# Patient Record
Sex: Male | Born: 1955 | Race: White | Hispanic: No | Marital: Married | Smoking: Never smoker
Health system: Southern US, Community
[De-identification: ages and names within clinical notes are randomized; demographics above are authoritative.]

## PROBLEM LIST (undated history)

## (undated) DIAGNOSIS — N281 Cyst of kidney, acquired: Secondary | ICD-10-CM

## (undated) DIAGNOSIS — E119 Type 2 diabetes mellitus without complications: Secondary | ICD-10-CM

## (undated) DIAGNOSIS — K5730 Diverticulosis of large intestine without perforation or abscess without bleeding: Secondary | ICD-10-CM

## (undated) DIAGNOSIS — J189 Pneumonia, unspecified organism: Secondary | ICD-10-CM

## (undated) DIAGNOSIS — Z992 Dependence on renal dialysis: Secondary | ICD-10-CM

## (undated) DIAGNOSIS — Z87442 Personal history of urinary calculi: Secondary | ICD-10-CM

## (undated) DIAGNOSIS — Z8619 Personal history of other infectious and parasitic diseases: Secondary | ICD-10-CM

## (undated) DIAGNOSIS — K219 Gastro-esophageal reflux disease without esophagitis: Secondary | ICD-10-CM

## (undated) DIAGNOSIS — R351 Nocturia: Secondary | ICD-10-CM

## (undated) DIAGNOSIS — Z960 Presence of urogenital implants: Secondary | ICD-10-CM

## (undated) DIAGNOSIS — I4891 Unspecified atrial fibrillation: Secondary | ICD-10-CM

## (undated) DIAGNOSIS — D649 Anemia, unspecified: Secondary | ICD-10-CM

## (undated) DIAGNOSIS — K760 Fatty (change of) liver, not elsewhere classified: Secondary | ICD-10-CM

## (undated) DIAGNOSIS — G4733 Obstructive sleep apnea (adult) (pediatric): Secondary | ICD-10-CM

## (undated) DIAGNOSIS — C801 Malignant (primary) neoplasm, unspecified: Secondary | ICD-10-CM

## (undated) DIAGNOSIS — N2889 Other specified disorders of kidney and ureter: Secondary | ICD-10-CM

## (undated) DIAGNOSIS — I10 Essential (primary) hypertension: Secondary | ICD-10-CM

## (undated) DIAGNOSIS — R911 Solitary pulmonary nodule: Secondary | ICD-10-CM

## (undated) DIAGNOSIS — I739 Peripheral vascular disease, unspecified: Secondary | ICD-10-CM

## (undated) DIAGNOSIS — K869 Disease of pancreas, unspecified: Secondary | ICD-10-CM

## (undated) DIAGNOSIS — K8020 Calculus of gallbladder without cholecystitis without obstruction: Secondary | ICD-10-CM

## (undated) DIAGNOSIS — I499 Cardiac arrhythmia, unspecified: Secondary | ICD-10-CM

## (undated) DIAGNOSIS — K589 Irritable bowel syndrome without diarrhea: Secondary | ICD-10-CM

## (undated) DIAGNOSIS — M1990 Unspecified osteoarthritis, unspecified site: Secondary | ICD-10-CM

## (undated) DIAGNOSIS — Z86711 Personal history of pulmonary embolism: Secondary | ICD-10-CM

## (undated) DIAGNOSIS — N184 Chronic kidney disease, stage 4 (severe): Secondary | ICD-10-CM

## (undated) DIAGNOSIS — Z9989 Dependence on other enabling machines and devices: Secondary | ICD-10-CM

## (undated) DIAGNOSIS — N186 End stage renal disease: Secondary | ICD-10-CM

## (undated) HISTORY — PX: COLONOSCOPY: SHX174

## (undated) HISTORY — PX: TOE SURGERY: SHX1073

## (undated) HISTORY — PX: CYSTOSCOPY/RETROGRADE/URETEROSCOPY/STONE EXTRACTION WITH BASKET: SHX5317

## (undated) HISTORY — PX: EXTRACORPOREAL SHOCK WAVE LITHOTRIPSY: SHX1557

## (undated) HISTORY — PX: UPPER GI ENDOSCOPY: SHX6162

## (undated) HISTORY — PX: OTHER SURGICAL HISTORY: SHX169

---

## 2008-03-29 DIAGNOSIS — Z86711 Personal history of pulmonary embolism: Secondary | ICD-10-CM

## 2008-03-29 HISTORY — DX: Personal history of pulmonary embolism: Z86.711

## 2017-10-16 ENCOUNTER — Other Ambulatory Visit: Payer: Self-pay | Admitting: Nephrology

## 2017-10-16 DIAGNOSIS — R399 Unspecified symptoms and signs involving the genitourinary system: Secondary | ICD-10-CM

## 2017-10-16 DIAGNOSIS — N183 Chronic kidney disease, stage 3 unspecified: Secondary | ICD-10-CM

## 2017-10-16 DIAGNOSIS — N200 Calculus of kidney: Secondary | ICD-10-CM

## 2017-11-13 ENCOUNTER — Ambulatory Visit
Admission: RE | Admit: 2017-11-13 | Discharge: 2017-11-13 | Disposition: A | Discharge status: Home / Self Care | Payer: PRIVATE HEALTH INSURANCE | Source: Ambulatory Visit | Attending: Nephrology | Admitting: Nephrology

## 2017-11-13 DIAGNOSIS — N183 Chronic kidney disease, stage 3 unspecified: Secondary | ICD-10-CM

## 2017-11-13 DIAGNOSIS — R399 Unspecified symptoms and signs involving the genitourinary system: Secondary | ICD-10-CM

## 2017-11-13 DIAGNOSIS — N200 Calculus of kidney: Secondary | ICD-10-CM

## 2018-03-15 ENCOUNTER — Other Ambulatory Visit: Payer: Self-pay | Admitting: Nephrology

## 2018-03-15 DIAGNOSIS — N183 Chronic kidney disease, stage 3 unspecified: Secondary | ICD-10-CM

## 2018-03-15 DIAGNOSIS — N200 Calculus of kidney: Secondary | ICD-10-CM

## 2018-03-15 DIAGNOSIS — R399 Unspecified symptoms and signs involving the genitourinary system: Secondary | ICD-10-CM

## 2018-03-19 ENCOUNTER — Ambulatory Visit
Admission: RE | Admit: 2018-03-19 | Discharge: 2018-03-19 | Disposition: A | Discharge status: Home / Self Care | Payer: PRIVATE HEALTH INSURANCE | Source: Ambulatory Visit | Attending: Nephrology | Admitting: Nephrology

## 2018-03-19 DIAGNOSIS — N183 Chronic kidney disease, stage 3 unspecified: Secondary | ICD-10-CM

## 2018-03-19 DIAGNOSIS — K8020 Calculus of gallbladder without cholecystitis without obstruction: Secondary | ICD-10-CM

## 2018-03-19 DIAGNOSIS — N200 Calculus of kidney: Secondary | ICD-10-CM

## 2018-03-19 DIAGNOSIS — R399 Unspecified symptoms and signs involving the genitourinary system: Secondary | ICD-10-CM

## 2018-03-19 DIAGNOSIS — K760 Fatty (change of) liver, not elsewhere classified: Secondary | ICD-10-CM

## 2018-03-19 HISTORY — DX: Calculus of gallbladder without cholecystitis without obstruction: K80.20

## 2018-03-19 HISTORY — DX: Fatty (change of) liver, not elsewhere classified: K76.0

## 2018-03-20 ENCOUNTER — Other Ambulatory Visit: Payer: Self-pay | Admitting: Nephrology

## 2018-03-20 DIAGNOSIS — N2889 Other specified disorders of kidney and ureter: Secondary | ICD-10-CM

## 2018-03-23 ENCOUNTER — Ambulatory Visit
Admission: RE | Admit: 2018-03-23 | Discharge: 2018-03-23 | Disposition: A | Discharge status: Home / Self Care | Payer: PRIVATE HEALTH INSURANCE | Source: Ambulatory Visit | Attending: Nephrology | Admitting: Nephrology

## 2018-03-23 DIAGNOSIS — N2889 Other specified disorders of kidney and ureter: Secondary | ICD-10-CM

## 2018-03-23 DIAGNOSIS — N281 Cyst of kidney, acquired: Secondary | ICD-10-CM

## 2018-03-23 HISTORY — DX: Cyst of kidney, acquired: N28.1

## 2018-03-28 ENCOUNTER — Other Ambulatory Visit: Payer: Self-pay | Admitting: Urology

## 2018-03-28 ENCOUNTER — Ambulatory Visit (HOSPITAL_COMMUNITY)
Admission: RE | Admit: 2018-03-28 | Discharge: 2018-03-28 | Disposition: A | Discharge status: Home / Self Care | Payer: PRIVATE HEALTH INSURANCE | Source: Ambulatory Visit | Attending: Urology | Admitting: Urology

## 2018-03-28 DIAGNOSIS — D49511 Neoplasm of unspecified behavior of right kidney: Secondary | ICD-10-CM | POA: Diagnosis not present

## 2018-03-28 DIAGNOSIS — N2889 Other specified disorders of kidney and ureter: Secondary | ICD-10-CM

## 2018-04-02 ENCOUNTER — Other Ambulatory Visit: Payer: Self-pay | Admitting: Urology

## 2018-04-02 DIAGNOSIS — N200 Calculus of kidney: Secondary | ICD-10-CM

## 2018-04-02 DIAGNOSIS — D3001 Benign neoplasm of right kidney: Secondary | ICD-10-CM

## 2018-04-06 ENCOUNTER — Other Ambulatory Visit: Payer: Self-pay | Admitting: Radiology

## 2018-04-09 ENCOUNTER — Ambulatory Visit (HOSPITAL_COMMUNITY)
Admission: RE | Admit: 2018-04-09 | Discharge: 2018-04-09 | Disposition: A | Discharge status: Home / Self Care | Payer: PRIVATE HEALTH INSURANCE | Source: Ambulatory Visit | Attending: Urology | Admitting: Urology

## 2018-04-09 ENCOUNTER — Encounter (HOSPITAL_COMMUNITY): Payer: Self-pay

## 2018-04-09 DIAGNOSIS — Z86711 Personal history of pulmonary embolism: Secondary | ICD-10-CM | POA: Diagnosis not present

## 2018-04-09 DIAGNOSIS — N183 Chronic kidney disease, stage 3 (moderate): Secondary | ICD-10-CM | POA: Insufficient documentation

## 2018-04-09 DIAGNOSIS — N2889 Other specified disorders of kidney and ureter: Secondary | ICD-10-CM | POA: Diagnosis present

## 2018-04-09 DIAGNOSIS — Z79899 Other long term (current) drug therapy: Secondary | ICD-10-CM | POA: Diagnosis not present

## 2018-04-09 DIAGNOSIS — N281 Cyst of kidney, acquired: Secondary | ICD-10-CM | POA: Insufficient documentation

## 2018-04-09 DIAGNOSIS — R911 Solitary pulmonary nodule: Secondary | ICD-10-CM | POA: Diagnosis not present

## 2018-04-09 DIAGNOSIS — K760 Fatty (change of) liver, not elsewhere classified: Secondary | ICD-10-CM | POA: Insufficient documentation

## 2018-04-09 DIAGNOSIS — C641 Malignant neoplasm of right kidney, except renal pelvis: Secondary | ICD-10-CM | POA: Diagnosis not present

## 2018-04-09 DIAGNOSIS — Z880 Allergy status to penicillin: Secondary | ICD-10-CM | POA: Diagnosis not present

## 2018-04-09 DIAGNOSIS — D49511 Neoplasm of unspecified behavior of right kidney: Secondary | ICD-10-CM

## 2018-04-09 DIAGNOSIS — R310 Gross hematuria: Secondary | ICD-10-CM | POA: Insufficient documentation

## 2018-04-09 DIAGNOSIS — K8020 Calculus of gallbladder without cholecystitis without obstruction: Secondary | ICD-10-CM | POA: Insufficient documentation

## 2018-04-09 DIAGNOSIS — K862 Cyst of pancreas: Secondary | ICD-10-CM | POA: Insufficient documentation

## 2018-04-09 DIAGNOSIS — G4733 Obstructive sleep apnea (adult) (pediatric): Secondary | ICD-10-CM | POA: Insufficient documentation

## 2018-04-09 DIAGNOSIS — Z87442 Personal history of urinary calculi: Secondary | ICD-10-CM | POA: Diagnosis not present

## 2018-04-09 DIAGNOSIS — K219 Gastro-esophageal reflux disease without esophagitis: Secondary | ICD-10-CM | POA: Diagnosis not present

## 2018-04-09 DIAGNOSIS — I129 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 kidney disease, or unspecified chronic kidney disease: Secondary | ICD-10-CM | POA: Diagnosis not present

## 2018-04-09 HISTORY — DX: Dependence on other enabling machines and devices: Z99.89

## 2018-04-09 HISTORY — DX: Gastro-esophageal reflux disease without esophagitis: K21.9

## 2018-04-09 HISTORY — DX: Personal history of urinary calculi: Z87.442

## 2018-04-09 HISTORY — DX: Essential (primary) hypertension: I10

## 2018-04-09 HISTORY — DX: Obstructive sleep apnea (adult) (pediatric): G47.33

## 2018-04-09 LAB — CBC
HCT: 39.1 % (ref 39.0–52.0)
Hemoglobin: 12.5 g/dL — ABNORMAL LOW (ref 13.0–17.0)
MCH: 27.7 pg (ref 26.0–34.0)
MCHC: 32 g/dL (ref 30.0–36.0)
MCV: 86.7 fL (ref 78.0–100.0)
PLATELETS: 254 10*3/uL (ref 150–400)
RBC: 4.51 MIL/uL (ref 4.22–5.81)
RDW: 17.2 % — AB (ref 11.5–15.5)
WBC: 12 10*3/uL — ABNORMAL HIGH (ref 4.0–10.5)

## 2018-04-09 LAB — PROTIME-INR
INR: 1.08
PROTHROMBIN TIME: 13.9 s (ref 11.4–15.2)

## 2018-04-09 MED ORDER — MIDAZOLAM HCL 2 MG/2ML IJ SOLN
INTRAMUSCULAR | Status: AC | PRN
  Administered 2018-04-09 (×2): 0.5 mg via INTRAVENOUS
  Administered 2018-04-09: 1 mg via INTRAVENOUS

## 2018-04-09 MED ORDER — FENTANYL CITRATE (PF) 100 MCG/2ML IJ SOLN
INTRAMUSCULAR | Status: AC | PRN
  Administered 2018-04-09: 50 ug via INTRAVENOUS
  Administered 2018-04-09 (×2): 25 ug via INTRAVENOUS

## 2018-04-09 MED ORDER — FENTANYL CITRATE (PF) 100 MCG/2ML IJ SOLN
INTRAMUSCULAR | Status: AC
  Filled 2018-04-09: qty 4

## 2018-04-09 MED ORDER — GELATIN ABSORBABLE 12-7 MM EX MISC
CUTANEOUS | Status: AC
  Filled 2018-04-09: qty 1

## 2018-04-09 MED ORDER — ACETAMINOPHEN 500 MG PO TABS
1000.0000 mg | ORAL_TABLET | Freq: Once | ORAL | Status: AC
  Administered 2018-04-09: 1000 mg via ORAL
  Filled 2018-04-09: qty 2

## 2018-04-09 MED ORDER — LIDOCAINE HCL (PF) 1 % IJ SOLN
INTRAMUSCULAR | Status: AC
  Filled 2018-04-09: qty 30

## 2018-04-09 MED ORDER — SODIUM CHLORIDE 0.9 % IV SOLN: INTRAVENOUS | Status: DC

## 2018-04-09 MED ORDER — MIDAZOLAM HCL 2 MG/2ML IJ SOLN
INTRAMUSCULAR | Status: AC
  Filled 2018-04-09: qty 4

## 2018-04-09 NOTE — Discharge Instructions (Signed)
Percutaneous Kidney Biopsy A kidney biopsy is a procedure to remove small pieces of tissue from a kidney. In a percutaneous biopsy, the tissue is removed using a needle that is inserted through the skin. This procedure is done so that the tissue can be examined under a microscope and checked for disease or infection. Tell a health care provider about:  Any allergies you have.  All medicines you are taking, including vitamins, herbs, eye drops, creams, and over-the-counter medicines.  Any problems you or family members have had with anesthetic medicines.  Any blood disorders you have.  Any surgeries you have had.  Any medical conditions you have.  Whether you are pregnant or may be pregnant. What are the risks? Generally, this is a safe procedure. However, problems may occur, including:  Infection.  Bleeding.  Allergic reactions to medicines.  Damage to other structures or organs.  Swelling from a collection of clotted blood outside a blood vessel (hematoma).  Blood in the urine (hematuria).  What happens before the procedure?  Follow instructions from your health care provider about eating or drinking restrictions.  Ask your health care provider about: ? Changing or stopping your regular medicines. This is especially important if you are taking diabetes medicines or blood thinners. ? Taking medicines such as aspirin and ibuprofen. These medicines can thin your blood. Do not take these medicines before your procedure if your health care provider instructs you not to.  You may be given antibiotic medicine to help prevent infection.  You will have blood and urine samples taken. This is to make sure that you do not have a condition where you should not have a biopsy.  Plan to have someone take you home from the hospital or clinic.  Ask your health care provider how your biopsy site will be marked or identified. What happens during the procedure?  To lower your risk of  infection: ? Your health care team will wash or sanitize their hands. ? Your skin will be washed with soap.  An IV tube will be inserted into one of your veins.  You will be given one or more of the following: ? A medicine to help you relax (sedative). ? A medicine to numb the area (local anesthetic).  You will lie on your abdomen. A firm pillow will be placed under your body to help push the kidneys closer to the surface of the skin. If you have a transplanted kidney, you will lie on your back.  The health care provider will mark the area where the needle will enter your skin.  An imaging test--such as an ultrasound, X-ray, CT scan, or MRI--will be used to locate the kidney. These images will also help the health care provider to guide the biopsy needle into the kidney.  You will be asked to hold your breath and stay still while the health care provider inserts the needle and removes the kidney tissue. ? You will need to hold your breath and stay still for 30-45 seconds. ? During the biopsy, you may hear a popping sound from the needle. ? You may also feel some pressure from the area where the needle is being inserted.  The needle may be inserted and removed 3 or 4 times to make sure that enough tissue is taken for testing.  A bandage (dressing) may be placed over the spot where the needle entered your skin (biopsy site). The procedure may vary among health care providers and hospitals. What happens after the procedure?  Your blood pressure, heart rate, breathing rate, and blood oxygen level will be monitored until the medicines you were given have worn off.  You will need to lie on your back for 6-8 hours.  You may have some pain or soreness near the biopsy site.  You may have pink or cloudy urine from small amounts of blood. This is normal.  You may have grogginess or fatigue if you were given a sedative.  Do not drive for 24 hours if you were given a sedative.  It is up to  you to get the results of your procedure. Ask your health care provider, or the department performing the procedure, when your results will be ready. This information is not intended to replace advice given to you by your health care provider. Make sure you discuss any questions you have with your health care provider. Document Released: 06/25/2004 Document Revised: 05/27/2016 Document Reviewed: 05/27/2016 Elsevier Interactive Patient Education  2018 [place]. Moderate Conscious Sedation, Adult, Care After These instructions provide you with information about caring for yourself after your procedure. Your health care provider may also give you more specific instructions. Your treatment has been planned according to current medical practices, but problems sometimes occur. Call your health care provider if you have any problems or questions after your procedure. What can I expect after the procedure? After your procedure, it is common:  To feel sleepy for several hours.  To feel clumsy and have poor balance for several hours.  To have poor judgment for several hours.  To vomit if you eat too soon.  Follow these instructions at home: For at least 24 hours after the procedure:   Do not: ? Participate in activities where you could fall or become injured. ? Drive. ? Use heavy machinery. ? Drink alcohol. ? Take sleeping pills or medicines that cause drowsiness. ? Make important decisions or sign legal documents. ? Take care of children on your own.  Rest. Eating and drinking  Follow the diet recommended by your health care provider.  If you vomit: ? Drink water, juice, or soup when you can drink without vomiting. ? Make sure you have little or no nausea before eating solid foods. General instructions  Have a responsible adult stay with you until you are awake and alert.  Take over-the-counter and prescription medicines only as told by your health care provider.  If you  smoke, do not smoke without supervision.  Keep all follow-up visits as told by your health care provider. This is important. Contact a health care provider if:  You keep feeling nauseous or you keep vomiting.  You feel light-headed.  You develop a rash.  You have a fever. Get help right away if:  You have trouble breathing. This information is not intended to replace advice given to you by your health care provider. Make sure you discuss any questions you have with your health care provider. Document Released: 06/05/2013 Document Revised: 01/18/2016 Document Reviewed: 12/05/2015 Elsevier Interactive Patient Education  [employer].

## 2018-04-09 NOTE — Procedures (Signed)
Interventional Radiology Procedure Note  Procedure: US guided right kidney mass biopsy.  .  Complications: None Recommendations:  - Ok to shower tomorrow - Do not submerge for 7 days - Routine care   Signed,  Dulcy Fanny. Earleen Newport, DO

## 2018-04-09 NOTE — H&P (Signed)
Chief Complaint: Patient was seen in consultation today for right nal mass biopsy at the request of Bell,Eugene D III  Referring Physician(s): Bell,Eugene D III  Supervising Physician: Sandi Mariscal  Patient Status: [facility] - Out-pt  History of Present Illness: George Lewis is a 63 y.o. male   Known CKD per chart; renal stones multiple times Follows with Dr Leisa Lenz hematuria x months  Referred to Dr Phillis Knack CT 7/22: IMPRESSION: 1. Indeterminate hypodense 9.1 cm renal cortical mass in the posterior upper right kidney. Renal cell carcinoma not excluded. Recommend further evaluation with MRI (preferred) or CT abdomen without and with IV contrast. 2. Distal left ureteral 3 mm stone (located approximately 1.5 cm above the left UVJ). Normal caliber left ureter. No hydronephrosis. 3. Punctate nonobstructing stones in both kidneys. 4. Moderate left colonic diverticulosis. 5. Mild diffuse hepatic steatosis. 6. Solid 2 mm right lower lobe pulmonary nodule. If the right renal mass proves to represent a malignancy, follow-up chest CT would be advised in 3 months. Otherwise, follow-up chest CT could be obtained in 12 months if the patient has risk factors for lung malignancy. 7. Cholelithiasis.  MR 7/26:  IMPRESSION: 1. Large 10.9 x 6.7 x 8.8 cm renal cortical mass in the posterior upper right kidney with irregular outer contour and markedly heterogeneous T2 signal intensity, incompletely characterized on this noncontrast MRI study. This mass is highly suspicious for renal cell carcinoma, which should be considered the diagnosis of exclusion. Percutaneous image guided biopsy may be necessary to establish the diagnosis if the patient's poor renal function (GFR < 30) continues to preclude a contrast-enhanced MRI or CT abdomen without and with IV contrast. 2. No evidence of metastatic disease in the abdomen. 3. Indeterminate small round 1.1 cm structure in the medial pancreatic  neck, incompletely characterized on this noncontrast MRI study. Differential includes a peripancreatic lymph node or solid pancreatic neoplasm. Suggest follow-up noncontrast MRI abdomen in 3-6 months to evaluate stability of this lesion assuming MRI abdomen without and with IV contrast is precluded by poor renal function. 4. Tiny 0.4 cm cystic pancreatic tail lesion without aggressive features. MRI abdomen follow-up suggested in 1 year for this lesion.  Request for right renal mass biopsy   Past Medical History:  Diagnosis Date  . GERD (gastroesophageal reflux disease)   . History of kidney stones   . Hypertension   . Kidney stone   . OSA on CPAP   . PE (pulmonary thromboembolism) ([place])     Past Surgical History:  Procedure Laterality Date  . TOE SURGERY      Allergies: Penicillins  Medications: Prior to Admission medications   Medication Sig Start Date End Date Taking? Authorizing Provider  acetaminophen (TYLENOL) 500 MG tablet Take 1,000 mg by mouth every 8 (eight) hours as needed.   Yes [provider]  allopurinol (ZYLOPRIM) 300 MG tablet Take 300 mg by mouth daily. 03/08/18  Yes [provider]  diphenhydramine-acetaminophen (TYLENOL PM) 25-500 MG TABS tablet Take 3 tablets by mouth at bedtime as needed (pain).   Yes [provider]  omeprazole (PRILOSEC OTC) 20 MG tablet Take 20 mg by mouth daily.   Yes [provider]  Potassium Citrate 15 MEQ (1620 MG) TBCR Take 1 tablet by mouth daily. 03/22/18  Yes [provider]  tamsulosin (FLOMAX) 0.4 MG CAPS capsule Take 0.4 mg by mouth daily. 03/08/18  Yes [provider]     Family History  Problem Relation Age of Onset  .  Alzheimer's disease Mother   . Alzheimer's disease Father   . Heart disease Father     Social History   Socioeconomic History  . Marital status: Married    Spouse name: Not on file  . Number of children: Not on file  . Years of education: Not on  file  . Highest education level: Not on file  Occupational History  . Not on file  Social Needs  . Financial resource strain: Not on file  . Food insecurity:    Worry: Not on file    Inability: Not on file  . Transportation needs:    Medical: Not on file    Non-medical: Not on file  Tobacco Use  . Smoking status: Never Smoker  . Smokeless tobacco: Never Used  Substance and Sexual Activity  . Alcohol use: Never    Frequency: Never  . Drug use: Never  . Sexual activity: Not on file  Lifestyle  . Physical activity:    Days per week: Not on file    Minutes per session: Not on file  . Stress: Not on file  Relationships  . Social connections:    Talks on phone: Not on file    Gets together: Not on file    Attends religious service: Not on file    Active member of club or organization: Not on file    Attends meetings of clubs or organizations: Not on file    Relationship status: Not on file  Other Topics Concern  . Not on file  Social History Narrative  . Not on file     Review of Systems: A 12 point ROS discussed and pertinent positives are indicated in the HPI above.  All other systems are negative.  Review of Systems  Constitutional: Negative for activity change, fatigue and unexpected weight change.  Respiratory: Negative for cough and shortness of breath.   Cardiovascular: Negative for chest pain.  Gastrointestinal: Negative for abdominal pain.  Genitourinary: Positive for hematuria.  Musculoskeletal: Negative for back pain.  Psychiatric/Behavioral: Negative for confusion and decreased concentration.    Vital Signs: BP 138/83   Pulse (!) 106   Temp 98.4 F (36.9 C) (Oral)   Resp 20   Ht 6\' 1"  (1.854 m)   Wt (!) 315 lb (142.9 kg)   SpO2 100%   BMI 41.56 kg/m   Physical Exam  Constitutional: He is oriented to person, place, and time.  Cardiovascular: Normal rate, regular rhythm and normal heart sounds.  Pulmonary/Chest: Effort normal and breath sounds  normal.  Abdominal: Soft. Bowel sounds are normal. There is no tenderness.  Musculoskeletal: Normal range of motion.  Neurological: He is alert and oriented to person, place, and time.  Skin: Skin is warm and dry.  Psychiatric: He has a normal mood and affect. His behavior is normal. Judgment and thought content normal.  Nursing note and vitals reviewed.   Imaging: Ct Abdomen Pelvis Wo Contrast  Result Date: 03/19/2018 CLINICAL DATA:  Chronic kidney disease, stage III. Urinary tract infection symptoms. Gross hematuria. History nephrolithiasis. EXAM: CT ABDOMEN AND PELVIS WITHOUT CONTRAST TECHNIQUE: Multidetector CT imaging of the abdomen and pelvis was performed following the standard protocol without IV contrast. COMPARISON:  11/13/2017 renal sonogram. FINDINGS: Lower chest: Solid 2 mm right lower lobe pulmonary nodule (series 4/image 6). Hepatobiliary: Mild diffuse hepatic steatosis. No definite liver surface irregularity. No liver mass. Cholelithiasis. No gallbladder wall thickening or pericholecystic fluid. No biliary ductal dilatation. Pancreas: Normal, with no mass or duct  dilation. Spleen: Normal size. No mass. Adrenals/Urinary Tract: Normal adrenals. There is a 9.1 x 6.6 cm renal cortical mass with indeterminate density 28 HU (series 2/image 40) in the posterior upper right kidney. Simple 2.1 cm renal cyst in the posterior lower left kidney. No hydronephrosis. Tiny nonobstructing stones in the upper right kidney and scattered throughout left kidney, largest 3 mm in the upper left kidney. Normal caliber ureters. Distal left ureter 3 mm stone located approximately 1.5 cm above the left ureterovesical junction. Normal bladder with no bladder stones. Stomach/Bowel: Normal non-distended stomach. Normal caliber small bowel with no small bowel wall thickening. Normal appendix. Moderate left colonic diverticulosis, most prominent in the sigmoid colon, with no large bowel wall thickening or pericolonic  fat stranding. Vascular/Lymphatic: Normal caliber abdominal aorta. No pathologically enlarged lymph nodes in the abdomen or pelvis. Reproductive: Normal size prostate. Other: No pneumoperitoneum, ascites or focal fluid collection. Musculoskeletal: No aggressive appearing focal osseous lesions. Moderate thoracolumbar spondylosis. IMPRESSION: 1. Indeterminate hypodense 9.1 cm renal cortical mass in the posterior upper right kidney. Renal cell carcinoma not excluded. Recommend further evaluation with MRI (preferred) or CT abdomen without and with IV contrast. 2. Distal left ureteral 3 mm stone (located approximately 1.5 cm above the left UVJ). Normal caliber left ureter. No hydronephrosis. 3. Punctate nonobstructing stones in both kidneys. 4. Moderate left colonic diverticulosis. 5. Mild diffuse hepatic steatosis. 6. Solid 2 mm right lower lobe pulmonary nodule. If the right renal mass proves to represent a malignancy, follow-up chest CT would be advised in 3 months. Otherwise, follow-up chest CT could be obtained in 12 months if the patient has risk factors for lung malignancy. 7. Cholelithiasis. These results will be called to the ordering clinician or representative by the Radiologist Assistant, and communication documented in the PACS or zVision Dashboard. Electronically Signed   By: Ilona Sorrel M.D.   On: 03/19/2018 09:33   Dg Chest 2 View  Result Date: 03/28/2018 CLINICAL DATA:  Right-sided renal cell carcinoma. EXAM: CHEST - 2 VIEW COMPARISON:  None. FINDINGS: The heart size and mediastinal contours are within normal limits. Both lungs are clear. The visualized skeletal structures are unremarkable. IMPRESSION: No active cardiopulmonary disease. Electronically Signed   By: Earle Gell M.D.   On: 03/28/2018 10:30   Mr Abdomen Wo Contrast  Result Date: 03/24/2018 CLINICAL DATA:  Indeterminate upper right renal mass on recent noncontrast CT study. EXAM: MRI ABDOMEN WITHOUT CONTRAST TECHNIQUE: Multiplanar  multisequence MR imaging was performed without the administration of intravenous contrast. COMPARISON:  03/19/2018 unenhanced CT abdomen/pelvis. FINDINGS: Lower chest: No acute abnormality at the lung bases. Hepatobiliary: Normal liver size and configuration. Diffuse hepatic steatosis on chemical shift imaging. There is a tiny 0.3 cm T2 hyperintense lesion in the posterior right liver lobe (series 10/image 29), probably a simple cyst. No additional liver lesions. There is a layering 3 mm gallstone in the nondistended gallbladder, with no gallbladder wall thickening or pericholecystic fluid. No biliary ductal dilatation. Common bile duct diameter 3 mm. No choledocholithiasis. No biliary strictures or masses. Pancreas: Diffuse heterogeneous fatty infiltration of the pancreas. There is a round 1.1 cm structure in the medial pancreatic neck (series 6/image 30), which demonstrates intermediate T1 and T2 signal intensity, incompletely characterized on this noncontrast scan. There is a unilocular 0.4 cm tiny cystic pancreatic tail lesion without appreciable wall thickening or internal septations (series 8/image 27). No pancreatic duct dilation. No pancreas divisum. Spleen: Normal size. No mass. Adrenals/Urinary Tract: Normal adrenals. No hydronephrosis. There  is a large 10.9 x 6.7 x 8.8 cm renal cortical mass in the posterior upper right kidney (series 10/image 42), which demonstrates an irregular outer contour and markedly heterogeneous T2 signal intensity. This mass appears to invade the upper right renal collecting system (series 6/images 44 and 45). Simple appearing 2.1 cm interpolar left renal cyst. Stomach/Bowel: Normal non-distended stomach. Visualized small and large bowel is normal caliber, with no bowel wall thickening. Scattered colonic diverticulosis. Vascular/Lymphatic: Normal caliber abdominal aorta. No pathologically enlarged lymph nodes in the abdomen. Other: No abdominal ascites or focal fluid collection.  Musculoskeletal: No aggressive appearing focal osseous lesions. IMPRESSION: 1. Large 10.9 x 6.7 x 8.8 cm renal cortical mass in the posterior upper right kidney with irregular outer contour and markedly heterogeneous T2 signal intensity, incompletely characterized on this noncontrast MRI study. This mass is highly suspicious for renal cell carcinoma, which should be considered the diagnosis of exclusion. Percutaneous image guided biopsy may be necessary to establish the diagnosis if the patient's poor renal function (GFR < 30) continues to preclude a contrast-enhanced MRI or CT abdomen without and with IV contrast. 2. No evidence of metastatic disease in the abdomen. 3. Indeterminate small round 1.1 cm structure in the medial pancreatic neck, incompletely characterized on this noncontrast MRI study. Differential includes a peripancreatic lymph node or solid pancreatic neoplasm. Suggest follow-up noncontrast MRI abdomen in 3-6 months to evaluate stability of this lesion assuming MRI abdomen without and with IV contrast is precluded by poor renal function. 4. Tiny 0.4 cm cystic pancreatic tail lesion without aggressive features. MRI abdomen follow-up suggested in 1 year for this lesion. This recommendation follows ACR consensus guidelines: Management of Incidental Pancreatic Cysts: A White Paper of the ACR Incidental Findings Committee. J Am Coll Radiol 9326;71:245-809. 5. Diffuse hepatic steatosis. 6. Cholelithiasis. No evidence of acute cholecystitis. No biliary ductal dilatation. No choledocholithiasis. 7. Colonic diverticulosis. These results will be called to the ordering clinician or representative by the Radiologist Assistant, and communication documented in the PACS or zVision Dashboard. Electronically Signed   By: Ilona Sorrel M.D.   On: 03/24/2018 10:18    Labs:  CBC: Recent Labs    04/09/18 0605  WBC 12.0*  HGB 12.5*  HCT 39.1  PLT 254    COAGS: Recent Labs    04/09/18 0605  INR 1.08     BMP: No results for input(s): NA, K, CL, CO2, GLUCOSE, BUN, CALCIUM, CREATININE, GFRNONAA, GFRAA in the last 8760 hours.  Invalid input(s): CMP  LIVER FUNCTION TESTS: No results for input(s): BILITOT, AST, ALT, ALKPHOS, PROT, ALBUMIN in the last 8760 hours.  TUMOR MARKERS: No results for input(s): AFPTM, CEA, CA199, CHROMGRNA in the last 8760 hours.  Assessment and Plan:  CKD III Known renal stones multiple times Persistent gross hematuria Imaging revealing Rt renal mass Now scheduled for renal mass biopsy Risks and benefits discussed with the patient including, but not limited to bleeding, infection, damage to adjacent structures or low yield requiring additional tests.  All of the patient's questions were answered, patient is agreeable to proceed. Consent signed and in chart.    Thank you for this interesting consult.  I greatly enjoyed meeting George Lewis and look forward to participating in their care.  A copy of this report was sent to the requesting provider on this date.  Electronically Signed: Lavonia Drafts, PA-C 04/09/2018, 7:00 AM   I spent a total of  30 Minutes   in face to face in clinical consultation, greater  than 50% of which was counseling/coordinating care for right renal mass bx

## 2018-04-10 DIAGNOSIS — N2889 Other specified disorders of kidney and ureter: Secondary | ICD-10-CM

## 2018-04-10 HISTORY — DX: Other specified disorders of kidney and ureter: N28.89

## 2018-04-13 ENCOUNTER — Other Ambulatory Visit: Payer: Self-pay | Admitting: Urology

## 2018-04-13 ENCOUNTER — Other Ambulatory Visit: Payer: Self-pay

## 2018-04-13 ENCOUNTER — Encounter (HOSPITAL_COMMUNITY): Payer: Self-pay | Admitting: *Deleted

## 2018-04-15 NOTE — H&P (Signed)
CC: I have a renal mass.  HPI: George Lewis is a 62 year-old male patient who is here for a renal mass.  The problem is on the right side. His problem was diagnosed 03/19/2018. His symptoms include flank pain, back pain, and blood in urine. Patient denies having groin pain, nausea, vomiting, fever, and chills. He had the following x-rays done: CT Scan and MRI Scan. He has had kidney surgery.   He has seen blood in his urine. He does have a good appetite. BOWEL HABITS: his bowels are moving normally. He is having pain in new locations. He has not recently had unwanted weight loss.   Patient underwent a CT scan of the abdomen and pelvis without contrast on 03/19/2018. This revealed a complex right renal mass. He subsequently underwent an MRI without contrast due to his renal function. This revealed a 10.9 x 6.7 x 8.8 cm right renal mass highly suspicious for renal cell carcinoma however it was incompletely characterized due to the lack of IV contrast. The patient has been having right-sided flank pain and gross hematuria. He however also has a 3 mm left distal ureteral calculus. He has not seen a stone passed. However, he has been experiencing gross hematuria and not straining his urine. He has no pain on the left side. GFR on 02/07/2018 was 29 and creatinine of 2.37.     ALLERGIES: Penicillin - Hives    MEDICATIONS: Cipro 500 mg tablet  Tamsulosin Hcl 0.4 mg capsule  Allergy Relief  Potassium Citrate  Tylenol     GU PSH: None      PSH Notes: LITHO    NON-GU PSH: Foot surgery (unspecified), Left Hand/finger Surgery, Left, index           GU PMH: Renal calculus      NON-GU PMH: GERD Gout Hypercholesterolemia Hypertension Pulmonary Embolism, History Sleep Apnea      FAMILY HISTORY: 1 Daughter - Other Alzheimer's Disease - Father, Mother Kidney Stones - Sister Thyroid Cancer - Mother     SOCIAL HISTORY: Marital Status: Married Preferred Language: English; Race: White Current  Smoking Status: Patient has never smoked.  <DIV'  Tobacco Use Assessment Completed:  Used Tobacco in last 30 days?   Drinks 4+ caffeinated drinks per day.      REVIEW OF SYSTEMS:       GU Review Male:  Patient reports frequent urination, burning/ pain with urination, get up at night to urinate, and have to strain to urinate . Patient denies hard to postpone urination, leakage of urine, stream starts and stops, trouble starting your stream, erection problems, and penile pain.      Gastrointestinal (Upper):  Patient reports nausea and indigestion/ heartburn. Patient denies vomiting.      Gastrointestinal (Lower):  Patient denies diarrhea and constipation.      Constitutional:  Patient reports fever and fatigue. Patient denies night sweats and weight loss.      Skin:  Patient reports itching. Patient denies skin rash/ lesion.      Eyes:  Patient denies blurred vision and double vision.      Ears/ Nose/ Throat:  Patient denies sore throat and sinus problems.      Hematologic/Lymphatic:  Patient denies swollen glands and easy bruising.      Cardiovascular:  Patient denies leg swelling and chest pains.      Respiratory:  Patient reports shortness of breath. Patient denies cough.      Endocrine:  Patient denies excessive thirst.  Musculoskeletal:  Patient reports back pain and joint pain.       Neurological:  Patient reports dizziness. Patient denies headaches.      Psychologic:  Patient denies depression and anxiety.      Notes: blood in urine UTI weak stream       VITAL SIGNS:         03/28/2018 08:10 AM       Weight 315 lb / 142.88 kg       Height 73 in / 185.42 cm       BP 122/82 mmHg       Heart Rate 118 /min       Temperature 98.0 F / 36.6 C       BMI 41.6 kg/m       MULTI-SYSTEM PHYSICAL EXAMINATION:        Constitutional: Well-nourished. No physical deformities. Normally developed. Good grooming.       Respiratory: No labored breathing, no use of accessory muscles.         Cardiovascular: Normal temperature, adequate perfusion of extremities       Skin: No paleness, no jaundice       Neurologic / Psychiatric: Oriented to time, oriented to place, oriented to person. No depression, no anxiety, no agitation.       Gastrointestinal: No mass, no tenderness, no rigidity, morbidly obese abdomen. No abdominal scars. Small umbilical hernia        Eyes: Normal conjunctivae. Normal eyelids.       Musculoskeletal: Normal gait and station of head and neck.                PAST DATA REVIEWED:   Source Of History:  Patient  Lab Test Review:  BMP  Records Review:  Previous Patient Records  X-Ray Review: C.T. Abdomen/Pelvis: Reviewed Films. Reviewed Report. Discussed With Patient.  MRI Abdomen: Reviewed Films. Reviewed Report. Discussed With Patient.     PROCEDURES:    Urinalysis w/Scope  Dipstick Dipstick Cont'd Micro  Color: Yellow Bilirubin: Neg mg/dL WBC/hpf: >60/hpf  Appearance: Cloudy Ketones: Neg mg/dL RBC/hpf: 10 - 20/hpf  Specific Gravity: 1.025 Blood: 3+ ery/uL Bacteria: Few (10-25/hpf)  pH: <=5.0 Protein: 3+ mg/dL Cystals: NS (Not Seen)  Glucose: Neg mg/dL Urobilinogen: 0.2 mg/dL Casts: NS (Not Seen)   Nitrites: Neg Trichomonas: Not Present   Leukocyte Esterase: 2+ leu/uL Mucous: Not Present    Epithelial Cells: NS (Not Seen)    Yeast: NS (Not Seen)    Sperm: Not Present    ASSESSMENT:     ICD-10 Details  1 GU:  Right renal neoplasm - D49.511   2  Ureteral calculus - N20.1    PLAN:   Orders    Schedule  X-Rays: 1 Week - Interventional Radiology Without Contrast - Please perform CT guided renal mass biopsy. I discussed and reviewed images with Dr. Ronny Bacon. Please refer to him to perform  also please perform a CT of pelvis to ensure stone passage at time of biopsy. Thank you  Return Visit/Planned Activity: ASAP - Consult Refer to physician - Stark Klein, MD  Return Visit/Planned Activity: ASAP - Interventional Radiology, Outside Radiology   Document  Letter(s):  Created for Patient: Clinical Summary   Notes:  I discussed the differential diagnoses with the patient including malignancy which is highly concerning as well as the possibility of a benign lesion. His GFR is 30 and his left kidney is atrophic on CT scan. I discussed with him that nephrectomy has  a good chance of placing him on dialysis. I discussed the implications of this. Also discussed that without contrast, this mass is incompletely characterized. I discussed the option of renal mass biopsy as well as proceeding with laparoscopic nephrectomy. To ensure diagnosis of renal cell carcinoma prior to nephrectomy which would have a good chance of putting him on dialysis, he will proceed with interventional radiology guided percutaneous biopsy of the mass. He also has a distal left ureteral calculus. This will need to be resolved prior to nephrectomy. This can potentially be scanned to ensure passage during his biopsy. I discussed biopsy of the mass with Ronny Bacon and we will plan to proceed with this.   cc: Ronny Bacon, MD   E & M CODE: I spent at least 60 minutes face to face with the patient, more than 50% of that time was spent on counseling and/or coordinating care.   Signed by Link Snuffer, III, M.D. on 04/12/18 at 7:48 PM (EDT)     APPENDED NOTES:  patient also with incompletely characterized pancreatic mass. Will refer ASAP to Dr Barry Dienes    Signed by Link Snuffer, III, M.D. on 04/12/18 at 7:48 PM (EDT

## 2018-04-16 ENCOUNTER — Other Ambulatory Visit: Payer: Self-pay

## 2018-04-16 ENCOUNTER — Telehealth (HOSPITAL_COMMUNITY): Payer: Self-pay | Admitting: *Deleted

## 2018-04-16 ENCOUNTER — Encounter (HOSPITAL_COMMUNITY): Payer: Self-pay | Admitting: Anesthesiology

## 2018-04-16 ENCOUNTER — Ambulatory Visit (HOSPITAL_COMMUNITY): Payer: PRIVATE HEALTH INSURANCE | Admitting: Certified Registered Nurse Anesthetist

## 2018-04-16 ENCOUNTER — Other Ambulatory Visit: Payer: Self-pay | Admitting: Urology

## 2018-04-16 ENCOUNTER — Telehealth: Payer: Self-pay | Admitting: Gastroenterology

## 2018-04-16 ENCOUNTER — Encounter (HOSPITAL_COMMUNITY)
Admission: RE | Disposition: A | Discharge status: Home / Self Care | Payer: Self-pay | Source: Ambulatory Visit | Attending: Urology

## 2018-04-16 ENCOUNTER — Ambulatory Visit (HOSPITAL_COMMUNITY): Payer: PRIVATE HEALTH INSURANCE

## 2018-04-16 ENCOUNTER — Ambulatory Visit (HOSPITAL_COMMUNITY)
Admission: RE | Admit: 2018-04-16 | Discharge: 2018-04-16 | Disposition: A | Discharge status: Home / Self Care | Payer: PRIVATE HEALTH INSURANCE | Source: Ambulatory Visit | Attending: Urology | Admitting: Urology

## 2018-04-16 DIAGNOSIS — G4730 Sleep apnea, unspecified: Secondary | ICD-10-CM | POA: Insufficient documentation

## 2018-04-16 DIAGNOSIS — I10 Essential (primary) hypertension: Secondary | ICD-10-CM

## 2018-04-16 DIAGNOSIS — Z86711 Personal history of pulmonary embolism: Secondary | ICD-10-CM | POA: Insufficient documentation

## 2018-04-16 DIAGNOSIS — N201 Calculus of ureter: Secondary | ICD-10-CM

## 2018-04-16 DIAGNOSIS — Z9989 Dependence on other enabling machines and devices: Secondary | ICD-10-CM | POA: Insufficient documentation

## 2018-04-16 DIAGNOSIS — R109 Unspecified abdominal pain: Secondary | ICD-10-CM

## 2018-04-16 DIAGNOSIS — N3000 Acute cystitis without hematuria: Secondary | ICD-10-CM | POA: Diagnosis not present

## 2018-04-16 DIAGNOSIS — K8689 Other specified diseases of pancreas: Secondary | ICD-10-CM

## 2018-04-16 DIAGNOSIS — N2889 Other specified disorders of kidney and ureter: Secondary | ICD-10-CM

## 2018-04-16 DIAGNOSIS — K219 Gastro-esophageal reflux disease without esophagitis: Secondary | ICD-10-CM

## 2018-04-16 DIAGNOSIS — Z79899 Other long term (current) drug therapy: Secondary | ICD-10-CM

## 2018-04-16 DIAGNOSIS — R112 Nausea with vomiting, unspecified: Secondary | ICD-10-CM

## 2018-04-16 DIAGNOSIS — T83592A Infection and inflammatory reaction due to indwelling ureteral stent, initial encounter: Secondary | ICD-10-CM | POA: Diagnosis not present

## 2018-04-16 HISTORY — DX: Unspecified osteoarthritis, unspecified site: M19.90

## 2018-04-16 HISTORY — PX: CYSTOSCOPY/URETEROSCOPY/HOLMIUM LASER/STENT PLACEMENT: SHX6546

## 2018-04-16 HISTORY — DX: Malignant (primary) neoplasm, unspecified: C80.1

## 2018-04-16 LAB — BASIC METABOLIC PANEL
Anion gap: 13 (ref 5–15)
BUN: 22 mg/dL — AB (ref 6–20)
CALCIUM: 8.9 mg/dL (ref 8.9–10.3)
CO2: 20 mmol/L — ABNORMAL LOW (ref 22–32)
CREATININE: 2.34 mg/dL — AB (ref 0.61–1.24)
Chloride: 108 mmol/L (ref 98–111)
GFR calc Af Amer: 33 mL/min — ABNORMAL LOW (ref >60)
GFR calc non Af Amer: 29 mL/min — ABNORMAL LOW (ref >60)
Glucose, Bld: 113 mg/dL — ABNORMAL HIGH (ref 70–99)
Potassium: 4.6 mmol/L (ref 3.5–5.1)
SODIUM: 141 mmol/L (ref 135–145)

## 2018-04-16 SURGERY — CYSTOSCOPY/URETEROSCOPY/HOLMIUM LASER/STENT PLACEMENT
Anesthesia: General | Laterality: Left

## 2018-04-16 MED ORDER — PROPOFOL 10 MG/ML IV BOLUS
INTRAVENOUS | Status: AC
  Filled 2018-04-16: qty 20

## 2018-04-16 MED ORDER — HYDROMORPHONE HCL 1 MG/ML IJ SOLN: 0.2500 mg | INTRAMUSCULAR | Status: DC | PRN

## 2018-04-16 MED ORDER — LIDOCAINE 2% (20 MG/ML) 5 ML SYRINGE
INTRAMUSCULAR | Status: AC
  Filled 2018-04-16: qty 5

## 2018-04-16 MED ORDER — CIPROFLOXACIN IN D5W 400 MG/200ML IV SOLN
400.0000 mg | Freq: Two times a day (BID) | INTRAVENOUS | Status: DC
  Administered 2018-04-16: 400 mg via INTRAVENOUS
  Filled 2018-04-16: qty 200

## 2018-04-16 MED ORDER — DEXAMETHASONE SODIUM PHOSPHATE 10 MG/ML IJ SOLN
INTRAMUSCULAR | Status: DC | PRN
  Administered 2018-04-16: 10 mg via INTRAVENOUS

## 2018-04-16 MED ORDER — ONDANSETRON HCL 4 MG/2ML IJ SOLN
INTRAMUSCULAR | Status: AC
  Filled 2018-04-16: qty 2

## 2018-04-16 MED ORDER — LIDOCAINE 2% (20 MG/ML) 5 ML SYRINGE
INTRAMUSCULAR | Status: DC | PRN
  Administered 2018-04-16: 100 mg via INTRAVENOUS

## 2018-04-16 MED ORDER — PHENYLEPHRINE 40 MCG/ML (10ML) SYRINGE FOR IV PUSH (FOR BLOOD PRESSURE SUPPORT)
PREFILLED_SYRINGE | INTRAVENOUS | Status: AC
  Filled 2018-04-16: qty 10

## 2018-04-16 MED ORDER — DEXAMETHASONE SODIUM PHOSPHATE 10 MG/ML IJ SOLN
INTRAMUSCULAR | Status: AC
  Filled 2018-04-16: qty 1

## 2018-04-16 MED ORDER — LACTATED RINGERS IV SOLN
INTRAVENOUS | Status: DC
  Administered 2018-04-16: 12:00:00 via INTRAVENOUS

## 2018-04-16 MED ORDER — HYDROCODONE-ACETAMINOPHEN 7.5-325 MG PO TABS
1.0000 | ORAL_TABLET | Freq: Once | ORAL | Status: AC | PRN
  Administered 2018-04-16: 1 via ORAL

## 2018-04-16 MED ORDER — FENTANYL CITRATE (PF) 100 MCG/2ML IJ SOLN
INTRAMUSCULAR | Status: AC
  Filled 2018-04-16: qty 2

## 2018-04-16 MED ORDER — FENTANYL CITRATE (PF) 100 MCG/2ML IJ SOLN
INTRAMUSCULAR | Status: DC | PRN
  Administered 2018-04-16 (×2): 25 ug via INTRAVENOUS
  Administered 2018-04-16 (×2): 50 ug via INTRAVENOUS
  Administered 2018-04-16 (×2): 25 ug via INTRAVENOUS

## 2018-04-16 MED ORDER — PHENYLEPHRINE 40 MCG/ML (10ML) SYRINGE FOR IV PUSH (FOR BLOOD PRESSURE SUPPORT)
PREFILLED_SYRINGE | INTRAVENOUS | Status: DC | PRN
  Administered 2018-04-16: 120 ug via INTRAVENOUS
  Administered 2018-04-16 (×2): 80 ug via INTRAVENOUS
  Administered 2018-04-16: 120 ug via INTRAVENOUS

## 2018-04-16 MED ORDER — IOHEXOL 300 MG/ML  SOLN
INTRAMUSCULAR | Status: DC | PRN
  Administered 2018-04-16: 8 mL via URETHRAL

## 2018-04-16 MED ORDER — MIDAZOLAM HCL 5 MG/5ML IJ SOLN
INTRAMUSCULAR | Status: DC | PRN
  Administered 2018-04-16 (×2): 1 mg via INTRAVENOUS

## 2018-04-16 MED ORDER — MIDAZOLAM HCL 2 MG/2ML IJ SOLN
INTRAMUSCULAR | Status: AC
  Filled 2018-04-16: qty 2

## 2018-04-16 MED ORDER — ONDANSETRON HCL 4 MG/2ML IJ SOLN
INTRAMUSCULAR | Status: DC | PRN
  Administered 2018-04-16: 4 mg via INTRAVENOUS

## 2018-04-16 MED ORDER — PROPOFOL 10 MG/ML IV BOLUS
INTRAVENOUS | Status: DC | PRN
  Administered 2018-04-16: 200 mg via INTRAVENOUS

## 2018-04-16 MED ORDER — MEPERIDINE HCL 50 MG/ML IJ SOLN: 6.2500 mg | INTRAMUSCULAR | Status: DC | PRN

## 2018-04-16 MED ORDER — SODIUM CHLORIDE 0.9 % IR SOLN
Status: DC | PRN
  Administered 2018-04-16: 3000 mL via INTRAVESICAL

## 2018-04-16 MED ORDER — HYDROCODONE-ACETAMINOPHEN 7.5-325 MG PO TABS
ORAL_TABLET | ORAL | Status: AC
  Filled 2018-04-16: qty 1

## 2018-04-16 MED ORDER — ONDANSETRON HCL 4 MG/2ML IJ SOLN: 4.0000 mg | Freq: Once | INTRAMUSCULAR | Status: DC | PRN

## 2018-04-16 MED ORDER — HYDROCODONE-ACETAMINOPHEN 5-325 MG PO TABS: 1.0000 | ORAL_TABLET | ORAL | 0 refills | Status: DC | PRN

## 2018-04-16 SURGICAL SUPPLY — 21 items
BAG URO CATCHER STRL LF (MISCELLANEOUS) ×3 IMPLANT
BASKET LASER NITINOL 1.9FR (BASKET) IMPLANT
BASKET ZERO TIP NITINOL 2.4FR (BASKET) IMPLANT
CATH INTERMIT  6FR 70CM (CATHETERS) ×3 IMPLANT
CATH URET 5FR 28IN CONE TIP (BALLOONS)
CATH URET 5FR 70CM CONE TIP (BALLOONS) IMPLANT
CLOTH BEACON ORANGE TIMEOUT ST (SAFETY) ×3 IMPLANT
EXTRACTOR STONE 1.7FRX115CM (UROLOGICAL SUPPLIES) IMPLANT
FIBER LASER FLEXIVA 365 (UROLOGICAL SUPPLIES) IMPLANT
FIBER LASER TRAC TIP (UROLOGICAL SUPPLIES) IMPLANT
GLOVE BIO SURGEON STRL SZ7.5 (GLOVE) ×3 IMPLANT
GOWN STRL REUS W/TWL XL LVL3 (GOWN DISPOSABLE) ×3 IMPLANT
GUIDEWIRE ANG ZIPWIRE 038X150 (WIRE) IMPLANT
GUIDEWIRE STR DUAL SENSOR (WIRE) ×6 IMPLANT
INFUSOR MANOMETER BAG 3000ML (MISCELLANEOUS) IMPLANT
MANIFOLD NEPTUNE II (INSTRUMENTS) ×3 IMPLANT
PACK CYSTO (CUSTOM PROCEDURE TRAY) ×3 IMPLANT
SHEATH URETERAL 12FRX28CM (UROLOGICAL SUPPLIES) IMPLANT
SHEATH URETERAL 12FRX35CM (MISCELLANEOUS) ×3 IMPLANT
STENT CONTOUR 6FRX26X.038 (STENTS) ×3 IMPLANT
TUBING UROLOGY SET (TUBING) ×3 IMPLANT

## 2018-04-16 NOTE — Telephone Encounter (Signed)
George Lewis, Dr. Gloriann Loan:   Although it's not very common renal cell can certainly spread to pancreas.  I think EUS with FNA is a very good next step.  If you can let him know we'll be getting in touch I'm sure we can get him this week or next.  Thanks   Patty, He needs upper EUS, radial +/- Linear, first available EUS MAC time with myself or Dr. Rush Landmark for abnormal pancreas in setting of renal tumor.  Thanks  [place], [place]

## 2018-04-16 NOTE — Anesthesia Postprocedure Evaluation (Signed)
Anesthesia Post Note  Patient: George Lewis  Procedure(s) Performed: CYSTOSCOPY/LEFT URETEROSCOPY/LEFT RETROGRADE/STENT PLACEMENT (Left )     Patient location during evaluation: PACU Anesthesia Type: General Level of consciousness: awake and alert and oriented Pain management: pain level controlled Vital Signs Assessment: post-procedure vital signs reviewed and stable Respiratory status: spontaneous breathing, nonlabored ventilation and respiratory function stable Cardiovascular status: blood pressure returned to baseline and stable Postop Assessment: no apparent nausea or vomiting Anesthetic complications: no    Last Vitals:  Vitals:   04/16/18 1330 04/16/18 1345  BP: 130/76 131/80  Pulse: (!) 115 (!) 111  Resp: (!) 21 (!) 21  Temp:    SpO2: 100% 96%    Last Pain:  Vitals:   04/16/18 1345  TempSrc:   PainSc: 0-No pain                 Emonte Dieujuste A.

## 2018-04-16 NOTE — Discharge Instructions (Signed)
General Anesthesia, Adult, Care After °These instructions provide you with information about caring for yourself after your procedure. Your health care provider may also give you more specific instructions. Your treatment has been planned according to current medical practices, but problems sometimes occur. Call your health care provider if you have any problems or questions after your procedure. °What can I expect after the procedure? °After the procedure, it is common to have: °· Vomiting. °· A sore throat. °· Mental slowness. ° °It is common to feel: °· Nauseous. °· Cold or shivery. °· Sleepy. °· Tired. °· Sore or achy, even in parts of your body where you did not have surgery. ° °Follow these instructions at home: °For at least 24 hours after the procedure: °· Do not: °? Participate in activities where you could fall or become injured. °? Drive. °? Use heavy machinery. °? Drink alcohol. °? Take sleeping pills or medicines that cause drowsiness. °? Make important decisions or sign legal documents. °? Take care of children on your own. °· Rest. °Eating and drinking °· If you vomit, drink water, juice, or soup when you can drink without vomiting. °· Drink enough fluid to keep your urine clear or pale yellow. °· Make sure you have little or no nausea before eating solid foods. °· Follow the diet recommended by your health care provider. °General instructions °· Have a responsible adult stay with you until you are awake and alert. °· Return to your normal activities as told by your health care provider. Ask your health care provider what activities are safe for you. °· Take over-the-counter and prescription medicines only as told by your health care provider. °· If you smoke, do not smoke without supervision. °· Keep all follow-up visits as told by your health care provider. This is important. °Contact a health care provider if: °· You continue to have nausea or vomiting at home, and medicines are not helpful. °· You  cannot drink fluids or start eating again. °· You cannot urinate after 8-12 hours. °· You develop a skin rash. °· You have fever. °· You have increasing redness at the site of your procedure. °Get help right away if: °· You have difficulty breathing. °· You have chest pain. °· You have unexpected bleeding. °· You feel that you are having a life-threatening or urgent problem. °This information is not intended to replace advice given to you by your health care provider. Make sure you discuss any questions you have with your health care provider. °Document Released: 11/21/2000 Document Revised: 01/18/2016 Document Reviewed: 07/30/2015 °Elsevier Interactive Patient Education © 2018 [employer] ° °

## 2018-04-16 NOTE — Op Note (Signed)
Operative Note  Preoperative diagnosis:  1.  Left ureteral calculus  Postoperative diagnosis: 1.  Possible left ureteral calculus that has passed  Procedure(s): 1.  Cystoscopy with left retrograde pyelogram, left diagnostic ureteroscopy, ureteral stent placement  Surgeon: Link Snuffer, MD  Assistants: None  Anesthesia: General  Complications: None immediate  EBL: Minimal  Specimens: 1.  None  Drains/Catheters: 1.  6x26 double-J ureteral stent  Intraoperative findings: Left retrograde pyelogram was normal.  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  Bladder and urethra were normal.  Indication: 62 year old male recently found to have a large right renal mass as well as a small nonobstructing left ureteral calculus that was confirmed on subsequent CT scan several weeks later presents for the previously mentioned operation prior to proceeding with nephrectomy.  Of note, he has an incidentally found pancreatic mass as well for which he will undergo further evaluation.  Description of procedure:  The patient was identified and consent was obtained.  The patient was taken to the operating room and placed in the supine position.  The patient was placed under general anesthesia.  Perioperative antibiotics were administered.  The patient was placed in dorsal lithotomy.  Patient was prepped and draped in a standard sterile fashion and a timeout was performed.  A 21 French rigid cystoscope was advanced into the urethra and into the bladder.  Complete cystoscopy was performed with no abnormal findings.  The left ureter was cannulated with a sensor wire which was advanced up to the kidney under fluoroscopic guidance.  A semirigid ureteroscope was advanced alongside the wire up to the renal pelvis and no ureteral calculi were seen.  A second wire was advanced through the scope.  The semirigid ureteroscope was removed.  A 12 x 14 access sheath  was passed over 1 of the wires under continuous fluoroscopic guidance without any resistance.  The inner sheath along with the wire were withdrawn.  Flexible ureteroscopy was performed and the entire kidney was inspected.  There were no stones seen.  A retrograde pyelogram was performed with no abnormal findings.  The scope and the access sheath were slowly withdrawn and no ureteral calculus or significant ureteral trauma was seen.  The wire was backloaded onto a cystoscope which was advanced into the bladder.  A 6 x 26 double-J ureteral stent was placed in a standard fashion followed by removal of the wire.  Fluoroscopy confirmed proximal placement and direct visualization confirmed a good coil within the bladder.  The bladder was drained and the scope withdrawn.  This concluded the operation.  Patient tolerated procedure well was stable postoperatively.  Plan: Remove stent in 1 week.  Plan for further evaluation of the pancreatic mass with biopsy prior to proceeding with right nephrectomy.

## 2018-04-16 NOTE — Transfer of Care (Signed)
Immediate Anesthesia Transfer of Care Note  Patient: George Lewis  Procedure(s) Performed: CYSTOSCOPY/LEFT URETEROSCOPY/LEFT RETROGRADE/STENT PLACEMENT (Left )  Patient Location: PACU  Anesthesia Type:General  Level of Consciousness: awake, alert  and oriented  Airway & Oxygen Therapy: Patient Spontanous Breathing and Patient connected to face mask oxygen  Post-op Assessment: Report given to RN and Post -op Vital signs reviewed and stable  Post vital signs: Reviewed and stable  Last Vitals:  Vitals Value Taken Time  BP 150/103 04/16/2018  1:15 PM  Temp    Pulse 120 04/16/2018  1:16 PM  Resp 27 04/16/2018  1:16 PM  SpO2 98 % 04/16/2018  1:16 PM  Vitals shown include unvalidated device data.  Last Pain:  Vitals:   04/16/18 1152  TempSrc:   PainSc: 4       Patients Stated Pain Goal: 4 (41/96/22 [id number])  Complications: No apparent anesthesia complications

## 2018-04-16 NOTE — Interval H&P Note (Signed)
History and Physical Interval Note:  04/16/2018 11:55 AM  George Lewis  has presented today for surgery, with the diagnosis of LEFT URETERAL STONE  The various methods of treatment have been discussed with the patient and family. After consideration of risks, benefits and other options for treatment, the patient has consented to  Procedure(s): LEFT URETEROSCOPY/HOLMIUM LASER/STENT PLACEMENT (Left) as a surgical intervention .  The patient's history has been reviewed, patient examined, no change in status, stable for surgery.  I have reviewed the patient's chart and labs.  Questions were answered to the patient's satisfaction.     Marton Redwood, III

## 2018-04-16 NOTE — Anesthesia Procedure Notes (Signed)
Procedure Name: LMA Insertion Date/Time: 04/16/2018 12:32 PM Performed by: Genelle Bal, CRNA Pre-anesthesia Checklist: Patient identified, Emergency Drugs available, Suction available and Patient being monitored Patient Re-evaluated:Patient Re-evaluated prior to induction Oxygen Delivery Method: Circle system utilized Preoxygenation: Pre-oxygenation with 100% oxygen Induction Type: IV induction Ventilation: Mask ventilation without difficulty LMA: LMA inserted LMA Size: 5.0 Number of attempts: 1 Placement Confirmation: positive ETCO2 Tube secured with: Tape Dental Injury: Teeth and Oropharynx as per pre-operative assessment

## 2018-04-16 NOTE — Telephone Encounter (Signed)
-----   Message from Stark Klein, MD sent at 04/14/2018 10:51 AM EDT ----- Cornelia Copa,  I would say EUS and possible bx if if is solid.    Linna Hoff, what do you think?  If it is a cancer would just do combined surgery and take it out at the same time.    tx FB  ----- Message ----- From: Lucas Mallow, MD Sent: 04/11/2018   5:42 PM EDT To: Stark Klein, MD  Dorris Fetch,  I am going to refer you this man with an MRI that shows a 1.1 cm mass, neoplasm vs lymph node in the pancreas. He has a 10cm right renal cell carcinoma recently biopsy proven and I want to make sure there is nothing on your end that would need to be done at the time. I plan to do a right hand assisted lap nephrectomy.He has poor renal function so cant have contrasted MRI. Theres a good chance a nephrectomy will put him on dialysis. I have not scheduled it yet but please let me know if I need to hold off on scheduling him if something should also be done about the mass vs just observation.  Thanks, Altamese Dilling

## 2018-04-16 NOTE — Anesthesia Preprocedure Evaluation (Addendum)
Anesthesia Evaluation  Patient identified by MRN, date of birth, ID band Patient awake    Reviewed: Allergy & Precautions, NPO status , Patient's Chart, lab work & pertinent test results  Airway Mallampati: III  TM Distance: >3 FB Neck ROM: Full    Dental  (+) Teeth Intact   Pulmonary sleep apnea and Continuous Positive Airway Pressure Ventilation ,    Pulmonary exam normal breath sounds clear to auscultation       Cardiovascular hypertension, Pt. 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   Neuro/Psych negative neurological ROS  negative psych ROS   GI/Hepatic Neg liver ROS, GERD  Medicated and Controlled,  Endo/Other  Morbid obesityGout   Renal/GU Right renal Ca Left ureteral calculus  negative genitourinary   Musculoskeletal  (+) Arthritis , Osteoarthritis,    Abdominal (+) + obese,   Peds  Hematology   Anesthesia Other Findings   Reproductive/Obstetrics                            Anesthesia Physical Anesthesia Plan  ASA: III  Anesthesia Plan: General   Post-op Pain Management:    Induction: Intravenous  PONV Risk Score and Plan: 4 or greater and Ondansetron, Midazolam, Dexamethasone and Treatment may vary due to age or medical condition  Airway Management Planned: LMA  Additional Equipment:   Intra-op Plan:   Post-operative Plan: Extubation in OR  Informed Consent: I have reviewed the patients History and Physical, chart, labs and discussed the procedure including the risks, benefits and alternatives for the proposed anesthesia with the patient or authorized representative who has indicated his/her understanding and acceptance.     Plan Discussed with: CRNA and Surgeon  Anesthesia Plan Comments:         Anesthesia Quick Evaluation

## 2018-04-17 ENCOUNTER — Other Ambulatory Visit: Payer: Self-pay

## 2018-04-17 ENCOUNTER — Encounter (HOSPITAL_COMMUNITY): Payer: Self-pay | Admitting: Urology

## 2018-04-17 DIAGNOSIS — R109 Unspecified abdominal pain: Secondary | ICD-10-CM

## 2018-04-17 DIAGNOSIS — R935 Abnormal findings on diagnostic imaging of other abdominal regions, including retroperitoneum: Secondary | ICD-10-CM

## 2018-04-17 NOTE — Telephone Encounter (Signed)
EUS scheduled for 04/25/18 at 1030 am [facility]  Left message on machine to call back

## 2018-04-18 NOTE — Telephone Encounter (Signed)
EUS scheduled, pt instructed and medications reviewed.  Patient instructions mailed to home.  Patient to call with any questions or concerns.  

## 2018-04-18 NOTE — Telephone Encounter (Signed)
Left message on machine to call back  

## 2018-04-19 ENCOUNTER — Emergency Department (HOSPITAL_COMMUNITY): Payer: PRIVATE HEALTH INSURANCE

## 2018-04-19 ENCOUNTER — Encounter (HOSPITAL_COMMUNITY): Payer: Self-pay

## 2018-04-19 ENCOUNTER — Inpatient Hospital Stay (HOSPITAL_COMMUNITY)
Admission: EM | Admit: 2018-04-19 | Discharge: 2018-04-23 | DRG: 659 | Disposition: A | Discharge status: Home / Self Care | Payer: PRIVATE HEALTH INSURANCE | Attending: Internal Medicine | Admitting: Internal Medicine

## 2018-04-19 ENCOUNTER — Other Ambulatory Visit: Payer: Self-pay

## 2018-04-19 DIAGNOSIS — D649 Anemia, unspecified: Secondary | ICD-10-CM | POA: Diagnosis present

## 2018-04-19 DIAGNOSIS — Z9989 Dependence on other enabling machines and devices: Secondary | ICD-10-CM | POA: Diagnosis not present

## 2018-04-19 DIAGNOSIS — N189 Chronic kidney disease, unspecified: Secondary | ICD-10-CM | POA: Diagnosis present

## 2018-04-19 DIAGNOSIS — R7881 Bacteremia: Secondary | ICD-10-CM

## 2018-04-19 DIAGNOSIS — D72829 Elevated white blood cell count, unspecified: Secondary | ICD-10-CM | POA: Diagnosis not present

## 2018-04-19 DIAGNOSIS — C641 Malignant neoplasm of right kidney, except renal pelvis: Secondary | ICD-10-CM | POA: Diagnosis present

## 2018-04-19 DIAGNOSIS — K869 Disease of pancreas, unspecified: Secondary | ICD-10-CM | POA: Diagnosis present

## 2018-04-19 DIAGNOSIS — N3091 Cystitis, unspecified with hematuria: Secondary | ICD-10-CM | POA: Diagnosis present

## 2018-04-19 DIAGNOSIS — Z960 Presence of urogenital implants: Secondary | ICD-10-CM

## 2018-04-19 DIAGNOSIS — Z8249 Family history of ischemic heart disease and other diseases of the circulatory system: Secondary | ICD-10-CM | POA: Diagnosis not present

## 2018-04-19 DIAGNOSIS — I10 Essential (primary) hypertension: Secondary | ICD-10-CM | POA: Diagnosis not present

## 2018-04-19 DIAGNOSIS — A4181 Sepsis due to Enterococcus: Secondary | ICD-10-CM | POA: Diagnosis present

## 2018-04-19 DIAGNOSIS — G4733 Obstructive sleep apnea (adult) (pediatric): Secondary | ICD-10-CM | POA: Diagnosis not present

## 2018-04-19 DIAGNOSIS — B952 Enterococcus as the cause of diseases classified elsewhere: Secondary | ICD-10-CM | POA: Diagnosis present

## 2018-04-19 DIAGNOSIS — Y838 Other surgical procedures as the cause of abnormal reaction of the patient, or of later complication, without mention of misadventure at the time of the procedure: Secondary | ICD-10-CM | POA: Diagnosis present

## 2018-04-19 DIAGNOSIS — Z86711 Personal history of pulmonary embolism: Secondary | ICD-10-CM

## 2018-04-19 DIAGNOSIS — Z79899 Other long term (current) drug therapy: Secondary | ICD-10-CM

## 2018-04-19 DIAGNOSIS — A419 Sepsis, unspecified organism: Secondary | ICD-10-CM | POA: Diagnosis present

## 2018-04-19 DIAGNOSIS — T83592A Infection and inflammatory reaction due to indwelling ureteral stent, initial encounter: Principal | ICD-10-CM | POA: Diagnosis present

## 2018-04-19 DIAGNOSIS — Z87442 Personal history of urinary calculi: Secondary | ICD-10-CM | POA: Diagnosis not present

## 2018-04-19 DIAGNOSIS — N184 Chronic kidney disease, stage 4 (severe): Secondary | ICD-10-CM | POA: Diagnosis not present

## 2018-04-19 DIAGNOSIS — K8689 Other specified diseases of pancreas: Secondary | ICD-10-CM | POA: Diagnosis not present

## 2018-04-19 DIAGNOSIS — Z880 Allergy status to penicillin: Secondary | ICD-10-CM

## 2018-04-19 DIAGNOSIS — E860 Dehydration: Secondary | ICD-10-CM | POA: Diagnosis present

## 2018-04-19 DIAGNOSIS — I129 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 kidney disease, or unspecified chronic kidney disease: Secondary | ICD-10-CM | POA: Diagnosis present

## 2018-04-19 DIAGNOSIS — K219 Gastro-esophageal reflux disease without esophagitis: Secondary | ICD-10-CM | POA: Diagnosis present

## 2018-04-19 DIAGNOSIS — C649 Malignant neoplasm of unspecified kidney, except renal pelvis: Secondary | ICD-10-CM | POA: Diagnosis not present

## 2018-04-19 DIAGNOSIS — K5730 Diverticulosis of large intestine without perforation or abscess without bleeding: Secondary | ICD-10-CM

## 2018-04-19 DIAGNOSIS — N179 Acute kidney failure, unspecified: Secondary | ICD-10-CM | POA: Diagnosis present

## 2018-04-19 DIAGNOSIS — R509 Fever, unspecified: Secondary | ICD-10-CM | POA: Diagnosis not present

## 2018-04-19 DIAGNOSIS — Z936 Other artificial openings of urinary tract status: Secondary | ICD-10-CM | POA: Diagnosis not present

## 2018-04-19 DIAGNOSIS — N186 End stage renal disease: Secondary | ICD-10-CM | POA: Diagnosis present

## 2018-04-19 DIAGNOSIS — N3000 Acute cystitis without hematuria: Secondary | ICD-10-CM

## 2018-04-19 DIAGNOSIS — N201 Calculus of ureter: Secondary | ICD-10-CM | POA: Diagnosis present

## 2018-04-19 DIAGNOSIS — N390 Urinary tract infection, site not specified: Secondary | ICD-10-CM | POA: Diagnosis present

## 2018-04-19 HISTORY — DX: Presence of urogenital implants: Z96.0

## 2018-04-19 HISTORY — DX: Diverticulosis of large intestine without perforation or abscess without bleeding: K57.30

## 2018-04-19 LAB — COMPREHENSIVE METABOLIC PANEL
ALK PHOS: 76 U/L (ref 38–126)
ALT: 13 U/L (ref 0–44)
ANION GAP: 12 (ref 5–15)
AST: 17 U/L (ref 15–41)
Albumin: 3.5 g/dL (ref 3.5–5.0)
BILIRUBIN TOTAL: 0.6 mg/dL (ref 0.3–1.2)
BUN: 33 mg/dL — ABNORMAL HIGH (ref 6–20)
CALCIUM: 8.9 mg/dL (ref 8.9–10.3)
CO2: 21 mmol/L — AB (ref 22–32)
CREATININE: 2.52 mg/dL — AB (ref 0.61–1.24)
Chloride: 103 mmol/L (ref 98–111)
GFR, EST AFRICAN AMERICAN: 30 mL/min — AB (ref >60)
GFR, EST NON AFRICAN AMERICAN: 26 mL/min — AB (ref >60)
Glucose, Bld: 157 mg/dL — ABNORMAL HIGH (ref 70–99)
Potassium: 4.6 mmol/L (ref 3.5–5.1)
Sodium: 136 mmol/L (ref 135–145)
TOTAL PROTEIN: 7.5 g/dL (ref 6.5–8.1)

## 2018-04-19 LAB — URINALYSIS, ROUTINE W REFLEX MICROSCOPIC
Bilirubin Urine: NEGATIVE
GLUCOSE, UA: 50 mg/dL — AB
Ketones, ur: NEGATIVE mg/dL
NITRITE: NEGATIVE
PH: 6 (ref 5.0–8.0)
Protein, ur: 100 mg/dL — AB
RBC / HPF: >50 RBC/hpf — ABNORMAL HIGH (ref 0–5)
Specific Gravity, Urine: 1.011 (ref 1.005–1.030)

## 2018-04-19 LAB — CBC WITH DIFFERENTIAL/PLATELET
BASOS ABS: 0 10*3/uL (ref 0.0–0.1)
BASOS PCT: 0 %
EOS ABS: 0.3 10*3/uL (ref 0.0–0.7)
Eosinophils Relative: 2 %
HCT: 35.7 % — ABNORMAL LOW (ref 39.0–52.0)
HEMOGLOBIN: 11.6 g/dL — AB (ref 13.0–17.0)
Lymphocytes Relative: 9 %
Lymphs Abs: 1.5 10*3/uL (ref 0.7–4.0)
MCH: 28 pg (ref 26.0–34.0)
MCHC: 32.5 g/dL (ref 30.0–36.0)
MCV: 86.2 fL (ref 78.0–100.0)
MONOS PCT: 12 %
Monocytes Absolute: 2 10*3/uL — ABNORMAL HIGH (ref 0.1–1.0)
NEUTROS PCT: 77 %
Neutro Abs: 12.6 10*3/uL — ABNORMAL HIGH (ref 1.7–7.7)
Platelets: 260 10*3/uL (ref 150–400)
RBC: 4.14 MIL/uL — ABNORMAL LOW (ref 4.22–5.81)
RDW: 16.5 % — ABNORMAL HIGH (ref 11.5–15.5)
WBC: 16.4 10*3/uL — AB (ref 4.0–10.5)

## 2018-04-19 LAB — I-STAT CG4 LACTIC ACID, ED: Lactic Acid, Venous: 1.62 mmol/L (ref 0.5–1.9)

## 2018-04-19 LAB — PROCALCITONIN: Procalcitonin: 1.17 ng/mL

## 2018-04-19 LAB — MRSA PCR SCREENING: MRSA by PCR: NEGATIVE

## 2018-04-19 MED ORDER — ALLOPURINOL 300 MG PO TABS
300.0000 mg | ORAL_TABLET | Freq: Every evening | ORAL | Status: DC
  Administered 2018-04-20 – 2018-04-22 (×3): 300 mg via ORAL
  Filled 2018-04-19 (×2): qty 1
  Filled 2018-04-19: qty 3

## 2018-04-19 MED ORDER — POLYETHYLENE GLYCOL 3350 17 G PO PACK: 17.0000 g | PACK | Freq: Every day | ORAL | Status: DC | PRN

## 2018-04-19 MED ORDER — SORBITOL 70 % SOLN
30.0000 mL | Freq: Every day | Status: DC | PRN
  Filled 2018-04-19: qty 30

## 2018-04-19 MED ORDER — SODIUM CHLORIDE 0.9 % IV SOLN
2.0000 g | Freq: Two times a day (BID) | INTRAVENOUS | Status: DC
  Administered 2018-04-20 – 2018-04-21 (×3): 2 g via INTRAVENOUS
  Filled 2018-04-19 (×3): qty 2

## 2018-04-19 MED ORDER — ONDANSETRON HCL 4 MG/2ML IJ SOLN: 4.0000 mg | Freq: Four times a day (QID) | INTRAMUSCULAR | Status: DC | PRN

## 2018-04-19 MED ORDER — FLUTICASONE PROPIONATE 50 MCG/ACT NA SUSP
2.0000 | Freq: Every day | NASAL | Status: DC | PRN
  Filled 2018-04-19: qty 16

## 2018-04-19 MED ORDER — LORATADINE 10 MG PO TABS
10.0000 mg | ORAL_TABLET | Freq: Every day | ORAL | Status: DC
  Administered 2018-04-20 – 2018-04-23 (×4): 10 mg via ORAL
  Filled 2018-04-19 (×4): qty 1

## 2018-04-19 MED ORDER — ACETAMINOPHEN 500 MG PO TABS
1000.0000 mg | ORAL_TABLET | Freq: Once | ORAL | Status: AC
  Administered 2018-04-19: 1000 mg via ORAL
  Filled 2018-04-19: qty 2

## 2018-04-19 MED ORDER — SODIUM CHLORIDE 0.9% FLUSH
3.0000 mL | Freq: Two times a day (BID) | INTRAVENOUS | Status: DC
  Administered 2018-04-19 – 2018-04-23 (×5): 3 mL via INTRAVENOUS

## 2018-04-19 MED ORDER — SENNA 8.6 MG PO TABS
1.0000 | ORAL_TABLET | Freq: Two times a day (BID) | ORAL | Status: DC
  Administered 2018-04-19 – 2018-04-21 (×3): 8.6 mg via ORAL
  Filled 2018-04-19 (×6): qty 1

## 2018-04-19 MED ORDER — PANTOPRAZOLE SODIUM 40 MG PO TBEC
40.0000 mg | DELAYED_RELEASE_TABLET | Freq: Every day | ORAL | Status: DC
  Administered 2018-04-19 – 2018-04-23 (×5): 40 mg via ORAL
  Filled 2018-04-19 (×5): qty 1

## 2018-04-19 MED ORDER — VANCOMYCIN HCL 10 G IV SOLR
1500.0000 mg | INTRAVENOUS | Status: DC
  Administered 2018-04-20 – 2018-04-22 (×2): 1500 mg via INTRAVENOUS
  Filled 2018-04-19 (×3): qty 1500

## 2018-04-19 MED ORDER — FLEET ENEMA 7-19 GM/118ML RE ENEM: 1.0000 | ENEMA | Freq: Once | RECTAL | Status: DC | PRN

## 2018-04-19 MED ORDER — HYDRALAZINE HCL 20 MG/ML IJ SOLN: 10.0000 mg | Freq: Four times a day (QID) | INTRAMUSCULAR | Status: DC | PRN

## 2018-04-19 MED ORDER — TAMSULOSIN HCL 0.4 MG PO CAPS
0.4000 mg | ORAL_CAPSULE | Freq: Every evening | ORAL | Status: DC
  Administered 2018-04-19 – 2018-04-22 (×4): 0.4 mg via ORAL
  Filled 2018-04-19 (×4): qty 1

## 2018-04-19 MED ORDER — OMEPRAZOLE MAGNESIUM 20 MG PO TBEC: 40.0000 mg | DELAYED_RELEASE_TABLET | Freq: Every day | ORAL | Status: DC

## 2018-04-19 MED ORDER — ACETAMINOPHEN 325 MG PO TABS
650.0000 mg | ORAL_TABLET | Freq: Four times a day (QID) | ORAL | Status: DC | PRN
  Administered 2018-04-19 – 2018-04-23 (×4): 650 mg via ORAL
  Filled 2018-04-19 (×4): qty 2

## 2018-04-19 MED ORDER — ONDANSETRON HCL 4 MG PO TABS: 4.0000 mg | ORAL_TABLET | Freq: Four times a day (QID) | ORAL | Status: DC | PRN

## 2018-04-19 MED ORDER — SODIUM CHLORIDE 0.9 % IV SOLN
INTRAVENOUS | Status: DC
  Administered 2018-04-19 – 2018-04-20 (×3): via INTRAVENOUS

## 2018-04-19 MED ORDER — HYDROCODONE-ACETAMINOPHEN 5-325 MG PO TABS
1.0000 | ORAL_TABLET | ORAL | Status: DC | PRN
  Administered 2018-04-23: 1 via ORAL
  Filled 2018-04-19: qty 1

## 2018-04-19 MED ORDER — SODIUM CHLORIDE 0.9 % IV SOLN
2.0000 g | Freq: Once | INTRAVENOUS | Status: AC
  Administered 2018-04-19: 2 g via INTRAVENOUS
  Filled 2018-04-19: qty 2

## 2018-04-19 MED ORDER — ACETAMINOPHEN 650 MG RE SUPP: 650.0000 mg | Freq: Four times a day (QID) | RECTAL | Status: DC | PRN

## 2018-04-19 MED ORDER — SODIUM CHLORIDE 0.9 % IV SOLN: 2.0000 g | Freq: Once | INTRAVENOUS | Status: DC

## 2018-04-19 MED ORDER — VANCOMYCIN HCL 10 G IV SOLR
2000.0000 mg | Freq: Once | INTRAVENOUS | Status: AC
  Administered 2018-04-19: 2000 mg via INTRAVENOUS
  Filled 2018-04-19: qty 2000

## 2018-04-19 MED ORDER — LACTATED RINGERS IV BOLUS
1000.0000 mL | Freq: Once | INTRAVENOUS | Status: AC
  Administered 2018-04-19: 1000 mL via INTRAVENOUS

## 2018-04-19 NOTE — ED Notes (Signed)
ED TO INPATIENT HANDOFF REPORT  Name/Age/Gender George Lewis 62 y.o. male  Code Status   Home/SNF/Other Home  Chief Complaint fever  Level of Care/Admitting Diagnosis ED Disposition    ED Disposition Condition [facility]: [place] [[id number]]  Level of Care: Stepdown [14]  Admit to SDU based on following criteria: Severe physiological/psychological symptoms:  Any diagnosis requiring assessment & intervention at least every 4 hours on an ongoing basis to obtain desired patient outcomes including stability and rehabilitation  Admit to SDU based on following criteria: Hemodynamic compromise or significant risk of instability:  Patient requiring short term acute titration and management of vasoactive drips, and invasive monitoring (i.e., CVP and Arterial line).  Diagnosis: Sepsis [facility]) [[id number]]  Admitting Physician: Eugenie Filler [place]  Attending Physician: Eugenie Filler [[id number]]  Estimated length of stay: past midnight tomorrow  Certification:: I certify this patient will need inpatient services for at least 2 midnights  PT Class (Do Not Modify): Inpatient [101]  PT Acc Code (Do Not Modify): Private [1]       Medical History Past Medical History:  Diagnosis Date  . Arthritis    knees  . Cancer [facility])    right kidney new dx tuesday 04-10-18  . GERD (gastroesophageal reflux disease)    hx of   . History of kidney stones   . Hypertension    off bp med lisinopril since june or july  . OSA on CPAP   . PE (pulmonary thromboembolism) ([place]) 03/2008    Allergies Allergies  Allergen Reactions  . Penicillins Rash and Other (See Comments)    Has patient had a PCN reaction causing immediate rash, facial/tongue/throat swelling, SOB or lightheadedness with hypotension: yes Has patient had a PCN reaction causing severe rash involving mucus membranes or skin necrosis: no Has patient had a PCN reaction that required hospitalization:  no Has patient had a PCN reaction occurring within the last 10 years: no If all of the above answers are "NO", then may proceed with Cephalosporin use.     IV Location/Drains/Wounds Patient Lines/Drains/Airways Status   Active Line/Drains/Airways    Name:   Placement date:   Placement time:   Site:   Days:   Peripheral IV 04/19/18 Left Hand   04/19/18    1533    Hand   less than 1   Ureteral Drain/Stent Left ureter 6 Fr.   04/16/18    1258    Left ureter   3   Incision (Closed) 04/09/18 Back Right   04/09/18    0919     10          Labs/Imaging Results for orders placed or performed during the hospital encounter of 04/19/18 (from the past 48 hour(s))  I-Stat CG4 Lactic Acid, ED  (not at  [facility])     Status: None   Collection Time: 04/19/18  3:35 PM  Result Value Ref Range   Lactic Acid, Venous 1.62 0.5 - 1.9 mmol/L  Comprehensive metabolic panel     Status: Abnormal   Collection Time: 04/19/18  3:44 PM  Result Value Ref Range   Sodium 136 135 - 145 mmol/L   Potassium 4.6 3.5 - 5.1 mmol/L   Chloride 103 98 - 111 mmol/L   CO2 21 (L) 22 - 32 mmol/L   Glucose, Bld 157 (H) 70 - 99 mg/dL   BUN 33 (H) 6 - 20 mg/dL   Creatinine, Ser 2.52 (H) 0.61 -  1.24 mg/dL   Calcium 8.9 8.9 - 10.3 mg/dL   Total Protein 7.5 6.5 - 8.1 g/dL   Albumin 3.5 3.5 - 5.0 g/dL   AST 17 15 - 41 U/L   ALT 13 0 - 44 U/L   Alkaline Phosphatase 76 38 - 126 U/L   Total Bilirubin 0.6 0.3 - 1.2 mg/dL   GFR calc non Af Amer 26 (L) >60 mL/min   GFR calc Af Amer 30 (L) >60 mL/min    Comment: (NOTE) The eGFR has been calculated using the CKD EPI equation. This calculation has not been validated in all clinical situations. eGFR's persistently <60 mL/min signify possible Chronic Kidney Disease.    Anion gap 12 5 - 15    Comment: Performed at [facility], [place] [address] [place], [place] [address]  CBC WITH DIFFERENTIAL     Status: Abnormal   Collection Time: 04/19/18  3:44 PM  Result Value  Ref Range   WBC 16.4 (H) 4.0 - 10.5 K/uL   RBC 4.14 (L) 4.22 - 5.81 MIL/uL   Hemoglobin 11.6 (L) 13.0 - 17.0 g/dL   HCT 35.7 (L) 39.0 - 52.0 %   MCV 86.2 78.0 - 100.0 fL   MCH 28.0 26.0 - 34.0 pg   MCHC 32.5 30.0 - 36.0 g/dL   RDW 16.5 (H) 11.5 - 15.5 %   Platelets 260 150 - 400 K/uL   Neutrophils Relative % 77 %   Neutro Abs 12.6 (H) 1.7 - 7.7 K/uL   Lymphocytes Relative 9 %   Lymphs Abs 1.5 0.7 - 4.0 K/uL   Monocytes Relative 12 %   Monocytes Absolute 2.0 (H) 0.1 - 1.0 K/uL   Eosinophils Relative 2 %   Eosinophils Absolute 0.3 0.0 - 0.7 K/uL   Basophils Relative 0 %   Basophils Absolute 0.0 0.0 - 0.1 K/uL    Comment: Performed at [facility], [place] [address] [place], [place] [address]  Urinalysis, Routine w reflex microscopic     Status: Abnormal   Collection Time: 04/19/18  6:01 PM  Result Value Ref Range   Color, Urine YELLOW YELLOW   APPearance CLEAR CLEAR   Specific Gravity, Urine 1.011 1.005 - 1.030   pH 6.0 5.0 - 8.0   Glucose, UA 50 (A) NEGATIVE mg/dL   Hgb urine dipstick MODERATE (A) NEGATIVE   Bilirubin Urine NEGATIVE NEGATIVE   Ketones, ur NEGATIVE NEGATIVE mg/dL   Protein, ur 100 (A) NEGATIVE mg/dL   Nitrite NEGATIVE NEGATIVE   Leukocytes, UA LARGE (A) NEGATIVE   RBC / HPF >50 (H) 0 - 5 RBC/hpf   WBC, UA >50 (H) 0 - 5 WBC/hpf   Bacteria, UA RARE (A) NONE SEEN   WBC Clumps PRESENT    Mucus PRESENT    Budding Yeast PRESENT     Comment: Performed at [facility], [place] [address], [place], [place] [address]   Ct Abdomen Pelvis Wo Contrast  Result Date: 04/19/2018 CLINICAL DATA:  Fever. LEFT-sided pain. LEFT-sided stent placed on Monday. Recent diagnosis of RIGHT renal cancer and possible metastasis to pancreas. Pyelonephritis, complicated. EXAM: CT ABDOMEN AND PELVIS WITHOUT CONTRAST TECHNIQUE: Multidetector CT imaging of the abdomen and pelvis was performed following the standard protocol without IV contrast. COMPARISON:  CT  abdomen dated 04/12/2018. FINDINGS: Lower chest: No acute abnormality. Hepatobiliary: No focal liver abnormality is seen. No gallstones, gallbladder wall thickening, or biliary dilatation. Pancreas: Infiltrated with fat but otherwise unremarkable. Spleen: Normal in  size without focal abnormality. Adrenals/Urinary Tract: LEFT-sided nephroureteral stent appears appropriately positioned with proximal tip at the level of the LEFT renal pelvis and distal tip within the bladder. LEFT kidney otherwise unremarkable. The large RIGHT renal mass is unchanged in the short-term interval, measured on today's exam it 10 x 7 cm. Again noted is adjacent retroperitoneal hemorrhage, not significantly changed compared to the previous study. The associated soft tissue gas within the collection is compatible with the given history of recent percutaneous needle biopsy. No new perinephric fluid. No RIGHT-sided hydronephrosis or hydroureter. Bladder is unremarkable. Stomach/Bowel: Bowel is normal in caliber. No bowel wall thickening or evidence of bowel wall inflammation seen. Diverticulosis again noted within the upper sigmoid colon and descending colon but no focal inflammatory change to suggest acute diverticulitis. Stomach is unremarkable. Vascular/Lymphatic: No abdominal aortic aneurysm. No pathologically enlarged lymph nodes identified. Reproductive: Prostate is unremarkable. Other: No new intra-abdominal or intrapelvic fluid collection. No evidence of intraperitoneal abscess. Musculoskeletal: No acute or suspicious osseous finding. IMPRESSION: 1. No significant change compared to the CT abdomen and pelvis of 04/12/2018. Again noted is RIGHT-sided retroperitoneal hemorrhage adjacent to the large RIGHT renal mass, with some associated soft tissue gas, all of which is again compatible with sequela of recent percutaneous needle biopsy. The retroperitoneal hemorrhage is not significantly changed in size or extent. No new perinephric fluid  or inflammation to suggest concomitant pyelonephritis. 2. LEFT kidney is unremarkable, also without CT evidence of pyelonephritis. The LEFT-sided nephroureteral stent appears well positioned. No evidence of procedural complicating feature. 3. Colonic diverticulosis without evidence of acute diverticulitis. Electronically Signed   By: Franki Cabot M.D.   On: 04/19/2018 16:48   Dg Chest Port 1 View  Result Date: 04/19/2018 CLINICAL DATA:  Fever today, recent LEFT ureteral stent placement EXAM: PORTABLE CHEST 1 VIEW COMPARISON:  Portable exam 1524 hours compared to 03/28/2018 FINDINGS: Enlargement of cardiac silhouette. Mediastinal contours and pulmonary vascularity normal. Low lung volumes. No gross infiltrate, pleural effusion or pneumothorax. Osseous structures unremarkable. IMPRESSION: Enlargement of cardiac silhouette. No acute abnormalities. Electronically Signed   By: Lavonia Dana M.D.   On: 04/19/2018 15:49    Pending Labs Unresulted Labs (From admission, onward)    Start     Ordered   04/20/18 0500  Procalcitonin  Daily,   R     04/19/18 1748   04/19/18 1749  Procalcitonin - Baseline  STAT,   STAT     04/19/18 1748   04/19/18 1455  Urine culture  STAT,   STAT     04/19/18 1456   04/19/18 1454  Blood Culture (routine x 2)  BLOOD CULTURE X 2,   STAT     04/19/18 1456          Vitals/Pain Today's Vitals   04/19/18 1758 04/19/18 1800 04/19/18 1802 04/19/18 1802  BP:  140/72    Pulse:  (!) 113    Resp:      Temp: (!) 100.7 F (38.2 C)     TempSrc: Oral     SpO2:  98%    Weight:      Height:      PainSc:   6  6     Isolation Precautions No active isolations  Medications Medications  lactated ringers bolus 1,000 mL (has no administration in time range)  acetaminophen (TYLENOL) tablet 1,000 mg (1,000 mg Oral Given 04/19/18 1534)  ceFEPIme (MAXIPIME) 2 g in sodium chloride 0.9 % 100 mL IVPB (0 g Intravenous Stopped  04/19/18 1618)  vancomycin (VANCOCIN) 2,000 mg in sodium  chloride 0.9 % 500 mL IVPB (2,000 mg Intravenous New Bag/Given 04/19/18 1618)    Mobility walks

## 2018-04-19 NOTE — ED Provider Notes (Addendum)
[facility] DEPT Provider Note   CSN: [id number] Arrival date & time: 04/19/18  1426     History   Chief Complaint Chief Complaint  Patient presents with  . Fever    HPI George Lewis is a 62 y.o. male.  Patient with recent left-sided ureteral stent placed for possible kidney stone.  Not on antibiotics.  Worsening left-sided flank pain and pain with urination.  No cough, no sputum production.  Fever started today.  Has taken Tylenol.  The history is provided by the patient.  Fever   This is a new problem. The current episode started 3 to 5 hours ago. The problem occurs constantly. The problem has not changed since onset.The maximum temperature noted was 103 to 104 F. Pertinent negatives include no chest pain, no fussiness, no sleepiness, no diarrhea, no vomiting, no congestion, no headaches, no sore throat, no tugging at ear, no muscle aches and no cough. He has tried acetaminophen for the symptoms. The treatment provided mild relief.    Past Medical History:  Diagnosis Date  . Arthritis    knees  . Cancer [facility])    right kidney new dx tuesday 04-10-18  . GERD (gastroesophageal reflux disease)    hx of   . History of kidney stones   . Hypertension    off bp med lisinopril since june or july  . OSA on CPAP   . PE (pulmonary thromboembolism) ([name]) 03/2008    Patient Active Problem List   Diagnosis Date Noted  . Sepsis ([place]) 04/19/2018    Past Surgical History:  Procedure Laterality Date  . CYSTOSCOPY/RETROGRADE/URETEROSCOPY/STONE EXTRACTION WITH BASKET     x2  . CYSTOSCOPY/URETEROSCOPY/HOLMIUM LASER/STENT PLACEMENT Left 04/16/2018   Procedure: CYSTOSCOPY/LEFT URETEROSCOPY/LEFT RETROGRADE/STENT PLACEMENT;  Surgeon: Lucas Mallow, MD;  Location: [facility];  Service: Urology;  Laterality: Left;  . EXTRACORPOREAL SHOCK WAVE LITHOTRIPSY     x 3  . left index finger attachment  18-19 yrs ago   3-4 surgeries  . TOE SURGERY  12-14 yrs ago     in beween 2nd and 3rd toes cyst removed        Home Medications    Prior to Admission medications   Medication Sig Start Date End Date Taking? Authorizing Provider  acetaminophen (TYLENOL) 500 MG tablet Take 500-1,000 mg by mouth every 8 (eight) hours as needed for mild pain.    Yes [provider]  allopurinol (ZYLOPRIM) 300 MG tablet Take 300 mg by mouth every evening.  03/08/18  Yes [provider]  diphenhydramine-acetaminophen (TYLENOL PM) 25-500 MG TABS tablet Take 3 tablets by mouth at bedtime as needed (pain).   Yes [provider]  diphenhydramine-acetaminophen (TYLENOL PM) 25-500 MG TABS tablet Take 1 tablet by mouth at bedtime as needed (sleep).   Yes [provider]  fluticasone (FLONASE) 50 MCG/ACT nasal spray Place 2 sprays into both nostrils daily as needed for allergies or rhinitis.   Yes [provider]  HYDROcodone-acetaminophen (NORCO/VICODIN) 5-325 MG tablet Take 1 tablet by mouth every 4 (four) hours as needed for moderate pain. 04/16/18 04/16/19 Yes Marton Redwood III, MD  loratadine (CLARITIN) 10 MG tablet Take 10 mg by mouth daily.   Yes [provider]  omeprazole (PRILOSEC OTC) 20 MG tablet Take 40 mg by mouth daily.    Yes [provider]  Potassium Citrate 15 MEQ (1620 MG) TBCR Take 1 tablet by mouth every evening.  03/22/18  Yes [provider]  tamsulosin (FLOMAX) 0.4 MG CAPS capsule Take 0.4 mg by mouth every evening.  03/08/18  Yes [provider]    Family History Family History  Problem Relation Age of Onset  . Alzheimer's disease Mother   . Alzheimer's disease Father   . Heart disease Father     Social History Social History   Tobacco Use  . Smoking status: Never Smoker  . Smokeless tobacco: Never Used  Substance Use Topics  . Alcohol use: Never    Frequency: Never  . Drug use: Never     Allergies   Penicillins   Review of Systems Review of Systems   Constitutional: Positive for fever. Negative for chills.  HENT: Negative for congestion, ear pain and sore throat.   Eyes: Negative for pain and visual disturbance.  Respiratory: Negative for cough and shortness of breath.   Cardiovascular: Negative for chest pain and palpitations.  Gastrointestinal: Negative for abdominal pain, diarrhea and vomiting.  Genitourinary: Positive for dysuria, hematuria and urgency.  Musculoskeletal: Negative for arthralgias and back pain.  Skin: Negative for color change and rash.  Neurological: Negative for seizures, syncope and headaches.  All other systems reviewed and are negative.    Physical Exam Updated Vital Signs  ED Triage Vitals  Enc Vitals Group     BP 04/19/18 1433 (!) 149/89     Pulse Rate 04/19/18 1433 (!) 135     Resp 04/19/18 1433 (!) 22     Temp 04/19/18 1433 (!) 102.8 F (39.3 C)     Temp Source 04/19/18 1433 Oral     SpO2 04/19/18 1433 99 %     Weight 04/19/18 1434 (!) 320 lb (145.2 kg)     Height 04/19/18 1434 6' 1.5" (1.867 m)     Head Circumference --      Peak Flow --      Pain Score 04/19/18 1443 7     Pain Loc --      Pain Edu? --      Excl. in [place]? --     Physical Exam  Constitutional: He is oriented to person, place, and time. He appears well-developed and well-nourished.  HENT:  Head: Normocephalic and atraumatic.  Mouth/Throat: No oropharyngeal exudate.  Eyes: Pupils are equal, round, and reactive to light. Conjunctivae and EOM are normal.  Neck: Normal range of motion. Neck supple.  Cardiovascular: Regular rhythm, normal heart sounds and intact distal pulses. Tachycardia present.  No murmur heard. Pulmonary/Chest: Effort normal and breath sounds normal. No stridor. No respiratory distress.  Abdominal: Soft. He exhibits no distension. There is no tenderness.  Musculoskeletal: Normal range of motion. He exhibits tenderness (TTP in left flank). He exhibits no edema.  Neurological: He is alert and oriented to  person, place, and time.  Skin: Skin is warm and dry. Capillary refill takes less than 2 seconds.  Psychiatric: He has a normal mood and affect.  Nursing note and vitals reviewed.    ED Treatments / Results  Labs (all labs ordered are listed, but only abnormal results are displayed) Labs Reviewed  COMPREHENSIVE METABOLIC PANEL - Abnormal; Notable for the following components:      Result Value   CO2 21 (*)    Glucose, Bld 157 (*)    BUN 33 (*)    Creatinine, Ser 2.52 (*)    GFR calc non Af Amer 26 (*)    GFR calc Af Amer 30 (*)    All other components within normal limits  CBC  WITH DIFFERENTIAL/PLATELET - Abnormal; Notable for the following components:   WBC 16.4 (*)    RBC 4.14 (*)    Hemoglobin 11.6 (*)    HCT 35.7 (*)    RDW 16.5 (*)    Neutro Abs 12.6 (*)    Monocytes Absolute 2.0 (*)    All other components within normal limits  URINALYSIS, ROUTINE W REFLEX MICROSCOPIC - Abnormal; Notable for the following components:   Glucose, UA 50 (*)    Hgb urine dipstick MODERATE (*)    Protein, ur 100 (*)    Leukocytes, UA LARGE (*)    RBC / HPF >50 (*)    WBC, UA >50 (*)    Bacteria, UA RARE (*)    All other components within normal limits  CULTURE, BLOOD (ROUTINE X 2)  CULTURE, BLOOD (ROUTINE X 2)  URINE CULTURE  PROCALCITONIN  PROCALCITONIN  I-STAT CG4 LACTIC ACID, ED  I-STAT CG4 LACTIC ACID, ED    EKG EKG: Sinus tachycardia with no signs of ischemic changes.  Normal intervals.   Radiology Ct Abdomen Pelvis Wo Contrast  Result Date: 04/19/2018 CLINICAL DATA:  Fever. LEFT-sided pain. LEFT-sided stent placed on Monday. Recent diagnosis of RIGHT renal cancer and possible metastasis to pancreas. Pyelonephritis, complicated. EXAM: CT ABDOMEN AND PELVIS WITHOUT CONTRAST TECHNIQUE: Multidetector CT imaging of the abdomen and pelvis was performed following the standard protocol without IV contrast. COMPARISON:  CT abdomen dated 04/12/2018. FINDINGS: Lower chest: No acute  abnormality. Hepatobiliary: No focal liver abnormality is seen. No gallstones, gallbladder wall thickening, or biliary dilatation. Pancreas: Infiltrated with fat but otherwise unremarkable. Spleen: Normal in size without focal abnormality. Adrenals/Urinary Tract: LEFT-sided nephroureteral stent appears appropriately positioned with proximal tip at the level of the LEFT renal pelvis and distal tip within the bladder. LEFT kidney otherwise unremarkable. The large RIGHT renal mass is unchanged in the short-term interval, measured on today's exam it 10 x 7 cm. Again noted is adjacent retroperitoneal hemorrhage, not significantly changed compared to the previous study. The associated soft tissue gas within the collection is compatible with the given history of recent percutaneous needle biopsy. No new perinephric fluid. No RIGHT-sided hydronephrosis or hydroureter. Bladder is unremarkable. Stomach/Bowel: Bowel is normal in caliber. No bowel wall thickening or evidence of bowel wall inflammation seen. Diverticulosis again noted within the upper sigmoid colon and descending colon but no focal inflammatory change to suggest acute diverticulitis. Stomach is unremarkable. Vascular/Lymphatic: No abdominal aortic aneurysm. No pathologically enlarged lymph nodes identified. Reproductive: Prostate is unremarkable. Other: No new intra-abdominal or intrapelvic fluid collection. No evidence of intraperitoneal abscess. Musculoskeletal: No acute or suspicious osseous finding. IMPRESSION: 1. No significant change compared to the CT abdomen and pelvis of 04/12/2018. Again noted is RIGHT-sided retroperitoneal hemorrhage adjacent to the large RIGHT renal mass, with some associated soft tissue gas, all of which is again compatible with sequela of recent percutaneous needle biopsy. The retroperitoneal hemorrhage is not significantly changed in size or extent. No new perinephric fluid or inflammation to suggest concomitant pyelonephritis. 2.  LEFT kidney is unremarkable, also without CT evidence of pyelonephritis. The LEFT-sided nephroureteral stent appears well positioned. No evidence of procedural complicating feature. 3. Colonic diverticulosis without evidence of acute diverticulitis. Electronically Signed   By: Franki Cabot M.D.   On: 04/19/2018 16:48   Dg Chest Port 1 View  Result Date: 04/19/2018 CLINICAL DATA:  Fever today, recent LEFT ureteral stent placement EXAM: PORTABLE CHEST 1 VIEW COMPARISON:  Portable exam 1524 hours compared to  03/28/2018 FINDINGS: Enlargement of cardiac silhouette. Mediastinal contours and pulmonary vascularity normal. Low lung volumes. No gross infiltrate, pleural effusion or pneumothorax. Osseous structures unremarkable. IMPRESSION: Enlargement of cardiac silhouette. No acute abnormalities. Electronically Signed   By: Lavonia Dana M.D.   On: 04/19/2018 15:49    Procedures .Critical Care Performed by: Lennice Sites, DO Authorized by: Lennice Sites, DO   Critical care provider statement:    Critical care time (minutes):  45   Critical care time was exclusive of:  Separately billable procedures and treating other patients and teaching time   Critical care was necessary to treat or prevent imminent or life-threatening deterioration of the following conditions:  Sepsis   Critical care was time spent personally by me on the following activities:  Development of treatment plan with patient or surrogate, discussions with consultants, discussions with primary provider, evaluation of patient's response to treatment, examination of patient, ordering and performing treatments and interventions, ordering and review of laboratory studies, ordering and review of radiographic studies, pulse oximetry, re-evaluation of patient's condition and review of old charts   I assumed direction of critical care for this patient from another provider in my specialty: no     (including critical care time)  Medications Ordered  in ED Medications  lactated ringers bolus 1,000 mL (has no administration in time range)  acetaminophen (TYLENOL) tablet 1,000 mg (1,000 mg Oral Given 04/19/18 1534)  ceFEPIme (MAXIPIME) 2 g in sodium chloride 0.9 % 100 mL IVPB (0 g Intravenous Stopped 04/19/18 1618)  vancomycin (VANCOCIN) 2,000 mg in sodium chloride 0.9 % 500 mL IVPB (2,000 mg Intravenous New Bag/Given 04/19/18 1618)     Initial Impression / Assessment and Plan / ED Course  I have reviewed the triage vital signs and the nursing notes.  Pertinent labs & imaging results that were available during my care of the patient were reviewed by me and considered in my medical decision making (see chart for details).     Mirza Fessel is a 62 year old male with history of kidney stones, renal cancer who presents to the ED with fever, flank pain.  Patient with fever and tachycardia upon arrival.  Code sepsis initiated.  Patient with recent left ureteral stent placed by urology for kidney stone.  Patient has worsening dysuria.  No cough, no sputum production.  Patient is tender in the left flank area on exam.  Overall patient is well-appearing but in some discomfort.  Blood cultures, urine culture, chest x-ray obtained.  Patient empirically started on IV cefepime and IV vancomycin.  Patient given IV lactated Ringer's and Tylenol.  Patient with lab work suggestive of sepsis from urinary tract infection.  Patient with elevated white count.  Otherwise creatinine at baseline.  No significant anemia, electrolyte abnormality.  No signs of pneumonia on chest x-ray.  Dr. Karsten Ro was consulted with urology and state no surgical process at this time. Will leave a plan of care.  Patient to be admitted to medicine service for further IV antibiotics.  Patient remained hemodynamically stable throughout my care.  No signs of shock.  Final Clinical Impressions(s) / ED Diagnoses   Final diagnoses:  Sepsis, due to unspecified organism [facility])  Acute cystitis  without hematuria    ED Discharge Orders    None       Lennice Sites, DO 04/19/18 [place], [place], DO 05/01/18 1126

## 2018-04-19 NOTE — ED Notes (Signed)
While attempting to start IV, not enough blood collected for blood culture samples. IV antibiotics initiated after first blood culture collection.

## 2018-04-19 NOTE — ED Triage Notes (Signed)
He states that today he doesn't feel well and has a fever. He also tells [place] that he has recently had a left ureteral stent placed and that he is not currently on antibiotic therapy. He states the stent was placed d/t kidney stones. He also tells me that he has right renal cancer and that his physician plans to perform right nephrectomy. He also states he has a "spot" on his pancrease and is to undergo ERCP next Wed. He is ambulatory and in no distress.

## 2018-04-19 NOTE — Progress Notes (Signed)
A consult was received from an ED physician for cefepime per pharmacy dosing.  The patient's profile has been reviewed for ht/wt/allergies/indication/available labs.    Patient has a documented PCN allergy - reports to RN that he developed rash on his back. Discussed with MD. Asked RN to monitor patient for any signs of an allergic reaction.  A one time order has been placed for cefepime 2 g IV once.  Further antibiotics/pharmacy consults should be ordered by admitting physician if indicated.                       Thank you, Lenis Noon, PharmD 04/19/2018  3:16 PM

## 2018-04-19 NOTE — Progress Notes (Signed)
Pharmacy Antibiotic Note  George Lewis is a 62 y.o. male admitted on 04/19/2018 with sepsis.  Pharmacy has been consulted for vancomycin + cefepime dosing.  PMH recently diagnosed right renal cell carcinoma, hx pancreatic lesion awaiting EUS (planned for 8/28), hematuria s/p cystoscopy and left retrograde pyelogram with left ureteral stent placed on 04/16/18, hx CKD. Pt presenting with complaints of lower abdominal pain and dysuria and fevers. Broad spectrum antibiotics started on admission for sepsis likely 2/2 UTI, concern for bacteremia s/p recent stent placement per MD notes.  Patient has PCN allergy listed (reported a reaction of developing a rash on his back). Received a dose of cefepime in the ED this afternoon - patient tolerated dose. Spoke with RN - no signs of an allergic reaction.   Today, 04/19/18  WBC 16.4  SCr 2.5 - patient has CKD, unsure what is his baseline SCr  Tmax 102.8  Plan:  Vancomycin 2000 mg IV dose given in ED. Will order vancomycin 1500 mg IV q36h  Goal AUC 400-500  Cefepime 2 g IV q12h  Monitor clinical course and culture data  Closely monitor renal function  Check vancomycin levels once at steady state  Height: 6\' 1"  (185.4 cm) Weight: (!) 323 lb 13.7 oz (146.9 kg) IBW/kg (Calculated) : 79.9  Temp (24hrs), Avg:101.9 F (38.8 C), Min:100.7 F (38.2 C), Max:102.8 F (39.3 C)  Recent Labs  Lab 04/16/18 1200 04/19/18 1535 04/19/18 1544  WBC  --   --  16.4*  CREATININE 2.34*  --  2.52*  LATICACIDVEN  --  1.62  --     Estimated Creatinine Clearance: 47.6 mL/min (A) (by C-G formula based on SCr of 2.52 mg/dL (H)).    Allergies  Allergen Reactions  . Penicillins Rash and Other (See Comments)    Has patient had a PCN reaction causing immediate rash, facial/tongue/throat swelling, SOB or lightheadedness with hypotension: yes Has patient had a PCN reaction causing severe rash involving mucus membranes or skin necrosis: no Has patient had a  PCN reaction that required hospitalization: no Has patient had a PCN reaction occurring within the last 10 years: no If all of the above answers are "NO", then may proceed with Cephalosporin use.     Antimicrobials this admission: cefepime 8/22 >>  vancomycin 8/22 >>   Dose adjustments this admission:  Microbiology results: 8/22 BCx: Sent 8/22 UCx: Sent  8/22 MRSA PCR: Negative  Thank you for allowing pharmacy to be a part of this patient's care.  Lenis Noon, PharmD, BCPS Clinical Pharmacist 04/19/2018 8:45 PM

## 2018-04-19 NOTE — H&P (Signed)
History and Physical    George Lewis GMW:[id number] DOB: [birth year] DOA: 04/19/2018  PCP: Haydee Monica, [facility]  Patient coming from: Home  I have personally briefly reviewed patient's old medical records in [place]  Chief Complaint: Fever/lower abdominal pain  HPI: George Lewis is a 62 y.o. male with medical history significant of recently diagnosed right renal cell carcinoma, history of pancreatic lesion awaiting EUS scheduled for 04/25/2018, history of gross hematuria status post cystoscopy and left retrograde pyelogram left ureteroscopy with left ureteral stent placed 04/16/2018 per urology, chronic kidney disease per patient, hypertension, gastroesophageal reflux disease, obstructive sleep apnea on CPAP machine presenting to the ED with a 2-day history of lower abdominal pain, dysuria.  Patient stated post urethral stent placement 1 day prior to admission he went to work however due to lower abdominal pain and some bilateral flank pain was only able to work half a day and subsequently went home.  On the morning of admission patient was only able to go to work from 8 AM to 10 PM and came back home secondary to worsening lower abdominal pain and dysuria.  Patient noted at home to have a fever of 103, chills, weakness, lightheadedness, constipation and subsequently presented to the ED.  Patient denies any chest pain, no nausea, no shortness of breath, no diarrhea, no melena, no hematemesis, no hematochezia, no diplopia, no dysarthria, no asymmetric weakness, no further hematuria, no other focal neurological deficits.  ED Course: Patient seen in the ED noted to have a temperature as high as 102.8, tachycardic with a heart rate of 135, blood pressure of 157/68.  Comprehensive metabolic profile obtained with a glucose of 157, BUN of 33, creatinine of 2.52 otherwise within normal limits.  CBC with differential had a white count of 16.4, hemoglobin of 11.6 otherwise was within normal  limits.  Urinalysis was pending.  Blood cultures are drawn and are pending.  Chest x-ray with enlargement of cardiac silhouette with no acute abnormalities.  CT abdomen and pelvis obtained with no significant change from 04/12/2018.  Right-sided retroperitoneal hemorrhage adjacent to large right renal mass noted with some associated soft tissue gas all compatible with sequelae of recent percutaneous needle biopsy.  Retroperitoneal hemorrhage with no significant change in size, no new peri-nephric fluid or inflammation to suggest pyelonephritis.  Left kidney unremarkable without CT evidence of pyelonephritis.  Left-sided nephroureteral stent well positioned with no evidence of procedural complication.  Colonic diverticulosis without diverticulitis.  Patient given a dose of IV cefepime.  [facility] were called to admit the patient for further evaluation and management.  Review of Systems: As per HPI otherwise 10 point review of systems negative.   Past Medical History:  Diagnosis Date  . Arthritis    knees  . Cancer [facility])    right kidney new dx tuesday 04-10-18  . GERD (gastroesophageal reflux disease)    hx of   . History of kidney stones   . Hypertension    off bp med lisinopril since june or july  . OSA on CPAP   . PE (pulmonary thromboembolism) ([place]) 03/2008  . S/P ureteral stent placement: 04/16/2018 04/19/2018    Past Surgical History:  Procedure Laterality Date  . CYSTOSCOPY/RETROGRADE/URETEROSCOPY/STONE EXTRACTION WITH BASKET     x2  . CYSTOSCOPY/URETEROSCOPY/HOLMIUM LASER/STENT PLACEMENT Left 04/16/2018   Procedure: CYSTOSCOPY/LEFT URETEROSCOPY/LEFT RETROGRADE/STENT PLACEMENT;  Surgeon: Lucas Mallow, MD;  Location: [facility] ORS;  Service: Urology;  Laterality: Left;  . EXTRACORPOREAL SHOCK WAVE LITHOTRIPSY  x 3  . left index finger attachment  18-19 yrs ago   3-4 surgeries  . TOE SURGERY  12-14 yrs ago   in beween 2nd and 3rd toes cyst removed     reports that he has  never smoked. He has never used smokeless tobacco. He reports that he does not drink alcohol or use drugs.  Allergies  Allergen Reactions  . Penicillins Rash and Other (See Comments)    Has patient had a PCN reaction causing immediate rash, facial/tongue/throat swelling, SOB or lightheadedness with hypotension: yes Has patient had a PCN reaction causing severe rash involving mucus membranes or skin necrosis: no Has patient had a PCN reaction that required hospitalization: no Has patient had a PCN reaction occurring within the last 10 years: no If all of the above answers are "NO", then may proceed with Cephalosporin use.     Family History  Problem Relation Age of Onset  . Alzheimer's disease Mother   . Alzheimer's disease Father   . Heart disease Father    Mother deceased age 87 from Alzheimer's disease.  Father deceased age 17 from Alzheimer's disease.  Prior to Admission medications   Medication Sig Start Date End Date Taking? Authorizing Provider  acetaminophen (TYLENOL) 500 MG tablet Take 500-1,000 mg by mouth every 8 (eight) hours as needed for mild pain.    Yes [provider]  allopurinol (ZYLOPRIM) 300 MG tablet Take 300 mg by mouth every evening.  03/08/18  Yes [provider]  diphenhydramine-acetaminophen (TYLENOL PM) 25-500 MG TABS tablet Take 3 tablets by mouth at bedtime as needed (pain).   Yes [provider]  diphenhydramine-acetaminophen (TYLENOL PM) 25-500 MG TABS tablet Take 1 tablet by mouth at bedtime as needed (sleep).   Yes [provider]  fluticasone (FLONASE) 50 MCG/ACT nasal spray Place 2 sprays into both nostrils daily as needed for allergies or rhinitis.   Yes [provider]  HYDROcodone-acetaminophen (NORCO/VICODIN) 5-325 MG tablet Take 1 tablet by mouth every 4 (four) hours as needed for moderate pain. 04/16/18 04/16/19 Yes Marton Redwood III, MD  loratadine (CLARITIN) 10 MG tablet Take 10 mg by mouth daily.   Yes  [provider]  omeprazole (PRILOSEC OTC) 20 MG tablet Take 40 mg by mouth daily.    Yes [provider]  Potassium Citrate 15 MEQ (1620 MG) TBCR Take 1 tablet by mouth every evening.  03/22/18  Yes [provider]  tamsulosin (FLOMAX) 0.4 MG CAPS capsule Take 0.4 mg by mouth every evening.  03/08/18  Yes [provider]    Physical Exam: Vitals:   04/19/18 1758 04/19/18 1800 04/19/18 1852 04/19/18 1905  BP:  140/72  (!) 157/68  Pulse:  (!) 113    Resp:      Temp: (!) 100.7 F (38.2 C)   (!) 101.2 F (38.4 C)  TempSrc: Oral   Oral  SpO2:  98%    Weight:   (!) 146.9 kg   Height:   6\' 1"  (1.854 m)     Constitutional: NAD, calm, comfortable Vitals:   04/19/18 1758 04/19/18 1800 04/19/18 1852 04/19/18 1905  BP:  140/72  (!) 157/68  Pulse:  (!) 113    Resp:      Temp: (!) 100.7 F (38.2 C)   (!) 101.2 F (38.4 C)  TempSrc: Oral   Oral  SpO2:  98%    Weight:   (!) 146.9 kg   Height:   6\' 1"  (1.854  m)    Eyes: PERRLA, lids and conjunctivae normal ENMT: Mucous membranes are dry. Posterior pharynx clear of any exudate or lesions.Normal dentition.  Neck: normal, supple, no masses, no thyromegaly Respiratory: clear to auscultation bilaterally, no wheezing, no crackles. Normal respiratory effort. No accessory muscle use.  Cardiovascular: Tachycardic, no murmurs / rubs / gallops. No extremity edema. 2+ pedal pulses. No carotid bruits.  Abdomen: Soft, nondistended, lower abdominal tenderness to palpation.  Bilateral CVA tenderness right greater than left. No masses palpated. No hepatosplenomegaly. Bowel sounds positive.  Musculoskeletal: no clubbing / cyanosis. No joint deformity upper and lower extremities. Good ROM, no contractures. Normal muscle tone.  Skin: no rashes, lesions, ulcers. No induration Neurologic: CN 2-12 grossly intact. Sensation intact, DTR normal. Strength 5/5 in all 4.  Psychiatric: Normal judgment and insight. Alert and oriented x  3. Normal mood.    Labs on Admission: I have personally reviewed following labs and imaging studies  CBC: Recent Labs  Lab 04/19/18 1544  WBC 16.4*  NEUTROABS 12.6*  HGB 11.6*  HCT 35.7*  MCV 86.2  PLT 413   Basic Metabolic Panel: Recent Labs  Lab 04/16/18 1200 04/19/18 1544  NA 141 136  K 4.6 4.6  CL 108 103  CO2 20* 21*  GLUCOSE 113* 157*  BUN 22* 33*  CREATININE 2.34* 2.52*  CALCIUM 8.9 8.9   GFR: Estimated Creatinine Clearance: 47.6 mL/min (A) (by C-G formula based on SCr of 2.52 mg/dL (H)). Liver Function Tests: Recent Labs  Lab 04/19/18 1544  AST 17  ALT 13  ALKPHOS 76  BILITOT 0.6  PROT 7.5  ALBUMIN 3.5   No results for input(s): LIPASE, AMYLASE in the last 168 hours. No results for input(s): AMMONIA in the last 168 hours. Coagulation Profile: No results for input(s): INR, PROTIME in the last 168 hours. Cardiac Enzymes: No results for input(s): CKTOTAL, CKMB, CKMBINDEX, TROPONINI in the last 168 hours. BNP (last 3 results) No results for input(s): PROBNP in the last 8760 hours. HbA1C: No results for input(s): HGBA1C in the last 72 hours. CBG: No results for input(s): GLUCAP in the last 168 hours. Lipid Profile: No results for input(s): CHOL, HDL, LDLCALC, TRIG, CHOLHDL, LDLDIRECT in the last 72 hours. Thyroid Function Tests: No results for input(s): TSH, T4TOTAL, FREET4, T3FREE, THYROIDAB in the last 72 hours. Anemia Panel: No results for input(s): VITAMINB12, FOLATE, FERRITIN, TIBC, IRON, RETICCTPCT in the last 72 hours. Urine analysis:    Component Value Date/Time   COLORURINE YELLOW 04/19/2018 1801   APPEARANCEUR CLEAR 04/19/2018 1801   LABSPEC 1.011 04/19/2018 1801   PHURINE 6.0 04/19/2018 1801   GLUCOSEU 50 (A) 04/19/2018 1801   HGBUR MODERATE (A) 04/19/2018 1801   BILIRUBINUR NEGATIVE 04/19/2018 1801   KETONESUR NEGATIVE 04/19/2018 1801   PROTEINUR 100 (A) 04/19/2018 1801   NITRITE NEGATIVE 04/19/2018 1801   LEUKOCYTESUR LARGE (A)  04/19/2018 1801    Radiological Exams on Admission: Ct Abdomen Pelvis Wo Contrast  Result Date: 04/19/2018 CLINICAL DATA:  Fever. LEFT-sided pain. LEFT-sided stent placed on Monday. Recent diagnosis of RIGHT renal cancer and possible metastasis to pancreas. Pyelonephritis, complicated. EXAM: CT ABDOMEN AND PELVIS WITHOUT CONTRAST TECHNIQUE: Multidetector CT imaging of the abdomen and pelvis was performed following the standard protocol without IV contrast. COMPARISON:  CT abdomen dated 04/12/2018. FINDINGS: Lower chest: No acute abnormality. Hepatobiliary: No focal liver abnormality is seen. No gallstones, gallbladder wall thickening, or biliary dilatation. Pancreas: Infiltrated with fat but otherwise unremarkable. Spleen: Normal in size without  focal abnormality. Adrenals/Urinary Tract: LEFT-sided nephroureteral stent appears appropriately positioned with proximal tip at the level of the LEFT renal pelvis and distal tip within the bladder. LEFT kidney otherwise unremarkable. The large RIGHT renal mass is unchanged in the short-term interval, measured on today's exam it 10 x 7 cm. Again noted is adjacent retroperitoneal hemorrhage, not significantly changed compared to the previous study. The associated soft tissue gas within the collection is compatible with the given history of recent percutaneous needle biopsy. No new perinephric fluid. No RIGHT-sided hydronephrosis or hydroureter. Bladder is unremarkable. Stomach/Bowel: Bowel is normal in caliber. No bowel wall thickening or evidence of bowel wall inflammation seen. Diverticulosis again noted within the upper sigmoid colon and descending colon but no focal inflammatory change to suggest acute diverticulitis. Stomach is unremarkable. Vascular/Lymphatic: No abdominal aortic aneurysm. No pathologically enlarged lymph nodes identified. Reproductive: Prostate is unremarkable. Other: No new intra-abdominal or intrapelvic fluid collection. No evidence of  intraperitoneal abscess. Musculoskeletal: No acute or suspicious osseous finding. IMPRESSION: 1. No significant change compared to the CT abdomen and pelvis of 04/12/2018. Again noted is RIGHT-sided retroperitoneal hemorrhage adjacent to the large RIGHT renal mass, with some associated soft tissue gas, all of which is again compatible with sequela of recent percutaneous needle biopsy. The retroperitoneal hemorrhage is not significantly changed in size or extent. No new perinephric fluid or inflammation to suggest concomitant pyelonephritis. 2. LEFT kidney is unremarkable, also without CT evidence of pyelonephritis. The LEFT-sided nephroureteral stent appears well positioned. No evidence of procedural complicating feature. 3. Colonic diverticulosis without evidence of acute diverticulitis. Electronically Signed   By: Franki Cabot M.D.   On: 04/19/2018 16:48   Dg Chest Port 1 View  Result Date: 04/19/2018 CLINICAL DATA:  Fever today, recent LEFT ureteral stent placement EXAM: PORTABLE CHEST 1 VIEW COMPARISON:  Portable exam 1524 hours compared to 03/28/2018 FINDINGS: Enlargement of cardiac silhouette. Mediastinal contours and pulmonary vascularity normal. Low lung volumes. No gross infiltrate, pleural effusion or pneumothorax. Osseous structures unremarkable. IMPRESSION: Enlargement of cardiac silhouette. No acute abnormalities. Electronically Signed   By: Lavonia Dana M.D.   On: 04/19/2018 15:49    EKG: Independently reviewed.  Sinus tachycardia heart rate 120 with poor R wave progression.  Assessment/Plan Principal Problem:   Sepsis ([place]) Active Problems:   CKD (chronic kidney disease)   Dehydration   UTI (urinary tract infection): Probable   Leukocytosis   Anemia   Renal cell carcinoma, right (HCC)   S/P ureteral stent placement: 04/16/2018   GERD (gastroesophageal reflux disease)   OSA on CPAP   HTN (hypertension)   #1 sepsis likely secondary to UTI, concern for bacteremia secondary to  recent urethral instrumentation. Patient presented with lower abdominal pain, dysuria, with a temperature as high as 102.8.  Patient noted to be tachycardic with heart rate as high as 135.  CBC done with a white count of 16.4 with a left shift.  Creatinine at 2.52.  Urinalysis done with large leukocytes, negative nitrite, greater than 50 RBCs, greater than 50 WBCs, budding yeast present.  Blood cultures have been drawn and are pending.  Place patient on IV fluids with normal saline at 100 cc/h due to history of probable chronic kidney disease.  Check a procalcitonin level.  Placed empirically on IV cefepime and IV vancomycin and monitor.  If continued fevers despite broad-spectrum antibiotics may need to place on a antifungal agent.  Urology has been consulted.  2.  History of kidney stones status post recent urethral  stent placement 04/16/2018 Patient now presenting with sepsis.  Concern for UTI as urinalysis consistent with a UTI.  Patient with recent instrumentation concern for possible bacteremia.  Patient has been pancultured results are pending.  Place empirically on IV cefepime and IV vancomycin.  Urology consulted.  3.  Gastroesophageal reflux disease PPI.  4.  Dehydration IV fluids.  Monitor for volume overload with history of chronic kidney disease.  5.  Chronic kidney disease Stable.  Continue Flomax.  Monitor with hydration.  6.  Obstructive sleep apnea CPAP nightly.  7.  Hypertension Stable.  Monitor for now.  Place on hydralazine as needed.  8.  Pancreatic lesion Patient is to have an EUS for further evaluation per gastroenterology on 04/25/2018.  Will inform gastroenterology of patient's admission.  9.  Right renal cell carcinoma Recently diagnosed.  Per urology.  Urology consulted.   DVT prophylaxis: SCDs Code Status: Full Family Communication: Updated patient and wife at bedside. Disposition Plan: Likely home once clinically improved, resolution of sepsis, improving  clinically. Consults called: Urology Admission status: Admit to inpatient stepdown unit.   Irine Seal MD [facility] Pager [phone]  If 7PM-7AM, please contact night-coverage [email or web address] Password [facility]  04/19/2018, 7:31 PM

## 2018-04-19 NOTE — Consult Note (Signed)
From Dr. Purvis Sheffield 03/28/18 office note:  Patient underwent a CT scan of the abdomen and pelvis without contrast on 03/19/2018. This revealed a complex right renal mass. He subsequently underwent an MRI without contrast due to his renal function. This revealed a 10.9 x 6.7 x 8.8 cm right renal mass highly suspicious for renal cell carcinoma however it was incompletely characterized due to the lack of IV contrast. The patient has been having right-sided flank pain and gross hematuria. He however also has a 3 mm left distal ureteral calculus. He has not seen a stone passed. However, he has been experiencing gross hematuria and not straining his urine. He has no pain on the left side. GFR on 02/07/2018 was 29 and creatinine of 2.37.  Plan: I discussed the differential diagnoses with the patient including malignancy which is highly concerning as well as the possibility of a benign lesion. His GFR is 30 and his left kidney is atrophic on CT scan. I discussed with him that nephrectomy has a good chance of placing him on dialysis. I discussed the implications of this. Also discussed that without contrast, this mass is incompletely characterized. I discussed the option of renal mass biopsy as well as proceeding with laparoscopic nephrectomy. To ensure diagnosis of renal cell carcinoma prior to nephrectomy which would have a good chance of putting him on dialysis, he will proceed with interventional radiology guided percutaneous biopsy of the mass. He also has a distal left ureteral calculus. This will need to be resolved prior to nephrectomy. This can potentially be scanned to ensure passage during his biopsy. I discussed biopsy of the mass with Ronny Bacon and we will plan to proceed with this.   04/16/18: Patient underwent cystoscopy with left retrograde pyelogram and left ureteroscopy with no stone noted within the ureter.  A a left ureteral stent was left in place.  Plan: Remove stent in 1 week.  Plan for further  evaluation of the pancreatic mass with biopsy prior to proceeding with right nephrectomy.  04/19/18: He is admitted now with possible sepsis.  While it could be from a urinary source it doesn't appear that way with urine that is nitrite negative and only rare bacteria.  Red and white cells are present as to be expected after ureteroscopy with stent in place.  There is no obstruction on the left side as there is a stent.  There is no obstruction on the right.  It does not appear to be any need for urologic intervention anyway at this time.

## 2018-04-20 ENCOUNTER — Other Ambulatory Visit: Payer: Self-pay

## 2018-04-20 DIAGNOSIS — Z8619 Personal history of other infectious and parasitic diseases: Secondary | ICD-10-CM

## 2018-04-20 DIAGNOSIS — N189 Chronic kidney disease, unspecified: Secondary | ICD-10-CM

## 2018-04-20 HISTORY — DX: Personal history of other infectious and parasitic diseases: Z86.19

## 2018-04-20 LAB — CBC WITH DIFFERENTIAL/PLATELET
Basophils Absolute: 0 10*3/uL (ref 0.0–0.1)
Basophils Relative: 0 %
EOS PCT: 2 %
Eosinophils Absolute: 0.4 10*3/uL (ref 0.0–0.7)
HEMATOCRIT: 32.6 % — AB (ref 39.0–52.0)
HEMOGLOBIN: 10.4 g/dL — AB (ref 13.0–17.0)
LYMPHS ABS: 1.3 10*3/uL (ref 0.7–4.0)
LYMPHS PCT: 7 %
MCH: 27.8 pg (ref 26.0–34.0)
MCHC: 31.9 g/dL (ref 30.0–36.0)
MCV: 87.2 fL (ref 78.0–100.0)
Monocytes Absolute: 2.4 10*3/uL — ABNORMAL HIGH (ref 0.1–1.0)
Monocytes Relative: 13 %
NEUTROS ABS: 13.6 10*3/uL — AB (ref 1.7–7.7)
NEUTROS PCT: 78 %
Platelets: 264 10*3/uL (ref 150–400)
RBC: 3.74 MIL/uL — AB (ref 4.22–5.81)
RDW: 17 % — ABNORMAL HIGH (ref 11.5–15.5)
WBC: 17.7 10*3/uL — ABNORMAL HIGH (ref 4.0–10.5)

## 2018-04-20 LAB — COMPREHENSIVE METABOLIC PANEL
ALT: 11 U/L (ref 0–44)
ANION GAP: 7 (ref 5–15)
AST: 13 U/L — AB (ref 15–41)
Albumin: 3 g/dL — ABNORMAL LOW (ref 3.5–5.0)
Alkaline Phosphatase: 64 U/L (ref 38–126)
BUN: 32 mg/dL — AB (ref 6–20)
CHLORIDE: 105 mmol/L (ref 98–111)
CO2: 24 mmol/L (ref 22–32)
Calcium: 8.5 mg/dL — ABNORMAL LOW (ref 8.9–10.3)
Creatinine, Ser: 2.53 mg/dL — ABNORMAL HIGH (ref 0.61–1.24)
GFR calc Af Amer: 30 mL/min — ABNORMAL LOW (ref >60)
GFR calc non Af Amer: 26 mL/min — ABNORMAL LOW (ref >60)
Glucose, Bld: 147 mg/dL — ABNORMAL HIGH (ref 70–99)
POTASSIUM: 4.9 mmol/L (ref 3.5–5.1)
Sodium: 136 mmol/L (ref 135–145)
Total Bilirubin: 0.8 mg/dL (ref 0.3–1.2)
Total Protein: 6.7 g/dL (ref 6.5–8.1)

## 2018-04-20 LAB — GLUCOSE, CAPILLARY
GLUCOSE-CAPILLARY: 130 mg/dL — AB (ref 70–99)
Glucose-Capillary: 142 mg/dL — ABNORMAL HIGH (ref 70–99)
Glucose-Capillary: 167 mg/dL — ABNORMAL HIGH (ref 70–99)

## 2018-04-20 LAB — IRON AND TIBC
Iron: 19 ug/dL — ABNORMAL LOW (ref 45–182)
Saturation Ratios: 7 % — ABNORMAL LOW (ref 17.9–39.5)
TIBC: 260 ug/dL (ref 250–450)
UIBC: 241 ug/dL

## 2018-04-20 LAB — FOLATE: Folate: 7.5 ng/mL (ref >5.9)

## 2018-04-20 LAB — PROCALCITONIN: PROCALCITONIN: 1.58 ng/mL

## 2018-04-20 LAB — FERRITIN: FERRITIN: 274 ng/mL (ref 24–336)

## 2018-04-20 LAB — VITAMIN B12: Vitamin B-12: 215 pg/mL (ref 180–914)

## 2018-04-20 LAB — HIV ANTIBODY (ROUTINE TESTING W REFLEX): HIV SCREEN 4TH GENERATION: NONREACTIVE

## 2018-04-20 MED ORDER — BOOST / RESOURCE BREEZE PO LIQD CUSTOM
1.0000 | ORAL | Status: DC
  Administered 2018-04-20: 1 via ORAL

## 2018-04-20 MED ORDER — SODIUM CHLORIDE 0.9 % IV SOLN
INTRAVENOUS | Status: DC | PRN
  Administered 2018-04-20 – 2018-04-22 (×3): 250 mL via INTRAVENOUS

## 2018-04-20 MED ORDER — PRO-STAT SUGAR FREE PO LIQD
30.0000 mL | Freq: Two times a day (BID) | ORAL | Status: DC
  Administered 2018-04-20 – 2018-04-21 (×3): 30 mL via ORAL
  Filled 2018-04-20 (×6): qty 30

## 2018-04-20 NOTE — Evaluation (Signed)
Occupational Therapy Evaluation and Discharge Patient Details Name: George Lewis MRN: [id number] DOB: August 25, 1956 Today's Date: 04/20/2018    History of Present Illness 62 y.o. male with medical history significant of recently diagnosed right renal cell carcinoma, history of pancreatic lesion awaiting EUS scheduled for 04/25/2018, history of gross hematuria status post cystoscopy and left retrograde pyelogram left ureteroscopy with left ureteral stent placed 04/16/2018 per urology, chronic kidney disease per patient, hypertension, gastroesophageal reflux disease, obstructive sleep apnea on CPAP machine presenting to the ED with a 2-day history of lower abdominal pain, dysuria   Clinical Impression   This 62 yo male admitted with above presents to acute OT at a Mod I/independent level with wife to A prn. No further OT needs, we will sign off.    Follow Up Recommendations  No OT follow up;Supervision - Intermittent    Equipment Recommendations  None recommended by OT       Precautions / Restrictions Precautions Precaution Comments: HR already high (110's at start) up to 132 at highest with ambulation Restrictions Weight Bearing Restrictions: No      Mobility Bed Mobility               General bed mobility comments: pt up in recliner upon arrival  Transfers Overall transfer level: Independent Equipment used: None                  Balance Overall balance assessment: No apparent balance deficits (not formally assessed)                                         ADL either performed or assessed with clinical judgement   ADL Overall ADL's : Modified independent                                       General ADL Comments: Increased time, wife can A prn. He did struggle with getting 2nd leg into underwear--wife did A (feel pt could do this with increased time). Pt able to get up and down from recliner without issues so toilet should not be  an issue and with his mobility being at an independent level do not foresee any issues with tub transfers as well.      Vision Patient Visual Report: No change from baseline              Pertinent Vitals/Pain Pain Assessment: 0-10 Pain Score: 1  Pain Location: right lower abdominal quadrant Pain Descriptors / Indicators: Sore Pain Intervention(s): Limited activity within patient's tolerance;Monitored during session     Hand Dominance Right   Extremity/Trunk Assessment Upper Extremity Assessment Upper Extremity Assessment: Overall WFL for tasks assessed           Communication Communication Communication: No difficulties   Cognition Arousal/Alertness: Awake/alert Behavior During Therapy: WFL for tasks assessed/performed Overall Cognitive Status: Within Functional Limits for tasks assessed                                                Home Living Family/patient expects to be discharged to:: Private residence Living Arrangements: Spouse/significant other Available Help at Discharge: Family Type of Home: House Home Access: Stairs to  enter Entrance Stairs-Number of Steps: 3 Entrance Stairs-Rails: Right Home Layout: One level     Bathroom Shower/Tub: Teacher, early years/pre: Standard     Home Equipment: None          Prior Functioning/Environment Level of Independence: Independent        Comments: 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 Problem List: Pain         OT Goals(Current goals can be found in the care plan section) Acute Rehab OT Goals Patient Stated Goal: home soon and would really like to go back to work  OT Frequency:             Co-evaluation PT/OT/SLP Co-Evaluation/Treatment: Yes Reason for Co-Treatment: For patient/therapist safety   OT goals addressed during session: ADL's and self-care;Strengthening/ROM      AM-PAC PT "6 Clicks" Daily Activity     Outcome Measure Help from  another person eating meals?: None Help from another person taking care of personal grooming?: None Help from another person toileting, which includes using toliet, bedpan, or urinal?: None Help from another person bathing (including washing, rinsing, drying)?: None Help from another person to put on and taking off regular upper body clothing?: None Help from another person to put on and taking off regular lower body clothing?: A Little 6 Click Score: 23   End of Session Equipment Utilized During Treatment: Gait belt  Activity Tolerance: Patient tolerated treatment well Patient left: in chair;with call bell/phone within reach;with family/visitor present  OT Visit Diagnosis: Pain Pain - Right/Left: Right Pain - part of body: (lower abdominal quadrant)                Time: 7703-4035 OT Time Calculation (min): 14 min Charges:  OT General Charges $OT Visit: 1 Visit OT Evaluation $OT Eval Moderate Complexity: [address]  [place], [place] [phone] 04/20/2018

## 2018-04-20 NOTE — Progress Notes (Signed)
Initial Nutrition Assessment  DOCUMENTATION CODES:   Morbid obesity  INTERVENTION:  - Will order Boost Breeze po TID, each supplement provides 250 kcal and 9 grams of protein - Will order 30 mL Prostat BID, each supplement provides 100 kcal and 15 grams of protein. - Will talk with MD about liberalizing diet from Renal to Low Potassium. - Handouts outlining potassium content of foods provided.  - Continue to encourage PO intakes.    NUTRITION DIAGNOSIS:   Increased nutrient needs related to catabolic illness, cancer and cancer related treatments as evidenced by estimated needs.  GOAL:   Patient will meet greater than or equal to 90% of their needs  MONITOR:   PO intake, Supplement acceptance, Weight trends, Labs, I & O's  REASON FOR ASSESSMENT:   Malnutrition Screening Tool  ASSESSMENT:   62 y.o. male with medical history significant of recently diagnosed right renal cell carcinoma, pancreatic lesion awaiting EUS scheduled for 04/25/2018, gross hematuria s/p cystoscopy and L retrograde pyelogram L ureteroscopy with L ureteral stent placed 04/16/18, CKD (per patient), HTN, GERD, OSA on CPAP. He presented to the ED with a 2-day history of lower abdominal pain, dysuria.  Patient stated post urethral stent placement 1 day prior to admission he went to work however due to lower abdominal pain and some bilateral flank pain was only able to work half a day and subsequently went home.  On the morning of admission patient was only able to go to work for 2 hours secondary to worsening lower abdominal pain and dysuria.  Patient noted at home to have a fever of 103, chills, weakness, lightheadedness, constipation and subsequently presented to the ED.  No intakes documented since admission. Patient states he had a few bites each of scrambled egg, grits, toast, and peaches for breakfast. He reports slight abdominal pain/pressure this AM and denies this being worsened by intakes. He denies nausea  now or PTA. He states that PTA he did have issues with hyperkalemia and that outpatient MD encouraged him to avoid potatoes, tomatoes, bananas, pickles. Patient states muscular pain associated with hyperkalemia. He states that when he is not feeling well and when abdominal pain is as severe as it was PTA it is difficult for him to eat, has lack of desire to eat. Patient states that appetite is returning now that abdominal pain is better controlled.   Patient feels that weight may have changed PTA but is unsure how much he may have lost. Limited weight hx in the chart. Per review, patient currently weighs 329 lb and weighed 314 lb on 04/09/18; anticipate weight change given current fluid restriction. He weighed 311 lb on 11/29/06 at [facility].    Medications reviewed; 1 tablet Senokot BID.  Labs reviewed; BUN: 32 mg/dL, creatinine: 2.53 mg/dL, Ca: 8.5 mg/dL, GFR: 26 mL/min. IVF: NS @ 100 mL/hr.      NUTRITION - FOCUSED PHYSICAL EXAM:  Completed; no muscle and no fat wasting.   Diet Order:   Diet Order            Diet renal with fluid restriction Fluid restriction: 1500 mL Fluid; Room service appropriate? Yes; Fluid consistency: Thin  Diet effective now              EDUCATION NEEDS:   Education needs have been addressed  Skin:  Skin Assessment: Reviewed RN Assessment  Last BM:  8/22  Height:   Ht Readings from Last 1 Encounters:  04/19/18 6\' 1"  (1.854 m)    Weight:  Wt Readings from Last 1 Encounters:  04/20/18 (!) 149.3 kg    Ideal Body Weight:  83.64 kg  BMI:  Body mass index is 43.43 kg/m.  Estimated Nutritional Needs:   Kcal:  1683-7290 (16-17 kcal/kg)  Protein:  125-135 grams  Fluid:  >/= 2.2 L/day     Jarome Matin, MS, RD, LDN, [facility] Inpatient Clinical Dietitian Pager # [phone] After hours/weekend pager # [phone]

## 2018-04-20 NOTE — Care Management Note (Signed)
Case Management Note  Patient Details  Name: George Lewis MRN: [id number] Date of Birth: 25-Sep-1955  Subjective/Objective:                  Principal Problem:   Sepsis ([place]) Wbc-17.7./bun-32, creat.-2.53 Action/Plan: Iv ns at 100cc/hrs, iv maxipime 2gm q 12 hours,iv vancomycin/ Following for progression and cm needs.  Expected Discharge Date:  (unknown)               Expected Discharge Plan:     In-House Referral:     Discharge planning Services     Post Acute Care Choice:    Choice offered to:     DME Arranged:    DME Agency:     HH Arranged:    HH Agency:     Status of Service:     If discussed at [employer], dates discussed:    Additional Comments:  Leeroy Cha, RN 04/20/2018, 9:17 AM

## 2018-04-20 NOTE — Consult Note (Signed)
Urology Consult  CC: Referring physician: Irine Seal, MD Reason for referral: Lower abdominal pain and fever  Impression/Assessment: Lower abdominal pain and fever: He had an elevated white count of 17.7 on admission but a normal procalcitonin.  His urinalysis had greater than 50 white cells and greater than 50 red cells but did not appear infected with only rare bacteria.  He may have a UTI although it did not appear that way from his urinalysis.  He may have had mild pyelonephritis as well since he has a stent in place that could allow infected urine access to the kidney.  He did not have significant CVAT on examination of his left flank.  He still has a stent in which certainly could cause some suprapubic/lower abdominal pain to some degree.  The plan is to remove his stent next week.  He is currently on antibiotics.  Right renal mass:  This appears to be a renal cell carcinoma by imaging criteria.  A right nephrectomy is being considered however evaluation of a lesion in his pancreas needs to be undertaken to rule out metastatic disease first.   Plan:  1.  Broad-spectrum antibiotics.   2.  Await blood and urine culture results. 3.  Maintain left ureteral stent for now. 4.  Tentatively plan to proceed with endoscopic biopsy of his pancreatic lesion next week.   History of Present Illness: George Lewis is a 62 year old male patient of Dr. Gloriann Loan who is seen in hospital consultation today for further evaluation of lower abdominal pain and fever.  He underwent ureteroscopy and stent placement on 04/16/2018 and was doing well until yesterday when he said he developed fever to 103.  He reports he was not experiencing any flank pain and ended up going to work yesterday but developed lower abdominal pain and dysuria associated with a feeling of inability to urinate.  He reports that he has not been experiencing any significant flank pain.  He said he does have some slight dull aching on the  left-hand side after having his stent placed. He has a right renal mass as well and is a mass in the pancreas.  He scheduled for endoscopic evaluation of his pancreatic lesion by GI next week.  The results of this biopsy will determine further management of his right renal mass.  Past Medical History:  Diagnosis Date  . Arthritis    knees  . Cancer [facility])    right kidney new dx tuesday 04-10-18  . GERD (gastroesophageal reflux disease)    hx of   . History of kidney stones   . Hypertension    off bp med lisinopril since june or july  . OSA on CPAP   . PE (pulmonary thromboembolism) ([place]) 03/2008  . S/P ureteral stent placement: 04/16/2018 04/19/2018   Past Surgical History:  Procedure Laterality Date  . CYSTOSCOPY/RETROGRADE/URETEROSCOPY/STONE EXTRACTION WITH BASKET     x2  . CYSTOSCOPY/URETEROSCOPY/HOLMIUM LASER/STENT PLACEMENT Left 04/16/2018   Procedure: CYSTOSCOPY/LEFT URETEROSCOPY/LEFT RETROGRADE/STENT PLACEMENT;  Surgeon: Lucas Mallow, MD;  Location: [facility] ORS;  Service: Urology;  Laterality: Left;  . EXTRACORPOREAL SHOCK WAVE LITHOTRIPSY     x 3  . left index finger attachment  18-19 yrs ago   3-4 surgeries  . TOE SURGERY  12-14 yrs ago   in beween 2nd and 3rd toes cyst removed    Medications:  Prior to Admission:  Medications Prior to Admission  Medication Sig Dispense Refill Last Dose  . acetaminophen (TYLENOL) 500 MG  tablet Take 500-1,000 mg by mouth every 8 (eight) hours as needed for mild pain.    04/19/2018 at Unknown time  . allopurinol (ZYLOPRIM) 300 MG tablet Take 300 mg by mouth every evening.   3 04/18/2018 at Unknown time  . diphenhydramine-acetaminophen (TYLENOL PM) 25-500 MG TABS tablet Take 3 tablets by mouth at bedtime as needed (pain).   Past Week at Unknown time  . diphenhydramine-acetaminophen (TYLENOL PM) 25-500 MG TABS tablet Take 1 tablet by mouth at bedtime as needed (sleep).   Past Week at Unknown time  . fluticasone (FLONASE) 50 MCG/ACT nasal spray  Place 2 sprays into both nostrils daily as needed for allergies or rhinitis.   Past Week at Unknown time  . HYDROcodone-acetaminophen (NORCO/VICODIN) 5-325 MG tablet Take 1 tablet by mouth every 4 (four) hours as needed for moderate pain. 10 tablet 0 04/19/2018 at Unknown time  . loratadine (CLARITIN) 10 MG tablet Take 10 mg by mouth daily.   04/18/2018 at Unknown time  . omeprazole (PRILOSEC OTC) 20 MG tablet Take 40 mg by mouth daily.    04/18/2018 at Unknown time  . Potassium Citrate 15 MEQ (1620 MG) TBCR Take 1 tablet by mouth every evening.   3 04/18/2018 at Unknown time  . tamsulosin (FLOMAX) 0.4 MG CAPS capsule Take 0.4 mg by mouth every evening.   3 04/18/2018 at Unknown time    Allergies:  Allergies  Allergen Reactions  . Penicillins Rash and Other (See Comments)    Has patient had a PCN reaction causing immediate rash, facial/tongue/throat swelling, SOB or lightheadedness with hypotension: yes Has patient had a PCN reaction causing severe rash involving mucus membranes or skin necrosis: no Has patient had a PCN reaction that required hospitalization: no Has patient had a PCN reaction occurring within the last 10 years: no If all of the above answers are "NO", then may proceed with Cephalosporin use.     Family History  Problem Relation Age of Onset  . Alzheimer's disease Mother   . Alzheimer's disease Father   . Heart disease Father     Social History:  reports that he has never smoked. He has never used smokeless tobacco. He reports that he does not drink alcohol or use drugs.  Review of Systems (10 point): Pertinent items are noted in HPI. A comprehensive review of systems was negative except as noted above.  Physical Exam:  Vital signs in last 24 hours: Temp:  [99.9 F (37.7 C)-102.8 F (39.3 C)] 100.7 F (38.2 C) (08/23 0800) Pulse Rate:  [102-135] 132 (08/23 1029) Resp:  [16-31] 23 (08/23 0800) BP: (134-157)/(68-89) 147/70 (08/23 0547) SpO2:  [96 %-99 %] 96 %  (08/23 0800) Weight:  [145.2 kg-149.3 kg] 149.3 kg (08/23 0500) General appearance: alert and appears stated age Head: Normocephalic, without obvious abnormality, atraumatic Eyes: conjunctivae/corneas clear. EOM's intact.  Oropharynx: moist mucous membranes Neck: supple, symmetrical, trachea midline Resp: normal respiratory effort Cardio: regular rate and rhythm Back: symmetric, no curvature. ROM normal.  He had very minimal CVAT to percussion and actually had more on the right than the left. GI: soft, non-tender; bowel sounds normal; no masses,  no organomegaly Male genitalia: penis: normal male phallus with no lesions or discharge.Testes: bilaterally descended with no masses or tenderness. no hernias Extremities: extremities normal, atraumatic, no cyanosis or edema Skin: Skin color normal. No visible rashes or lesions Neurologic: Grossly normal  Laboratory Data:  Recent Labs    04/19/18 1544 04/20/18 0339  WBC 16.4* 17.7*  HGB 11.6* 10.4*  HCT 35.7* 32.6*   BMET Recent Labs    04/19/18 1544 04/20/18 0339  NA 136 136  K 4.6 4.9  CL 103 105  CO2 21* 24  GLUCOSE 157* 147*  BUN 33* 32*  CREATININE 2.52* 2.53*  CALCIUM 8.9 8.5*   No results for input(s): LABPT, INR in the last 72 hours. No results for input(s): LABURIN in the last 72 hours. Results for orders placed or performed during the hospital encounter of 04/19/18  Blood Culture (routine x 2)     Status: None (Preliminary result)   Collection Time: 04/19/18  3:25 PM  Result Value Ref Range Status   Specimen Description BLOOD RIGHT HAND  Final   Special Requests   Final    BOTTLES DRAWN AEROBIC AND ANAEROBIC Blood Culture adequate volume Performed at [facility] [address], [place], [place] [address]    Culture PENDING  Incomplete   Report Status PENDING  Incomplete  MRSA PCR Screening     Status: None   Collection Time: 04/19/18  7:06 PM  Result Value Ref Range Status   MRSA by PCR NEGATIVE  NEGATIVE Final    Comment:        The GeneXpert MRSA Assay (FDA approved for NASAL specimens only), is one component of a comprehensive MRSA colonization surveillance program. It is not intended to diagnose MRSA infection nor to guide or monitor treatment for MRSA infections. Performed at [facility], [place] [address], [place], [place] [address]    Creatinine: Recent Labs    04/16/18 1200 04/19/18 1544 04/20/18 0339  CREATININE 2.34* 2.52* 2.53*    Imaging: Ct Abdomen Pelvis Wo Contrast  Result Date: 04/19/2018 CLINICAL DATA:  Fever. LEFT-sided pain. LEFT-sided stent placed on Monday. Recent diagnosis of RIGHT renal cancer and possible metastasis to pancreas. Pyelonephritis, complicated. EXAM: CT ABDOMEN AND PELVIS WITHOUT CONTRAST TECHNIQUE: Multidetector CT imaging of the abdomen and pelvis was performed following the standard protocol without IV contrast. COMPARISON:  CT abdomen dated 04/12/2018. FINDINGS: Lower chest: No acute abnormality. Hepatobiliary: No focal liver abnormality is seen. No gallstones, gallbladder wall thickening, or biliary dilatation. Pancreas: Infiltrated with fat but otherwise unremarkable. Spleen: Normal in size without focal abnormality. Adrenals/Urinary Tract: LEFT-sided nephroureteral stent appears appropriately positioned with proximal tip at the level of the LEFT renal pelvis and distal tip within the bladder. LEFT kidney otherwise unremarkable. The large RIGHT renal mass is unchanged in the short-term interval, measured on today's exam it 10 x 7 cm. Again noted is adjacent retroperitoneal hemorrhage, not significantly changed compared to the previous study. The associated soft tissue gas within the collection is compatible with the given history of recent percutaneous needle biopsy. No new perinephric fluid. No RIGHT-sided hydronephrosis or hydroureter. Bladder is unremarkable. Stomach/Bowel: Bowel is normal in caliber. No bowel wall  thickening or evidence of bowel wall inflammation seen. Diverticulosis again noted within the upper sigmoid colon and descending colon but no focal inflammatory change to suggest acute diverticulitis. Stomach is unremarkable. Vascular/Lymphatic: No abdominal aortic aneurysm. No pathologically enlarged lymph nodes identified. Reproductive: Prostate is unremarkable. Other: No new intra-abdominal or intrapelvic fluid collection. No evidence of intraperitoneal abscess. Musculoskeletal: No acute or suspicious osseous finding. IMPRESSION: 1. No significant change compared to the CT abdomen and pelvis of 04/12/2018. Again noted is RIGHT-sided retroperitoneal hemorrhage adjacent to the large RIGHT renal mass, with some associated soft tissue gas, all of which is again compatible with sequela of recent percutaneous  needle biopsy. The retroperitoneal hemorrhage is not significantly changed in size or extent. No new perinephric fluid or inflammation to suggest concomitant pyelonephritis. 2. LEFT kidney is unremarkable, also without CT evidence of pyelonephritis. The LEFT-sided nephroureteral stent appears well positioned. No evidence of procedural complicating feature. 3. Colonic diverticulosis without evidence of acute diverticulitis. Electronically Signed   By: Franki Cabot M.D.   On: 04/19/2018 16:48   Dg Chest Port 1 View  Result Date: 04/19/2018 CLINICAL DATA:  Fever today, recent LEFT ureteral stent placement EXAM: PORTABLE CHEST 1 VIEW COMPARISON:  Portable exam 1524 hours compared to 03/28/2018 FINDINGS: Enlargement of cardiac silhouette. Mediastinal contours and pulmonary vascularity normal. Low lung volumes. No gross infiltrate, pleural effusion or pneumothorax. Osseous structures unremarkable. IMPRESSION: Enlargement of cardiac silhouette. No acute abnormalities. Electronically Signed   By: Lavonia Dana M.D.   On: 04/19/2018 15:49       Nicholus Chandran C 04/20/2018, 10:36 AM

## 2018-04-20 NOTE — Evaluation (Signed)
Physical Therapy Evaluation Patient Details Name: George Lewis MRN: [id number] DOB: February 11, 1956 Today's Date: 04/20/2018   History of Present Illness  62 y.o. male with medical history significant of recently diagnosed right renal cell carcinoma, history of pancreatic lesion awaiting EUS scheduled for 04/25/2018, history of gross hematuria status post cystoscopy and left retrograde pyelogram left ureteroscopy with left ureteral stent placed 04/16/2018 per urology, chronic kidney disease per patient, hypertension, gastroesophageal reflux disease, obstructive sleep apnea on CPAP machine presenting to the ED with a 2-day history of lower abdominal pain, dysuria  Clinical Impression  Pt ambulated 400' without an assistive device, no loss of balance, HR 132 max with walking, 112 after 3 minutes seated rest. No further PT indicated as pt is independent with mobility. Encouraged pt to ambulate in halls TID to minimize deconditioning while in hospital. PT signing off.     Follow Up Recommendations No PT follow up    Equipment Recommendations  None recommended by PT    Recommendations for Other Services       Precautions / Restrictions Precautions Precautions: Other (comment) Precaution Comments: HR already high (110's at start) up to 132 at highest with ambulation Restrictions Weight Bearing Restrictions: No      Mobility  Bed Mobility               General bed mobility comments: pt up in recliner upon arrival  Transfers Overall transfer level: Independent Equipment used: None                Ambulation/Gait Ambulation/Gait assistance: Independent Gait Distance (Feet): 400 Feet Assistive device: None Gait Pattern/deviations: WFL(Within Functional Limits)     General Gait Details: HR 122 at start of PT session (pt in recliner), 132 max with ambulation, no dyspnea nor loss of balance, pt reports feeling "weak" in general  Stairs            Wheelchair Mobility     Modified Rankin (Stroke Patients Only)       Balance Overall balance assessment: No apparent balance deficits (not formally assessed)                                           Pertinent Vitals/Pain Pain Assessment: 0-10 Pain Score: 1  Pain Location: right lower abdominal quadrant Pain Descriptors / Indicators: Sore Pain Intervention(s): Limited activity within patient's tolerance;Monitored during session    Home Living Family/patient expects to be discharged to:: Private residence Living Arrangements: Spouse/significant other Available Help at Discharge: Family Type of Home: House Home Access: Stairs to enter Entrance Stairs-Rails: Right Entrance Stairs-Number of Steps: 3 Home Layout: One level Home Equipment: None      Prior Function Level of Independence: Independent         Comments: 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 [employer]   Dominant Hand: Right    Extremity/Trunk Assessment   Upper Extremity Assessment Upper Extremity Assessment: Overall WFL for tasks assessed    Lower Extremity Assessment Lower Extremity Assessment: Overall WFL for tasks assessed    Cervical / Trunk Assessment Cervical / Trunk Assessment: Normal  Communication   Communication: No difficulties  Cognition Arousal/Alertness: Awake/alert Behavior During Therapy: WFL for tasks assessed/performed Overall Cognitive Status: Within Functional Limits for tasks assessed  General Comments      Exercises     Assessment/Plan    PT Assessment Patent does not need any further PT services  PT Problem List         PT Treatment Interventions      PT Goals (Current goals can be found in the Care Plan section)  Acute Rehab PT Goals Patient Stated Goal: home soon and would really like to go back to work Education administrator at [employer]) PT Goal Formulation: All assessment and education  complete, DC therapy    Frequency     Barriers to discharge        Co-evaluation   Reason for Co-Treatment: For patient/therapist safety   OT goals addressed during session: ADL's and self-care;Strengthening/ROM       AM-PAC PT "6 Clicks" Daily Activity  Outcome Measure Difficulty turning over in bed (including adjusting bedclothes, sheets and blankets)?: None Difficulty moving from lying on back to sitting on the side of the bed? : None Difficulty sitting down on and standing up from a chair with arms (e.g., wheelchair, bedside commode, etc,.)?: None Help needed moving to and from a bed to chair (including a wheelchair)?: None Help needed walking in hospital room?: None Help needed climbing 3-5 steps with a railing? : None 6 Click Score: 24    End of Session Equipment Utilized During Treatment: Gait belt Activity Tolerance: Patient tolerated treatment well Patient left: in chair;with call bell/phone within reach;with family/visitor present Nurse Communication: Mobility status      Time: 6837-2902 PT Time Calculation (min) (ACUTE ONLY): 16 min   Charges:   PT Evaluation $PT Eval Low Complexity: 1 Low            Philomena Doheny 04/20/2018, 10:33 AM [phone]

## 2018-04-20 NOTE — Progress Notes (Signed)
PROGRESS NOTE    Nain Rudd  PXT:[id number] DOB: 02-28-1956 DOA: 04/19/2018 PCP: Haydee Monica, [facility]    Brief Narrative:  Patient is a 62 year old gentleman history of recently diagnosed renal cell carcinoma, history of pancreatic lesion awaiting EUS scheduled for 04/25/2018, history of gross hematuria status post cystoscopy and left retrograde pyelogram left ureteroscopy with left ureteral stent placed 04/16/2018 per urology presented to the ED with a 1 to 2-day history of bilateral flank pain, lower abdominal pain, dysuria, fevers as high as 103, chills, generalized weakness and lightheadedness.  Patient admitted for sepsis work-up and pancultured.  Patient placed empirically on IV vancomycin and IV cefepime.  Urology consulted.   Assessment & Plan:   Principal Problem:   Sepsis ([place]) Active Problems:   CKD (chronic kidney disease)   Dehydration   UTI (urinary tract infection): Probable   Leukocytosis   Anemia   Renal cell carcinoma, right (HCC)   S/P ureteral stent placement: 04/16/2018   GERD (gastroesophageal reflux disease)   OSA on CPAP   HTN (hypertension)  1 sepsis likely secondary to UTI, concern for bacteremia secondary to recent urethral instrumentation. Patient presented with lower abdominal pain, dysuria, with a temperature as high as 102.8.  Patient noted to be tachycardic with heart rate as high as 135.  CBC done with a white count of 16.4 with a left shift.  Creatinine stable at 2.53. Urinalysis done with large leukocytes, negative nitrite, greater than 50 RBCs, greater than 50 WBCs, budding yeast present.  Blood cultures have been drawn and are pending.    Continue hydration with IV fluids and decrease to 75 cc/h due to history of chronic kidney disease.  Procalcitonin level was decreased.  Patient still with fevers.  Continue empiric IV cefepime and IV vancomycin.  If continued fevers despite broad-spectrum antibiotics will need to consult with ID for  further evaluation and management and may need to consider starting patient on antifungal agent.  Urology consulted and are following.    2.  History of kidney stones status post recent urethral stent placement 04/16/2018 Patient presenting with sepsis.  Concern for UTI as urinalysis consistent with a UTI.  Patient with recent instrumentation concerning for possible bacteremia.  Patient has been pancultured results are pending.  Continue empiric IV cefepime and IV vancomycin.  Urology consulted and are following.  3.  Gastroesophageal reflux disease Continue PPI.  4.  Dehydration Improving with hydration.  Decrease IV fluids to 75 cc/h.  Follow.  Monitor closely for volume overload.    5.  Chronic kidney disease Stable.  Patient maintained on home regimen of Flomax.  Monitor closely with gentle hydration.  6.  Obstructive sleep apnea CPAP nightly.  7.  Hypertension Stable.  Hydralazine as needed.   8.  Pancreatic lesion Patient is to have an EUS for further evaluation per gastroenterology on 04/25/2018.  Gastroenterology informed of patient's admission.  Outpatient follow-up.   9.  Right renal cell carcinoma Recently diagnosed.  Per urology.  Urology consulted and are following.    DVT prophylaxis: SCDs Code Status: Full Family Communication: Updated patient and wife at bedside. Disposition Plan: Remain in stepdown unit.   Consultants:   Urology: Dr. Karsten Ro  Procedures:   CT abdomen and pelvis 04/19/2018  Chest x-ray 04/19/2018    Antimicrobials:   IV vancomycin 04/19/2018  IV cefepime 04/19/2018   Subjective: Patient laying in bed.  Patient states he is feeling better than on admission.  States improvement with lower abdominal  pain.  No further chills.  T-max of 101.4 at 3:23 AM.  Objective: Vitals:   04/20/18 0547 04/20/18 0800 04/20/18 1029 04/20/18 1212  BP: (!) 147/70     Pulse: (!) 109 (!) 103 (!) 132   Resp: (!) 29 (!) 23    Temp:  (!) 100.7 F  (38.2 C)  (!) 100.7 F (38.2 C)  TempSrc:  Oral  Tympanic  SpO2: 97% 96%    Weight:      Height:        Intake/Output Summary (Last 24 hours) at 04/20/2018 1611 Last data filed at 04/20/2018 1500 Gross per 24 hour  Intake 2041.51 ml  Output 3100 ml  Net -1058.49 ml   Filed Weights   04/19/18 1434 04/19/18 1852 04/20/18 0500  Weight: (!) 145.2 kg (!) 146.9 kg (!) 149.3 kg    Examination:  General exam: Appears calm and comfortable  Respiratory system: Clear to auscultation. Respiratory effort normal. Cardiovascular system: S1 & S2 heard, RRR. No JVD, murmurs, rubs, gallops or clicks. No pedal edema. Gastrointestinal system: Abdomen is obese, soft, decreased tenderness to palpation in the lower abdomen, positive bowel sounds, no rebound, no guarding.   Central nervous system: Alert and oriented. No focal neurological deficits. Extremities: Symmetric 5 x 5 power. Skin: No rashes, lesions or ulcers Psychiatry: Judgement and insight appear normal. Mood & affect appropriate.     Data Reviewed: I have personally reviewed following labs and imaging studies  CBC: Recent Labs  Lab 04/19/18 1544 04/20/18 0339  WBC 16.4* 17.7*  NEUTROABS 12.6* 13.6*  HGB 11.6* 10.4*  HCT 35.7* 32.6*  MCV 86.2 87.2  PLT 260 903   Basic Metabolic Panel: Recent Labs  Lab 04/16/18 1200 04/19/18 1544 04/20/18 0339  NA 141 136 136  K 4.6 4.6 4.9  CL 108 103 105  CO2 20* 21* 24  GLUCOSE 113* 157* 147*  BUN 22* 33* 32*  CREATININE 2.34* 2.52* 2.53*  CALCIUM 8.9 8.9 8.5*   GFR: Estimated Creatinine Clearance: 47.9 mL/min (A) (by C-G formula based on SCr of 2.53 mg/dL (H)). Liver Function Tests: Recent Labs  Lab 04/19/18 1544 04/20/18 0339  AST 17 13*  ALT 13 11  ALKPHOS 76 64  BILITOT 0.6 0.8  PROT 7.5 6.7  ALBUMIN 3.5 3.0*   No results for input(s): LIPASE, AMYLASE in the last 168 hours. No results for input(s): AMMONIA in the last 168 hours. Coagulation Profile: No results  for input(s): INR, PROTIME in the last 168 hours. Cardiac Enzymes: No results for input(s): CKTOTAL, CKMB, CKMBINDEX, TROPONINI in the last 168 hours. BNP (last 3 results) No results for input(s): PROBNP in the last 8760 hours. HbA1C: No results for input(s): HGBA1C in the last 72 hours. CBG: Recent Labs  Lab 04/20/18 0807 04/20/18 1234  GLUCAP 142* 167*   Lipid Profile: No results for input(s): CHOL, HDL, LDLCALC, TRIG, CHOLHDL, LDLDIRECT in the last 72 hours. Thyroid Function Tests: No results for input(s): TSH, T4TOTAL, FREET4, T3FREE, THYROIDAB in the last 72 hours. Anemia Panel: Recent Labs    04/20/18 0339  VITAMINB12 215  FOLATE 7.5  FERRITIN 274  TIBC 260  IRON 19*   Sepsis Labs: Recent Labs  Lab 04/19/18 1535 04/19/18 2009 04/20/18 0339  PROCALCITON  --  1.17 1.58  LATICACIDVEN 1.62  --   --     Recent Results (from the past 240 hour(s))  Blood Culture (routine x 2)     Status: None (Preliminary result)  Collection Time: 04/19/18  3:25 PM  Result Value Ref Range Status   Specimen Description BLOOD RIGHT HAND  Final   Special Requests   Final    BOTTLES DRAWN AEROBIC AND ANAEROBIC Blood Culture adequate volume   Culture   Final    NO GROWTH < 12 HOURS Performed at [facility], [address], [place], [place] [address]    Report Status PENDING  Incomplete  MRSA PCR Screening     Status: None   Collection Time: 04/19/18  7:06 PM  Result Value Ref Range Status   MRSA by PCR NEGATIVE NEGATIVE Final    Comment:        The GeneXpert MRSA Assay (FDA approved for NASAL specimens only), is one component of a comprehensive MRSA colonization surveillance program. It is not intended to diagnose MRSA infection nor to guide or monitor treatment for MRSA infections. Performed at [facility], [place] [address], [place],  [address]          Radiology Studies: Ct Abdomen Pelvis Wo Contrast  Result Date:  04/19/2018 CLINICAL DATA:  Fever. LEFT-sided pain. LEFT-sided stent placed on Monday. Recent diagnosis of RIGHT renal cancer and possible metastasis to pancreas. Pyelonephritis, complicated. EXAM: CT ABDOMEN AND PELVIS WITHOUT CONTRAST TECHNIQUE: Multidetector CT imaging of the abdomen and pelvis was performed following the standard protocol without IV contrast. COMPARISON:  CT abdomen dated 04/12/2018. FINDINGS: Lower chest: No acute abnormality. Hepatobiliary: No focal liver abnormality is seen. No gallstones, gallbladder wall thickening, or biliary dilatation. Pancreas: Infiltrated with fat but otherwise unremarkable. Spleen: Normal in size without focal abnormality. Adrenals/Urinary Tract: LEFT-sided nephroureteral stent appears appropriately positioned with proximal tip at the level of the LEFT renal pelvis and distal tip within the bladder. LEFT kidney otherwise unremarkable. The large RIGHT renal mass is unchanged in the short-term interval, measured on today's exam it 10 x 7 cm. Again noted is adjacent retroperitoneal hemorrhage, not significantly changed compared to the previous study. The associated soft tissue gas within the collection is compatible with the given history of recent percutaneous needle biopsy. No new perinephric fluid. No RIGHT-sided hydronephrosis or hydroureter. Bladder is unremarkable. Stomach/Bowel: Bowel is normal in caliber. No bowel wall thickening or evidence of bowel wall inflammation seen. Diverticulosis again noted within the upper sigmoid colon and descending colon but no focal inflammatory change to suggest acute diverticulitis. Stomach is unremarkable. Vascular/Lymphatic: No abdominal aortic aneurysm. No pathologically enlarged lymph nodes identified. Reproductive: Prostate is unremarkable. Other: No new intra-abdominal or intrapelvic fluid collection. No evidence of intraperitoneal abscess. Musculoskeletal: No acute or suspicious osseous finding. IMPRESSION: 1. No  significant change compared to the CT abdomen and pelvis of 04/12/2018. Again noted is RIGHT-sided retroperitoneal hemorrhage adjacent to the large RIGHT renal mass, with some associated soft tissue gas, all of which is again compatible with sequela of recent percutaneous needle biopsy. The retroperitoneal hemorrhage is not significantly changed in size or extent. No new perinephric fluid or inflammation to suggest concomitant pyelonephritis. 2. LEFT kidney is unremarkable, also without CT evidence of pyelonephritis. The LEFT-sided nephroureteral stent appears well positioned. No evidence of procedural complicating feature. 3. Colonic diverticulosis without evidence of acute diverticulitis. Electronically Signed   By: Franki Cabot M.D.   On: 04/19/2018 16:48   Dg Chest Port 1 View  Result Date: 04/19/2018 CLINICAL DATA:  Fever today, recent LEFT ureteral stent placement EXAM: PORTABLE CHEST 1 VIEW COMPARISON:  Portable exam 1524 hours compared to 03/28/2018 FINDINGS: Enlargement of  cardiac silhouette. Mediastinal contours and pulmonary vascularity normal. Low lung volumes. No gross infiltrate, pleural effusion or pneumothorax. Osseous structures unremarkable. IMPRESSION: Enlargement of cardiac silhouette. No acute abnormalities. Electronically Signed   By: Lavonia Dana M.D.   On: 04/19/2018 15:49        Scheduled Meds: . allopurinol  300 mg Oral QPM  . feeding supplement  1 Container Oral Q24H  . feeding supplement (PRO-STAT SUGAR FREE 64)  30 mL Oral BID  . loratadine  10 mg Oral Daily  . pantoprazole  40 mg Oral Daily  . senna  1 tablet Oral BID  . sodium chloride flush  3 mL Intravenous Q12H  . tamsulosin  0.4 mg Oral QPM   Continuous Infusions: . sodium chloride 100 mL/hr at 04/20/18 0845  . ceFEPime (MAXIPIME) IV Stopped (04/20/18 0601)  . vancomycin       LOS: 1 day    Time spent: 40 minutes    Irine Seal, MD [facility] Pager [phone]  If 7PM-7AM, please  contact night-coverage [email or web address] Password [email or web address] 04/20/2018, 4:11 PM

## 2018-04-21 DIAGNOSIS — Z936 Other artificial openings of urinary tract status: Secondary | ICD-10-CM

## 2018-04-21 DIAGNOSIS — R509 Fever, unspecified: Secondary | ICD-10-CM

## 2018-04-21 DIAGNOSIS — Z880 Allergy status to penicillin: Secondary | ICD-10-CM

## 2018-04-21 DIAGNOSIS — K8689 Other specified diseases of pancreas: Secondary | ICD-10-CM

## 2018-04-21 DIAGNOSIS — C649 Malignant neoplasm of unspecified kidney, except renal pelvis: Secondary | ICD-10-CM

## 2018-04-21 LAB — CBC WITH DIFFERENTIAL/PLATELET
BASOS ABS: 0 10*3/uL (ref 0.0–0.1)
BASOS PCT: 0 %
EOS ABS: 0.4 10*3/uL (ref 0.0–0.7)
EOS PCT: 3 %
HCT: 31.1 % — ABNORMAL LOW (ref 39.0–52.0)
Hemoglobin: 9.8 g/dL — ABNORMAL LOW (ref 13.0–17.0)
Lymphocytes Relative: 9 %
Lymphs Abs: 1.3 10*3/uL (ref 0.7–4.0)
MCH: 27.5 pg (ref 26.0–34.0)
MCHC: 31.5 g/dL (ref 30.0–36.0)
MCV: 87.4 fL (ref 78.0–100.0)
Monocytes Absolute: 2.2 10*3/uL — ABNORMAL HIGH (ref 0.1–1.0)
Monocytes Relative: 15 %
Neutro Abs: 10.7 10*3/uL — ABNORMAL HIGH (ref 1.7–7.7)
Neutrophils Relative %: 73 %
PLATELETS: 263 10*3/uL (ref 150–400)
RBC: 3.56 MIL/uL — AB (ref 4.22–5.81)
RDW: 16.9 % — ABNORMAL HIGH (ref 11.5–15.5)
WBC: 14.6 10*3/uL — AB (ref 4.0–10.5)

## 2018-04-21 LAB — BASIC METABOLIC PANEL
ANION GAP: 7 (ref 5–15)
BUN: 41 mg/dL — ABNORMAL HIGH (ref 6–20)
CALCIUM: 8.3 mg/dL — AB (ref 8.9–10.3)
CO2: 22 mmol/L (ref 22–32)
Chloride: 110 mmol/L (ref 98–111)
Creatinine, Ser: 2.66 mg/dL — ABNORMAL HIGH (ref 0.61–1.24)
GFR, EST AFRICAN AMERICAN: 29 mL/min — AB (ref >60)
GFR, EST NON AFRICAN AMERICAN: 25 mL/min — AB (ref >60)
Glucose, Bld: 144 mg/dL — ABNORMAL HIGH (ref 70–99)
POTASSIUM: 4.5 mmol/L (ref 3.5–5.1)
SODIUM: 139 mmol/L (ref 135–145)

## 2018-04-21 LAB — GLUCOSE, CAPILLARY
GLUCOSE-CAPILLARY: 118 mg/dL — AB (ref 70–99)
GLUCOSE-CAPILLARY: 122 mg/dL — AB (ref 70–99)
Glucose-Capillary: 146 mg/dL — ABNORMAL HIGH (ref 70–99)

## 2018-04-21 LAB — URINE CULTURE

## 2018-04-21 LAB — PROCALCITONIN: PROCALCITONIN: 0.97 ng/mL

## 2018-04-21 MED ORDER — AMLODIPINE BESYLATE 5 MG PO TABS
5.0000 mg | ORAL_TABLET | Freq: Every day | ORAL | Status: DC
  Administered 2018-04-21 – 2018-04-23 (×3): 5 mg via ORAL
  Filled 2018-04-21 (×3): qty 1

## 2018-04-21 MED ORDER — SODIUM CHLORIDE 0.9 % IV SOLN
2.0000 g | INTRAVENOUS | Status: DC
  Administered 2018-04-22: 2 g via INTRAVENOUS
  Filled 2018-04-21: qty 2

## 2018-04-21 MED ORDER — AMLODIPINE BESYLATE 5 MG PO TABS: 5.0000 mg | ORAL_TABLET | Freq: Every day | ORAL | Status: DC

## 2018-04-21 NOTE — Consult Note (Signed)
[place] for Infectious Disease       Reason for Consult: fever    Referring Physician: Dr. Grandville Silos  Principal Problem:   Sepsis [facility]) Active Problems:   CKD (chronic kidney disease)   Dehydration   UTI (urinary tract infection): Probable   Leukocytosis   Anemia   Renal cell carcinoma, right (HCC)   S/P ureteral stent placement: 04/16/2018   GERD (gastroesophageal reflux disease)   OSA on CPAP   HTN (hypertension)   . allopurinol  300 mg Oral QPM  . amLODipine  5 mg Oral Daily  . feeding supplement  1 Container Oral Q24H  . feeding supplement (PRO-STAT SUGAR FREE 64)  30 mL Oral BID  . loratadine  10 mg Oral Daily  . pantoprazole  40 mg Oral Daily  . senna  1 tablet Oral BID  . sodium chloride flush  3 mL Intravenous Q12H  . tamsulosin  0.4 mg Oral QPM    Recommendations: Continue vancomycin and cefepime Monitor cultures  Remove stent when able  Assessment: He came in with fever, feeling poorly, chills following placement of left ureteral stent.  Unfortunately cultures have remained negative but he is improving on empiric vancomycin and cefepime with improving fever curve, decreasing WBC.  He should have stent removed as soon as possible as I do believe this is the cause of the fever.    Antibiotics: Vancomycin and cefepime day 3  HPI: George Lewis is a 62 y.o. male with recent diagnsosis of renal cell carcinoma and pancreatic lesion with scheduled EUS underwent recent cystoscopy and left retrograde pyelogram and left ureteral stent placed done on 04/16/18.  He came in on 8/22 with fever up to 102.8, significant flank pain and a WBC of 17.7.  He was placed on empiric vancomycin and cefepime and admitted.  Today, he feels much improved.  Unfortunately, the urine culture reports multiple species.  UA with WBCs and RBCs and negative nitrites.  Now eating and advancing diet this am.     Review of Systems:  Constitutional: positive for fatigue or negative for  chills Gastrointestinal: negative for diarrhea Musculoskeletal: negative for myalgias and arthralgias All other systems reviewed and are negative    Past Medical History:  Diagnosis Date  . Arthritis    knees  . [facility])    right kidney new dx tuesday 04-10-18  . GERD (gastroesophageal reflux disease)    hx of   . History of kidney stones   . Hypertension    off bp med lisinopril since june or july  . OSA on CPAP   . PE (pulmonary thromboembolism) ([place]) 03/2008  . S/P ureteral stent placement: 04/16/2018 04/19/2018    Social History   Tobacco Use  . Smoking status: Never Smoker  . Smokeless tobacco: Never Used  Substance Use Topics  . Alcohol use: Never    Frequency: Never  . Drug use: Never    Family History  Problem Relation Age of Onset  . Alzheimer's disease Mother   . Alzheimer's disease Father   . Heart disease Father     Allergies  Allergen Reactions  . Penicillins Rash and Other (See Comments)    Has patient had a PCN reaction causing immediate rash, facial/tongue/throat swelling, SOB or lightheadedness with hypotension: yes Has patient had a PCN reaction causing severe rash involving mucus membranes or skin necrosis: no Has patient had a PCN reaction that required hospitalization: no Has patient had a PCN reaction occurring within  the last 10 years: no If all of the above answers are "NO", then may proceed with Cephalosporin use.     Physical Exam: Constitutional: in no apparent distress and alert  Vitals:   04/21/18 0708 04/21/18 0800  BP: (!) 159/81 (!) 167/78  Pulse: 88 89  Resp: (!) 21 15  Temp:  97.9 F (36.6 C)  SpO2: 99% 98%   EYES: anicteric ENMT: no thrush Cardiovascular: Cor RRR Respiratory: CTA B; normal respiratory effort GI: Bowel sounds are normal, liver is not enlarged, spleen is not enlarged Musculoskeletal: no pedal edema noted Skin: negatives: no rash Hematologic: no cervical lad  Lab Results  Component Value Date    WBC 14.6 (H) 04/21/2018   HGB 9.8 (L) 04/21/2018   HCT 31.1 (L) 04/21/2018   MCV 87.4 04/21/2018   PLT 263 04/21/2018    Lab Results  Component Value Date   CREATININE 2.66 (H) 04/21/2018   BUN 41 (H) 04/21/2018   NA 139 04/21/2018   K 4.5 04/21/2018   CL 110 04/21/2018   CO2 22 04/21/2018    Lab Results  Component Value Date   ALT 11 04/20/2018   AST 13 (L) 04/20/2018   ALKPHOS 64 04/20/2018     Microbiology: Recent Results (from the past 240 hour(s))  Blood Culture (routine x 2)     Status: None (Preliminary result)   Collection Time: 04/19/18  3:25 PM  Result Value Ref Range Status   Specimen Description BLOOD RIGHT HAND  Final   Special Requests   Final    BOTTLES DRAWN AEROBIC AND ANAEROBIC Blood Culture adequate volume   Culture   Final    NO GROWTH 2 DAYS Performed at [facility], [address], [place], [place] [address]    Report Status PENDING  Incomplete  Urine culture     Status: Abnormal   Collection Time: 04/19/18  6:01 PM  Result Value Ref Range Status   Specimen Description   Final    URINE, CLEAN CATCH Performed at [facility], [place] [address], [place], [place] [address]    Special Requests   Final    NONE Performed at [facility], [place] [address], [place], [place] [address]    Culture MULTIPLE SPECIES PRESENT, SUGGEST RECOLLECTION (A)  Final   Report Status 04/21/2018 FINAL  Final  MRSA PCR Screening     Status: None   Collection Time: 04/19/18  7:06 PM  Result Value Ref Range Status   MRSA by PCR NEGATIVE NEGATIVE Final    Comment:        The GeneXpert MRSA Assay (FDA approved for NASAL specimens only), is one component of a comprehensive MRSA colonization surveillance program. It is not intended to diagnose MRSA infection nor to guide or monitor treatment for MRSA infections. Performed at [facility], [place] [address], [place], [place] [address]   Blood Culture (routine  x 2)     Status: None (Preliminary result)   Collection Time: 04/19/18  8:09 PM  Result Value Ref Range Status   Specimen Description   Final    BLOOD RIGHT ANTECUBITAL Performed at [place] [address] [place], [place] [address]    Special Requests   Final    BOTTLES DRAWN AEROBIC AND ANAEROBIC Blood Culture adequate volume Performed at [facility] [address], [place],  [address]    Culture   Final    NO GROWTH  1 DAY Performed at [facility], [place] [address] [place], [place] [address]    Report Status PENDING  Incomplete    Thayer Headings, [name] for Infectious Disease [place] [email or web address] pager  [phone] cell 04/21/2018, 11:57 AM

## 2018-04-21 NOTE — Progress Notes (Signed)
PROGRESS NOTE    George Lewis  GUY:[id number] DOB: Aug 13, 1956 DOA: 04/19/2018 PCP: Haydee Monica, [facility]    Brief Narrative:  Patient is a 62 year old gentleman history of recently diagnosed renal cell carcinoma, history of pancreatic lesion awaiting EUS scheduled for 04/25/2018, history of gross hematuria status post cystoscopy and left retrograde pyelogram left ureteroscopy with left ureteral stent placed 04/16/2018 per urology presented to the ED with a 1 to 2-day history of bilateral flank pain, lower abdominal pain, dysuria, fevers as high as 103, chills, generalized weakness and lightheadedness.  Patient admitted for sepsis work-up and pancultured.  Patient placed empirically on IV vancomycin and IV cefepime.  Urology consulted.   Assessment & Plan:   Principal Problem:   Sepsis ([place]) Active Problems:   CKD (chronic kidney disease)   Dehydration   UTI (urinary tract infection): Probable   Leukocytosis   Anemia   Renal cell carcinoma, right (HCC)   S/P ureteral stent placement: 04/16/2018   GERD (gastroesophageal reflux disease)   OSA on CPAP   HTN (hypertension)  1 sepsis likely secondary to UTI, concern for bacteremia secondary to recent urethral instrumentation. Patient presented with lower abdominal pain, dysuria, with a temperature as high as 102.8.  Patient noted to be tachycardic with heart rate as high as 135 on admission.  CBC done with a white count of 16.4 with a left shift.  Creatinine stable at 2.66. Urinalysis done with large leukocytes, negative nitrite, greater than 50 RBCs, greater than 50 WBCs, budding yeast present.  Blood cultures have been drawn and are pending.    Urine cultures with multiple species.  Patient still with fevers with a temperature of 101.4 last night.  Patient improving somewhat clinically.  Saline lock IV fluids.  Procalcitonin level at 0.97 this morning.  May need stent to be removed in light of septic picture however patient per  urology for stent removal next week.  Patient still with fevers.  Continue empiric IV cefepime and IV vancomycin.  Due to continued fevers despite broad-spectrum antibiotics, will need with ID for further evaluation and management.  ??  Addition of antifungal agent however will defer to ID.  Urology following.    2.  History of kidney stones status post recent urethral stent placement 04/16/2018 Patient presented with sepsis with recent instrumentation concerning for possible bacteremia.. Concern for UTI as urinalysis consistent with a UTI. Patient has been pancultured.  Urine cultures with multiple species however patient with recent instrumentation and as such we will continue empiric IV antibiotics.  Blood cultures pending. Continue empiric IV cefepime and IV vancomycin.  Urology consulted and are following.  3.  Gastroesophageal reflux disease Continue PPI.  4.  Dehydration Improving with hydration.  Saline lock IV fluids.   5.  Chronic kidney disease Stable.  Unknown baseline.  Patient maintained on home regimen of Flomax.  Saline lock IV fluids.  Monitor renal function while on IV vancomycin.  Follow.  6.  Obstructive sleep apnea CPAP nightly.  7.  Hypertension Blood pressure seems to be elevating.  Saline lock IV fluids.  Start Norvasc 5 mg daily.  Hydralazine as needed.   8.  Pancreatic lesion Patient is to have an EUS for further evaluation per gastroenterology on 04/25/2018.  Gastroenterology informed of patient's admission.  Outpatient follow-up.   9.  Right renal cell carcinoma Recently diagnosed.  Per urology.  Urology consulted and are following.    DVT prophylaxis: SCDs Code Status: Full Family Communication: Updated patient.  No family at bedside.   Disposition Plan: Transfer to telemetry.     Consultants:   Urology: Dr. Karsten Ro  Procedures:   CT abdomen and pelvis 04/19/2018  Chest x-ray 04/19/2018    Antimicrobials:   IV vancomycin  04/19/2018  IV cefepime 04/19/2018   Subjective: Patient laying in bed.  Patient noted to have a fever of 101.4 last night.  Patient asking for fluid restriction to be removed.  Patient states he has been peeing a lot.  Denies any chest pain.  No shortness of breath.  No chills.  States he is feeling better than on admission.  States dysuria has improved.   Objective: Vitals:   04/21/18 0400 04/21/18 0500 04/21/18 0708 04/21/18 0800  BP:   (!) 159/81 (!) 167/78  Pulse: 84  88 89  Resp: (!) 27  (!) 21 15  Temp:    97.9 F (36.6 C)  TempSrc:    Oral  SpO2: 99%  99% 98%  Weight:  (!) 143.4 kg    Height:        Intake/Output Summary (Last 24 hours) at 04/21/2018 0931 Last data filed at 04/21/2018 6767 Gross per 24 hour  Intake 3756.86 ml  Output 4275 ml  Net -518.14 ml   Filed Weights   04/19/18 1852 04/20/18 0500 04/21/18 0500  Weight: (!) 146.9 kg (!) 149.3 kg (!) 143.4 kg    Examination:  General exam: NAD Respiratory system: CTAB.  No wheezes, no crackles, no rhonchi.  Normal respiratory effort.   Cardiovascular system: Regular rate rhythm no murmurs rubs or gallops.  No JVD.  No lower extremity edema. Gastrointestinal system: Abdomen is obese, soft, positive bowel sounds, nontender to palpation.  No rebound.  No guarding.  Central nervous system: Alert and oriented. No focal neurological deficits. Extremities: Symmetric 5 x 5 power. Skin: No rashes, lesions or ulcers Psychiatry: Judgement and insight appear normal. Mood & affect appropriate.     Data Reviewed: I have personally reviewed following labs and imaging studies  CBC: Recent Labs  Lab 04/19/18 1544 04/20/18 0339 04/21/18 0337  WBC 16.4* 17.7* 14.6*  NEUTROABS 12.6* 13.6* 10.7*  HGB 11.6* 10.4* 9.8*  HCT 35.7* 32.6* 31.1*  MCV 86.2 87.2 87.4  PLT 260 264 209   Basic Metabolic Panel: Recent Labs  Lab 04/16/18 1200 04/19/18 1544 04/20/18 0339 04/21/18 0337  NA 141 136 136 139  K 4.6 4.6 4.9 4.5   CL 108 103 105 110  CO2 20* 21* 24 22  GLUCOSE 113* 157* 147* 144*  BUN 22* 33* 32* 41*  CREATININE 2.34* 2.52* 2.53* 2.66*  CALCIUM 8.9 8.9 8.5* 8.3*   GFR: Estimated Creatinine Clearance: 44.5 mL/min (A) (by C-G formula based on SCr of 2.66 mg/dL (H)). Liver Function Tests: Recent Labs  Lab 04/19/18 1544 04/20/18 0339  AST 17 13*  ALT 13 11  ALKPHOS 76 64  BILITOT 0.6 0.8  PROT 7.5 6.7  ALBUMIN 3.5 3.0*   No results for input(s): LIPASE, AMYLASE in the last 168 hours. No results for input(s): AMMONIA in the last 168 hours. Coagulation Profile: No results for input(s): INR, PROTIME in the last 168 hours. Cardiac Enzymes: No results for input(s): CKTOTAL, CKMB, CKMBINDEX, TROPONINI in the last 168 hours. BNP (last 3 results) No results for input(s): PROBNP in the last 8760 hours. HbA1C: No results for input(s): HGBA1C in the last 72 hours. CBG: Recent Labs  Lab 04/20/18 0807 04/20/18 1234 04/20/18 2114 04/21/18 0803  GLUCAP 142*  167* 130* 118*   Lipid Profile: No results for input(s): CHOL, HDL, LDLCALC, TRIG, CHOLHDL, LDLDIRECT in the last 72 hours. Thyroid Function Tests: No results for input(s): TSH, T4TOTAL, FREET4, T3FREE, THYROIDAB in the last 72 hours. Anemia Panel: Recent Labs    04/20/18 0339  VITAMINB12 215  FOLATE 7.5  FERRITIN 274  TIBC 260  IRON 19*   Sepsis Labs: Recent Labs  Lab 04/19/18 1535 04/19/18 2009 04/20/18 0339 04/21/18 0337  PROCALCITON  --  1.17 1.58 0.97  LATICACIDVEN 1.62  --   --   --     Recent Results (from the past 240 hour(s))  Blood Culture (routine x 2)     Status: None (Preliminary result)   Collection Time: 04/19/18  3:25 PM  Result Value Ref Range Status   Specimen Description BLOOD RIGHT HAND  Final   Special Requests   Final    BOTTLES DRAWN AEROBIC AND ANAEROBIC Blood Culture adequate volume   Culture   Final    NO GROWTH < 12 HOURS Performed at [facility], [place] [address], [place], [place]  [address]    Report Status PENDING  Incomplete  Urine culture     Status: Abnormal   Collection Time: 04/19/18  6:01 PM  Result Value Ref Range Status   Specimen Description   Final    URINE, CLEAN CATCH Performed at [facility], [place] [address] [place], [place] [address]    Special Requests   Final    NONE Performed at [facility], [place] [address], [place], [place] [address]    Culture MULTIPLE SPECIES PRESENT, SUGGEST RECOLLECTION (A)  Final   Report Status 04/21/2018 FINAL  Final  MRSA PCR Screening     Status: None   Collection Time: 04/19/18  7:06 PM  Result Value Ref Range Status   MRSA by PCR NEGATIVE NEGATIVE Final    Comment:        The GeneXpert MRSA Assay (FDA approved for NASAL specimens only), is one component of a comprehensive MRSA colonization surveillance program. It is not intended to diagnose MRSA infection nor to guide or monitor treatment for MRSA infections. Performed at [facility], [place] [address], [place], [place] [address]          Radiology Studies: Ct Abdomen Pelvis Wo Contrast  Result Date: 04/19/2018 CLINICAL DATA:  Fever. LEFT-sided pain. LEFT-sided stent placed on Monday. Recent diagnosis of RIGHT renal cancer and possible metastasis to pancreas. Pyelonephritis, complicated. EXAM: CT ABDOMEN AND PELVIS WITHOUT CONTRAST TECHNIQUE: Multidetector CT imaging of the abdomen and pelvis was performed following the standard protocol without IV contrast. COMPARISON:  CT abdomen dated 04/12/2018. FINDINGS: Lower chest: No acute abnormality. Hepatobiliary: No focal liver abnormality is seen. No gallstones, gallbladder wall thickening, or biliary dilatation. Pancreas: Infiltrated with fat but otherwise unremarkable. Spleen: Normal in size without focal abnormality. Adrenals/Urinary Tract: LEFT-sided nephroureteral stent appears appropriately positioned with proximal tip at the level of the LEFT renal  pelvis and distal tip within the bladder. LEFT kidney otherwise unremarkable. The large RIGHT renal mass is unchanged in the short-term interval, measured on today's exam it 10 x 7 cm. Again noted is adjacent retroperitoneal hemorrhage, not significantly changed compared to the previous study. The associated soft tissue gas within the collection is compatible with the given history of recent percutaneous needle biopsy. No new perinephric fluid. No RIGHT-sided hydronephrosis or hydroureter. Bladder is unremarkable. Stomach/Bowel: Bowel is normal in caliber. No  bowel wall thickening or evidence of bowel wall inflammation seen. Diverticulosis again noted within the upper sigmoid colon and descending colon but no focal inflammatory change to suggest acute diverticulitis. Stomach is unremarkable. Vascular/Lymphatic: No abdominal aortic aneurysm. No pathologically enlarged lymph nodes identified. Reproductive: Prostate is unremarkable. Other: No new intra-abdominal or intrapelvic fluid collection. No evidence of intraperitoneal abscess. Musculoskeletal: No acute or suspicious osseous finding. IMPRESSION: 1. No significant change compared to the CT abdomen and pelvis of 04/12/2018. Again noted is RIGHT-sided retroperitoneal hemorrhage adjacent to the large RIGHT renal mass, with some associated soft tissue gas, all of which is again compatible with sequela of recent percutaneous needle biopsy. The retroperitoneal hemorrhage is not significantly changed in size or extent. No new perinephric fluid or inflammation to suggest concomitant pyelonephritis. 2. LEFT kidney is unremarkable, also without CT evidence of pyelonephritis. The LEFT-sided nephroureteral stent appears well positioned. No evidence of procedural complicating feature. 3. Colonic diverticulosis without evidence of acute diverticulitis. Electronically Signed   By: Franki Cabot M.D.   On: 04/19/2018 16:48   Dg Chest Port 1 View  Result Date:  04/19/2018 CLINICAL DATA:  Fever today, recent LEFT ureteral stent placement EXAM: PORTABLE CHEST 1 VIEW COMPARISON:  Portable exam 1524 hours compared to 03/28/2018 FINDINGS: Enlargement of cardiac silhouette. Mediastinal contours and pulmonary vascularity normal. Low lung volumes. No gross infiltrate, pleural effusion or pneumothorax. Osseous structures unremarkable. IMPRESSION: Enlargement of cardiac silhouette. No acute abnormalities. Electronically Signed   By: Lavonia Dana M.D.   On: 04/19/2018 15:49        Scheduled Meds: . allopurinol  300 mg Oral QPM  . feeding supplement  1 Container Oral Q24H  . feeding supplement (PRO-STAT SUGAR FREE 64)  30 mL Oral BID  . loratadine  10 mg Oral Daily  . pantoprazole  40 mg Oral Daily  . senna  1 tablet Oral BID  . sodium chloride flush  3 mL Intravenous Q12H  . tamsulosin  0.4 mg Oral QPM   Continuous Infusions: . sodium chloride 250 mL (04/21/18 0655)  . [START ON 04/22/2018] ceFEPime (MAXIPIME) IV    . vancomycin Stopped (04/20/18 2219)     LOS: 2 days    Time spent: 40 minutes    Irine Seal, MD [facility] Pager [phone]  If 7PM-7AM, please contact night-coverage [email or web address] Password [facility] 04/21/2018, 9:31 AM

## 2018-04-22 DIAGNOSIS — R7881 Bacteremia: Secondary | ICD-10-CM

## 2018-04-22 DIAGNOSIS — B952 Enterococcus as the cause of diseases classified elsewhere: Secondary | ICD-10-CM

## 2018-04-22 LAB — BLOOD CULTURE ID PANEL (REFLEXED)
Acinetobacter baumannii: NOT DETECTED
CANDIDA TROPICALIS: NOT DETECTED
Candida albicans: NOT DETECTED
Candida glabrata: NOT DETECTED
Candida krusei: NOT DETECTED
Candida parapsilosis: NOT DETECTED
ENTEROCOCCUS SPECIES: DETECTED — AB
Enterobacter cloacae complex: NOT DETECTED
Enterobacteriaceae species: NOT DETECTED
Escherichia coli: NOT DETECTED
Haemophilus influenzae: NOT DETECTED
KLEBSIELLA PNEUMONIAE: NOT DETECTED
Klebsiella oxytoca: NOT DETECTED
LISTERIA MONOCYTOGENES: NOT DETECTED
NEISSERIA MENINGITIDIS: NOT DETECTED
PROTEUS SPECIES: NOT DETECTED
Pseudomonas aeruginosa: NOT DETECTED
SERRATIA MARCESCENS: NOT DETECTED
STAPHYLOCOCCUS SPECIES: NOT DETECTED
STREPTOCOCCUS AGALACTIAE: NOT DETECTED
STREPTOCOCCUS SPECIES: NOT DETECTED
Staphylococcus aureus (BCID): NOT DETECTED
Streptococcus pneumoniae: NOT DETECTED
Streptococcus pyogenes: NOT DETECTED
VANCOMYCIN RESISTANCE: NOT DETECTED

## 2018-04-22 LAB — CBC WITH DIFFERENTIAL/PLATELET
BASOS PCT: 0 %
Basophils Absolute: 0 10*3/uL (ref 0.0–0.1)
EOS ABS: 0.5 10*3/uL (ref 0.0–0.7)
Eosinophils Relative: 4 %
HCT: 32.4 % — ABNORMAL LOW (ref 39.0–52.0)
HEMOGLOBIN: 10.4 g/dL — AB (ref 13.0–17.0)
LYMPHS ABS: 1.7 10*3/uL (ref 0.7–4.0)
Lymphocytes Relative: 13 %
MCH: 27.8 pg (ref 26.0–34.0)
MCHC: 32.1 g/dL (ref 30.0–36.0)
MCV: 86.6 fL (ref 78.0–100.0)
Monocytes Absolute: 1.4 10*3/uL — ABNORMAL HIGH (ref 0.1–1.0)
Monocytes Relative: 11 %
NEUTROS PCT: 72 %
Neutro Abs: 9.7 10*3/uL — ABNORMAL HIGH (ref 1.7–7.7)
Platelets: 292 10*3/uL (ref 150–400)
RBC: 3.74 MIL/uL — AB (ref 4.22–5.81)
RDW: 16.8 % — ABNORMAL HIGH (ref 11.5–15.5)
WBC: 13.4 10*3/uL — AB (ref 4.0–10.5)

## 2018-04-22 LAB — BASIC METABOLIC PANEL
Anion gap: 10 (ref 5–15)
BUN: 45 mg/dL — ABNORMAL HIGH (ref 6–20)
CALCIUM: 8.9 mg/dL (ref 8.9–10.3)
CHLORIDE: 111 mmol/L (ref 98–111)
CO2: 20 mmol/L — ABNORMAL LOW (ref 22–32)
CREATININE: 2.57 mg/dL — AB (ref 0.61–1.24)
GFR calc non Af Amer: 26 mL/min — ABNORMAL LOW (ref >60)
GFR, EST AFRICAN AMERICAN: 30 mL/min — AB (ref >60)
Glucose, Bld: 117 mg/dL — ABNORMAL HIGH (ref 70–99)
Potassium: 4.2 mmol/L (ref 3.5–5.1)
Sodium: 141 mmol/L (ref 135–145)

## 2018-04-22 NOTE — Progress Notes (Signed)
[facility] for Infectious Disease   Reason for visit: Follow up on fever  Interval History: continues to remain afebrile, blood culture now with 1/2 with Enterococcus.  WBC 13.  Hopeful to go home soon.  Tolerating po intake.     Physical Exam: Constitutional:  Vitals:   04/22/18 0235 04/22/18 0510  BP: 132/74 127/77  Pulse: 92 88  Resp: 18 18  Temp: 98.7 F (37.1 C) 98.4 F (36.9 C)  SpO2: 97% 97%   patient appears in NAD, up in chair HENT: no thrush Respiratory: Normal respiratory effort; CTA B Cardiovascular: RRR GI: soft, nt, nd  Review of Systems: Constitutional: negative for fevers, chills and anorexia Gastrointestinal: negative for nausea and diarrhea Integument/breast: negative for rash  Lab Results  Component Value Date   WBC 13.4 (H) 04/22/2018   HGB 10.4 (L) 04/22/2018   HCT 32.4 (L) 04/22/2018   MCV 86.6 04/22/2018   PLT 292 04/22/2018    Lab Results  Component Value Date   CREATININE 2.57 (H) 04/22/2018   BUN 45 (H) 04/22/2018   NA 141 04/22/2018   K 4.2 04/22/2018   CL 111 04/22/2018   CO2 20 (L) 04/22/2018    Lab Results  Component Value Date   ALT 11 04/20/2018   AST 13 (L) 04/20/2018   ALKPHOS 64 04/20/2018     Microbiology: Recent Results (from the past 240 hour(s))  Blood Culture (routine x 2)     Status: None (Preliminary result)   Collection Time: 04/19/18  3:25 PM  Result Value Ref Range Status   Specimen Description BLOOD RIGHT HAND  Final   Special Requests   Final    BOTTLES DRAWN AEROBIC AND ANAEROBIC Blood Culture adequate volume   Culture  Setup Time   Final    GRAM POSITIVE COCCI AEROBIC BOTTLE ONLY CRITICAL RESULT CALLED TO, READ BACK BY AND VERIFIED WITH: B.GREEN,PHARMD AT 0510 ON 04/22/18 BY [email or web address]    Culture   Final    TOO YOUNG TO READ Performed at [facility], [address] [place], [place] [address]    Report Status PENDING  Incomplete  Blood Culture ID Panel (Reflexed)     Status: Abnormal     Collection Time: 04/19/18  3:25 PM  Result Value Ref Range Status   Enterococcus species DETECTED (A) NOT DETECTED Final    Comment: CRITICAL RESULT CALLED TO, READ BACK BY AND VERIFIED WITH: B.GREEN,PHARMD AT 0510 ON 04/22/18 BY [email or web address]    Vancomycin resistance NOT DETECTED NOT DETECTED Final   Listeria monocytogenes NOT DETECTED NOT DETECTED Final   Staphylococcus species NOT DETECTED NOT DETECTED Final   Staphylococcus aureus NOT DETECTED NOT DETECTED Final   Streptococcus species NOT DETECTED NOT DETECTED Final   Streptococcus agalactiae NOT DETECTED NOT DETECTED Final   Streptococcus pneumoniae NOT DETECTED NOT DETECTED Final   Streptococcus pyogenes NOT DETECTED NOT DETECTED Final   Acinetobacter baumannii NOT DETECTED NOT DETECTED Final   Enterobacteriaceae species NOT DETECTED NOT DETECTED Final   Enterobacter cloacae complex NOT DETECTED NOT DETECTED Final   Escherichia coli NOT DETECTED NOT DETECTED Final   Klebsiella oxytoca NOT DETECTED NOT DETECTED Final   Klebsiella pneumoniae NOT DETECTED NOT DETECTED Final   Proteus species NOT DETECTED NOT DETECTED Final   Serratia marcescens NOT DETECTED NOT DETECTED Final   Haemophilus influenzae NOT DETECTED NOT DETECTED Final   Neisseria meningitidis NOT DETECTED NOT DETECTED Final   Pseudomonas aeruginosa NOT DETECTED NOT DETECTED Final  Candida albicans NOT DETECTED NOT DETECTED Final   Candida glabrata NOT DETECTED NOT DETECTED Final   Candida krusei NOT DETECTED NOT DETECTED Final   Candida parapsilosis NOT DETECTED NOT DETECTED Final   Candida tropicalis NOT DETECTED NOT DETECTED Final    Comment: Performed at [facility], [place] [address] [place], [place] [address]  Urine culture     Status: Abnormal   Collection Time: 04/19/18  6:01 PM  Result Value Ref Range Status   Specimen Description   Final    URINE, CLEAN CATCH Performed at [facility], [place] [address], [place], [place] [address]     Special Requests   Final    NONE Performed at [facility], [place] [address] [place], [place] [address]    Culture MULTIPLE SPECIES PRESENT, SUGGEST RECOLLECTION (A)  Final   Report Status 04/21/2018 FINAL  Final  MRSA PCR Screening     Status: None   Collection Time: 04/19/18  7:06 PM  Result Value Ref Range Status   MRSA by PCR NEGATIVE NEGATIVE Final    Comment:        The GeneXpert MRSA Assay (FDA approved for NASAL specimens only), is one component of a comprehensive MRSA colonization surveillance program. It is not intended to diagnose MRSA infection nor to guide or monitor treatment for MRSA infections. Performed at [facility], [place] [address] [place], [place] [address]   Blood Culture (routine x 2)     Status: None (Preliminary result)   Collection Time: 04/19/18  8:09 PM  Result Value Ref Range Status   Specimen Description   Final    BLOOD RIGHT ANTECUBITAL Performed at [place] [address] [place], [place] [address]    Special Requests   Final    BOTTLES DRAWN AEROBIC AND ANAEROBIC Blood Culture adequate volume Performed at [place] [address], [place], [place] [address]    Culture   Final    NO GROWTH 1 DAY Performed at [facility], [place] [address] [place], [place] [address]    Report Status PENDING  Incomplete    Impression/Plan:  1. Fever - doing well and resolved now.    2.  Bacteremia - s/p stent placement and with Enterococcus in blood culture c/w infection.  Has improved on empiric therapy including vancomycin.  No sensitivities yet but allergic to penicillin.  Can continue with vancomycin and can discharge on linezolid 600 mg twice a day until stents are removed + 1-2 days (likely Wednesday).   Will need to assure coverage of linezolid prior to discharge.   OK from ID standpoint to discharge without waiting for sensitivities.  I would not expect  linezolid resistance.    3.  Stent placement - plan for removal this week.  Spoke with Dr. Junious Silk.   I will sign off, thanks for consultation.

## 2018-04-22 NOTE — Progress Notes (Signed)
Brief GI Progress Note  I was informed by the hospitalist service about this patient having been admitted to the hospital with septic physiology post ureteral stent placement. 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 He is doing better today per his report and looks like no fevers for 24H. I think as long as there is not persistent concern for septic physiology and hemodynamically he is stable and not having fevers for at least 48 hours, it would not be unreasonable to consider performing his EUS.  However, if he is not doing well, then I would rather not cloud the picture with Anesthesia having to be administered and the small but possible risks of Pancreatitis if a biopsy are performed if he is not ready. I will discuss with my partner who does EUS, should we need to reschedule his EUS into the first week of September about availability. Patient and wife were appreciative of our conversation and desire to do what is most safe for him overall. The risks of EUS including bleeding, infection, aspiration pneumonia and intestinal perforation were discussed as was the possibility it may not give a definitive diagnosis.  If a biopsy of the pancreas is done as part of the EUS, there is an additional risk of pancreatitis at the rate of about 1%.  It was explained that procedure related pancreatitis is typically mild, although can be severe and even life threatening, which is why we do not perform random pancreatic biopsies and only biopsy a lesion we feel is concerning enough to warrant the risk. The risks and benefits of endoscopic evaluation were discussed with the patient; these include but are not limited to the risk of perforation, infection, bleeding, missed lesions, lack of diagnosis, severe illness requiring hospitalization, as well as anesthesia and  sedation related illnesses.  The patient is agreeable to proceed when we believe he is stable. He will not be formally followed by the GI service, but please reach out to them and/or reach out to me and we can see what our plan is on Tuesday in the afternoon to plan for the next day or not. Thank you for letting me know about him being in the hospital and for the great care he is receiving.  Justice Britain, MD [facility]oscopy Office # [phone]

## 2018-04-22 NOTE — Progress Notes (Signed)
Subjective: Patient reports he is feeling better.  He was having some dysuria but that has cleared.  He has no significant flank pain.  His blood cultures are positive for enterococcus.  Objective: Vital signs in last 24 hours: Temp:  [98.4 F (36.9 C)-99 F (37.2 C)] 98.5 F (36.9 C) (08/25 1241) Pulse Rate:  [88-95] 95 (08/25 1241) Resp:  [18-21] 21 (08/25 1241) BP: (127-136)/(74-79) 131/79 (08/25 1241) SpO2:  [97 %-98 %] 98 % (08/25 1241)  Intake/Output from previous day: 08/24 0701 - 08/25 0700 In: 720 [P.O.:720] Out: 4900 [Urine:4900] Intake/Output this shift: Total I/O In: 440 [P.O.:440] Out: 500 [Urine:500]  Physical Exam:  No acute distress, sitting in chair watching TV Abdomen soft and nontender Extremity-no calf pain or swelling  Lab Results: Recent Labs    04/20/18 0339 04/21/18 0337 04/22/18 0506  HGB 10.4* 9.8* 10.4*  HCT 32.6* 31.1* 32.4*   BMET Recent Labs    04/21/18 0337 04/22/18 0506  NA 139 141  K 4.5 4.2  CL 110 111  CO2 22 20*  GLUCOSE 144* 117*  BUN 41* 45*  CREATININE 2.66* 2.57*  CALCIUM 8.3* 8.9   No results for input(s): LABPT, INR in the last 72 hours. No results for input(s): LABURIN in the last 72 hours. Results for orders placed or performed during the hospital encounter of 04/19/18  Blood Culture (routine x 2)     Status: None (Preliminary result)   Collection Time: 04/19/18  3:25 PM  Result Value Ref Range Status   Specimen Description BLOOD RIGHT HAND  Final   Special Requests   Final    BOTTLES DRAWN AEROBIC AND ANAEROBIC Blood Culture adequate volume   Culture  Setup Time   Final    GRAM POSITIVE COCCI AEROBIC BOTTLE ONLY CRITICAL RESULT CALLED TO, READ BACK BY AND VERIFIED WITH: B.GREEN,PHARMD AT 0510 ON 04/22/18 BY G.MCADOO    Culture   Final    TOO YOUNG TO READ Performed at [facility], [place] [address], [place],  [address]    Report Status PENDING  Incomplete  Blood Culture ID Panel (Reflexed)      Status: Abnormal   Collection Time: 04/19/18  3:25 PM  Result Value Ref Range Status   Enterococcus species DETECTED (A) NOT DETECTED Final    Comment: CRITICAL RESULT CALLED TO, READ BACK BY AND VERIFIED WITH: B.GREEN,PHARMD AT 0510 ON 04/22/18 BY G.MCADOO    Vancomycin resistance NOT DETECTED NOT DETECTED Final   Listeria monocytogenes NOT DETECTED NOT DETECTED Final   Staphylococcus species NOT DETECTED NOT DETECTED Final   Staphylococcus aureus NOT DETECTED NOT DETECTED Final   Streptococcus species NOT DETECTED NOT DETECTED Final   Streptococcus agalactiae NOT DETECTED NOT DETECTED Final   Streptococcus pneumoniae NOT DETECTED NOT DETECTED Final   Streptococcus pyogenes NOT DETECTED NOT DETECTED Final   Acinetobacter baumannii NOT DETECTED NOT DETECTED Final   Enterobacteriaceae species NOT DETECTED NOT DETECTED Final   Enterobacter cloacae complex NOT DETECTED NOT DETECTED Final   Escherichia coli NOT DETECTED NOT DETECTED Final   Klebsiella oxytoca NOT DETECTED NOT DETECTED Final   Klebsiella pneumoniae NOT DETECTED NOT DETECTED Final   Proteus species NOT DETECTED NOT DETECTED Final   Serratia marcescens NOT DETECTED NOT DETECTED Final   Haemophilus influenzae NOT DETECTED NOT DETECTED Final   Neisseria meningitidis NOT DETECTED NOT DETECTED Final   Pseudomonas aeruginosa NOT DETECTED NOT DETECTED Final   Candida albicans NOT DETECTED NOT DETECTED Final  Candida glabrata NOT DETECTED NOT DETECTED Final   Candida krusei NOT DETECTED NOT DETECTED Final   Candida parapsilosis NOT DETECTED NOT DETECTED Final   Candida tropicalis NOT DETECTED NOT DETECTED Final    Comment: Performed at [facility], [place] [address], [place], [place] [address]  Urine culture     Status: Abnormal   Collection Time: 04/19/18  6:01 PM  Result Value Ref Range Status   Specimen Description   Final    URINE, CLEAN CATCH Performed at [facility], [place] [address],  [place], [place] [address]    Special Requests   Final    NONE Performed at [facility], [place] [address], [place], [place] [address]    Culture MULTIPLE SPECIES PRESENT, SUGGEST RECOLLECTION (A)  Final   Report Status 04/21/2018 FINAL  Final  MRSA PCR Screening     Status: None   Collection Time: 04/19/18  7:06 PM  Result Value Ref Range Status   MRSA by PCR NEGATIVE NEGATIVE Final    Comment:        The GeneXpert MRSA Assay (FDA approved for NASAL specimens only), is one component of a comprehensive MRSA colonization surveillance program. It is not intended to diagnose MRSA infection nor to guide or monitor treatment for MRSA infections. Performed at [facility], [place] [address] [place], [place] [address]   Blood Culture (routine x 2)     Status: None (Preliminary result)   Collection Time: 04/19/18  8:09 PM  Result Value Ref Range Status   Specimen Description   Final    BLOOD RIGHT ANTECUBITAL Performed at [place] [address], [place], [place] [address]    Special Requests   Final    BOTTLES DRAWN AEROBIC AND ANAEROBIC Blood Culture adequate volume Performed at [place] [address], [place], [place] [address]    Culture   Final    NO GROWTH 1 DAY Performed at [facility], [place] [address], [place], [place] [address]    Report Status PENDING  Incomplete    Studies/Results: No results found.  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 Gloriann Loan  Left ureteral stent-he will follow-up with Dr. Gloriann Loan for ureteral stent removal and discussed would be a good idea to remove the stent while he is on antibiotics. Discussed with family.   Again, no change in urologic plan.  I will notify Dr. Gloriann Loan of the patient's admission.   LOS: 3 days   Festus Aloe 04/22/2018, 1:48 PM

## 2018-04-22 NOTE — Progress Notes (Signed)
PHARMACY - PHYSICIAN COMMUNICATION CRITICAL VALUE ALERT - BLOOD CULTURE IDENTIFICATION (BCID)  George Lewis is an 62 y.o. male who presented to [facility] on 04/19/2018 with a chief complaint of fever, chills.   Assessment:  Ureteral stent? (include suspected source if known)  1 of 4 bottle + enter0coccus species Name of physician (or Provider) Contacted:  Jeannette Corpus, NP  Current antibiotics: Cefepime and Vancomycin  Changes to prescribed antibiotics recommended:  Patient is on recommended antibiotics - No changes needed  Results for orders placed or performed during the hospital encounter of 04/19/18  Blood Culture ID Panel (Reflexed) (Collected: 04/19/2018  3:25 PM)  Result Value Ref Range   Enterococcus species DETECTED (A) NOT DETECTED   Vancomycin resistance NOT DETECTED NOT DETECTED   Listeria monocytogenes NOT DETECTED NOT DETECTED   Staphylococcus species NOT DETECTED NOT DETECTED   Staphylococcus aureus NOT DETECTED NOT DETECTED   Streptococcus species NOT DETECTED NOT DETECTED   Streptococcus agalactiae NOT DETECTED NOT DETECTED   Streptococcus pneumoniae NOT DETECTED NOT DETECTED   Streptococcus pyogenes NOT DETECTED NOT DETECTED   Acinetobacter baumannii NOT DETECTED NOT DETECTED   Enterobacteriaceae species NOT DETECTED NOT DETECTED   Enterobacter cloacae complex NOT DETECTED NOT DETECTED   Escherichia coli NOT DETECTED NOT DETECTED   Klebsiella oxytoca NOT DETECTED NOT DETECTED   Klebsiella pneumoniae NOT DETECTED NOT DETECTED   Proteus species NOT DETECTED NOT DETECTED   Serratia marcescens NOT DETECTED NOT DETECTED   Haemophilus influenzae NOT DETECTED NOT DETECTED   Neisseria meningitidis NOT DETECTED NOT DETECTED   Pseudomonas aeruginosa NOT DETECTED NOT DETECTED   Candida albicans NOT DETECTED NOT DETECTED   Candida glabrata NOT DETECTED NOT DETECTED   Candida krusei NOT DETECTED NOT DETECTED   Candida parapsilosis NOT DETECTED NOT DETECTED   Candida  tropicalis NOT DETECTED NOT DETECTED    Dorrene German 04/22/2018  5:20 AM

## 2018-04-22 NOTE — Progress Notes (Signed)
Pt states that he will self administer CPAP when ready for bed.  RT to monitor and assess as needed.  

## 2018-04-22 NOTE — Progress Notes (Signed)
PROGRESS NOTE    George Lewis  EPP:[id number] DOB: 1955/10/24 DOA: 04/19/2018 PCP: Haydee Monica, [facility]    Brief Narrative:  Patient is a 62 year old gentleman history of recently diagnosed renal cell carcinoma, history of pancreatic lesion awaiting EUS scheduled for 04/25/2018, history of gross hematuria status post cystoscopy and left retrograde pyelogram left ureteroscopy with left ureteral stent placed 04/16/2018 per urology presented to the ED with a 1 to 2-day history of bilateral flank pain, lower abdominal pain, dysuria, fevers as high as 103, chills, generalized weakness and lightheadedness.  Patient admitted for sepsis work-up and pancultured.  Patient placed empirically on IV vancomycin and IV cefepime.  Urology consulted.   Assessment & Plan:   Principal Problem:   Sepsis ([place]) Active Problems:   CKD (chronic kidney disease)   Dehydration   UTI (urinary tract infection): Probable   Leukocytosis   Anemia   Renal cell carcinoma, right (HCC)   S/P ureteral stent placement: 04/16/2018   GERD (gastroesophageal reflux disease)   OSA on CPAP   HTN (hypertension)  1 sepsis likely secondary to UTI, concern for bacteremia secondary to recent urethral instrumentation. Patient presented with lower abdominal pain, dysuria, with a temperature as high as 102.8.  Patient noted to be tachycardic with heart rate as high as 135 on admission.  CBC done with a white count of 16.4 with a left shift.  Creatinine stable at 2.66. Urinalysis done with large leukocytes, negative nitrite, greater than 50 RBCs, greater than 50 WBCs, budding yeast present.  Blood cultures have been drawn and 1/2 positive for enterococcus species.  Urine cultures with multiple species.  Patient with fever curve trending down.  Patient improving clinically.  Procalcitonin level at 0.97.  May need stent to be removed in light of septic picture however patient per urology for stent removal next week.  Continue  empiric IV cefepime and IV vancomycin.  Due to continued fevers despite broad-spectrum antibiotics, ID was consulted and are following. Urology following.    2.  History of kidney stones status post recent urethral stent placement 04/16/2018 Patient presented with sepsis with recent instrumentation concerning for possible bacteremia.. Concern for UTI as urinalysis consistent with a UTI. Patient has been pancultured.  Urine cultures with multiple species however patient with recent instrumentation and as such we will continue empiric IV antibiotics.  1/2 Blood cultures with enterococcus species.  Sensitivities pending. Continue empiric IV cefepime and IV vancomycin.  Urology consulted and are following.  ID consulted and are following.  3.  Gastroesophageal reflux disease Continue PPI.  4.  Dehydration Improved with hydration.  IV fluids have been saline locked.  5.  Probable Chronic kidney disease IV Stable.  Unknown baseline.  Patient maintained on home regimen of Flomax.  Renal function seemed to have stabilized.  IV fluids have been saline locked. Monitor renal function while on IV vancomycin.  Follow.  6.  Obstructive sleep apnea CPAP nightly.  7.  Hypertension Blood pressure improved on Norvasc 5 mg daily.  Hydralazine as needed.    8.  Pancreatic lesion Patient is to have an EUS for further evaluation per gastroenterology on 04/25/2018.  Gastroenterology informed of patient's admission.  Outpatient follow-up.   9.  Right renal cell carcinoma Recently diagnosed.    Follow-up with urology in the outpatient setting.      DVT prophylaxis: SCDs Code Status: Full Family Communication: Updated patient.  No family at bedside.   Disposition Plan: Home when clinically stable, clinical improvement and  on oral antibiotics.     Consultants:   Urology: Dr. Karsten Ro  ID: Dr.Comer  Procedures:   CT abdomen and pelvis 04/19/2018  Chest x-ray 04/19/2018    Antimicrobials:    IV vancomycin 04/19/2018  IV cefepime 04/19/2018   Subjective: Patient sitting up in chair.  Feeling better.  Denies any chest pain or shortness of breath.  Good urine output.  Fever curve trending down.  Laying in bed.  Patient noted to have a fever of 101.4 last night.    Objective: Vitals:   04/21/18 2107 04/21/18 2109 04/22/18 0235 04/22/18 0510  BP:  136/78 132/74 127/77  Pulse:  93 92 88  Resp: 18 18 18 18   Temp:  99 F (37.2 C) 98.7 F (37.1 C) 98.4 F (36.9 C)  TempSrc:  Oral  Oral  SpO2:  98% 97% 97%  Weight:      Height:        Intake/Output Summary (Last 24 hours) at 04/22/2018 1037 Last data filed at 04/22/2018 7124 Gross per 24 hour  Intake 480 ml  Output 4300 ml  Net -3820 ml   Filed Weights   04/19/18 1852 04/20/18 0500 04/21/18 0500  Weight: (!) 146.9 kg (!) 149.3 kg (!) 143.4 kg    Examination:  General exam: NAD Respiratory system: Lungs clear to auscultation bilaterally.  No wheezes, no crackles, no rhonchi. Normal respiratory effort.   Cardiovascular system: RRR no murmurs rubs or gallops.  No JVD.  No lower extremity edema.   Gastrointestinal system: Abdomen is soft, obese, positive bowel sounds, nontender to palpation, no rebound, no guarding.   Central nervous system: Alert and oriented. No focal neurological deficits. Extremities: Symmetric 5 x 5 power. Skin: No rashes, lesions or ulcers Psychiatry: Judgement and insight appear normal. Mood & affect appropriate.     Data Reviewed: I have personally reviewed following labs and imaging studies  CBC: Recent Labs  Lab 04/19/18 1544 04/20/18 0339 04/21/18 0337 04/22/18 0506  WBC 16.4* 17.7* 14.6* 13.4*  NEUTROABS 12.6* 13.6* 10.7* 9.7*  HGB 11.6* 10.4* 9.8* 10.4*  HCT 35.7* 32.6* 31.1* 32.4*  MCV 86.2 87.2 87.4 86.6  PLT 260 264 263 580   Basic Metabolic Panel: Recent Labs  Lab 04/16/18 1200 04/19/18 1544 04/20/18 0339 04/21/18 0337 04/22/18 0506  NA 141 136 136 139 141  K  4.6 4.6 4.9 4.5 4.2  CL 108 103 105 110 111  CO2 20* 21* 24 22 20*  GLUCOSE 113* 157* 147* 144* 117*  BUN 22* 33* 32* 41* 45*  CREATININE 2.34* 2.52* 2.53* 2.66* 2.57*  CALCIUM 8.9 8.9 8.5* 8.3* 8.9   GFR: Estimated Creatinine Clearance: 46.1 mL/min (A) (by C-G formula based on SCr of 2.57 mg/dL (H)). Liver Function Tests: Recent Labs  Lab 04/19/18 1544 04/20/18 0339  AST 17 13*  ALT 13 11  ALKPHOS 76 64  BILITOT 0.6 0.8  PROT 7.5 6.7  ALBUMIN 3.5 3.0*   No results for input(s): LIPASE, AMYLASE in the last 168 hours. No results for input(s): AMMONIA in the last 168 hours. Coagulation Profile: No results for input(s): INR, PROTIME in the last 168 hours. Cardiac Enzymes: No results for input(s): CKTOTAL, CKMB, CKMBINDEX, TROPONINI in the last 168 hours. BNP (last 3 results) No results for input(s): PROBNP in the last 8760 hours. HbA1C: No results for input(s): HGBA1C in the last 72 hours. CBG: Recent Labs  Lab 04/20/18 1234 04/20/18 2114 04/21/18 0803 04/21/18 1147 04/21/18 1724  GLUCAP 167* 130*  118* 122* 146*   Lipid Profile: No results for input(s): CHOL, HDL, LDLCALC, TRIG, CHOLHDL, LDLDIRECT in the last 72 hours. Thyroid Function Tests: No results for input(s): TSH, T4TOTAL, FREET4, T3FREE, THYROIDAB in the last 72 hours. Anemia Panel: Recent Labs    04/20/18 0339  VITAMINB12 215  FOLATE 7.5  FERRITIN 274  TIBC 260  IRON 19*   Sepsis Labs: Recent Labs  Lab 04/19/18 1535 04/19/18 2009 04/20/18 0339 04/21/18 0337  PROCALCITON  --  1.17 1.58 0.97  LATICACIDVEN 1.62  --   --   --     Recent Results (from the past 240 hour(s))  Blood Culture (routine x 2)     Status: None (Preliminary result)   Collection Time: 04/19/18  3:25 PM  Result Value Ref Range Status   Specimen Description BLOOD RIGHT HAND  Final   Special Requests   Final    BOTTLES DRAWN AEROBIC AND ANAEROBIC Blood Culture adequate volume   Culture  Setup Time   Final    GRAM  POSITIVE COCCI AEROBIC BOTTLE ONLY CRITICAL RESULT CALLED TO, READ BACK BY AND VERIFIED WITH: B.GREEN,PHARMD AT 0510 ON 04/22/18 BY G.MCADOO    Culture   Final    TOO YOUNG TO READ Performed at [facility], [place] [address], [place], [place] [address]    Report Status PENDING  Incomplete  Blood Culture ID Panel (Reflexed)     Status: Abnormal   Collection Time: 04/19/18  3:25 PM  Result Value Ref Range Status   Enterococcus species DETECTED (A) NOT DETECTED Final    Comment: CRITICAL RESULT CALLED TO, READ BACK BY AND VERIFIED WITH: B.GREEN,PHARMD AT 0510 ON 04/22/18 BY G.MCADOO    Vancomycin resistance NOT DETECTED NOT DETECTED Final   Listeria monocytogenes NOT DETECTED NOT DETECTED Final   Staphylococcus species NOT DETECTED NOT DETECTED Final   Staphylococcus aureus NOT DETECTED NOT DETECTED Final   Streptococcus species NOT DETECTED NOT DETECTED Final   Streptococcus agalactiae NOT DETECTED NOT DETECTED Final   Streptococcus pneumoniae NOT DETECTED NOT DETECTED Final   Streptococcus pyogenes NOT DETECTED NOT DETECTED Final   Acinetobacter baumannii NOT DETECTED NOT DETECTED Final   Enterobacteriaceae species NOT DETECTED NOT DETECTED Final   Enterobacter cloacae complex NOT DETECTED NOT DETECTED Final   Escherichia coli NOT DETECTED NOT DETECTED Final   Klebsiella oxytoca NOT DETECTED NOT DETECTED Final   Klebsiella pneumoniae NOT DETECTED NOT DETECTED Final   Proteus species NOT DETECTED NOT DETECTED Final   Serratia marcescens NOT DETECTED NOT DETECTED Final   Haemophilus influenzae NOT DETECTED NOT DETECTED Final   Neisseria meningitidis NOT DETECTED NOT DETECTED Final   Pseudomonas aeruginosa NOT DETECTED NOT DETECTED Final   Candida albicans NOT DETECTED NOT DETECTED Final   Candida glabrata NOT DETECTED NOT DETECTED Final   Candida krusei NOT DETECTED NOT DETECTED Final   Candida parapsilosis NOT DETECTED NOT DETECTED Final   Candida tropicalis NOT DETECTED NOT  DETECTED Final    Comment: Performed at [facility], [place]. [address] [place], [place] [address]  Urine culture     Status: Abnormal   Collection Time: 04/19/18  6:01 PM  Result Value Ref Range Status   Specimen Description   Final    URINE, CLEAN CATCH Performed at [facility] [place] [facility], [place] [address] [place], [place] [address]    Special Requests   Final    NONE Performed at [facility], [place] [address] [place],  [place] [address]    Culture MULTIPLE SPECIES PRESENT, SUGGEST RECOLLECTION (A)  Final   Report Status 04/21/2018 FINAL  Final  MRSA PCR Screening     Status: None   Collection Time: 04/19/18  7:06 PM  Result Value Ref Range Status   MRSA by PCR NEGATIVE NEGATIVE Final    Comment:        The GeneXpert MRSA Assay (FDA approved for NASAL specimens only), is one component of a comprehensive MRSA colonization surveillance program. It is not intended to diagnose MRSA infection nor to guide or monitor treatment for MRSA infections. Performed at [facility], [place] [address] [place], [place] [address]   Blood Culture (routine x 2)     Status: None (Preliminary result)   Collection Time: 04/19/18  8:09 PM  Result Value Ref Range Status   Specimen Description   Final    BLOOD RIGHT ANTECUBITAL Performed at [place] [address] [place], [place] [address]    Special Requests   Final    BOTTLES DRAWN AEROBIC AND ANAEROBIC Blood Culture adequate volume Performed at [place] [address], [place], [place] [address]    Culture   Final    NO GROWTH 1 DAY Performed at [facility], [place] [address] [place], [place] [address]    Report Status PENDING  Incomplete         Radiology Studies: No results found.      Scheduled Meds: . allopurinol  300 mg Oral QPM  . amLODipine  5 mg Oral Daily  . feeding supplement  1 Container Oral Q24H  . feeding  supplement (PRO-STAT SUGAR FREE 64)  30 mL Oral BID  . loratadine  10 mg Oral Daily  . pantoprazole  40 mg Oral Daily  . senna  1 tablet Oral BID  . sodium chloride flush  3 mL Intravenous Q12H  . tamsulosin  0.4 mg Oral QPM   Continuous Infusions: . sodium chloride 250 mL (04/22/18 0625)  . ceFEPime (MAXIPIME) IV 2 g (04/22/18 0628)  . vancomycin 1,500 mg (04/22/18 0847)     LOS: 3 days    Time spent: 40 minutes    Irine Seal, MD [facility] Pager [phone]  If 7PM-7AM, please contact night-coverage [email or web address] Password [email or web address] 04/22/2018, 10:37 AM

## 2018-04-22 NOTE — Progress Notes (Signed)
Pharmacy Antibiotic Note  George Lewis is a 62 y.o. male admitted on 04/19/2018 with sepsis.  Pharmacy has been consulted for vancomycin + cefepime dosing.  PMH recently diagnosed right renal cell carcinoma, hx pancreatic lesion awaiting EUS (planned for 8/28), hematuria s/p cystoscopy and left retrograde pyelogram with left ureteral stent placed on 04/16/18, hx CKD. Pt presenting with complaints of lower abdominal pain and dysuria and fevers. Broad spectrum antibiotics started on admission for sepsis likely 2/2 UTI, concern for bacteremia s/p recent stent placement per MD notes.  Patient has PCN allergy listed (reported a reaction of developing a rash on his back). Received a dose of cefepime in the ED this afternoon - patient tolerated dose. Spoke with RN - no signs of an allergic reaction.   Today, 04/22/18  WBC 13.4  SCr 2.57 - patient has CKD  afebrile  Plan:  Continue vancomycin 1500mg  IV q36 (AUC 437.2, Scr 2.57)  Goal AUC 400-500  Continue Cefepime 2 g IV q24h  Monitor renal function, clinical course and culture data  F/u ID recs  Check vancomycin levels once at steady state  Height: 6\' 1"  (185.4 cm) Weight: (!) 316 lb 2.2 oz (143.4 kg) IBW/kg (Calculated) : 79.9  Temp (24hrs), Avg:98.7 F (37.1 C), Min:98.4 F (36.9 C), Max:99 F (37.2 C)  Recent Labs  Lab 04/16/18 1200 04/19/18 1535 04/19/18 1544 04/20/18 0339 04/21/18 0337 04/22/18 0506  WBC  --   --  16.4* 17.7* 14.6* 13.4*  CREATININE 2.34*  --  2.52* 2.53* 2.66* 2.57*  LATICACIDVEN  --  1.62  --   --   --   --     Estimated Creatinine Clearance: 46.1 mL/min (A) (by C-G formula based on SCr of 2.57 mg/dL (H)).    Allergies  Allergen Reactions  . Penicillins Rash and Other (See Comments)    Has patient had a PCN reaction causing immediate rash, facial/tongue/throat swelling, SOB or lightheadedness with hypotension: yes Has patient had a PCN reaction causing severe rash involving mucus membranes or  skin necrosis: no Has patient had a PCN reaction that required hospitalization: no Has patient had a PCN reaction occurring within the last 10 years: no If all of the above answers are "NO", then may proceed with Cephalosporin use.     Antimicrobials this admission: cefepime 8/22 >>  vancomycin 8/22 >>   Dose adjustments this admission:  Microbiology results: 8/22 BCx: GPC 8/22 UCx: multiple species present 8/22 MRSA PCR: Negative  Thank you for allowing pharmacy to be a part of this patient's care.  Randal Goens RPh 04/22/2018, 8:27 AM Pager [phone]

## 2018-04-22 NOTE — H&P (View-Only) (Signed)
Subjective: Patient reports he is feeling better.  He was having some dysuria but that has cleared.  He has no significant flank pain.  His blood cultures are positive for enterococcus.  Objective: Vital signs in last 24 hours: Temp:  [98.4 F (36.9 C)-99 F (37.2 C)] 98.5 F (36.9 C) (08/25 1241) Pulse Rate:  [88-95] 95 (08/25 1241) Resp:  [18-21] 21 (08/25 1241) BP: (127-136)/(74-79) 131/79 (08/25 1241) SpO2:  [97 %-98 %] 98 % (08/25 1241)  Intake/Output from previous day: 08/24 0701 - 08/25 0700 In: 720 [P.O.:720] Out: 4900 [Urine:4900] Intake/Output this shift: Total I/O In: 440 [P.O.:440] Out: 500 [Urine:500]  Physical Exam:  No acute distress, sitting in chair watching TV Abdomen soft and nontender Extremity-no calf pain or swelling  Lab Results: Recent Labs    04/20/18 0339 04/21/18 0337 04/22/18 0506  HGB 10.4* 9.8* 10.4*  HCT 32.6* 31.1* 32.4*   BMET Recent Labs    04/21/18 0337 04/22/18 0506  NA 139 141  K 4.5 4.2  CL 110 111  CO2 22 20*  GLUCOSE 144* 117*  BUN 41* 45*  CREATININE 2.66* 2.57*  CALCIUM 8.3* 8.9   No results for input(s): LABPT, INR in the last 72 hours. No results for input(s): LABURIN in the last 72 hours. Results for orders placed or performed during the hospital encounter of 04/19/18  Blood Culture (routine x 2)     Status: None (Preliminary result)   Collection Time: 04/19/18  3:25 PM  Result Value Ref Range Status   Specimen Description BLOOD RIGHT HAND  Final   Special Requests   Final    BOTTLES DRAWN AEROBIC AND ANAEROBIC Blood Culture adequate volume   Culture  Setup Time   Final    GRAM POSITIVE COCCI AEROBIC BOTTLE ONLY CRITICAL RESULT CALLED TO, READ BACK BY AND VERIFIED WITH: B.GREEN,PHARMD AT 0510 ON 04/22/18 BY G.[email or web address]    Culture   Final    TOO YOUNG TO READ Performed at [facility], [place] [address], [place], [place] [address]    Report Status PENDING  Incomplete  Blood Culture ID Panel (Reflexed)      Status: Abnormal   Collection Time: 04/19/18  3:25 PM  Result Value Ref Range Status   Enterococcus species DETECTED (A) NOT DETECTED Final    Comment: CRITICAL RESULT CALLED TO, READ BACK BY AND VERIFIED WITH: B.GREEN,PHARMD AT 0510 ON 04/22/18 BY G.[email or web address]    Vancomycin resistance NOT DETECTED NOT DETECTED Final   Listeria monocytogenes NOT DETECTED NOT DETECTED Final   Staphylococcus species NOT DETECTED NOT DETECTED Final   Staphylococcus aureus NOT DETECTED NOT DETECTED Final   Streptococcus species NOT DETECTED NOT DETECTED Final   Streptococcus agalactiae NOT DETECTED NOT DETECTED Final   Streptococcus pneumoniae NOT DETECTED NOT DETECTED Final   Streptococcus pyogenes NOT DETECTED NOT DETECTED Final   Acinetobacter baumannii NOT DETECTED NOT DETECTED Final   Enterobacteriaceae species NOT DETECTED NOT DETECTED Final   Enterobacter cloacae complex NOT DETECTED NOT DETECTED Final   Escherichia coli NOT DETECTED NOT DETECTED Final   Klebsiella oxytoca NOT DETECTED NOT DETECTED Final   Klebsiella pneumoniae NOT DETECTED NOT DETECTED Final   Proteus species NOT DETECTED NOT DETECTED Final   Serratia marcescens NOT DETECTED NOT DETECTED Final   Haemophilus influenzae NOT DETECTED NOT DETECTED Final   Neisseria meningitidis NOT DETECTED NOT DETECTED Final   Pseudomonas aeruginosa NOT DETECTED NOT DETECTED Final   Candida albicans NOT DETECTED NOT DETECTED Final  Candida glabrata NOT DETECTED NOT DETECTED Final   Candida krusei NOT DETECTED NOT DETECTED Final   Candida parapsilosis NOT DETECTED NOT DETECTED Final   Candida tropicalis NOT DETECTED NOT DETECTED Final    Comment: Performed at [facility], [place] [address], [place], [place] [address]  Urine culture     Status: Abnormal   Collection Time: 04/19/18  6:01 PM  Result Value Ref Range Status   Specimen Description   Final    URINE, CLEAN CATCH Performed at [facility], [place] [address],  [place], [place] [address]    Special Requests   Final    NONE Performed at [facility], [place] [address], [place], [place] [address]    Culture MULTIPLE SPECIES PRESENT, SUGGEST RECOLLECTION (A)  Final   Report Status 04/21/2018 FINAL  Final  MRSA PCR Screening     Status: None   Collection Time: 04/19/18  7:06 PM  Result Value Ref Range Status   MRSA by PCR NEGATIVE NEGATIVE Final    Comment:        The GeneXpert MRSA Assay (FDA approved for NASAL specimens only), is one component of a comprehensive MRSA colonization surveillance program. It is not intended to diagnose MRSA infection nor to guide or monitor treatment for MRSA infections. Performed at [facility], [place] [address] [place], [place] [address]   Blood Culture (routine x 2)     Status: None (Preliminary result)   Collection Time: 04/19/18  8:09 PM  Result Value Ref Range Status   Specimen Description   Final    BLOOD RIGHT ANTECUBITAL Performed at [place] [address] [place], [place] [address]    Special Requests   Final    BOTTLES DRAWN AEROBIC AND ANAEROBIC Blood Culture adequate volume Performed at [place] [address], [place], [place] [address]    Culture   Final    NO GROWTH 1 DAY Performed at [facility], [place] [address], [place], [place] [address]    Report Status PENDING  Incomplete    Studies/Results: No results found.  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 Gloriann Loan  Left ureteral stent-he will follow-up with Dr. Gloriann Loan for ureteral stent removal and discussed would be a good idea to remove the stent while he is on antibiotics. Discussed with family.   Again, no change in urologic plan.  I will notify Dr. Gloriann Loan of the patient's admission.   LOS: 3 days   Festus Aloe 04/22/2018, 1:48 PM

## 2018-04-23 ENCOUNTER — Encounter (HOSPITAL_COMMUNITY): Payer: Self-pay

## 2018-04-23 ENCOUNTER — Telehealth: Payer: Self-pay

## 2018-04-23 DIAGNOSIS — R7881 Bacteremia: Secondary | ICD-10-CM | POA: Diagnosis present

## 2018-04-23 DIAGNOSIS — D72829 Elevated white blood cell count, unspecified: Secondary | ICD-10-CM

## 2018-04-23 DIAGNOSIS — B952 Enterococcus as the cause of diseases classified elsewhere: Secondary | ICD-10-CM | POA: Diagnosis present

## 2018-04-23 DIAGNOSIS — N184 Chronic kidney disease, stage 4 (severe): Secondary | ICD-10-CM

## 2018-04-23 LAB — CBC
HCT: 33.2 % — ABNORMAL LOW (ref 39.0–52.0)
Hemoglobin: 10.6 g/dL — ABNORMAL LOW (ref 13.0–17.0)
MCH: 27.9 pg (ref 26.0–34.0)
MCHC: 31.9 g/dL (ref 30.0–36.0)
MCV: 87.4 fL (ref 78.0–100.0)
PLATELETS: 275 10*3/uL (ref 150–400)
RBC: 3.8 MIL/uL — AB (ref 4.22–5.81)
RDW: 16.6 % — ABNORMAL HIGH (ref 11.5–15.5)
WBC: 11.9 10*3/uL — AB (ref 4.0–10.5)

## 2018-04-23 LAB — BASIC METABOLIC PANEL
Anion gap: 9 (ref 5–15)
BUN: 41 mg/dL — AB (ref 6–20)
CO2: 21 mmol/L — ABNORMAL LOW (ref 22–32)
Calcium: 8.5 mg/dL — ABNORMAL LOW (ref 8.9–10.3)
Chloride: 110 mmol/L (ref 98–111)
Creatinine, Ser: 2.36 mg/dL — ABNORMAL HIGH (ref 0.61–1.24)
GFR calc Af Amer: 33 mL/min — ABNORMAL LOW (ref >60)
GFR, EST NON AFRICAN AMERICAN: 28 mL/min — AB (ref >60)
Glucose, Bld: 128 mg/dL — ABNORMAL HIGH (ref 70–99)
Potassium: 4.1 mmol/L (ref 3.5–5.1)
SODIUM: 140 mmol/L (ref 135–145)

## 2018-04-23 MED ORDER — LINEZOLID 600 MG PO TABS: 600.0000 mg | ORAL_TABLET | Freq: Two times a day (BID) | ORAL | 0 refills | Status: AC

## 2018-04-23 MED ORDER — METOPROLOL TARTRATE 25 MG PO TABS
25.0000 mg | ORAL_TABLET | Freq: Two times a day (BID) | ORAL | Status: DC
  Administered 2018-04-23: 25 mg via ORAL
  Filled 2018-04-23: qty 1

## 2018-04-23 MED ORDER — LINEZOLID 600 MG PO TABS
600.0000 mg | ORAL_TABLET | Freq: Two times a day (BID) | ORAL | Status: DC
  Administered 2018-04-23: 600 mg via ORAL
  Filled 2018-04-23: qty 1

## 2018-04-23 MED ORDER — METOPROLOL TARTRATE 25 MG PO TABS: 25.0000 mg | ORAL_TABLET | Freq: Two times a day (BID) | ORAL | 0 refills | Status: DC

## 2018-04-23 NOTE — Discharge Summary (Signed)
Physician Discharge Summary  George Lewis KWI:[id number] DOB: 01-Apr-1956 DOA: 04/19/2018  PCP: Pc, [facility] date: 04/19/2018 Discharge date: 04/23/2018  Time spent: 65 minutes  Recommendations for Outpatient Follow-up:  1. Follow-up with Dr. Gloriann Loan this week for follow-up on renal cell carcinoma and recent urethral stent placement in the setting of sepsis/bacteremia. 2. Follow-up with Pc, [facility] in 2 weeks.  On follow-up patient will need a basic metabolic profile to follow-up on electrolytes and renal function.   Discharge Diagnoses:  Principal Problem:   Sepsis ([place]) Active Problems:   Bacteremia due to Enterococcus   CKD (chronic kidney disease)   Dehydration   UTI (urinary tract infection): Probable   Leukocytosis   Anemia   Renal cell carcinoma, right (HCC)   S/P ureteral stent placement: 04/16/2018   GERD (gastroesophageal reflux disease)   OSA on CPAP   HTN (hypertension)   Discharge Condition: Stable and improved.  Diet recommendation: heart healthy  Filed Weights   04/20/18 0500 04/21/18 0500 04/23/18 0553  Weight: (!) 149.3 kg (!) 143.4 kg (!) 141.9 kg    History of present illness:  George Lewis is a 62 y.o. male with medical history significant of recently diagnosed right renal cell carcinoma, history of pancreatic lesion awaiting EUS scheduled for 04/25/2018, history of gross hematuria status post cystoscopy and left retrograde pyelogram left ureteroscopy with left ureteral stent placed 04/16/2018 per urology, chronic kidney disease per patient, hypertension, gastroesophageal reflux disease, obstructive sleep apnea on CPAP machine presenting to the ED with a 2-day history of lower abdominal pain, dysuria.  Patient stated post urethral stent placement 1 day prior to admission he went to work however due to lower abdominal pain and some bilateral flank pain was only able to work half a day and subsequently went home.  On  the morning of admission patient was only able to go to work from 8 AM to 10 PM and came back home secondary to worsening lower abdominal pain and dysuria.  Patient noted at home to have a fever of 103, chills, weakness, lightheadedness, constipation and subsequently presented to the ED.  Patient denied any chest pain, no nausea, no shortness of breath, no diarrhea, no melena, no hematemesis, no hematochezia, no diplopia, no dysarthria, no asymmetric weakness, no further hematuria, no other focal neurological deficits.  ED Course: Patient seen in the ED noted to have a temperature as high as 102.8, tachycardic with a heart rate of 135, blood pressure of 157/68.  Comprehensive metabolic profile obtained with a glucose of 157, BUN of 33, creatinine of 2.52 otherwise within normal limits.  CBC with differential had a white count of 16.4, hemoglobin of 11.6 otherwise was within normal limits.  Urinalysis was pending.  Blood cultures are drawn and are pending.  Chest x-ray with enlargement of cardiac silhouette with no acute abnormalities.  CT abdomen and pelvis obtained with no significant change from 04/12/2018.  Right-sided retroperitoneal hemorrhage adjacent to large right renal mass noted with some associated soft tissue gas all compatible with sequelae of recent percutaneous needle biopsy.  Retroperitoneal hemorrhage with no significant change in size, no new peri-nephric fluid or inflammation to suggest pyelonephritis.  Left kidney unremarkable without CT evidence of pyelonephritis.  Left-sided nephroureteral stent well positioned with no evidence of procedural complication.  Colonic diverticulosis without diverticulitis.  Patient given a dose of IV cefepime.  [facility] hospital[facility] were called to admit the patient for further evaluation and management.   Hospital  Course:  1 sepsis likely secondary to UTI, concern for bacteremia secondary to recent urethral instrumentation. Patient presented with lower  abdominal pain, dysuria, with a temperature as high as 102.8. Patient noted to be tachycardic with heart rate as high as 135 on admission. CBC done with a white count of 16.4 with a left shift. Creatinine stable at 2.66. Urinalysis done with large leukocytes, negative nitrite, greater than 50 RBCs, greater than 50 WBCs, budding yeast present. Blood cultures have been drawn and 1/2 positive for enterococcus species.  Urine cultures with multiple species.  Patient with fever curve trended down.  Patient improving clinically.  Procalcitonin level at 0.97.  May need stent to be removed in light of septic picture however patient per urology for stent removal next week.    Patient placed empirically on IV cefepime and IV vancomycin on admission, IV fluids and supportive care.  Patient improved clinically.  ID was consulted and recommended discharge on 2 weeks of Zyvox for 1 to 2 days post removal of stent.  Patient defervesced and improved clinically will be discharged home on oral antibiotics for 2 weeks.  Outpatient follow-up with PCP and urology.    2. History of kidney stones status post recent urethral stentplacement 04/16/2018 Patient presented with sepsis with recent instrumentation concerning for possible bacteremia..Concern for UTI as urinalysis consistent with a UTI. Patient pancultured.  Urine cultures with multiple species however patient with recent instrumentation and as such patient maintained on empiric IV antibiotics. 1/2 Blood cultures with enterococcus species.  Sensitivities pending.  Patient was maintained on IV cefepime and IV vancomycin.  Urology was consulted and ID consulted and we are following.  ID recommended discharge on outpatient Zyvox for 2 weeks and post stent removal.  Outpatient follow-up.   3. Gastroesophageal reflux disease Patient maintained on a PPI.  4. Dehydration Patient noted to be dehydrated on admission.  Patient was hydrated with IV fluids and was euvolemic  by day of discharge.    5. Probable Chronic kidney disease IV Stable.  Unknown baseline.  Patient maintained on home regimen of Flomax.  Renal function stabilized.  IV fluids subsequently saline locked.  Kidney function remained stable throughout the hospitalization.  Outpatient follow-up.    6. Obstructive sleep apnea Patient maintained on CPAP nightly.  7. Hypertension Patient initially started on Norvasc 5 mg daily for better blood pressure control and subsequently changed to metoprolol twice daily.  Blood pressure improved on metoprolol.  Patient was also maintained on hydralazine as needed.  Outpatient follow-up with PCP.    8. Pancreatic lesion Patient is to have an EUS for further evaluation per gastroenterology on 04/25/2018.  Gastroenterology informed of patient's admission.  Outpatient follow-up once patient has been treated..  9. Right renal cell carcinoma Recently diagnosed. Follow-up with urology in the outpatient setting.      Procedures:  CT abdomen and pelvis 04/19/2018  Chest x-ray 04/19/2018    Consultations:  Urology: Dr. Karsten Ro  ID: Dr.Comer  Discharge Exam: Vitals:   04/23/18 0553 04/23/18 1355  BP: 123/72 134/75  Pulse: 84 (!) 101  Resp: 18 18  Temp: 98.5 F (36.9 C) 98.9 F (37.2 C)  SpO2: 98% 98%    General: NAD Cardiovascular: RRR Respiratory: CTAB  Discharge Instructions   Discharge Instructions    Diet - low sodium heart healthy   Complete by:  As directed    Increase activity slowly   Complete by:  As directed      Allergies as of  04/23/2018      Reactions   Penicillins Rash, Other (See Comments)   Has patient had a PCN reaction causing immediate rash, facial/tongue/throat swelling, SOB or lightheadedness with hypotension: yes Has patient had a PCN reaction causing severe rash involving mucus membranes or skin necrosis: no Has patient had a PCN reaction that required hospitalization: no Has patient had a PCN  reaction occurring within the last 10 years: no If all of the above answers are "NO", then may proceed with Cephalosporin use.      Medication List    TAKE these medications   acetaminophen 500 MG tablet Commonly known as:  TYLENOL Take 500-1,000 mg by mouth every 8 (eight) hours as needed for mild pain.   allopurinol 300 MG tablet Commonly known as:  ZYLOPRIM Take 300 mg by mouth every evening.   diphenhydramine-acetaminophen 25-500 MG Tabs tablet Commonly known as:  TYLENOL PM Take 3 tablets by mouth at bedtime as needed (pain).   diphenhydramine-acetaminophen 25-500 MG Tabs tablet Commonly known as:  TYLENOL PM Take 1 tablet by mouth at bedtime as needed (sleep).   fluticasone 50 MCG/ACT nasal spray Commonly known as:  FLONASE Place 2 sprays into both nostrils daily as needed for allergies or rhinitis.   HYDROcodone-acetaminophen 5-325 MG tablet Commonly known as:  NORCO/VICODIN Take 1 tablet by mouth every 4 (four) hours as needed for moderate pain.   linezolid 600 MG tablet Commonly known as:  ZYVOX Take 1 tablet (600 mg total) by mouth every 12 (twelve) hours for 14 days.   loratadine 10 MG tablet Commonly known as:  CLARITIN Take 10 mg by mouth daily.   metoprolol tartrate 25 MG tablet Commonly known as:  LOPRESSOR Take 1 tablet (25 mg total) by mouth 2 (two) times daily.   omeprazole 20 MG tablet Commonly known as:  PRILOSEC OTC Take 40 mg by mouth daily.   Potassium Citrate 15 MEQ (1620 MG) Tbcr Take 1 tablet by mouth every evening.   tamsulosin 0.4 MG Caps capsule Commonly known as:  FLOMAX Take 0.4 mg by mouth every evening.      Allergies  Allergen Reactions  . Penicillins Rash and Other (See Comments)    Has patient had a PCN reaction causing immediate rash, facial/tongue/throat swelling, SOB or lightheadedness with hypotension: yes Has patient had a PCN reaction causing severe rash involving mucus membranes or skin necrosis: no Has patient  had a PCN reaction that required hospitalization: no Has patient had a PCN reaction occurring within the last 10 years: no If all of the above answers are "NO", then may proceed with Cephalosporin use.    Follow-up Information    Lucas Mallow, MD. Schedule an appointment as soon as possible for a visit in 3 day(s).   Specialty:  Urology Why:  f/u this week Contact information: [place] [address] [phone]        Pc, [facility]. Schedule an appointment as soon as possible for a visit in 2 week(s).   Contact information: [place] [address] [phone]            The results of significant diagnostics from this hospitalization (including imaging, microbiology, ancillary and laboratory) are listed below for reference.    Significant Diagnostic Studies: Ct Abdomen Pelvis Wo Contrast  Result Date: 04/19/2018 CLINICAL DATA:  Fever. LEFT-sided pain. LEFT-sided stent placed on Monday. Recent diagnosis of RIGHT renal cancer and possible metastasis to pancreas. Pyelonephritis,  complicated. EXAM: CT ABDOMEN AND PELVIS WITHOUT CONTRAST TECHNIQUE: Multidetector CT imaging of the abdomen and pelvis was performed following the standard protocol without IV contrast. COMPARISON:  CT abdomen dated 04/12/2018. FINDINGS: Lower chest: No acute abnormality. Hepatobiliary: No focal liver abnormality is seen. No gallstones, gallbladder wall thickening, or biliary dilatation. Pancreas: Infiltrated with fat but otherwise unremarkable. Spleen: Normal in size without focal abnormality. Adrenals/Urinary Tract: LEFT-sided nephroureteral stent appears appropriately positioned with proximal tip at the level of the LEFT renal pelvis and distal tip within the bladder. LEFT kidney otherwise unremarkable. The large RIGHT renal mass is unchanged in the short-term interval, measured on today's exam it 10 x 7 cm. Again noted is adjacent retroperitoneal  hemorrhage, not significantly changed compared to the previous study. The associated soft tissue gas within the collection is compatible with the given history of recent percutaneous needle biopsy. No new perinephric fluid. No RIGHT-sided hydronephrosis or hydroureter. Bladder is unremarkable. Stomach/Bowel: Bowel is normal in caliber. No bowel wall thickening or evidence of bowel wall inflammation seen. Diverticulosis again noted within the upper sigmoid colon and descending colon but no focal inflammatory change to suggest acute diverticulitis. Stomach is unremarkable. Vascular/Lymphatic: No abdominal aortic aneurysm. No pathologically enlarged lymph nodes identified. Reproductive: Prostate is unremarkable. Other: No new intra-abdominal or intrapelvic fluid collection. No evidence of intraperitoneal abscess. Musculoskeletal: No acute or suspicious osseous finding. IMPRESSION: 1. No significant change compared to the CT abdomen and pelvis of 04/12/2018. Again noted is RIGHT-sided retroperitoneal hemorrhage adjacent to the large RIGHT renal mass, with some associated soft tissue gas, all of which is again compatible with sequela of recent percutaneous needle biopsy. The retroperitoneal hemorrhage is not significantly changed in size or extent. No new perinephric fluid or inflammation to suggest concomitant pyelonephritis. 2. LEFT kidney is unremarkable, also without CT evidence of pyelonephritis. The LEFT-sided nephroureteral stent appears well positioned. No evidence of procedural complicating feature. 3. Colonic diverticulosis without evidence of acute diverticulitis. Electronically Signed   By: Franki Cabot M.D.   On: 04/19/2018 16:48   Dg Chest 2 View  Result Date: 03/28/2018 CLINICAL DATA:  Right-sided renal cell carcinoma. EXAM: CHEST - 2 VIEW COMPARISON:  None. FINDINGS: The heart size and mediastinal contours are within normal limits. Both lungs are clear. The visualized skeletal structures are  unremarkable. IMPRESSION: No active cardiopulmonary disease. Electronically Signed   By: Earle Gell M.D.   On: 03/28/2018 10:30   Dg Chest Port 1 View  Result Date: 04/19/2018 CLINICAL DATA:  Fever today, recent LEFT ureteral stent placement EXAM: PORTABLE CHEST 1 VIEW COMPARISON:  Portable exam 1524 hours compared to 03/28/2018 FINDINGS: Enlargement of cardiac silhouette. Mediastinal contours and pulmonary vascularity normal. Low lung volumes. No gross infiltrate, pleural effusion or pneumothorax. Osseous structures unremarkable. IMPRESSION: Enlargement of cardiac silhouette. No acute abnormalities. Electronically Signed   By: Lavonia Dana M.D.   On: 04/19/2018 15:49   Dg C-arm 1-60 Min-no Report  Result Date: 04/16/2018 Fluoroscopy was utilized by the requesting physician.  No radiographic interpretation.   US Biopsy (kidney)  Result Date: 04/09/2018 INDICATION: 62 year old male with a history of right renal mass EXAM: IMAGE GUIDED BIOPSY RIGHT renal mass MEDICATIONS: None. ANESTHESIA/SEDATION: Moderate (conscious) sedation was employed during this procedure. A total of Versed 2.0 mg and Fentanyl 100 mcg was administered intravenously. Moderate Sedation Time: 22 minutes. The patient's level of consciousness and vital signs were monitored continuously by radiology nursing throughout the procedure under my direct supervision. FLUOROSCOPY TIME:  None COMPLICATIONS:  None PROCEDURE: Informed written consent was obtained from the patient after a thorough discussion of the procedural risks, benefits and alternatives. All questions were addressed. Maximal Sterile Barrier Technique was utilized including caps, mask, sterile gowns, sterile gloves, sterile drape, hand hygiene and skin antiseptic. A timeout was performed prior to the initiation of the procedure. Patient was positioned prone position on the gantry table. Images were stored sent to PACs. Once the patient is prepped and draped in the usual sterile  fashion, the skin and subcutaneous tissues overlying the right kidney were generously infiltrated 1% lidocaine for local anesthesia. Using ultrasound guidance, a 15 gauge guide needle was advanced into the lower pole lesion of the right kidney. Once we confirmed location of the needle tip, 3 separate 18 gauge core biopsy were achieved. Two Gel-Foam pledgets were infused with a small amount of saline, then a Gel-Foam slurry was infused. The needle was removed. Final images were stored. The patient tolerated the procedure well and remained hemodynamically stable throughout. No complications were encountered and no significant blood loss encountered. IMPRESSION: Status post ultrasound-guided right renal mass biopsy. Tissue specimen sent to pathology for complete histopathologic analysis. Signed, Dulcy Fanny. Dellia Nims, RPVI [facility] Radiology Electronically Signed   By: Corrie Mckusick D.O.   On: 04/09/2018 11:21    Microbiology: Recent Results (from the past 240 hour(s))  Blood Culture (routine x 2)     Status: Abnormal (Preliminary result)   Collection Time: 04/19/18  3:25 PM  Result Value Ref Range Status   Specimen Description BLOOD RIGHT HAND  Final   Special Requests   Final    BOTTLES DRAWN AEROBIC AND ANAEROBIC Blood Culture adequate volume   Culture  Setup Time   Final    GRAM POSITIVE COCCI AEROBIC BOTTLE ONLY CRITICAL RESULT CALLED TO, READ BACK BY AND VERIFIED WITH: B.GREEN,PHARMD AT 0510 ON 04/22/18 BY G.MCADOO Performed at [facility], [place] [address], [place], [place] [address]    Culture ENTEROCOCCUS FAECALIS (A)  Final   Report Status PENDING  Incomplete  Blood Culture ID Panel (Reflexed)     Status: Abnormal   Collection Time: 04/19/18  3:25 PM  Result Value Ref Range Status   Enterococcus species DETECTED (A) NOT DETECTED Final    Comment: CRITICAL RESULT CALLED TO, READ BACK BY AND VERIFIED WITH: B.GREEN,PHARMD AT 0510 ON  04/22/18 BY G.MCADOO    Vancomycin resistance NOT DETECTED NOT DETECTED Final   Listeria monocytogenes NOT DETECTED NOT DETECTED Final   Staphylococcus species NOT DETECTED NOT DETECTED Final   Staphylococcus aureus NOT DETECTED NOT DETECTED Final   Streptococcus species NOT DETECTED NOT DETECTED Final   Streptococcus agalactiae NOT DETECTED NOT DETECTED Final   Streptococcus pneumoniae NOT DETECTED NOT DETECTED Final   Streptococcus pyogenes NOT DETECTED NOT DETECTED Final   Acinetobacter baumannii NOT DETECTED NOT DETECTED Final   Enterobacteriaceae species NOT DETECTED NOT DETECTED Final   Enterobacter cloacae complex NOT DETECTED NOT DETECTED Final   Escherichia coli NOT DETECTED NOT DETECTED Final   Klebsiella oxytoca NOT DETECTED NOT DETECTED Final   Klebsiella pneumoniae NOT DETECTED NOT DETECTED Final   Proteus species NOT DETECTED NOT DETECTED Final   Serratia marcescens NOT DETECTED NOT DETECTED Final   Haemophilus influenzae NOT DETECTED NOT DETECTED Final   Neisseria meningitidis NOT DETECTED NOT DETECTED Final   Pseudomonas aeruginosa NOT DETECTED NOT DETECTED Final   Candida albicans NOT DETECTED NOT DETECTED Final   Candida glabrata  NOT DETECTED NOT DETECTED Final   Candida krusei NOT DETECTED NOT DETECTED Final   Candida parapsilosis NOT DETECTED NOT DETECTED Final   Candida tropicalis NOT DETECTED NOT DETECTED Final    Comment: Performed at [facility], [place] [address], [place], [place] [address]  Urine culture     Status: Abnormal   Collection Time: 04/19/18  6:01 PM  Result Value Ref Range Status   Specimen Description   Final    URINE, CLEAN CATCH Performed at [facility], [place] [address], [place], [place] [address]    Special Requests   Final    NONE Performed at [facility], [place] [address] [place], [place] [address]    Culture MULTIPLE SPECIES PRESENT, SUGGEST RECOLLECTION (A)  Final   Report Status 04/21/2018  FINAL  Final  MRSA PCR Screening     Status: None   Collection Time: 04/19/18  7:06 PM  Result Value Ref Range Status   MRSA by PCR NEGATIVE NEGATIVE Final    Comment:        The GeneXpert MRSA Assay (FDA approved for NASAL specimens only), is one component of a comprehensive MRSA colonization surveillance program. It is not intended to diagnose MRSA infection nor to guide or monitor treatment for MRSA infections. Performed at [facility], [place] [address], [place], [place] [address]   Blood Culture (routine x 2)     Status: None (Preliminary result)   Collection Time: 04/19/18  8:09 PM  Result Value Ref Range Status   Specimen Description   Final    BLOOD RIGHT ANTECUBITAL Performed at [place] [address], [place], [place] [address]    Special Requests   Final    BOTTLES DRAWN AEROBIC AND ANAEROBIC Blood Culture adequate volume Performed at [place] [address] [place], [place] [address]    Culture   Final    NO GROWTH 3 DAYS Performed at [facility], [place] [address] [place], [place] [address]    Report Status PENDING  Incomplete  Culture, blood (routine x 2)     Status: None (Preliminary result)   Collection Time: 04/22/18  1:05 PM  Result Value Ref Range Status   Specimen Description BLOOD RIGHT HAND  Final   Special Requests   Final    BOTTLES DRAWN AEROBIC AND ANAEROBIC Blood Culture adequate volume   Culture   Final    NO GROWTH < 24 HOURS Performed at [facility], [place] [address], [place], [place] [address]    Report Status PENDING  Incomplete  Culture, blood (routine x 2)     Status: None (Preliminary result)   Collection Time: 04/22/18  1:07 PM  Result Value Ref Range Status   Specimen Description BLOOD RIGHT ARM  Final   Special Requests   Final    BOTTLES DRAWN AEROBIC ONLY Blood Culture adequate volume   Culture   Final    NO GROWTH < 24 HOURS Performed at [facility], [place] [address], [place], [place] [address]    Report Status PENDING  Incomplete     Labs: Basic Metabolic Panel: Recent Labs  Lab 04/19/18 1544 04/20/18 0339 04/21/18 0337 04/22/18 0506 04/23/18 0507  NA 136 136 139 141 140  K 4.6 4.9 4.5 4.2 4.1  CL 103 105 110 111 110  CO2 21* 24 22 20* 21*  GLUCOSE 157* 147* 144* 117* 128*  BUN 33* 32* 41* 45* 41*  CREATININE 2.52* 2.53* 2.66* 2.57* 2.36*  CALCIUM 8.9 8.5* 8.3* 8.9 8.5*   Liver Function Tests: Recent Labs  Lab 04/19/18 1544 04/20/18 0339  AST 17 13*  ALT 13 11  ALKPHOS 76 64  BILITOT 0.6 0.8  PROT 7.5 6.7  ALBUMIN 3.5 3.0*   No results for input(s): LIPASE, AMYLASE in the last 168 hours. No results for input(s): AMMONIA in the last 168 hours. CBC: Recent Labs  Lab 04/19/18 1544 04/20/18 0339 04/21/18 0337 04/22/18 0506 04/23/18 0507  WBC 16.4* 17.7* 14.6* 13.4* 11.9*  NEUTROABS 12.6* 13.6* 10.7* 9.7*  --   HGB 11.6* 10.4* 9.8* 10.4* 10.6*  HCT 35.7* 32.6* 31.1* 32.4* 33.2*  MCV 86.2 87.2 87.4 86.6 87.4  PLT 260 264 263 292 275   Cardiac Enzymes: No results for input(s): CKTOTAL, CKMB, CKMBINDEX, TROPONINI in the last 168 hours. BNP: BNP (last 3 results) No results for input(s): BNP in the last 8760 hours.  ProBNP (last 3 results) No results for input(s): PROBNP in the last 8760 hours.  CBG: Recent Labs  Lab 04/20/18 1234 04/20/18 2114 04/21/18 0803 04/21/18 1147 04/21/18 1724  GLUCAP 167* 130* 118* 122* 146*       Signed:  Irine Seal MD.  [facility] 04/23/2018, 3:34 PM

## 2018-04-23 NOTE — Care Management Note (Signed)
Case Management Note  Patient Details  Name: George Lewis MRN: [id number] Date of Birth: 1956-01-29  Subjective/Objective:  Patient benefit checked for Linezolid-co pay $15-MD notified.                  Action/Plan:   Expected Discharge Date:  (unknown)               Expected Discharge Plan:  Home/Self Care  In-House Referral:     Discharge planning Services  CM Consult  Post Acute Care Choice:    Choice offered to:     DME Arranged:    DME Agency:     HH Arranged:    HH Agency:     Status of Service:  In process, will continue to follow  If discussed at Long Length of Stay Meetings, dates discussed:    Additional Comments:  Dessa Phi, RN 04/23/2018, 1:06 PM

## 2018-04-23 NOTE — Progress Notes (Signed)
CM referral for benefit check for Linezolid 600mg  po bid-await insurance benefit check.

## 2018-04-23 NOTE — Telephone Encounter (Signed)
-----   Message from Milus Banister, MD sent at 04/23/2018  7:35 AM EDT ----- Regarding: RE: Question for you I agree that postponing his EUS until his infectious issues are settled is in his best interest.  I would prefer at least 3-4 days of no fevers AFTER he's completed antibiotics which I expect will probably put him off till next week at least.    I can shuffle some pts around and make room for him on Thursday Sept 5th.  Manali Mcelmurry, Can you offer 1 of my Thursday Sept 5th MAC cases a sooner appt to make room for Mr. George Lewis EUS radial +/- linear for pancreatic mass.  Please offer that cancelled Sept 5th patient a procedure THIS Thursday after my currently scheduled ERCP.  Can you also plan to contact [name] on Monday the 2nd and ask how he is doing, when he stopped abx, any fevers recently.   Thank you all  dj  ----- Message ----- From: Irving Copas., MD Sent: 04/22/2018  12:03 AM EDT To: Milus Banister, MD Subject: Question for you                               Linna Hoff, I hope you are well. This patient was admitted post ureteral stent with persistent fevers. He has this small pancreatic lesion in the NOP that needs EUS evaluation before he gets next steps in Chemo vs Surgery for his likely RCC. He is still spiking to 101.5. His EUS is scheduled for Wednesday. I didn't want to rock the boat but think maybe we should consider rescheduling his EUS for at least a week to ensure things settle down. Thoughts? Did you have availablility if we were to reschedule his EUS, in the next 1-week because I'm not sure I am at [facility]. If you think it is OK, I'll proceed, but I usually don't like patients to have recent infection when chance of FNB of a pancreatic lesion. Appreciate your thoughts and potential availability.  Chester Holstein

## 2018-04-23 NOTE — Telephone Encounter (Signed)
I have placed a call and left messages for  2 pt's on for 9/5 to get them moved so this pt can be added to 9/5.

## 2018-04-23 NOTE — Care Management Note (Signed)
Case Management Note  Patient Details  Name: George Lewis MRN: [id number] Date of Birth: [birth year]  Subjective/Objective:Admitted w/Sepsis. From home. Has pcp(makes own appt),has pharmacy.PT-no f/u.                    Action/Plan:d/c home.   Expected Discharge Date:  (unknown)               Expected Discharge Plan:  Home/Self Care  In-House Referral:     Discharge planning Services  CM Consult  Post Acute Care Choice:    Choice offered to:     DME Arranged:    DME Agency:     HH Arranged:    HH Agency:     Status of Service:  In process, will continue to follow  If discussed at Long Length of Stay Meetings, dates discussed:    Additional Comments:  Dessa Phi, RN 04/23/2018, 11:12 AM

## 2018-04-24 LAB — CULTURE, BLOOD (ROUTINE X 2): SPECIAL REQUESTS: ADEQUATE

## 2018-04-24 NOTE — Telephone Encounter (Signed)
The pt appt has been changed to 05/03/18 at 830 am Dr Ardis Hughs [facility] endo.  The pt was advised and re instructed.  He will call with any questions or concerns

## 2018-04-25 LAB — CULTURE, BLOOD (ROUTINE X 2)
Culture: NO GROWTH
Special Requests: ADEQUATE

## 2018-04-26 ENCOUNTER — Other Ambulatory Visit: Payer: Self-pay | Admitting: Urology

## 2018-04-27 ENCOUNTER — Other Ambulatory Visit: Payer: Self-pay | Admitting: Urology

## 2018-04-27 ENCOUNTER — Encounter (HOSPITAL_COMMUNITY): Payer: Self-pay

## 2018-04-27 LAB — CULTURE, BLOOD (ROUTINE X 2)
Culture: NO GROWTH
Culture: NO GROWTH
SPECIAL REQUESTS: ADEQUATE
SPECIAL REQUESTS: ADEQUATE

## 2018-04-27 NOTE — Pre-Procedure Instructions (Signed)
The following are in epic: EKG 04/20/2018

## 2018-04-27 NOTE — Patient Instructions (Addendum)
Your procedure is scheduled on: Sept. 18, 2019   Surgery Time:  8:30AM-11:30AM   Report to [facility]  Entrance    Report to admitting at 6:30 AM   Call this number if you have problems the morning of surgery [phone]   Bring CPAP mask and tubing day of surgery   Do not eat food or drink liquids :After Midnight.   Do NOT smoke 24 hours prior to day of surgery and after Midnight day of surgery   Take these medicines the morning of surgery with A SIP OF WATER:  Metoprolol, Flonase if needed                               You may not have any metal on your body including jewelry, and body piercings             Do not wear lotions, powders, perfumes/cologne, or deodorant                          Men may shave face and neck.   Do not bring valuables to the hospital. [place].   Contacts, dentures or bridgework may not be worn into surgery.   Leave suitcase in the car. After surgery it may be brought to your room.   Special Instructions: N/A              Please read over the following fact sheets you were given:  [facility] - Preparing for Surgery Before surgery, you can play an important role.  Because skin is not sterile, your skin needs to be as free of germs as possible.  You can reduce the number of germs on your skin by washing with CHG (chlorahexidine gluconate) soap before surgery.  CHG is an antiseptic cleaner which kills germs and bonds with the skin to continue killing germs even after washing. Please DO NOT use if you have an allergy to CHG or antibacterial soaps.  If your skin becomes reddened/irritated stop using the CHG and inform your nurse when you arrive at Short Stay. Do not shave (including legs and underarms) for at least 48 hours prior to the first CHG shower.  You may shave your face/neck.  Please follow these instructions carefully:  1.  Shower with CHG Soap the night before surgery  and the  morning of surgery.  2.  If you choose to wash your hair, wash your hair first as usual with your normal  shampoo.  3.  After you shampoo, rinse your hair and body thoroughly to remove the shampoo.                             4.  Use CHG as you would any other liquid soap.  You can apply chg directly to the skin and wash.                    Gently with a scrungie or clean washcloth.  5.  Apply the CHG Soap to your body ONLY FROM THE NECK DOWN.   Do not use on face/ open  Wound or open sores. Avoid contact with eyes, ears mouth and genitals (private parts).                       Wash face,  Genitals (private parts) with your normal soap.             6.  Wash thoroughly, paying special attention to the area where your surgery  will be performed.  7.  Thoroughly rinse your body with warm water from the neck down.  8.  DO NOT shower/wash with your normal soap after using and rinsing off the CHG Soap.             9.  Pat yourself dry with a clean towel.            10.  Wear clean pajamas.            11.  Place clean sheets on your bed the night of your first shower and do not  sleep with pets. Day of Surgery : Do not apply any lotions/deodorants the morning of surgery.  Please wear clean clothes to the hospital/surgery center.  FAILURE TO FOLLOW THESE INSTRUCTIONS MAY RESULT IN THE CANCELLATION OF YOUR SURGERY  PATIENT SIGNATURE_________________________________  NURSE SIGNATURE__________________________________  ________________________________________________________________________  WHAT IS A BLOOD TRANSFUSION? Blood Transfusion Information  A transfusion is the replacement of blood or some of its parts. Blood is made up of multiple cells which provide different functions.  Red blood cells carry oxygen and are used for blood loss replacement.  White blood cells fight against infection.  Platelets control bleeding.  Plasma helps clot blood.  Other  blood products are available for specialized needs, such as hemophilia or other clotting disorders. BEFORE THE TRANSFUSION  Who gives blood for transfusions?   Healthy volunteers who are fully evaluated to make sure their blood is safe. This is blood bank blood. Transfusion therapy is the safest it has ever been in the practice of medicine. Before blood is taken from a donor, a complete history is taken to make sure that person has no history of diseases nor engages in risky social behavior (examples are intravenous drug use or sexual activity with multiple partners). The donor's travel history is screened to minimize risk of transmitting infections, such as malaria. The donated blood is tested for signs of infectious diseases, such as HIV and hepatitis. The blood is then tested to be sure it is compatible with you in order to minimize the chance of a transfusion reaction. If you or a relative donates blood, this is often done in anticipation of surgery and is not appropriate for emergency situations. It takes many days to process the donated blood. RISKS AND COMPLICATIONS Although transfusion therapy is very safe and saves many lives, the main dangers of transfusion include:   Getting an infectious disease.  Developing a transfusion reaction. This is an allergic reaction to something in the blood you were given. Every precaution is taken to prevent this. The decision to have a blood transfusion has been considered carefully by your caregiver before blood is given. Blood is not given unless the benefits outweigh the risks. AFTER THE TRANSFUSION  Right after receiving a blood transfusion, you will usually feel much better and more energetic. This is especially true if your red blood cells have gotten low (anemic). The transfusion raises the level of the red blood cells which carry oxygen, and this usually causes an energy increase.  The nurse administering the  transfusion will monitor you carefully  for complications. HOME CARE INSTRUCTIONS  No special instructions are needed after a transfusion. You may find your energy is better. Speak with your caregiver about any limitations on activity for underlying diseases you may have. SEEK MEDICAL CARE IF:   Your condition is not improving after your transfusion.  You develop redness or irritation at the intravenous (IV) site. SEEK IMMEDIATE MEDICAL CARE IF:  Any of the following symptoms occur over the next 12 hours:  Shaking chills.  You have a temperature by mouth above 102 F (38.9 C), not controlled by medicine.  Chest, back, or muscle pain.  People around you feel you are not acting correctly or are confused.  Shortness of breath or difficulty breathing.  Dizziness and fainting.  You get a rash or develop hives.  You have a decrease in urine output.  Your urine turns a dark color or changes to pink, red, or brown. Any of the following symptoms occur over the next 10 days:  You have a temperature by mouth above 102 F (38.9 C), not controlled by medicine.  Shortness of breath.  Weakness after normal activity.  The white part of the eye turns yellow (jaundice).  You have a decrease in the amount of urine or are urinating less often.  Your urine turns a dark color or changes to pink, red, or brown. Document Released: 08/12/2000 Document Revised: 11/07/2011 Document Reviewed: 03/31/2008 ExitCare Patient Information 2014 [place]            __________________________________________ WHAT IS A BLOOD TRANSFUSION? Blood Transfusion Information  A transfusion is the replacement of blood or some of its parts. Blood is made up of multiple cells which provide different functions.  Red blood cells carry oxygen and are used for blood loss replacement.  White blood cells fight against infection.  Platelets control bleeding.  Plasma helps clot blood.  Other blood products are available for specialized needs, such as  hemophilia or other clotting disorders. BEFORE THE TRANSFUSION  Who gives blood for transfusions?   Healthy volunteers who are fully evaluated to make sure their blood is safe. This is blood bank blood. Transfusion therapy is the safest it has ever been in the practice of medicine. Before blood is taken from a donor, a complete history is taken to make sure that person has no history of diseases nor engages in risky social behavior (examples are intravenous drug use or sexual activity with multiple partners). The donor's travel history is screened to minimize risk of transmitting infections, such as malaria. The donated blood is tested for signs of infectious diseases, such as HIV and hepatitis. The blood is then tested to be sure it is compatible with you in order to minimize the chance of a transfusion reaction. If you or a relative donates blood, this is often done in anticipation of surgery and is not appropriate for emergency situations. It takes many days to process the donated blood. RISKS AND COMPLICATIONS Although transfusion therapy is very safe and saves many lives, the main dangers of transfusion include:   Getting an infectious disease.  Developing a transfusion reaction. This is an allergic reaction to something in the blood you were given. Every precaution is taken to prevent this. The decision to have a blood transfusion has been considered carefully by your caregiver before blood is given. Blood is not given unless the benefits outweigh the risks. AFTER THE TRANSFUSION  Right after receiving a blood transfusion, you will usually feel  much better and more energetic. This is especially true if your red blood cells have gotten low (anemic). The transfusion raises the level of the red blood cells which carry oxygen, and this usually causes an energy increase.  The nurse administering the transfusion will monitor you carefully for complications. HOME CARE INSTRUCTIONS  No special  instructions are needed after a transfusion. You may find your energy is better. Speak with your caregiver about any limitations on activity for underlying diseases you may have. SEEK MEDICAL CARE IF:   Your condition is not improving after your transfusion.  You develop redness or irritation at the intravenous (IV) site. SEEK IMMEDIATE MEDICAL CARE IF:  Any of the following symptoms occur over the next 12 hours:  Shaking chills.  You have a temperature by mouth above 102 F (38.9 C), not controlled by medicine.  Chest, back, or muscle pain.  People around you feel you are not acting correctly or are confused.  Shortness of breath or difficulty breathing.  Dizziness and fainting.  You get a rash or develop hives.  You have a decrease in urine output.  Your urine turns a dark color or changes to pink, red, or brown. Any of the following symptoms occur over the next 10 days:  You have a temperature by mouth above 102 F (38.9 C), not controlled by medicine.  Shortness of breath.  Weakness after normal activity.  The white part of the eye turns yellow (jaundice).  You have a decrease in the amount of urine or are urinating less often.  Your urine turns a dark color or changes to pink, red, or brown. Document Released: 08/12/2000 Document Revised: 11/07/2011 Document Reviewed: 03/31/2008 [facility] Patient Information 2014 [place], [place].  _______________________________________________________________________

## 2018-05-01 ENCOUNTER — Ambulatory Visit (INDEPENDENT_AMBULATORY_CARE_PROVIDER_SITE_OTHER): Payer: PRIVATE HEALTH INSURANCE | Admitting: Vascular Surgery

## 2018-05-01 ENCOUNTER — Encounter (HOSPITAL_COMMUNITY): Payer: Self-pay

## 2018-05-01 ENCOUNTER — Encounter (HOSPITAL_COMMUNITY)
Admission: RE | Admit: 2018-05-01 | Discharge: 2018-05-01 | Disposition: A | Discharge status: Home / Self Care | Payer: PRIVATE HEALTH INSURANCE | Source: Ambulatory Visit | Attending: Urology | Admitting: Urology

## 2018-05-01 ENCOUNTER — Other Ambulatory Visit: Payer: Self-pay

## 2018-05-01 ENCOUNTER — Ambulatory Visit (INDEPENDENT_AMBULATORY_CARE_PROVIDER_SITE_OTHER)
Admission: RE | Admit: 2018-05-01 | Discharge: 2018-05-01 | Disposition: A | Discharge status: Home / Self Care | Payer: PRIVATE HEALTH INSURANCE | Source: Ambulatory Visit | Attending: Vascular Surgery | Admitting: Vascular Surgery

## 2018-05-01 ENCOUNTER — Encounter: Payer: Self-pay | Admitting: Vascular Surgery

## 2018-05-01 ENCOUNTER — Encounter: Payer: Self-pay | Admitting: *Deleted

## 2018-05-01 ENCOUNTER — Ambulatory Visit (HOSPITAL_COMMUNITY)
Admission: RE | Admit: 2018-05-01 | Discharge: 2018-05-01 | Disposition: A | Discharge status: Home / Self Care | Payer: PRIVATE HEALTH INSURANCE | Source: Ambulatory Visit | Attending: Vascular Surgery | Admitting: Vascular Surgery

## 2018-05-01 ENCOUNTER — Other Ambulatory Visit: Payer: Self-pay | Admitting: *Deleted

## 2018-05-01 VITALS — BP 100/65 | HR 76 | Temp 97.4°F | Resp 18 | Ht 73.0 in | Wt 310.0 lb

## 2018-05-01 DIAGNOSIS — Z01818 Encounter for other preprocedural examination: Secondary | ICD-10-CM | POA: Diagnosis not present

## 2018-05-01 DIAGNOSIS — N184 Chronic kidney disease, stage 4 (severe): Secondary | ICD-10-CM

## 2018-05-01 DIAGNOSIS — N189 Chronic kidney disease, unspecified: Secondary | ICD-10-CM | POA: Insufficient documentation

## 2018-05-01 DIAGNOSIS — I129 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 kidney disease, or unspecified chronic kidney disease: Secondary | ICD-10-CM | POA: Insufficient documentation

## 2018-05-01 DIAGNOSIS — C649 Malignant neoplasm of unspecified kidney, except renal pelvis: Secondary | ICD-10-CM | POA: Insufficient documentation

## 2018-05-01 DIAGNOSIS — I868 Varicose veins of other specified sites: Secondary | ICD-10-CM | POA: Insufficient documentation

## 2018-05-01 HISTORY — DX: Solitary pulmonary nodule: R91.1

## 2018-05-01 HISTORY — DX: Personal history of pulmonary embolism: Z86.711

## 2018-05-01 HISTORY — DX: Personal history of other infectious and parasitic diseases: Z86.19

## 2018-05-01 HISTORY — DX: Fatty (change of) liver, not elsewhere classified: K76.0

## 2018-05-01 HISTORY — DX: Anemia, unspecified: D64.9

## 2018-05-01 HISTORY — DX: Diverticulosis of large intestine without perforation or abscess without bleeding: K57.30

## 2018-05-01 HISTORY — DX: Disease of pancreas, unspecified: K86.9

## 2018-05-01 HISTORY — DX: Nocturia: R35.1

## 2018-05-01 HISTORY — DX: Other specified disorders of kidney and ureter: N28.89

## 2018-05-01 HISTORY — DX: Cyst of kidney, acquired: N28.1

## 2018-05-01 HISTORY — DX: Chronic kidney disease, stage 4 (severe): N18.4

## 2018-05-01 HISTORY — DX: Calculus of gallbladder without cholecystitis without obstruction: K80.20

## 2018-05-01 LAB — BASIC METABOLIC PANEL
Anion gap: 10 (ref 5–15)
BUN: 35 mg/dL — AB (ref 6–20)
CALCIUM: 9.3 mg/dL (ref 8.9–10.3)
CO2: 23 mmol/L (ref 22–32)
CREATININE: 2.45 mg/dL — AB (ref 0.61–1.24)
Chloride: 107 mmol/L (ref 98–111)
GFR calc Af Amer: 32 mL/min — ABNORMAL LOW (ref >60)
GFR, EST NON AFRICAN AMERICAN: 27 mL/min — AB (ref >60)
Glucose, Bld: 119 mg/dL — ABNORMAL HIGH (ref 70–99)
Potassium: 5.8 mmol/L — ABNORMAL HIGH (ref 3.5–5.1)
SODIUM: 140 mmol/L (ref 135–145)

## 2018-05-01 LAB — PROTIME-INR
INR: 1.1
PROTHROMBIN TIME: 14.2 s (ref 11.4–15.2)

## 2018-05-01 LAB — CBC
HCT: 39.4 % (ref 39.0–52.0)
Hemoglobin: 12.6 g/dL — ABNORMAL LOW (ref 13.0–17.0)
MCH: 27.6 pg (ref 26.0–34.0)
MCHC: 32 g/dL (ref 30.0–36.0)
MCV: 86.4 fL (ref 78.0–100.0)
PLATELETS: 409 10*3/uL — AB (ref 150–400)
RBC: 4.56 MIL/uL (ref 4.22–5.81)
RDW: 16.2 % — AB (ref 11.5–15.5)
WBC: 14.8 10*3/uL — AB (ref 4.0–10.5)

## 2018-05-01 NOTE — Progress Notes (Signed)
CBC and BMP results dated 05-01-2018 to dr bell in epic.  EKG dated 04-20-2018 in epic.

## 2018-05-01 NOTE — Progress Notes (Signed)
Patient name: George Lewis. MRN: [id number] DOB: [birth year] Sex: male  REASON FOR CONSULT: Dialysis access creation  HPI: George Lewis. is a 62 y.o. male with RCC scheduled for right nephrectomy on 05/16/2018, pancreatic lesion currently undergoing work-up, HTN, OSA, and stage IV chronic kidney disease that presents for permanent hemodialysis access creation.  Patient was recently hospitalized with bacteremia in the ICU (suspected UTI in setting of recent ureteral stent) and per the hospitalist notes he was noted to have CKD stage IV.  Moreover given the planned nephrectomy later this month there is concern that he may have worsening baseline renal function and he presents for dialysis creation.  He denies any previous hemodialysis access.  He has never had a hemodialysis catheter before.  He is not currently on dialysis.  Patient states he is right-handed.  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  Denies being on blood thinners.  Past Medical History:  Diagnosis Date  . Anemia   . Arthritis    knees  . Bilateral renal cysts 03/23/2018   Noted MRI ABD  . Cancer [facility])    right kidney new dx tuesday 04-10-18  . Cholelithiasis 03/19/2018   noted on CT AB/Pelvis  . CKD (chronic kidney disease)   . Dehydration   . Diverticulosis of colon 04/19/2018   Noted on CT abd/pelvis  . GERD (gastroesophageal reflux disease)    hx of   . Hepatic steatosis 03/19/2018   Mild diffuse, noted on CT AB/Pelvis  . History of gross hematuria   . History of kidney stones   . Hypertension    off bp med lisinopril since june or july  . Left ureteral stone 03/19/2018   distal 73mm  . OSA on CPAP   . Pancreatic lesion    0.4cm cystic  . PE (pulmonary thromboembolism) ([place]) 03/2008  . Pulmonary nodule    Solid 65mm Right Lower Lobe  . 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 S/P ureteral stent  placement: 04/16/2018 04/19/2018  . Sepsis ([place]) 03/2018  . UTI (urinary tract infection)     Past Surgical History:  Procedure Laterality Date  . CYSTOSCOPY/RETROGRADE/URETEROSCOPY/STONE EXTRACTION WITH BASKET     x2  . CYSTOSCOPY/URETEROSCOPY/HOLMIUM LASER/STENT PLACEMENT Left 04/16/2018   Procedure: CYSTOSCOPY/LEFT URETEROSCOPY/LEFT RETROGRADE/STENT PLACEMENT;  Surgeon: Lucas Mallow, MD;  Location: [facility] ORS;  Service: Urology;  Laterality: Left;  . EXTRACORPOREAL SHOCK WAVE LITHOTRIPSY     x 3  . left index finger attachment  18-19 yrs ago   3-4 surgeries  . TOE SURGERY  12-14 yrs ago   in beween 2nd and 3rd toes cyst removed    Family History  Problem Relation Age of Onset  . Alzheimer's disease Mother   . Alzheimer's disease Father   . Heart disease Father     SOCIAL HISTORY: Social History   Socioeconomic History  . Marital status: Married    Spouse name: Not on file  . Number of children: Not on file  . Years of education: Not on file  . Highest education level: Not on file  Occupational History  . Not on file  Social Needs  . Financial resource strain: Patient refused  . Food insecurity:    Worry: Never true    Inability: Never true  . Transportation needs:    Medical: No    Non-medical: No  Tobacco Use  . Smoking status: Never Smoker  . Smokeless tobacco: Never Used  Substance and Sexual Activity  . Alcohol use: Never    Frequency: Never  . Drug use: Never  . Sexual activity: Not on file  Lifestyle  . Physical activity:    Days per week: 0 days    Minutes per session: 0 min  . Stress: Not on file  Relationships  . Social connections:    Talks on phone: More than three times a week    Gets together: Never    Attends religious service: More than 4 times per year    Active member of club or organization: No    Attends meetings of clubs or organizations: Never    Relationship status: Married  . Intimate partner violence:    Fear of current or  ex partner: Not on file    Emotionally abused: Not on file    Physically abused: Not on file    Forced sexual activity: Not on file  Other Topics Concern  . Not on file  Social History Narrative  . Not on file    Allergies  Allergen Reactions  . Penicillins Rash and Other (See Comments)    Has patient had a PCN reaction causing immediate rash, facial/tongue/throat swelling, SOB or lightheadedness with hypotension: yes Has patient had a PCN reaction causing severe rash involving mucus membranes or skin necrosis: no Has patient had a PCN reaction that required hospitalization: no Has patient had a PCN reaction occurring within the last 10 years: no If all of the above answers are "NO", then may proceed with Cephalosporin use.     Current Outpatient Medications  Medication Sig Dispense Refill  . acetaminophen (TYLENOL) 500 MG tablet Take 500-1,000 mg by mouth every 8 (eight) hours as needed for mild pain.     Marland Kitchen allopurinol (ZYLOPRIM) 300 MG tablet Take 300 mg by mouth every evening.   3  . diphenhydramine-acetaminophen (TYLENOL PM) 25-500 MG TABS tablet Take 3 tablets by mouth at bedtime as needed (pain).    Marland Kitchen diphenhydramine-acetaminophen (TYLENOL PM) 25-500 MG TABS tablet Take 1 tablet by mouth at bedtime as needed (sleep).    . fluticasone (FLONASE) 50 MCG/ACT nasal spray Place 2 sprays into both nostrils daily as needed for allergies or rhinitis.    Marland Kitchen linezolid (ZYVOX) 600 MG tablet Take 1 tablet (600 mg total) by mouth every 12 (twelve) hours for 14 days. 28 tablet 0  . loratadine (CLARITIN) 10 MG tablet Take 10 mg by mouth daily.    . metoprolol tartrate (LOPRESSOR) 25 MG tablet Take 1 tablet (25 mg total) by mouth 2 (two) times daily. 60 tablet 0  . omeprazole (PRILOSEC OTC) 20 MG tablet Take 40 mg by mouth daily.     . Potassium Citrate 15 MEQ (1620 MG) TBCR Take 1 tablet by mouth every evening.   3  . tamsulosin (FLOMAX) 0.4 MG CAPS capsule Take 0.4 mg by mouth every evening.    3  . HYDROcodone-acetaminophen (NORCO/VICODIN) 5-325 MG tablet Take 1 tablet by mouth every 4 (four) hours as needed for moderate pain. (Patient not taking: Reported on 05/01/2018) 10 tablet 0   No current facility-administered medications for this visit.     REVIEW OF SYSTEMS:  [X]  denotes positive finding, [ ]  denotes negative finding Cardiac  Comments:  Chest pain or chest pressure:    Shortness of breath upon exertion: x   Short of breath when lying flat:  Irregular heart rhythm:        Vascular    Pain in calf, thigh, or hip brought on by ambulation:    Pain in feet at night that wakes you up from your sleep:     Blood clot in your veins:    Leg swelling:         Pulmonary    Oxygen at home:    Productive cough:     Wheezing:         Neurologic    Sudden weakness in arms or legs:     Sudden numbness in arms or legs:     Sudden onset of difficulty speaking or slurred speech:    Temporary loss of vision in one eye:     Problems with dizziness:         Gastrointestinal    Blood in stool:     Vomited blood:         Genitourinary    Burning when urinating:     Blood in urine:        Psychiatric    Major depression:         Hematologic    Bleeding problems:    Problems with blood clotting too easily:        Skin    Rashes or ulcers:        Constitutional    Fatigue: x     PHYSICAL EXAM: Vitals:   05/01/18 0843  BP: 100/65  Pulse: 76  Resp: 18  Temp: (!) 97.4 F (36.3 C)  TempSrc: Oral  SpO2: 97%  Weight: (!) 140.6 kg  Height: 6\' 1"  (1.854 m)    GENERAL: The patient is a well-nourished male, in no acute distress. The vital signs are documented above. CARDIAC: There is a regular rate and rhythm.  VASCULAR:  2+ palpable radial pulse bilateral upper extremities 2+ brachial pulse palpable bilateral upper extremities No tissue loss either upper extremity Hands are both warm and motor and sensory intact. PULMONARY: There is good air exchange  bilaterally without wheezing or rales. ABDOMEN: Soft and non-tender with normal pitched bowel sounds.  MUSCULOSKELETAL: There are no major deformities or cyanosis. NEUROLOGIC: No focal weakness or paresthesias are detected. SKIN: There are no ulcers or rashes noted. PSYCHIATRIC: The patient has a normal affect.  DATA:   I have independently reviewed his upper extremity vein mapping and duplex study.  He has nice triphasic waveforms of the bilateral upper extremities.  His left cephalic vein appears adequate at the distal forearm at 3.0 mm and then empties into a nice basilic vein in the left upper arm that is over 4.0 mm.  Assessment/Plan:  62 year old male with multiple medical comorbidities including stage IV chronic kidney disease and planned right nephrectomy later this month for RCC.  We will plan for a left arm fistula since he is right-hand dominant.  His left cephalic vein at the wrist appears adequate and that empties into a nice basilic vein in the upper arm.  Discussed with patient plan for a radiocephalic fistula in the left wrist.  We discussed risks and benefits including bleeding, infection, steal, failure to mature.  Discussed that if he needs a catheter after his nephrectomy this could be placed in the hospital since he is not currently on hemodialysis and surgery scheduled for later in September.   Marty Heck, MD [facility] Office: [phone] Pager: [place]

## 2018-05-03 ENCOUNTER — Encounter (HOSPITAL_COMMUNITY)
Admission: RE | Disposition: A | Discharge status: Home / Self Care | Payer: Self-pay | Source: Ambulatory Visit | Attending: Gastroenterology

## 2018-05-03 ENCOUNTER — Other Ambulatory Visit: Payer: Self-pay

## 2018-05-03 ENCOUNTER — Ambulatory Visit (HOSPITAL_COMMUNITY)
Admission: RE | Admit: 2018-05-03 | Discharge: 2018-05-03 | Disposition: A | Discharge status: Home / Self Care | Payer: PRIVATE HEALTH INSURANCE | Source: Ambulatory Visit | Attending: Gastroenterology | Admitting: Gastroenterology

## 2018-05-03 ENCOUNTER — Ambulatory Visit (HOSPITAL_COMMUNITY): Payer: PRIVATE HEALTH INSURANCE | Admitting: Anesthesiology

## 2018-05-03 ENCOUNTER — Encounter (HOSPITAL_COMMUNITY): Payer: Self-pay | Admitting: *Deleted

## 2018-05-03 DIAGNOSIS — D360 Benign neoplasm of lymph nodes: Secondary | ICD-10-CM

## 2018-05-03 DIAGNOSIS — C641 Malignant neoplasm of right kidney, except renal pelvis: Secondary | ICD-10-CM | POA: Insufficient documentation

## 2018-05-03 DIAGNOSIS — I10 Essential (primary) hypertension: Secondary | ICD-10-CM | POA: Insufficient documentation

## 2018-05-03 DIAGNOSIS — K8689 Other specified diseases of pancreas: Secondary | ICD-10-CM | POA: Diagnosis not present

## 2018-05-03 DIAGNOSIS — K219 Gastro-esophageal reflux disease without esophagitis: Secondary | ICD-10-CM | POA: Insufficient documentation

## 2018-05-03 DIAGNOSIS — D649 Anemia, unspecified: Secondary | ICD-10-CM | POA: Insufficient documentation

## 2018-05-03 DIAGNOSIS — R935 Abnormal findings on diagnostic imaging of other abdominal regions, including retroperitoneum: Secondary | ICD-10-CM

## 2018-05-03 DIAGNOSIS — R109 Unspecified abdominal pain: Secondary | ICD-10-CM

## 2018-05-03 DIAGNOSIS — Z79899 Other long term (current) drug therapy: Secondary | ICD-10-CM | POA: Insufficient documentation

## 2018-05-03 HISTORY — PX: FINE NEEDLE ASPIRATION: SHX5430

## 2018-05-03 HISTORY — PX: EUS: SHX5427

## 2018-05-03 SURGERY — UPPER ENDOSCOPIC ULTRASOUND (EUS) RADIAL
Anesthesia: Monitor Anesthesia Care

## 2018-05-03 MED ORDER — PROPOFOL 10 MG/ML IV BOLUS
INTRAVENOUS | Status: AC
  Filled 2018-05-03: qty 20

## 2018-05-03 MED ORDER — PROPOFOL 500 MG/50ML IV EMUL
INTRAVENOUS | Status: DC | PRN
  Administered 2018-05-03: 125 ug/kg/min via INTRAVENOUS

## 2018-05-03 MED ORDER — LIDOCAINE 2% (20 MG/ML) 5 ML SYRINGE
INTRAMUSCULAR | Status: DC | PRN
  Administered 2018-05-03: 100 mg via INTRAVENOUS

## 2018-05-03 MED ORDER — PROPOFOL 10 MG/ML IV BOLUS
INTRAVENOUS | Status: DC | PRN
  Administered 2018-05-03 (×2): 20 mg via INTRAVENOUS
  Administered 2018-05-03: 10 mg via INTRAVENOUS

## 2018-05-03 MED ORDER — SODIUM CHLORIDE 0.9 % IV SOLN
INTRAVENOUS | Status: DC
  Administered 2018-05-03 (×2): via INTRAVENOUS

## 2018-05-03 MED ORDER — PROPOFOL 10 MG/ML IV BOLUS
INTRAVENOUS | Status: AC
  Filled 2018-05-03: qty 40

## 2018-05-03 NOTE — Anesthesia Postprocedure Evaluation (Signed)
Anesthesia Post Note  Patient: George Lewis.  Procedure(s) Performed: UPPER ENDOSCOPIC ULTRASOUND (EUS) RADIAL (N/A ) FINE NEEDLE ASPIRATION (FNA) LINEAR (N/A ) UPPER ENDOSCOPIC ULTRASOUND (EUS) LINEAR (N/A )     Patient location during evaluation: PACU Anesthesia Type: MAC Level of consciousness: awake and alert Pain management: pain level controlled Vital Signs Assessment: post-procedure vital signs reviewed and stable Respiratory status: spontaneous breathing Cardiovascular status: stable Anesthetic complications: no    Last Vitals:  Vitals:   05/03/18 1000 05/03/18 1015  BP: 125/68 118/61  Pulse: 82   Resp: 14 17  Temp:    SpO2: 100% 100%    Last Pain:  Vitals:   05/03/18 1015  TempSrc:   PainSc: 0-No pain                 Nolon Nations

## 2018-05-03 NOTE — Interval H&P Note (Signed)
History and Physical Interval Note:  05/03/2018 7:22 AM  George Lewis.  has presented today for surgery, with the diagnosis of abnormal pancreas, history of renal tumor  The various methods of treatment have been discussed with the patient and family. After consideration of risks, benefits and other options for treatment, the patient has consented to  Procedure(s): UPPER ENDOSCOPIC ULTRASOUND (EUS) RADIAL (N/A) as a surgical intervention .  The patient's history has been reviewed, patient examined, no change in status, stable for surgery.  I have reviewed the patient's chart and labs.  Questions were answered to the patient's satisfaction.     Milus Banister

## 2018-05-03 NOTE — Transfer of Care (Signed)
Immediate Anesthesia Transfer of Care Note  Patient: George Lewis.  Procedure(s) Performed: UPPER ENDOSCOPIC ULTRASOUND (EUS) RADIAL (N/A ) FINE NEEDLE ASPIRATION (FNA) LINEAR (N/A ) UPPER ENDOSCOPIC ULTRASOUND (EUS) LINEAR (N/A )  Patient Location: PACU  Anesthesia Type:MAC  Level of Consciousness: awake, alert  and oriented  Airway & Oxygen Therapy: Patient Spontanous Breathing and Patient connected to nasal cannula oxygen  Post-op Assessment: Report given to RN and Post -op Vital signs reviewed and stable  Post vital signs: Reviewed and stable  Last Vitals:  Vitals Value Taken Time  BP    Temp    Pulse 96 05/03/2018  9:48 AM  Resp 27 05/03/2018  9:48 AM  SpO2 98 % 05/03/2018  9:48 AM  Vitals shown include unvalidated device data.  Last Pain:  Vitals:   05/03/18 0725  TempSrc: Oral  PainSc: 0-No pain         Complications: No apparent anesthesia complications

## 2018-05-03 NOTE — Op Note (Addendum)
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 Providers:                Milus Banister, MD, Carmie End, RN,                            William Dalton, Technician Referring MD:             Link Snuffer, MD ([facility]), Stark Klein,                            MD ([facility]) Medicines:                Monitored Anesthesia Care Complications:            No immediate complications. Estimated blood loss:                            None. Estimated Blood Loss:     Estimated blood loss: none. Procedure:                Pre-Anesthesia Assessment:                           - Prior to the procedure, a History and Physical                            was performed, and patient medications and                            allergies were reviewed. The patient's tolerance of                            previous anesthesia was also reviewed. The risks                            and benefits of the procedure and the sedation                            options and risks were discussed with the patient.                            All questions were answered, and informed consent  was obtained. Prior Anticoagulants: The patient has                            taken no previous anticoagulant or antiplatelet                            agents. ASA Grade Assessment: II - A patient with                            mild systemic disease. After reviewing the risks                            and benefits, the patient was deemed in                            satisfactory condition  to undergo the procedure.                           After obtaining informed consent, the endoscope was                            passed under direct vision. Throughout the                            procedure, the patient's blood pressure, pulse, and                            oxygen saturations were monitored continuously. The                            [id number] ([id number]) Olympus Radial EUS was                            introduced through the mouth, and advanced to the                            second part of duodenum. The upper EUS was                            accomplished without difficulty. The patient                            tolerated the procedure well. Scope In: Scope Out: Findings:      ENDOSONOGRAPHIC FINDING: :      1. An irregular, hypoechoic soft tissue mass was found in or directly       abutting the medial neck of the pancreas, 2-3cm deep to the duodenal       wall. The lesion measures 67mm across and lies adjacent to the SMV. The       endosonographic borders were poorly-defined. Fine needle aspiration for       cytology was performed. Color Doppler imaging was utilized prior to       needle puncture to confirm a lack of significant vascular structures       within the needle path.  Two passes were made with the 25 gauge needle       using a transduodenal approach. A cytotechnologist was present to       evaluate the adequacy of the specimen. Final cytology results are       pending.      2. 70mm simple appearing cyst in the tail of pancreas without any       concerning morphologic features.      3. Single 12mm reactive appearing periportal lymphnode (not sampled).      4. Gallbladder was normal.      6. CBD was normal, non-dilated. Impression:               - 37mm irregular soft tissue lesion involving the                            medial neck of the pancreas, adjacent to the SMV                            and 2-3cm deep to the medial duodenum wall. The                             lesion was sampled with EUS FNA, preliminary                            cytology review showed neoplastic cells. Await                            final testing.                           - 10mm cyst in the tail of pancreas, no concerning                            morophologic features.                           - Reactive, benign appearing 1.4cm periportal lymph                            node. Moderate Sedation:      N/A- Per Anesthesia Care Recommendation:           - Discharge patient to home (ambulatory).                           - Await final cytology testing. Procedure Code(s):        --- Professional ---                           [phone], Esophagogastroduodenoscopy, flexible,                            transoral; with transendoscopic ultrasound-guided                            intramural or transmural fine needle  aspiration/biopsy(s), (includes endoscopic                            ultrasound examination limited to the esophagus,                            stomach or duodenum, and adjacent structures) Diagnosis Code(s):        --- Professional ---                           K86.89, Other specified diseases of pancreas                           R93.3, Abnormal findings on diagnostic imaging of                            other parts of digestive tract CPT copyright 2018 American [employer] Association. All rights reserved. The codes documented in this report are preliminary and upon coder review may  be revised to meet current compliance requirements. Milus Banister, MD 05/03/2018 9:45:08 AM This report has been signed electronically. Number of Addenda: 0

## 2018-05-03 NOTE — Anesthesia Preprocedure Evaluation (Signed)
Anesthesia Evaluation  Patient identified by MRN, date of birth, ID band Patient awake    Reviewed: Allergy & Precautions, NPO status , Patient's Chart, lab work & pertinent test results  Airway Mallampati: III  TM Distance: >3 FB Neck ROM: Full    Dental  (+) Teeth Intact   Pulmonary sleep apnea and Continuous Positive Airway Pressure Ventilation ,    Pulmonary exam normal breath sounds clear to auscultation       Cardiovascular hypertension, Pt. 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   Neuro/Psych negative neurological ROS  negative psych ROS   GI/Hepatic Neg liver ROS, GERD  Medicated and Controlled,  Endo/Other  Morbid obesityGout   Renal/GU Renal diseaseRight renal Ca Left ureteral calculus  negative genitourinary   Musculoskeletal  (+) Arthritis , Osteoarthritis,    Abdominal (+) + obese,   Peds  Hematology  (+) anemia ,   Anesthesia Other Findings   Reproductive/Obstetrics                             Anesthesia Physical  Anesthesia Plan  ASA: III  Anesthesia Plan: MAC   Post-op Pain Management:    Induction: Intravenous  PONV Risk Score and Plan: 1 and Ondansetron, Midazolam, Dexamethasone and Treatment may vary due to age or medical condition  Airway Management Planned: Simple Face Mask  Additional Equipment:   Intra-op Plan:   Post-operative Plan:   Informed Consent: I have reviewed the patients History and Physical, chart, labs and discussed the procedure including the risks, benefits and alternatives for the proposed anesthesia with the patient or authorized representative who has indicated his/her understanding and acceptance.   Dental advisory given  Plan Discussed with: CRNA  Anesthesia Plan Comments:         Anesthesia Quick Evaluation

## 2018-05-03 NOTE — Discharge Instructions (Signed)
YOU HAD AN ENDOSCOPIC PROCEDURE TODAY: Refer to the procedure report and other information in the discharge instructions given to you for any specific questions about what was found during the examination. If this information does not answer your questions, please call [place] office at [phone] to clarify.  ° °YOU SHOULD EXPECT: Some feelings of bloating in the abdomen. Passage of more gas than usual. Walking can help get rid of the air that was put into your GI tract during the procedure and reduce the bloating. If you had a lower endoscopy (such as a colonoscopy or flexible sigmoidoscopy) you may notice spotting of blood in your stool or on the toilet paper. Some abdominal soreness may be present for a day or two, also. ° °DIET: Your first meal following the procedure should be a light meal and then it is ok to progress to your normal diet. A half-sandwich or bowl of soup is an example of a good first meal. Heavy or fried foods are harder to digest and may make you feel nauseous or bloated. Drink plenty of fluids but you should avoid alcoholic beverages for 24 hours. If you had a esophageal dilation, please see attached instructions for diet.   ° °ACTIVITY: Your care partner should take you home directly after the procedure. You should plan to take it easy, moving slowly for the rest of the day. You can resume normal activity the day after the procedure however YOU SHOULD NOT DRIVE, use power tools, machinery or perform tasks that involve climbing or major physical exertion for 24 hours (because of the sedation medicines used during the test).  ° °SYMPTOMS TO REPORT IMMEDIATELY: °A gastroenterologist can be reached at any hour. Please call [phone]  for any of the following symptoms:  °Following lower endoscopy (colonoscopy, flexible sigmoidoscopy) °Excessive amounts of blood in the stool  °Significant tenderness, worsening of abdominal pains  °Swelling of the abdomen that is new, acute  °Fever of 100° or  higher  °Following upper endoscopy (EGD, EUS, ERCP, esophageal dilation) °Vomiting of blood or coffee ground material  °New, significant abdominal pain  °New, significant chest pain or pain under the shoulder blades  °Painful or persistently difficult swallowing  °New shortness of breath  °Black, tarry-looking or red, bloody stools ° °FOLLOW UP:  °If any biopsies were taken you will be contacted by phone or by letter within the next 1-3 weeks. Call [phone]  if you have not heard about the biopsies in 3 weeks.  °Please also call with any specific questions about appointments or follow up tests. ° °

## 2018-05-04 ENCOUNTER — Encounter (HOSPITAL_COMMUNITY): Payer: Self-pay | Admitting: *Deleted

## 2018-05-04 ENCOUNTER — Telehealth: Payer: Self-pay | Admitting: *Deleted

## 2018-05-04 ENCOUNTER — Other Ambulatory Visit: Payer: Self-pay

## 2018-05-04 MED ORDER — VANCOMYCIN HCL 10 G IV SOLR
1500.0000 mg | INTRAVENOUS | Status: AC
  Administered 2018-05-07: 1500 mg via INTRAVENOUS
  Filled 2018-05-04: qty 1500

## 2018-05-04 NOTE — Telephone Encounter (Signed)
Call and spoke with wife LINDA to be at [place] department at 5:30 am on 05/07/18 for surgery.

## 2018-05-04 NOTE — Progress Notes (Signed)
Pt denies SOB, chest pain, and being under the care of a cardiologist. Pt stated  that a stress test was performed " about 20 years ago." Pt denies having an echo and cardiac cath. Pt made aware to stop taking vitamins, fish oil and herbal medications. Do not take any NSAIDs ie: Ibuprofen, Advil, Naproxen (Aleve), Motrin, BC and Goody Powder. Pt verbalized understanding of all pre-op instructions.

## 2018-05-07 ENCOUNTER — Other Ambulatory Visit: Payer: Self-pay

## 2018-05-07 ENCOUNTER — Ambulatory Visit (HOSPITAL_COMMUNITY): Payer: PRIVATE HEALTH INSURANCE | Admitting: Physician Assistant

## 2018-05-07 ENCOUNTER — Encounter (HOSPITAL_COMMUNITY): Payer: Self-pay | Admitting: *Deleted

## 2018-05-07 ENCOUNTER — Ambulatory Visit (HOSPITAL_COMMUNITY): Payer: PRIVATE HEALTH INSURANCE | Admitting: Anesthesiology

## 2018-05-07 ENCOUNTER — Encounter (HOSPITAL_COMMUNITY)
Admission: RE | Disposition: A | Discharge status: Home / Self Care | Payer: Self-pay | Source: Ambulatory Visit | Attending: Vascular Surgery

## 2018-05-07 ENCOUNTER — Telehealth: Payer: Self-pay | Admitting: Vascular Surgery

## 2018-05-07 ENCOUNTER — Ambulatory Visit (HOSPITAL_COMMUNITY)
Admission: RE | Admit: 2018-05-07 | Discharge: 2018-05-07 | Disposition: A | Discharge status: Home / Self Care | Payer: PRIVATE HEALTH INSURANCE | Source: Ambulatory Visit | Attending: Vascular Surgery | Admitting: Vascular Surgery

## 2018-05-07 DIAGNOSIS — Z8249 Family history of ischemic heart disease and other diseases of the circulatory system: Secondary | ICD-10-CM | POA: Diagnosis not present

## 2018-05-07 DIAGNOSIS — Z87442 Personal history of urinary calculi: Secondary | ICD-10-CM | POA: Diagnosis not present

## 2018-05-07 DIAGNOSIS — Z79899 Other long term (current) drug therapy: Secondary | ICD-10-CM | POA: Diagnosis not present

## 2018-05-07 DIAGNOSIS — K760 Fatty (change of) liver, not elsewhere classified: Secondary | ICD-10-CM | POA: Insufficient documentation

## 2018-05-07 DIAGNOSIS — Z905 Acquired absence of kidney: Secondary | ICD-10-CM | POA: Diagnosis not present

## 2018-05-07 DIAGNOSIS — I129 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 kidney disease, or unspecified chronic kidney disease: Secondary | ICD-10-CM | POA: Insufficient documentation

## 2018-05-07 DIAGNOSIS — G4733 Obstructive sleep apnea (adult) (pediatric): Secondary | ICD-10-CM | POA: Diagnosis not present

## 2018-05-07 DIAGNOSIS — Z9989 Dependence on other enabling machines and devices: Secondary | ICD-10-CM | POA: Diagnosis not present

## 2018-05-07 DIAGNOSIS — M170 Bilateral primary osteoarthritis of knee: Secondary | ICD-10-CM | POA: Insufficient documentation

## 2018-05-07 DIAGNOSIS — N184 Chronic kidney disease, stage 4 (severe): Secondary | ICD-10-CM

## 2018-05-07 DIAGNOSIS — Z880 Allergy status to penicillin: Secondary | ICD-10-CM | POA: Insufficient documentation

## 2018-05-07 DIAGNOSIS — Z86711 Personal history of pulmonary embolism: Secondary | ICD-10-CM | POA: Diagnosis not present

## 2018-05-07 DIAGNOSIS — K219 Gastro-esophageal reflux disease without esophagitis: Secondary | ICD-10-CM | POA: Diagnosis not present

## 2018-05-07 HISTORY — DX: Pneumonia, unspecified organism: J18.9

## 2018-05-07 HISTORY — PX: AV FISTULA PLACEMENT: SHX1204

## 2018-05-07 LAB — POCT I-STAT 4, (NA,K, GLUC, HGB,HCT)
Glucose, Bld: 140 mg/dL — ABNORMAL HIGH (ref 70–99)
HCT: 34 % — ABNORMAL LOW (ref 39.0–52.0)
HEMOGLOBIN: 11.6 g/dL — AB (ref 13.0–17.0)
POTASSIUM: 4.4 mmol/L (ref 3.5–5.1)
Sodium: 140 mmol/L (ref 135–145)

## 2018-05-07 SURGERY — ARTERIOVENOUS (AV) FISTULA CREATION
Anesthesia: Monitor Anesthesia Care | Site: Arm Lower | Laterality: Left

## 2018-05-07 MED ORDER — PROPOFOL 500 MG/50ML IV EMUL
INTRAVENOUS | Status: DC | PRN
  Administered 2018-05-07: 75 ug/kg/min via INTRAVENOUS

## 2018-05-07 MED ORDER — LIDOCAINE-EPINEPHRINE (PF) 1 %-1:200000 IJ SOLN
INTRAMUSCULAR | Status: DC | PRN
  Administered 2018-05-07: 6 mL

## 2018-05-07 MED ORDER — LIDOCAINE 2% (20 MG/ML) 5 ML SYRINGE
INTRAMUSCULAR | Status: DC | PRN
  Administered 2018-05-07: 80 mg via INTRAVENOUS

## 2018-05-07 MED ORDER — SODIUM CHLORIDE 0.9 % IV SOLN
INTRAVENOUS | Status: AC
  Filled 2018-05-07: qty 1.2

## 2018-05-07 MED ORDER — ONDANSETRON HCL 4 MG/2ML IJ SOLN
INTRAMUSCULAR | Status: DC | PRN
  Administered 2018-05-07: 4 mg via INTRAVENOUS

## 2018-05-07 MED ORDER — PROTAMINE SULFATE 10 MG/ML IV SOLN
INTRAVENOUS | Status: AC
  Filled 2018-05-07: qty 5

## 2018-05-07 MED ORDER — ONDANSETRON HCL 4 MG/2ML IJ SOLN
INTRAMUSCULAR | Status: AC
  Filled 2018-05-07: qty 2

## 2018-05-07 MED ORDER — SODIUM CHLORIDE 0.9 % IV SOLN: INTRAVENOUS | Status: DC

## 2018-05-07 MED ORDER — FENTANYL CITRATE (PF) 250 MCG/5ML IJ SOLN
INTRAMUSCULAR | Status: AC
  Filled 2018-05-07: qty 5

## 2018-05-07 MED ORDER — MIDAZOLAM HCL 2 MG/2ML IJ SOLN
INTRAMUSCULAR | Status: AC
  Filled 2018-05-07: qty 2

## 2018-05-07 MED ORDER — LIDOCAINE-EPINEPHRINE (PF) 1 %-1:200000 IJ SOLN
INTRAMUSCULAR | Status: AC
  Filled 2018-05-07: qty 30

## 2018-05-07 MED ORDER — 0.9 % SODIUM CHLORIDE (POUR BTL) OPTIME
TOPICAL | Status: DC | PRN
  Administered 2018-05-07: 1000 mL

## 2018-05-07 MED ORDER — SODIUM CHLORIDE 0.9 % IV SOLN
INTRAVENOUS | Status: DC | PRN
  Administered 2018-05-07: 07:00:00 via INTRAVENOUS

## 2018-05-07 MED ORDER — LIDOCAINE 2% (20 MG/ML) 5 ML SYRINGE
INTRAMUSCULAR | Status: AC
  Filled 2018-05-07: qty 5

## 2018-05-07 MED ORDER — MIDAZOLAM HCL 5 MG/5ML IJ SOLN
INTRAMUSCULAR | Status: DC | PRN
  Administered 2018-05-07: 2 mg via INTRAVENOUS

## 2018-05-07 MED ORDER — PROPOFOL 10 MG/ML IV BOLUS
INTRAVENOUS | Status: AC
  Filled 2018-05-07: qty 40

## 2018-05-07 MED ORDER — FENTANYL CITRATE (PF) 100 MCG/2ML IJ SOLN
INTRAMUSCULAR | Status: DC | PRN
  Administered 2018-05-07: 50 ug via INTRAVENOUS

## 2018-05-07 MED ORDER — HEPARIN SODIUM (PORCINE) 1000 UNIT/ML IJ SOLN
INTRAMUSCULAR | Status: AC
  Filled 2018-05-07: qty 1

## 2018-05-07 MED ORDER — SODIUM CHLORIDE 0.9 % IV SOLN
INTRAVENOUS | Status: DC | PRN
  Administered 2018-05-07: 500 mL

## 2018-05-07 MED ORDER — OXYCODONE-ACETAMINOPHEN 5-325 MG PO TABS: 1.0000 | ORAL_TABLET | Freq: Four times a day (QID) | ORAL | 0 refills | Status: DC | PRN

## 2018-05-07 MED ORDER — HEPARIN SODIUM (PORCINE) 1000 UNIT/ML IJ SOLN
INTRAMUSCULAR | Status: DC | PRN
  Administered 2018-05-07: 3000 [IU] via INTRAVENOUS

## 2018-05-07 MED ORDER — SODIUM CHLORIDE 0.9 % IJ SOLN
INTRAMUSCULAR | Status: DC | PRN
  Administered 2018-05-07: 2 mL via TOPICAL

## 2018-05-07 SURGICAL SUPPLY — 35 items
ARMBAND PINK RESTRICT EXTREMIT (MISCELLANEOUS) ×6 IMPLANT
CANISTER SUCT 3000ML PPV (MISCELLANEOUS) ×3 IMPLANT
CLIP VESOCCLUDE MED 6/CT (CLIP) ×3 IMPLANT
CLIP VESOCCLUDE SM WIDE 6/CT (CLIP) ×3 IMPLANT
COVER PROBE W GEL 5X96 (DRAPES) ×3 IMPLANT
DECANTER SPIKE VIAL GLASS SM (MISCELLANEOUS) ×3 IMPLANT
DERMABOND ADVANCED (GAUZE/BANDAGES/DRESSINGS) ×2
DERMABOND ADVANCED .7 DNX12 (GAUZE/BANDAGES/DRESSINGS) ×1 IMPLANT
ELECT REM PT RETURN 9FT ADLT (ELECTROSURGICAL) ×3
ELECTRODE REM PT RTRN 9FT ADLT (ELECTROSURGICAL) ×1 IMPLANT
GLOVE BIO SURGEON STRL SZ7.5 (GLOVE) ×3 IMPLANT
GLOVE BIOGEL PI IND STRL 6 (GLOVE) ×2 IMPLANT
GLOVE BIOGEL PI IND STRL 6.5 (GLOVE) ×2 IMPLANT
GLOVE BIOGEL PI IND STRL 8 (GLOVE) ×1 IMPLANT
GLOVE BIOGEL PI INDICATOR 6 (GLOVE) ×4
GLOVE BIOGEL PI INDICATOR 6.5 (GLOVE) ×4
GLOVE BIOGEL PI INDICATOR 8 (GLOVE) ×2
GOWN STRL REUS W/ TWL LRG LVL3 (GOWN DISPOSABLE) ×2 IMPLANT
GOWN STRL REUS W/ TWL XL LVL3 (GOWN DISPOSABLE) ×2 IMPLANT
GOWN STRL REUS W/TWL LRG LVL3 (GOWN DISPOSABLE) ×4
GOWN STRL REUS W/TWL XL LVL3 (GOWN DISPOSABLE) ×4
HEMOSTAT SPONGE AVITENE ULTRA (HEMOSTASIS) IMPLANT
KIT BASIN OR (CUSTOM PROCEDURE TRAY) ×3 IMPLANT
KIT TURNOVER KIT B (KITS) ×3 IMPLANT
NS IRRIG 1000ML POUR BTL (IV SOLUTION) ×3 IMPLANT
PACK CV ACCESS (CUSTOM PROCEDURE TRAY) ×3 IMPLANT
PAD ARMBOARD 7.5X6 YLW CONV (MISCELLANEOUS) ×6 IMPLANT
SUT MNCRL AB 4-0 PS2 18 (SUTURE) ×3 IMPLANT
SUT PROLENE 6 0 BV (SUTURE) ×6 IMPLANT
SUT PROLENE 7 0 BV 1 (SUTURE) IMPLANT
SUT VIC AB 3-0 SH 27 (SUTURE) ×2
SUT VIC AB 3-0 SH 27X BRD (SUTURE) ×1 IMPLANT
TOWEL GREEN STERILE (TOWEL DISPOSABLE) ×3 IMPLANT
UNDERPAD 30X30 (UNDERPADS AND DIAPERS) ×3 IMPLANT
WATER STERILE IRR 1000ML POUR (IV SOLUTION) ×3 IMPLANT

## 2018-05-07 NOTE — H&P (Signed)
History and Physical Interval Note:  05/07/2018 7:19 AM  [name].  has presented today for surgery, with the diagnosis of chronic kidney disease  The various methods of treatment have been discussed with the patient and family. After consideration of risks, benefits and other options for treatment, the patient has consented to  Procedure(s): ARTERIOVENOUS (AV) RADIOCEPHALIC FISTULA CREATION (Left) as a surgical intervention .  The patient's history has been reviewed, patient examined, no change in status, stable for surgery.  I have reviewed the patient's chart and labs.  Questions were answered to the patient's satisfaction.    Left arm fistula.  Marty Heck  Patient name: George Lewis. MRN: [id number]        DOB: 1956/05/25          Sex: male  REASON FOR CONSULT: Dialysis access creation  HPI: George Lewis. is a 62 y.o. male with RCC scheduled for right nephrectomy on 05/16/2018, pancreatic lesion currently undergoing work-up, HTN, OSA, and stage IV chronic kidney disease that presents for permanent hemodialysis access creation.  Patient was recently hospitalized with bacteremia in the ICU (suspected UTI in setting of recent ureteral stent) and per the hospitalist notes he was noted to have CKD stage IV.  Moreover given the planned nephrectomy later this month there is concern that he may have worsening baseline renal function and he presents for dialysis creation.  He denies any previous hemodialysis access.  He has never had a hemodialysis catheter before.  He is not currently on dialysis.  Patient states he is right-handed.  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  Denies being on blood thinners.      Past Medical History:  Diagnosis Date  . Anemia   . Arthritis    knees  . Bilateral renal cysts 03/23/2018   Noted MRI ABD  . Cancer [facility])    right kidney new dx tuesday 04-10-18  .  Cholelithiasis 03/19/2018   noted on CT AB/Pelvis  . CKD (chronic kidney disease)   . Dehydration   . Diverticulosis of colon 04/19/2018   Noted on CT abd/pelvis  . GERD (gastroesophageal reflux disease)    hx of   . Hepatic steatosis 03/19/2018   Mild diffuse, noted on CT AB/Pelvis  . History of gross hematuria   . History of kidney stones   . Hypertension    off bp med lisinopril since june or july  . Left ureteral stone 03/19/2018   distal 62mm  . OSA on CPAP   . Pancreatic lesion    0.4cm cystic  . PE (pulmonary thromboembolism) ([place]) 03/2008  . Pulmonary nodule    Solid 17mm Right Lower Lobe  . 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 S/P ureteral stent placement: 04/16/2018 04/19/2018  . Sepsis ([place]) 03/2018  . UTI (urinary tract infection)          Past Surgical History:  Procedure Laterality Date  . CYSTOSCOPY/RETROGRADE/URETEROSCOPY/STONE EXTRACTION WITH BASKET     x2  . CYSTOSCOPY/URETEROSCOPY/HOLMIUM LASER/STENT PLACEMENT Left 04/16/2018   Procedure: CYSTOSCOPY/LEFT URETEROSCOPY/LEFT RETROGRADE/STENT PLACEMENT;  Surgeon: Lucas Mallow, MD;  Location: [facility];  Service: Urology;  Laterality: Left;  . EXTRACORPOREAL SHOCK WAVE LITHOTRIPSY     x 3  . left index finger attachment  18-19 yrs ago   3-4 surgeries  . TOE SURGERY  12-14 yrs ago  in beween 2nd and 3rd toes cyst removed         Family History  Problem Relation Age of Onset  . Alzheimer's disease Mother   . Alzheimer's disease Father   . Heart disease Father     SOCIAL HISTORY: Social History        Socioeconomic History  . Marital status: Married    Spouse name: Not on file  . Number of children: Not on file  . Years of education: Not on file  . Highest education level: Not on file  Occupational History  . Not on file  Social Needs  . Financial resource strain: Patient refused  . Food insecurity:     Worry: Never true    Inability: Never true  . Transportation needs:    Medical: No    Non-medical: No  Tobacco Use  . Smoking status: Never Smoker  . Smokeless tobacco: Never Used  Substance and Sexual Activity  . Alcohol use: Never    Frequency: Never  . Drug use: Never  . Sexual activity: Not on file  Lifestyle  . Physical activity:    Days per week: 0 days    Minutes per session: 0 min  . Stress: Not on file  Relationships  . Social connections:    Talks on phone: More than three times a week    Gets together: Never    Attends religious service: More than 4 times per year    Active member of club or organization: No    Attends meetings of clubs or organizations: Never    Relationship status: Married  . Intimate partner violence:    Fear of current or ex partner: Not on file    Emotionally abused: Not on file    Physically abused: Not on file    Forced sexual activity: Not on file  Other Topics Concern  . Not on file  Social History Narrative  . Not on file         Allergies  Allergen Reactions  . Penicillins Rash and Other (See Comments)    Has patient had a PCN reaction causing immediate rash, facial/tongue/throat swelling, SOB or lightheadedness with hypotension: yes Has patient had a PCN reaction causing severe rash involving mucus membranes or skin necrosis: no Has patient had a PCN reaction that required hospitalization: no Has patient had a PCN reaction occurring within the last 10 years: no If all of the above answers are "NO", then may proceed with Cephalosporin use.           Current Outpatient Medications  Medication Sig Dispense Refill  . acetaminophen (TYLENOL) 500 MG tablet Take 500-1,000 mg by mouth every 8 (eight) hours as needed for mild pain.     Marland Kitchen allopurinol (ZYLOPRIM) 300 MG tablet Take 300 mg by mouth every evening.   3  . diphenhydramine-acetaminophen (TYLENOL PM) 25-500 MG TABS tablet Take 3  tablets by mouth at bedtime as needed (pain).    Marland Kitchen diphenhydramine-acetaminophen (TYLENOL PM) 25-500 MG TABS tablet Take 1 tablet by mouth at bedtime as needed (sleep).    . fluticasone (FLONASE) 50 MCG/ACT nasal spray Place 2 sprays into both nostrils daily as needed for allergies or rhinitis.    Marland Kitchen linezolid (ZYVOX) 600 MG tablet Take 1 tablet (600 mg total) by mouth every 12 (twelve) hours for 14 days. 28 tablet 0  . loratadine (CLARITIN) 10 MG tablet Take 10 mg by mouth daily.    . metoprolol tartrate (LOPRESSOR) 25  MG tablet Take 1 tablet (25 mg total) by mouth 2 (two) times daily. 60 tablet 0  . omeprazole (PRILOSEC OTC) 20 MG tablet Take 40 mg by mouth daily.     . Potassium Citrate 15 MEQ (1620 MG) TBCR Take 1 tablet by mouth every evening.   3  . tamsulosin (FLOMAX) 0.4 MG CAPS capsule Take 0.4 mg by mouth every evening.   3  . HYDROcodone-acetaminophen (NORCO/VICODIN) 5-325 MG tablet Take 1 tablet by mouth every 4 (four) hours as needed for moderate pain. (Patient not taking: Reported on 05/01/2018) 10 tablet 0   No current facility-administered medications for this visit.     REVIEW OF SYSTEMS:  [X]  denotes positive finding, [ ]  denotes negative finding Cardiac  Comments:  Chest pain or chest pressure:    Shortness of breath upon exertion: x   Short of breath when lying flat:    Irregular heart rhythm:        Vascular    Pain in calf, thigh, or hip brought on by ambulation:    Pain in feet at night that wakes you up from your sleep:     Blood clot in your veins:    Leg swelling:         Pulmonary    Oxygen at home:    Productive cough:     Wheezing:         Neurologic    Sudden weakness in arms or legs:     Sudden numbness in arms or legs:     Sudden onset of difficulty speaking or slurred speech:    Temporary loss of vision in one eye:     Problems with dizziness:         Gastrointestinal    Blood in  stool:     Vomited blood:         Genitourinary    Burning when urinating:     Blood in urine:        Psychiatric    Major depression:         Hematologic    Bleeding problems:    Problems with blood clotting too easily:        Skin    Rashes or ulcers:        Constitutional    Fatigue: x     PHYSICAL EXAM:    Vitals:   05/01/18 0843  BP: 100/65  Pulse: 76  Resp: 18  Temp: (!) 97.4 F (36.3 C)  TempSrc: Oral  SpO2: 97%  Weight: (!) 140.6 kg  Height: 6\' 1"  (1.854 m)    GENERAL: The patient is a well-nourished male, in no acute distress. The vital signs are documented above. CARDIAC: There is a regular rate and rhythm.  VASCULAR:  2+ palpable radial pulse bilateral upper extremities 2+ brachial pulse palpable bilateral upper extremities No tissue loss either upper extremity Hands are both warm and motor and sensory intact. PULMONARY: There is good air exchange bilaterally without wheezing or rales. ABDOMEN: Soft and non-tender with normal pitched bowel sounds.  MUSCULOSKELETAL: There are no major deformities or cyanosis. NEUROLOGIC: No focal weakness or paresthesias are detected. SKIN: There are no ulcers or rashes noted. PSYCHIATRIC: The patient has a normal affect.  DATA:   I have independently reviewed his upper extremity vein mapping and duplex study.  He has nice triphasic waveforms of the bilateral upper extremities.  His left cephalic vein appears adequate at the distal forearm at 3.0 mm and then  empties into a nice basilic vein in the left upper arm that is over 4.0 mm.  Assessment/Plan:  62 year old male with multiple medical comorbidities including stage IV chronic kidney disease and planned right nephrectomy later this month for RCC.  We will plan for a left arm fistula since he is right-hand dominant.  His left cephalic vein at the wrist appears adequate and that empties into a nice basilic vein in  the upper arm.  Discussed with patient plan for a radiocephalic fistula in the left wrist.  We discussed risks and benefits including bleeding, infection, steal, failure to mature.  Discussed that if he needs a catheter after his nephrectomy this could be placed in the hospital since he is not currently on hemodialysis and surgery scheduled for later in September.   Marty Heck, MD [facility] Office: [phone] Pager: [place]

## 2018-05-07 NOTE — Telephone Encounter (Signed)
sch appt [facility] 06/08/18 4pm Dialysis Duplex 06/12/18 1130am p/o MD

## 2018-05-07 NOTE — Op Note (Signed)
OPERATIVE NOTE   PROCEDURE: 1. Left radiocephaic arteriovenous fistula placement  PRE-OPERATIVE DIAGNOSIS: Stage IV CKD  POST-OPERATIVE DIAGNOSIS: same  SURGEON: Monica Martinez, MD  ANESTHESIA: MAC  ESTIMATED BLOOD LOSS: Minimal  FINDING(S): 1.  Cephalic vein: 3 mm, acceptable 2.  Radial artery: 2.5 mm, borderline artery given size 3.  Venous outflow: palpable thrill  4.  Radial flow: dopplerable radial signal  SPECIMEN(S):  none  INDICATIONS:   George Corlew. is a 62 y.o. male who presents with stage IV CKD for permanent dialysis access.  The patient is scheduled for left radiocephalic arteriovenous fistula placement.  The patient is aware the risks include but are not limited to: bleeding, infection, steal syndrome, nerve damage, ischemic monomelic neuropathy, failure to mature, and need for additional procedures.  The patient is aware of the risks of the procedure and elects to proceed forward.  DESCRIPTION: After full informed written consent was obtained from the patient, the patient was brought back to the operating room and placed supine upon the operating table.  Prior to induction, the patient received IV antibiotics.   After obtaining adequate anesthesia, the patient was then prepped and draped in the standard fashion for a left arm access procedure.   I turned my attention first to identifying the patient's distal cephalic vein and radial artery.  Using SonoSite guidance, the location of these vessels were marked out on the skin.  The cephalic vein at the wrist measured 3 mm and was adequate.  The artery was borderline given 2.5 mm size, but I felt it was worth trying given his age.   At this point, I injected local anesthetic to obtain a field block of the wrist.  In total, I injected about 6 mL of 1% lidocaine without epinephrine.  I made a longitudinal incision at the level of the wrist and dissected through the subcutaneous tissue and fascia to gain  exposure of the radial artery.  This was noted to be 2.5 mm in diameter externally.  This was dissected out proximally and distally and controlled with vessel loops .  I then dissected out the cephalic vein.  This was noted to be 3 mm in diameter externally.  The distal segment of the vein was ligated with a  2-0 silk, and the vein was transected.  The proximal segment was interrogated with serial dilators.  The vein accepted up to a 3.5 mm dilator without any difficulty.  I then instilled the heparinized saline into the vein and clamped it.  At this point, I reset my exposure of the radial artery and placed the artery under tension proximally and distally.  I made an arteriotomy with a #11 blade, and then I extended the arteriotomy with a Potts scissor.  I injected heparinized saline proximal and distal to this arteriotomy.  The vein was then sewn to the artery in an end-to-side configuration with a running stitch of 6-0 Prolene.  Prior to completing this anastomosis, I allowed the vein and artery to backbleed.  There was no evidence of clot from any vessels.  I completed the anastomosis in the usual fashion and then released all vessel loops and clamps.    There was a faintly palpable thrill in the venous outflow, and there was a dopplerable radial signal.  At this point, I irrigated out the surgical wound.  There was no further active bleeding.  The subcutaneous tissue was reapproximated with a running stitch of 3-0 Vicryl.  The skin was then  reapproximated with a running subcuticular stitch of 4-0 Monocryl.  The skin was then cleaned, dried, and reinforced with Dermabond.  The patient tolerated this procedure well.   COMPLICATIONS: None  CONDITION: Stable  Monica Martinez, MD [facility] Office: [phone] Pager: [phone]  05/07/2018, 8:37 AM

## 2018-05-07 NOTE — Anesthesia Preprocedure Evaluation (Addendum)
Anesthesia Evaluation  Patient identified by MRN, date of birth, ID band Patient awake    Reviewed: Allergy & Precautions, NPO status , Patient's Chart, lab work & pertinent test results  History of Anesthesia Complications Negative for: history of anesthetic complications  Airway Mallampati: III  TM Distance: >3 FB Neck ROM: Full    Dental  (+) Teeth Intact, Dental Advisory Given   Pulmonary neg shortness of breath, sleep apnea and Continuous Positive Airway Pressure Ventilation ,    breath sounds clear to auscultation       Cardiovascular hypertension, Pt. on medications and Pt. 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   Neuro/Psych negative neurological ROS  negative psych ROS   GI/Hepatic Neg liver ROS, GERD  Medicated and Controlled,  Endo/Other  Morbid obesityGout   Renal/GU CRFRenal diseaseRight renal Ca Left ureteral calculus  negative genitourinary   Musculoskeletal  (+) Arthritis , Osteoarthritis,    Abdominal (+) + obese,   Peds  Hematology  (+) anemia ,   Anesthesia Other Findings   Reproductive/Obstetrics                            Anesthesia Physical Anesthesia Plan  ASA: III  Anesthesia Plan: MAC   Post-op Pain Management:    Induction:   PONV Risk Score and Plan: 1 and Ondansetron  Airway Management Planned: Nasal Cannula  Additional Equipment: None  Intra-op Plan:   Post-operative Plan:   Informed Consent: I have reviewed the patients History and Physical, chart, labs and discussed the procedure including the risks, benefits and alternatives for the proposed anesthesia with the patient or authorized representative who has indicated his/her understanding and acceptance.   Dental advisory given  Plan Discussed with: CRNA and Surgeon  Anesthesia Plan Comments:         Anesthesia  Quick Evaluation

## 2018-05-07 NOTE — Discharge Instructions (Signed)
° °  [facility]  Discharge Instructions  AV Fistula or Graft Surgery for Dialysis Access  Please refer to the following instructions for your post-procedure care. Your surgeon or physician assistant will discuss any changes with you.  Activity  You may drive the day following your surgery, if you are comfortable and no longer taking prescription pain medication. Resume full activity as the soreness in your incision resolves.  Bathing/Showering  You may shower after you go home. Keep your incision dry for 48 hours. Do not soak in a bathtub, hot tub, or swim until the incision heals completely. You may not shower if you have a hemodialysis catheter.  Incision Care  Clean your incision with mild soap and water after 48 hours. Pat the area dry with a clean towel. You do not need a bandage unless otherwise instructed. Do not apply any ointments or creams to your incision. You may have skin glue on your incision. Do not peel it off. It will come off on its own in about one week. Your arm may swell a bit after surgery. To reduce swelling use pillows to elevate your arm so it is above your heart. Your doctor will tell you if you need to lightly wrap your arm with an ACE bandage.  Diet  Resume your normal diet. There are not special food restrictions following this procedure. In order to heal from your surgery, it is CRITICAL to get adequate nutrition. Your body requires vitamins, minerals, and protein. Vegetables are the best source of vitamins and minerals. Vegetables also provide the perfect balance of protein. Processed food has little nutritional value, so try to avoid this.  Medications  Resume taking all of your medications. If your incision is causing pain, you may take over-the counter pain relievers such as acetaminophen (Tylenol). If you were prescribed a stronger pain medication, please be aware these medications can cause nausea and constipation. Prevent  nausea by taking the medication with a snack or meal. Avoid constipation by drinking plenty of fluids and eating foods with high amount of fiber, such as fruits, vegetables, and grains.  Do not take Tylenol if you are taking prescription pain medications.  Follow up Your surgeon may want to see you in the office following your access surgery. If so, this will be arranged at the time of your surgery.  Please call us immediately for any of the following conditions:  Increased pain, redness, drainage (pus) from your incision site Fever of 101 degrees or higher Severe or worsening pain at your incision site Hand pain or numbness.  Reduce your risk of vascular disease:  Stop smoking. If you would like help, call [facility] at 1-800-QUIT-NOW 816-194-5613) or [place] [facility] [place] your cholesterol Maintain a desired weight Control your diabetes Keep your blood pressure down  Dialysis  It will take several weeks to several months for your new dialysis access to be ready for use. Your surgeon will determine when it is okay to use it. Your nephrologist will continue to direct your dialysis. You can continue to use your Permcath until your new access is ready for use.   05/07/2018 George Lewis [id number] [birth year]  Surgeon(s): Marty Heck, MD  Procedure(s): Creation of Left arm Radiocephalic Fistula         Do not stick for 4-6 weeks until followup with vascular surgery    If you have any questions, please call the office at [phone].

## 2018-05-07 NOTE — Transfer of Care (Signed)
Immediate Anesthesia Transfer of Care Note  Patient: George Lewis.  Procedure(s) Performed: Creation of Left arm Radiocephalic Fistula (Left Arm Lower)  Patient Location: PACU  Anesthesia Type:MAC  Level of Consciousness: awake, oriented and patient cooperative  Airway & Oxygen Therapy: Patient Spontanous Breathing and Patient connected to face mask oxygen  Post-op Assessment: Report given to RN and Post -op Vital signs reviewed and stable  Post vital signs: Reviewed  Last Vitals:  Vitals Value Taken Time  BP 116/72 05/07/2018  8:54 AM  Temp    Pulse 85 05/07/2018  8:53 AM  Resp 15 05/07/2018  8:54 AM  SpO2 99 % 05/07/2018  8:53 AM  Vitals shown include unvalidated device data.  Last Pain:  Vitals:   05/07/18 0556  PainSc: 2       Patients Stated Pain Goal: 3 (98/42/10 3128)  Complications: No apparent anesthesia complications

## 2018-05-07 NOTE — Anesthesia Procedure Notes (Signed)
Procedure Name: MAC Date/Time: 05/07/2018 7:40 AM Performed by: Jenne Campus, CRNA Pre-anesthesia Checklist: Patient identified, Emergency Drugs available, Suction available and Patient being monitored Oxygen Delivery Method: Simple face mask

## 2018-05-08 ENCOUNTER — Other Ambulatory Visit: Payer: Self-pay

## 2018-05-08 DIAGNOSIS — N189 Chronic kidney disease, unspecified: Secondary | ICD-10-CM

## 2018-05-09 ENCOUNTER — Encounter (HOSPITAL_COMMUNITY): Payer: Self-pay | Admitting: Vascular Surgery

## 2018-05-11 NOTE — Anesthesia Postprocedure Evaluation (Signed)
Anesthesia Post Note  Patient: George Lewis.  Procedure(s) Performed: Creation of Left arm Radiocephalic Fistula (Left Arm Lower)     Patient location during evaluation: PACU Anesthesia Type: MAC Level of consciousness: awake and alert Pain management: pain level controlled Vital Signs Assessment: post-procedure vital signs reviewed and stable Respiratory status: spontaneous breathing, nonlabored ventilation, respiratory function stable and patient connected to nasal cannula oxygen Cardiovascular status: stable and blood pressure returned to baseline Postop Assessment: no apparent nausea or vomiting Anesthetic complications: no    Last Vitals:  Vitals:   05/07/18 0900 05/07/18 0918  BP:  133/81  Pulse: 92 73  Resp: (!) 22 20  Temp:    SpO2: 97% 98%    Last Pain:  Vitals:   05/07/18 0918  PainSc: 0-No pain                 Babacar Haycraft

## 2018-05-15 NOTE — Anesthesia Preprocedure Evaluation (Addendum)
Anesthesia Evaluation  Patient identified by MRN, date of birth, ID band Patient awake    Reviewed: Allergy & Precautions, NPO status , Patient's Chart, lab work & pertinent test results, reviewed documented beta blocker date and time   History of Anesthesia Complications Negative for: history of anesthetic complications  Airway Mallampati: IV  TM Distance: >3 FB Neck ROM: Full    Dental  (+) Dental Advisory Given   Pulmonary sleep apnea and Continuous Positive Airway Pressure Ventilation ,    breath sounds clear to auscultation       Cardiovascular hypertension, Pt. on medications and Pt. on home beta blockers (-) angina Rhythm:Regular Rate:Normal     Neuro/Psych negative neurological ROS     GI/Hepatic Neg liver ROS, GERD  Medicated,  Endo/Other  Morbid obesity  Renal/GU ESRFRenal disease (not on dialysis yet)     Musculoskeletal  (+) Arthritis ,   Abdominal (+) + obese,   Peds  Hematology negative hematology ROS (+)   Anesthesia Other Findings   Reproductive/Obstetrics                            Anesthesia Physical Anesthesia Plan  ASA: III  Anesthesia Plan: General   Post-op Pain Management:    Induction: Intravenous  PONV Risk Score and Plan: 3 and Ondansetron, Dexamethasone and Treatment may vary due to age or medical condition  Airway Management Planned: Video Laryngoscope Planned and Oral ETT  Additional Equipment:   Intra-op Plan:   Post-operative Plan: Extubation in OR  Informed Consent: I have reviewed the patients History and Physical, chart, labs and discussed the procedure including the risks, benefits and alternatives for the proposed anesthesia with the patient or authorized representative who has indicated his/her understanding and acceptance.   Dental advisory given  Plan Discussed with: CRNA and Surgeon  Anesthesia Plan Comments: (Plan routine monitors,  GETA )        Anesthesia Quick Evaluation

## 2018-05-15 NOTE — H&P (Signed)
CC: I have a renal mass.  HPI: George Lewis is a 62 year-old male established patient who is here for a renal mass.  The problem is on the right side. His problem was diagnosed 03/19/2018. His symptoms include flank pain, back pain, and blood in urine. Patient denies having groin pain, nausea, vomiting, fever, and chills. He had the following x-rays done: CT Scan and MRI Scan. He has had kidney surgery.   He has seen blood in his urine. He does have a good appetite. BOWEL HABITS: his bowels are moving normally. He is having pain in new locations. He has not recently had unwanted weight loss.   03/28/2018:  Patient underwent a CT scan of the abdomen and pelvis without contrast on 03/19/2018. This revealed a complex right renal mass. He subsequently underwent an MRI without contrast due to his renal function. This revealed a 10.9 x 6.7 x 8.8 cm right renal mass highly suspicious for renal cell carcinoma however it was incompletely characterized due to the lack of IV contrast. The patient has been having right-sided flank pain and gross hematuria. He however also has a 3 mm left distal ureteral calculus. He has not seen a stone passed. However, he has been experiencing gross hematuria and not straining his urine. He has no pain on the left side. GFR on 02/07/2018 was 29 and creatinine of 2.37.   04/25/2018:  In the interval, the patient underwent a left ureteroscopy for a suspected left ureteral calculus. He was unfortunately admitted several days later with bacteremia. He is currently on linezolid. He presents for ureteral stent removal. In the interval, he is also seeing Dr. 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 This was rescheduled to next Thursday.     ALLERGIES: Penicillin - Hives    MEDICATIONS: Cipro 500 mg tablet  Tamsulosin Hcl 0.4 mg capsule  Allergy Relief  Potassium Citrate  Tylenol     GU PSH: Cystoscopy Insert Stent, Left -  04/16/2018 Cystoscopy Ureteroscopy, Left - 04/16/2018      PSH Notes: LITHO   NON-GU PSH: Foot surgery (unspecified), Left Hand/finger Surgery, Left, index        GU PMH: Right renal neoplasm - 03/28/2018 Ureteral calculus - 03/28/2018 Renal calculus    NON-GU PMH: GERD Gout Hypercholesterolemia Hypertension Pulmonary Embolism, History Sleep Apnea    FAMILY HISTORY: 1 Daughter - Other Alzheimer's Disease - Father, Mother Kidney Stones - Sister Thyroid Cancer - Mother   SOCIAL HISTORY: Marital Status: Married Preferred Language: English; Race: White Current Smoking Status: Patient has never smoked.  <DIV'  Tobacco Use Assessment Completed:  Used Tobacco in last 30 days?   Drinks 4+ caffeinated drinks per day.    REVIEW OF SYSTEMS:     GU Review Male:  Patient denies frequent urination, hard to postpone urination, burning/ pain with urination, get up at night to urinate, leakage of urine, stream starts and stops, trouble starting your stream, have to strain to urinate , erection problems, and penile pain.    Gastrointestinal (Upper):  Patient denies nausea, vomiting, and indigestion/ heartburn.    Gastrointestinal (Lower):  Patient denies diarrhea and constipation.    Constitutional:  Patient reports fever. Patient denies night sweats, weight loss, and fatigue.    Skin:  Patient denies skin rash/ lesion and itching.    Eyes:  Patient denies blurred vision and double vision.    Ears/ Nose/ Throat:  Patient denies sore throat and sinus problems.  Hematologic/Lymphatic:  Patient denies swollen glands and easy bruising.    Cardiovascular:  Patient denies chest pains and leg swelling.    Respiratory:  Patient denies cough and shortness of breath.    Endocrine:  Patient denies excessive thirst.    Musculoskeletal:  Patient denies back pain and joint pain.    Neurological:  Patient denies headaches and dizziness.    Psychologic:  Patient denies depression and anxiety.    VITAL  SIGNS:       04/25/2018 01:03 PM     Weight 315 lb / 142.88 kg     Height 72 in / 182.88 cm     BP 127/86 mmHg     Heart Rate 99 /min     Temperature 99.0 F / 37.2 C     BMI 42.7 kg/m     MULTI-SYSTEM PHYSICAL EXAMINATION:      Constitutional: Well-nourished. No physical deformities. Normally developed. Good grooming.     Respiratory: No labored breathing, no use of accessory muscles.      Cardiovascular: Normal temperature, adequate perfusion of extremities     Skin: No paleness, no jaundice     Neurologic / Psychiatric: Oriented to time, oriented to place, oriented to person. No depression, no anxiety, no agitation.     Gastrointestinal: No mass, no tenderness, no rigidity, morbidly obese abdomen. No abdominal scars. Small umbilical hernia      Eyes: Normal conjunctivae. Normal eyelids.     Musculoskeletal: Normal gait and station of head and neck.            PAST DATA REVIEWED:   Source Of History:  Patient  Records Review:  Previous Patient Records   PROCEDURES:    Flexible Cystoscopy Left Stent Removal - 52310  Risks, benefits, and some of the potential complications of the procedure were discussed at length with the patient including infection, bleeding, voiding discomfort, urinary retention, fever, chills, sepsis, and others. All questions were answered. Informed consent was obtained. Antibiotic prophylaxis was given. Sterile technique and intraurethral analgesia were used.  Meatus:  Normal size. Normal location. Normal condition.  Urethra:  No strictures.  External Sphincter:  Normal.  Verumontanum:  Normal.  Prostate:  Borderline obstructing. Mild hyperplasia.  Bladder Neck:  Non-obstructing.  Ureteral Orifices:  Normal location. Normal size. Normal shape.   Bladder:  No trabeculation. No tumors. Normal mucosa. No stones.  The left ureteral stent was carefully removed with a grasping instrument.    The lower urinary tract was carefully examined. The procedure was  well-tolerated and without complications. Antibiotic instructions were given. Instructions were given to call the office immediately for bloody urine, difficulty urinating, urinary retention, painful or frequent urination, fever, chills, nausea, vomiting or other illness. The patient stated that he understood these instructions and would comply with them.    ASSESSMENT:     ICD-10 Details  1 GU:  Right renal neoplasm - D49.511   2  Benign Neo Kidney, Unspec - D30.00    PLAN:   Document  Letter(s):  Created for Patient: Clinical Summary   Notes:  Stent removed today.   I will go ahead and start working on tentatively scheduled him for a right hand-assisted laparoscopic nephrectomy, possible open. He understands we will continue with the plan to biopsy his pancreatic mass prior to proceeding. Plan to present his case at our next tumor board the second Friday of September. By this time, his pathology will hopefully be back from the pancreatic mass biopsy.  Cc: Stark Klein, M.D.   Signed by Link Snuffer, III, M.D. on 04/25/18 at 1:20 PM (EDT

## 2018-05-16 ENCOUNTER — Inpatient Hospital Stay (HOSPITAL_COMMUNITY)
Admission: RE | Admit: 2018-05-16 | Discharge: 2018-05-21 | DRG: 657 | Disposition: A | Discharge status: Home Health | Payer: PRIVATE HEALTH INSURANCE | Source: Ambulatory Visit | Attending: Urology | Admitting: Urology

## 2018-05-16 ENCOUNTER — Inpatient Hospital Stay (HOSPITAL_COMMUNITY): Payer: PRIVATE HEALTH INSURANCE | Admitting: Anesthesiology

## 2018-05-16 ENCOUNTER — Encounter (HOSPITAL_COMMUNITY): Payer: Self-pay | Admitting: Emergency Medicine

## 2018-05-16 ENCOUNTER — Other Ambulatory Visit: Payer: Self-pay

## 2018-05-16 ENCOUNTER — Encounter (HOSPITAL_COMMUNITY)
Admission: RE | Disposition: A | Discharge status: Home Health | Payer: Self-pay | Source: Ambulatory Visit | Attending: Urology

## 2018-05-16 DIAGNOSIS — M170 Bilateral primary osteoarthritis of knee: Secondary | ICD-10-CM | POA: Diagnosis present

## 2018-05-16 DIAGNOSIS — Z841 Family history of disorders of kidney and ureter: Secondary | ICD-10-CM | POA: Diagnosis not present

## 2018-05-16 DIAGNOSIS — K760 Fatty (change of) liver, not elsewhere classified: Secondary | ICD-10-CM | POA: Diagnosis present

## 2018-05-16 DIAGNOSIS — Z7951 Long term (current) use of inhaled steroids: Secondary | ICD-10-CM | POA: Diagnosis not present

## 2018-05-16 DIAGNOSIS — E875 Hyperkalemia: Secondary | ICD-10-CM | POA: Diagnosis present

## 2018-05-16 DIAGNOSIS — Z87442 Personal history of urinary calculi: Secondary | ICD-10-CM | POA: Diagnosis not present

## 2018-05-16 DIAGNOSIS — R310 Gross hematuria: Secondary | ICD-10-CM | POA: Diagnosis present

## 2018-05-16 DIAGNOSIS — N179 Acute kidney failure, unspecified: Secondary | ICD-10-CM | POA: Diagnosis present

## 2018-05-16 DIAGNOSIS — N184 Chronic kidney disease, stage 4 (severe): Secondary | ICD-10-CM | POA: Diagnosis present

## 2018-05-16 DIAGNOSIS — M109 Gout, unspecified: Secondary | ICD-10-CM | POA: Diagnosis present

## 2018-05-16 DIAGNOSIS — K869 Disease of pancreas, unspecified: Secondary | ICD-10-CM | POA: Diagnosis present

## 2018-05-16 DIAGNOSIS — N2889 Other specified disorders of kidney and ureter: Secondary | ICD-10-CM | POA: Diagnosis present

## 2018-05-16 DIAGNOSIS — I10 Essential (primary) hypertension: Secondary | ICD-10-CM | POA: Diagnosis not present

## 2018-05-16 DIAGNOSIS — C641 Malignant neoplasm of right kidney, except renal pelvis: Secondary | ICD-10-CM | POA: Diagnosis present

## 2018-05-16 DIAGNOSIS — K429 Umbilical hernia without obstruction or gangrene: Secondary | ICD-10-CM | POA: Diagnosis present

## 2018-05-16 DIAGNOSIS — E7800 Pure hypercholesterolemia, unspecified: Secondary | ICD-10-CM | POA: Diagnosis present

## 2018-05-16 DIAGNOSIS — Z23 Encounter for immunization: Secondary | ICD-10-CM

## 2018-05-16 DIAGNOSIS — Z880 Allergy status to penicillin: Secondary | ICD-10-CM | POA: Diagnosis not present

## 2018-05-16 DIAGNOSIS — I5030 Unspecified diastolic (congestive) heart failure: Secondary | ICD-10-CM | POA: Diagnosis not present

## 2018-05-16 DIAGNOSIS — I472 Ventricular tachycardia: Secondary | ICD-10-CM | POA: Diagnosis not present

## 2018-05-16 DIAGNOSIS — K567 Ileus, unspecified: Secondary | ICD-10-CM

## 2018-05-16 DIAGNOSIS — K219 Gastro-esophageal reflux disease without esophagitis: Secondary | ICD-10-CM | POA: Diagnosis present

## 2018-05-16 DIAGNOSIS — Z6841 Body Mass Index (BMI) 40.0 and over, adult: Secondary | ICD-10-CM | POA: Diagnosis not present

## 2018-05-16 DIAGNOSIS — Z8249 Family history of ischemic heart disease and other diseases of the circulatory system: Secondary | ICD-10-CM

## 2018-05-16 DIAGNOSIS — Z808 Family history of malignant neoplasm of other organs or systems: Secondary | ICD-10-CM

## 2018-05-16 DIAGNOSIS — G4733 Obstructive sleep apnea (adult) (pediatric): Secondary | ICD-10-CM | POA: Diagnosis present

## 2018-05-16 DIAGNOSIS — K5730 Diverticulosis of large intestine without perforation or abscess without bleeding: Secondary | ICD-10-CM | POA: Diagnosis present

## 2018-05-16 DIAGNOSIS — Z86711 Personal history of pulmonary embolism: Secondary | ICD-10-CM | POA: Diagnosis not present

## 2018-05-16 DIAGNOSIS — R52 Pain, unspecified: Secondary | ICD-10-CM

## 2018-05-16 DIAGNOSIS — Z820 Family history of epilepsy and other diseases of the nervous system: Secondary | ICD-10-CM

## 2018-05-16 DIAGNOSIS — I129 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 kidney disease, or unspecified chronic kidney disease: Secondary | ICD-10-CM | POA: Diagnosis present

## 2018-05-16 DIAGNOSIS — I471 Supraventricular tachycardia: Secondary | ICD-10-CM | POA: Diagnosis not present

## 2018-05-16 HISTORY — PX: LAPAROSCOPIC NEPHRECTOMY, HAND ASSISTED: SHX1929

## 2018-05-16 LAB — BASIC METABOLIC PANEL
Anion gap: 12 (ref 5–15)
BUN: 29 mg/dL — AB (ref 6–20)
CALCIUM: 8 mg/dL — AB (ref 8.9–10.3)
CO2: 21 mmol/L — AB (ref 22–32)
CREATININE: 2.62 mg/dL — AB (ref 0.61–1.24)
Chloride: 107 mmol/L (ref 98–111)
GFR calc non Af Amer: 25 mL/min — ABNORMAL LOW (ref >60)
GFR, EST AFRICAN AMERICAN: 29 mL/min — AB (ref >60)
Glucose, Bld: 200 mg/dL — ABNORMAL HIGH (ref 70–99)
Potassium: 4.7 mmol/L (ref 3.5–5.1)
Sodium: 140 mmol/L (ref 135–145)

## 2018-05-16 LAB — HEMOGLOBIN AND HEMATOCRIT, BLOOD
HCT: 34.3 % — ABNORMAL LOW (ref 39.0–52.0)
HEMOGLOBIN: 10.9 g/dL — AB (ref 13.0–17.0)

## 2018-05-16 LAB — ABO/RH: ABO/RH(D): A POS

## 2018-05-16 LAB — TYPE AND SCREEN
ABO/RH(D): A POS
Antibody Screen: NEGATIVE

## 2018-05-16 SURGERY — NEPHRECTOMY, HAND-ASSISTED, LAPAROSCOPIC
Anesthesia: General | Laterality: Right

## 2018-05-16 MED ORDER — OXYCODONE HCL 5 MG/5ML PO SOLN: 5.0000 mg | Freq: Once | ORAL | Status: DC | PRN

## 2018-05-16 MED ORDER — METOPROLOL TARTRATE 25 MG PO TABS: 25.0000 mg | ORAL_TABLET | Freq: Two times a day (BID) | ORAL | Status: DC

## 2018-05-16 MED ORDER — PHENYLEPHRINE HCL 10 MG/ML IJ SOLN
INTRAMUSCULAR | Status: AC
  Filled 2018-05-16: qty 1

## 2018-05-16 MED ORDER — PHENYLEPHRINE HCL 10 MG/ML IJ SOLN
INTRAVENOUS | Status: DC | PRN
  Administered 2018-05-16: 25 ug/min via INTRAVENOUS

## 2018-05-16 MED ORDER — LORATADINE 10 MG PO TABS
10.0000 mg | ORAL_TABLET | Freq: Every evening | ORAL | Status: DC
  Administered 2018-05-16 – 2018-05-20 (×5): 10 mg via ORAL
  Filled 2018-05-16 (×5): qty 1

## 2018-05-16 MED ORDER — FENTANYL CITRATE (PF) 100 MCG/2ML IJ SOLN: 25.0000 ug | INTRAMUSCULAR | Status: DC | PRN

## 2018-05-16 MED ORDER — VANCOMYCIN HCL 10 G IV SOLR
1500.0000 mg | Freq: Once | INTRAVENOUS | Status: DC
  Filled 2018-05-16: qty 1500

## 2018-05-16 MED ORDER — HEMOSTATIC AGENTS (NO CHARGE) OPTIME
TOPICAL | Status: DC | PRN
  Administered 2018-05-16: 2 via TOPICAL

## 2018-05-16 MED ORDER — HYDROMORPHONE HCL 1 MG/ML IJ SOLN
INTRAMUSCULAR | Status: AC
  Administered 2018-05-16: 0.25 mg via INTRAVENOUS
  Filled 2018-05-16: qty 1

## 2018-05-16 MED ORDER — LIDOCAINE 2% (20 MG/ML) 5 ML SYRINGE
INTRAMUSCULAR | Status: DC | PRN
  Administered 2018-05-16: 100 mg via INTRAVENOUS

## 2018-05-16 MED ORDER — ALLOPURINOL 300 MG PO TABS
300.0000 mg | ORAL_TABLET | Freq: Every evening | ORAL | Status: DC
  Administered 2018-05-16 – 2018-05-20 (×5): 300 mg via ORAL
  Filled 2018-05-16 (×2): qty 1
  Filled 2018-05-16: qty 3
  Filled 2018-05-16 (×2): qty 1

## 2018-05-16 MED ORDER — CLINDAMYCIN PHOSPHATE 900 MG/50ML IV SOLN
900.0000 mg | Freq: Once | INTRAVENOUS | Status: AC
  Administered 2018-05-16: 900 mg via INTRAVENOUS
  Filled 2018-05-16: qty 50

## 2018-05-16 MED ORDER — OXYCODONE HCL 5 MG PO TABS: 5.0000 mg | ORAL_TABLET | Freq: Once | ORAL | Status: DC | PRN

## 2018-05-16 MED ORDER — SODIUM CHLORIDE 0.9 % IJ SOLN
INTRAMUSCULAR | Status: DC | PRN
  Administered 2018-05-16: 20 mL

## 2018-05-16 MED ORDER — HYDROMORPHONE HCL 1 MG/ML IJ SOLN
INTRAMUSCULAR | Status: AC
  Administered 2018-05-16: 0.5 mg via INTRAVENOUS
  Filled 2018-05-16: qty 1

## 2018-05-16 MED ORDER — SODIUM CHLORIDE 0.9 % IV SOLN
INTRAVENOUS | Status: DC
  Administered 2018-05-16 (×2): via INTRAVENOUS

## 2018-05-16 MED ORDER — BACITRACIN-NEOMYCIN-POLYMYXIN 400-5-5000 EX OINT: 1.0000 "application " | TOPICAL_OINTMENT | Freq: Three times a day (TID) | CUTANEOUS | Status: DC | PRN

## 2018-05-16 MED ORDER — SODIUM CHLORIDE 0.9 % IJ SOLN
INTRAMUSCULAR | Status: AC
  Filled 2018-05-16: qty 20

## 2018-05-16 MED ORDER — LIDOCAINE 2% (20 MG/ML) 5 ML SYRINGE
INTRAMUSCULAR | Status: AC
  Filled 2018-05-16: qty 5

## 2018-05-16 MED ORDER — SODIUM CHLORIDE 0.9 % IV BOLUS
500.0000 mL | Freq: Once | INTRAVENOUS | Status: AC
  Administered 2018-05-16: 500 mL via INTRAVENOUS

## 2018-05-16 MED ORDER — PHENYLEPHRINE HCL 10 MG/ML IJ SOLN
INTRAMUSCULAR | Status: AC
  Filled 2018-05-16: qty 2

## 2018-05-16 MED ORDER — PNEUMOCOCCAL VAC POLYVALENT 25 MCG/0.5ML IJ INJ
0.5000 mL | INJECTION | INTRAMUSCULAR | Status: DC
  Filled 2018-05-16: qty 0.5

## 2018-05-16 MED ORDER — SODIUM CHLORIDE 0.9 % IV SOLN
INTRAVENOUS | Status: DC
  Administered 2018-05-16 – 2018-05-20 (×5): via INTRAVENOUS

## 2018-05-16 MED ORDER — OXYCODONE HCL 5 MG PO TABS
5.0000 mg | ORAL_TABLET | ORAL | Status: DC | PRN
  Administered 2018-05-16 – 2018-05-20 (×8): 5 mg via ORAL
  Filled 2018-05-16 (×8): qty 1

## 2018-05-16 MED ORDER — SUGAMMADEX SODIUM 200 MG/2ML IV SOLN
INTRAVENOUS | Status: DC | PRN
  Administered 2018-05-16: 400 mg via INTRAVENOUS

## 2018-05-16 MED ORDER — PROPOFOL 10 MG/ML IV BOLUS
INTRAVENOUS | Status: AC
  Filled 2018-05-16: qty 20

## 2018-05-16 MED ORDER — EPHEDRINE 5 MG/ML INJ
INTRAVENOUS | Status: AC
  Filled 2018-05-16: qty 10

## 2018-05-16 MED ORDER — BUPIVACAINE LIPOSOME 1.3 % IJ SUSP
20.0000 mL | Freq: Once | INTRAMUSCULAR | Status: AC
  Administered 2018-05-16: 20 mL
  Filled 2018-05-16: qty 20

## 2018-05-16 MED ORDER — EPHEDRINE SULFATE-NACL 50-0.9 MG/10ML-% IV SOSY
PREFILLED_SYRINGE | INTRAVENOUS | Status: DC | PRN
  Administered 2018-05-16 (×4): 10 mg via INTRAVENOUS

## 2018-05-16 MED ORDER — DEXAMETHASONE SODIUM PHOSPHATE 10 MG/ML IJ SOLN
INTRAMUSCULAR | Status: AC
  Filled 2018-05-16: qty 1

## 2018-05-16 MED ORDER — HYDROMORPHONE HCL 1 MG/ML IJ SOLN
0.2500 mg | INTRAMUSCULAR | Status: DC | PRN
  Administered 2018-05-16 (×3): 0.25 mg via INTRAVENOUS
  Administered 2018-05-16: 0.5 mg via INTRAVENOUS
  Administered 2018-05-16 (×3): 0.25 mg via INTRAVENOUS

## 2018-05-16 MED ORDER — MORPHINE SULFATE (PF) 2 MG/ML IV SOLN
2.0000 mg | INTRAVENOUS | Status: DC | PRN
  Administered 2018-05-16: 2 mg via INTRAVENOUS
  Administered 2018-05-16 (×2): 4 mg via INTRAVENOUS
  Administered 2018-05-17 (×4): 2 mg via INTRAVENOUS
  Administered 2018-05-18 – 2018-05-19 (×3): 4 mg via INTRAVENOUS
  Filled 2018-05-16 (×3): qty 1
  Filled 2018-05-16 (×2): qty 2
  Filled 2018-05-16: qty 1
  Filled 2018-05-16 (×2): qty 2
  Filled 2018-05-16 (×3): qty 1

## 2018-05-16 MED ORDER — ONDANSETRON HCL 4 MG/2ML IJ SOLN
INTRAMUSCULAR | Status: DC | PRN
  Administered 2018-05-16: 4 mg via INTRAVENOUS

## 2018-05-16 MED ORDER — MIDAZOLAM HCL 2 MG/2ML IJ SOLN
INTRAMUSCULAR | Status: AC
  Filled 2018-05-16: qty 2

## 2018-05-16 MED ORDER — ALBUMIN HUMAN 5 % IV SOLN
INTRAVENOUS | Status: DC | PRN
  Administered 2018-05-16: 10:00:00 via INTRAVENOUS

## 2018-05-16 MED ORDER — MIDAZOLAM HCL 5 MG/5ML IJ SOLN
INTRAMUSCULAR | Status: DC | PRN
  Administered 2018-05-16: 2 mg via INTRAVENOUS

## 2018-05-16 MED ORDER — SUCCINYLCHOLINE CHLORIDE 200 MG/10ML IV SOSY
PREFILLED_SYRINGE | INTRAVENOUS | Status: AC
  Filled 2018-05-16: qty 10

## 2018-05-16 MED ORDER — INFLUENZA VAC SPLIT QUAD 0.5 ML IM SUSY
0.5000 mL | PREFILLED_SYRINGE | INTRAMUSCULAR | Status: AC
  Administered 2018-05-17: 0.5 mL via INTRAMUSCULAR
  Filled 2018-05-16 (×2): qty 0.5

## 2018-05-16 MED ORDER — ROCURONIUM BROMIDE 50 MG/5ML IV SOSY
PREFILLED_SYRINGE | INTRAVENOUS | Status: DC | PRN
  Administered 2018-05-16: 20 mg via INTRAVENOUS
  Administered 2018-05-16: 80 mg via INTRAVENOUS
  Administered 2018-05-16: 20 mg via INTRAVENOUS

## 2018-05-16 MED ORDER — ROCURONIUM BROMIDE 10 MG/ML (PF) SYRINGE
PREFILLED_SYRINGE | INTRAVENOUS | Status: AC
  Filled 2018-05-16: qty 10

## 2018-05-16 MED ORDER — FENTANYL CITRATE (PF) 100 MCG/2ML IJ SOLN
INTRAMUSCULAR | Status: DC | PRN
  Administered 2018-05-16 (×5): 50 ug via INTRAVENOUS

## 2018-05-16 MED ORDER — SUGAMMADEX SODIUM 500 MG/5ML IV SOLN
INTRAVENOUS | Status: AC
  Filled 2018-05-16: qty 5

## 2018-05-16 MED ORDER — MEPERIDINE HCL 50 MG/ML IJ SOLN: 6.2500 mg | INTRAMUSCULAR | Status: DC | PRN

## 2018-05-16 MED ORDER — VANCOMYCIN HCL 10 G IV SOLR
1500.0000 mg | Freq: Once | INTRAVENOUS | Status: AC
  Administered 2018-05-16: 1500 mg via INTRAVENOUS
  Filled 2018-05-16: qty 500

## 2018-05-16 MED ORDER — PHENYLEPHRINE 40 MCG/ML (10ML) SYRINGE FOR IV PUSH (FOR BLOOD PRESSURE SUPPORT)
PREFILLED_SYRINGE | INTRAVENOUS | Status: DC | PRN
  Administered 2018-05-16 (×2): 80 ug via INTRAVENOUS

## 2018-05-16 MED ORDER — ONDANSETRON HCL 4 MG/2ML IJ SOLN
INTRAMUSCULAR | Status: AC
  Filled 2018-05-16: qty 2

## 2018-05-16 MED ORDER — ACETAMINOPHEN 10 MG/ML IV SOLN
1000.0000 mg | Freq: Four times a day (QID) | INTRAVENOUS | Status: AC
  Administered 2018-05-16 – 2018-05-17 (×4): 1000 mg via INTRAVENOUS
  Filled 2018-05-16 (×3): qty 100

## 2018-05-16 MED ORDER — MIDAZOLAM HCL 2 MG/2ML IJ SOLN: 0.5000 mg | Freq: Once | INTRAMUSCULAR | Status: DC | PRN

## 2018-05-16 MED ORDER — DEXAMETHASONE SODIUM PHOSPHATE 10 MG/ML IJ SOLN
INTRAMUSCULAR | Status: DC | PRN
  Administered 2018-05-16: 10 mg via INTRAVENOUS

## 2018-05-16 MED ORDER — ACETAMINOPHEN 10 MG/ML IV SOLN
INTRAVENOUS | Status: AC
  Filled 2018-05-16: qty 100

## 2018-05-16 MED ORDER — LACTATED RINGERS IV SOLN: INTRAVENOUS | Status: DC

## 2018-05-16 MED ORDER — PROPOFOL 10 MG/ML IV BOLUS
INTRAVENOUS | Status: DC | PRN
  Administered 2018-05-16: 200 mg via INTRAVENOUS

## 2018-05-16 MED ORDER — ONDANSETRON HCL 4 MG/2ML IJ SOLN
4.0000 mg | INTRAMUSCULAR | Status: DC | PRN
  Administered 2018-05-17 – 2018-05-18 (×2): 4 mg via INTRAVENOUS
  Filled 2018-05-16 (×3): qty 2

## 2018-05-16 MED ORDER — PANTOPRAZOLE SODIUM 40 MG PO TBEC
80.0000 mg | DELAYED_RELEASE_TABLET | Freq: Every evening | ORAL | Status: DC
  Administered 2018-05-16 – 2018-05-20 (×5): 80 mg via ORAL
  Filled 2018-05-16 (×5): qty 2

## 2018-05-16 MED ORDER — TAMSULOSIN HCL 0.4 MG PO CAPS
0.4000 mg | ORAL_CAPSULE | Freq: Every evening | ORAL | Status: DC
  Administered 2018-05-16 – 2018-05-20 (×5): 0.4 mg via ORAL
  Filled 2018-05-16 (×5): qty 1

## 2018-05-16 MED ORDER — PROMETHAZINE HCL 25 MG/ML IJ SOLN: 6.2500 mg | INTRAMUSCULAR | Status: DC | PRN

## 2018-05-16 MED ORDER — FENTANYL CITRATE (PF) 250 MCG/5ML IJ SOLN
INTRAMUSCULAR | Status: AC
  Filled 2018-05-16: qty 5

## 2018-05-16 SURGICAL SUPPLY — 74 items
APPLICATOR SURGIFLO ENDO (HEMOSTASIS) ×3 IMPLANT
BAG LAPAROSCOPIC 12 15 PORT 16 (BASKET) IMPLANT
BAG RETRIEVAL 12/15 (BASKET)
BAG RETRIEVAL 12/15MM (BASKET)
BAG ZIPLOCK 12X15 (MISCELLANEOUS) ×3 IMPLANT
BLADE EXTENDED COATED 6.5IN (ELECTRODE) IMPLANT
BLADE SURG SZ10 CARB STEEL (BLADE) ×3 IMPLANT
CABLE HIGH FREQUENCY MONO STRZ (ELECTRODE) ×3 IMPLANT
CHLORAPREP W/TINT 26ML (MISCELLANEOUS) ×3 IMPLANT
CLEANER TIP ELECTROSURG 2X2 (MISCELLANEOUS) IMPLANT
CLIP VESOLOCK LG 6/CT PURPLE (CLIP) ×3 IMPLANT
CLIP VESOLOCK MED LG 6/CT (CLIP) ×3 IMPLANT
CLIP VESOLOCK XL 6/CT (CLIP) ×3 IMPLANT
CONNECTOR 5 IN 1 STRAIGHT STRL (MISCELLANEOUS) ×3 IMPLANT
COVER SURGICAL LIGHT HANDLE (MISCELLANEOUS) ×3 IMPLANT
CUTTER FLEX LINEAR 45M (STAPLE) ×6 IMPLANT
DERMABOND ADVANCED (GAUZE/BANDAGES/DRESSINGS) ×2
DERMABOND ADVANCED .7 DNX12 (GAUZE/BANDAGES/DRESSINGS) ×1 IMPLANT
DRAIN CHANNEL 10F 3/8 F FF (DRAIN) IMPLANT
DRAPE INCISE IOBAN 66X45 STRL (DRAPES) ×3 IMPLANT
DRSG TEGADERM 4X4.75 (GAUZE/BANDAGES/DRESSINGS) IMPLANT
ELECT PENCIL ROCKER SW 15FT (MISCELLANEOUS) ×3 IMPLANT
ELECT REM PT RETURN 15FT ADLT (MISCELLANEOUS) ×3 IMPLANT
EVACUATOR SILICONE 100CC (DRAIN) IMPLANT
GLOVE BIO SURGEON STRL SZ 6.5 (GLOVE) IMPLANT
GLOVE BIO SURGEON STRL SZ7 (GLOVE) ×3 IMPLANT
GLOVE BIO SURGEON STRL SZ7.5 (GLOVE) ×6 IMPLANT
GLOVE BIO SURGEONS STRL SZ 6.5 (GLOVE)
GLOVE BIOGEL PI IND STRL 7.0 (GLOVE) ×1 IMPLANT
GLOVE BIOGEL PI INDICATOR 7.0 (GLOVE) ×2
GOWN STRL REUS W/TWL LRG LVL3 (GOWN DISPOSABLE) ×3 IMPLANT
GOWN STRL REUS W/TWL XL LVL3 (GOWN DISPOSABLE) ×3 IMPLANT
HEMOSTAT SURGICEL 4X8 (HEMOSTASIS) ×3 IMPLANT
HOLDER FOLEY CATH W/STRAP (MISCELLANEOUS) ×3 IMPLANT
IRRIG SUCT STRYKERFLOW 2 WTIP (MISCELLANEOUS) ×3
IRRIGATION SUCT STRKRFLW 2 WTP (MISCELLANEOUS) ×1 IMPLANT
IV LACTATED RINGERS 1000ML (IV SOLUTION) ×3 IMPLANT
KIT BASIN OR (CUSTOM PROCEDURE TRAY) ×3 IMPLANT
LIGASURE VESSEL 5MM BLUNT TIP (ELECTROSURGICAL) ×3 IMPLANT
MARKER SKIN DUAL TIP RULER LAB (MISCELLANEOUS) ×3 IMPLANT
NS IRRIG 1000ML POUR BTL (IV SOLUTION) ×3 IMPLANT
PAD POSITIONING PINK XL (MISCELLANEOUS) ×3 IMPLANT
PENCIL SMOKE EVACUATOR (MISCELLANEOUS) ×3 IMPLANT
POSITIONER SURGICAL ARM (MISCELLANEOUS) ×9 IMPLANT
POUCH SPECIMEN RETRIEVAL 10MM (ENDOMECHANICALS) IMPLANT
RELOAD 45 VASCULAR/THIN (ENDOMECHANICALS) ×12 IMPLANT
SCISSORS LAP 5X35 DISP (ENDOMECHANICALS) IMPLANT
SPONGE LAP 18X18 5 PK (GAUZE/BANDAGES/DRESSINGS) IMPLANT
SPONGE LAP 18X18 RF (DISPOSABLE) ×3 IMPLANT
STAPLER VISISTAT 35W (STAPLE) ×3 IMPLANT
SURGIFLO W/THROMBIN 8M KIT (HEMOSTASIS) ×3 IMPLANT
SUT CHROMIC 2 0 SH (SUTURE) IMPLANT
SUT ETHILON 3 0 PS 1 (SUTURE) IMPLANT
SUT MNCRL AB 4-0 PS2 18 (SUTURE) ×6 IMPLANT
SUT PDS AB 0 CTX 60 (SUTURE) IMPLANT
SUT PDS AB 1 CT1 27 (SUTURE) ×6 IMPLANT
SUT PDS AB 1 CTX 36 (SUTURE) IMPLANT
SUT PDS AB 1 TP1 96 (SUTURE) ×6 IMPLANT
SUT VIC AB 2-0 SH 27 (SUTURE)
SUT VIC AB 2-0 SH 27X BRD (SUTURE) IMPLANT
SUT VICRYL 0 UR6 27IN ABS (SUTURE) ×3 IMPLANT
SYS LAPSCP GELPORT 120MM (MISCELLANEOUS) ×3
SYSTEM LAPSCP GELPORT 120MM (MISCELLANEOUS) ×1 IMPLANT
TOWEL OR 17X26 10 PK STRL BLUE (TOWEL DISPOSABLE) ×3 IMPLANT
TRAY FOLEY MTR SLVR 16FR STAT (SET/KITS/TRAYS/PACK) ×3 IMPLANT
TRAY LAPAROSCOPIC (CUSTOM PROCEDURE TRAY) ×3 IMPLANT
TROCAR BLADELESS OPT 5 100 (ENDOMECHANICALS) ×3 IMPLANT
TROCAR UNIVERSAL OPT 12M 100M (ENDOMECHANICALS) ×3 IMPLANT
TROCAR XCEL 12X100 BLDLESS (ENDOMECHANICALS) ×3 IMPLANT
TROCAR XCEL BLUNT TIP 100MML (ENDOMECHANICALS) ×3 IMPLANT
TUBING CONNECTING 10 (TUBING) ×2 IMPLANT
TUBING CONNECTING 10' (TUBING) ×1
TUBING INSUF HEATED (TUBING) ×3 IMPLANT
YANKAUER SUCT BULB TIP 10FT TU (MISCELLANEOUS) ×3 IMPLANT

## 2018-05-16 NOTE — Anesthesia Postprocedure Evaluation (Signed)
Anesthesia Post Note  Patient: George Lewis.  Procedure(s) Performed: HAND ASSISTED LAPAROSCOPIC RIGHT NEPHRECTOMY (Right )     Patient location during evaluation: PACU Anesthesia Type: General Level of consciousness: awake and alert Pain management: pain level controlled Vital Signs Assessment: post-procedure vital signs reviewed and stable Respiratory status: spontaneous breathing, nonlabored ventilation, respiratory function stable and patient connected to nasal cannula oxygen Cardiovascular status: blood pressure returned to baseline and stable (BP responsive to fluid bolus) Postop Assessment: no apparent nausea or vomiting Anesthetic complications: no    Last Vitals:  Vitals:   05/16/18 1245 05/16/18 1300  BP: (!) 86/53 (!) 100/51  Pulse: (!) 108   Resp: (!) 24   Temp:    SpO2: 95%     Last Pain:  Vitals:   05/16/18 1245  TempSrc:   PainSc: Asleep                 Rowyn,E. Kerstie Agent

## 2018-05-16 NOTE — Transfer of Care (Signed)
Immediate Anesthesia Transfer of Care Note  Patient: George Lewis.  Procedure(s) Performed: HAND ASSISTED LAPAROSCOPIC RIGHT NEPHRECTOMY (Right )  Patient Location: PACU  Anesthesia Type:General  Level of Consciousness: awake, alert  and oriented  Airway & Oxygen Therapy: Patient Spontanous Breathing and Patient connected to face mask oxygen  Post-op Assessment: Report given to RN and Post -op Vital signs reviewed and stable  Post vital signs: Reviewed and stable  Last Vitals:  Vitals Value Taken Time  BP 122/61 05/16/2018 11:17 AM  Temp    Pulse 112 05/16/2018 11:22 AM  Resp 20 05/16/2018 11:22 AM  SpO2 100 % 05/16/2018 11:22 AM  Vitals shown include unvalidated device data.  Last Pain:  Vitals:   05/16/18 0704  TempSrc:   PainSc: 0-No pain      Patients Stated Pain Goal: 4 (68/15/94 7076)  Complications: No apparent anesthesia complications

## 2018-05-16 NOTE — Anesthesia Procedure Notes (Signed)
Procedure Name: Intubation Date/Time: 05/16/2018 8:48 AM Performed by: Maxwell Caul, CRNA Pre-anesthesia Checklist: Patient identified, Emergency Drugs available, Suction available and Patient being monitored Patient Re-evaluated:Patient Re-evaluated prior to induction Oxygen Delivery Method: Circle system utilized Preoxygenation: Pre-oxygenation with 100% oxygen Induction Type: IV induction Ventilation: Mask ventilation without difficulty, Oral airway inserted - appropriate to patient size and Two handed mask ventilation required Laryngoscope Size: Glidescope and 4 Grade View: Grade I Tube type: Oral Tube size: 7.5 mm Number of attempts: 1 Airway Equipment and Method: Stylet and Oral airway Placement Confirmation: ETT inserted through vocal cords under direct vision,  positive ETCO2 and breath sounds checked- equal and bilateral Secured at: 22 cm Tube secured with: Tape Dental Injury: Teeth and Oropharynx as per pre-operative assessment  Comments: Elective Glidescope intubation based on pre-op assessment. DL X 1 with Glidescope 4. Grade 1 view, ETT 7.5 gently placed. ETCO2+, BBS=.

## 2018-05-16 NOTE — Op Note (Signed)
Operative Note  Preoperative diagnosis:  1.  Right renal mass  Postoperative diagnosis: 1.  Right renal mass  Procedure(s): 1.  Right hand assisted laparoscopic radical nephrectomy  Surgeon: Link Snuffer, MD  Assistants: Basilio Cairo, MD, resident an assistant was needed throughout the case further expertise in assisting with a laparoscopic surgery, including visualization with the camera, passing instruments, etc.  Anesthesia: General  Complications: None  EBL: 200 cc  Specimens: 1.  Right kidney  Drains/Catheters: 1. Foley catheter  Intraoperative findings: Right kidney removed entirely  Indication: 62 year old male who was found on imaging to have a renal mass concerning for renal cell carcinoma.  He subsequently underwent a interventional radiology guided biopsy that confirmed renal cell carcinoma.  He also had a pancreatic mass that underwent endoscopic biopsy.  This revealed neuroendocrine tumor.  The decision was made just to watch this.  After discussion of different options, the patient elected to undergo the above operation.  He understands the potential for end-stage renal disease requiring dialysis.  This is especially considering his preoperative GFR of just below 27.  Description of procedure:  The patient was identified and consent was obtained.  The patient was taken to the operating room and placed in the supine position.  The patient was placed under general anesthesia.  Perioperative antibiotics were administered.  The patient was placed in right lateral position at approximately 65 degrees and all pressure points were padded.  Patient was prepped and draped in a standard sterile fashion and a timeout was performed.  An 8 cm periumbilical incision was made sharply into the skin.  This was carried down with Bovie electrocautery down to the anterior rectus sheath which was divided with electrocautery.  The underlying musculature was separated in the midline.   Sharp dissection with Metzenbaum scissors was used to open up the posterior sheath and peritoneum.  This was extended with electrocautery taking great care not to use cautery near the bowel.  The hand assist port was secured into the incision.  I made sure no bowel was trapped within this.  A 12 mm port was inserted through the hand assist port and the abdomen was insufflated to a pressure of 15.  A 12 mm port was placed lateral as well as superior to the hand assist port, each 1 about a hand width away. A 5 mm port was placed superior to the midline port and used for liver retraction.  Please note that all ports were placed under direct visualization with the camera.  The colon was first dissected medially by incising along the white line of Toldt.  After medializing the colon, the kidney was dissected laterally and medially as well as superiorly.  Inferior attachments as well as the ureter and gonadal vein were divided with LigaSure device. I continued to carefully dissect medially and identified the renal hilum.  The renal vein and renal artery were divided with a 45 mm vascular staple load.  Superior attachments were then released using LigaSure device as well as blunt dissection.  Once the entire kidney and surrounding Gerota's fascia was freed, the specimen was withdrawn from the midline incision and passed off for permanent specimen.  The abdomen was reinspected and no active bleeding was noted.  Surgicel and FloSeal was applied to the nephrectomy bed.  The midline fascia was closed with running 0 looped PDS suture followed by staples.  The port incisions were closed with a 2-0 Vicryl followed by staples.  Exparel was instilled for anesthetic effect.  A dressing was applied.  Patient tolerated the procedure well and was stable postoperatively.  Plan: Stat labs will be obtained.  I have already spoken with nephrology who will come see the patient this afternoon.  If necessary, we can ask interventional  radiology to place a temporary dialysis line since his fistula is not yet mature.  Anticipate the patient will be in the hospital 1-2 nights at least as long as he does well.  He may require longer hospitalization given his comorbidities.

## 2018-05-16 NOTE — Consult Note (Signed)
Renal Service Consult Note [name]  George Lewis. 05/16/2018 George Lewis Requesting Physician:  Dr George Lewis, E.   Reason for Consult:  CKD IV patient sp nephrectomy HPI: The patient is a 62 y.o. year-old with hx of HTN, kidney stones, CKD IV underwent R nephrectomy today for renal mass/ suspected RCC.  Had L AVF placed 1 wk ago.  F/B Dr George Lewis at [facility], baseline creat is 2.7.    Patient is postop now, sig pain , no SOB or CP,  No abd pain , n/v/d.    ROS  denies CP  no joint pain   no HA  no blurry vision  no rash  no diarrhea  no nausea/ vomiting  no dysuria  no difficulty voiding  no change in urine color    Past Medical History  Past Medical History:  Diagnosis Date  . Arthritis    knees  . Bilateral renal cysts 03/23/2018   Noted MRI ABD  . Cholelithiasis 03/19/2018   noted on CT AB/Pelvis  . CKD (chronic kidney disease), stage IV [facility]    nephrologist-- dr George Lewis [facility]);  05-01-2018 has not started dialysis, scheduled for AV fistula creation 05-07-2018  . Diverticulosis of colon 04/19/2018   Noted on CT abd/pelvis  . GERD (gastroesophageal reflux disease)   . Hepatic steatosis 03/19/2018   Mild diffuse, noted on CT AB/Pelvis  . History of kidney stones   . History of pulmonary embolus (PE) 03/2008   treated with coumadin for 6 months  . History of sepsis 04/20/2018   per discharge note , probable UTI  . Hypertension   . Nocturia   . OSA on CPAP   . Pancreatic lesion    0.4cm cystic per CT 07/ 2019  . Pneumonia   . Pulmonary nodule    Solid 23m Right Lower Lobe  . Right renal mass 04/10/2018   new dx--  scheduled for nephrectomy 05-16-2018  . S/P ureteral stent placement: 04/16/2018 04/19/2018   Past Surgical History  Past Surgical History:  Procedure Laterality Date  . AV FISTULA PLACEMENT Left 05/07/2018   Procedure: Creation of Left arm Radiocephalic Fistula;  Surgeon: CMarty Heck MD;  [place]   Service: Vascular;  Laterality: Left;  . CYSTOSCOPY/RETROGRADE/URETEROSCOPY/STONE EXTRACTION WITH BASKET  x2 last one 1990s approx.  . CYSTOSCOPY/URETEROSCOPY/HOLMIUM LASER/STENT PLACEMENT Left 04/16/2018   Procedure: CYSTOSCOPY/LEFT URETEROSCOPY/LEFT RETROGRADE/STENT PLACEMENT;  Surgeon: BLucas Mallow MD;  Location: [facility];  Service: Urology;  Laterality: Left;  . EUS N/A 05/03/2018   Procedure: UPPER ENDOSCOPIC ULTRASOUND (EUS) RADIAL;  Surgeon: George Banister MD;  Location: [facility];  Service: Gastroenterology;  Laterality: N/A;  . EUS N/A 05/03/2018   Procedure: UPPER ENDOSCOPIC ULTRASOUND (EUS) LINEAR;  Surgeon: George Banister MD;  Location: [facility];  Service: Gastroenterology;  Laterality: N/A;  . EXTRACORPOREAL SHOCK WAVE LITHOTRIPSY  x3  last one 2004 approx.  .Marland KitchenFINE NEEDLE ASPIRATION N/A 05/03/2018   Procedure: FINE NEEDLE ASPIRATION (FNA) LINEAR;  Surgeon: George Banister MD;  Location: [facility];  Service: Gastroenterology;  Laterality: N/A;  . left index finger attachment  1980s   3-4 surgeries  . TOE SURGERY  1980s   in beween 2nd and 3rd toes cyst removed   Family History  Family History  Problem Relation Age of Onset  . Alzheimer's disease Mother   . Alzheimer's disease Father   . Heart disease Father    Social History  reports that  George Lewis has never smoked. George Lewis has never used smokeless tobacco. George Lewis reports that George Lewis does not drink alcohol or use drugs. Allergies  Allergies  Allergen Reactions  . Penicillins Rash and Other (See Comments)    Has patient had a PCN reaction causing immediate rash, facial/tongue/throat swelling, SOB or lightheadedness with hypotension: yes Has patient had a PCN reaction causing severe rash involving mucus membranes or skin necrosis: no Has patient had a PCN reaction that required hospitalization: no Has patient had a PCN reaction occurring within the last 10 years: no If all of the above answers are "NO", then may proceed with  Cephalosporin use.    Home medications Prior to Admission medications   Medication Sig Start Date End Date Taking? Authorizing Provider  acetaminophen (TYLENOL) 500 MG tablet Take 500-1,000 mg by mouth every 8 (eight) hours as needed for mild pain.    Yes [provider]  allopurinol (ZYLOPRIM) 300 MG tablet Take 300 mg by mouth every evening.  03/08/18  Yes [provider]  diphenhydramine-acetaminophen (TYLENOL PM) 25-500 MG TABS tablet Take 1 tablet by mouth at bedtime as needed (sleep).   Yes [provider]  fluticasone (FLONASE) 50 MCG/ACT nasal spray Place 2 sprays into both nostrils daily as needed for allergies or rhinitis.   Yes [provider]  HYDROcodone-acetaminophen (NORCO/VICODIN) 5-325 MG tablet Take 1 tablet by mouth every 4 (four) hours as needed for moderate pain. 04/16/18 04/16/19 Yes George Redwood III, MD  loratadine (CLARITIN) 10 MG tablet Take 10 mg by mouth every evening.    Yes [provider]  metoprolol tartrate (LOPRESSOR) 25 MG tablet Take 1 tablet (25 mg total) by mouth 2 (two) times daily. 04/23/18  Yes George Filler, MD  omeprazole (PRILOSEC OTC) 20 MG tablet Take 40 mg by mouth every evening.    Yes [provider]  oxyCODONE-acetaminophen (PERCOCET) 5-325 MG tablet Take 1 tablet by mouth every 6 (six) hours as needed for moderate pain. 05/07/18 05/07/19 Yes George Heck, MD  Potassium Citrate 15 MEQ (1620 MG) TBCR Take 1 tablet by mouth every evening.  03/22/18  Yes [provider]  tamsulosin (FLOMAX) 0.4 MG CAPS capsule Take 0.4 mg by mouth every evening.  03/08/18  Yes [provider]   Liver Function Tests No results for input(s): AST, ALT, ALKPHOS, BILITOT, PROT, ALBUMIN in the last 168 hours. No results for input(s): LIPASE, AMYLASE in the last 168 hours. CBC Recent Labs  Lab 05/16/18 1139  HGB 10.9*  HCT 76.8*   Basic Metabolic Panel Recent Labs  Lab 05/16/18 1139  NA  140  K 4.7  CL 107  CO2 21*  GLUCOSE 200*  BUN 29*  CREATININE 2.62*  CALCIUM 8.0*   Iron/TIBC/Ferritin/ %Sat    Component Value Date/Time   IRON 19 (L) 04/20/2018 0339   TIBC 260 04/20/2018 0339   FERRITIN 274 04/20/2018 0339   IRONPCTSAT 7 (L) 04/20/2018 0339    Vitals:   05/16/18 1245 05/16/18 1300 05/16/18 1315 05/16/18 1330  BP: (!) 86/53 (!) 100/51 (!) 96/54 (!) 94/53  Pulse: (!) 108 (!) 107 (!) 108 (!) 108  Resp: (!) 24 (!) 25 (!) 21 (!) 21  Temp:    98 F (36.7 C)  TempSrc:      SpO2: 95% 96% 95% 95%  Weight:      Height:       Exam Gen obese, pleasant, nasal O2, no distress No rash, cyanosis or gangrene Sclera  anicteric, throat clear  No jvd or bruits Chest clear bilat , dec'd at bases RRR no MRG Abd soft ntnd no mass or ascites +bs obese , midline dressing GU normal male w foley MS no joint effusions or deformity Ext trace LE edema, no wounds or ulcers Neuro is alert, Ox 3 , nf    Home meds:  - allopurinol 300 hs/ loratadine 10 hs/ omeprazole 40 qd/ tamsulosin 0.4 qd  - oxycodone -acetaminophen 5-325 qid prn/ hydrocodone - aceta prn  - metoprolol tartrate 25 bid/ potassium citrate 15 mEq qhs     Impression: 1. SP right hand-assisted lap nephrectomy 9/18- for R renal mass, suspected RCC by imaging 2. CKD IV - baseline creat 2.7, eGFR 27, has L forearm AVF recently placed.  Will have AKI post op, agree w/ IVF"s at 75/hr.   3. HTN - on BB only, will hold w/ soft BP's post op 4. Volume- no sig vol excess on exam, no diuretics at home   Plan - will follow, check creat in am, IVF"s, hold BP lowering meds  Kelly Splinter MD [place] pager [phone]   05/16/2018, 2:24 PM

## 2018-05-16 NOTE — Treatment Plan (Signed)
Post op check  HR 108, BP 113/58 Patient awake and doing well in stepdown unit Family at bedside Expected R flank pain, morphine 2 mg just now hgb 10.9 from 11.6 Cr 2.62, near baseline, ~200cc urine in bag. Nephrology following along  Patient may have clears tonight. Encourage OOBTC if able, ambulation if able

## 2018-05-16 NOTE — Interval H&P Note (Signed)
History and Physical Interval Note:  In the interval, the patient underwent a pancreatic mass biopsy.  This revealed neuroendocrine tumor.  The collective decision was made to observe this and proceed with nephrectomy.  He is feeling much improved after completing antibiotics after his ureteroscopy was complicated by bacteremia.  05/16/2018 8:18 AM  George Lewis.  has presented today for surgery, with the diagnosis of RIGHT RENAL MASS  The various methods of treatment have been discussed with the patient and family. After consideration of risks, benefits and other options for treatment, the patient has consented to  Procedure(s): HAND ASSISTED LAPAROSCOPIC RIGHT NEPHRECTOMY POSSIBLE OPEN RIGHT RADICAL NEPHRECTOMY (Right) as a surgical intervention .  The patient's history has been reviewed, patient examined, no change in status, stable for surgery.  I have reviewed the patient's chart and labs.  Questions were answered to the patient's satisfaction.     Marton Redwood, III

## 2018-05-17 ENCOUNTER — Encounter (HOSPITAL_COMMUNITY): Payer: Self-pay | Admitting: Urology

## 2018-05-17 LAB — BASIC METABOLIC PANEL
Anion gap: 11 (ref 5–15)
Anion gap: 15 (ref 5–15)
BUN: 37 mg/dL — ABNORMAL HIGH (ref 6–20)
BUN: 40 mg/dL — ABNORMAL HIGH (ref 6–20)
CALCIUM: 8.4 mg/dL — AB (ref 8.9–10.3)
CALCIUM: 8.6 mg/dL — AB (ref 8.9–10.3)
CO2: 20 mmol/L — AB (ref 22–32)
CO2: 14 mmol/L — ABNORMAL LOW (ref 22–32)
Chloride: 106 mmol/L (ref 98–111)
Chloride: 109 mmol/L (ref 98–111)
Creatinine, Ser: 3.12 mg/dL — ABNORMAL HIGH (ref 0.61–1.24)
Creatinine, Ser: 3.31 mg/dL — ABNORMAL HIGH (ref 0.61–1.24)
GFR, EST AFRICAN AMERICAN: 24 mL/min — AB (ref >60)
GFR, EST AFRICAN AMERICAN: 22 mL/min — AB (ref >60)
GFR, EST NON AFRICAN AMERICAN: 20 mL/min — AB (ref >60)
GFR, EST NON AFRICAN AMERICAN: 19 mL/min — AB (ref >60)
Glucose, Bld: 209 mg/dL — ABNORMAL HIGH (ref 70–99)
Glucose, Bld: 185 mg/dL — ABNORMAL HIGH (ref 70–99)
Potassium: 6.3 mmol/L (ref 3.5–5.1)
Potassium: 6.4 mmol/L (ref 3.5–5.1)
SODIUM: 137 mmol/L (ref 135–145)
SODIUM: 138 mmol/L (ref 135–145)

## 2018-05-17 LAB — CBC
HEMATOCRIT: 32.6 % — AB (ref 39.0–52.0)
Hemoglobin: 10.4 g/dL — ABNORMAL LOW (ref 13.0–17.0)
MCH: 28.3 pg (ref 26.0–34.0)
MCHC: 31.9 g/dL (ref 30.0–36.0)
MCV: 88.8 fL (ref 78.0–100.0)
PLATELETS: 235 10*3/uL (ref 150–400)
RBC: 3.67 MIL/uL — ABNORMAL LOW (ref 4.22–5.81)
RDW: 16.8 % — AB (ref 11.5–15.5)
WBC: 14.1 10*3/uL — AB (ref 4.0–10.5)

## 2018-05-17 LAB — POTASSIUM: POTASSIUM: 5.6 mmol/L — AB (ref 3.5–5.1)

## 2018-05-17 MED ORDER — DEXTROSE 50 % IV SOLN
1.0000 | Freq: Once | INTRAVENOUS | Status: AC
  Administered 2018-05-17: 50 mL via INTRAVENOUS
  Filled 2018-05-17: qty 50

## 2018-05-17 MED ORDER — SODIUM POLYSTYRENE SULFONATE 15 GM/60ML PO SUSP: 30.0000 g | Freq: Once | ORAL | Status: DC

## 2018-05-17 MED ORDER — SODIUM POLYSTYRENE SULFONATE 15 GM/60ML PO SUSP
30.0000 g | Freq: Once | ORAL | Status: AC
  Administered 2018-05-17: 30 g via ORAL
  Filled 2018-05-17: qty 120

## 2018-05-17 MED ORDER — INSULIN ASPART 100 UNIT/ML ~~LOC~~ SOLN
10.0000 [IU] | Freq: Once | SUBCUTANEOUS | Status: AC
  Administered 2018-05-17: 10 [IU] via SUBCUTANEOUS

## 2018-05-17 MED ORDER — FUROSEMIDE 10 MG/ML IJ SOLN
60.0000 mg | Freq: Once | INTRAMUSCULAR | Status: AC
  Administered 2018-05-17: 60 mg via INTRAVENOUS
  Filled 2018-05-17: qty 6

## 2018-05-17 MED ORDER — HEPARIN SODIUM (PORCINE) 5000 UNIT/ML IJ SOLN
5000.0000 [IU] | Freq: Three times a day (TID) | INTRAMUSCULAR | Status: DC
  Administered 2018-05-17 – 2018-05-21 (×12): 5000 [IU] via SUBCUTANEOUS
  Filled 2018-05-17 (×12): qty 1

## 2018-05-17 MED ORDER — SODIUM CHLORIDE 0.9 % IV SOLN
1.0000 g | Freq: Once | INTRAVENOUS | Status: AC
  Administered 2018-05-17: 1 g via INTRAVENOUS
  Filled 2018-05-17: qty 10

## 2018-05-17 MED ORDER — INSULIN ASPART 100 UNIT/ML IV SOLN
10.0000 [IU] | Freq: Once | INTRAVENOUS | Status: AC
  Administered 2018-05-17: 10 [IU] via INTRAVENOUS
  Filled 2018-05-17: qty 0.1

## 2018-05-17 MED ORDER — ORAL CARE MOUTH RINSE
15.0000 mL | Freq: Two times a day (BID) | OROMUCOSAL | Status: DC
  Administered 2018-05-17 – 2018-05-18 (×3): 15 mL via OROMUCOSAL

## 2018-05-17 NOTE — Progress Notes (Signed)
Urology Progress Note   1 Day Post-Op s/p Right HA radical nephrectomy  Subjective: NAEON. Pain fairly well controlled. Complains of diffuse abdominal soreness VSS overnight AM lab work showed hyperkalemia to 6.3, creatinine 3.12, hgb stable at 10.4  UOP was adequate at 1.8L  Objective: Vital signs in last 24 hours: Temp:  [97.9 F (36.6 C)-98.5 F (36.9 C)] 98.1 F (36.7 C) (09/19 0711) Pulse Rate:  [88-118] 95 (09/19 0730) Resp:  [21-37] 25 (09/19 0730) BP: (82-140)/(30-75) 114/51 (09/19 0700) SpO2:  [95 %-100 %] 95 % (09/19 0730)  Intake/Output from previous day: 09/18 0701 - 09/19 0700 In: 3551.4 [P.O.:750; I.V.:2220.8; IV Piggyback:580.6] Out: 1975 [Urine:1875; Blood:100] Intake/Output this shift: Total I/O In: 37.5 [I.V.:37.5] Out: -   Physical Exam:  General: Alert and oriented CV: RRR Lungs: Clear Abdomen: Soft, appropriately tender, diffuse. Some abdominal distension although difficult to appreciate given habitus. Incisions c/d/i stapled, covered with dry gauze.  GU: Foley in place draining clear yellow urine  Ext: NT, No erythema  Lab Results: Recent Labs    05/16/18 1139 05/17/18 0335  HGB 10.9* 10.4*  HCT 34.3* 32.6*   BMET Recent Labs    05/16/18 1139 05/17/18 0335  NA 140 137  K 4.7 6.3*  CL 107 106  CO2 21* 20*  GLUCOSE 200* 209*  BUN 29* 37*  CREATININE 2.62* 3.12*  CALCIUM 8.0* 8.4*     Studies/Results: No results found.  Assessment/Plan:  62 y.o. male s/p Right HA radical nephrectomy on 05/16/18.  Overall doing well post-op.  Hyperkalemia to 6.3 on am lab work, asymptomatic.   - repeat BMP this AM, +EKG. If persistent hyperkalemia will plan to treat medically - advance to renal diet, medlock if PO adequate - DC foley catheter and follow up TOV - OOBTC and ambulation today - daily lab work, following creatinine. Nephrology following as well   Dispo: transfer to floor   LOS: 1 day   George Lewis 05/17/2018, 8:04 AM

## 2018-05-17 NOTE — Progress Notes (Signed)
Transferred to room 1409 via wheelchair.

## 2018-05-17 NOTE — Progress Notes (Signed)
George Lewis Progress Note  Subjective: feeling better, pain better.  No BM, got kayexalate for K 6.4  Vitals:   05/17/18 0730 05/17/18 0800 05/17/18 1200 05/17/18 1234  BP:  (!) 122/58 (!) 116/52   Pulse: 95 88 96   Resp: (!) 25 (!) 30 (!) 23   Temp:    98 F (36.7 C)  TempSrc:    Oral  SpO2: 95% 97% 94%   Weight:      Height:        Inpatient medications: . allopurinol  300 mg Oral QPM  . heparin  5,000 Units Subcutaneous Q8H  . loratadine  10 mg Oral QPM  . mouth rinse  15 mL Mouth Rinse BID  . pantoprazole  80 mg Oral QPM  . pneumococcal 23 valent vaccine  0.5 mL Intramuscular Tomorrow-1000  . tamsulosin  0.4 mg Oral QPM   . sodium chloride 50 mL/hr at 05/17/18 0906   morphine injection, neomycin-bacitracin-polymyxin, ondansetron, oxyCODONE  Iron/TIBC/Ferritin/ %Sat    Component Value Date/Time   IRON 19 (L) 04/20/2018 0339   TIBC 260 04/20/2018 0339   FERRITIN 274 04/20/2018 0339   IRONPCTSAT 7 (L) 04/20/2018 0339    Home meds:  - allopurinol 300 hs/ loratadine 10 hs/ omeprazole 40 qd/ tamsulosin 0.4 qd  - oxycodone -acetaminophen 5-325 qid prn/ hydrocodone - aceta prn  - metoprolol tartrate 25 bid/ potassium citrate 15 mEq qhs   Exam: Gen obese, pleasant, nasal O2, no distress No jvd or bruits Chest clear bilat , dec'd at bases RRR no MRG Abd soft ntnd no mass or ascites,+ midline dressing GU normal male w foley out now Ext trace LE edema Neuro is alert, Ox 3 , nf   Impression: 1. SP right hand-assisted lap nephrectomy 9/18- for R renal mass, suspected RCC by imaging 2. AKI on CKD IV - baseline creat 2.7. Creat 3.2 today after R nephrectomy 9/18. Cont to observe, no indication for dialysis at this time. 3. HTN - holding home metoprolol 4. Volume - mild vol excess 5. Hyperkalemia - low K+ diet, sp 30 gm kayexalate and IV insulin/ glu, repeat due to 2pm. Will give lasix IV x 1. Other options include [place].    Plan - as  above   Kelly Splinter MD [facility] pager [phone]   05/17/2018, 1:33 PM   Recent Labs  Lab 05/17/18 0335 05/17/18 0837  NA 137 138  K 6.3* 6.4*  CL 106 109  CO2 20* 14*  GLUCOSE 209* 185*  BUN 37* 40*  CREATININE 3.12* 3.31*  CALCIUM 8.4* 8.6*   No results for input(s): AST, ALT, ALKPHOS, BILITOT, PROT in the last 168 hours. Recent Labs  Lab 05/16/18 1139 05/17/18 0335  WBC  --  14.1*  HGB 10.9* 10.4*  HCT 34.3* 32.6*  MCV  --  88.8  PLT  --  235

## 2018-05-17 NOTE — Progress Notes (Signed)
Urology PCP on call was notified for patient who had 5 beats of Ventricular Tachycardia and right lower abdominal pain.   PCP on call gave verbal orders to RN

## 2018-05-17 NOTE — Progress Notes (Signed)
Received patient form ICU, telemetry monitor applied, VS obtained, agree with previous RN assessment, oriented to unit and call light placed in reach

## 2018-05-17 NOTE — Evaluation (Signed)
Physical Therapy Evaluation Patient Details Name: George Lewis. MRN: [id number] DOB: 12-09-1955 Today's Date: 05/17/2018   History of Present Illness  62 y.o. male with medical history significant of recently diagnosed right renal cell carcinoma, CKD IV, hypertension, gastroesophageal reflux disease, obstructive sleep apnea on CPAP machine. Recent hospitalization for UTI and bacteremia. Pt s/p R hand assisted laparoscopic radical nephrectomy on 9/18. Pt recently with L dialysis port placement.  Clinical Impression   Pt s/p nephrectomy, and presents with decreased ability to perform bed mobility tasks, decreased knowledge of safe mobility with abdominal surgery, and abdominal pain all of which are limiting pt from progressing mobility at this time. Pt to benefit from acute PT to address deficits. PT to progress mobility to transfers, ambulation, and stair training as tolerated by pt in future sessions. PT eval limited by pain, will further assess mobility tomorrow or as schedule allows.     Follow Up Recommendations Home health PT;Supervision for mobility/OOB    Equipment Recommendations  None recommended by PT    Recommendations for Other Services       Precautions / Restrictions Precautions Precautions: Fall Precaution Comments: abdominal - log roll technique instuction  Restrictions Weight Bearing Restrictions: No      Mobility  Bed Mobility Overal bed mobility: Needs Assistance Bed Mobility: Sit to Supine       Sit to supine: Mod assist;HOB elevated   General bed mobility comments: Pt sitting EOB upon PT arrival to room. Pt instructed in log roll technique prior to moving sit to supine. Pt with mod assist for LE elevation, rolling. Pt with increased pain during bed mobility tasks, defers further PT at this time.   Transfers Overall transfer level: (Deferred due to high pain level )                  Ambulation/Gait Ambulation/Gait assistance: (deferred due  to high pain level )              Stairs            Wheelchair Mobility    Modified Rankin (Stroke Patients Only)       Balance Overall balance assessment: Needs assistance Sitting-balance support: No upper extremity supported;Feet supported Sitting balance-Leahy Scale: Good Sitting balance - Comments: sitting EOB upon PT arrival to room, able to scoot without LOB.      Standing balance-Leahy Scale: (unable to assess at this time )                               Pertinent Vitals/Pain Pain Assessment: 0-10 Pain Score: 5  Pain Location: abdomen  Pain Descriptors / Indicators: Aching;Sore Pain Intervention(s): Limited activity within patient's tolerance;Monitored during session    [place] expects to be discharged to:: Private residence Living Arrangements: Spouse/significant other Available Help at Discharge: Family;Available PRN/intermittently Type of Home: House Home Access: Stairs to enter Entrance Stairs-Rails: Right Entrance Stairs-Number of Steps: 3 Home Layout: One level Home Equipment: None      Prior Function Level of Independence: Independent         Comments: works as Theatre manager at [employer], 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      Hand Dominance   Dominant Hand: Right    Extremity/Trunk Assessment   Upper Extremity Assessment Upper Extremity Assessment: Defer to OT evaluation    Lower Extremity Assessment Lower Extremity Assessment: Generalized weakness(difficulty lifting legs into  bed when sitting EOB)    Cervical / Trunk Assessment Cervical / Trunk Assessment: Normal  Communication   Communication: No difficulties  Cognition Arousal/Alertness: Awake/alert Behavior During Therapy: WFL for tasks assessed/performed Overall Cognitive Status: Within Functional Limits for tasks assessed                                        General Comments General comments (skin  integrity, edema, etc.): Pt instructed in log rolling technique to perform bed mobility, both through verbalization and demonstration. Pt also educated on holding pillow against abdomen when coughing/sneezing as needed     Exercises     Assessment/Plan    PT Assessment Patient needs continued PT services  PT Problem List Decreased strength;Pain;Decreased activity tolerance;Decreased balance;Decreased safety awareness;Decreased mobility       PT Treatment Interventions Therapeutic activities;Gait training;Patient/family education;Therapeutic exercise;Stair training;Balance training;Functional mobility training    PT Goals (Current goals can be found in the Care Plan section)  Acute Rehab PT Goals PT Goal Formulation: With patient Time For Goal Achievement: 05/31/18 Potential to Achieve Goals: Good    Frequency Min 3X/week   Barriers to discharge        Co-evaluation               AM-PAC PT "6 Clicks" Daily Activity  Outcome Measure Difficulty turning over in bed (including adjusting bedclothes, sheets and blankets)?: Unable Difficulty moving from lying on back to sitting on the side of the bed? : Unable Difficulty sitting down on and standing up from a chair with arms (e.g., wheelchair, bedside commode, etc,.)?: Unable Help needed moving to and from a bed to chair (including a wheelchair)?: A Little Help needed walking in hospital room?: A Little Help needed climbing 3-5 steps with a railing? : A Lot 6 Click Score: 11    End of Session   Activity Tolerance: Patient limited by pain Patient left: in bed;with family/visitor present;with call bell/phone within reach Nurse Communication: Mobility status PT Visit Diagnosis: Muscle weakness (generalized) (M62.81);Pain Pain - Right/Left: Right Pain - part of body: (abdomen)    Time: 2633-3545 PT Time Calculation (min) (ACUTE ONLY): 13 min   Charges:   PT Evaluation $PT Eval Low Complexity: 1 Low         Luciann Gossett Conception Chancy, PT Acute Rehabilitation Services Pager [phone]  Office [phone]  Elody Kleinsasser D Elonda Husky 05/17/2018, 3:59 PM

## 2018-05-17 NOTE — Addendum Note (Signed)
Addendum  created 05/17/18 0645 by Lollie Sails, CRNA   Charge Capture section accepted

## 2018-05-17 NOTE — Progress Notes (Signed)
Dr Natividad Brood aware of K 6.4 . Meds given as ordered

## 2018-05-17 NOTE — Progress Notes (Signed)
CRITICAL VALUE ALERT  Critical Value: Potassium 6.3  Date & Time Notied:  05/17/2018 @ 0438  Provider Notified: Urologist on Call  Orders Received/Actions taken: awaiting response

## 2018-05-18 ENCOUNTER — Inpatient Hospital Stay (HOSPITAL_COMMUNITY): Payer: PRIVATE HEALTH INSURANCE

## 2018-05-18 DIAGNOSIS — I472 Ventricular tachycardia: Secondary | ICD-10-CM

## 2018-05-18 DIAGNOSIS — I5030 Unspecified diastolic (congestive) heart failure: Secondary | ICD-10-CM

## 2018-05-18 DIAGNOSIS — G4733 Obstructive sleep apnea (adult) (pediatric): Secondary | ICD-10-CM

## 2018-05-18 DIAGNOSIS — I10 Essential (primary) hypertension: Secondary | ICD-10-CM

## 2018-05-18 LAB — BASIC METABOLIC PANEL
ANION GAP: 13 (ref 5–15)
BUN: 46 mg/dL — AB (ref 6–20)
CO2: 21 mmol/L — AB (ref 22–32)
CREATININE: 3.66 mg/dL — AB (ref 0.61–1.24)
Calcium: 9.1 mg/dL (ref 8.9–10.3)
Chloride: 107 mmol/L (ref 98–111)
GFR calc non Af Amer: 17 mL/min — ABNORMAL LOW (ref >60)
GFR, EST AFRICAN AMERICAN: 19 mL/min — AB (ref >60)
Glucose, Bld: 128 mg/dL — ABNORMAL HIGH (ref 70–99)
Potassium: 5.5 mmol/L — ABNORMAL HIGH (ref 3.5–5.1)
Sodium: 141 mmol/L (ref 135–145)

## 2018-05-18 LAB — CBC
HEMATOCRIT: 34.7 % — AB (ref 39.0–52.0)
Hemoglobin: 11.3 g/dL — ABNORMAL LOW (ref 13.0–17.0)
MCH: 28.2 pg (ref 26.0–34.0)
MCHC: 32.6 g/dL (ref 30.0–36.0)
MCV: 86.5 fL (ref 78.0–100.0)
PLATELETS: 264 10*3/uL (ref 150–400)
RBC: 4.01 MIL/uL — ABNORMAL LOW (ref 4.22–5.81)
RDW: 17.2 % — AB (ref 11.5–15.5)
WBC: 13.5 10*3/uL — AB (ref 4.0–10.5)

## 2018-05-18 LAB — MAGNESIUM: MAGNESIUM: 1.6 mg/dL — AB (ref 1.7–2.4)

## 2018-05-18 LAB — ECHOCARDIOGRAM COMPLETE
Height: 73 in
Weight: 4976.01 oz

## 2018-05-18 MED ORDER — PERFLUTREN LIPID MICROSPHERE
1.0000 mL | INTRAVENOUS | Status: AC | PRN
  Administered 2018-05-18: 4 mL via INTRAVENOUS
  Filled 2018-05-18: qty 10

## 2018-05-18 MED ORDER — POLYETHYLENE GLYCOL 3350 17 G PO PACK
17.0000 g | PACK | Freq: Two times a day (BID) | ORAL | Status: DC
  Administered 2018-05-18 – 2018-05-20 (×4): 17 g via ORAL
  Filled 2018-05-18 (×5): qty 1

## 2018-05-18 MED ORDER — METOPROLOL TARTRATE 25 MG PO TABS
25.0000 mg | ORAL_TABLET | Freq: Two times a day (BID) | ORAL | Status: DC
  Administered 2018-05-18 – 2018-05-21 (×7): 25 mg via ORAL
  Filled 2018-05-18 (×7): qty 1

## 2018-05-18 MED ORDER — ACETAMINOPHEN 325 MG PO TABS
650.0000 mg | ORAL_TABLET | Freq: Four times a day (QID) | ORAL | Status: DC | PRN
  Administered 2018-05-18: 650 mg via ORAL
  Filled 2018-05-18: qty 2

## 2018-05-18 MED ORDER — ACETAMINOPHEN 10 MG/ML IV SOLN
1000.0000 mg | Freq: Four times a day (QID) | INTRAVENOUS | Status: AC
  Administered 2018-05-18 – 2018-05-19 (×4): 1000 mg via INTRAVENOUS
  Filled 2018-05-18 (×4): qty 100

## 2018-05-18 MED ORDER — SENNOSIDES-DOCUSATE SODIUM 8.6-50 MG PO TABS
2.0000 | ORAL_TABLET | Freq: Two times a day (BID) | ORAL | Status: DC
  Administered 2018-05-18 – 2018-05-20 (×5): 2 via ORAL
  Filled 2018-05-18 (×6): qty 2

## 2018-05-18 MED ORDER — SODIUM POLYSTYRENE SULFONATE 15 GM/60ML PO SUSP
15.0000 g | Freq: Once | ORAL | Status: AC
  Administered 2018-05-18: 15 g via ORAL
  Filled 2018-05-18: qty 60

## 2018-05-18 MED ORDER — MAGNESIUM SULFATE 2 GM/50ML IV SOLN
2.0000 g | Freq: Once | INTRAVENOUS | Status: AC
  Administered 2018-05-18: 2 g via INTRAVENOUS
  Filled 2018-05-18: qty 50

## 2018-05-18 MED ORDER — MILK AND MOLASSES ENEMA
1.0000 | Freq: Once | RECTAL | Status: AC
  Administered 2018-05-18: 250 mL via RECTAL
  Filled 2018-05-18: qty 250

## 2018-05-18 NOTE — Progress Notes (Signed)
Physical Therapy Treatment Patient Details Name: George Lewis. MRN: [id number] DOB: [birth year] Today's Date: 05/18/2018    History of Present Illness 62 y.o. male with medical history significant of recently diagnosed right renal cell carcinoma, CKD IV, hypertension, gastroesophageal reflux disease, obstructive sleep apnea on CPAP machine. Recent hospitalization for UTI and bacteremia. Pt s/p R hand assisted laparoscopic radical nephrectomy on 9/18. Pt recently with L dialysis port placement.    PT Comments    Pt with high HR this session, resting HR ranging from 114-120 at this time. Pt with limited tolerance for hallway ambulation today due to tachycardia(145-150) with ambulation and fatigue. PT terminated ambulation due to extreme tachycardia. Pt states during session he has never felt this weak in his life. Pt able to perform sitting LE exercises to promote LE strength. Will continue to follow acutely and progress mobility as able.    Follow Up Recommendations  Home health PT;Supervision for mobility/OOB     Equipment Recommendations  None recommended by PT    Recommendations for Other Services       Precautions / Restrictions Precautions Precautions: Fall Precaution Comments: abdominal - log roll technique  Restrictions Weight Bearing Restrictions: No    Mobility  Bed Mobility               General bed mobility comments: pt up in chair upon arrival to room   Transfers Overall transfer level: Needs assistance Equipment used: None Transfers: Sit to/from Stand Sit to Stand: Min guard         General transfer comment: Min guard for safety. Required verbal instruction for scooting to edge of chair and flexing trunk prior to putting the recliner leg rests down to avoid tension on abdomen. Pt HR up to 135 with just sitting with legs on ground in chair, rested until HR returned to low 120s. Increased time and effort to stand.   Ambulation/Gait Ambulation/Gait  assistance: Min guard;+2 safety/equipment(recliner follow for high HR and decreased activity tolerance) Gait Distance (Feet): 20 Feet Assistive device: None Gait Pattern/deviations: Step-to pattern;Decreased stride length;Trunk flexed;Wide base of support;Antalgic Gait velocity: decr    General Gait Details: Pt with slowed gait and pt reports some lightheadedness post-ambulation. When ambulating, pt's HR went up to 145. PT discontinued ambulation due to high HR, got up to 150 while sitting in recliner recovering. No NSVT noted. Pt recovered to 120s with 5 minutes of sitting in recliner.    Stairs             Wheelchair Mobility    Modified Rankin (Stroke Patients Only)       Balance Overall balance assessment: Needs assistance Sitting-balance support: No upper extremity supported;Feet supported Sitting balance-Leahy Scale: Good       Standing balance-Leahy Scale: Fair Standing balance comment: no AD, does not accept challenge                             Cognition Arousal/Alertness: Awake/alert Behavior During Therapy: WFL for tasks assessed/performed Overall Cognitive Status: Within Functional Limits for tasks assessed                                        Exercises General Exercises - Lower Extremity Ankle Circles/Pumps: AROM;Both;10 reps;Seated Quad Sets: AROM;Both;10 reps;Supine Long Arc Quad: (PT demonstrated, pt too fatigued to attempt)    General  Comments        Pertinent Vitals/Pain Pain Assessment: 0-10 Pain Score: 5  Pain Location: abdomen  Pain Descriptors / Indicators: Aching;Sore Pain Intervention(s): Limited activity within patient's tolerance;Repositioned;Monitored during session    Home Living                      Prior Function            PT Goals (current goals can now be found in the care plan section) Acute Rehab PT Goals PT Goal Formulation: With patient Time For Goal Achievement:  05/31/18 Potential to Achieve Goals: Good Progress towards PT goals: Progressing toward goals    Frequency    Min 3X/week      PT Plan Current plan remains appropriate    Co-evaluation              AM-PAC PT "6 Clicks" Daily Activity  Outcome Measure  Difficulty turning over in bed (including adjusting bedclothes, sheets and blankets)?: Unable Difficulty moving from lying on back to sitting on the side of the bed? : Unable Difficulty sitting down on and standing up from a chair with arms (e.g., wheelchair, bedside commode, etc,.)?: Unable Help needed moving to and from a bed to chair (including a wheelchair)?: A Little Help needed walking in hospital room?: A Little Help needed climbing 3-5 steps with a railing? : A Lot 6 Click Score: 11    End of Session   Activity Tolerance: Patient limited by pain;Patient limited by fatigue Patient left: with family/visitor present;with call bell/phone within reach;in chair Nurse Communication: Mobility status PT Visit Diagnosis: Muscle weakness (generalized) (M62.81);Pain Pain - part of body: (abdomen)     Time: 2536-6440 PT Time Calculation (min) (ACUTE ONLY): 19 min  Charges:  $Gait Training: 8-22 mins                     George Lewis, PT Acute Rehabilitation Services Pager [phone]  Office [phone]   George Lewis George Lewis 05/18/2018, 12:23 PM

## 2018-05-18 NOTE — Progress Notes (Signed)
  Echocardiogram 2D Echocardiogram with definity has been performed.  [name] M 05/18/2018, 11:52 AM

## 2018-05-18 NOTE — Progress Notes (Addendum)
Urology Progress Note   2 Days Post-Op s/p Right HA radical nephrectomy  Subjective: Short run of Vtach x 5 beats overnight. 12-lead EKG showed sinus tach. No cardiac complaints, BP stable Gave second cocktail of insulin for K 5.6 UOP 2.8L Chief complaint increasing abominal distension, no flatus, increased belching He is drinking but not eating, low appetite Minimal ambulation but has been OOBTC  Objective: Vital signs in last 24 hours: Temp:  [98 F (36.7 C)-99.2 F (37.3 C)] 98.4 F (36.9 C) (09/20 0424) Pulse Rate:  [85-124] 107 (09/20 0600) Resp:  [17-30] 18 (09/20 0424) BP: (116-148)/(52-87) 138/87 (09/20 0424) SpO2:  [94 %-97 %] 95 % (09/20 0424)  Intake/Output from previous day: 09/19 0701 - 09/20 0700 In: 980.5 [P.O.:540; I.V.:330.5; IV Piggyback:110] Out: 2875 [Urine:2875] Intake/Output this shift: No intake/output data recorded.  Physical Exam:  General: Alert and oriented CV: RRR Lungs: Clear Abdomen: Soft, appropriately tender, diffuse. Increased abdominal distension, tight belly but soft, tympanic. . Incisions c/d/i stapled, gauze removed.  GU: foley removed Ext: NT, No erythema  Lab Results: Recent Labs    05/16/18 1139 05/17/18 0335  HGB 10.9* 10.4*  HCT 34.3* 32.6*   BMET Recent Labs    05/17/18 0837 05/17/18 1447 05/18/18 0519  NA 138  --  141  K 6.4* 5.6* 5.5*  CL 109  --  107  CO2 14*  --  21*  GLUCOSE 185*  --  128*  BUN 40*  --  46*  CREATININE 3.31*  --  3.66*  CALCIUM 8.6*  --  9.1     Studies/Results: No results found.  Assessment/Plan:  62 y.o. male s/p Right HA radical nephrectomy on 05/16/18.   Adequate UOP post op although continuing to treat hyperkalemia. Also with new LE edema, short Vtach yesterday.    - CBC this am to ensure stable Hgb. Daily lab work - regular diet although patient likely developing post op ileus. If N/V will reduce diet. Continue mIVF @ 50cc for now.  - appreciate nephrology recommendations -  will call medicine today and see if any further recommendations re new edema, Vtach, tachycardia   - he may have missed home metoprolol doses. This was restarted this am - OOBTC and ambulation today   Dispo: transfer to floor

## 2018-05-18 NOTE — Consult Note (Signed)
[facility] Medical Consultation  George Lewis. WYO:[id number] DOB: [birth year] DOA: 05/16/2018 PCP: P[facility], [facility]   Requesting physician: Dr. Natividad Brood Date of consultation: 05/18/2018 Reason for consultation: NSVT  HPI:  This is a pleasant 62 year old male with history of morbid obesity, OSA, chronic kidney disease stage IV with a baseline creatinine of 2.7, hypertension, who was admitted on urology service on 9/18 after being found to have suspected RCC and is status post right nephrectomy.  He was recently admitted to the hospital and discharged in August 2019 with sepsis due to UTI as well as bacteremia with enterococcus.  ID was consulted, and completed treatment with Zyvox.  Patient has been doing fairly well postop, has had worsening of his renal function as well as persistent hyperkalemia and nephrology has been consulted and following.  Overnight patient has had 2 episodes of nonsustained V. tach, 4-5 beats, and medicine was consulted this morning.  On my evaluation patient is complaining of abdominal distention and discomfort, as well as hiccups, he tells me he has not passed any gas or has had no bowel movement for the past 3 days.  He has a poor appetite and was barely able to eat, but denies any discomfort, nausea or vomiting with p.o. intake.  He denies any chest pain, denies any palpitations.  He has no shortness of breath.  He denies any fevers or chills.  Review of Systems:  As per HPI, otherwise 10 point review of systems negative   Impression/Recommendations Active Problems:   Renal mass, right    1. NSVT -most likely in the setting of electrolyte abnormalities, will need potassium correction, administer Kayexalate x1 again this morning. Will check a magnesium level and order a 2D echo.  EKG without significant findings showing sinus rhythm.  Continue to keep on telemetry.  Patient denies any cardiac history, did have a stress test "many years  ago" which was normal. 2. Acute kidney injury on chronic kidney disease stage IV -nephrology following, status post Lasix on 9/19 with good urine output.  Has some lower extremity edema but does not look overtly fluid overloaded right now.  Will obtain daily weights. 3. Probable ileus -patient not really active post op, less likely small bowel obstruction without any nausea or vomiting.  Given that he is taking narcotics for pain, will increase bowel regimen.  Will obtain an abdominal x-ray 4. Sinus tachycardia -possibly postop vs pain/abdominal discomfort.  2D echo as above 5. OSA -continue CPAP 6. Hypertension -currently on home metoprolol 25 twice daily, continue, blood pressure stable 7. Right renal mass -status post nephrectomy, per primary   We will followup again tomorrow. Please contact me if I can be of assistance in the meanwhile. Thank you for this consultation.   Past Medical History:  Diagnosis Date  . Arthritis    knees  . Bilateral renal cysts 03/23/2018   Noted MRI ABD  . Cholelithiasis 03/19/2018   noted on CT AB/Pelvis  . CKD (chronic kidney disease), stage IV [facility])    nephrologist-- dr Hollie Salk [name]);  05-01-2018 has not started dialysis, scheduled for AV fistula creation 05-07-2018  . Diverticulosis of colon 04/19/2018   Noted on CT abd/pelvis  . GERD (gastroesophageal reflux disease)   . Hepatic steatosis 03/19/2018   Mild diffuse, noted on CT AB/Pelvis  . History of kidney stones   . History of pulmonary embolus (PE) 03/2008   treated with coumadin for 6 months  . History of sepsis  04/20/2018   per discharge note , probable UTI  . Hypertension   . Nocturia   . OSA on CPAP   . Pancreatic lesion    0.4cm cystic per CT 07/ 2019  . Pneumonia   . Pulmonary nodule    Solid 63mm Right Lower Lobe  . Right renal mass 04/10/2018   new dx--  scheduled for nephrectomy 05-16-2018  . S/P ureteral stent placement: 04/16/2018 04/19/2018   Past Surgical History:   Procedure Laterality Date  . AV FISTULA PLACEMENT Left 05/07/2018   Procedure: Creation of Left arm Radiocephalic Fistula;  Surgeon: Marty Heck, MD;  Location: [facility];  Service: Vascular;  Laterality: Left;  . CYSTOSCOPY/RETROGRADE/URETEROSCOPY/STONE EXTRACTION WITH BASKET  x2 last one 1990s approx.  . CYSTOSCOPY/URETEROSCOPY/HOLMIUM LASER/STENT PLACEMENT Left 04/16/2018   Procedure: CYSTOSCOPY/LEFT URETEROSCOPY/LEFT RETROGRADE/STENT PLACEMENT;  Surgeon: Lucas Mallow, MD;  Location: [facility];  Service: Urology;  Laterality: Left;  . EUS N/A 05/03/2018   Procedure: UPPER ENDOSCOPIC ULTRASOUND (EUS) RADIAL;  Surgeon: Milus Banister, MD;  Location: [facility];  Service: Gastroenterology;  Laterality: N/A;  . EUS N/A 05/03/2018   Procedure: UPPER ENDOSCOPIC ULTRASOUND (EUS) LINEAR;  Surgeon: Milus Banister, MD;  Location: [facility];  Service: Gastroenterology;  Laterality: N/A;  . EXTRACORPOREAL SHOCK WAVE LITHOTRIPSY  x3  last one 2004 approx.  Marland Kitchen FINE NEEDLE ASPIRATION N/A 05/03/2018   Procedure: FINE NEEDLE ASPIRATION (FNA) LINEAR;  Surgeon: Milus Banister, MD;  Location: [facility];  Service: Gastroenterology;  Laterality: N/A;  . LAPAROSCOPIC NEPHRECTOMY, HAND ASSISTED Right 05/16/2018   Procedure: HAND ASSISTED LAPAROSCOPIC RIGHT NEPHRECTOMY;  Surgeon: Lucas Mallow, MD;  Location: [facility];  Service: Urology;  Laterality: Right;  . left index finger attachment  1980s   3-4 surgeries  . TOE SURGERY  1980s   in beween 2nd and 3rd toes cyst removed   Social History:  reports that he has never smoked. He has never used smokeless tobacco. He reports that he does not drink alcohol or use drugs.  Allergies  Allergen Reactions  . Penicillins Rash and Other (See Comments)    Has patient had a PCN reaction causing immediate rash, facial/tongue/throat swelling, SOB or lightheadedness with hypotension: yes Has patient had a PCN reaction causing severe rash involving mucus  membranes or skin necrosis: no Has patient had a PCN reaction that required hospitalization: no Has patient had a PCN reaction occurring within the last 10 years: no If all of the above answers are "NO", then may proceed with Cephalosporin use.    Family History  Problem Relation Age of Onset  . Alzheimer's disease Mother   . Alzheimer's disease Father   . Heart disease Father     Prior to Admission medications   Medication Sig Start Date End Date Taking? Authorizing Provider  acetaminophen (TYLENOL) 500 MG tablet Take 500-1,000 mg by mouth every 8 (eight) hours as needed for mild pain.    Yes [provider]  allopurinol (ZYLOPRIM) 300 MG tablet Take 300 mg by mouth every evening.  03/08/18  Yes [provider]  diphenhydramine-acetaminophen (TYLENOL PM) 25-500 MG TABS tablet Take 1 tablet by mouth at bedtime as needed (sleep).   Yes [provider]  fluticasone (FLONASE) 50 MCG/ACT nasal spray Place 2 sprays into both nostrils daily as needed for allergies or rhinitis.   Yes [provider]  HYDROcodone-acetaminophen (NORCO/VICODIN) 5-325 MG tablet Take 1 tablet by mouth every 4 (four) hours as needed for  moderate pain. 04/16/18 04/16/19 Yes Marton Redwood III, MD  loratadine (CLARITIN) 10 MG tablet Take 10 mg by mouth every evening.    Yes [provider]  metoprolol tartrate (LOPRESSOR) 25 MG tablet Take 1 tablet (25 mg total) by mouth 2 (two) times daily. 04/23/18  Yes Eugenie Filler, MD  omeprazole (PRILOSEC OTC) 20 MG tablet Take 40 mg by mouth every evening.    Yes [provider]  oxyCODONE-acetaminophen (PERCOCET) 5-325 MG tablet Take 1 tablet by mouth every 6 (six) hours as needed for moderate pain. 05/07/18 05/07/19 Yes Marty Heck, MD  Potassium Citrate 15 MEQ (1620 MG) TBCR Take 1 tablet by mouth every evening.  03/22/18  Yes [provider]  tamsulosin (FLOMAX) 0.4 MG CAPS capsule Take 0.4 mg by mouth every  evening.  03/08/18  Yes [provider]   Physical Exam: Blood pressure 138/87, pulse (!) 107, temperature 98.4 F (36.9 C), temperature source Oral, resp. rate 18, height 6\' 1"  (1.854 m), weight (!) 141.1 kg, SpO2 95 %. Vitals:   05/18/18 0457 05/18/18 0600  BP:    Pulse: (!) 114 (!) 107  Resp:    Temp:    SpO2:       General: NAD, obese Caucasian male  Eyes: No scleral icterus  Cardiovascular: Regular rate and rhythm, no murmurs heard.  1+ pitting lower extremity edema.  Good peripheral pulses  Respiratory: Clear to auscultation bilaterally, overall diminished breath sounds.  No wheezing or crackles.  Abdomen: Slightly distended, mild tenderness to palpation, no guarding or rebound.  Bowel sounds positive.  Clean surgical incision with staples in place, no drainage or significant surrounding erythema  Skin: No rashes seen  Musculoskeletal: Normal muscle mass  Psychiatric: Normal mood, alert and oriented x4  Neurologic: No focal deficits, equal strength  Labs on Admission:  Basic Metabolic Panel: Recent Labs  Lab 05/16/18 1139 05/17/18 0335 05/17/18 0837 05/17/18 1447 05/18/18 0519  NA 140 137 138  --  141  K 4.7 6.3* 6.4* 5.6* 5.5*  CL 107 106 109  --  107  CO2 21* 20* 14*  --  21*  GLUCOSE 200* 209* 185*  --  128*  BUN 29* 37* 40*  --  46*  CREATININE 2.62* 3.12* 3.31*  --  3.66*  CALCIUM 8.0* 8.4* 8.6*  --  9.1   Liver Function Tests: No results for input(s): AST, ALT, ALKPHOS, BILITOT, PROT, ALBUMIN in the last 168 hours. No results for input(s): LIPASE, AMYLASE in the last 168 hours. No results for input(s): AMMONIA in the last 168 hours. CBC: Recent Labs  Lab 05/16/18 1139 05/17/18 0335  WBC  --  14.1*  HGB 10.9* 10.4*  HCT 34.3* 32.6*  MCV  --  88.8  PLT  --  235   Cardiac Enzymes: No results for input(s): CKTOTAL, CKMB, CKMBINDEX, TROPONINI in the last 168 hours. BNP: Invalid input(s): POCBNP CBG: No results for input(s): GLUCAP  in the last 168 hours.  Radiological Exams on Admission: No results found.  EKG: Independently reviewed.  Sinus rhythm  Marzetta Board [facility] Pager [phone]  If 7PM-7AM, please contact night-coverage [email or web address] Password [email or web address] 05/18/2018, 8:30 AM

## 2018-05-18 NOTE — Care Management Note (Signed)
Case Management Note  Patient Details  Name: George Lewis. MRN: [id number] Date of Birth: 30-Oct-1955  Subjective/Objective: Admitted wRenal mass. From home w/spouse. PT recc HHPT-provided patient/spouse with [place] agency list-await choice. Will need HHPT & face to face order.                   Action/Plan:dc home w/HHC.   Expected Discharge Date:                  Expected Discharge Plan:  [place]  In-House Referral:     Discharge planning Services  CM Consult  Post Acute Care Choice:    Choice offered to:  Patient  DME Arranged:    DME Agency:     HH Arranged:    [place] Agency:     Status of Service:  In process, will continue to follow  If discussed at Long Length of Stay Meetings, dates discussed:    Additional Comments:  Dessa Phi, RN 05/18/2018, 12:30 PM

## 2018-05-18 NOTE — Progress Notes (Addendum)
[name] Progress Note  Subjective: K down 5.5, no BM or flatus.   Vitals:   05/18/18 0424 05/18/18 0457 05/18/18 0600 05/18/18 1412  BP: 138/87   (!) 142/86  Pulse: (!) 124 (!) 114 (!) 107 (!) 114  Resp: 18   19  Temp: 98.4 F (36.9 C)   (!) 100.4 F (38 C)  TempSrc: Oral   Oral  SpO2: 95%   94%  Weight:      Height:        Inpatient medications: . allopurinol  300 mg Oral QPM  . heparin  5,000 Units Subcutaneous Q8H  . loratadine  10 mg Oral QPM  . mouth rinse  15 mL Mouth Rinse BID  . metoprolol tartrate  25 mg Oral BID  . pantoprazole  80 mg Oral QPM  . pneumococcal 23 valent vaccine  0.5 mL Intramuscular Tomorrow-1000  . polyethylene glycol  17 g Oral BID  . senna-docusate  2 tablet Oral BID  . tamsulosin  0.4 mg Oral QPM   . sodium chloride 50 mL/hr at 05/17/18 0906  . magnesium sulfate 1 - 4 g bolus IVPB     acetaminophen, morphine injection, neomycin-bacitracin-polymyxin, ondansetron, oxyCODONE  Iron/TIBC/Ferritin/ %Sat    Component Value Date/Time   IRON 19 (L) 04/20/2018 0339   TIBC 260 04/20/2018 0339   FERRITIN 274 04/20/2018 0339   IRONPCTSAT 7 (L) 04/20/2018 0339    Home meds:  - allopurinol 300 hs/ loratadine 10 hs/ omeprazole 40 qd/ tamsulosin 0.4 qd  - oxycodone -acetaminophen 5-325 qid prn/ hydrocodone - aceta prn  - metoprolol tartrate 25 bid/ potassium citrate 15 mEq qhs   Exam: Gen obese, pleasant, nasal O2, no distress No jvd or bruits Chest clear bilat , dec'd at bases RRR no MRG Abd soft distended, tympanitic,  no mass or ascites,+ midline dressing GU normal male w foley out now Ext 1+ LE edema Neuro is alert, Ox 3 , nf   Impression: 1. AKI on CKD IV - baseline creat 2.7. Creat up today slightly to 3.6, this is expected after nephrectomy.  Creat should level off soon.  2. Hyperkalemia - cont low K+ diet. We should avoid further kayexalate or Veltassa for now with his bowel dysfunction/ ileus.   3. Post-op  ileus - Mg replaced per [facility], getting IVF at 50/ hr now, agree 4. NSVT - per [facility] 5. SP hand-assisted lap R nephrectomy 9/18- for R renal mass 6. HTN - getting home metop 25 bid 7. Volume - mild vol excess, no gross vol overload   Plan - as above   Kelly Splinter MD [employer] pager [phone]   05/18/2018, 2:19 PM   Recent Labs  Lab 05/17/18 0837 05/17/18 1447 05/18/18 0519  NA 138  --  141  K 6.4* 5.6* 5.5*  CL 109  --  107  CO2 14*  --  21*  GLUCOSE 185*  --  128*  BUN 40*  --  46*  CREATININE 3.31*  --  3.66*  CALCIUM 8.6*  --  9.1   No results for input(s): AST, ALT, ALKPHOS, BILITOT, PROT in the last 168 hours. Recent Labs  Lab 05/17/18 0335 05/18/18 0756  WBC 14.1* 13.5*  HGB 10.4* 11.3*  HCT 32.6* 34.7*  MCV 88.8 86.5  PLT 235 264

## 2018-05-19 DIAGNOSIS — I471 Supraventricular tachycardia: Secondary | ICD-10-CM

## 2018-05-19 LAB — BASIC METABOLIC PANEL
ANION GAP: 11 (ref 5–15)
BUN: 48 mg/dL — ABNORMAL HIGH (ref 6–20)
CALCIUM: 8.6 mg/dL — AB (ref 8.9–10.3)
CO2: 25 mmol/L (ref 22–32)
Chloride: 103 mmol/L (ref 98–111)
Creatinine, Ser: 3.86 mg/dL — ABNORMAL HIGH (ref 0.61–1.24)
GFR, EST AFRICAN AMERICAN: 18 mL/min — AB (ref >60)
GFR, EST NON AFRICAN AMERICAN: 16 mL/min — AB (ref >60)
GLUCOSE: 127 mg/dL — AB (ref 70–99)
POTASSIUM: 5.1 mmol/L (ref 3.5–5.1)
Sodium: 139 mmol/L (ref 135–145)

## 2018-05-19 MED ORDER — ONDANSETRON HCL 4 MG/2ML IJ SOLN
4.0000 mg | Freq: Four times a day (QID) | INTRAMUSCULAR | Status: DC | PRN
  Administered 2018-05-19: 8 mg via INTRAVENOUS
  Administered 2018-05-19 (×2): 4 mg via INTRAVENOUS
  Filled 2018-05-19: qty 4
  Filled 2018-05-19 (×2): qty 2
  Filled 2018-05-19: qty 4

## 2018-05-19 NOTE — Progress Notes (Signed)
[name] Progress Note  Subjective: K down 5.1, +flatus overnight, sp enema.  +nausea which is new.  UOP yest 500 cc.  On FL's now.  Supposed to get OOB.   Vitals:   05/18/18 2118 05/18/18 2126 05/19/18 0545 05/19/18 1512  BP:  134/74 138/90 (!) 152/87  Pulse:  95 83 91  Resp: 18 14 18 16   Temp:  98.4 F (36.9 C) 98.5 F (36.9 C) 99.9 F (37.7 C)  TempSrc:  Oral Oral Oral  SpO2:  96% 98% 97%  Weight:   (!) 143.7 kg   Height:        Inpatient medications: . allopurinol  300 mg Oral QPM  . heparin  5,000 Units Subcutaneous Q8H  . loratadine  10 mg Oral QPM  . mouth rinse  15 mL Mouth Rinse BID  . metoprolol tartrate  25 mg Oral BID  . pantoprazole  80 mg Oral QPM  . pneumococcal 23 valent vaccine  0.5 mL Intramuscular Tomorrow-1000  . polyethylene glycol  17 g Oral BID  . senna-docusate  2 tablet Oral BID  . tamsulosin  0.4 mg Oral QPM   . sodium chloride 50 mL/hr at 05/18/18 2203   acetaminophen, morphine injection, neomycin-bacitracin-polymyxin, ondansetron (ZOFRAN) IV, oxyCODONE  Iron/TIBC/Ferritin/ %Sat    Component Value Date/Time   IRON 19 (L) 04/20/2018 0339   TIBC 260 04/20/2018 0339   FERRITIN 274 04/20/2018 0339   IRONPCTSAT 7 (L) 04/20/2018 0339    Home meds:  - allopurinol 300 hs/ loratadine 10 hs/ omeprazole 40 qd/ tamsulosin 0.4 qd  - oxycodone -acetaminophen 5-325 qid prn/ hydrocodone - aceta prn  - metoprolol tartrate 25 bid/ potassium citrate 15 mEq qhs   Exam: Gen obese, pleasant, nasal O2, no distress No jvd or bruits Chest clear bilat , dec'd at bases RRR no MRG Abd soft distended, tympanitic,  no mass or ascites,+ midline dressing GU normal male  Ext 1+ LE edema Neuro is alert, Ox 3 , nf   Impression: 1. 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  Baseline creat 2.7. Creat up today slightly to 3.8, cont to follow.  No new suggestions.  2. Hyperkalemia - cont low K+ diet. We should avoid further kayexalate  or Veltassa for now with his bowel dysfunction/ ileus.   3. Post-op ileus - Mg replaced per [facility], getting IVF at 50/ hr now, agree 4. NSVT - per [facility] 5. SP hand-assisted lap R nephrectomy 9/18- for R renal mass 6. HTN - getting home metop 25 bid 7. Volume - mild vol excess, not grossly overloaded   Plan - as above   Kelly Splinter MD [employer] pager [phone]   05/19/2018, 4:35 PM   Recent Labs  Lab 05/18/18 0519 05/19/18 0533  NA 141 139  K 5.5* 5.1  CL 107 103  CO2 21* 25  GLUCOSE 128* 127*  BUN 46* 48*  CREATININE 3.66* 3.86*  CALCIUM 9.1 8.6*   No results for input(s): AST, ALT, ALKPHOS, BILITOT, PROT in the last 168 hours. Recent Labs  Lab 05/17/18 0335 05/18/18 0756  WBC 14.1* 13.5*  HGB 10.4* 11.3*  HCT 32.6* 34.7*  MCV 88.8 86.5  PLT 235 264

## 2018-05-19 NOTE — Progress Notes (Signed)
Physical Therapy Treatment Patient Details Name: George Lewis. MRN: [id number] DOB: Mar 17, 1956 Today's Date: 05/19/2018    History of Present Illness  Nephrectomy    PT Comments    Pt feeling better.  Tolerated amb an increased distance holding to IV pole.  Avg HR 125.  Improved.   Follow Up Recommendations  Home health PT;Supervision for mobility/OOB     Equipment Recommendations  None recommended by PT    Recommendations for Other Services       Precautions / Restrictions Precautions Precautions: Fall Precaution Comments: HOB elevated and use of rail Restrictions Weight Bearing Restrictions: No    Mobility  Bed Mobility Overal bed mobility: Needs Assistance Bed Mobility: Supine to Sit       Sit to supine: Mod assist;HOB elevated   General bed mobility comments: assist for upper body  Transfers Overall transfer level: Needs assistance Equipment used: None Transfers: Sit to/from Stand Sit to Stand: Min guard;Supervision         General transfer comment: able to self rise from elevated bed and control sit to recliner with increased time due to ABD pain  Ambulation/Gait Ambulation/Gait assistance: Supervision;Min guard Gait Distance (Feet): 350 Feet Assistive device: None(holding to IV pole) Gait Pattern/deviations: Step-through pattern     General Gait Details: pt tolerated an increased distance with avg HR 125   Stairs             Wheelchair Mobility    Modified Rankin (Stroke Patients Only)       Balance                                            Cognition Arousal/Alertness: Awake/alert Behavior During Therapy: WFL for tasks assessed/performed Overall Cognitive Status: Within Functional Limits for tasks assessed                                        Exercises      General Comments        Pertinent Vitals/Pain Pain Assessment: Faces Faces Pain Scale: Hurts a little bit Pain  Location: ABD Pain Descriptors / Indicators: Aching;Sore Pain Intervention(s): Monitored during session    Home Living                      Prior Function            PT Goals (current goals can now be found in the care plan section) Progress towards PT goals: Progressing toward goals    Frequency    Min 3X/week      PT Plan Current plan remains appropriate    Co-evaluation              AM-PAC PT "6 Clicks" Daily Activity  Outcome Measure  Difficulty turning over in bed (including adjusting bedclothes, sheets and blankets)?: None Difficulty moving from lying on back to sitting on the side of the bed? : None Difficulty sitting down on and standing up from a chair with arms (e.g., wheelchair, bedside commode, etc,.)?: None Help needed moving to and from a bed to chair (including a wheelchair)?: None Help needed walking in hospital room?: None Help needed climbing 3-5 steps with a railing? : None 6 Click Score: 24    End of Session Equipment  Utilized During Treatment: Gait belt Activity Tolerance: Patient limited by pain;Patient limited by fatigue Patient left: with family/visitor present;with call bell/phone within reach;in chair Nurse Communication: Mobility status PT Visit Diagnosis: Muscle weakness (generalized) (M62.81);Pain Pain - Right/Left: Right     Time: 1000-1020 PT Time Calculation (min) (ACUTE ONLY): 20 min  Charges:  $Gait Training: 8-22 mins                     Rica Koyanagi  PTA Acute  Rehabilitation Services Pager      [phone] Office      [phone]

## 2018-05-19 NOTE — Progress Notes (Signed)
PROGRESS NOTE    George Lewis.  NAT:[id number] DOB: [birth year] DOA: 05/16/2018 PCP: Pc, [facility]  Brief Narrative: 62 year old male with history of morbid obesity, OSA, chronic kidney disease stage IV with a baseline creatinine of 2.7, hypertension, who was admitted on urology service on 9/18 after being found to have suspected RCC and is status post right nephrectomy.  He was recently admitted to the hospital and discharged in August 2019 with sepsis due to UTI as well as bacteremia with enterococcus.  ID was consulted, and completed treatment with Zyvox.  Patient has been doing fairly well postop, has had worsening of his renal function as well as persistent hyperkalemia and nephrology has been consulted and following.  Overnight patient has had 2 episodes of nonsustained V. tach, 4-5 beats, and medicine was consulted this morning.  On my evaluation patient is complaining of abdominal distention and discomfort, as well as hiccups, he tells me he has not passed any gas or has had no bowel movement for the past 3 days.  He has a poor appetite and was barely able to eat, but denies any discomfort, nausea or vomiting with p.o. intake.  He denies any chest pain, denies any palpitations.  He has no shortness of breath.  He denies any fevers or chills.   Assessment & Plan:   Active Problems:   Renal mass, right  1. NSVT -no further episodes of NSVT.  Potassium down to 5.1 after Kayexalate x1.  Magnesium 1.6 yesterday will replete.  Follow-up echo shows normal left ventricular systolic function, mild diastolic dysfunction, mild LVH ejection fraction 50 to 55%.  No wall motion abnormalities..  Continue telemetry.   2.  2Acute kidney injury on chronic kidney disease stage IV -nephrology following, status post Lasix on 9/19 with good urine output.  Has some lower extremity edema but does not look overtly fluid overloaded right now.  3. Probable ileus -patient not really active post  op, less likely small bowel obstruction without any nausea or vomiting.  Given that he is taking narcotics for pain, will increase bowel regimen.  KUB shows dilated large and small bowel loops in the abdomen and pelvis.  He denies any nausea vomiting he has not had a bowel movement.  Would aim to keep the patient n.p.o.  Follow-up KUB tomorrow.  Minimize narcotics.  Replace electrolytes and follow. 4. Sinus tachycardia -possibly postop vs pain/abdominal discomfort.  2D echo as above 5. OSA -continue CPAP 6. Hypertension -currently on home metoprolol 25 twice daily, continue, blood pressure stable 7. Right renal mass -status post nephrectomy, per primary    DVT prophylaxis: Heparin Code Status: Full code   Procedures: None Antimicrobials: None Subjective: Resting with the CPAP on denies any complaints no nausea vomiting dry heaving abdominal pain.   Objective: Vitals:   05/18/18 1745 05/18/18 2118 05/18/18 2126 05/19/18 0545  BP:   134/74 138/90  Pulse:   95 83  Resp:  18 14 18   Temp:   98.4 F (36.9 C) 98.5 F (36.9 C)  TempSrc:   Oral Oral  SpO2:   96% 98%  Weight: (!) 142.1 kg   (!) 143.7 kg  Height:        Intake/Output Summary (Last 24 hours) at 05/19/2018 1009 Last data filed at 05/19/2018 0952 Gross per 24 hour  Intake 1191.18 ml  Output 1150 ml  Net 41.18 ml   Filed Weights   05/16/18 0704 05/18/18 1745 05/19/18 0545  Weight: (!) 141.1  kg (!) 142.1 kg (!) 143.7 kg    Examination:  General exam: Appears calm and comfortable  Respiratory system: Clear to auscultation. Respiratory effort normal. Cardiovascular system: S1 & S2 heard, RRR. No JVD, murmurs, rubs, gallops or clicks. No pedal edema. Gastrointestinal system: Abdomen is distended, soft and nontender. No organomegaly or masses felt.  Diminished bowel sounds heard. Central nervous system: Alert and oriented. No focal neurological deficits. Extremities: Symmetric 5 x 5 power. Skin: No rashes, lesions or  ulcers Psychiatry: Judgement and insight appear normal. Mood & affect appropriate.     Data Reviewed: I have personally reviewed following labs and imaging studies  CBC: Recent Labs  Lab 05/16/18 1139 05/17/18 0335 05/18/18 0756  WBC  --  14.1* 13.5*  HGB 10.9* 10.4* 11.3*  HCT 34.3* 32.6* 34.7*  MCV  --  88.8 86.5  PLT  --  235 240   Basic Metabolic Panel: Recent Labs  Lab 05/16/18 1139 05/17/18 0335 05/17/18 0837 05/17/18 1447 05/18/18 0519 05/18/18 0812 05/19/18 0533  NA 140 137 138  --  141  --  139  K 4.7 6.3* 6.4* 5.6* 5.5*  --  5.1  CL 107 106 109  --  107  --  103  CO2 21* 20* 14*  --  21*  --  25  GLUCOSE 200* 209* 185*  --  128*  --  127*  BUN 29* 37* 40*  --  46*  --  48*  CREATININE 2.62* 3.12* 3.31*  --  3.66*  --  3.86*  CALCIUM 8.0* 8.4* 8.6*  --  9.1  --  8.6*  MG  --   --   --   --   --  1.6*  --    GFR: Estimated Creatinine Clearance: 30.7 mL/min (A) (by C-G formula based on SCr of 3.86 mg/dL (H)). Liver Function Tests: No results for input(s): AST, ALT, ALKPHOS, BILITOT, PROT, ALBUMIN in the last 168 hours. No results for input(s): LIPASE, AMYLASE in the last 168 hours. No results for input(s): AMMONIA in the last 168 hours. Coagulation Profile: No results for input(s): INR, PROTIME in the last 168 hours. Cardiac Enzymes: No results for input(s): CKTOTAL, CKMB, CKMBINDEX, TROPONINI in the last 168 hours. BNP (last 3 results) No results for input(s): PROBNP in the last 8760 hours. HbA1C: No results for input(s): HGBA1C in the last 72 hours. CBG: No results for input(s): GLUCAP in the last 168 hours. Lipid Profile: No results for input(s): CHOL, HDL, LDLCALC, TRIG, CHOLHDL, LDLDIRECT in the last 72 hours. Thyroid Function Tests: No results for input(s): TSH, T4TOTAL, FREET4, T3FREE, THYROIDAB in the last 72 hours. Anemia Panel: No results for input(s): VITAMINB12, FOLATE, FERRITIN, TIBC, IRON, RETICCTPCT in the last 72 hours. Sepsis  Labs: No results for input(s): PROCALCITON, LATICACIDVEN in the last 168 hours.  No results found for this or any previous visit (from the past 240 hour(s)).       Radiology Studies: Dg Abd Portable 2v  Result Date: 05/18/2018 CLINICAL DATA:  Abdominal distention.  No bowel movement in 3 days. EXAM: PORTABLE ABDOMEN - 2 VIEW COMPARISON:  CT 04/19/2018 FINDINGS: Gaseous distention of both large and small bowel loops. Rectosigmoid colon less dilated. No free air organomegaly. No acute bony abnormality. IMPRESSION: Dilated large and small bowel loops in the abdomen and pelvis. Favor ileus. Electronically Signed   By: Rolm Baptise M.D.   On: 05/18/2018 09:37        Scheduled Meds: . allopurinol  300 mg Oral QPM  . heparin  5,000 Units Subcutaneous Q8H  . loratadine  10 mg Oral QPM  . mouth rinse  15 mL Mouth Rinse BID  . metoprolol tartrate  25 mg Oral BID  . pantoprazole  80 mg Oral QPM  . pneumococcal 23 valent vaccine  0.5 mL Intramuscular Tomorrow-1000  . polyethylene glycol  17 g Oral BID  . senna-docusate  2 tablet Oral BID  . tamsulosin  0.4 mg Oral QPM   Continuous Infusions: . sodium chloride 50 mL/hr at 05/18/18 2203  . acetaminophen 1,000 mg (05/19/18 0558)     LOS: 3 days     Georgette Shell, MD [facility]  If 7PM-7AM, please contact night-coverage [email or web address] Password [email or web address] 05/19/2018, 10:09 AM

## 2018-05-19 NOTE — Progress Notes (Signed)
3 Days Post-Op Subjective: Patient reports some flatus last pm after enema. Still feels bloated.  Objective: Vital signs in last 24 hours: Temp:  [98.4 F (36.9 C)-100.4 F (38 C)] 98.5 F (36.9 C) (09/21 0545) Pulse Rate:  [83-114] 83 (09/21 0545) Resp:  [14-19] 18 (09/21 0545) BP: (134-142)/(74-90) 138/90 (09/21 0545) SpO2:  [94 %-98 %] 98 % (09/21 0545) Weight:  [142.1 kg-143.7 kg] 143.7 kg (09/21 0545)  Intake/Output from previous day: 09/20 0701 - 09/21 0700 In: 563.7 [P.O.:480; IV Piggyback:83.7] Out: 500 [Urine:500] Intake/Output this shift: No intake/output data recorded.  Physical Exam:  Constitutional: Vital signs reviewed. WD WN in NAD   Eyes: PERRL, No scleral icterus.   Cardiovascular: RRR Pulmonary/Chest: Normal effort Abdominal: Morbidly obese. Incisions C/D/I. Appropriately tender. organomegaly, or guarding present.    Lab Results: Recent Labs    05/16/18 1139 05/17/18 0335 05/18/18 0756  HGB 10.9* 10.4* 11.3*  HCT 34.3* 32.6* 34.7*   BMET Recent Labs    05/18/18 0519 05/19/18 0533  NA 141 139  K 5.5* 5.1  CL 107 103  CO2 21* 25  GLUCOSE 128* 127*  BUN 46* 48*  CREATININE 3.66* 3.86*  CALCIUM 9.1 8.6*   No results for input(s): LABPT, INR in the last 72 hours. No results for input(s): LABURIN in the last 72 hours. Results for orders placed or performed during the hospital encounter of 04/19/18  Blood Culture (routine x 2)     Status: Abnormal   Collection Time: 04/19/18  3:25 PM  Result Value Ref Range Status   Specimen Description BLOOD RIGHT HAND  Final   Special Requests   Final    BOTTLES DRAWN AEROBIC AND ANAEROBIC Blood Culture adequate volume   Culture  Setup Time   Final    GRAM POSITIVE COCCI AEROBIC BOTTLE ONLY CRITICAL RESULT CALLED TO, READ BACK BY AND VERIFIED WITH: B.GREEN,PHARMD AT 0510 ON 04/22/18 BY [email or web address] Performed at [facility], [place] [address] [place], [place] [address]    Culture ENTEROCOCCUS FAECALIS  (A)  Final   Report Status 04/24/2018 FINAL  Final   Organism ID, Bacteria ENTEROCOCCUS FAECALIS  Final      Susceptibility   Enterococcus faecalis - MIC*    AMPICILLIN <=2 SENSITIVE Sensitive     VANCOMYCIN 1 SENSITIVE Sensitive     GENTAMICIN SYNERGY SENSITIVE Sensitive     * ENTEROCOCCUS FAECALIS  Blood Culture ID Panel (Reflexed)     Status: Abnormal   Collection Time: 04/19/18  3:25 PM  Result Value Ref Range Status   Enterococcus species DETECTED (A) NOT DETECTED Final    Comment: CRITICAL RESULT CALLED TO, READ BACK BY AND VERIFIED WITH: B.GREEN,PHARMD AT 0510 ON 04/22/18 BY G.[email or web address]    Vancomycin resistance NOT DETECTED NOT DETECTED Final   Listeria monocytogenes NOT DETECTED NOT DETECTED Final   Staphylococcus species NOT DETECTED NOT DETECTED Final   Staphylococcus aureus NOT DETECTED NOT DETECTED Final   Streptococcus species NOT DETECTED NOT DETECTED Final   Streptococcus agalactiae NOT DETECTED NOT DETECTED Final   Streptococcus pneumoniae NOT DETECTED NOT DETECTED Final   Streptococcus pyogenes NOT DETECTED NOT DETECTED Final   Acinetobacter baumannii NOT DETECTED NOT DETECTED Final   Enterobacteriaceae species NOT DETECTED NOT DETECTED Final   Enterobacter cloacae complex NOT DETECTED NOT DETECTED Final   Escherichia coli NOT DETECTED NOT DETECTED Final   Klebsiella oxytoca NOT DETECTED NOT DETECTED Final   Klebsiella pneumoniae NOT DETECTED NOT DETECTED Final   Proteus species  NOT DETECTED NOT DETECTED Final   Serratia marcescens NOT DETECTED NOT DETECTED Final   Haemophilus influenzae NOT DETECTED NOT DETECTED Final   Neisseria meningitidis NOT DETECTED NOT DETECTED Final   Pseudomonas aeruginosa NOT DETECTED NOT DETECTED Final   Candida albicans NOT DETECTED NOT DETECTED Final   Candida glabrata NOT DETECTED NOT DETECTED Final   Candida krusei NOT DETECTED NOT DETECTED Final   Candida parapsilosis NOT DETECTED NOT DETECTED Final   Candida tropicalis NOT  DETECTED NOT DETECTED Final    Comment: Performed at [facility], [place] [address], [place], [place] [address]  Urine culture     Status: Abnormal   Collection Time: 04/19/18  6:01 PM  Result Value Ref Range Status   Specimen Description   Final    URINE, CLEAN CATCH Performed at [facility], [place] [address], [place], [place] [address]    Special Requests   Final    NONE Performed at [facility] [place] [address], [place], [place] [address]    Culture MULTIPLE SPECIES PRESENT, SUGGEST RECOLLECTION (A)  Final   Report Status 04/21/2018 FINAL  Final  MRSA PCR Screening     Status: None   Collection Time: 04/19/18  7:06 PM  Result Value Ref Range Status   MRSA by PCR NEGATIVE NEGATIVE Final    Comment:        The GeneXpert MRSA Assay (FDA approved for NASAL specimens only), is one component of a comprehensive MRSA colonization surveillance program. It is not intended to diagnose MRSA infection nor to guide or monitor treatment for MRSA infections. Performed at [facility], [place] [address], [place], [place] [address]   Blood Culture (routine x 2)     Status: None   Collection Time: 04/19/18  8:09 PM  Result Value Ref Range Status   Specimen Description   Final    BLOOD RIGHT ANTECUBITAL Performed at [place] [address], [place], [place] [address]    Special Requests   Final    BOTTLES DRAWN AEROBIC AND ANAEROBIC Blood Culture adequate volume Performed at [place] [address] [place], [place] [address]    Culture   Final    NO GROWTH 5 DAYS Performed at [facility], [place] [address], [place], [place] [address]    Report Status 04/25/2018 FINAL  Final  Culture, blood (routine x 2)     Status: None   Collection Time: 04/22/18  1:05 PM  Result Value Ref Range Status   Specimen Description BLOOD RIGHT HAND  Final   Special Requests   Final    BOTTLES  DRAWN AEROBIC AND ANAEROBIC Blood Culture adequate volume   Culture   Final    NO GROWTH 5 DAYS Performed at [facility], [place] [address] [place], [place] [address]    Report Status 04/27/2018 FINAL  Final  Culture, blood (routine x 2)     Status: None   Collection Time: 04/22/18  1:07 PM  Result Value Ref Range Status   Specimen Description BLOOD RIGHT ARM  Final   Special Requests   Final    BOTTLES DRAWN AEROBIC ONLY Blood Culture adequate volume   Culture   Final    NO GROWTH 5 DAYS Performed at [facility], [place] [address] [place], [place] [address]    Report Status 04/27/2018 FINAL  Final    Studies/Results: Dg Abd Portable 2v  Result Date: 05/18/2018 CLINICAL DATA:  Abdominal distention.  No bowel movement in 3 days. EXAM: PORTABLE ABDOMEN - 2 VIEW COMPARISON:  CT 04/19/2018 FINDINGS: Gaseous distention of both large and small bowel loops. Rectosigmoid colon less dilated. No free air organomegaly. No acute bony abnormality. IMPRESSION: Dilated large and small bowel loops in the abdomen and pelvis. Favor ileus. Electronically Signed   By: Rolm Baptise M.D.   On: 05/18/2018 09:37    Assessment/Plan:   POD 3 Rt HALN. Overall doing OK. Bowel fxn slowly returning. UOP excellent, SCr up very slightly.  Will advance diet to FLs Ambulate   LOS: 3 days   Jorja Loa 05/19/2018, 8:18 AM

## 2018-05-20 DIAGNOSIS — N2889 Other specified disorders of kidney and ureter: Secondary | ICD-10-CM

## 2018-05-20 DIAGNOSIS — K567 Ileus, unspecified: Secondary | ICD-10-CM

## 2018-05-20 DIAGNOSIS — I471 Supraventricular tachycardia: Secondary | ICD-10-CM

## 2018-05-20 LAB — CBC
HCT: 32.9 % — ABNORMAL LOW (ref 39.0–52.0)
Hemoglobin: 10.4 g/dL — ABNORMAL LOW (ref 13.0–17.0)
MCH: 28 pg (ref 26.0–34.0)
MCHC: 31.6 g/dL (ref 30.0–36.0)
MCV: 88.7 fL (ref 78.0–100.0)
PLATELETS: 307 10*3/uL (ref 150–400)
RBC: 3.71 MIL/uL — ABNORMAL LOW (ref 4.22–5.81)
RDW: 16.7 % — AB (ref 11.5–15.5)
WBC: 11.1 10*3/uL — AB (ref 4.0–10.5)

## 2018-05-20 LAB — BASIC METABOLIC PANEL
Anion gap: 11 (ref 5–15)
BUN: 49 mg/dL — AB (ref 6–20)
CALCIUM: 8.5 mg/dL — AB (ref 8.9–10.3)
CO2: 22 mmol/L (ref 22–32)
CREATININE: 3.71 mg/dL — AB (ref 0.61–1.24)
Chloride: 105 mmol/L (ref 98–111)
GFR calc Af Amer: 19 mL/min — ABNORMAL LOW (ref >60)
GFR, EST NON AFRICAN AMERICAN: 16 mL/min — AB (ref >60)
GLUCOSE: 106 mg/dL — AB (ref 70–99)
POTASSIUM: 4.9 mmol/L (ref 3.5–5.1)
SODIUM: 138 mmol/L (ref 135–145)

## 2018-05-20 LAB — MAGNESIUM: MAGNESIUM: 1.9 mg/dL (ref 1.7–2.4)

## 2018-05-20 NOTE — Progress Notes (Signed)
PROGRESS NOTE    George Lewis.  JJK:[id number] DOB: 1956-05-15 DOA: 05/16/2018 PCP: Pc, [facility] Brief Narrative: 62 year old male with history of morbid obesity, OSA, chronic kidney disease stage IV with a baseline creatinine of 2.7, hypertension, who was admitted on urology service on 9/18 after being found to have suspected RCC and is status post right nephrectomy. He was recently admitted to the hospital and discharged in August 2019 with sepsis due to UTI as well as bacteremia with enterococcus. ID was consulted, and completed treatment with Zyvox. Patient has been doing fairly well postop, has had worsening of his renal function as well as persistent hyperkalemia and nephrology has been consulted and following. Overnight patient has had 2 episodes of nonsustained V. tach, 4-5 beats, and medicine was consulted this morning. On my evaluation patient is complaining of abdominal distention and discomfort, as well as hiccups, he tells me he has not passed any gas or has had no bowel movement for the past 3 days. He has a poor appetite and was barely able to eat, but denies any discomfort, nausea or vomiting with p.o. intake. He denies any chest pain, denies any palpitations. He has no shortness of breath. He denies any fevers or chills Assessment & Plan:   Active Problems:   Renal mass, right   1 NSVT no further episodes of SVT.  Potassium down to 4.9.  Magnesium 1.9.  Echo shows normal left ventricular systolic function, mild diastolic dysfunction, ejection fraction 50 to 55%.  2 ileus patient had a bowel movement this morning and is passing gas.  Need to start diet and advance diet as tolerated.  DC morphine continue PRN oxycodone.  3 AKI on CKD stage IV slowly improving.  4 obstructive sleep apnea continue CPAP.  5 hypertension currently on metoprolol 25 mg twice a day continue.  6 right renal mass status post nephrectomy.   DVT prophylaxis:  Heparin Code Status  full code Family Communication: Discussed with wife who was in the room Disposition Plan:  If renal functions improving and he is able to tolerate diet hopefully he can be discharged in 24 to 48 hours.  Urology is primary.:    Procedures: Right nephrectomy Antimicrobials: None  Subjective: Up in the chair legs elevated wife by the bedside had a bowel movement complaining of weakness being in bed.   Objective: Vitals:   05/19/18 2113 05/20/18 0529 05/20/18 0607 05/20/18 1208  BP:  123/65  126/82  Pulse:  71  84  Resp: 18 16  16   Temp:  98.8 F (37.1 C)  98.7 F (37.1 C)  TempSrc:  Oral  Oral  SpO2:  97%  99%  Weight:   (!) 143.5 kg   Height:        Intake/Output Summary (Last 24 hours) at 05/20/2018 1229 Last data filed at 05/20/2018 0700 Gross per 24 hour  Intake 240 ml  Output 2050 ml  Net -1810 ml   Filed Weights   05/18/18 1745 05/19/18 0545 05/20/18 0607  Weight: (!) 142.1 kg (!) 143.7 kg (!) 143.5 kg    Examination:  General exam: Appears calm and comfortable  Respiratory system: Clear to auscultation. Respiratory effort normal. Cardiovascular system: S1 & S2 heard, RRR. No JVD, murmurs, rubs, gallops or clicks. No pedal edema. Gastrointestinal system: Abdomen is distended, soft and nontender. No organomegaly or masses felt. Normal bowel sounds heard. Central nervous system: Alert and oriented. No focal neurological deficits. Extremities: Symmetric 5 x 5  power. Skin: No rashes, lesions or ulcers Psychiatry: Judgement and insight appear normal. Mood & affect appropriate.     Data Reviewed: I have personally reviewed following labs and imaging studies  CBC: Recent Labs  Lab 05/16/18 1139 05/17/18 0335 05/18/18 0756 05/20/18 0515  WBC  --  14.1* 13.5* 11.1*  HGB 10.9* 10.4* 11.3* 10.4*  HCT 34.3* 32.6* 34.7* 32.9*  MCV  --  88.8 86.5 88.7  PLT  --  235 264 694   Basic Metabolic Panel: Recent Labs  Lab 05/17/18 0335  05/17/18 0837 05/17/18 1447 05/18/18 0519 05/18/18 0812 05/19/18 0533 05/20/18 0515  NA 137 138  --  141  --  139 138  K 6.3* 6.4* 5.6* 5.5*  --  5.1 4.9  CL 106 109  --  107  --  103 105  CO2 20* 14*  --  21*  --  25 22  GLUCOSE 209* 185*  --  128*  --  127* 106*  BUN 37* 40*  --  46*  --  48* 49*  CREATININE 3.12* 3.31*  --  3.66*  --  3.86* 3.71*  CALCIUM 8.4* 8.6*  --  9.1  --  8.6* 8.5*  MG  --   --   --   --  1.6*  --  1.9   GFR: Estimated Creatinine Clearance: 31.9 mL/min (A) (by C-G formula based on SCr of 3.71 mg/dL (H)). Liver Function Tests: No results for input(s): AST, ALT, ALKPHOS, BILITOT, PROT, ALBUMIN in the last 168 hours. No results for input(s): LIPASE, AMYLASE in the last 168 hours. No results for input(s): AMMONIA in the last 168 hours. Coagulation Profile: No results for input(s): INR, PROTIME in the last 168 hours. Cardiac Enzymes: No results for input(s): CKTOTAL, CKMB, CKMBINDEX, TROPONINI in the last 168 hours. BNP (last 3 results) No results for input(s): PROBNP in the last 8760 hours. HbA1C: No results for input(s): HGBA1C in the last 72 hours. CBG: No results for input(s): GLUCAP in the last 168 hours. Lipid Profile: No results for input(s): CHOL, HDL, LDLCALC, TRIG, CHOLHDL, LDLDIRECT in the last 72 hours. Thyroid Function Tests: No results for input(s): TSH, T4TOTAL, FREET4, T3FREE, THYROIDAB in the last 72 hours. Anemia Panel: No results for input(s): VITAMINB12, FOLATE, FERRITIN, TIBC, IRON, RETICCTPCT in the last 72 hours. Sepsis Labs: No results for input(s): PROCALCITON, LATICACIDVEN in the last 168 hours.  No results found for this or any previous visit (from the past 240 hour(s)).       Radiology Studies: No results found.      Scheduled Meds: . allopurinol  300 mg Oral QPM  . heparin  5,000 Units Subcutaneous Q8H  . loratadine  10 mg Oral QPM  . mouth rinse  15 mL Mouth Rinse BID  . metoprolol tartrate  25 mg Oral BID   . pantoprazole  80 mg Oral QPM  . pneumococcal 23 valent vaccine  0.5 mL Intramuscular Tomorrow-1000  . polyethylene glycol  17 g Oral BID  . senna-docusate  2 tablet Oral BID  . tamsulosin  0.4 mg Oral QPM   Continuous Infusions: . sodium chloride 50 mL/hr at 05/19/18 1758     LOS: 4 days     Georgette Shell, MD [facility]  If 7PM-7AM, please contact night-coverage [email or web address] Password [facility] 05/20/2018, 12:29 PM

## 2018-05-20 NOTE — Progress Notes (Signed)
4 Days Post-Op Subjective: Patient reports feeling much better--passing gas, no abd pain (was on way to [facility] For KUB).  Objective: Vital signs in last 24 hours: Temp:  [98.8 F (37.1 C)-99.9 F (37.7 C)] 98.8 F (37.1 C) (09/22 0529) Pulse Rate:  [71-102] 71 (09/22 0529) Resp:  [16-18] 16 (09/22 0529) BP: (123-156)/(65-87) 123/65 (09/22 0529) SpO2:  [96 %-97 %] 97 % (09/22 0529) Weight:  [143.5 kg] 143.5 kg (09/22 0607)  Intake/Output from previous day: 09/21 0701 - 09/22 0700 In: 240 [P.O.:240] Out: 2700 [Urine:2700] Intake/Output this shift: No intake/output data recorded.  Physical Exam:  Constitutional: Vital signs reviewed. WD WN in NAD   Eyes: PERRL, No scleral icterus.   Cardiovascular: RRR Pulmonary/Chest: Normal effort Abdominal: Soft. Less tympany. Appropriately tender.    Lab Results: Recent Labs    05/18/18 0756 05/20/18 0515  HGB 11.3* 10.4*  HCT 34.7* 32.9*   BMET Recent Labs    05/19/18 0533 05/20/18 0515  NA 139 138  K 5.1 4.9  CL 103 105  CO2 25 22  GLUCOSE 127* 106*  BUN 48* 49*  CREATININE 3.86* 3.71*  CALCIUM 8.6* 8.5*   No results for input(s): LABPT, INR in the last 72 hours. No results for input(s): LABURIN in the last 72 hours. Results for orders placed or performed during the hospital encounter of 04/19/18  Blood Culture (routine x 2)     Status: Abnormal   Collection Time: 04/19/18  3:25 PM  Result Value Ref Range Status   Specimen Description BLOOD RIGHT HAND  Final   Special Requests   Final    BOTTLES DRAWN AEROBIC AND ANAEROBIC Blood Culture adequate volume   Culture  Setup Time   Final    GRAM POSITIVE COCCI AEROBIC BOTTLE ONLY CRITICAL RESULT CALLED TO, READ BACK BY AND VERIFIED WITH: B.GREEN,PHARMD AT 0510 ON 04/22/18 BY G.MCADOO Performed at [facility], [place] [address], [place], [place] [address]    Culture ENTEROCOCCUS FAECALIS (A)  Final   Report Status 04/24/2018 FINAL  Final   Organism ID, Bacteria  ENTEROCOCCUS FAECALIS  Final      Susceptibility   Enterococcus faecalis - MIC*    AMPICILLIN <=2 SENSITIVE Sensitive     VANCOMYCIN 1 SENSITIVE Sensitive     GENTAMICIN SYNERGY SENSITIVE Sensitive     * ENTEROCOCCUS FAECALIS  Blood Culture ID Panel (Reflexed)     Status: Abnormal   Collection Time: 04/19/18  3:25 PM  Result Value Ref Range Status   Enterococcus species DETECTED (A) NOT DETECTED Final    Comment: CRITICAL RESULT CALLED TO, READ BACK BY AND VERIFIED WITH: B.GREEN,PHARMD AT 0510 ON 04/22/18 BY G.MCADOO    Vancomycin resistance NOT DETECTED NOT DETECTED Final   Listeria monocytogenes NOT DETECTED NOT DETECTED Final   Staphylococcus species NOT DETECTED NOT DETECTED Final   Staphylococcus aureus NOT DETECTED NOT DETECTED Final   Streptococcus species NOT DETECTED NOT DETECTED Final   Streptococcus agalactiae NOT DETECTED NOT DETECTED Final   Streptococcus pneumoniae NOT DETECTED NOT DETECTED Final   Streptococcus pyogenes NOT DETECTED NOT DETECTED Final   Acinetobacter baumannii NOT DETECTED NOT DETECTED Final   Enterobacteriaceae species NOT DETECTED NOT DETECTED Final   Enterobacter cloacae complex NOT DETECTED NOT DETECTED Final   Escherichia coli NOT DETECTED NOT DETECTED Final   Klebsiella oxytoca NOT DETECTED NOT DETECTED Final   Klebsiella pneumoniae NOT DETECTED NOT DETECTED Final   Proteus species NOT DETECTED NOT DETECTED Final  Serratia marcescens NOT DETECTED NOT DETECTED Final   Haemophilus influenzae NOT DETECTED NOT DETECTED Final   Neisseria meningitidis NOT DETECTED NOT DETECTED Final   Pseudomonas aeruginosa NOT DETECTED NOT DETECTED Final   Candida albicans NOT DETECTED NOT DETECTED Final   Candida glabrata NOT DETECTED NOT DETECTED Final   Candida krusei NOT DETECTED NOT DETECTED Final   Candida parapsilosis NOT DETECTED NOT DETECTED Final   Candida tropicalis NOT DETECTED NOT DETECTED Final    Comment: Performed at [facility], [place] [address] [place], [place] [address]  Urine culture     Status: Abnormal   Collection Time: 04/19/18  6:01 PM  Result Value Ref Range Status   Specimen Description   Final    URINE, CLEAN CATCH Performed at [facility], [place] [address], [place], [place] [address]    Special Requests   Final    NONE Performed at [facility], [place] [address], [place], [place] [address]    Culture MULTIPLE SPECIES PRESENT, SUGGEST RECOLLECTION (A)  Final   Report Status 04/21/2018 FINAL  Final  MRSA PCR Screening     Status: None   Collection Time: 04/19/18  7:06 PM  Result Value Ref Range Status   MRSA by PCR NEGATIVE NEGATIVE Final    Comment:        The GeneXpert MRSA Assay (FDA approved for NASAL specimens only), is one component of a comprehensive MRSA colonization surveillance program. It is not intended to diagnose MRSA infection nor to guide or monitor treatment for MRSA infections. Performed at [facility], [place] [address], [place], [place] [address]   Blood Culture (routine x 2)     Status: None   Collection Time: 04/19/18  8:09 PM  Result Value Ref Range Status   Specimen Description   Final    BLOOD RIGHT ANTECUBITAL Performed at [place] [address] [place], [place] [address]    Special Requests   Final    BOTTLES DRAWN AEROBIC AND ANAEROBIC Blood Culture adequate volume Performed at [place] [address] [place], [place] [address]    Culture   Final    NO GROWTH 5 DAYS Performed at [facility], [place] [address] [place], [place] [address]    Report Status 04/25/2018 FINAL  Final  Culture, blood (routine x 2)     Status: None   Collection Time: 04/22/18  1:05 PM  Result Value Ref Range Status   Specimen Description BLOOD RIGHT HAND  Final   Special Requests   Final    BOTTLES DRAWN AEROBIC AND ANAEROBIC Blood Culture adequate volume   Culture   Final    NO  GROWTH 5 DAYS Performed at [facility], [place] [address] [place], [place] [address]    Report Status 04/27/2018 FINAL  Final  Culture, blood (routine x 2)     Status: None   Collection Time: 04/22/18  1:07 PM  Result Value Ref Range Status   Specimen Description BLOOD RIGHT ARM  Final   Special Requests   Final    BOTTLES DRAWN AEROBIC ONLY Blood Culture adequate volume   Culture   Final    NO GROWTH 5 DAYS Performed at [facility], [place] [address] [place], [place] [address]    Report Status 04/27/2018 FINAL  Final    Studies/Results: Dg Abd Portable 2v  Result Date: 05/18/2018 CLINICAL DATA:  Abdominal  distention.  No bowel movement in 3 days. EXAM: PORTABLE ABDOMEN - 2 VIEW COMPARISON:  CT 04/19/2018 FINDINGS: Gaseous distention of both large and small bowel loops. Rectosigmoid colon less dilated. No free air organomegaly. No acute bony abnormality. IMPRESSION: Dilated large and small bowel loops in the abdomen and pelvis. Favor ileus. Electronically Signed   By: Rolm Baptise M.D.   On: 05/18/2018 09:37    Assessment/Plan:   POD 4  Rt HALNeph. Bowel fxn returning, SCr better slightlly  Reg. Diet. I cancelled KUB OKK for d/c from GU standpoint--will defer d/c to med staff   LOS: 4 days   George Lewis 05/20/2018, 8:30 AM

## 2018-05-20 NOTE — Progress Notes (Signed)
[name] Progress Note  Subjective: feeling a lot better, walked in the halls, n/v better, drinking w/o difficulty. Creat down today 0.2 mg/dL  Vitals:   05/19/18 2113 05/20/18 0529 05/20/18 0607 05/20/18 1208  BP:  123/65  126/82  Pulse:  71  84  Resp: 18 16  16   Temp:  98.8 F (37.1 C)  98.7 F (37.1 C)  TempSrc:  Oral  Oral  SpO2:  97%  99%  Weight:   (!) 143.5 kg   Height:        Inpatient medications: . allopurinol  300 mg Oral QPM  . heparin  5,000 Units Subcutaneous Q8H  . loratadine  10 mg Oral QPM  . mouth rinse  15 mL Mouth Rinse BID  . metoprolol tartrate  25 mg Oral BID  . pantoprazole  80 mg Oral QPM  . pneumococcal 23 valent vaccine  0.5 mL Intramuscular Tomorrow-1000  . polyethylene glycol  17 g Oral BID  . senna-docusate  2 tablet Oral BID  . tamsulosin  0.4 mg Oral QPM   . sodium chloride 50 mL/hr at 05/19/18 1758   acetaminophen, neomycin-bacitracin-polymyxin, ondansetron (ZOFRAN) IV, oxyCODONE  Iron/TIBC/Ferritin/ %Sat    Component Value Date/Time   IRON 19 (L) 04/20/2018 0339   TIBC 260 04/20/2018 0339   FERRITIN 274 04/20/2018 0339   IRONPCTSAT 7 (L) 04/20/2018 0339    Home meds:  - allopurinol 300 hs/ loratadine 10 hs/ omeprazole 40 qd/ tamsulosin 0.4 qd  - oxycodone -acetaminophen 5-325 qid prn/ hydrocodone - aceta prn  - metoprolol tartrate 25 bid/ potassium citrate 15 mEq qhs   Exam: Gen obese, pleasant, nasal O2, no distress No jvd or bruits Chest clear bilat , dec'd at bases RRR no MRG Abd soft distended, tympanitic,  no mass or ascites,+ midline dressing GU normal male  Ext 1+ LE edema Neuro is alert, Ox 3 , nf   Impression: 1. 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  Baseline creat 2.7. Creat down today 3.7 from 3.8.  No new suggestions, patient will sched appt at [facility] in 1-2 mos after dc.  Will sign off.   2. Hyperkalemia - cont low K+ diet. 3. Post-op ileus - resolving 4. SP hand-assisted  lap R nephrectomy 9/18- for R renal mass 5. HTN - getting home metop 25 bid 6. Volume - mild vol excess, not grossly overloaded   Plan - as above   Kelly Splinter MD [employer] pager [phone]   05/20/2018, 1:59 PM   Recent Labs  Lab 05/19/18 0533 05/20/18 0515  NA 139 138  K 5.1 4.9  CL 103 105  CO2 25 22  GLUCOSE 127* 106*  BUN 48* 49*  CREATININE 3.86* 3.71*  CALCIUM 8.6* 8.5*   No results for input(s): AST, ALT, ALKPHOS, BILITOT, PROT in the last 168 hours. Recent Labs  Lab 05/18/18 0756 05/20/18 0515  WBC 13.5* 11.1*  HGB 11.3* 10.4*  HCT 34.7* 32.9*  MCV 86.5 88.7  PLT 264 307

## 2018-05-21 LAB — BASIC METABOLIC PANEL
Anion gap: 9 (ref 5–15)
BUN: 47 mg/dL — AB (ref 6–20)
CALCIUM: 8.4 mg/dL — AB (ref 8.9–10.3)
CHLORIDE: 107 mmol/L (ref 98–111)
CO2: 23 mmol/L (ref 22–32)
CREATININE: 3.73 mg/dL — AB (ref 0.61–1.24)
GFR, EST AFRICAN AMERICAN: 19 mL/min — AB (ref >60)
GFR, EST NON AFRICAN AMERICAN: 16 mL/min — AB (ref >60)
Glucose, Bld: 109 mg/dL — ABNORMAL HIGH (ref 70–99)
Potassium: 4.8 mmol/L (ref 3.5–5.1)
Sodium: 139 mmol/L (ref 135–145)

## 2018-05-21 MED ORDER — SENNOSIDES-DOCUSATE SODIUM 8.6-50 MG PO TABS: 1.0000 | ORAL_TABLET | Freq: Every day | ORAL | 1 refills | Status: DC

## 2018-05-21 MED ORDER — OXYCODONE HCL 5 MG PO TABS: 5.0000 mg | ORAL_TABLET | Freq: Three times a day (TID) | ORAL | 0 refills | Status: AC | PRN

## 2018-05-21 NOTE — Discharge Summary (Addendum)
[facility] Discharge Summary  Admit date: 05/16/2018  Discharge date and time: 05/21/18   Discharge to: Home  Discharge Service: Urology  Discharge Attending Physician:  Dr. Gloriann Loan  Discharge  Diagnoses: <principal problem not specified>  Secondary Diagnosis: Active Problems:   Renal mass, right   Ileus ([place])   SVT (supraventricular tachycardia) ([place])   OR Procedures: Procedure(s): HAND ASSISTED LAPAROSCOPIC RIGHT NEPHRECTOMY 05/16/2018   Ancillary Procedures: None   Discharge Day Services: The patient was seen and examined by the Urology team both in the morning and immediately prior to discharge.  Vital signs and laboratory values were stable and within normal limits.  The physical exam was benign and unchanged and all surgical wounds were examined.  Discharge instructions were explained and all questions answered.  Subjective  No acute events overnight. Pain Controlled. No fever or chills.  Objective Patient Vitals for the past 8 hrs:  BP Temp Temp src Pulse Resp SpO2 Weight  05/21/18 0531 123/74 98.3 F (36.8 C) Oral 76 18 97 % (!) 142.8 kg   No intake/output data recorded.  General Appearance:        No acute distress Lungs:                       Normal work of breathing on room air Heart:                                Regular rate and rhythm Abdomen:                         Soft, non-tender, non-distended. Incisions stapled including midline, healing well.  Extremities:                      Warm and well perfused   Hospital Course:  The patient underwent a right HA laparoscopic radical nephrecotmy on 05/16/2018.  The patient tolerated the procedure well, was extubated in the OR, and afterwards was taken to the PACU for routine post-surgical care. When stable the patient was transferred to the floor.The patient did well postoperatively. Nephrology team was helpful in consultation given his history of baseline CKD and now solitary kidney. Hyperkalemia and fluid  balances were initially treated the first 2 days post operatively. His creatinine stabilized near the 3.7 range. The patient's diet was slowly advanced and at the time of discharge was tolerating a regular diet. He did have some mild post-op ileus on POD2-3 although began passing gas and having BM. Medicine team was consulted due to intermittent short runs of Vtach without chest symptoms. Echocardiogram was normal.  The patient was discharged home 5 Days Post-Op, at which point was tolerating a regular solid diet, was able to void spontaneously, have adequate pain control with P.O. pain medication, and could ambulate without difficulty. The patient will follow up with [place] for post op check.  He will follow up with nephrology as outpatient He will follow up with PCP regarding intermittent arrhthymias.    Condition at Discharge: Improved  Discharge Medications:  Allergies as of 05/21/2018      Reactions   Penicillins Rash, Other (See Comments)   Has patient had a PCN reaction causing immediate rash, facial/tongue/throat swelling, SOB or lightheadedness with hypotension: yes Has patient had a PCN reaction causing severe rash involving mucus membranes or skin necrosis: no Has patient had a PCN reaction that  required hospitalization: no Has patient had a PCN reaction occurring within the last 10 years: no If all of the above answers are "NO", then may proceed with Cephalosporin use.      Medication List    TAKE these medications   acetaminophen 500 MG tablet Commonly known as:  TYLENOL Take 500-1,000 mg by mouth every 8 (eight) hours as needed for mild pain.   allopurinol 300 MG tablet Commonly known as:  ZYLOPRIM Take 300 mg by mouth every evening.   diphenhydramine-acetaminophen 25-500 MG Tabs tablet Commonly known as:  TYLENOL PM Take 1 tablet by mouth at bedtime as needed (sleep).   fluticasone 50 MCG/ACT nasal spray Commonly known as:  FLONASE Place 2 sprays into both nostrils daily  as needed for allergies or rhinitis.   HYDROcodone-acetaminophen 5-325 MG tablet Commonly known as:  NORCO/VICODIN Take 1 tablet by mouth every 4 (four) hours as needed for moderate pain.   loratadine 10 MG tablet Commonly known as:  CLARITIN Take 10 mg by mouth every evening.   metoprolol tartrate 25 MG tablet Commonly known as:  LOPRESSOR Take 1 tablet (25 mg total) by mouth 2 (two) times daily.   omeprazole 20 MG tablet Commonly known as:  PRILOSEC OTC Take 40 mg by mouth every evening.   oxyCODONE 5 MG immediate release tablet Commonly known as:  Oxy IR/ROXICODONE Take 1 tablet (5 mg total) by mouth every 8 (eight) hours as needed for up to 20 days.   oxyCODONE-acetaminophen 5-325 MG tablet Commonly known as:  PERCOCET/ROXICET Take 1 tablet by mouth every 6 (six) hours as needed for moderate pain.   Potassium Citrate 15 MEQ (1620 MG) Tbcr Take 1 tablet by mouth every evening.   senna-docusate 8.6-50 MG tablet Commonly known as:  Senokot-S Take 1 tablet by mouth daily.   tamsulosin 0.4 MG Caps capsule Commonly known as:  FLOMAX Take 0.4 mg by mouth every evening.

## 2018-05-21 NOTE — Progress Notes (Signed)
Patient had a 7 beat run of vtach. Pt asymptomatic. VSS. NP on call notified. No new orders placed

## 2018-05-21 NOTE — Care Management Note (Signed)
Case Management Note  Patient Details  Name: George Lewis. MRN: [id number] Date of Birth: 02/11/1956  Subjective/Objective: Patient agreed to HHPT-chose [place] aware. No further CM needs.                   Action/Plan:d/c home w/HHC.   Expected Discharge Date:  05/21/18               Expected Discharge Plan:  [place]  In-House Referral:     Discharge planning Services  CM Consult  Post Acute Care Choice:    Choice offered to:  Patient  DME Arranged:    DME Agency:     HH Arranged:  PT [place]:  [place]  Status of Service:  Completed, signed off  If discussed at [place] of Stay Meetings, dates discussed:    Additional Comments:  Dessa Phi, RN 05/21/2018, 10:41 AM

## 2018-06-08 ENCOUNTER — Ambulatory Visit (HOSPITAL_COMMUNITY)
Admission: RE | Admit: 2018-06-08 | Discharge: 2018-06-08 | Disposition: A | Discharge status: Home / Self Care | Payer: PRIVATE HEALTH INSURANCE | Source: Ambulatory Visit | Attending: Vascular Surgery | Admitting: Vascular Surgery

## 2018-06-08 DIAGNOSIS — N189 Chronic kidney disease, unspecified: Secondary | ICD-10-CM | POA: Insufficient documentation

## 2018-06-12 ENCOUNTER — Observation Stay (HOSPITAL_COMMUNITY)
Admission: EM | Admit: 2018-06-12 | Discharge: 2018-06-13 | Disposition: A | Discharge status: Home / Self Care | Payer: PRIVATE HEALTH INSURANCE | Attending: Internal Medicine | Admitting: Internal Medicine

## 2018-06-12 ENCOUNTER — Other Ambulatory Visit: Payer: Self-pay

## 2018-06-12 ENCOUNTER — Emergency Department (HOSPITAL_COMMUNITY): Payer: PRIVATE HEALTH INSURANCE

## 2018-06-12 ENCOUNTER — Ambulatory Visit (INDEPENDENT_AMBULATORY_CARE_PROVIDER_SITE_OTHER): Payer: PRIVATE HEALTH INSURANCE | Admitting: Vascular Surgery

## 2018-06-12 ENCOUNTER — Encounter (HOSPITAL_COMMUNITY): Payer: Self-pay | Admitting: Emergency Medicine

## 2018-06-12 ENCOUNTER — Other Ambulatory Visit: Payer: Self-pay | Admitting: *Deleted

## 2018-06-12 ENCOUNTER — Encounter: Payer: Self-pay | Admitting: Vascular Surgery

## 2018-06-12 VITALS — BP 108/68 | HR 102 | Temp 98.9°F | Resp 18 | Ht 73.0 in | Wt 307.0 lb

## 2018-06-12 DIAGNOSIS — G4733 Obstructive sleep apnea (adult) (pediatric): Secondary | ICD-10-CM | POA: Insufficient documentation

## 2018-06-12 DIAGNOSIS — Z85528 Personal history of other malignant neoplasm of kidney: Secondary | ICD-10-CM | POA: Diagnosis not present

## 2018-06-12 DIAGNOSIS — Z7951 Long term (current) use of inhaled steroids: Secondary | ICD-10-CM | POA: Insufficient documentation

## 2018-06-12 DIAGNOSIS — I129 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 kidney disease, or unspecified chronic kidney disease: Secondary | ICD-10-CM | POA: Diagnosis not present

## 2018-06-12 DIAGNOSIS — I471 Supraventricular tachycardia: Secondary | ICD-10-CM | POA: Diagnosis not present

## 2018-06-12 DIAGNOSIS — I1310 Hypertensive heart and chronic kidney disease without heart failure, with stage 1 through stage 4 chronic kidney disease, or unspecified chronic kidney disease: Secondary | ICD-10-CM | POA: Insufficient documentation

## 2018-06-12 DIAGNOSIS — M170 Bilateral primary osteoarthritis of knee: Secondary | ICD-10-CM | POA: Insufficient documentation

## 2018-06-12 DIAGNOSIS — Z87442 Personal history of urinary calculi: Secondary | ICD-10-CM | POA: Diagnosis not present

## 2018-06-12 DIAGNOSIS — Z85520 Personal history of malignant carcinoid tumor of kidney: Secondary | ICD-10-CM

## 2018-06-12 DIAGNOSIS — I493 Ventricular premature depolarization: Secondary | ICD-10-CM | POA: Insufficient documentation

## 2018-06-12 DIAGNOSIS — Z86711 Personal history of pulmonary embolism: Secondary | ICD-10-CM

## 2018-06-12 DIAGNOSIS — Z8249 Family history of ischemic heart disease and other diseases of the circulatory system: Secondary | ICD-10-CM | POA: Insufficient documentation

## 2018-06-12 DIAGNOSIS — Z880 Allergy status to penicillin: Secondary | ICD-10-CM | POA: Insufficient documentation

## 2018-06-12 DIAGNOSIS — R55 Syncope and collapse: Secondary | ICD-10-CM | POA: Diagnosis not present

## 2018-06-12 DIAGNOSIS — Z8679 Personal history of other diseases of the circulatory system: Secondary | ICD-10-CM

## 2018-06-12 DIAGNOSIS — Z905 Acquired absence of kidney: Secondary | ICD-10-CM | POA: Insufficient documentation

## 2018-06-12 DIAGNOSIS — Z9989 Dependence on other enabling machines and devices: Secondary | ICD-10-CM

## 2018-06-12 DIAGNOSIS — N184 Chronic kidney disease, stage 4 (severe): Secondary | ICD-10-CM | POA: Insufficient documentation

## 2018-06-12 DIAGNOSIS — K219 Gastro-esophageal reflux disease without esophagitis: Secondary | ICD-10-CM | POA: Insufficient documentation

## 2018-06-12 DIAGNOSIS — Z79899 Other long term (current) drug therapy: Secondary | ICD-10-CM

## 2018-06-12 DIAGNOSIS — I770 Arteriovenous fistula, acquired: Secondary | ICD-10-CM

## 2018-06-12 LAB — CBC WITH DIFFERENTIAL/PLATELET
ABS IMMATURE GRANULOCYTES: 0.29 10*3/uL — AB (ref 0.00–0.07)
Basophils Absolute: 0.1 10*3/uL (ref 0.0–0.1)
Basophils Relative: 1 %
Eosinophils Absolute: 0.3 10*3/uL (ref 0.0–0.5)
Eosinophils Relative: 2 %
HEMATOCRIT: 35.5 % — AB (ref 39.0–52.0)
HEMOGLOBIN: 11 g/dL — AB (ref 13.0–17.0)
Immature Granulocytes: 2 %
LYMPHS ABS: 1.5 10*3/uL (ref 0.7–4.0)
LYMPHS PCT: 11 %
MCH: 27.4 pg (ref 26.0–34.0)
MCHC: 31 g/dL (ref 30.0–36.0)
MCV: 88.5 fL (ref 80.0–100.0)
MONO ABS: 0.8 10*3/uL (ref 0.1–1.0)
MONOS PCT: 6 %
NRBC: 0 % (ref 0.0–0.2)
Neutro Abs: 10.6 10*3/uL — ABNORMAL HIGH (ref 1.7–7.7)
Neutrophils Relative %: 78 %
Platelets: 320 10*3/uL (ref 150–400)
RBC: 4.01 MIL/uL — ABNORMAL LOW (ref 4.22–5.81)
RDW: 16.7 % — ABNORMAL HIGH (ref 11.5–15.5)
WBC: 13.5 10*3/uL — AB (ref 4.0–10.5)

## 2018-06-12 LAB — URINALYSIS, ROUTINE W REFLEX MICROSCOPIC
BILIRUBIN URINE: NEGATIVE
GLUCOSE, UA: NEGATIVE mg/dL
KETONES UR: NEGATIVE mg/dL
Leukocytes, UA: NEGATIVE
NITRITE: NEGATIVE
Protein, ur: 100 mg/dL — AB
Specific Gravity, Urine: 1.01 (ref 1.005–1.030)
pH: 5 (ref 5.0–8.0)

## 2018-06-12 LAB — COMPREHENSIVE METABOLIC PANEL
ALBUMIN: 3.4 g/dL — AB (ref 3.5–5.0)
ALT: 19 U/L (ref 0–44)
ANION GAP: 10 (ref 5–15)
AST: 29 U/L (ref 15–41)
Alkaline Phosphatase: 68 U/L (ref 38–126)
BUN: 33 mg/dL — ABNORMAL HIGH (ref 6–20)
CHLORIDE: 108 mmol/L (ref 98–111)
CO2: 18 mmol/L — ABNORMAL LOW (ref 22–32)
Calcium: 9.2 mg/dL (ref 8.9–10.3)
Creatinine, Ser: 3.83 mg/dL — ABNORMAL HIGH (ref 0.61–1.24)
GFR calc Af Amer: 18 mL/min — ABNORMAL LOW (ref >60)
GFR calc non Af Amer: 16 mL/min — ABNORMAL LOW (ref >60)
GLUCOSE: 116 mg/dL — AB (ref 70–99)
POTASSIUM: 5.1 mmol/L (ref 3.5–5.1)
SODIUM: 136 mmol/L (ref 135–145)
Total Bilirubin: 0.5 mg/dL (ref 0.3–1.2)
Total Protein: 7.2 g/dL (ref 6.5–8.1)

## 2018-06-12 LAB — TROPONIN I: Troponin I: <0.03 ng/mL (ref <0.03)

## 2018-06-12 MED ORDER — LORATADINE 10 MG PO TABS
10.0000 mg | ORAL_TABLET | Freq: Every evening | ORAL | Status: DC
  Administered 2018-06-12: 10 mg via ORAL
  Filled 2018-06-12: qty 1

## 2018-06-12 MED ORDER — SODIUM CHLORIDE 0.9 % IV BOLUS
500.0000 mL | Freq: Once | INTRAVENOUS | Status: AC
  Administered 2018-06-12: 500 mL via INTRAVENOUS

## 2018-06-12 MED ORDER — ACETAMINOPHEN 650 MG RE SUPP: 650.0000 mg | Freq: Four times a day (QID) | RECTAL | Status: DC | PRN

## 2018-06-12 MED ORDER — TAMSULOSIN HCL 0.4 MG PO CAPS
0.4000 mg | ORAL_CAPSULE | Freq: Every evening | ORAL | Status: DC
  Administered 2018-06-12: 0.4 mg via ORAL
  Filled 2018-06-12: qty 1

## 2018-06-12 MED ORDER — SENNOSIDES-DOCUSATE SODIUM 8.6-50 MG PO TABS
1.0000 | ORAL_TABLET | Freq: Every day | ORAL | Status: DC
  Filled 2018-06-12: qty 1

## 2018-06-12 MED ORDER — ALLOPURINOL 300 MG PO TABS
300.0000 mg | ORAL_TABLET | Freq: Every evening | ORAL | Status: DC
  Administered 2018-06-12: 300 mg via ORAL
  Filled 2018-06-12: qty 1

## 2018-06-12 MED ORDER — OMEPRAZOLE MAGNESIUM 20 MG PO TBEC: 40.0000 mg | DELAYED_RELEASE_TABLET | Freq: Every evening | ORAL | Status: DC

## 2018-06-12 MED ORDER — PANTOPRAZOLE SODIUM 40 MG PO TBEC
40.0000 mg | DELAYED_RELEASE_TABLET | Freq: Every day | ORAL | Status: DC
  Administered 2018-06-12: 40 mg via ORAL
  Filled 2018-06-12: qty 1

## 2018-06-12 MED ORDER — ACETAMINOPHEN 325 MG PO TABS
650.0000 mg | ORAL_TABLET | Freq: Four times a day (QID) | ORAL | Status: DC | PRN
  Administered 2018-06-12: 650 mg via ORAL
  Filled 2018-06-12: qty 2

## 2018-06-12 MED ORDER — PANTOPRAZOLE SODIUM 40 MG IV SOLR
40.0000 mg | Freq: Once | INTRAVENOUS | Status: AC
  Administered 2018-06-12: 40 mg via INTRAVENOUS
  Filled 2018-06-12: qty 40

## 2018-06-12 MED ORDER — FLUTICASONE PROPIONATE 50 MCG/ACT NA SUSP
2.0000 | Freq: Every day | NASAL | Status: DC | PRN
  Filled 2018-06-12: qty 16

## 2018-06-12 MED ORDER — HEPARIN SODIUM (PORCINE) 5000 UNIT/ML IJ SOLN
5000.0000 [IU] | Freq: Three times a day (TID) | INTRAMUSCULAR | Status: DC
  Administered 2018-06-12 – 2018-06-13 (×3): 5000 [IU] via SUBCUTANEOUS
  Filled 2018-06-12 (×3): qty 1

## 2018-06-12 NOTE — ED Triage Notes (Signed)
Pt [facility], had a syncopal event while talking to the doctor. Pt became lightheaded, passed out for approx 2-4 min. Pt was pale, diaphoretic. EKG showed SR. CBG 117. No heart hx. Had kidney removed 3 weeks ago. About to begin dialysis. There for fistula followup. Pt alert, oriented x4. Normally able to ambulate independently. Vitals 135/81, HR 100, 16RR, 98% RA. 20R hand , left arm restricted.

## 2018-06-12 NOTE — Progress Notes (Signed)
Patient name: George Lewis. MRN: [id number] DOB: 1956-08-14 Sex: male  REASON FOR VISIT: Postop follow-up status post left radiocephalic fistula  HPI: George Moffa. is a 62 y.o. male with history of stage IV chronic kidney disease status post recent nephrectomy that presents for postop follow-up.  We performed a left radiocephalic AV fistula on 11/04/1827.  On evaluation today he states the fistula still has a thrill.  He has no complaints of numbness tingling or weakness in the left hand.  He did get a fistula duplex prior to his appointment.  No complaints with the incision.  Past Medical History:  Diagnosis Date  . Arthritis    knees  . Bilateral renal cysts 03/23/2018   Noted MRI ABD  . Cholelithiasis 03/19/2018   noted on CT AB/Pelvis  . CKD (chronic kidney disease), stage IV [facility])    nephrologist-- dr Hollie Salk [name]);  05-01-2018 has not started dialysis, scheduled for AV fistula creation 05-07-2018  . Diverticulosis of colon 04/19/2018   Noted on CT abd/pelvis  . GERD (gastroesophageal reflux disease)   . Hepatic steatosis 03/19/2018   Mild diffuse, noted on CT AB/Pelvis  . History of kidney stones   . History of pulmonary embolus (PE) 03/2008   treated with coumadin for 6 months  . History of sepsis 04/20/2018   per discharge note , probable UTI  . Hypertension   . Nocturia   . OSA on CPAP   . Pancreatic lesion    0.4cm cystic per CT 07/ 2019  . Pneumonia   . Pulmonary nodule    Solid 65mm Right Lower Lobe  . Right renal mass 04/10/2018   new dx--  scheduled for nephrectomy 05-16-2018  . S/P ureteral stent placement: 04/16/2018 04/19/2018    Past Surgical History:  Procedure Laterality Date  . AV FISTULA PLACEMENT Left 05/07/2018   Procedure: Creation of Left arm Radiocephalic Fistula;  Surgeon: Marty Heck, MD;  Location: [place];  Service: Vascular;  Laterality: Left;  . CYSTOSCOPY/RETROGRADE/URETEROSCOPY/STONE EXTRACTION WITH BASKET  x2  last one 1990s approx.  . CYSTOSCOPY/URETEROSCOPY/HOLMIUM LASER/STENT PLACEMENT Left 04/16/2018   Procedure: CYSTOSCOPY/LEFT URETEROSCOPY/LEFT RETROGRADE/STENT PLACEMENT;  Surgeon: Lucas Mallow, MD;  [facility];  Service: Urology;  Laterality: Left;  . EUS N/A 05/03/2018   Procedure: UPPER ENDOSCOPIC ULTRASOUND (EUS) RADIAL;  Surgeon: Milus Banister, MD;  [facility];  Service: Gastroenterology;  Laterality: N/A;  . EUS N/A 05/03/2018   Procedure: UPPER ENDOSCOPIC ULTRASOUND (EUS) LINEAR;  Surgeon: Milus Banister, MD;  [facility];  Service: Gastroenterology;  Laterality: N/A;  . EXTRACORPOREAL SHOCK WAVE LITHOTRIPSY  x3  last one 2004 approx.  Marland Kitchen FINE NEEDLE ASPIRATION N/A 05/03/2018   Procedure: FINE NEEDLE ASPIRATION (FNA) LINEAR;  Surgeon: Milus Banister, MD;  [facility];  Service: Gastroenterology;  Laterality: N/A;  . LAPAROSCOPIC NEPHRECTOMY, HAND ASSISTED Right 05/16/2018   Procedure: HAND ASSISTED LAPAROSCOPIC RIGHT NEPHRECTOMY;  Surgeon: Lucas Mallow, MD;  [facility];  Service: Urology;  Laterality: Right;  . left index finger attachment  1980s   3-4 surgeries  . TOE SURGERY  1980s   in beween 2nd and 3rd toes cyst removed    Family History  Problem Relation Age of Onset  . Alzheimer's disease Mother   . Alzheimer's disease Father   . Heart disease Father     SOCIAL HISTORY: Social History   Tobacco Use  . Smoking status: Never Smoker  .  Smokeless tobacco: Never Used  Substance Use Topics  . Alcohol use: Never    Frequency: Never    Allergies  Allergen Reactions  . Penicillins Rash and Other (See Comments)    Has patient had a PCN reaction causing immediate rash, facial/tongue/throat swelling, SOB or lightheadedness with hypotension: yes Has patient had a PCN reaction causing severe rash involving mucus membranes or skin necrosis: no Has patient had a PCN reaction that required hospitalization: no Has patient  had a PCN reaction occurring within the last 10 years: no If all of the above answers are "NO", then may proceed with Cephalosporin use.     Current Outpatient Medications  Medication Sig Dispense Refill  . acetaminophen (TYLENOL) 500 MG tablet Take 500-1,000 mg by mouth every 8 (eight) hours as needed for mild pain.     Marland Kitchen allopurinol (ZYLOPRIM) 300 MG tablet Take 300 mg by mouth every evening.   3  . diphenhydramine-acetaminophen (TYLENOL PM) 25-500 MG TABS tablet Take 1 tablet by mouth at bedtime as needed (sleep).    . fluticasone (FLONASE) 50 MCG/ACT nasal spray Place 2 sprays into both nostrils daily as needed for allergies or rhinitis.    Marland Kitchen loratadine (CLARITIN) 10 MG tablet Take 10 mg by mouth every evening.     . metoprolol tartrate (LOPRESSOR) 25 MG tablet Take 1 tablet (25 mg total) by mouth 2 (two) times daily. 60 tablet 0  . omeprazole (PRILOSEC OTC) 20 MG tablet Take 40 mg by mouth every evening.     . Potassium Citrate 15 MEQ (1620 MG) TBCR Take 1 tablet by mouth every evening.   3  . senna-docusate (SENOKOT-S) 8.6-50 MG tablet Take 1 tablet by mouth daily. 30 tablet 1  . tamsulosin (FLOMAX) 0.4 MG CAPS capsule Take 0.4 mg by mouth every evening.   3  . HYDROcodone-acetaminophen (NORCO/VICODIN) 5-325 MG tablet Take 1 tablet by mouth every 4 (four) hours as needed for moderate pain. (Patient not taking: Reported on 06/12/2018) 10 tablet 0  . oxyCODONE-acetaminophen (PERCOCET) 5-325 MG tablet Take 1 tablet by mouth every 6 (six) hours as needed for moderate pain. (Patient not taking: Reported on 06/12/2018) 15 tablet 0   No current facility-administered medications for this visit.     REVIEW OF SYSTEMS:  [X]  denotes positive finding, [ ]  denotes negative finding Cardiac  Comments:  Chest pain or chest pressure:    Shortness of breath upon exertion:    Short of breath when lying flat:    Irregular heart rhythm:        Vascular    Pain in calf, thigh, or hip brought on by  ambulation:    Pain in feet at night that wakes you up from your sleep:     Blood clot in your veins:    Leg swelling:         Pulmonary    Oxygen at home:    Productive cough:     Wheezing:         Neurologic    Sudden weakness in arms or legs:     Sudden numbness in arms or legs:     Sudden onset of difficulty speaking or slurred speech:    Temporary loss of vision in one eye:     Problems with dizziness:         Gastrointestinal    Blood in stool:     Vomited blood:         Genitourinary    Burning  when urinating:     Blood in urine:        Psychiatric    Major depression:         Hematologic    Bleeding problems:    Problems with blood clotting too easily:        Skin    Rashes or ulcers:        Constitutional    Fever or chills:      PHYSICAL EXAM: Vitals:   06/12/18 1125 06/12/18 1208  BP: 105/74 136/66  Pulse: (!) 125 89  Resp: 18   Temp: 98.9 F (37.2 C)   TempSrc: Oral   SpO2: 98% 97%  Weight: (!) 139.3 kg   Height: 6\' 1"  (1.854 m)     GENERAL: Looked pale on initial evaluation, diaphoretic  CARDIAC: RRR VASCULAR:  Good thrill in left radiocephalic AVF up to mid-forearm  Palpable left radial pulse PULMONARY: No respiratory distress. DATA:   I independently reviewed his fistula duplex which shows marginal flow volumes of 410.  He has small cephalic vein in the distal forearm but the mid to proximal forearm is dilating nicely to near 6 mm.  Assessment/Plan:  62 year old male that presents for postop follow-up after left radiocephalic fistula in the setting of chronic kidney disease after recent nephrectomy.  I the results of his fistula duplex with Mr. Kersh and he has a great thrill in the fistula itself up to the mid forearm.  The fistula has several distal branches that were noted on duplex today but after evaluating these myself with Sonostie they are relatively small branches in the distal forearm.  I think we should plan for close  interval follow-up and I will see him back in a month to see how things are going with a repeat duplex.  Certainly if there are issues accessing the fistula once it reaches 3 months we will need to move forward with revision.  He seems overall fatigued and worn down after recent nephrectomy and states he doesn't feel well.  In addition during my evaluation in clinic the patient became unresponsive, turned pale, fell back on the exam table, and became unconscious for about 3 to 4 minutes.  During this time we were able to appreciate a palpable pulse.  Once he regained consciousness he was extremely diaphoretic.  States this has never happened before.  His BP and HR were stable once we checked his vitals.  We called EMS and I advised he should be evaluated in the ED given limited resources here in clinic.   Marty Heck, MD [facility] Office: [phone] Pager: [place]

## 2018-06-12 NOTE — ED Provider Notes (Signed)
I received this patient in signout from Dr. Alvino Chapel. We were awaiting labwork results prior to admission for syncope.  Lab work shows stable chronic kidney disease with potassium of 5.1 today, stable mild anemia, no severe electrolyte derangements.  Chest x-ray negative acute.  Vital signs have remained stable here.  Discussed syncope admission with internal medicine teaching service, who will admit patient for further care.   Sheridyn Canino, Wenda Overland, MD 06/12/18 [phone]

## 2018-06-12 NOTE — ED Provider Notes (Signed)
[facility] EMERGENCY DEPARTMENT Provider Note   CSN: [id number] Arrival date & time: 06/12/18  1251     History   Chief Complaint Chief Complaint  Patient presents with  . Loss of Consciousness    HPI George Lewis. is a 62 y.o. male.  HPI Patient presents after syncopal episode at vascular office.  Around 1 month ago had a nephrectomy.  Was at the vascular office today to evaluate his recent left forearm graft.  While sitting down and being evaluated had a syncopal episode.  States he knew he was going out.  Reportedly was pale.  Never lost a pulse and continued to breathe but was unresponsive for reported 3 to 4 minutes.  No chest pain.  Normally gets around pretty well.  States he actually probably overexerted himself yesterday and today.  Has some abdominal pain but really unchanged.  No chest pain or trouble breathing.  No headache.  No trauma. Past Medical History:  Diagnosis Date  . Arthritis    knees  . Bilateral renal cysts 03/23/2018   Noted MRI ABD  . Cholelithiasis 03/19/2018   noted on CT AB/Pelvis  . CKD (chronic kidney disease), stage IV [facility])    nephrologist-- dr Hollie Salk [name]);  05-01-2018 has not started dialysis, scheduled for AV fistula creation 05-07-2018  . Diverticulosis of colon 04/19/2018   Noted on CT abd/pelvis  . GERD (gastroesophageal reflux disease)   . Hepatic steatosis 03/19/2018   Mild diffuse, noted on CT AB/Pelvis  . History of kidney stones   . History of pulmonary embolus (PE) 03/2008   treated with coumadin for 6 months  . History of sepsis 04/20/2018   per discharge note , probable UTI  . Hypertension   . Nocturia   . OSA on CPAP   . Pancreatic lesion    0.4cm cystic per CT 07/ 2019  . Pneumonia   . Pulmonary nodule    Solid 13mm Right Lower Lobe  . Right renal mass 04/10/2018   new dx--  scheduled for nephrectomy 05-16-2018  . S/P ureteral stent placement: 04/16/2018 04/19/2018    Patient  Active Problem List   Diagnosis Date Noted  . Ileus ([place])   . SVT (supraventricular tachycardia) ([place])   . Renal mass, right 05/16/2018  . Mass of pancreas   . Abnormal CT of the abdomen   . Bacteremia due to Enterococcus 04/23/2018  . Sepsis ([place]) 04/19/2018  . CKD (chronic kidney disease) 04/19/2018  . Dehydration 04/19/2018  . UTI (urinary tract infection): Probable 04/19/2018  . Leukocytosis 04/19/2018  . Anemia 04/19/2018  . Renal cell carcinoma, right ([place]) 04/19/2018  . S/P ureteral stent placement: 04/16/2018 04/19/2018  . GERD (gastroesophageal reflux disease) 04/19/2018  . OSA on CPAP 04/19/2018  . HTN (hypertension) 04/19/2018    Past Surgical History:  Procedure Laterality Date  . AV FISTULA PLACEMENT Left 05/07/2018   Procedure: Creation of Left arm Radiocephalic Fistula;  Surgeon: Marty Heck, MD;  Location: [place];  Service: Vascular;  Laterality: Left;  . CYSTOSCOPY/RETROGRADE/URETEROSCOPY/STONE EXTRACTION WITH BASKET  x2 last one 1990s approx.  . CYSTOSCOPY/URETEROSCOPY/HOLMIUM LASER/STENT PLACEMENT Left 04/16/2018   Procedure: CYSTOSCOPY/LEFT URETEROSCOPY/LEFT RETROGRADE/STENT PLACEMENT;  Surgeon: Lucas Mallow, MD;  [facility];  Service: Urology;  Laterality: Left;  . EUS N/A 05/03/2018   Procedure: UPPER ENDOSCOPIC ULTRASOUND (EUS) RADIAL;  Surgeon: Milus Banister, MD;  [facility];  Service: Gastroenterology;  Laterality: N/A;  . EUS N/A  05/03/2018   Procedure: UPPER ENDOSCOPIC ULTRASOUND (EUS) LINEAR;  Surgeon: Milus Banister, MD;  Location: [facility];  Service: Gastroenterology;  Laterality: N/A;  . EXTRACORPOREAL SHOCK WAVE LITHOTRIPSY  x3  last one 2004 approx.  Marland Kitchen FINE NEEDLE ASPIRATION N/A 05/03/2018   Procedure: FINE NEEDLE ASPIRATION (FNA) LINEAR;  Surgeon: Milus Banister, MD;  [facility];  Service: Gastroenterology;  Laterality: N/A;  . LAPAROSCOPIC NEPHRECTOMY, HAND ASSISTED Right 05/16/2018   Procedure: HAND  ASSISTED LAPAROSCOPIC RIGHT NEPHRECTOMY;  Surgeon: Lucas Mallow, MD;  [facility];  Service: Urology;  Laterality: Right;  . left index finger attachment  1980s   3-4 surgeries  . TOE SURGERY  1980s   in beween 2nd and 3rd toes cyst removed        Home Medications    Prior to Admission medications   Medication Sig Start Date End Date Taking? Authorizing Provider  acetaminophen (TYLENOL) 500 MG tablet Take 500-1,000 mg by mouth every 8 (eight) hours as needed for mild pain.    Yes [provider]  allopurinol (ZYLOPRIM) 300 MG tablet Take 300 mg by mouth every evening.  03/08/18  Yes [provider]  diphenhydramine-acetaminophen (TYLENOL PM) 25-500 MG TABS tablet Take 1 tablet by mouth at bedtime as needed (sleep).   Yes [provider]  fluticasone (FLONASE) 50 MCG/ACT nasal spray Place 2 sprays into both nostrils daily as needed for allergies or rhinitis.   Yes [provider]  loratadine (CLARITIN) 10 MG tablet Take 10 mg by mouth every evening.    Yes [provider]  omeprazole (PRILOSEC OTC) 20 MG tablet Take 40 mg by mouth every evening.    Yes [provider]  Potassium Citrate 15 MEQ (1620 MG) TBCR Take 1 tablet by mouth every evening.  03/22/18  Yes [provider]  senna-docusate (SENOKOT-S) 8.6-50 MG tablet Take 1 tablet by mouth daily. Patient taking differently: Take 1 tablet by mouth daily as needed for mild constipation.  05/21/18  Yes Fredricka Bonine, MD  tamsulosin (FLOMAX) 0.4 MG CAPS capsule Take 0.4 mg by mouth every evening.  03/08/18  Yes [provider]  HYDROcodone-acetaminophen (NORCO/VICODIN) 5-325 MG tablet Take 1 tablet by mouth every 4 (four) hours as needed for moderate pain. Patient not taking: Reported on 06/12/2018 04/16/18 04/16/19  Marton Redwood III, MD  metoprolol tartrate (LOPRESSOR) 25 MG tablet Take 1 tablet (25 mg total) by mouth 2 (two) times daily. Patient not taking:  Reported on 06/12/2018 04/23/18   Eugenie Filler, MD  oxyCODONE-acetaminophen (PERCOCET) 5-325 MG tablet Take 1 tablet by mouth every 6 (six) hours as needed for moderate pain. Patient not taking: Reported on 06/12/2018 05/07/18 05/07/19  Marty Heck, MD    Family History Family History  Problem Relation Age of Onset  . Alzheimer's disease Mother   . Alzheimer's disease Father   . Heart disease Father     Social History Social History   Tobacco Use  . Smoking status: Never Smoker  . Smokeless tobacco: Never Used  Substance Use Topics  . Alcohol use: Never    Frequency: Never  . Drug use: Never     Allergies   Penicillins   Review of Systems Review of Systems  Constitutional: Negative for appetite change.  HENT: Negative for congestion.   Respiratory: Negative for shortness of breath.   Cardiovascular: Negative for chest pain.  Gastrointestinal: Positive for abdominal pain.  Genitourinary: Negative for flank pain.  Musculoskeletal: Negative for back pain.  Skin: Negative for rash and wound.  Neurological: Positive for syncope and weakness. Negative for light-headedness.  Psychiatric/Behavioral: Negative for confusion.     Physical Exam Updated Vital Signs BP 95/65 (BP Location: Right Arm)   Pulse (!) 114   Temp 97.6 F (36.4 C) (Oral)   Resp 10   Ht 6\' 1"  (1.854 m)   Wt (!) 136.5 kg   SpO2 98%   BMI 39.71 kg/m   Physical Exam  Constitutional: He appears well-developed and well-nourished.  HENT:  Head: Atraumatic.  Eyes: Pupils are equal, round, and reactive to light.  Neck: Neck supple.  Cardiovascular: Normal rate.  Somewhat frequent PVCs on monitor  Pulmonary/Chest: Effort normal.  Abdominal:  Healing abdominal wounds from surgery.  Mild right lower quadrant tenderness.  Musculoskeletal: He exhibits no tenderness.  Neurological: He is alert.  Skin: Skin is warm. Capillary refill takes less than 2 seconds.     ED Treatments / Results    Labs (all labs ordered are listed, but only abnormal results are displayed) Labs Reviewed  CBC WITH DIFFERENTIAL/PLATELET - Abnormal; Notable for the following components:      Result Value   WBC 13.5 (*)    RBC 4.01 (*)    Hemoglobin 11.0 (*)    HCT 35.5 (*)    RDW 16.7 (*)    Neutro Abs 10.6 (*)    Abs Immature Granulocytes 0.29 (*)    All other components within normal limits  COMPREHENSIVE METABOLIC PANEL  TROPONIN I  URINALYSIS, ROUTINE W REFLEX MICROSCOPIC    EKG EKG Interpretation  Date/Time:  Tuesday June 12 2018 12:57:37 EDT Ventricular Rate:  92 PR Interval:    QRS Duration: 96 QT Interval:  346 QTC Calculation: 428 R Axis:   1 Text Interpretation:  Sinus rhythm Anteroseptal infarct, old No significant change since last tracing Confirmed by Davonna Belling [phone]) on 06/12/2018 1:32:55 PM   Radiology No results found.  Procedures Procedures (including critical care time)  Medications Ordered in ED Medications  sodium chloride 0.9 % bolus 500 mL (500 mLs Intravenous New Bag/Given 06/12/18 1456)  pantoprazole (PROTONIX) injection 40 mg (has no administration in time range)     Initial Impression / Assessment and Plan / ED Course  I have reviewed the triage vital signs and the nursing notes.  Pertinent labs & imaging results that were available during my care of the patient were reviewed by me and considered in my medical decision making (see chart for details).     Patient with 1 syncope.  Orthostatic.  Happened while sitting while at the doctor.  Lab work pending but has orthostasis.  Fluid bolus given.  With comorbidities patient will likely require admission.  Doubt pulmonary embolism.  Care turned over to Dr. Rex Kras.  Final Clinical Impressions(s) / ED Diagnoses   Final diagnoses:  Syncope, unspecified syncope type    ED Discharge Orders    None       Davonna Belling, MD 06/12/18 1540

## 2018-06-12 NOTE — H&P (Signed)
Date: 06/12/2018               Patient Name:  George Lewis. MRN: [id number]  DOB: 01-06-1956 Age / Sex: 62 y.o., male   PCP: Pc, [facility]              Medical Service: Internal Medicine Teaching Service              Attending Physician: Dr. Rex Kras, Wenda Overland, MD    First Contact: Nena Polio, MS3 Pager: [phone]  Second Contact: Dr. Bobbe Medico Pager: [phone]  Third Contact Dr. Ina Homes Pager: [phone]       After Hours (After 5p/  First Contact Pager: [phone]  weekends / holidays): Second Contact Pager: [phone]   Chief Complaint: syncope  History of Present Illness: George Lewis is a 62 yo male with a past medical history of kidney stones, CKD stage 4, RCC s/p right nephrectomy (04/2018), SVT, bilateral pulmonary embolism (~12 years ago), HTN, and OSA on CPAP, who presented to the ED on 10/15 from his appointment at the Vascular office due to an episode of syncope. He was at the vascular office for follow-up on recent left forearm graft. After sitting on the examination table for ~15 min, he started feeling lightheaded and sweaty, before passing out. His wife reports that he became pale. He denies chest pain, palpitations, dyspnea, urinary or fecal incontinence, muscle spasms or seizing. He did not hit his head. His wife reports that he woke up after a few minutes. Yesterday and today, George Lewis had been walking more than he had been able to in the past month since his nephrectomy. Of note, he had four vials of blood drawn this morning and last ate and drank something around 8 AM. He has never had syncope before.  Meds: Current Facility-Administered Medications  Medication Dose Route Frequency Provider Last Rate Last Dose  . acetaminophen (TYLENOL) tablet 650 mg  650 mg Oral Q6H PRN Ina Homes, MD       Or  . acetaminophen (TYLENOL) suppository 650 mg  650 mg Rectal Q6H PRN Ina Homes, MD      . allopurinol (ZYLOPRIM) tablet 300 mg   300 mg Oral QPM Helberg, Justin, MD      . fluticasone (FLONASE) 50 MCG/ACT nasal spray 2 spray  2 spray Each Nare Daily PRN Ina Homes, MD      . heparin injection 5,000 Units  5,000 Units Subcutaneous Q8H Helberg, Justin, MD      . loratadine (CLARITIN) tablet 10 mg  10 mg Oral QPM Helberg, Justin, MD      . omeprazole (PRILOSEC OTC) EC tablet 40 mg  40 mg Oral QPM Helberg, Justin, MD      . senna-docusate (Senokot-S) tablet 1 tablet  1 tablet Oral Daily Helberg, Justin, MD      . tamsulosin (FLOMAX) capsule 0.4 mg  0.4 mg Oral QPM Ina Homes, MD       Current Outpatient Medications  Medication Sig Dispense Refill  . acetaminophen (TYLENOL) 500 MG tablet Take 500-1,000 mg by mouth every 8 (eight) hours as needed for mild pain.     Marland Kitchen allopurinol (ZYLOPRIM) 300 MG tablet Take 300 mg by mouth every evening.   3  . diphenhydramine-acetaminophen (TYLENOL PM) 25-500 MG TABS tablet Take 1 tablet by mouth at bedtime as needed (sleep).    . fluticasone (FLONASE) 50 MCG/ACT nasal spray Place 2 sprays into both nostrils daily as needed for  allergies or rhinitis.    Marland Kitchen loratadine (CLARITIN) 10 MG tablet Take 10 mg by mouth every evening.     Marland Kitchen omeprazole (PRILOSEC OTC) 20 MG tablet Take 40 mg by mouth every evening.     . Potassium Citrate 15 MEQ (1620 MG) TBCR Take 1 tablet by mouth every evening.   3  . senna-docusate (SENOKOT-S) 8.6-50 MG tablet Take 1 tablet by mouth daily. (Patient taking differently: Take 1 tablet by mouth daily as needed for mild constipation. ) 30 tablet 1  . tamsulosin (FLOMAX) 0.4 MG CAPS capsule Take 0.4 mg by mouth every evening.   3  . HYDROcodone-acetaminophen (NORCO/VICODIN) 5-325 MG tablet Take 1 tablet by mouth every 4 (four) hours as needed for moderate pain. (Patient not taking: Reported on 06/12/2018) 10 tablet 0  . metoprolol tartrate (LOPRESSOR) 25 MG tablet Take 1 tablet (25 mg total) by mouth 2 (two) times daily. (Patient not taking: Reported on 06/12/2018)  60 tablet 0  . oxyCODONE-acetaminophen (PERCOCET) 5-325 MG tablet Take 1 tablet by mouth every 6 (six) hours as needed for moderate pain. (Patient not taking: Reported on 06/12/2018) 15 tablet 0   Allergies: Penicillin - rash  Past Medical History:  Diagnosis Date  . Arthritis    knees  . Bilateral renal cysts 03/23/2018   Noted MRI ABD  . Cholelithiasis 03/19/2018   noted on CT AB/Pelvis  . CKD (chronic kidney disease), stage IV [facility])    nephrologist-- dr Hollie Salk [name]);  05-01-2018 has not started dialysis, scheduled for AV fistula creation 05-07-2018  . Diverticulosis of colon 04/19/2018   Noted on CT abd/pelvis  . GERD (gastroesophageal reflux disease)   . Hepatic steatosis 03/19/2018   Mild diffuse, noted on CT AB/Pelvis  . History of kidney stones   . History of pulmonary embolus (PE) 03/2008   treated with coumadin for 6 months  . History of sepsis 04/20/2018   per discharge note , probable UTI  . Hypertension   . Nocturia   . OSA on CPAP   . Pancreatic lesion    0.4cm cystic per CT 07/ 2019  . Pneumonia   . Pulmonary nodule    Solid 32mm Right Lower Lobe  . Right renal mass 04/10/2018   new dx--  scheduled for nephrectomy 05-16-2018  . S/P ureteral stent placement: 04/16/2018 04/19/2018   Past Surgical History:  Procedure Laterality Date  . AV FISTULA PLACEMENT Left 05/07/2018   Procedure: Creation of Left arm Radiocephalic Fistula;  Surgeon: Marty Heck, MD;  Location: [place];  Service: Vascular;  Laterality: Left;  . CYSTOSCOPY/RETROGRADE/URETEROSCOPY/STONE EXTRACTION WITH BASKET  x2 last one 1990s approx.  . CYSTOSCOPY/URETEROSCOPY/HOLMIUM LASER/STENT PLACEMENT Left 04/16/2018   Procedure: CYSTOSCOPY/LEFT URETEROSCOPY/LEFT RETROGRADE/STENT PLACEMENT;  Surgeon: Lucas Mallow, MD;  Location: [facility];  Service: Urology;  Laterality: Left;  . EUS N/A 05/03/2018   Procedure: UPPER ENDOSCOPIC ULTRASOUND (EUS) RADIAL;  Surgeon: Milus Banister, MD;   Location: [facility];  Service: Gastroenterology;  Laterality: N/A;  . EUS N/A 05/03/2018   Procedure: UPPER ENDOSCOPIC ULTRASOUND (EUS) LINEAR;  Surgeon: Milus Banister, MD;  Location: [facility];  Service: Gastroenterology;  Laterality: N/A;  . EXTRACORPOREAL SHOCK WAVE LITHOTRIPSY  x3  last one 2004 approx.  Marland Kitchen FINE NEEDLE ASPIRATION N/A 05/03/2018   Procedure: FINE NEEDLE ASPIRATION (FNA) LINEAR;  Surgeon: Milus Banister, MD;  Location: [facility];  Service: Gastroenterology;  Laterality: N/A;  . LAPAROSCOPIC NEPHRECTOMY, HAND ASSISTED Right 05/16/2018  Procedure: HAND ASSISTED LAPAROSCOPIC RIGHT NEPHRECTOMY;  Surgeon: Lucas Mallow, MD;  Location: [facility];  Service: Urology;  Laterality: Right;  . left index finger attachment  1980s   3-4 surgeries  . TOE SURGERY  1980s   in beween 2nd and 3rd toes cyst removed   Family History  Problem Relation Age of Onset  . Alzheimer's disease Mother   . Alzheimer's disease Father   . Heart disease Father   . Heart attack Father   . Cancer - Other Sister    Social History: George Lewis is married and lives at home with his wife, Vaughan Basta. 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 He has never used tobacco, alcohol, or recreational drugs.  Review of Systems: Pertinent items are noted in HPI.  Physical Exam: Blood pressure 126/73, pulse 85, temperature 97.6 F (36.4 C), temperature source Oral, resp. rate 13, height 6\' 1"  (1.854 m), weight (!) 136.5 kg, SpO2 100 %. General appearance: alert, cooperative, appears stated age, no distress, morbidly obese and pale Eyes: EOM's intact, conjunctiva clear Lungs: clear to auscultation bilaterally Heart: regular rate and rhythm, S1, S2 normal and distant heart sounds Abdomen: Soft, mildly tender to palpation (R side > L side), midline scar and epigastric scar well healing, bowel sounds normal Extremities: trace non-pitting edema bilateral lower  extremities Neurologic: CN II-XII intact, motor and strength intact  Lab results: BUN 33 Cr 3.83 K 5.1 Troponin <0.03 Hgb 11.0  UA  100 protein, small Hgb urine dipstick  Imaging results:  CXR 10/15 - I have reviewed this image. Cardiomegaly, stable from prior, no acute disease   Other results: EKG 10/15: I have reviewed this EKG. Sinus rhythm; old anteroseptal infarct; No significant change since last tracing  Assessment & Plan by Problem: Active Problems:   Syncope  George Lewis is a 62 yo male with a past medical history of kidney stones, CKD stage 4, RCC s/p right nephrectomy (04/2018), SVT, bilateral pulmonary embolism (~12 years ago), HTN, and OSA on CPAP, who presented to the ED on 10/15 from his appointment at the Vascular office due to an episode of syncope.  Syncope: George Lewis presented on 10/15 with one episode of syncope with prodrome while sitting at the doctor's office. He was given a fluid bolus in the ED. CXR revealed cardiomegaly, but stable from his prior CXR. Orthostatic blood pressure measurements were normal, making orthostatic syncope less likely. Recent echo (09/20) revealed LV EF 50-55% and no signs of structural heart disease. EKG today revealed no changes from prior. Pulmonary embolism is unlikely as he has not had pleuritic chest pain, cough, hemoptysis, crackles on lung exam, or tachycardia. He most likely had vasovagal syncope, as he had a prodrome and had been walking the past two days without much hydration. We will continue him on telemetry.  1. Monitor telemetry  Nephrectomy: George Lewis had a right nephrectomy in September 2019 due to [facility]. We will continue on his home meds. 1. Continue home tamsulosin 0.4 mg qd 2. Continue home allopurinol 300 mg qd  Code: full  Diet: regular IVF: one bolus of NS given, no IVF currently DVT ppx: subq heparin q8h   This is a Careers information officer Note.  The care of the patient was discussed with Dr. Myrtie Hawk and the  assessment and plan was formulated with their assistance.  Please see their note for official documentation of the patient encounter.  Attestation for [employer]  Documentation:   Signed: Dimas Millin, Medical Student 06/12/2018, 6:07 PM   I have seen and examined the patient myself, and I have reviewed the note by Nena Polio, MS 3 and was present during the interview and physical exam.  Please see my separate H&P for additional findings, assessment, and plan.   Signed: Dewayne Hatch, MD 06/12/2018, 6:51 PM

## 2018-06-12 NOTE — ED Notes (Signed)
2 gold top tubes drawn and sent to lab for admission purposes.

## 2018-06-13 DIAGNOSIS — G4733 Obstructive sleep apnea (adult) (pediatric): Secondary | ICD-10-CM | POA: Diagnosis not present

## 2018-06-13 DIAGNOSIS — N184 Chronic kidney disease, stage 4 (severe): Secondary | ICD-10-CM | POA: Diagnosis not present

## 2018-06-13 DIAGNOSIS — I129 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 kidney disease, or unspecified chronic kidney disease: Secondary | ICD-10-CM | POA: Diagnosis not present

## 2018-06-13 DIAGNOSIS — R55 Syncope and collapse: Secondary | ICD-10-CM

## 2018-06-13 LAB — BASIC METABOLIC PANEL
Anion gap: 9 (ref 5–15)
BUN: 34 mg/dL — AB (ref 6–20)
CHLORIDE: 110 mmol/L (ref 98–111)
CO2: 18 mmol/L — ABNORMAL LOW (ref 22–32)
CREATININE: 3.8 mg/dL — AB (ref 0.61–1.24)
Calcium: 9.1 mg/dL (ref 8.9–10.3)
GFR, EST AFRICAN AMERICAN: 19 mL/min — AB (ref >60)
GFR, EST NON AFRICAN AMERICAN: 16 mL/min — AB (ref >60)
Glucose, Bld: 118 mg/dL — ABNORMAL HIGH (ref 70–99)
POTASSIUM: 4.8 mmol/L (ref 3.5–5.1)
SODIUM: 137 mmol/L (ref 135–145)

## 2018-06-13 MED ORDER — METOPROLOL TARTRATE 25 MG PO TABS: 25.0000 mg | ORAL_TABLET | Freq: Two times a day (BID) | ORAL | 2 refills | Status: DC

## 2018-06-13 NOTE — Discharge Instructions (Signed)
George Lewis, it was a pleasure to take care of you during your hospital stay. While you were here, we took care of you after you passed out. The cardiologist evaluated you, and we suspect that your passing out was related to your recent surgery and dehydration. Due to your fast heart rate, please restart your Metoprolol tartrate (Lopressor) 25 mg twice per day.  Please come back to the emergency room if you pass out again. Please follow-up with your Cardiologist, so that he can start managing your medication.

## 2018-06-13 NOTE — Progress Notes (Signed)
Repaged admitting about discharge.

## 2018-06-13 NOTE — Progress Notes (Signed)
Dr Myrtie Hawk returned page. She will check with her attending and will likely discharge pt.

## 2018-06-13 NOTE — Discharge Summary (Signed)
Name: George Lewis. MRN: [id number] DOB: 09-01-1955 62 y.o. PCP: Pc, [facility]  Date of Admission: 06/12/2018 12:51 PM Date of Discharge:  06/13/18 Attending Physician: [name], DO  Discharge Diagnosis: 1. Syncope  Discharge Medications: Allergies as of 06/13/2018      Reactions   Penicillins Rash, Other (See Comments)   Has patient had a PCN reaction causing immediate rash, facial/tongue/throat swelling, SOB or lightheadedness with hypotension: yes Has patient had a PCN reaction causing severe rash involving mucus membranes or skin necrosis: no Has patient had a PCN reaction that required hospitalization: no Has patient had a PCN reaction occurring within the last 10 years: no If all of the above answers are "NO", then may proceed with Cephalosporin use.      Medication List    STOP taking these medications   acetaminophen 500 MG tablet Commonly known as:  TYLENOL   HYDROcodone-acetaminophen 5-325 MG tablet Commonly known as:  NORCO/VICODIN   oxyCODONE-acetaminophen 5-325 MG tablet Commonly known as:  PERCOCET/ROXICET     TAKE these medications   allopurinol 300 MG tablet Commonly known as:  ZYLOPRIM Take 300 mg by mouth every evening.   diphenhydramine-acetaminophen 25-500 MG Tabs tablet Commonly known as:  TYLENOL PM Take 1 tablet by mouth at bedtime as needed (sleep).   fluticasone 50 MCG/ACT nasal spray Commonly known as:  FLONASE Place 2 sprays into both nostrils daily as needed for allergies or rhinitis.   loratadine 10 MG tablet Commonly known as:  CLARITIN Take 10 mg by mouth every evening.   metoprolol tartrate 25 MG tablet Commonly known as:  LOPRESSOR Take 1 tablet (25 mg total) by mouth 2 (two) times daily.   omeprazole 20 MG tablet Commonly known as:  PRILOSEC OTC Take 40 mg by mouth every evening.   Potassium Citrate 15 MEQ (1620 MG) Tbcr Take 1 tablet by mouth every evening.   senna-docusate 8.6-50 MG  tablet Commonly known as:  Senokot-S Take 1 tablet by mouth daily. What changed:    when to take this  reasons to take this   tamsulosin 0.4 MG Caps capsule Commonly known as:  FLOMAX Take 0.4 mg by mouth every evening.       Disposition and follow-up:   Mr.George Lewis. was discharged from [facility] in Stable condition.  At the hospital follow up visit please address:  1.  Follow-up of syncope episode and NSVT, PVCs  2.  Labs / imaging needed at time of follow-up: none  3.  Pending labs/ test needing follow-up: none  Follow-up Appointments: Follow-up with his cardiologist   Hospital Course by problem list: Mr. George Lewis is a 62 yo male with a past medical history of kidney stones, CKD stage 4, RCC s/p right nephrectomy (04/2018), SVT, bilateral pulmonary embolism (~12 years ago), HTN, and OSA on CPAP, who presented to the ED on 10/15 from his appointment at the Vascular office due to an episode of syncope.  1. Syncope: Mr. George Lewis presented on 10/15 with one episode of syncope with prodrome while sitting at the doctor's office. He was given a fluid bolus in the ED. CXR revealed cardiomegaly, but stable from his prior CXR. Orthostatic blood pressure measurements were normal, making orthostatic syncope less likely. Recent echo (09/20) revealed LV EF 50-55% and no signs of structural heart disease. EKG revealed no changes from prior. Pulmonary embolism is unlikely as he has not had pleuritic chest pain, cough, hemoptysis, crackles  on lung exam, or tachycardia. Overnight on 10/15, he was found to have intermittent PVCs and tachycardia. Due to this along with his history of tachycardia and being placed on metoprolol in the past, we suspected his syncope to be cardiac related. Cardiology consult suspected vasovagal etiology. They recommended that he restart metoprolol tartrate 25 mg BID and follow-up with his cardiologist.  Discharge Vitals:   BP 129/78 (BP  Location: Right Arm)   Pulse (!) 105   Temp 99 F (37.2 C) (Oral)   Resp 18   Ht 6\' 1"  (1.854 m)   Wt (!) 136.5 kg   SpO2 97%   BMI 39.71 kg/m   Pertinent Labs, Studies, and Procedures:  CXR 10/15 - I have reviewed this image. Cardiomegaly, stable from prior, no acute disease  EKG 10/15: I have reviewed this EKG. Sinus rhythm; old anteroseptal infarct; No significant change since last tracing  Troponin <0.03  Discharge Instructions: Mr. George Lewis, it was a pleasure to take care of you during your hospital stay. While you were here, we took care of you after you passed out. The cardiologist evaluated you, and we suspect that your passing out was related to your recent surgery and dehydration. Due to your fast heart rate, please restart your Metoprolol tartrate (Lopressor) 25 mg twice per day.  Please come back to the emergency room if you pass out again. Please follow-up with your Cardiologist, so that he can start managing your medication.  SignedDewayne Hatch, MD 06/13/2018, 6:29 PM   Pager: [phone]

## 2018-06-13 NOTE — Progress Notes (Signed)
Subjective: This morning, George Lewis has no complaints and is doing well. He denies palpitations or noticing if his heart is beating fast.   Of note, Mr. Schewe has a recent history of taking metoprolol tartrate, starting around 04/23/18. He was started on the metoprolol tartrate for some tachycardia and elevated blood pressures found while he was in the hospital at [facility]. He took this medication for one month and ran out about one week ago. He and his wife are uncertain if he was supposed to continue taking it.   Objective: Vital signs in last 24 hours: Vitals:   06/12/18 1815 06/12/18 2320 06/13/18 0609 06/13/18 1236  BP: 123/77 121/77 119/73 126/74  Pulse: 80 85 91 87  Resp: 16 18 16 18   Temp: 98.4 F (36.9 C) 98.6 F (37 C) 97.9 F (36.6 C) 98.7 F (37.1 C)  TempSrc: Oral Oral Oral Oral  SpO2: 100% 98% 97% 98%  Weight:      Height:         Intake/Output Summary (Last 24 hours) at 06/13/2018 1350 Last data filed at 06/12/2018 1601 Gross per 24 hour  Intake 500 ml  Output -  Net 500 ml   General appearance: alert, cooperative, appears stated age, no distress and morbidly obese Lungs: CTAB, distant breath sounds, difficult to assess due to patient having difficulty leaning forward Heart: regular rate and rhythm, S1, S2 normal and distant heart sounds Abdomen: Soft, mildly tender to palpation (R side > L side), midline scar and epigastric scar well healing, bowel sounds normal Extremities: extremities normal, atraumatic, no cyanosis or edema  Lab Results: Cr 3.80 BUN 34  Micro Results: No results found for this or any previous visit (from the past 240 hour(s)).   Studies/Results: CXR 10/15: I have reviewed this image. Cardiomegaly stable from prior, no acute disease EKG 10/15: I have reviewed this EKG. Sinus rhythm; old anteroseptal infarct; No significant change since last tracing  Medications: I have reviewed the patient's current medications. Scheduled  Meds: . allopurinol  300 mg Oral QPM  . heparin  5,000 Units Subcutaneous Q8H  . loratadine  10 mg Oral QPM  . pantoprazole  40 mg Oral q1800  . senna-docusate  1 tablet Oral Daily  . tamsulosin  0.4 mg Oral QPM   Continuous Infusions: PRN Meds:.acetaminophen **OR** acetaminophen, fluticasone Assessment/Plan: Active Problems:   Syncope  Mr. Urbas is a 62 yo male with a past medical history of kidney stones, CKD stage 4, RCC s/p right nephrectomy (04/2018), SVT, bilateral pulmonary embolism (~12 years ago), HTN, and OSA on CPAP, who presented to the ED on 10/15 from his appointment at the Vascular office due to an episode of syncope.  Syncope: Mr. Blansett telemetry from overnight revealed multiple PVCs and some episodes of tachycardia. As noted in the Subjective section, he also has a history of SVTs and prior metoprolol tartrate use. Because of his history, we are more concerned for cardiac cause of syncope. We will continue him on telemetry. Cardiology consulted and recommended restarting metoprolol tartrate and follow-up with his cardiologist. 1. Monitor telemetry 2. Appreciate cardiology recs 3. Restart metoprolol tartrate 25 mg BID upon discharge  Nephrectomy: Mr. Heupel had a right nephrectomy in September 2019 due to [place]. We will continue on his home meds. 1. Continue home tamsulosin 0.4 mg qd 2. Continue home allopurinol 300 mg qd  Code: full  Diet: regular IVF: none DVT ppx: subq heparin q8h  This is a Careers information officer Note.  The care of the patient was discussed with Dr. Myrtie Hawk and the assessment and plan formulated with their assistance.  Please see their attached note for official documentation of the daily encounter.   LOS: 0 days   Dimas Millin, Medical Student 06/13/2018, 2:08 PM  I have seen and examined the patient myself, and I have reviewed the note by Nena Polio, MS3 and was present during the interview and physical exam.  Please see my  separate H&P for additional findings, assessment, and plan.   Signed: Dewayne Hatch, MD 06/13/2018, 6:29 PM

## 2018-06-13 NOTE — Progress Notes (Signed)
Repaged admitting to inquire about discharge. Patient and family are becoming inpatient and want to leave.

## 2018-06-13 NOTE — Progress Notes (Signed)
Paging treatment team to let them know cardiology has seen pt and to inquire about discharge.

## 2018-06-13 NOTE — Progress Notes (Signed)
Dr Lynwood Dawley returned page. She will give the person assigned my number.

## 2018-06-13 NOTE — Consult Note (Addendum)
Cardiology Consultation:   [name]. MRN: [id number]; DOB: [birth year]  Admit date: 06/12/2018 Date of Consult: 06/13/2018  Primary Care Provider: P[facility], [facility] Primary Cardiologist: Elouise Munroe, MD  Primary Electrophysiologist:  None    Patient Profile:   George Meter. is a 62 y.o. male with a hx of recent right nephrectomy for RCC, baseline CKD now with solitary kidney, hx of bilateral PE (12 years ago), HTN, and OSA on CPAP who is being seen today for the evaluation of syncope at the request of Dr. Heber Lyons.  History of Present Illness:   Mr. Frediani was recently discharged on 05/21/18 following a right nephrectomy for RCC. Post-op course was complicated by mild ileus, resolved, and was discharged with a creatinine of 3.7, likely his new baseline. Nephrology following. Pt was at his vascular appt yesterday and sitting on the exam table when he became pale, lightheaded and dizzy before experiencing a syncopal episode. On arrival in the ER, orthostatic were negative for hypotension and CXR with stable cardiomegaly. He was given a fluid bolus.   Echocardiogram 04/2018 with normal LVEF of 50-55% and grade 1 DD.   Cardiology was consulted for this syncopal episode. On my interview, wife is at bedside and provides some history. Yesterday, he went to [place] and walked around more than he normally does. When he returned to the doc appt, he sat on the exam table for approximately 20 min but was in severe pain (due to table and position). He does not recall the event, but reportedly fell back with LOC for 3-4 min. He denies chest pain, SOB, and palpitations. Pressure was stable just prior to the event.  Telemetry with 12-beat run of NSVT at 1503, asymptomatic. Telemetry review with frequent PVCs, but he is asymptomatic with these. He has not had lightheadedness, dizziness, or recurrence of syncope since being in the hospital.     Past Medical  History:  Diagnosis Date  . Arthritis    knees  . Bilateral renal cysts 03/23/2018   Noted MRI ABD  . Cholelithiasis 03/19/2018   noted on CT AB/Pelvis  . CKD (chronic kidney disease), stage IV [facility])    nephrologist-- dr Hollie Salk [name]);  05-01-2018 has not started dialysis, scheduled for AV fistula creation 05-07-2018  . Diverticulosis of colon 04/19/2018   Noted on CT abd/pelvis  . GERD (gastroesophageal reflux disease)   . Hepatic steatosis 03/19/2018   Mild diffuse, noted on CT AB/Pelvis  . History of kidney stones   . History of pulmonary embolus (PE) 03/2008   treated with coumadin for 6 months  . History of sepsis 04/20/2018   per discharge note , probable UTI  . Hypertension   . Nocturia   . OSA on CPAP   . Pancreatic lesion    0.4cm cystic per CT 07/ 2019  . Pneumonia   . Pulmonary nodule    Solid 2mm Right Lower Lobe  . Right renal mass 04/10/2018   new dx--  scheduled for nephrectomy 05-16-2018  . S/P ureteral stent placement: 04/16/2018 04/19/2018    Past Surgical History:  Procedure Laterality Date  . AV FISTULA PLACEMENT Left 05/07/2018   Procedure: Creation of Left arm Radiocephalic Fistula;  Surgeon: Marty Heck, MD;  Location: [place];  Service: Vascular;  Laterality: Left;  . CYSTOSCOPY/RETROGRADE/URETEROSCOPY/STONE EXTRACTION WITH BASKET  x2 last one 1990s approx.  . CYSTOSCOPY/URETEROSCOPY/HOLMIUM LASER/STENT PLACEMENT Left 04/16/2018   Procedure: CYSTOSCOPY/LEFT URETEROSCOPY/LEFT RETROGRADE/STENT PLACEMENT;  Surgeon: Lucas Mallow, MD;  [facility];  Service: Urology;  Laterality: Left;  . EUS N/A 05/03/2018   Procedure: UPPER ENDOSCOPIC ULTRASOUND (EUS) RADIAL;  Surgeon: Milus Banister, MD;  [facility];  Service: Gastroenterology;  Laterality: N/A;  . EUS N/A 05/03/2018   Procedure: UPPER ENDOSCOPIC ULTRASOUND (EUS) LINEAR;  Surgeon: Milus Banister, MD;  [facility];  Service: Gastroenterology;  Laterality:  N/A;  . EXTRACORPOREAL SHOCK WAVE LITHOTRIPSY  x3  last one 2004 approx.  Marland Kitchen FINE NEEDLE ASPIRATION N/A 05/03/2018   Procedure: FINE NEEDLE ASPIRATION (FNA) LINEAR;  Surgeon: Milus Banister, MD;  [facility];  Service: Gastroenterology;  Laterality: N/A;  . LAPAROSCOPIC NEPHRECTOMY, HAND ASSISTED Right 05/16/2018   Procedure: HAND ASSISTED LAPAROSCOPIC RIGHT NEPHRECTOMY;  Surgeon: Lucas Mallow, MD;  [facility];  Service: Urology;  Laterality: Right;  . left index finger attachment  1980s   3-4 surgeries  . TOE SURGERY  1980s   in beween 2nd and 3rd toes cyst removed     Home Medications:  Prior to Admission medications   Medication Sig Start Date End Date Taking? Authorizing Provider  acetaminophen (TYLENOL) 500 MG tablet Take 500-1,000 mg by mouth every 8 (eight) hours as needed for mild pain.    Yes [provider]  allopurinol (ZYLOPRIM) 300 MG tablet Take 300 mg by mouth every evening.  03/08/18  Yes [provider]  diphenhydramine-acetaminophen (TYLENOL PM) 25-500 MG TABS tablet Take 1 tablet by mouth at bedtime as needed (sleep).   Yes [provider]  fluticasone (FLONASE) 50 MCG/ACT nasal spray Place 2 sprays into both nostrils daily as needed for allergies or rhinitis.   Yes [provider]  loratadine (CLARITIN) 10 MG tablet Take 10 mg by mouth every evening.    Yes [provider]  omeprazole (PRILOSEC OTC) 20 MG tablet Take 40 mg by mouth every evening.    Yes [provider]  Potassium Citrate 15 MEQ (1620 MG) TBCR Take 1 tablet by mouth every evening.  03/22/18  Yes [provider]  senna-docusate (SENOKOT-S) 8.6-50 MG tablet Take 1 tablet by mouth daily. Patient taking differently: Take 1 tablet by mouth daily as needed for mild constipation.  05/21/18  Yes Fredricka Bonine, MD  tamsulosin (FLOMAX) 0.4 MG CAPS capsule Take 0.4 mg by mouth every evening.  03/08/18  Yes [provider]    HYDROcodone-acetaminophen (NORCO/VICODIN) 5-325 MG tablet Take 1 tablet by mouth every 4 (four) hours as needed for moderate pain. Patient not taking: Reported on 06/12/2018 04/16/18 04/16/19  Marton Redwood III, MD  metoprolol tartrate (LOPRESSOR) 25 MG tablet Take 1 tablet (25 mg total) by mouth 2 (two) times daily. Patient not taking: Reported on 06/12/2018 04/23/18   Eugenie Filler, MD  oxyCODONE-acetaminophen (PERCOCET) 5-325 MG tablet Take 1 tablet by mouth every 6 (six) hours as needed for moderate pain. Patient not taking: Reported on 06/12/2018 05/07/18 05/07/19  Marty Heck, MD    Inpatient Medications: Scheduled Meds: . allopurinol  300 mg Oral QPM  . heparin  5,000 Units Subcutaneous Q8H  . loratadine  10 mg Oral QPM  . pantoprazole  40 mg Oral q1800  . senna-docusate  1 tablet Oral Daily  . tamsulosin  0.4 mg Oral QPM   Continuous Infusions:  PRN Meds: acetaminophen **OR** acetaminophen, fluticasone  Allergies:    Allergies  Allergen Reactions  . Penicillins Rash and Other (See Comments)  Has patient had a PCN reaction causing immediate rash, facial/tongue/throat swelling, SOB or lightheadedness with hypotension: yes Has patient had a PCN reaction causing severe rash involving mucus membranes or skin necrosis: no Has patient had a PCN reaction that required hospitalization: no Has patient had a PCN reaction occurring within the last 10 years: no If all of the above answers are "NO", then may proceed with Cephalosporin use.     Social History:   Social History   Socioeconomic History  . Marital status: Married    Spouse name: Not on file  . Number of children: Not on file  . Years of education: Not on file  . Highest education level: Not on file  Occupational History  . Not on file  Social Needs  . Financial resource strain: Patient refused  . Food insecurity:    Worry: Never true    Inability: Never true  . Transportation needs:    Medical: No     Non-medical: No  Tobacco Use  . Smoking status: Never Smoker  . Smokeless tobacco: Never Used  Substance and Sexual Activity  . Alcohol use: Never    Frequency: Never  . Drug use: Never  . Sexual activity: Not on file  Lifestyle  . Physical activity:    Days per week: 0 days    Minutes per session: 0 min  . Stress: Not on file  Relationships  . Social connections:    Talks on phone: More than three times a week    Gets together: Never    Attends religious service: More than 4 times per year    Active member of club or organization: No    Attends meetings of clubs or organizations: Never    Relationship status: Married  . Intimate partner violence:    Fear of current or ex partner: Not on file    Emotionally abused: Not on file    Physically abused: Not on file    Forced sexual activity: Not on file  Other Topics Concern  . Not on file  Social History Narrative  . Not on file    Family History:    Family History  Problem Relation Age of Onset  . Alzheimer's disease Mother   . Alzheimer's disease Father   . Heart disease Father   . Heart attack Father   . Cancer - Other Sister      ROS:  Please see the history of present illness.   All other ROS reviewed and negative.     Physical Exam/Data:   Vitals:   06/12/18 1815 06/12/18 2320 06/13/18 0609 06/13/18 1236  BP: 123/77 121/77 119/73 126/74  Pulse: 80 85 91 87  Resp: 16 18 16 18   Temp: 98.4 F (36.9 C) 98.6 F (37 C) 97.9 F (36.6 C) 98.7 F (37.1 C)  TempSrc: Oral Oral Oral Oral  SpO2: 100% 98% 97% 98%  Weight:      Height:        Intake/Output Summary (Last 24 hours) at 06/13/2018 1540 Last data filed at 06/12/2018 1601 Gross per 24 hour  Intake 500 ml  Output -  Net 500 ml   Filed Weights   06/12/18 1255  Weight: (!) 136.5 kg   Body mass index is 39.71 kg/m.  General:  Well nourished, well developed, in no acute distress HEENT: normal Neck: no JVD Vascular: No carotid  bruits Cardiac:  normal S1, S2; RRR; no murmur Lungs:  clear to auscultation bilaterally, no wheezing, rhonchi  or rales  Abd: soft, nontender, no hepatomegaly  Ext: no edema Musculoskeletal:  No deformities, BUE and BLE strength normal and equal Skin: warm and dry  Neuro:  CNs 2-12 intact, no focal abnormalities noted Psych:  Normal affect   EKG:  The EKG was personally reviewed and demonstrates:  Sinus rhythm  Telemetry:  Telemetry was personally reviewed and demonstrates:  12-beat run of NSVT, PVCs  Relevant CV Studies:  Echo 05/18/18: - Left ventricle: The cavity size was normal. Wall thickness was   increased in a pattern of mild LVH. Systolic function was normal.   The estimated ejection fraction was in the range of 50% to 55%.   Wall motion was normal; there were no regional wall motion   abnormalities. Doppler parameters are consistent with abnormal   left ventricular relaxation (grade 1 diastolic dysfunction).  Impressions: - Technically difficult; definity used; normal LV systolic   function; mild diastolic dysfunction; mild LVH   Laboratory Data:  Chemistry Recent Labs  Lab 06/12/18 1426 06/13/18 0513  NA 136 137  K 5.1 4.8  CL 108 110  CO2 18* 18*  GLUCOSE 116* 118*  BUN 33* 34*  CREATININE 3.83* 3.80*  CALCIUM 9.2 9.1  GFRNONAA 16* 16*  GFRAA 18* 19*  ANIONGAP 10 9    Recent Labs  Lab 06/12/18 1426  PROT 7.2  ALBUMIN 3.4*  AST 29  ALT 19  ALKPHOS 68  BILITOT 0.5   Hematology Recent Labs  Lab 06/12/18 1426  WBC 13.5*  RBC 4.01*  HGB 11.0*  HCT 35.5*  MCV 88.5  MCH 27.4  MCHC 31.0  RDW 16.7*  PLT 320   Cardiac Enzymes Recent Labs  Lab 06/12/18 1426  TROPONINI <0.03   No results for input(s): TROPIPOC in the last 168 hours.  BNPNo results for input(s): BNP, PROBNP in the last 168 hours.  DDimer No results for input(s): DDIMER in the last 168 hours.  Radiology/Studies:  Dg Chest 2 View  Result Date: 06/12/2018 CLINICAL DATA:   Syncope EXAM: CHEST - 2 VIEW COMPARISON:  04/19/2018 FINDINGS: Chronic cardiomegaly and vascular pedicle widening. Stable interstitial prominence, accentuated by low volumes. There is no edema, consolidation, effusion, or pneumothorax. Artifact from EKG leads. IMPRESSION: Stable from prior.  No evidence of acute disease. Electronically Signed   By: Monte Fantasia M.D.   On: 06/12/2018 15:46    Assessment and Plan:   Syncope  Patient had one episode of syncope while experiencing pain s/p nephrectomy and likely dehydrated. Suspect a vasovagal etiology. He denies chest pain, SOB, and palpitations. He has not had a recurrence of symptoms. Recent echo with normal function, no valvular abnormalities noted. No bruits on exam.    NSVT, PVCs Electrolytes WNL. Will obtain Magnesium. He is asymptomatic with these. May consider increase in beta blocker if he becomes symptomatic.   Sinus tachycardia with ambulation I ambulated the patient in the hall. He walked briskly and had no symptoms, but HR increased to 140. Felt due to deconditioning and continuing to recover from nephrectomy. At home, he is on lopressor 25 mg BID - does not look like he has received this since admission.    [facility] will sign off.   Medication Recommendations:  Continue home 25 mg BID lopressor Other recommendations (labs, testing, etc):  none Follow up as an outpatient:  Cardiology appt setup.   For questions or updates, please contact [place] Please consult [email or web address] for contact info under     Signed,  Tami Lin [place], [place]  06/13/2018 3:40 PM   ---------------------------------------------   History and all data above reviewed.  Patient examined.  I agree with the findings as above.  George Lewis. feels well and has not had recurrent syncope or presyncope in hospital.   His syncopal episode was his first lifetime syncopal episode.  Constitutional: No acute distress Eyes: pupils  equally round and reactive to light, sclera non-icteric, normal conjunctiva and lids ENMT: normal dentition, moist mucous membranes Cardiovascular: regular rhythm, normal rate, no murmurs. S1 and S2 normal. Radial pulses normal bilaterally. No jugular venous distention.  Respiratory: clear to auscultation bilaterally GI : normal bowel sounds, soft and nontender. No distention.   MSK: extremities warm, well perfused. No edema. Fistula along radial aspect of left arm.  NEURO: grossly nonfocal exam, moves all extremities. PSYCH: alert and oriented x 3, normal mood and affect.   All available labs, radiology testing, previous records reviewed. Agree with documented assessment and plan of my colleague as stated above with the following additions or changes:  Active Problems:   Syncope   Plan: His syncope was likely vasovagal (neurocardiogenic) given likely dehydration, prolonged exertion, and pain.  He did have a prodrome of lightheadedness and a feeling of malaise just prior to losing consciousness.  While he demonstrates several short runs of nonsustained VT on telemetry, he was asymptomatic during these episodes and there is little concerned that his syncope was cardiogenic.  I believe the patient can report symptoms as an outpatient and follow-up in my clinic in approximately 4 weeks to address any lingering issues.  I have told him he can call me sooner than 4 weeks if he is having any additional symptoms.  I have instructed him not to drive due to possibility of recurrent symptoms, which he was already avoiding due to his recent surgery.  A magnesium should be checked, and we can check a hemaglobin A1c as an outpatient to rule out diabetes as a cause of symptoms.  Elouise Munroe, MD [facility]

## 2018-06-20 ENCOUNTER — Other Ambulatory Visit: Payer: Self-pay

## 2018-06-20 DIAGNOSIS — N184 Chronic kidney disease, stage 4 (severe): Secondary | ICD-10-CM

## 2018-07-17 ENCOUNTER — Ambulatory Visit: Payer: PRIVATE HEALTH INSURANCE | Admitting: Vascular Surgery

## 2018-07-17 ENCOUNTER — Encounter (HOSPITAL_COMMUNITY): Payer: PRIVATE HEALTH INSURANCE

## 2018-09-25 ENCOUNTER — Ambulatory Visit (HOSPITAL_COMMUNITY)
Admission: RE | Admit: 2018-09-25 | Discharge: 2018-09-25 | Disposition: A | Discharge status: Home / Self Care | Payer: PRIVATE HEALTH INSURANCE | Source: Ambulatory Visit | Attending: Vascular Surgery | Admitting: Vascular Surgery

## 2018-09-25 ENCOUNTER — Ambulatory Visit (INDEPENDENT_AMBULATORY_CARE_PROVIDER_SITE_OTHER): Payer: PRIVATE HEALTH INSURANCE | Admitting: Vascular Surgery

## 2018-09-25 ENCOUNTER — Encounter: Payer: Self-pay | Admitting: Vascular Surgery

## 2018-09-25 ENCOUNTER — Other Ambulatory Visit: Payer: Self-pay

## 2018-09-25 VITALS — BP 133/81 | HR 97 | Temp 98.0°F | Resp 20 | Ht 73.0 in | Wt 301.0 lb

## 2018-09-25 DIAGNOSIS — N184 Chronic kidney disease, stage 4 (severe): Secondary | ICD-10-CM

## 2018-09-25 NOTE — Progress Notes (Signed)
Patient name: George Lewis. MRN: [id number] DOB: 1955/12/08 Sex: male  REASON FOR VISIT: Follow-up status post left radiocephalic fistula  HPI: George Lewis. is a 63 y.o. male with history of stage IV chronic kidney disease status post nephrectomy that presents for ongoing follow-up.  We performed a left radiocephalic AV fistula on 4/0/8144.  On his initial postop visit there was concern for poor maturation near the anastomosis as well as some poor flow volumes.  He has been doing exercises by squeezing a ball with his left hand.  He has not had to start dialysis yet and states his kidney doctor said his kidney still working at 20% it could be up to a year.  He has really no significant symptoms in the hand otherwise.  Still states he has a good thrill.  Past Medical History:  Diagnosis Date  . Arthritis    knees  . Bilateral renal cysts 03/23/2018   Noted MRI ABD  . Cholelithiasis 03/19/2018   noted on CT AB/Pelvis  . CKD (chronic kidney disease), stage IV [facility])    nephrologist-- dr Hollie Salk [name]);  05-01-2018 has not started dialysis, scheduled for AV fistula creation 05-07-2018  . Diverticulosis of colon 04/19/2018   Noted on CT abd/pelvis  . GERD (gastroesophageal reflux disease)   . Hepatic steatosis 03/19/2018   Mild diffuse, noted on CT AB/Pelvis  . History of kidney stones   . History of pulmonary embolus (PE) 03/2008   treated with coumadin for 6 months  . History of sepsis 04/20/2018   per discharge note , probable UTI  . Hypertension   . Nocturia   . OSA on CPAP   . Pancreatic lesion    0.4cm cystic per CT 07/ 2019  . Pneumonia   . Pulmonary nodule    Solid 36mm Right Lower Lobe  . Right renal mass 04/10/2018   new dx--  scheduled for nephrectomy 05-16-2018  . S/P ureteral stent placement: 04/16/2018 04/19/2018    Past Surgical History:  Procedure Laterality Date  . AV FISTULA PLACEMENT Left 05/07/2018   Procedure: Creation of Left arm  Radiocephalic Fistula;  Surgeon: Marty Heck, MD;  Location: [place];  Service: Vascular;  Laterality: Left;  . CYSTOSCOPY/RETROGRADE/URETEROSCOPY/STONE EXTRACTION WITH BASKET  x2 last one 1990s approx.  . CYSTOSCOPY/URETEROSCOPY/HOLMIUM LASER/STENT PLACEMENT Left 04/16/2018   Procedure: CYSTOSCOPY/LEFT URETEROSCOPY/LEFT RETROGRADE/STENT PLACEMENT;  Surgeon: Lucas Mallow, MD;  [facility];  Service: Urology;  Laterality: Left;  . EUS N/A 05/03/2018   Procedure: UPPER ENDOSCOPIC ULTRASOUND (EUS) RADIAL;  Surgeon: Milus Banister, MD;  [facility];  Service: Gastroenterology;  Laterality: N/A;  . EUS N/A 05/03/2018   Procedure: UPPER ENDOSCOPIC ULTRASOUND (EUS) LINEAR;  Surgeon: Milus Banister, MD;  [facility];  Service: Gastroenterology;  Laterality: N/A;  . EXTRACORPOREAL SHOCK WAVE LITHOTRIPSY  x3  last one 2004 approx.  Marland Kitchen FINE NEEDLE ASPIRATION N/A 05/03/2018   Procedure: FINE NEEDLE ASPIRATION (FNA) LINEAR;  Surgeon: Milus Banister, MD;  [facility];  Service: Gastroenterology;  Laterality: N/A;  . LAPAROSCOPIC NEPHRECTOMY, HAND ASSISTED Right 05/16/2018   Procedure: HAND ASSISTED LAPAROSCOPIC RIGHT NEPHRECTOMY;  Surgeon: Lucas Mallow, MD;  [facility];  Service: Urology;  Laterality: Right;  . left index finger attachment  1980s   3-4 surgeries  . TOE SURGERY  1980s   in beween 2nd and 3rd toes cyst removed    Family History  Problem Relation Age  of Onset  . Alzheimer's disease Mother   . Alzheimer's disease Father   . Heart disease Father   . Heart attack Father   . Cancer - Other Sister     SOCIAL HISTORY: Social History   Tobacco Use  . Smoking status: Never Smoker  . Smokeless tobacco: Never Used  Substance Use Topics  . Alcohol use: Never    Frequency: Never    Allergies  Allergen Reactions  . Penicillins Rash and Other (See Comments)    Has patient had a PCN reaction causing immediate rash,  facial/tongue/throat swelling, SOB or lightheadedness with hypotension: yes Has patient had a PCN reaction causing severe rash involving mucus membranes or skin necrosis: no Has patient had a PCN reaction that required hospitalization: no Has patient had a PCN reaction occurring within the last 10 years: no If all of the above answers are "NO", then may proceed with Cephalosporin use.     Current Outpatient Medications  Medication Sig Dispense Refill  . allopurinol (ZYLOPRIM) 300 MG tablet Take 300 mg by mouth every evening.   3  . diphenhydramine-acetaminophen (TYLENOL PM) 25-500 MG TABS tablet Take 1 tablet by mouth at bedtime as needed (sleep).    . famotidine (PEPCID) 40 MG tablet Take 40 mg by mouth daily.    . fluticasone (FLONASE) 50 MCG/ACT nasal spray Place 2 sprays into both nostrils daily as needed for allergies or rhinitis.    . furosemide (LASIX) 40 MG tablet Take 1 tablet by mouth daily.    Marland Kitchen loratadine (CLARITIN) 10 MG tablet Take 10 mg by mouth every evening.     . metoprolol tartrate (LOPRESSOR) 25 MG tablet Take 1 tablet (25 mg total) by mouth 2 (two) times daily. (Patient taking differently: Take 25 mg by mouth as needed. ) 60 tablet 2  . Potassium Citrate 15 MEQ (1620 MG) TBCR Take 1 tablet by mouth every evening.   3  . senna-docusate (SENOKOT-S) 8.6-50 MG tablet Take 1 tablet by mouth daily. (Patient taking differently: Take 1 tablet by mouth daily as needed for mild constipation. ) 30 tablet 1  . tamsulosin (FLOMAX) 0.4 MG CAPS capsule Take 0.4 mg by mouth every evening.   3   No current facility-administered medications for this visit.     REVIEW OF SYSTEMS:  [X]  denotes positive finding, [ ]  denotes negative finding Cardiac  Comments:  Chest pain or chest pressure:    Shortness of breath upon exertion:    Short of breath when lying flat:    Irregular heart rhythm:        Vascular    Pain in calf, thigh, or hip brought on by ambulation:    Pain in feet at  night that wakes you up from your sleep:     Blood clot in your veins:    Leg swelling:         Pulmonary    Oxygen at home:    Productive cough:     Wheezing:         Neurologic    Sudden weakness in arms or legs:     Sudden numbness in arms or legs:     Sudden onset of difficulty speaking or slurred speech:    Temporary loss of vision in one eye:     Problems with dizziness:         Gastrointestinal    Blood in stool:     Vomited blood:  Genitourinary    Burning when urinating:     Blood in urine:        Psychiatric    Major depression:         Hematologic    Bleeding problems:    Problems with blood clotting too easily:        Skin    Rashes or ulcers:        Constitutional    Fever or chills:      PHYSICAL EXAM: Vitals:   09/25/18 1519  BP: 133/81  Pulse: 97  Resp: 20  Temp: 98 F (36.7 C)  SpO2: 99%  Weight: (!) 301 lb (136.5 kg)  Height: 6\' 1"  (1.854 m)    GENERAL: NAD CARDIAC: RRR VASCULAR:  Good thrill in left radiocephalic AVF up to mid-forearm  Palpable left radial pulse Incision well healed No tissue loss in fingers PULMONARY: No respiratory distress. DATA:   Duplex today flow volumes have improved over 1144 is now dilated over 5 mm throughout the forearm.  Assessment/Plan:  62 year old male that presents for ongoing follow-up after left radiocephalic fistula in the setting of chronic kidney disease after nephrectomy.  He presents for interval follow-up given that on the initial postop evaluation there was concern for the inflow artery as well as overall low flow volumes in the fistula.  Fortunately on duplex today his cephalic vein is dilated more and is now over 5 mm throughout the forearm with a great thrill and flow volumes are improved.  I think nephrology can use the fistula if needed in the future at any time.  He can follow-up as needed.  Marty Heck, MD [facility] Office:  [phone] Pager: [place]

## 2018-10-07 IMAGING — US US BIOPSY
1 series · 9 of 9 positions shown · non-contrast
Comparison: none

INDICATION: 61-year-old male with a history of right renal mass

[Series 1: us biopsy · 0.28mm/px · 9 of 9 slices shown]
[im 1/9]
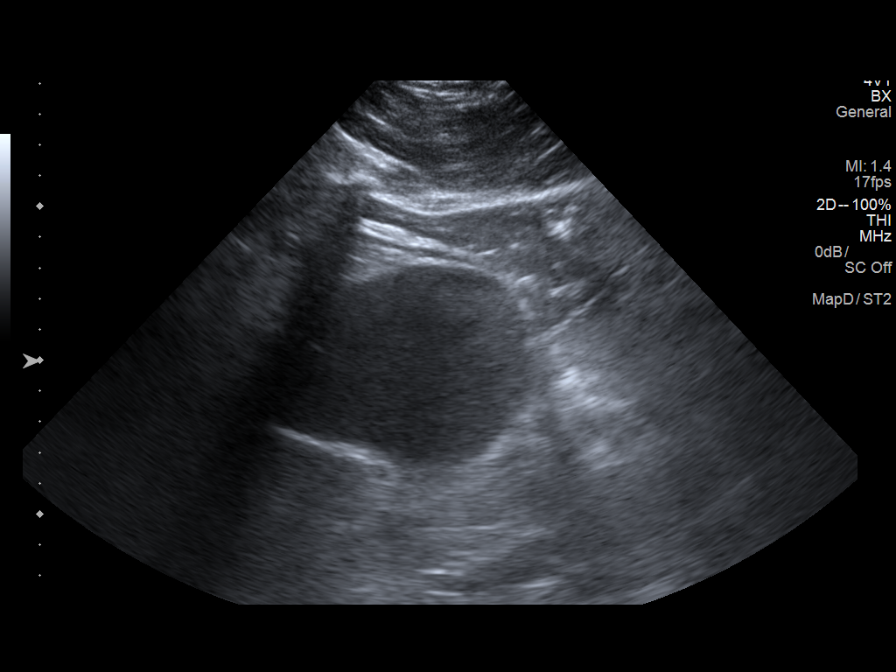
[im 2/9]
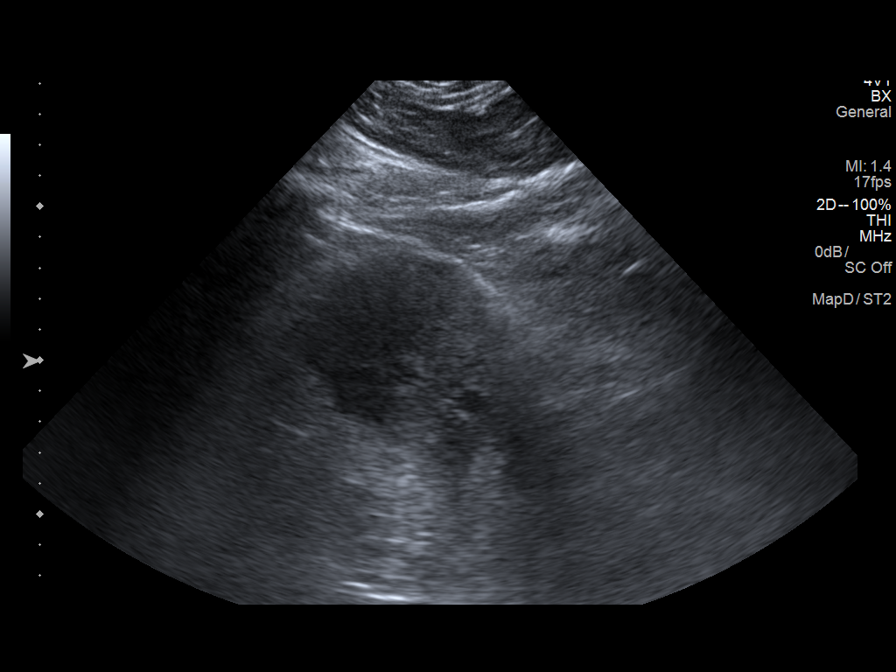
[im 3/9]
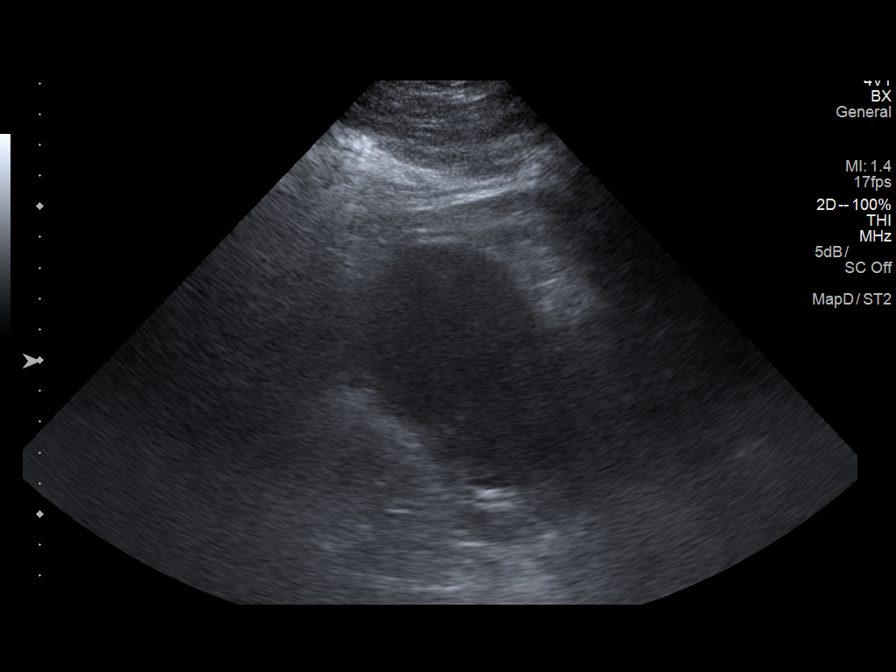
[im 4/9]
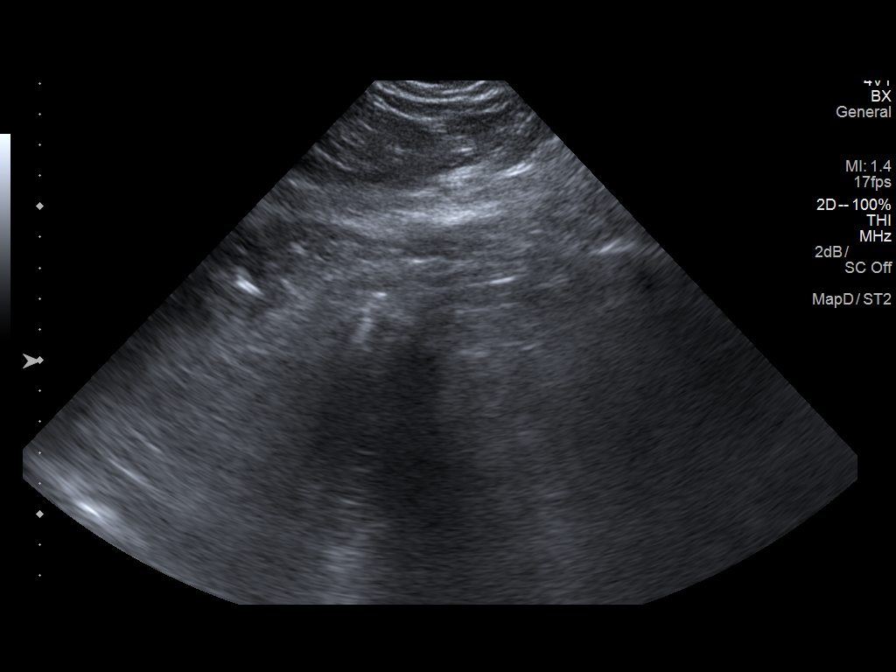
[im 5/9]
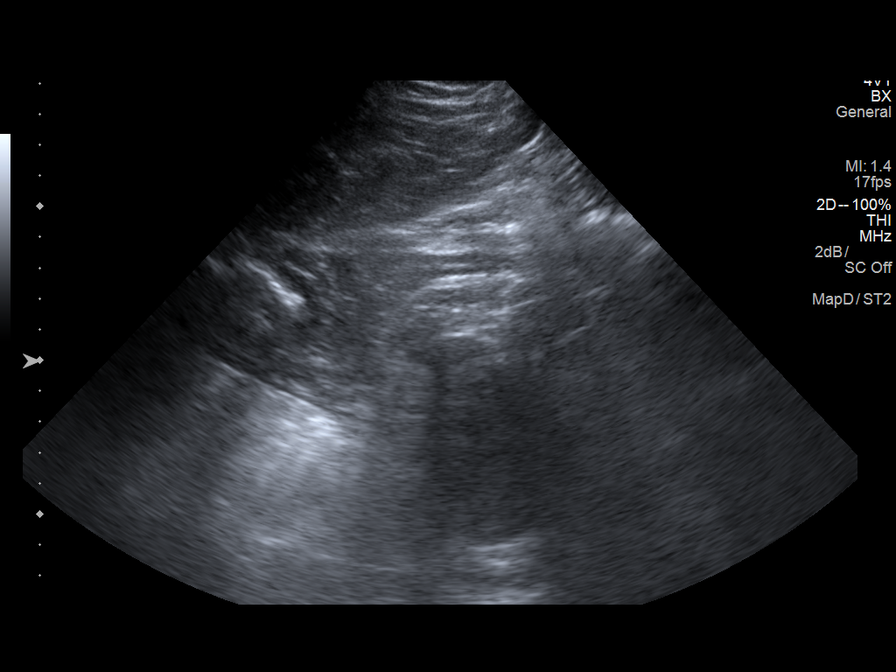
[im 6/9]
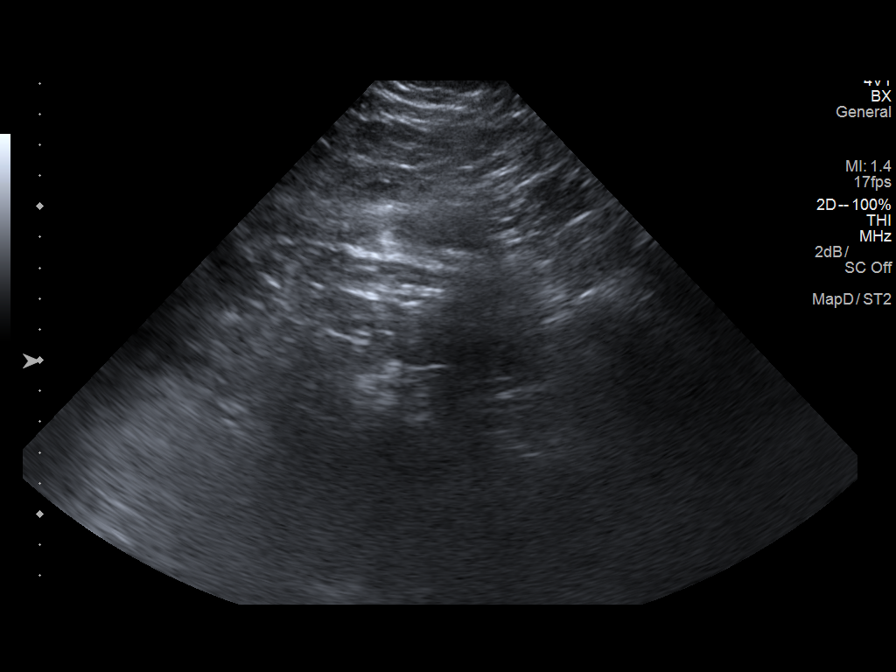
[im 7/9]
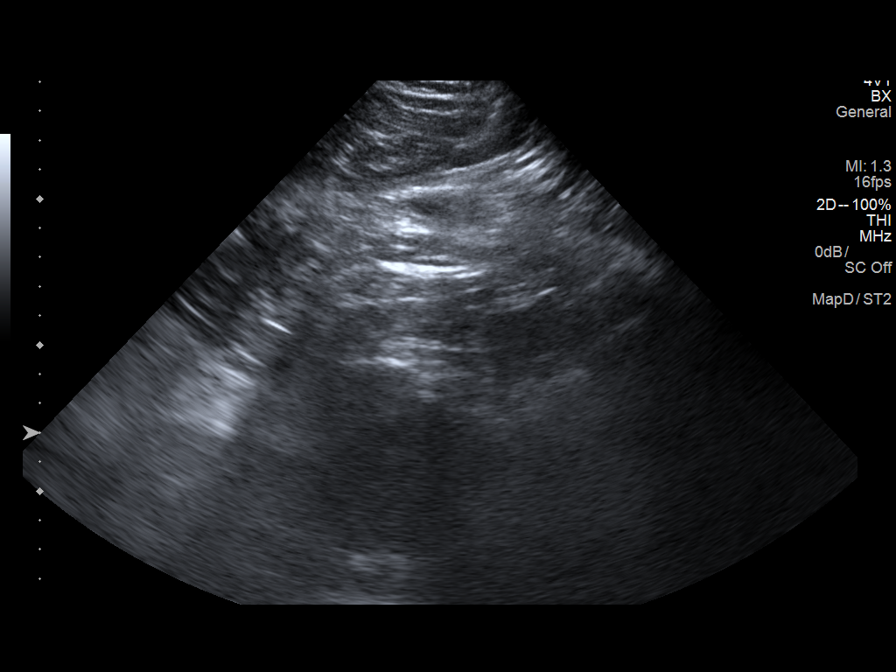
[im 8/9]
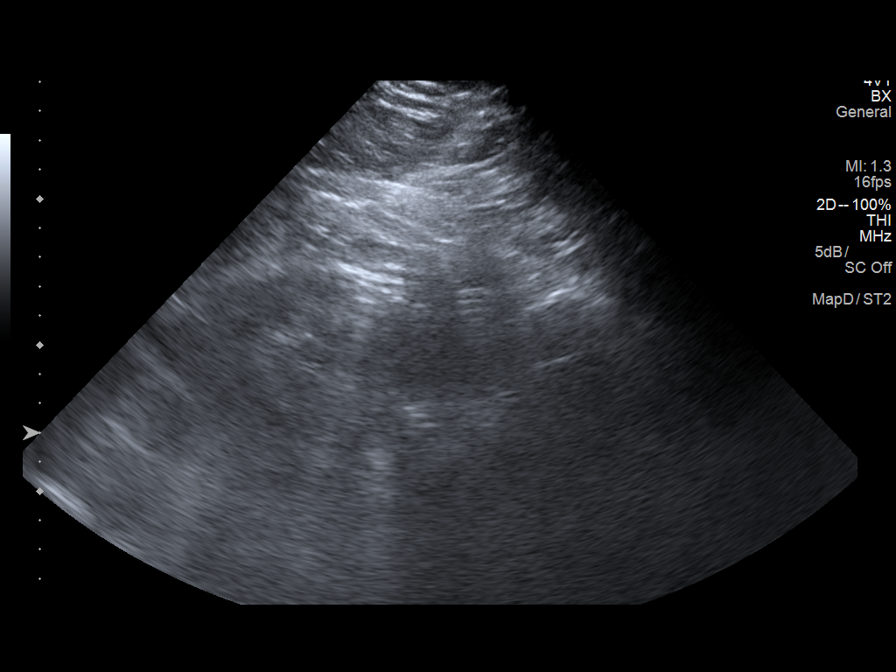
[im 9/9]
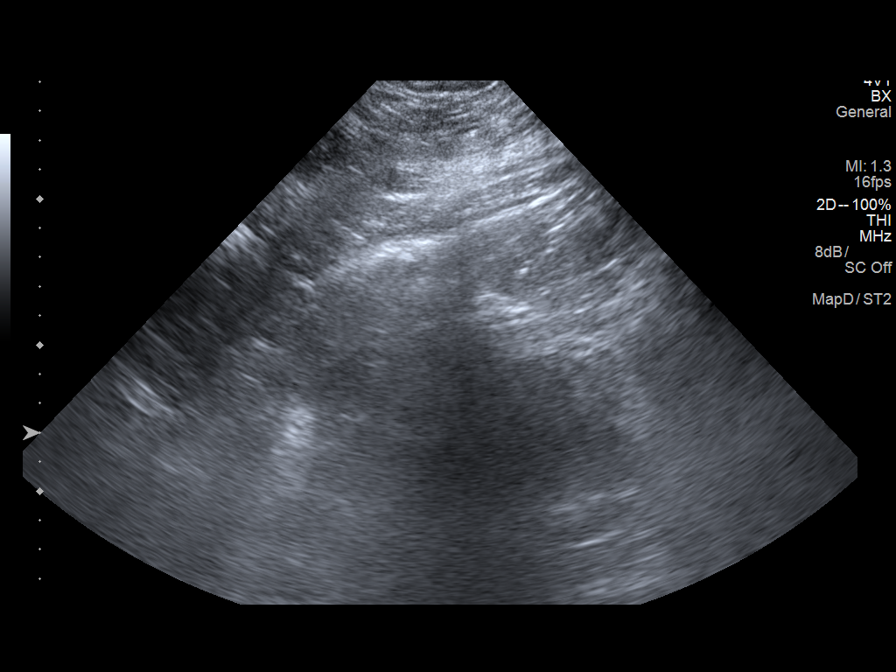

[9 of 9 positions shown; findings below may reference images not displayed]

EXAM:
IMAGE GUIDED BIOPSY RIGHT renal mass

MEDICATIONS:
None.

ANESTHESIA/SEDATION:
Moderate (conscious) sedation was employed during this procedure. A
total of Versed 2.0 mg and Fentanyl 100 mcg was administered
intravenously.

Moderate Sedation Time: 22 minutes. The patient's level of
consciousness and vital signs were monitored continuously by
radiology nursing throughout the procedure under my direct
supervision.

FLUOROSCOPY TIME:  None

COMPLICATIONS:
None

PROCEDURE:
Informed written consent was obtained from the patient after a
thorough discussion of the procedural risks, benefits and
alternatives. All questions were addressed. Maximal Sterile Barrier
Technique was utilized including caps, mask, sterile gowns, sterile
gloves, sterile drape, hand hygiene and skin antiseptic. A timeout
was performed prior to the initiation of the procedure.

Patient was positioned prone position on the gantry table. Images
were stored sent to PACs.

Once the patient is prepped and draped in the usual sterile fashion,
the skin and subcutaneous tissues overlying the right kidney were
generously infiltrated 1% lidocaine for local anesthesia.

Using ultrasound guidance, a 15 gauge guide needle was advanced into
the lower pole lesion of the right kidney.

Once we confirmed location of the needle tip, 3 separate 18 gauge
core biopsy were achieved.

Two Gel-Foam pledgets were infused with a small amount of saline,
then a Gel-Foam slurry was infused. The needle was removed.

Final images were stored.

The patient tolerated the procedure well and remained
hemodynamically stable throughout.

No complications were encountered and no significant blood loss
encountered.
IMPRESSION: Status post ultrasound-guided right renal mass biopsy. Tissue
specimen sent to pathology for complete histopathologic analysis.

## 2018-12-19 ENCOUNTER — Other Ambulatory Visit: Payer: Self-pay

## 2018-12-19 ENCOUNTER — Encounter (HOSPITAL_COMMUNITY): Payer: Self-pay | Admitting: Emergency Medicine

## 2018-12-19 ENCOUNTER — Inpatient Hospital Stay (HOSPITAL_COMMUNITY)
Admission: EM | Admit: 2018-12-19 | Discharge: 2018-12-22 | DRG: 638 | Disposition: A | Discharge status: Home / Self Care | Payer: PRIVATE HEALTH INSURANCE | Attending: Family Medicine | Admitting: Family Medicine

## 2018-12-19 ENCOUNTER — Emergency Department (HOSPITAL_COMMUNITY): Payer: PRIVATE HEALTH INSURANCE

## 2018-12-19 DIAGNOSIS — I10 Essential (primary) hypertension: Secondary | ICD-10-CM | POA: Diagnosis present

## 2018-12-19 DIAGNOSIS — N184 Chronic kidney disease, stage 4 (severe): Secondary | ICD-10-CM | POA: Diagnosis present

## 2018-12-19 DIAGNOSIS — N186 End stage renal disease: Secondary | ICD-10-CM | POA: Diagnosis present

## 2018-12-19 DIAGNOSIS — Z905 Acquired absence of kidney: Secondary | ICD-10-CM

## 2018-12-19 DIAGNOSIS — K760 Fatty (change of) liver, not elsewhere classified: Secondary | ICD-10-CM | POA: Diagnosis present

## 2018-12-19 DIAGNOSIS — D631 Anemia in chronic kidney disease: Secondary | ICD-10-CM | POA: Diagnosis present

## 2018-12-19 DIAGNOSIS — E872 Acidosis, unspecified: Secondary | ICD-10-CM

## 2018-12-19 DIAGNOSIS — Z86711 Personal history of pulmonary embolism: Secondary | ICD-10-CM

## 2018-12-19 DIAGNOSIS — E119 Type 2 diabetes mellitus without complications: Secondary | ICD-10-CM | POA: Diagnosis not present

## 2018-12-19 DIAGNOSIS — E871 Hypo-osmolality and hyponatremia: Secondary | ICD-10-CM

## 2018-12-19 DIAGNOSIS — R911 Solitary pulmonary nodule: Secondary | ICD-10-CM | POA: Diagnosis present

## 2018-12-19 DIAGNOSIS — E669 Obesity, unspecified: Secondary | ICD-10-CM | POA: Diagnosis present

## 2018-12-19 DIAGNOSIS — E1122 Type 2 diabetes mellitus with diabetic chronic kidney disease: Secondary | ICD-10-CM | POA: Diagnosis present

## 2018-12-19 DIAGNOSIS — N179 Acute kidney failure, unspecified: Secondary | ICD-10-CM | POA: Diagnosis not present

## 2018-12-19 DIAGNOSIS — I472 Ventricular tachycardia: Secondary | ICD-10-CM | POA: Diagnosis present

## 2018-12-19 DIAGNOSIS — N400 Enlarged prostate without lower urinary tract symptoms: Secondary | ICD-10-CM | POA: Diagnosis present

## 2018-12-19 DIAGNOSIS — Z87442 Personal history of urinary calculi: Secondary | ICD-10-CM

## 2018-12-19 DIAGNOSIS — K869 Disease of pancreas, unspecified: Secondary | ICD-10-CM | POA: Diagnosis present

## 2018-12-19 DIAGNOSIS — I129 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 kidney disease, or unspecified chronic kidney disease: Secondary | ICD-10-CM | POA: Diagnosis present

## 2018-12-19 DIAGNOSIS — E1110 Type 2 diabetes mellitus with ketoacidosis without coma: Principal | ICD-10-CM | POA: Diagnosis present

## 2018-12-19 DIAGNOSIS — D649 Anemia, unspecified: Secondary | ICD-10-CM

## 2018-12-19 DIAGNOSIS — R7989 Other specified abnormal findings of blood chemistry: Secondary | ICD-10-CM | POA: Diagnosis not present

## 2018-12-19 DIAGNOSIS — Z6839 Body mass index (BMI) 39.0-39.9, adult: Secondary | ICD-10-CM

## 2018-12-19 DIAGNOSIS — Z880 Allergy status to penicillin: Secondary | ICD-10-CM

## 2018-12-19 DIAGNOSIS — E860 Dehydration: Secondary | ICD-10-CM | POA: Diagnosis present

## 2018-12-19 DIAGNOSIS — Z79899 Other long term (current) drug therapy: Secondary | ICD-10-CM

## 2018-12-19 DIAGNOSIS — R739 Hyperglycemia, unspecified: Secondary | ICD-10-CM

## 2018-12-19 DIAGNOSIS — Z7951 Long term (current) use of inhaled steroids: Secondary | ICD-10-CM

## 2018-12-19 DIAGNOSIS — G4733 Obstructive sleep apnea (adult) (pediatric): Secondary | ICD-10-CM | POA: Diagnosis present

## 2018-12-19 DIAGNOSIS — D72828 Other elevated white blood cell count: Secondary | ICD-10-CM | POA: Diagnosis present

## 2018-12-19 DIAGNOSIS — Z85528 Personal history of other malignant neoplasm of kidney: Secondary | ICD-10-CM

## 2018-12-19 LAB — CBC WITH DIFFERENTIAL/PLATELET
Abs Immature Granulocytes: 0.23 10*3/uL — ABNORMAL HIGH (ref 0.00–0.07)
Basophils Absolute: 0.1 10*3/uL (ref 0.0–0.1)
Basophils Relative: 1 %
Eosinophils Absolute: 0.4 10*3/uL (ref 0.0–0.5)
Eosinophils Relative: 3 %
HCT: 35.8 % — ABNORMAL LOW (ref 39.0–52.0)
Hemoglobin: 11.7 g/dL — ABNORMAL LOW (ref 13.0–17.0)
Immature Granulocytes: 2 %
Lymphocytes Relative: 15 %
Lymphs Abs: 2.2 10*3/uL (ref 0.7–4.0)
MCH: 27.9 pg (ref 26.0–34.0)
MCHC: 32.7 g/dL (ref 30.0–36.0)
MCV: 85.2 fL (ref 80.0–100.0)
Monocytes Absolute: 1 10*3/uL (ref 0.1–1.0)
Monocytes Relative: 7 %
Neutro Abs: 10.8 10*3/uL — ABNORMAL HIGH (ref 1.7–7.7)
Neutrophils Relative %: 72 %
Platelets: 278 10*3/uL (ref 150–400)
RBC: 4.2 MIL/uL — ABNORMAL LOW (ref 4.22–5.81)
RDW: 15.1 % (ref 11.5–15.5)
WBC: 14.7 10*3/uL — ABNORMAL HIGH (ref 4.0–10.5)
nRBC: 0 % (ref 0.0–0.2)

## 2018-12-19 LAB — BASIC METABOLIC PANEL
Anion gap: 13 (ref 5–15)
Anion gap: 18 — ABNORMAL HIGH (ref 5–15)
Anion gap: 13 (ref 5–15)
Anion gap: 14 (ref 5–15)
Anion gap: 14 (ref 5–15)
Anion gap: 11 (ref 5–15)
BUN: 79 mg/dL — ABNORMAL HIGH (ref 6–20)
BUN: 77 mg/dL — ABNORMAL HIGH (ref 6–20)
BUN: 74 mg/dL — ABNORMAL HIGH (ref 6–20)
BUN: 74 mg/dL — ABNORMAL HIGH (ref 6–20)
BUN: 73 mg/dL — ABNORMAL HIGH (ref 6–20)
BUN: 73 mg/dL — ABNORMAL HIGH (ref 6–20)
CO2: 15 mmol/L — ABNORMAL LOW (ref 22–32)
CO2: 15 mmol/L — ABNORMAL LOW (ref 22–32)
CO2: 17 mmol/L — ABNORMAL LOW (ref 22–32)
CO2: 18 mmol/L — ABNORMAL LOW (ref 22–32)
CO2: 17 mmol/L — ABNORMAL LOW (ref 22–32)
CO2: 17 mmol/L — ABNORMAL LOW (ref 22–32)
Calcium: 7.9 mg/dL — ABNORMAL LOW (ref 8.9–10.3)
Calcium: 8.5 mg/dL — ABNORMAL LOW (ref 8.9–10.3)
Calcium: 8.6 mg/dL — ABNORMAL LOW (ref 8.9–10.3)
Calcium: 8.7 mg/dL — ABNORMAL LOW (ref 8.9–10.3)
Calcium: 8.2 mg/dL — ABNORMAL LOW (ref 8.9–10.3)
Calcium: 8.2 mg/dL — ABNORMAL LOW (ref 8.9–10.3)
Chloride: 90 mmol/L — ABNORMAL LOW (ref 98–111)
Chloride: 96 mmol/L — ABNORMAL LOW (ref 98–111)
Chloride: 102 mmol/L (ref 98–111)
Chloride: 101 mmol/L (ref 98–111)
Chloride: 101 mmol/L (ref 98–111)
Chloride: 103 mmol/L (ref 98–111)
Creatinine, Ser: 4.93 mg/dL — ABNORMAL HIGH (ref 0.61–1.24)
Creatinine, Ser: 4.79 mg/dL — ABNORMAL HIGH (ref 0.61–1.24)
Creatinine, Ser: 4.47 mg/dL — ABNORMAL HIGH (ref 0.61–1.24)
Creatinine, Ser: 4.8 mg/dL — ABNORMAL HIGH (ref 0.61–1.24)
Creatinine, Ser: 4.77 mg/dL — ABNORMAL HIGH (ref 0.61–1.24)
Creatinine, Ser: 4.48 mg/dL — ABNORMAL HIGH (ref 0.61–1.24)
GFR calc Af Amer: 14 mL/min — ABNORMAL LOW (ref >60)
GFR calc Af Amer: 14 mL/min — ABNORMAL LOW (ref >60)
GFR calc Af Amer: 15 mL/min — ABNORMAL LOW (ref >60)
GFR calc Af Amer: 14 mL/min — ABNORMAL LOW (ref >60)
GFR calc Af Amer: 14 mL/min — ABNORMAL LOW (ref >60)
GFR calc Af Amer: 15 mL/min — ABNORMAL LOW (ref >60)
GFR calc non Af Amer: 12 mL/min — ABNORMAL LOW (ref >60)
GFR calc non Af Amer: 12 mL/min — ABNORMAL LOW (ref >60)
GFR calc non Af Amer: 13 mL/min — ABNORMAL LOW (ref >60)
GFR calc non Af Amer: 12 mL/min — ABNORMAL LOW (ref >60)
GFR calc non Af Amer: 12 mL/min — ABNORMAL LOW (ref >60)
GFR calc non Af Amer: 13 mL/min — ABNORMAL LOW (ref >60)
Glucose, Bld: 845 mg/dL (ref 70–99)
Glucose, Bld: 423 mg/dL — ABNORMAL HIGH (ref 70–99)
Glucose, Bld: 229 mg/dL — ABNORMAL HIGH (ref 70–99)
Glucose, Bld: 180 mg/dL — ABNORMAL HIGH (ref 70–99)
Glucose, Bld: 204 mg/dL — ABNORMAL HIGH (ref 70–99)
Glucose, Bld: 223 mg/dL — ABNORMAL HIGH (ref 70–99)
Potassium: 4.8 mmol/L (ref 3.5–5.1)
Potassium: 3.9 mmol/L (ref 3.5–5.1)
Potassium: 3.8 mmol/L (ref 3.5–5.1)
Potassium: 4 mmol/L (ref 3.5–5.1)
Potassium: 3.9 mmol/L (ref 3.5–5.1)
Potassium: 3.7 mmol/L (ref 3.5–5.1)
Sodium: 118 mmol/L — CL (ref 135–145)
Sodium: 129 mmol/L — ABNORMAL LOW (ref 135–145)
Sodium: 132 mmol/L — ABNORMAL LOW (ref 135–145)
Sodium: 133 mmol/L — ABNORMAL LOW (ref 135–145)
Sodium: 132 mmol/L — ABNORMAL LOW (ref 135–145)
Sodium: 131 mmol/L — ABNORMAL LOW (ref 135–145)

## 2018-12-19 LAB — GLUCOSE, CAPILLARY
Glucose-Capillary: >600 mg/dL (ref 70–99)
Glucose-Capillary: 511 mg/dL (ref 70–99)
Glucose-Capillary: >600 mg/dL (ref 70–99)
Glucose-Capillary: 315 mg/dL — ABNORMAL HIGH (ref 70–99)
Glucose-Capillary: 197 mg/dL — ABNORMAL HIGH (ref 70–99)
Glucose-Capillary: 216 mg/dL — ABNORMAL HIGH (ref 70–99)
Glucose-Capillary: 194 mg/dL — ABNORMAL HIGH (ref 70–99)
Glucose-Capillary: 189 mg/dL — ABNORMAL HIGH (ref 70–99)
Glucose-Capillary: 205 mg/dL — ABNORMAL HIGH (ref 70–99)
Glucose-Capillary: 170 mg/dL — ABNORMAL HIGH (ref 70–99)
Glucose-Capillary: 182 mg/dL — ABNORMAL HIGH (ref 70–99)
Glucose-Capillary: 179 mg/dL — ABNORMAL HIGH (ref 70–99)
Glucose-Capillary: 136 mg/dL — ABNORMAL HIGH (ref 70–99)
Glucose-Capillary: 136 mg/dL — ABNORMAL HIGH (ref 70–99)
Glucose-Capillary: 159 mg/dL — ABNORMAL HIGH (ref 70–99)
Glucose-Capillary: 177 mg/dL — ABNORMAL HIGH (ref 70–99)
Glucose-Capillary: 241 mg/dL — ABNORMAL HIGH (ref 70–99)
Glucose-Capillary: 181 mg/dL — ABNORMAL HIGH (ref 70–99)
Glucose-Capillary: 208 mg/dL — ABNORMAL HIGH (ref 70–99)
Glucose-Capillary: 201 mg/dL — ABNORMAL HIGH (ref 70–99)

## 2018-12-19 LAB — COMPREHENSIVE METABOLIC PANEL
ALT: 7 U/L (ref 0–44)
AST: 16 U/L (ref 15–41)
Albumin: 3 g/dL — ABNORMAL LOW (ref 3.5–5.0)
Alkaline Phosphatase: 128 U/L — ABNORMAL HIGH (ref 38–126)
Anion gap: 16 — ABNORMAL HIGH (ref 5–15)
BUN: 80 mg/dL — ABNORMAL HIGH (ref 6–20)
CO2: 15 mmol/L — ABNORMAL LOW (ref 22–32)
Calcium: 8.3 mg/dL — ABNORMAL LOW (ref 8.9–10.3)
Chloride: 85 mmol/L — ABNORMAL LOW (ref 98–111)
Creatinine, Ser: 4.86 mg/dL — ABNORMAL HIGH (ref 0.61–1.24)
GFR calc Af Amer: 14 mL/min — ABNORMAL LOW (ref >60)
GFR calc non Af Amer: 12 mL/min — ABNORMAL LOW (ref >60)
Glucose, Bld: 929 mg/dL (ref 70–99)
Potassium: 5 mmol/L (ref 3.5–5.1)
Sodium: 116 mmol/L — CL (ref 135–145)
Total Bilirubin: 0.5 mg/dL (ref 0.3–1.2)
Total Protein: 7.5 g/dL (ref 6.5–8.1)

## 2018-12-19 LAB — POCT I-STAT EG7
Acid-base deficit: 9 mmol/L — ABNORMAL HIGH (ref 0.0–2.0)
Bicarbonate: 17.1 mmol/L — ABNORMAL LOW (ref 20.0–28.0)
Calcium, Ion: 1.11 mmol/L — ABNORMAL LOW (ref 1.15–1.40)
HCT: 36 % — ABNORMAL LOW (ref 39.0–52.0)
Hemoglobin: 12.2 g/dL — ABNORMAL LOW (ref 13.0–17.0)
O2 Saturation: 56 %
Potassium: 5 mmol/L (ref 3.5–5.1)
Sodium: 117 mmol/L — CL (ref 135–145)
TCO2: 18 mmol/L — ABNORMAL LOW (ref 22–32)
pCO2, Ven: 35.9 mmHg — ABNORMAL LOW (ref 44.0–60.0)
pH, Ven: 7.286 (ref 7.250–7.430)
pO2, Ven: 32 mmHg (ref 32.0–45.0)

## 2018-12-19 LAB — URINALYSIS, ROUTINE W REFLEX MICROSCOPIC
Bacteria, UA: NONE SEEN
Bilirubin Urine: NEGATIVE
Glucose, UA: >=500 mg/dL — AB
Ketones, ur: NEGATIVE mg/dL
Nitrite: NEGATIVE
Protein, ur: 30 mg/dL — AB
Specific Gravity, Urine: 1.014 (ref 1.005–1.030)
WBC, UA: >50 WBC/hpf — ABNORMAL HIGH (ref 0–5)
pH: 6 (ref 5.0–8.0)

## 2018-12-19 LAB — CBG MONITORING, ED
Glucose-Capillary: >600 mg/dL (ref 70–99)
Glucose-Capillary: >600 mg/dL (ref 70–99)

## 2018-12-19 LAB — CREATININE, URINE, RANDOM: Creatinine, Urine: 44.6 mg/dL

## 2018-12-19 LAB — PHOSPHORUS: Phosphorus: 5.3 mg/dL — ABNORMAL HIGH (ref 2.5–4.6)

## 2018-12-19 LAB — HEMOGLOBIN A1C
Hgb A1c MFr Bld: 14.9 % — ABNORMAL HIGH (ref 4.8–5.6)
Mean Plasma Glucose: 381 mg/dL

## 2018-12-19 LAB — LACTIC ACID, PLASMA: Lactic Acid, Venous: 1.5 mmol/L (ref 0.5–1.9)

## 2018-12-19 LAB — SODIUM, URINE, RANDOM: Sodium, Ur: 18 mmol/L

## 2018-12-19 LAB — BETA-HYDROXYBUTYRIC ACID: Beta-Hydroxybutyric Acid: 0.1 mmol/L (ref 0.05–0.27)

## 2018-12-19 LAB — MAGNESIUM: Magnesium: 1.7 mg/dL (ref 1.7–2.4)

## 2018-12-19 MED ORDER — DEXTROSE-NACL 5-0.45 % IV SOLN
INTRAVENOUS | Status: DC
  Administered 2018-12-19 (×2): via INTRAVENOUS

## 2018-12-19 MED ORDER — SODIUM CHLORIDE 0.9 % IV SOLN
INTRAVENOUS | Status: DC
  Administered 2018-12-19: 04:00:00 via INTRAVENOUS

## 2018-12-19 MED ORDER — INSULIN ASPART 100 UNIT/ML IV SOLN
10.0000 [IU] | Freq: Once | INTRAVENOUS | Status: AC
  Administered 2018-12-19: 10 [IU] via INTRAVENOUS

## 2018-12-19 MED ORDER — LIVING WELL WITH DIABETES BOOK
Freq: Once | Status: AC
  Administered 2018-12-19: 16:00:00

## 2018-12-19 MED ORDER — HEPARIN SODIUM (PORCINE) 5000 UNIT/ML IJ SOLN
5000.0000 [IU] | Freq: Three times a day (TID) | INTRAMUSCULAR | Status: DC
  Administered 2018-12-19 – 2018-12-22 (×10): 5000 [IU] via SUBCUTANEOUS
  Filled 2018-12-19 (×10): qty 1

## 2018-12-19 MED ORDER — SODIUM CHLORIDE 0.9 % IV BOLUS
500.0000 mL | Freq: Once | INTRAVENOUS | Status: AC
  Administered 2018-12-19: 500 mL via INTRAVENOUS

## 2018-12-19 MED ORDER — INSULIN STARTER KIT- PEN NEEDLES (ENGLISH)
1.0000 | Freq: Once | Status: AC
  Administered 2018-12-19: 1

## 2018-12-19 MED ORDER — DEXTROSE 50 % IV SOLN: 25.0000 mL | INTRAVENOUS | Status: DC | PRN

## 2018-12-19 MED ORDER — INSULIN REGULAR(HUMAN) IN NACL 100-0.9 UT/100ML-% IV SOLN
INTRAVENOUS | Status: DC
  Administered 2018-12-19: 5.4 [IU]/h via INTRAVENOUS
  Filled 2018-12-19: qty 100

## 2018-12-19 MED ORDER — INSULIN REGULAR(HUMAN) IN NACL 100-0.9 UT/100ML-% IV SOLN
INTRAVENOUS | Status: AC
  Administered 2018-12-19: 9 [IU]/h via INTRAVENOUS
  Administered 2018-12-19: 03:00:00 10.8 [IU]/h via INTRAVENOUS
  Administered 2018-12-20: 10.3 [IU]/h via INTRAVENOUS
  Filled 2018-12-19 (×2): qty 100

## 2018-12-19 MED ORDER — INSULIN REGULAR BOLUS VIA INFUSION
0.0000 [IU] | Freq: Three times a day (TID) | INTRAVENOUS | Status: DC
  Filled 2018-12-19: qty 10

## 2018-12-19 MED ORDER — DEXTROSE-NACL 5-0.45 % IV SOLN: INTRAVENOUS | Status: DC

## 2018-12-19 MED ORDER — SODIUM CHLORIDE 0.9 % IV SOLN
INTRAVENOUS | Status: DC
  Administered 2018-12-19: 02:00:00 via INTRAVENOUS

## 2018-12-19 MED ORDER — SODIUM CHLORIDE 0.9 % IV BOLUS
1000.0000 mL | Freq: Once | INTRAVENOUS | Status: AC
  Administered 2018-12-19: 1000 mL via INTRAVENOUS

## 2018-12-19 NOTE — ED Notes (Addendum)
ED TO INPATIENT HANDOFF REPORT  ED Nurse Name and Phone #:  Mylan [phone]  S Name/Age/Gender George Lewis. 63 y.o. male Room/Bed: 030C/030C  Code Status   Code Status: Prior  Home/SNF/Other Home Patient oriented to: self, place, time and situation Is this baseline? Yes   Triage Complete: Triage complete  Chief Complaint glucose and sodium is high and weakness  Triage Note Pt sent by PCP for hyperglycemia and hyponatremia. Reports Na-121 and glu- over 700. Pt c/o generalized weakness, increased thirst and urination.   Allergies Allergies  Allergen Reactions  . Penicillins Rash and Other (See Comments)    Has patient had a PCN reaction causing immediate rash, facial/tongue/throat swelling, SOB or lightheadedness with hypotension: yes Has patient had a PCN reaction causing severe rash involving mucus membranes or skin necrosis: no Has patient had a PCN reaction that required hospitalization: no Has patient had a PCN reaction occurring within the last 10 years: no If all of the above answers are "NO", then may proceed with Cephalosporin use.     Level of Care/Admitting Diagnosis ED Disposition    ED Disposition Condition [facility] Area: [place] [[id number]]  Level of Care: Progressive [102]  I expect the patient will be discharged within 24 hours: No (not a candidate for 5C-Observation unit)  Covid Evaluation: N/A  Diagnosis: DKA (diabetic ketoacidoses) ([place]) [009381]  Admitting Physician: Vianne Bulls [[id number]]  Attending Physician: Vianne Bulls [[id number]]  PT Class (Do Not Modify): Observation [104]  PT Acc Code (Do Not Modify): Observation [10022]       B Medical/Surgery History Past Medical History:  Diagnosis Date  . Arthritis    knees  . Bilateral renal cysts 03/23/2018   Noted MRI ABD  . Cholelithiasis 03/19/2018   noted on CT AB/Pelvis  . CKD (chronic kidney disease), stage IV [facility])    nephrologist--  dr Hollie Salk [name]);  05-01-2018 has not started dialysis, scheduled for AV fistula creation 05-07-2018  . Diverticulosis of colon 04/19/2018   Noted on CT abd/pelvis  . GERD (gastroesophageal reflux disease)   . Hepatic steatosis 03/19/2018   Mild diffuse, noted on CT AB/Pelvis  . History of kidney stones   . History of pulmonary embolus (PE) 03/2008   treated with coumadin for 6 months  . History of sepsis 04/20/2018   per discharge note , probable UTI  . Hypertension   . Nocturia   . OSA on CPAP   . Pancreatic lesion    0.4cm cystic per CT 07/ 2019  . Pneumonia   . Pulmonary nodule    Solid 40mm Right Lower Lobe  . Right renal mass 04/10/2018   new dx--  scheduled for nephrectomy 05-16-2018  . S/P ureteral stent placement: 04/16/2018 04/19/2018   Past Surgical History:  Procedure Laterality Date  . AV FISTULA PLACEMENT Left 05/07/2018   Procedure: Creation of Left arm Radiocephalic Fistula;  Surgeon: Marty Heck, MD;  Location: [place];  Service: Vascular;  Laterality: Left;  . CYSTOSCOPY/RETROGRADE/URETEROSCOPY/STONE EXTRACTION WITH BASKET  x2 last one 1990s approx.  . CYSTOSCOPY/URETEROSCOPY/HOLMIUM LASER/STENT PLACEMENT Left 04/16/2018   Procedure: CYSTOSCOPY/LEFT URETEROSCOPY/LEFT RETROGRADE/STENT PLACEMENT;  Surgeon: Lucas Mallow, MD;  [facility];  Service: Urology;  Laterality: Left;  . EUS N/A 05/03/2018   Procedure: UPPER ENDOSCOPIC ULTRASOUND (EUS) RADIAL;  Surgeon: Milus Banister, MD;  [facility];  Service: Gastroenterology;  Laterality: N/A;  . EUS N/A 05/03/2018  Procedure: UPPER ENDOSCOPIC ULTRASOUND (EUS) LINEAR;  Surgeon: Milus Banister, MD;  [facility];  Service: Gastroenterology;  Laterality: N/A;  . EXTRACORPOREAL SHOCK WAVE LITHOTRIPSY  x3  last one 2004 approx.  Marland Kitchen FINE NEEDLE ASPIRATION N/A 05/03/2018   Procedure: FINE NEEDLE ASPIRATION (FNA) LINEAR;  Surgeon: Milus Banister, MD;  [facility];  Service:  Gastroenterology;  Laterality: N/A;  . LAPAROSCOPIC NEPHRECTOMY, HAND ASSISTED Right 05/16/2018   Procedure: HAND ASSISTED LAPAROSCOPIC RIGHT NEPHRECTOMY;  Surgeon: Lucas Mallow, MD;  [facility];  Service: Urology;  Laterality: Right;  . left index finger attachment  1980s   3-4 surgeries  . TOE SURGERY  1980s   in beween 2nd and 3rd toes cyst removed     A IV Location/Drains/Wounds Patient Lines/Drains/Airways Status   Active Line/Drains/Airways    Name:   Placement date:   Placement time:   Site:   Days:   Peripheral IV 06/12/18 Right Hand   06/12/18    1300    Hand   190   Peripheral IV 06/12/18 Right Antecubital   06/12/18    1451    Antecubital   190   Peripheral IV 12/19/18 Right Arm   12/19/18    0045    Arm   less than 1   Fistula / Graft Left Forearm Arteriovenous fistula   05/07/18    0811    Forearm   226   Ureteral Drain/Stent Left ureter 6 Fr.   04/16/18    1258    Left ureter   247   Incision (Closed) 05/07/18 Arm Left   05/07/18    0809     226   Incision (Closed) 05/16/18 Abdomen   05/16/18    1045     217   Incision - 3 Ports Abdomen 1: Right;Superior 2: Right;Upper 3: Right;Lateral;Lower   05/16/18    0930     217          Intake/Output Last 24 hours No intake or output data in the 24 hours ending 12/19/18 0209  Labs/Imaging Results for orders placed or performed during the hospital encounter of 12/19/18 (from the past 48 hour(s))  CBG monitoring, ED     Status: Abnormal   Collection Time: 12/19/18 12:39 AM  Result Value Ref Range   Glucose-Capillary >600 (HH) 70 - 99 mg/dL  Comprehensive metabolic panel     Status: Abnormal   Collection Time: 12/19/18 12:48 AM  Result Value Ref Range   Sodium 116 (LL) 135 - 145 mmol/L    Comment: CRITICAL RESULT CALLED TO, READ BACK BY AND VERIFIED WITH: BROOKS,M 12/19/2018 AT 0140 BY SOEWARDIMAN,H    Potassium 5.0 3.5 - 5.1 mmol/L   Chloride 85 (L) 98 - 111 mmol/L   CO2 15 (L) 22 - 32 mmol/L   Glucose, Bld  929 (HH) 70 - 99 mg/dL    Comment: CRITICAL RESULT CALLED TO, READ BACK BY AND VERIFIED WITH: BROOKS,M 12/19/2018 AT 0140 BY SOEWARDIMAN,H    BUN 80 (H) 6 - 20 mg/dL   Creatinine, Ser 4.86 (H) 0.61 - 1.24 mg/dL   Calcium 8.3 (L) 8.9 - 10.3 mg/dL   Total Protein 7.5 6.5 - 8.1 g/dL   Albumin 3.0 (L) 3.5 - 5.0 g/dL   AST 16 15 - 41 U/L   ALT 7 0 - 44 U/L   Alkaline Phosphatase 128 (H) 38 - 126 U/L   Total Bilirubin 0.5 0.3 - 1.2 mg/dL  GFR calc non Af Amer 12 (L) >60 mL/min   GFR calc Af Amer 14 (L) >60 mL/min   Anion gap 16 (H) 5 - 15    Comment: Performed at [facility] [address] [place], [place] [address]  CBC with Differential     Status: Abnormal   Collection Time: 12/19/18 12:48 AM  Result Value Ref Range   WBC 14.7 (H) 4.0 - 10.5 K/uL   RBC 4.20 (L) 4.22 - 5.81 MIL/uL   Hemoglobin 11.7 (L) 13.0 - 17.0 g/dL   HCT 35.8 (L) 39.0 - 52.0 %   MCV 85.2 80.0 - 100.0 fL   MCH 27.9 26.0 - 34.0 pg   MCHC 32.7 30.0 - 36.0 g/dL   RDW 15.1 11.5 - 15.5 %   Platelets 278 150 - 400 K/uL   nRBC 0.0 0.0 - 0.2 %   Neutrophils Relative % 72 %   Neutro Abs 10.8 (H) 1.7 - 7.7 K/uL   Lymphocytes Relative 15 %   Lymphs Abs 2.2 0.7 - 4.0 K/uL   Monocytes Relative 7 %   Monocytes Absolute 1.0 0.1 - 1.0 K/uL   Eosinophils Relative 3 %   Eosinophils Absolute 0.4 0.0 - 0.5 K/uL   Basophils Relative 1 %   Basophils Absolute 0.1 0.0 - 0.1 K/uL   Immature Granulocytes 2 %   Abs Immature Granulocytes 0.23 (H) 0.00 - 0.07 K/uL    Comment: Performed at [facility], [address], [place], [place] [address]  Magnesium     Status: None   Collection Time: 12/19/18 12:48 AM  Result Value Ref Range   Magnesium 1.7 1.7 - 2.4 mg/dL    Comment: Performed at [facility] [address], [place], [place] [address]  Phosphorus     Status: Abnormal   Collection Time: 12/19/18 12:48 AM  Result Value Ref Range   Phosphorus 5.3 (H) 2.5 - 4.6 mg/dL    Comment: Performed at [facility] [address], [place], [place] [address]  POCT I-Stat EG7     Status: Abnormal   Collection Time: 12/19/18 12:55 AM  Result Value Ref Range   pH, Ven 7.286 7.250 - 7.430   pCO2, Ven 35.9 (L) 44.0 - 60.0 mmHg   pO2, Ven 32.0 32.0 - 45.0 mmHg   Bicarbonate 17.1 (L) 20.0 - 28.0 mmol/L   TCO2 18 (L) 22 - 32 mmol/L   O2 Saturation 56.0 %   Acid-base deficit 9.0 (H) 0.0 - 2.0 mmol/L   Sodium 117 (LL) 135 - 145 mmol/L   Potassium 5.0 3.5 - 5.1 mmol/L   Calcium, Ion 1.11 (L) 1.15 - 1.40 mmol/L   HCT 36.0 (L) 39.0 - 52.0 %   Hemoglobin 12.2 (L) 13.0 - 17.0 g/dL   Patient temperature HIDE    Sample type VENOUS    Comment NOTIFIED PHYSICIAN   CBG monitoring, ED     Status: Abnormal   Collection Time: 12/19/18  2:02 AM  Result Value Ref Range   Glucose-Capillary >600 (HH) 70 - 99 mg/dL   Dg Chest Port 1 View  Result Date: 12/19/2018 CLINICAL DATA:  Weakness EXAM: PORTABLE CHEST 1 VIEW COMPARISON:  06/12/2018 FINDINGS: Heart and mediastinal contours are within normal limits. No focal opacities or effusions. No acute bony abnormality. IMPRESSION: No active disease. Electronically Signed   By: Rolm Baptise M.D.   On: 12/19/2018 01:09    Pending Labs Unresulted Labs (From admission,  onward)    Start     Ordered   12/19/18 0048  Urinalysis, Routine w reflex microscopic  ONCE - STAT,   STAT     12/19/18 0048          Vitals/Pain Today's Vitals   12/19/18 0006 12/19/18 0106 12/19/18 0130 12/19/18 0202  BP: 117/65  (!) 109/59 117/72  Pulse: (!) 121  97 92  Resp: 16  14 13   Temp: 98.4 F (36.9 C)     TempSrc: Oral     SpO2: 96%  97% 98%  PainSc:  0-No pain      Isolation Precautions No active isolations  Medications Medications  dextrose 5 %-0.45 % sodium chloride infusion (has no administration in time range)  insulin regular bolus via infusion 0-10 Units (has no administration in time range)  insulin regular, human (MYXREDLIN) 100 units/ 100 mL infusion (5.4  Units/hr Intravenous New Bag/Given 12/19/18 0207)  dextrose 50 % solution 25 mL (has no administration in time range)  0.9 %  sodium chloride infusion ( Intravenous New Bag/Given 12/19/18 0207)  sodium chloride 0.9 % bolus 1,000 mL (1,000 mLs Intravenous New Bag/Given 12/19/18 0100)  insulin aspart (novoLOG) injection 10 Units (10 Units Intravenous Given 12/19/18 0105)    Mobility walks Low fall risk   Focused Assessments Cardiac Assessment Handoff:  Cardiac Rhythm: Normal sinus rhythm Lab Results  Component Value Date   TROPONINI <0.03 06/12/2018   No results found for: DDIMER Does the Patient currently have chest pain? No    Left arm restricted extremity - not being dialyzed yet, but fistula in place for when he does    R Recommendations: See Admitting Provider Note  Report given to:   Additional Notes:  New onset diabetes, previous right kidney cancer with nephrectomy (6 mos ago), scheduled for follow up CT on Thursday for "spots on pancreas and lungs"

## 2018-12-19 NOTE — ED Notes (Signed)
Dr Roxanne Mins informed of I Stat EG7 results

## 2018-12-19 NOTE — Progress Notes (Signed)
Received report from [place], RN in the ED.

## 2018-12-19 NOTE — ED Triage Notes (Signed)
Pt sent by PCP for hyperglycemia and hyponatremia. Reports Na-121 and glu- over 700. Pt c/o generalized weakness, increased thirst and urination.

## 2018-12-19 NOTE — Progress Notes (Signed)
Verification of insulin gtt dosage change with bedside RN Bettina Gavia

## 2018-12-19 NOTE — Consult Note (Signed)
[name] Renal Consultation Note  Requesting MD: Dan[place] Indication for Consultation:  CKD, acidosis  Chief complaint: weakness  HPI:  George Lewis. is a 63 y.o. male with a history of obesity, renal cell carcinoma status post nephrectomy, CKD stage IV followed by our office with Dr. Hollie Salk who presented with weakness.  He was found on outpatient labs to have markedly elevated glucose and DKA.  He has been initiated on fluids and insulin.  Note that creatinine was 3.8 in 05/2018.  Creatinine was also elevated at 4.86 on admission.  It is improved to 4.47 on most recent BMP.  Note that he does have a fistula in place. He last saw Dr. Hollie Salk on 09/17/18 and creatinine was 3.4 at that time with concern for CKD progression.  He denies any shortness of breath or nausea.  He has been making good urine output per his report.  Creatinine, Ser  Date/Time Value Ref Range Status  12/19/2018 10:12 AM 4.47 (H) 0.61 - 1.24 mg/dL Final  12/19/2018 06:06 AM 4.79 (H) 0.61 - 1.24 mg/dL Final  12/19/2018 02:38 AM 4.93 (H) 0.61 - 1.24 mg/dL Final  12/19/2018 12:48 AM 4.86 (H) 0.61 - 1.24 mg/dL Final  06/13/2018 05:13 AM 3.80 (H) 0.61 - 1.24 mg/dL Final  06/12/2018 02:26 PM 3.83 (H) 0.61 - 1.24 mg/dL Final  05/21/2018 05:39 AM 3.73 (H) 0.61 - 1.24 mg/dL Final  05/20/2018 05:15 AM 3.71 (H) 0.61 - 1.24 mg/dL Final  05/19/2018 05:33 AM 3.86 (H) 0.61 - 1.24 mg/dL Final  05/18/2018 05:19 AM 3.66 (H) 0.61 - 1.24 mg/dL Final  05/17/2018 08:37 AM 3.31 (H) 0.61 - 1.24 mg/dL Final  05/17/2018 03:35 AM 3.12 (H) 0.61 - 1.24 mg/dL Final  05/16/2018 11:39 AM 2.62 (H) 0.61 - 1.24 mg/dL Final  05/01/2018 12:13 PM 2.45 (H) 0.61 - 1.24 mg/dL Final  04/23/2018 05:07 AM 2.36 (H) 0.61 - 1.24 mg/dL Final  04/22/2018 05:06 AM 2.57 (H) 0.61 - 1.24 mg/dL Final  04/21/2018 03:37 AM 2.66 (H) 0.61 - 1.24 mg/dL Final  04/20/2018 03:39 AM 2.53 (H) 0.61 - 1.24 mg/dL Final  04/19/2018 03:44 PM 2.52 (H) 0.61 - 1.24  mg/dL Final  04/16/2018 12:00 PM 2.34 (H) 0.61 - 1.24 mg/dL Final     PMHx:   Past Medical History:  Diagnosis Date  . Arthritis    knees  . Bilateral renal cysts 03/23/2018   Noted MRI ABD  . Cholelithiasis 03/19/2018   noted on CT AB/Pelvis  . CKD (chronic kidney disease), stage IV [facility])    nephrologist-- dr Hollie Salk [name]);  05-01-2018 has not started dialysis, scheduled for AV fistula creation 05-07-2018  . Diverticulosis of colon 04/19/2018   Noted on CT abd/pelvis  . GERD (gastroesophageal reflux disease)   . Hepatic steatosis 03/19/2018   Mild diffuse, noted on CT AB/Pelvis  . History of kidney stones   . History of pulmonary embolus (PE) 03/2008   treated with coumadin for 6 months  . History of sepsis 04/20/2018   per discharge note , probable UTI  . Hypertension   . Nocturia   . OSA on CPAP   . Pancreatic lesion    0.4cm cystic per CT 07/ 2019  . Pneumonia   . Pulmonary nodule    Solid 36mm Right Lower Lobe  . Right renal mass 04/10/2018   new dx--  scheduled for nephrectomy 05-16-2018  . S/P ureteral stent placement: 04/16/2018 04/19/2018    Past Surgical History:  Procedure Laterality  Date  . AV FISTULA PLACEMENT Left 05/07/2018   Procedure: Creation of Left arm Radiocephalic Fistula;  Surgeon: Marty Heck, MD;  Location: [place];  Service: Vascular;  Laterality: Left;  . CYSTOSCOPY/RETROGRADE/URETEROSCOPY/STONE EXTRACTION WITH BASKET  x2 last one 1990s approx.  . CYSTOSCOPY/URETEROSCOPY/HOLMIUM LASER/STENT PLACEMENT Left 04/16/2018   Procedure: CYSTOSCOPY/LEFT URETEROSCOPY/LEFT RETROGRADE/STENT PLACEMENT;  Surgeon: Lucas Mallow, MD;  [facility];  Service: Urology;  Laterality: Left;  . EUS N/A 05/03/2018   Procedure: UPPER ENDOSCOPIC ULTRASOUND (EUS) RADIAL;  Surgeon: Milus Banister, MD;  [facility];  Service: Gastroenterology;  Laterality: N/A;  . EUS N/A 05/03/2018   Procedure: UPPER ENDOSCOPIC ULTRASOUND (EUS) LINEAR;   Surgeon: Milus Banister, MD;  [facility];  Service: Gastroenterology;  Laterality: N/A;  . EXTRACORPOREAL SHOCK WAVE LITHOTRIPSY  x3  last one 2004 approx.  Marland Kitchen FINE NEEDLE ASPIRATION N/A 05/03/2018   Procedure: FINE NEEDLE ASPIRATION (FNA) LINEAR;  Surgeon: Milus Banister, MD;  [facility];  Service: Gastroenterology;  Laterality: N/A;  . LAPAROSCOPIC NEPHRECTOMY, HAND ASSISTED Right 05/16/2018   Procedure: HAND ASSISTED LAPAROSCOPIC RIGHT NEPHRECTOMY;  Surgeon: Lucas Mallow, MD;  [facility];  Service: Urology;  Laterality: Right;  . left index finger attachment  1980s   3-4 surgeries  . TOE SURGERY  1980s   in beween 2nd and 3rd toes cyst removed    Family Hx:  Family History  Problem Relation Age of Onset  . Alzheimer's disease Mother   . Alzheimer's disease Father   . Heart disease Father   . Heart attack Father   . Cancer - Other Sister     Social History:  reports that he has never smoked. He has never used smokeless tobacco. He reports that he does not drink alcohol or use drugs.  Allergies:  Allergies  Allergen Reactions  . Penicillins Rash and Other (See Comments)    Has patient had a PCN reaction causing immediate rash, facial/tongue/throat swelling, SOB or lightheadedness with hypotension: yes Has patient had a PCN reaction causing severe rash involving mucus membranes or skin necrosis: no Has patient had a PCN reaction that required hospitalization: no Has patient had a PCN reaction occurring within the last 10 years: no If all of the above answers are "NO", then may proceed with Cephalosporin use.     Medications: Prior to Admission medications   Medication Sig Start Date End Date Taking? Authorizing Provider  allopurinol (ZYLOPRIM) 300 MG tablet Take 300 mg by mouth every evening.  03/08/18  Yes [provider]  diphenhydramine-acetaminophen (TYLENOL PM) 25-500 MG TABS tablet Take 1 tablet by mouth at bedtime as needed  (sleep).   Yes [provider]  fluticasone (FLONASE) 50 MCG/ACT nasal spray Place 2 sprays into both nostrils daily as needed for allergies or rhinitis.   Yes [provider]  furosemide (LASIX) 40 MG tablet Take 1 tablet by mouth daily. 09/17/18  Yes [provider]  loratadine (CLARITIN) 10 MG tablet Take 10 mg by mouth every evening.    Yes [provider]  metoprolol tartrate (LOPRESSOR) 25 MG tablet Take 1 tablet (25 mg total) by mouth 2 (two) times daily. Patient taking differently: Take 25 mg by mouth as needed.  06/13/18  Yes Masoudi, Elhamalsadat, MD  omeprazole (PRILOSEC OTC) 20 MG tablet Take 20 mg by mouth daily.   Yes [provider]  Potassium Citrate 15 MEQ (1620 MG) TBCR Take 1 tablet by mouth every  evening.  03/22/18  Yes [provider]  senna-docusate (SENOKOT-S) 8.6-50 MG tablet Take 1 tablet by mouth daily. Patient taking differently: Take 1 tablet by mouth daily as needed for mild constipation.  05/21/18  Yes Fredricka Bonine, MD  tamsulosin (FLOMAX) 0.4 MG CAPS capsule Take 0.4 mg by mouth every evening.  03/08/18  Yes [provider]  famotidine (PEPCID) 40 MG tablet Take 40 mg by mouth daily.    [provider]    I have reviewed the patient's current medications.  Labs:  BMP Latest Ref Rng & Units 12/19/2018 12/19/2018 12/19/2018  Glucose 70 - 99 mg/dL 229(H) 423(H) 845(HH)  BUN 6 - 20 mg/dL 74(H) 77(H) 79(H)  Creatinine 0.61 - 1.24 mg/dL 4.47(H) 4.79(H) 4.93(H)  Sodium 135 - 145 mmol/L 132(L) 129(L) 118(LL)  Potassium 3.5 - 5.1 mmol/L 3.8 3.9 4.8  Chloride 98 - 111 mmol/L 102 96(L) 90(L)  CO2 22 - 32 mmol/L 17(L) 15(L) 15(L)  Calcium 8.9 - 10.3 mg/dL 8.6(L) 8.5(L) 7.9(L)    Urinalysis    Component Value Date/Time   COLORURINE STRAW (A) 12/19/2018 0305   APPEARANCEUR CLEAR 12/19/2018 0305   LABSPEC 1.014 12/19/2018 0305   PHURINE 6.0 12/19/2018 0305   GLUCOSEU >=500 (A) 12/19/2018 0305    HGBUR SMALL (A) 12/19/2018 0305   BILIRUBINUR NEGATIVE 12/19/2018 0305   KETONESUR NEGATIVE 12/19/2018 0305   PROTEINUR 30 (A) 12/19/2018 0305   NITRITE NEGATIVE 12/19/2018 0305   LEUKOCYTESUR SMALL (A) 12/19/2018 0305     ROS:  Pertinent items noted in HPI and remainder of comprehensive ROS otherwise negative.  Physical Exam: Vitals:   12/19/18 1200 12/19/18 1205  BP:  106/75  Pulse:  100  Resp:  13  Temp: 98.1 F (36.7 C)   SpO2:  99%     General: adult male in bed in no acute distress  HEENT: NCAT  Eyes:EOMI sclera anicteric Neck: supple trachea midline  Heart: tachycardic  Lungs:clear to auscultation bilaterally normal work of breathing Abdomen: softly distended/obese/nontender Extremities: trace edema bilateral lower extremities  Skin: no rash on extremities exposed Neuro: alert and oriented x 3; provides a history  Access: LUE AVF with bruit and thrill   Assessment/Plan:  # AKI  - Secondary to pre-renal insults with the DKA - Hopeful for improvement with fluids - Note that he may have had some degree of CKD progression as well.  Will continue to monitor closely for need for dialysis  # CKD stage IV  - Note most recent Cr 3.4 in 08/2018 - Has AVF which was felt slow to mature   # DKA - New diabetes diagnosis per patient report  - continue insulin/fluids per primary team   # HTN with CKD  - Acceptable   # Renal cell carcinoma - s/p nephrectomy   Claudia Desanctis 12/19/2018, 2:36 PM

## 2018-12-19 NOTE — H&P (Signed)
History and Physical    George Lewis. WGY:[id number] DOB: 08/25/1956 DOA: 12/19/2018  PCP: [facility], [facility]   Patient coming from: home   Chief Complaint: Generalized weakness, polyuria, polydipsia, abnormal outpatient blood work   HPI: George Lewis. is a 63 y.o. male with medical history significant for renal cell carcinoma status post right nephrectomy, chronic kidney disease stage IV with AV fistula that has not yet been used, and hypertension, now presenting to the emergency department for evaluation of generalized weakness, polyuria, polydipsia, and grossly abnormal outpatient blood work.  He describes an insidious onset of generalized weakness, lightheadedness, polyuria, and polydipsia over roughly 2 weeks.  Reports some blurred vision associated with this, but denies fevers, chills, headache, focal numbness or weakness, chest pain, or shortness of breath.  He denies any history of diabetes, and denies any new medications or steroid injection.  Patient reports having lesions on his pancreas and lung identified on prior imaging, is currently undergoing work-up for this and had outpatient blood work performed that was concerning for severe hyperglycemia and hyponatremia.  He was sent into the ED for evaluation.  ED Course: Upon arrival to the ED, patient is found to be afebrile, saturating well on room air, tachycardic to 120, and with stable blood pressure.  EKG features a sinus rhythm and chest x-ray is negative for acute cardiopulmonary disease.  Chemistry panel is notable for glucose of 929 with pseudohyponatremia, bicarbonate 15, BUN 80, creatinine 4.86, and slightly elevated anion gap.  CBC features a chronic leukocytosis and a mild normocytic anemia.  Patient was given a liter of normal saline, 10 units IV insulin, and was started on insulin infusion in the ED.  Review of Systems:  All other systems reviewed and apart from HPI, are negative.  Past Medical  History:  Diagnosis Date  . Arthritis    knees  . Bilateral renal cysts 03/23/2018   Noted MRI ABD  . Cholelithiasis 03/19/2018   noted on CT AB/Pelvis  . CKD (chronic kidney disease), stage IV [facility])    nephrologist-- dr Hollie Salk [name]);  05-01-2018 has not started dialysis, scheduled for AV fistula creation 05-07-2018  . Diverticulosis of colon 04/19/2018   Noted on CT abd/pelvis  . GERD (gastroesophageal reflux disease)   . Hepatic steatosis 03/19/2018   Mild diffuse, noted on CT AB/Pelvis  . History of kidney stones   . History of pulmonary embolus (PE) 03/2008   treated with coumadin for 6 months  . History of sepsis 04/20/2018   per discharge note , probable UTI  . Hypertension   . Nocturia   . OSA on CPAP   . Pancreatic lesion    0.4cm cystic per CT 07/ 2019  . Pneumonia   . Pulmonary nodule    Solid 42mm Right Lower Lobe  . Right renal mass 04/10/2018   new dx--  scheduled for nephrectomy 05-16-2018  . S/P ureteral stent placement: 04/16/2018 04/19/2018    Past Surgical History:  Procedure Laterality Date  . AV FISTULA PLACEMENT Left 05/07/2018   Procedure: Creation of Left arm Radiocephalic Fistula;  Surgeon: Marty Heck, MD;  Location: [place];  Service: Vascular;  Laterality: Left;  . CYSTOSCOPY/RETROGRADE/URETEROSCOPY/STONE EXTRACTION WITH BASKET  x2 last one 1990s approx.  . CYSTOSCOPY/URETEROSCOPY/HOLMIUM LASER/STENT PLACEMENT Left 04/16/2018   Procedure: CYSTOSCOPY/LEFT URETEROSCOPY/LEFT RETROGRADE/STENT PLACEMENT;  Surgeon: Lucas Mallow, MD;  [facility];  Service: Urology;  Laterality: Left;  . EUS N/A 05/03/2018  Procedure: UPPER ENDOSCOPIC ULTRASOUND (EUS) RADIAL;  Surgeon: Milus Banister, MD;  [facility];  Service: Gastroenterology;  Laterality: N/A;  . EUS N/A 05/03/2018   Procedure: UPPER ENDOSCOPIC ULTRASOUND (EUS) LINEAR;  Surgeon: Milus Banister, MD;  [facility];  Service: Gastroenterology;  Laterality:  N/A;  . EXTRACORPOREAL SHOCK WAVE LITHOTRIPSY  x3  last one 2004 approx.  Marland Kitchen FINE NEEDLE ASPIRATION N/A 05/03/2018   Procedure: FINE NEEDLE ASPIRATION (FNA) LINEAR;  Surgeon: Milus Banister, MD;  [facility];  Service: Gastroenterology;  Laterality: N/A;  . LAPAROSCOPIC NEPHRECTOMY, HAND ASSISTED Right 05/16/2018   Procedure: HAND ASSISTED LAPAROSCOPIC RIGHT NEPHRECTOMY;  Surgeon: Lucas Mallow, MD;  [facility];  Service: Urology;  Laterality: Right;  . left index finger attachment  1980s   3-4 surgeries  . TOE SURGERY  1980s   in beween 2nd and 3rd toes cyst removed     reports that he has never smoked. He has never used smokeless tobacco. He reports that he does not drink alcohol or use drugs.  Allergies  Allergen Reactions  . Penicillins Rash and Other (See Comments)    Has patient had a PCN reaction causing immediate rash, facial/tongue/throat swelling, SOB or lightheadedness with hypotension: yes Has patient had a PCN reaction causing severe rash involving mucus membranes or skin necrosis: no Has patient had a PCN reaction that required hospitalization: no Has patient had a PCN reaction occurring within the last 10 years: no If all of the above answers are "NO", then may proceed with Cephalosporin use.     Family History  Problem Relation Age of Onset  . Alzheimer's disease Mother   . Alzheimer's disease Father   . Heart disease Father   . Heart attack Father   . Cancer - Other Sister      Prior to Admission medications   Medication Sig Start Date End Date Taking? Authorizing Provider  allopurinol (ZYLOPRIM) 300 MG tablet Take 300 mg by mouth every evening.  03/08/18   [provider]  diphenhydramine-acetaminophen (TYLENOL PM) 25-500 MG TABS tablet Take 1 tablet by mouth at bedtime as needed (sleep).    [provider]  famotidine (PEPCID) 40 MG tablet Take 40 mg by mouth daily.    [provider]  fluticasone (FLONASE) 50  MCG/ACT nasal spray Place 2 sprays into both nostrils daily as needed for allergies or rhinitis.    [provider]  furosemide (LASIX) 40 MG tablet Take 1 tablet by mouth daily. 09/17/18   [provider]  loratadine (CLARITIN) 10 MG tablet Take 10 mg by mouth every evening.     [provider]  metoprolol tartrate (LOPRESSOR) 25 MG tablet Take 1 tablet (25 mg total) by mouth 2 (two) times daily. Patient taking differently: Take 25 mg by mouth as needed.  06/13/18   Masoudi, Dorthula Rue, MD  Potassium Citrate 15 MEQ (1620 MG) TBCR Take 1 tablet by mouth every evening.  03/22/18   [provider]  senna-docusate (SENOKOT-S) 8.6-50 MG tablet Take 1 tablet by mouth daily. Patient taking differently: Take 1 tablet by mouth daily as needed for mild constipation.  05/21/18   Fredricka Bonine, MD  tamsulosin [facility]) 0.4 MG CAPS capsule Take 0.4 mg by mouth every evening.  03/08/18   [provider]    Physical Exam: Vitals:   12/19/18 0006 12/19/18 0130 12/19/18 0202  BP: 117/65 (!) 109/59 117/72  Pulse: (!) 121 97 92  Resp:  16 14 13   Temp: 98.4 F (36.9 C)    TempSrc: Oral    SpO2: 96% 97% 98%    Constitutional: NAD, calm  Eyes: PERTLA, lids and conjunctivae normal ENMT: Mucous membranes are moist. Posterior pharynx clear of any exudate or lesions.   Neck: normal, supple, no masses, no thyromegaly Respiratory: clear to auscultation bilaterally, no wheezing, no crackles. No accessory muscle use.  Cardiovascular: S1 & S2 heard, regular rate and rhythm. No extremity edema.  Abdomen: No distension, no tenderness, soft. Bowel sounds active.  Musculoskeletal: no clubbing / cyanosis. No joint deformity upper and lower extremities.   Skin: no significant rashes, lesions, ulcers. Warm, dry, well-perfused. Neurologic: CN 2-12 grossly intact. Sensation intact. Strength 5/5 in all 4 limbs.  Psychiatric: Alert and oriented x 3. Very pleasant, cooperative.     Labs on Admission: I have personally reviewed following labs and imaging studies  CBC: Recent Labs  Lab 12/19/18 0048 12/19/18 0055  WBC 14.7*  --   NEUTROABS 10.8*  --   HGB 11.7* 12.2*  HCT 35.8* 36.0*  MCV 85.2  --   PLT 278  --    Basic Metabolic Panel: Recent Labs  Lab 12/19/18 0048 12/19/18 0055  NA 116* 117*  K 5.0 5.0  CL 85*  --   CO2 15*  --   GLUCOSE 929*  --   BUN 80*  --   CREATININE 4.86*  --   CALCIUM 8.3*  --   MG 1.7  --   PHOS 5.3*  --    GFR: CrCl cannot be calculated (Unknown ideal weight.). Liver Function Tests: Recent Labs  Lab 12/19/18 0048  AST 16  ALT 7  ALKPHOS 128*  BILITOT 0.5  PROT 7.5  ALBUMIN 3.0*   No results for input(s): LIPASE, AMYLASE in the last 168 hours. No results for input(s): AMMONIA in the last 168 hours. Coagulation Profile: No results for input(s): INR, PROTIME in the last 168 hours. Cardiac Enzymes: No results for input(s): CKTOTAL, CKMB, CKMBINDEX, TROPONINI in the last 168 hours. BNP (last 3 results) No results for input(s): PROBNP in the last 8760 hours. HbA1C: No results for input(s): HGBA1C in the last 72 hours. CBG: Recent Labs  Lab 12/19/18 0039 12/19/18 0202  GLUCAP >600* >600*   Lipid Profile: No results for input(s): CHOL, HDL, LDLCALC, TRIG, CHOLHDL, LDLDIRECT in the last 72 hours. Thyroid Function Tests: No results for input(s): TSH, T4TOTAL, FREET4, T3FREE, THYROIDAB in the last 72 hours. Anemia Panel: No results for input(s): VITAMINB12, FOLATE, FERRITIN, TIBC, IRON, RETICCTPCT in the last 72 hours. Urine analysis:    Component Value Date/Time   COLORURINE YELLOW 06/12/2018 1620   APPEARANCEUR CLEAR 06/12/2018 1620   LABSPEC 1.010 06/12/2018 1620   PHURINE 5.0 06/12/2018 1620   GLUCOSEU NEGATIVE 06/12/2018 1620   HGBUR SMALL (A) 06/12/2018 1620   BILIRUBINUR NEGATIVE 06/12/2018 1620   KETONESUR NEGATIVE 06/12/2018 1620   PROTEINUR 100 (A) 06/12/2018 1620   NITRITE NEGATIVE  06/12/2018 1620   LEUKOCYTESUR NEGATIVE 06/12/2018 1620   Sepsis Labs: @LABRCNTIP (procalcitonin:4,lacticidven:4) )No results found for this or any previous visit (from the past 240 hour(s)).   Radiological Exams on Admission: Dg Chest Port 1 View  Result Date: 12/19/2018 CLINICAL DATA:  Weakness EXAM: PORTABLE CHEST 1 VIEW COMPARISON:  06/12/2018 FINDINGS: Heart and mediastinal contours are within normal limits. No focal opacities or effusions. No acute bony abnormality. IMPRESSION: No active disease. Electronically Signed   By: Rolm Baptise M.D.  On: 12/19/2018 01:09    EKG: Independently reviewed. Sinus rhythm.   Assessment/Plan   1. DKA  - Presents for evaluation of hyperglycemia on outpatient blood work and reports ~2 wks of progressive malaise with polyuria and polydipsia  - Found to have serum glucose of 929 with pseudohyponatremia, bicarbonate 15, and slightly elevated AG  - Likely this is presentation of new-onset DM as no serious underlying illness or medication identified to explain  - Continue insulin infusion with frequent CBG and serial chem panels, continue IVF hydration, consult with diabetes coordinator    2. Acute kidney injury superimposed on CKD IV  - SCr is 4.86 on admission, up from 3.80 in October  - Likely prerenal azotemia in setting of marked hyperglycemia and osmotic diuresis  - He has AVF in LUE that has not been used but is ready per recent vascular surgery office notes  - Check urine chemistries, continue IVF hydration, renally-dose medications, repeat chem panel    3. Hypertension  - SBP low 100's in ED and antihypertensives held initially    4. Renal cell carcinoma  - Status post right nephrectomy, continuing urology follow-up     PPE: Mask, face shield  DVT prophylaxis: sq heparin  Code Status: Full  Family Communication: Discussed with patient  Consults called: None Admission status: Observation     Vianne Bulls, MD [facility]  Pager [phone]  If 7PM-7AM, please contact night-coverage [email or web address] Password [email or web address]  12/19/2018, 2:12 AM

## 2018-12-19 NOTE — ED Notes (Signed)
CBG HI. RN Mylan notified.

## 2018-12-19 NOTE — ED Provider Notes (Signed)
[place] EMERGENCY DEPARTMENT Provider Note   CSN: [id number] Arrival date & time: 12/19/18  0000    History   Chief Complaint Chief Complaint  Patient presents with  . Abnormal Lab    HPI George Lewis. is a 63 y.o. male.   The history is provided by the patient.  He has history of pulmonary embolism, hypertension, renal cancer status post right nephrectomy and was told to come in because of high glucose.  He had blood drawn for a follow-up CT scan, and he got a call from his urologist tonight telling him to come to the ED.  He does admit to polyuria and polydipsia and generalized weakness for the last several days.  He has noted exertional dyspnea.  He denies fever, chills, sweats.  He denies nausea or vomiting or diarrhea.  He denies any chest pain, heaviness, tightness, pressure.  He denies family history of diabetes.  Past Medical History:  Diagnosis Date  . Arthritis    knees  . Bilateral renal cysts 03/23/2018   Noted MRI ABD  . Cholelithiasis 03/19/2018   noted on CT AB/Pelvis  . CKD (chronic kidney disease), stage IV [facility])    nephrologist-- dr Hollie Salk [name]);  05-01-2018 has not started dialysis, scheduled for AV fistula creation 05-07-2018  . Diverticulosis of colon 04/19/2018   Noted on CT abd/pelvis  . GERD (gastroesophageal reflux disease)   . Hepatic steatosis 03/19/2018   Mild diffuse, noted on CT AB/Pelvis  . History of kidney stones   . History of pulmonary embolus (PE) 03/2008   treated with coumadin for 6 months  . History of sepsis 04/20/2018   per discharge note , probable UTI  . Hypertension   . Nocturia   . OSA on CPAP   . Pancreatic lesion    0.4cm cystic per CT 07/ 2019  . Pneumonia   . Pulmonary nodule    Solid 28mm Right Lower Lobe  . Right renal mass 04/10/2018   new dx--  scheduled for nephrectomy 05-16-2018  . S/P ureteral stent placement: 04/16/2018 04/19/2018    Patient Active Problem List   Diagnosis Date Noted  . Syncope 06/12/2018  . Ileus ([place])   . SVT (supraventricular tachycardia) ([place])   . Renal mass, right 05/16/2018  . Mass of pancreas   . Abnormal CT of the abdomen   . Bacteremia due to Enterococcus 04/23/2018  . Sepsis ([place]) 04/19/2018  . CKD (chronic kidney disease) 04/19/2018  . Dehydration 04/19/2018  . UTI (urinary tract infection): Probable 04/19/2018  . Leukocytosis 04/19/2018  . Anemia 04/19/2018  . Renal cell carcinoma, right ([place]) 04/19/2018  . S/P ureteral stent placement: 04/16/2018 04/19/2018  . GERD (gastroesophageal reflux disease) 04/19/2018  . OSA on CPAP 04/19/2018  . HTN (hypertension) 04/19/2018    Past Surgical History:  Procedure Laterality Date  . AV FISTULA PLACEMENT Left 05/07/2018   Procedure: Creation of Left arm Radiocephalic Fistula;  Surgeon: Marty Heck, MD;  Location: [place];  Service: Vascular;  Laterality: Left;  . CYSTOSCOPY/RETROGRADE/URETEROSCOPY/STONE EXTRACTION WITH BASKET  x2 last one 1990s approx.  . CYSTOSCOPY/URETEROSCOPY/HOLMIUM LASER/STENT PLACEMENT Left 04/16/2018   Procedure: CYSTOSCOPY/LEFT URETEROSCOPY/LEFT RETROGRADE/STENT PLACEMENT;  Surgeon: Lucas Mallow, MD;  [facility];  Service: Urology;  Laterality: Left;  . EUS N/A 05/03/2018   Procedure: UPPER ENDOSCOPIC ULTRASOUND (EUS) RADIAL;  Surgeon: Milus Banister, MD;  [facility];  Service: Gastroenterology;  Laterality: N/A;  . EUS N/A 05/03/2018  Procedure: UPPER ENDOSCOPIC ULTRASOUND (EUS) LINEAR;  Surgeon: Milus Banister, MD;  [facility];  Service: Gastroenterology;  Laterality: N/A;  . EXTRACORPOREAL SHOCK WAVE LITHOTRIPSY  x3  last one 2004 approx.  Marland Kitchen FINE NEEDLE ASPIRATION N/A 05/03/2018   Procedure: FINE NEEDLE ASPIRATION (FNA) LINEAR;  Surgeon: Milus Banister, MD;  [facility];  Service: Gastroenterology;  Laterality: N/A;  . LAPAROSCOPIC NEPHRECTOMY, HAND ASSISTED Right 05/16/2018   Procedure: HAND  ASSISTED LAPAROSCOPIC RIGHT NEPHRECTOMY;  Surgeon: Lucas Mallow, MD;  [facility];  Service: Urology;  Laterality: Right;  . left index finger attachment  1980s   3-4 surgeries  . TOE SURGERY  1980s   in beween 2nd and 3rd toes cyst removed        Home Medications    Prior to Admission medications   Medication Sig Start Date End Date Taking? Authorizing Provider  allopurinol (ZYLOPRIM) 300 MG tablet Take 300 mg by mouth every evening.  03/08/18   [provider]  diphenhydramine-acetaminophen (TYLENOL PM) 25-500 MG TABS tablet Take 1 tablet by mouth at bedtime as needed (sleep).    [provider]  famotidine (PEPCID) 40 MG tablet Take 40 mg by mouth daily.    [provider]  fluticasone (FLONASE) 50 MCG/ACT nasal spray Place 2 sprays into both nostrils daily as needed for allergies or rhinitis.    [provider]  furosemide (LASIX) 40 MG tablet Take 1 tablet by mouth daily. 09/17/18   [provider]  loratadine (CLARITIN) 10 MG tablet Take 10 mg by mouth every evening.     [provider]  metoprolol tartrate (LOPRESSOR) 25 MG tablet Take 1 tablet (25 mg total) by mouth 2 (two) times daily. Patient taking differently: Take 25 mg by mouth as needed.  06/13/18   Masoudi, Dorthula Rue, MD  Potassium Citrate 15 MEQ (1620 MG) TBCR Take 1 tablet by mouth every evening.  03/22/18   [provider]  senna-docusate (SENOKOT-S) 8.6-50 MG tablet Take 1 tablet by mouth daily. Patient taking differently: Take 1 tablet by mouth daily as needed for mild constipation.  05/21/18   Fredricka Bonine, MD  tamsulosin (FLOMAX) 0.4 MG CAPS capsule Take 0.4 mg by mouth every evening.  03/08/18   [provider]    Family History Family History  Problem Relation Age of Onset  . Alzheimer's disease Mother   . Alzheimer's disease Father   . Heart disease Father   . Heart attack Father   . Cancer - Other Sister     Social  History Social History   Tobacco Use  . Smoking status: Never Smoker  . Smokeless tobacco: Never Used  Substance Use Topics  . Alcohol use: Never    Frequency: Never  . Drug use: Never     Allergies   Penicillins   Review of Systems Review of Systems  All other systems reviewed and are negative.    Physical Exam Updated Vital Signs BP 117/65 (BP Location: Right Arm)   Pulse (!) 121   Temp 98.4 F (36.9 C) (Oral)   Resp 16   SpO2 96%   Physical Exam Vitals signs and nursing note reviewed.    Obese 63 year old male, appears dyspneic at rest, but is in no acute distress. Vital signs are significant for rapid heart rate. Oxygen saturation is 96%, which is normal. Head is normocephalic and atraumatic. PERRLA, EOMI. Oropharynx is clear. Neck is nontender and supple without adenopathy  or JVD. Back is nontender and there is no CVA tenderness. Lungs are clear without rales, wheezes, or rhonchi. Chest is nontender. Heart has regular rate and rhythm without murmur. Abdomen is soft, flat, nontender without masses or hepatosplenomegaly and peristalsis is normoactive. Extremities have 1+ edema, full range of motion is present. Skin is warm and dry without rash. Neurologic: Mental status is normal, cranial nerves are intact, there are no motor or sensory deficits.  ED Treatments / Results  Labs (all labs ordered are listed, but only abnormal results are displayed) Labs Reviewed  COMPREHENSIVE METABOLIC PANEL - Abnormal; Notable for the following components:      Result Value   Sodium 116 (*)    Chloride 85 (*)    CO2 15 (*)    Glucose, Bld 929 (*)    BUN 80 (*)    Creatinine, Ser 4.86 (*)    Calcium 8.3 (*)    Albumin 3.0 (*)    Alkaline Phosphatase 128 (*)    GFR calc non Af Amer 12 (*)    GFR calc Af Amer 14 (*)    Anion gap 16 (*)    All other components within normal limits  CBC WITH DIFFERENTIAL/PLATELET - Abnormal; Notable for the following components:   WBC  14.7 (*)    RBC 4.20 (*)    Hemoglobin 11.7 (*)    HCT 35.8 (*)    Neutro Abs 10.8 (*)    Abs Immature Granulocytes 0.23 (*)    All other components within normal limits  PHOSPHORUS - Abnormal; Notable for the following components:   Phosphorus 5.3 (*)    All other components within normal limits  CBG MONITORING, ED - Abnormal; Notable for the following components:   Glucose-Capillary >600 (*)    All other components within normal limits  POCT I-STAT EG7 - Abnormal; Notable for the following components:   pCO2, Ven 35.9 (*)    Bicarbonate 17.1 (*)    TCO2 18 (*)    Acid-base deficit 9.0 (*)    Sodium 117 (*)    Calcium, Ion 1.11 (*)    HCT 36.0 (*)    Hemoglobin 12.2 (*)    All other components within normal limits  CBG MONITORING, ED - Abnormal; Notable for the following components:   Glucose-Capillary >600 (*)    All other components within normal limits  MAGNESIUM  URINALYSIS, ROUTINE W REFLEX MICROSCOPIC  I-STAT VENOUS BLOOD GAS, ED    EKG EKG Interpretation  Date/Time:  Wednesday December 19 2018 01:08:00 EDT Ventricular Rate:  97 PR Interval:    QRS Duration: 95 QT Interval:  346 QTC Calculation: 440 R Axis:   27 Text Interpretation:  Sinus rhythm Low voltage, precordial leads When compared with ECG of 06/12/2018, No significant change was found Confirmed by Delora Fuel ([id number]) on 12/19/2018 1:12:38 AM   Radiology Dg Chest Port 1 View  Result Date: 12/19/2018 CLINICAL DATA:  Weakness EXAM: PORTABLE CHEST 1 VIEW COMPARISON:  06/12/2018 FINDINGS: Heart and mediastinal contours are within normal limits. No focal opacities or effusions. No acute bony abnormality. IMPRESSION: No active disease. Electronically Signed   By: Rolm Baptise M.D.   On: 12/19/2018 01:09    Procedures Procedures  CRITICAL CARE Performed by: Delora Fuel Total critical care time: 60 minutes Critical care time was exclusive of separately billable procedures and treating other patients.  Critical care was necessary to treat or prevent imminent or life-threatening deterioration. Critical care was time spent personally by  me on the following activities: development of treatment plan with patient and/or surrogate as well as nursing, discussions with consultants, evaluation of patient's response to treatment, examination of patient, obtaining history from patient or surrogate, ordering and performing treatments and interventions, ordering and review of laboratory studies, ordering and review of radiographic studies, pulse oximetry and re-evaluation of patient's condition.  Medications Ordered in ED Medications  dextrose 5 %-0.45 % sodium chloride infusion (has no administration in time range)  insulin regular bolus via infusion 0-10 Units (has no administration in time range)  insulin regular, human (MYXREDLIN) 100 units/ 100 mL infusion (5.4 Units/hr Intravenous New Bag/Given 12/19/18 0207)  dextrose 50 % solution 25 mL (has no administration in time range)  0.9 %  sodium chloride infusion ( Intravenous New Bag/Given 12/19/18 0207)  sodium chloride 0.9 % bolus 1,000 mL (1,000 mLs Intravenous New Bag/Given 12/19/18 0100)  insulin aspart (novoLOG) injection 10 Units (10 Units Intravenous Given 12/19/18 0105)     Initial Impression / Assessment and Plan / ED Course  I have reviewed the triage vital signs and the nursing notes.  Pertinent labs & imaging results that were available during my care of the patient were reviewed by me and considered in my medical decision making (see chart for details).  Hyperglycemia.  Doubt DKA, but will need to check labs.  Old records are reviewed, and last glucose on record was 118 last October, but it is noted that he had glucose of 209 on May 17, 2018.  Fingerstick blood glucose here is greater than 600.  He is given IV fluids and insulin while waiting for labs to come back.  Labs show normal venous pH.  Metabolic panel showed glucose 929 with  sodium 116 and CO2 of 15.  His most recent CO2 was 18.  There is evidence of acute kidney injury with baseline creatinine 3.8 and creatinine today 4.86.  I suspect that worsening renal failure accounts for his acidosis.  Anion gap is normal.  Part of his upper natremia is hyperglycemia, but sodium is low even accounting for his hyperglycemia.  Chronic anemia is unchanged from baseline.  He is started on glucose stabilizer.  ECG is unchanged from prior, and chest x-ray is unremarkable.  Case is discussed with Dr. Myna Hidalgo of [facility], who agrees to admit the patient.  Final Clinical Impressions(s) / ED Diagnoses   Final diagnoses:  Hyperglycemia  Acute renal failure superimposed on stage 4 chronic kidney disease, unspecified acute renal failure type ([place])  Metabolic acidosis  Hyponatremia  Normochromic normocytic anemia    ED Discharge Orders    None       Delora Fuel, MD 16/10/96 0211

## 2018-12-19 NOTE — Progress Notes (Signed)
Inpatient Diabetes Program Recommendations  AACE/ADA: New Consensus Statement on Inpatient Glycemic Control (2015)  Target Ranges:  Prepandial:   less than 140 mg/dL      Peak postprandial:   less than 180 mg/dL (1-2 hours)      Critically ill patients:  140 - 180 mg/dL   Lab Results  Component Value Date   GLUCAP 179 (H) 12/19/2018   HGBA1C 15.0 (H) 12/19/2018    Review of Glycemic Control Results for [place]. "George Lewis (MRN [id number]) as of 12/19/2018 13:12  Ref. Range 12/19/2018 11:23 12/19/2018 12:01 12/19/2018 13:01  Glucose-Capillary Latest Ref Range: 70 - 99 mg/dL 189 (H) 170 (H) 182 (H)   Diabetes history: New onset DM Outpatient Diabetes medications: none Current orders for Inpatient glycemic control: IV insulin  Inpatient Diabetes Program Recommendations:    When ready to transition off IV insulin (CO2 > 20, Anion gap <10, drip rate <3 units/hr) consider:  -Lantus 40 units 2 hours prior to discontinuation of insulin drip, then Q24H following. -Novolog 0-15 units TID & HS - Novolog 5 units TID (assuming that patient is consuming >50% of meal).  - Also, noted previous history of pancreatic mass from 04/2018. Contacted MD. Plan to order CT of abdomen for AM.  Spoke with patient and wife regarding new diagnosis of DM.    Reviewed patient's current A1c of 15%. Explained what a A1c is and what it measures. Also reviewed goal A1c with patient, importance of good glucose control @ home, and blood sugar goals. Reviewed patho of DM, need for insulin, role of pancreas, survival skills, importance of checking blood sugars, vascular changes and comorditities.  Patient will need glucose meter at discharge. Blood glucose meter kit (includes lancets and strips) ([id number]). Encouraged to begin checking 2- 3 times per day and working with RNs while inpatient to begin learning. Discussed when to check at home as well as when to contact MD. Patient has questions regarding diet, will  consult dietitian.  Stressed the importance of having PCP to follow up. Encouraged wife to look at insurance for preferred providers; informed that patient may need to transfer to wife's insurance as his will run out within the month (per the patient). Wife plans to make outpatient appointment. Discussed when to call MD. Wife able to verbalize interventions for survival skills.  Ordered LWWDM book and insulin starter kit. Encouraged patient to begin working on checking blood sugars and injections once they are ordered. Email sent to wife that includes learning videos and insulin administration using insulin pens. Encouraged to review materials and write down questions. Patient and wife have no questions at this time.   Thanks, Bronson Curb, MSN, RNC-OB Diabetes Coordinator [phone] (8a-5p)

## 2018-12-19 NOTE — Progress Notes (Addendum)
CRITICAL VALUE ALERT  Critical Value:  Sodium 118 and Glucose 845  Date & Time Notied: 12/19/18 0345  Provider Notified: Mitzi Hansen, MD  Orders Received/Actions taken: 500cc bolus of normal saline

## 2018-12-19 NOTE — Progress Notes (Signed)
PROGRESS NOTE    George Lewis.  AOZ:[id number] DOB: 1955-10-22 DOA: 12/19/2018 PCP: Pc, [facility]      Brief Narrative:  George Lewis is a 63 y.o. M with obesity, RCC s/p R nephrectomy, CKD IV with Fistula, follows with Dr. Hollie Salk, does not have PCP, and HTN untreated who presents with 2 weeks weakness, malaise, polyuria and now found to have abnormal labs and referred to the ER.  Yesterday, he had routine labs done by his Urologist, who called him after hours and recommended he go to the ER.  In the ER, Glucose >900, tachycardic, bicarb 15, AG >16, creatinine up to 4.8 from baseline 3.5.  He was started on IV fluids and insulin drip.      Assessment & Plan:  Diabetic ketoacidosis Suspected longstanding type 2 diabetes For the last 5 years, the patient has been using urgent care as his PCP.  He thinks that he "may have" been told in the past he has diabetes, but has never been on treatment. -Follow-up hemoglobin A1c -Continue every 4 BMP -Continue insulin infusion at least for the next 24 hours to gauge insulin needs   Metabolic acidosis Anion gap elevated.  There appears to be a delta-delta <1 suggesting concurrnet nongap acidosis.  Furthermore, his glucose has normalized but bicarb and AG have not changed.  Unclear to me the degree to which this is diabetic ketoacidosis vs renal failure. -Consult nephrology, appreciate cares -Check BHOB and lactate  Acute on chronic renal failure -Consult nephrology  Hypertension BP soft -Hold home Lasix and metoprolol  Renal cell cancer   BPH -Continue Flomax     DVT prophylaxis: heparin Code Status: FULL Family Communication: wife by phone MDM and disposition Plan: This is a no charge note.  For further details, please see H&P by my partner Dr. Myna Hidalgo from earlier today.  The below labs and imaging reports were reviewed and summarized above.    The patient was admitted with DKA and worsening renal failure.     Objective: Vitals:   12/19/18 0319 12/19/18 0538 12/19/18 0540 12/19/18 0743  BP:   126/64   Pulse:   94   Resp:   13   Temp: 98.1 F (36.7 C) 97.8 F (36.6 C)  97.9 F (36.6 C)  TempSrc: Oral Oral  Oral  SpO2:   98%     Intake/Output Summary (Last 24 hours) at 12/19/2018 1156 Last data filed at 12/19/2018 0545 Gross per 24 hour  Intake 730 ml  Output -  Net 730 ml   There were no vitals filed for this visit.  Examination: The patient was seen and examined.      Data Reviewed: I have personally reviewed following labs and imaging studies:  CBC: Recent Labs  Lab 12/19/18 0048 12/19/18 0055  WBC 14.7*  --   NEUTROABS 10.8*  --   HGB 11.7* 12.2*  HCT 35.8* 36.0*  MCV 85.2  --   PLT 278  --    Basic Metabolic Panel: Recent Labs  Lab 12/19/18 0048 12/19/18 0055 12/19/18 0238 12/19/18 0606 12/19/18 1012  NA 116* 117* 118* 129* 132*  K 5.0 5.0 4.8 3.9 3.8  CL 85*  --  90* 96* 102  CO2 15*  --  15* 15* 17*  GLUCOSE 929*  --  845* 423* 229*  BUN 80*  --  79* 77* 74*  CREATININE 4.86*  --  4.93* 4.79* 4.47*  CALCIUM 8.3*  --  7.9*  8.5* 8.6*  MG 1.7  --   --   --   --   PHOS 5.3*  --   --   --   --    GFR: CrCl cannot be calculated (Unknown ideal weight.). Liver Function Tests: Recent Labs  Lab 12/19/18 0048  AST 16  ALT 7  ALKPHOS 128*  BILITOT 0.5  PROT 7.5  ALBUMIN 3.0*   No results for input(s): LIPASE, AMYLASE in the last 168 hours. No results for input(s): AMMONIA in the last 168 hours. Coagulation Profile: No results for input(s): INR, PROTIME in the last 168 hours. Cardiac Enzymes: No results for input(s): CKTOTAL, CKMB, CKMBINDEX, TROPONINI in the last 168 hours. BNP (last 3 results) No results for input(s): PROBNP in the last 8760 hours. HbA1C: No results for input(s): HGBA1C in the last 72 hours. CBG: Recent Labs  Lab 12/19/18 0737 12/19/18 0801 12/19/18 0918 12/19/18 1032 12/19/18 1123  GLUCAP 216* 197* 194* 205* 189*    Lipid Profile: No results for input(s): CHOL, HDL, LDLCALC, TRIG, CHOLHDL, LDLDIRECT in the last 72 hours. Thyroid Function Tests: No results for input(s): TSH, T4TOTAL, FREET4, T3FREE, THYROIDAB in the last 72 hours. Anemia Panel: No results for input(s): VITAMINB12, FOLATE, FERRITIN, TIBC, IRON, RETICCTPCT in the last 72 hours. Urine analysis:    Component Value Date/Time   COLORURINE STRAW (A) 12/19/2018 0305   APPEARANCEUR CLEAR 12/19/2018 0305   LABSPEC 1.014 12/19/2018 0305   PHURINE 6.0 12/19/2018 0305   GLUCOSEU >=500 (A) 12/19/2018 0305   HGBUR SMALL (A) 12/19/2018 0305   BILIRUBINUR NEGATIVE 12/19/2018 0305   KETONESUR NEGATIVE 12/19/2018 0305   PROTEINUR 30 (A) 12/19/2018 0305   NITRITE NEGATIVE 12/19/2018 0305   LEUKOCYTESUR SMALL (A) 12/19/2018 0305   Sepsis Labs: @LABRCNTIP (procalcitonin:4,lacticacidven:4)  )No results found for this or any previous visit (from the past 240 hour(s)).       Radiology Studies: Dg Chest Port 1 View  Result Date: 12/19/2018 CLINICAL DATA:  Weakness EXAM: PORTABLE CHEST 1 VIEW COMPARISON:  06/12/2018 FINDINGS: Heart and mediastinal contours are within normal limits. No focal opacities or effusions. No acute bony abnormality. IMPRESSION: No active disease. Electronically Signed   By: Rolm Baptise M.D.   On: 12/19/2018 01:09        Scheduled Meds: . heparin  5,000 Units Subcutaneous Q8H   Continuous Infusions: . sodium chloride Stopped (12/19/18 0741)  . dextrose 5 % and 0.45% NaCl 75 mL/hr at 12/19/18 0740  . insulin 9 Units/hr (12/19/18 1135)     LOS: 0 days    Time spent: 15 minutes    Edwin Dada, MD [facility] 12/19/2018, 11:56 AM     Pager [phone] --- please page though AMION:  [email or web address] Password [email or web address] If 7PM-7AM, please contact night-coverage

## 2018-12-19 NOTE — Progress Notes (Signed)
Patient transferred from ED to [phone]. Patient A&Ox4. Progressive care monitor [id number] applied and second verified by myself and Genelle Gather., RN. Skin assessment completed by this RN and Genelle Gather., RN. Patient instructed how to use the callbell before getting out of bed. Callbell within reach. Admission handbook given to patient. Will continue to monitor and treat per MD orders.

## 2018-12-19 NOTE — ED Notes (Addendum)
Critical lab: sodium 116 glucose 929 provider aware glucostabuilizer initiated.

## 2018-12-20 ENCOUNTER — Observation Stay (HOSPITAL_COMMUNITY): Payer: PRIVATE HEALTH INSURANCE

## 2018-12-20 DIAGNOSIS — G4733 Obstructive sleep apnea (adult) (pediatric): Secondary | ICD-10-CM | POA: Diagnosis present

## 2018-12-20 DIAGNOSIS — Z6839 Body mass index (BMI) 39.0-39.9, adult: Secondary | ICD-10-CM | POA: Diagnosis not present

## 2018-12-20 DIAGNOSIS — Z7951 Long term (current) use of inhaled steroids: Secondary | ICD-10-CM | POA: Diagnosis not present

## 2018-12-20 DIAGNOSIS — Z85528 Personal history of other malignant neoplasm of kidney: Secondary | ICD-10-CM | POA: Diagnosis not present

## 2018-12-20 DIAGNOSIS — E119 Type 2 diabetes mellitus without complications: Secondary | ICD-10-CM | POA: Diagnosis not present

## 2018-12-20 DIAGNOSIS — I10 Essential (primary) hypertension: Secondary | ICD-10-CM | POA: Diagnosis not present

## 2018-12-20 DIAGNOSIS — D72828 Other elevated white blood cell count: Secondary | ICD-10-CM | POA: Diagnosis present

## 2018-12-20 DIAGNOSIS — R911 Solitary pulmonary nodule: Secondary | ICD-10-CM | POA: Diagnosis present

## 2018-12-20 DIAGNOSIS — I472 Ventricular tachycardia: Secondary | ICD-10-CM | POA: Diagnosis present

## 2018-12-20 DIAGNOSIS — E860 Dehydration: Secondary | ICD-10-CM | POA: Diagnosis present

## 2018-12-20 DIAGNOSIS — I129 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 kidney disease, or unspecified chronic kidney disease: Secondary | ICD-10-CM | POA: Diagnosis present

## 2018-12-20 DIAGNOSIS — E1122 Type 2 diabetes mellitus with diabetic chronic kidney disease: Secondary | ICD-10-CM | POA: Diagnosis present

## 2018-12-20 DIAGNOSIS — N400 Enlarged prostate without lower urinary tract symptoms: Secondary | ICD-10-CM | POA: Diagnosis present

## 2018-12-20 DIAGNOSIS — E1110 Type 2 diabetes mellitus with ketoacidosis without coma: Secondary | ICD-10-CM | POA: Diagnosis present

## 2018-12-20 DIAGNOSIS — N184 Chronic kidney disease, stage 4 (severe): Secondary | ICD-10-CM | POA: Diagnosis present

## 2018-12-20 DIAGNOSIS — K869 Disease of pancreas, unspecified: Secondary | ICD-10-CM | POA: Diagnosis present

## 2018-12-20 DIAGNOSIS — R7989 Other specified abnormal findings of blood chemistry: Secondary | ICD-10-CM | POA: Diagnosis present

## 2018-12-20 DIAGNOSIS — Z86711 Personal history of pulmonary embolism: Secondary | ICD-10-CM | POA: Diagnosis not present

## 2018-12-20 DIAGNOSIS — Z87442 Personal history of urinary calculi: Secondary | ICD-10-CM | POA: Diagnosis not present

## 2018-12-20 DIAGNOSIS — Z905 Acquired absence of kidney: Secondary | ICD-10-CM | POA: Diagnosis not present

## 2018-12-20 DIAGNOSIS — Z79899 Other long term (current) drug therapy: Secondary | ICD-10-CM | POA: Diagnosis not present

## 2018-12-20 DIAGNOSIS — K760 Fatty (change of) liver, not elsewhere classified: Secondary | ICD-10-CM | POA: Diagnosis present

## 2018-12-20 DIAGNOSIS — N179 Acute kidney failure, unspecified: Secondary | ICD-10-CM | POA: Diagnosis present

## 2018-12-20 DIAGNOSIS — E669 Obesity, unspecified: Secondary | ICD-10-CM | POA: Diagnosis present

## 2018-12-20 DIAGNOSIS — D631 Anemia in chronic kidney disease: Secondary | ICD-10-CM | POA: Diagnosis present

## 2018-12-20 DIAGNOSIS — Z880 Allergy status to penicillin: Secondary | ICD-10-CM | POA: Diagnosis not present

## 2018-12-20 LAB — BASIC METABOLIC PANEL
Anion gap: 11 (ref 5–15)
Anion gap: 14 (ref 5–15)
BUN: 73 mg/dL — ABNORMAL HIGH (ref 6–20)
BUN: 71 mg/dL — ABNORMAL HIGH (ref 6–20)
CO2: 16 mmol/L — ABNORMAL LOW (ref 22–32)
CO2: 16 mmol/L — ABNORMAL LOW (ref 22–32)
Calcium: 8.4 mg/dL — ABNORMAL LOW (ref 8.9–10.3)
Calcium: 8.4 mg/dL — ABNORMAL LOW (ref 8.9–10.3)
Chloride: 107 mmol/L (ref 98–111)
Chloride: 104 mmol/L (ref 98–111)
Creatinine, Ser: 4.29 mg/dL — ABNORMAL HIGH (ref 0.61–1.24)
Creatinine, Ser: 4.36 mg/dL — ABNORMAL HIGH (ref 0.61–1.24)
GFR calc Af Amer: 16 mL/min — ABNORMAL LOW (ref >60)
GFR calc Af Amer: 16 mL/min — ABNORMAL LOW (ref >60)
GFR calc non Af Amer: 14 mL/min — ABNORMAL LOW (ref >60)
GFR calc non Af Amer: 14 mL/min — ABNORMAL LOW (ref >60)
Glucose, Bld: 142 mg/dL — ABNORMAL HIGH (ref 70–99)
Glucose, Bld: 180 mg/dL — ABNORMAL HIGH (ref 70–99)
Potassium: 3.5 mmol/L (ref 3.5–5.1)
Potassium: 3.7 mmol/L (ref 3.5–5.1)
Sodium: 134 mmol/L — ABNORMAL LOW (ref 135–145)
Sodium: 134 mmol/L — ABNORMAL LOW (ref 135–145)

## 2018-12-20 LAB — CBC
HCT: 33.1 % — ABNORMAL LOW (ref 39.0–52.0)
Hemoglobin: 11.3 g/dL — ABNORMAL LOW (ref 13.0–17.0)
MCH: 28 pg (ref 26.0–34.0)
MCHC: 34.1 g/dL (ref 30.0–36.0)
MCV: 82.1 fL (ref 80.0–100.0)
Platelets: 267 10*3/uL (ref 150–400)
RBC: 4.03 MIL/uL — ABNORMAL LOW (ref 4.22–5.81)
RDW: 14.7 % (ref 11.5–15.5)
WBC: 11.9 10*3/uL — ABNORMAL HIGH (ref 4.0–10.5)
nRBC: 0 % (ref 0.0–0.2)

## 2018-12-20 LAB — GLUCOSE, CAPILLARY
Glucose-Capillary: 126 mg/dL — ABNORMAL HIGH (ref 70–99)
Glucose-Capillary: 130 mg/dL — ABNORMAL HIGH (ref 70–99)
Glucose-Capillary: 133 mg/dL — ABNORMAL HIGH (ref 70–99)
Glucose-Capillary: 154 mg/dL — ABNORMAL HIGH (ref 70–99)
Glucose-Capillary: 163 mg/dL — ABNORMAL HIGH (ref 70–99)
Glucose-Capillary: 172 mg/dL — ABNORMAL HIGH (ref 70–99)
Glucose-Capillary: 173 mg/dL — ABNORMAL HIGH (ref 70–99)
Glucose-Capillary: 178 mg/dL — ABNORMAL HIGH (ref 70–99)
Glucose-Capillary: 181 mg/dL — ABNORMAL HIGH (ref 70–99)
Glucose-Capillary: 185 mg/dL — ABNORMAL HIGH (ref 70–99)
Glucose-Capillary: 197 mg/dL — ABNORMAL HIGH (ref 70–99)
Glucose-Capillary: 287 mg/dL — ABNORMAL HIGH (ref 70–99)
Glucose-Capillary: 298 mg/dL — ABNORMAL HIGH (ref 70–99)
Glucose-Capillary: 340 mg/dL — ABNORMAL HIGH (ref 70–99)

## 2018-12-20 LAB — PHOSPHORUS: Phosphorus: 5.1 mg/dL — ABNORMAL HIGH (ref 2.5–4.6)

## 2018-12-20 LAB — MAGNESIUM: Magnesium: 1.8 mg/dL (ref 1.7–2.4)

## 2018-12-20 LAB — UREA NITROGEN, URINE: Urea Nitrogen, Ur: 320 mg/dL

## 2018-12-20 MED ORDER — INSULIN ASPART 100 UNIT/ML ~~LOC~~ SOLN: 6.0000 [IU] | Freq: Three times a day (TID) | SUBCUTANEOUS | Status: DC

## 2018-12-20 MED ORDER — SODIUM CHLORIDE 0.9 % IV SOLN
INTRAVENOUS | Status: DC
  Administered 2018-12-20 – 2018-12-21 (×3): via INTRAVENOUS

## 2018-12-20 MED ORDER — SODIUM BICARBONATE 650 MG PO TABS
650.0000 mg | ORAL_TABLET | Freq: Two times a day (BID) | ORAL | Status: DC
  Administered 2018-12-20 – 2018-12-22 (×5): 650 mg via ORAL
  Filled 2018-12-20 (×5): qty 1

## 2018-12-20 MED ORDER — INSULIN GLARGINE 100 UNIT/ML ~~LOC~~ SOLN
30.0000 [IU] | Freq: Two times a day (BID) | SUBCUTANEOUS | Status: DC
  Administered 2018-12-20 (×2): 30 [IU] via SUBCUTANEOUS
  Filled 2018-12-20 (×5): qty 0.3

## 2018-12-20 MED ORDER — INSULIN ASPART 100 UNIT/ML ~~LOC~~ SOLN
0.0000 [IU] | Freq: Three times a day (TID) | SUBCUTANEOUS | Status: DC
  Administered 2018-12-20: 8 [IU] via SUBCUTANEOUS

## 2018-12-20 MED ORDER — INSULIN ASPART 100 UNIT/ML ~~LOC~~ SOLN
10.0000 [IU] | Freq: Three times a day (TID) | SUBCUTANEOUS | Status: DC
  Administered 2018-12-20 (×2): 10 [IU] via SUBCUTANEOUS

## 2018-12-20 MED ORDER — INSULIN ASPART 100 UNIT/ML ~~LOC~~ SOLN
0.0000 [IU] | Freq: Every day | SUBCUTANEOUS | Status: DC
  Administered 2018-12-20: 5 [IU] via SUBCUTANEOUS
  Administered 2018-12-21: 4 [IU] via SUBCUTANEOUS

## 2018-12-20 MED ORDER — INSULIN ASPART 100 UNIT/ML ~~LOC~~ SOLN
0.0000 [IU] | Freq: Three times a day (TID) | SUBCUTANEOUS | Status: DC
  Administered 2018-12-21 (×2): 7 [IU] via SUBCUTANEOUS
  Administered 2018-12-22 (×2): 11 [IU] via SUBCUTANEOUS

## 2018-12-20 NOTE — Progress Notes (Addendum)
Nutrition Consult/Education Note  RD consulted for nutrition education regarding diabetes.   Lab Results  Component Value Date   HGBA1C 14.9 (H) 12/19/2018   RD provided "Carbohydrate Counting for People with Diabetes" handout from the Academy of Nutrition and Dietetics. Discussed different food groups and their effects on blood sugar, emphasizing carbohydrate-containing foods. Provided list of carbohydrates and recommended serving sizes of common foods.  Discussed importance of controlled and consistent carbohydrate intake throughout the day. Provided examples of ways to balance meals/snacks and encouraged intake of high-fiber, whole grain complex carbohydrates. Teach back method used.  Expect fair compliance.  Body mass index is 39.7 kg/m. Pt meets criteria for Obesity Class II based on current BMI.  Currently NPO for procedure. Reports a good appetite. Labs & medications reviewed.  No further nutrition interventions warranted at this time. Pt appreciative with RD visit.  If additional nutrition issues arise, please re-consult RD.  [name], RD, LDN Pager #: [phone] After-Hours Pager #: [phone]

## 2018-12-20 NOTE — Progress Notes (Signed)
Spoke with patient on the phone. He was asking lots of dietary questions. Encouraged him to write down his questions. Also encouraged him to give his own insulin injections for practice when the nurse would be in the room to give injection. Spoke with wife on the phone, as well. Asked her to check his insurance coverage for Lantus, Levemir, and Novolog. States that he has [employer] for 18 months and it would last at least until the end of the year. Case manager will be talking with patient about what insulin he may need to go home on. Patient does need a PCP to follow him at discharge. Wife asked good questions and is very good support for her husband. Both seem overwhelmed with this new diagnosis of diabetes along with his other health issues.  Will follow up with patient tomorrow. Will continue to monitor blood sugars while in the hospital.  Harvel Ricks RN BSN CDE Diabetes Coordinator Pager: [phone]  8am-5pm

## 2018-12-20 NOTE — Progress Notes (Signed)
PROGRESS NOTE    George Lewis.  AOZ:[id number] DOB: 09/22/1955 DOA: 12/19/2018 PCP: Pc, [facility]      Brief Narrative:  George Lewis is a 63 y.o. M with obesity, RCC s/p R nephrectomy, CKD IV with Fistula, follows with Dr. Hollie Salk, does not have PCP, and HTN untreated who presents with 2 weeks weakness, malaise, polyuria and now found to have abnormal labs and referred to the ER.  Yesterday, he had routine labs done by his Urologist, who called him after hours and recommended he go to the ER.  In the ER, Glucose >900, tachycardic, bicarb 15, AG >16, creatinine up to 4.8 from baseline 3.5.  He was started on IV fluids and insulin drip.      Assessment & Plan:  Diabetic ketoacidosis Suspected longstanding type 2 diabetes For the last 5 years, the patient has been using urgent care as his PCP.  He thinks that he "may have" been told in the past he has diabetes, but has never been on treatment.  A1c >14%. Gap closed yesterday, kept on insluin 24 hours to gauge insulin needs, >150 units per 24 hours -Stop insulin gtt -Start Lantus 30 BID -Start TID scheduled aspart plus sliding scale   Metabolic acidosis Acute on chronic renal failure Last Cr 3.4 in January.  Cr improved to 4.2 overnight, then back up to 4.5.  May still be dehydrated or this may be new baseline.    BHOB and lactate normal. Suspect acidosis at this point is largely renal.   -Consult nephrology, appreciate expert cares -Continue new PO Bicarb -Continue IV fluids -Strict I/Os  Hypertension BP normal -Hold home Lasix and metoprolol for now  Renal cell cancer  S/P nephrectomy in 2019  BPH -Continue Flomax  Pancreatic neck mass 1.1cm indeterminate pancreatic mass noted incidentally on MRI abdomen from 02/2018 in setting of RCC, recommended 3-6 month repeat noncon MRI, which patient did not get.  I obtained CT without contrats today, discussed with Radiology.  This has no obvious mass,  although the 1.1cm mass from last July could easily have been missed.  There are however no worrisome findings like dilated ducts or distortion of pancreatic architecture. -I recommend outpatient MRI abdomen without contrast in 1-3 months to follow up       DVT prophylaxis: heparin Code Status: FULL Family Communication: wife by phone MDM and disposition Plan: The below labs and imaging reports reviewed and summarized above.  Medication management as above.  The patient was admitted with DKA and worsening renal failure.  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  We will continue IV fluids try to improve his kidney hydrodynamics.  We will transition to subQ insulin and complete diabetes teaching.    This is an acute illness (DKA superimposed on severe chronic kidney disease) that poses a major threat to permanent renal function.  An extensive complexity of data was involved in decision making. .  Furthermore, at this time, continued inpatient services are reasonable and expected, given the patient's severity of presentation: including his advanced comorbid renal failure, ongoing IV fluids, and acidosis, and severe hyperglycemia as well as the high likelihood of an adverse outcome, including readmission, debility or death if the patient were to be discharged prematurely.     Objective: Vitals:   12/20/18 0815 12/20/18 0935 12/20/18 1024 12/20/18 1139  BP:  138/75    Pulse:  86    Resp:  16    Temp: 97.8 F (  36.6 C)   98.1 F (36.7 C)  TempSrc: Oral   Oral  SpO2:  100%    Weight:   (!) 136.5 kg   Height:   6\' 1"  (1.854 m)     Intake/Output Summary (Last 24 hours) at 12/20/2018 1432 Last data filed at 12/20/2018 1300 Gross per 24 hour  Intake 2528.27 ml  Output 675 ml  Net 1853.27 ml   Filed Weights   12/20/18 1024  Weight: (!) 136.5 kg    Examination: BP 138/75 (BP Location: Right Arm)    Pulse 86    Temp 98.1 F (36.7 C) (Oral)    Resp 16    Ht 6\' 1"   (1.854 m)    Wt (!) 136.5 kg    SpO2 100%    BMI 39.70 kg/m   General: Healthy, alert and in no distress.  Responds appropriately to questions.  Eye contact, dress and hygiene appropriate. HEENT: Corneas clear, conjunctivae and sclerae normal without injection or icterus, lids and lashes normal.  Visual tracking smooth.  OP moist without erythema, exudates, cobblestoning, or ulcers.  No airway deformities.  Neck supple.   Cardiac: RRR, nl S1-S2, no murmurs, rubs, gallops.  Capillary refill is less than 2 seconds.   Respiratory: Normal respiratory rate and rhythm.  CTAB without rales or wheezes. Abdomen: BS present.  No TTP or rebound all quadrants.  No masses or organomegaly.  No scars.  No striae, dilated veins, rashes, or lesions.  No ascites, distension. Extremities: No deformities/injuries.  5/5 grip strength and upper extremity flexion/extension, symmetrically.  Extremities are warm and well-perfused. Neuro: Sensorium intact.  Speech is fluent.  Naming is grossly intact, and the patient's recall, recent and remote, as well as general fund of knowledge seem within normal limits.  Muscle tone normal, without fasciculations.  Moves all extremities equally and with normal coordination.  Attention span and concentration are within normal limits.  Psych: Mood "okay", blunted range of affect.  Normal rate and rhythm of speech.  Thought content appropriate, and thought process linear.  No SI/HI, aural or visual hallucinations or delusions. Attention and concentration are normal.       Data Reviewed: I have personally reviewed following labs and imaging studies:  CBC: Recent Labs  Lab 12/19/18 0048 12/19/18 0055 12/20/18 0600  WBC 14.7*  --  11.9*  NEUTROABS 10.8*  --   --   HGB 11.7* 12.2* 11.3*  HCT 35.8* 36.0* 33.1*  MCV 85.2  --  82.1  PLT 278  --  650   Basic Metabolic Panel: Recent Labs  Lab 12/19/18 0048  12/19/18 1442 12/19/18 1903 12/19/18 2209 12/20/18 0228 12/20/18 0600    NA 116*   < > 133* 132* 131* 134* 134*  K 5.0   < > 4.0 3.9 3.7 3.5 3.7  CL 85*   < > 101 101 103 107 104  CO2 15*   < > 18* 17* 17* 16* 16*  GLUCOSE 929*   < > 180* 204* 223* 142* 180*  BUN 80*   < > 74* 73* 73* 73* 71*  CREATININE 4.86*   < > 4.80* 4.77* 4.48* 4.29* 4.36*  CALCIUM 8.3*   < > 8.7* 8.2* 8.2* 8.4* 8.4*  MG 1.7  --   --   --   --  1.8  --   PHOS 5.3*  --   --   --   --  5.1*  --    < > = values in  this interval not displayed.   GFR: Estimated Creatinine Clearance: 26.1 mL/min (A) (by C-G formula based on SCr of 4.36 mg/dL (H)). Liver Function Tests: Recent Labs  Lab 12/19/18 0048  AST 16  ALT 7  ALKPHOS 128*  BILITOT 0.5  PROT 7.5  ALBUMIN 3.0*   No results for input(s): LIPASE, AMYLASE in the last 168 hours. No results for input(s): AMMONIA in the last 168 hours. Coagulation Profile: No results for input(s): INR, PROTIME in the last 168 hours. Cardiac Enzymes: No results for input(s): CKTOTAL, CKMB, CKMBINDEX, TROPONINI in the last 168 hours. BNP (last 3 results) No results for input(s): PROBNP in the last 8760 hours. HbA1C: Recent Labs    12/19/18 0238  HGBA1C 14.9*   CBG: Recent Labs  Lab 12/20/18 0903 12/20/18 0924 12/20/18 1107 12/20/18 1137 12/20/18 1246  GLUCAP 172* 178* 185* 197* 298*   Lipid Profile: No results for input(s): CHOL, HDL, LDLCALC, TRIG, CHOLHDL, LDLDIRECT in the last 72 hours. Thyroid Function Tests: No results for input(s): TSH, T4TOTAL, FREET4, T3FREE, THYROIDAB in the last 72 hours. Anemia Panel: No results for input(s): VITAMINB12, FOLATE, FERRITIN, TIBC, IRON, RETICCTPCT in the last 72 hours. Urine analysis:    Component Value Date/Time   COLORURINE STRAW (A) 12/19/2018 0305   APPEARANCEUR CLEAR 12/19/2018 0305   LABSPEC 1.014 12/19/2018 0305   PHURINE 6.0 12/19/2018 0305   GLUCOSEU >=500 (A) 12/19/2018 0305   HGBUR SMALL (A) 12/19/2018 0305   BILIRUBINUR NEGATIVE 12/19/2018 0305   KETONESUR NEGATIVE  12/19/2018 0305   PROTEINUR 30 (A) 12/19/2018 0305   NITRITE NEGATIVE 12/19/2018 0305   LEUKOCYTESUR SMALL (A) 12/19/2018 0305   Sepsis Labs: @LABRCNTIP (procalcitonin:4,lacticacidven:4)  )No results found for this or any previous visit (from the past 240 hour(s)).       Radiology Studies: Ct Abdomen Pelvis Wo Contrast  Result Date: 12/20/2018 CLINICAL DATA:  Pancreatic pseudocyst. EXAM: CT ABDOMEN AND PELVIS WITHOUT CONTRAST TECHNIQUE: Multidetector CT imaging of the abdomen and pelvis was performed following the standard protocol without IV contrast. COMPARISON:  CT scan of September 05, 2018. FINDINGS: Lower chest: No acute abnormality. Hepatobiliary: No gallstones or biliary dilatation is noted. Hepatic steatosis is noted. Pancreas: Unremarkable. No pancreatic ductal dilatation or surrounding inflammatory changes. Spleen: Normal in size without focal abnormality. Adrenals/Urinary Tract: Adrenal glands appear normal. Status post right nephrectomy. Nonobstructive left nephrolithiasis is noted. Left renal cyst is noted which is unchanged. Mild left hydroureteronephrosis is noted without obstructive calculus. Urinary bladder is unremarkable. Stomach/Bowel: The stomach appears normal. There is no evidence of bowel obstruction or inflammation. The appendix is not clearly visualized. Diverticulosis of descending and sigmoid colon is noted without inflammation. Vascular/Lymphatic: Aortic atherosclerosis. No enlarged abdominal or pelvic lymph nodes. Reproductive: Prostate is unremarkable. Other: Small fat containing periumbilical hernia is noted. No ascites is noted. Musculoskeletal: No acute or significant osseous findings. IMPRESSION: Hepatic steatosis. Left nephrolithiasis. Mild left hydronephrosis is noted without evidence of obstructing calculus. Status post right nephrectomy. Diverticulosis of descending and sigmoid colon without inflammation. Aortic Atherosclerosis (ICD10-I70.0). Electronically Signed    By: Marijo Conception M.D.   On: 12/20/2018 12:20   Dg Chest Port 1 View  Result Date: 12/19/2018 CLINICAL DATA:  Weakness EXAM: PORTABLE CHEST 1 VIEW COMPARISON:  06/12/2018 FINDINGS: Heart and mediastinal contours are within normal limits. No focal opacities or effusions. No acute bony abnormality. IMPRESSION: No active disease. Electronically Signed   By: Rolm Baptise M.D.   On: 12/19/2018 01:09  Scheduled Meds:  heparin  5,000 Units Subcutaneous Q8H   insulin aspart  0-15 Units Subcutaneous TID WC   insulin aspart  0-5 Units Subcutaneous QHS   insulin aspart  10 Units Subcutaneous TID WC   insulin glargine  30 Units Subcutaneous BID   sodium bicarbonate  650 mg Oral BID   Continuous Infusions:  sodium chloride 100 mL/hr at 12/20/18 1303     LOS: 0 days    Time spent: 35 minutes    Edwin Dada, MD [facility] 12/20/2018, 2:32 PM     Pager [phone] --- please page though AMION:  [email or web address] Password [email or web address] If 7PM-7AM, please contact night-coverage

## 2018-12-20 NOTE — Progress Notes (Signed)
[name] Progress Note  Name: George Lewis. MRN: [id number] DOB: [birth year]  Chief Complaint:  Weakness   Subjective:  Feels ok today.  Strict ins/outs not available but reports no difficulty voiding and 900 mL + is charted.  He is currently off of IV fluids.  The patient states that he saw vascular surgery about a month ago and that they felt his fistula was maturing appropriately and released him.  We discussed that his renal function was still below his baseline.  Review of systems:  Denies shortness of breath or chest pain Denies nausea or vomiting  ------------------ Background on consult:  George Lewis. is a 63 y.o. male with a history of obesity, renal cell carcinoma status post nephrectomy, CKD stage IV followed by our office with Dr. Hollie Salk who presented with weakness.  He was found on outpatient labs to have markedly elevated glucose and DKA.  He has been initiated on fluids and insulin.  Note that creatinine was 3.8 in 05/2018.  Creatinine was also elevated at 4.86 on admission.  It is improved to 4.47 on most recent BMP.  Note that he does have a fistula in place. He last saw Dr. Hollie Salk on 09/17/18 and creatinine was 3.4 at that time with concern for CKD progression.  He denies any shortness of breath or nausea.  He has been making good urine output per his report.   Intake/Output Summary (Last 24 hours) at 12/20/2018 1122 Last data filed at 12/20/2018 0948 Gross per 24 hour  Intake 2528.27 ml  Output 925 ml  Net 1603.27 ml    Vitals:  Vitals:   12/20/18 0547 12/20/18 0815 12/20/18 0935 12/20/18 1024  BP: 134/75  138/75   Pulse: 94  86   Resp: 19  16   Temp: 98.5 F (36.9 C) 97.8 F (36.6 C)    TempSrc: Oral Oral    SpO2: 98%  100%   Weight:    (!) 136.5 kg  Height:    6\' 1"  (1.854 m)     Physical Exam:  General adult male in bed in no acute distress  Heart regular rate and rhythm no rub appreciated Lungs clear to auscultation  bilaterally normal work of breathing at rest Abdomen softly distended/obese/nontender Extremities no lower extremity edema Skin no rash on extremities exposed Neuro alert and oriented x3 provides a history Access left upper extremity aVF with a bruit and thrill  Medications reviewed   Labs:  BMP Latest Ref Rng & Units 12/20/2018 12/20/2018 12/19/2018  Glucose 70 - 99 mg/dL 180(H) 142(H) 223(H)  BUN 6 - 20 mg/dL 71(H) 73(H) 73(H)  Creatinine 0.61 - 1.24 mg/dL 4.36(H) 4.29(H) 4.48(H)  Sodium 135 - 145 mmol/L 134(L) 134(L) 131(L)  Potassium 3.5 - 5.1 mmol/L 3.7 3.5 3.7  Chloride 98 - 111 mmol/L 104 107 103  CO2 22 - 32 mmol/L 16(L) 16(L) 17(L)  Calcium 8.9 - 10.3 mg/dL 8.4(L) 8.4(L) 8.2(L)     Assessment/Plan:   # AKI  - Secondary to pre-renal insults with the DKA -Would resume hydration with normal saline - Note that he may have had some degree of CKD progression as well.   - No acute indication for dialysis.  Will continue to monitor closely for need for dialysis  # CKD stage IV  - Note most recent Cr 3.4 in 08/2018 - Has AVF which was felt slow to mature per [facility]    # Metabolic acidosis - DKA resolving and with underlying  CKD which may also contribute   # DKA - resolving - New diabetes diagnosis per patient report  - continue insulin/fluids per primary team   # HTN with CKD  - Acceptable   # Renal cell carcinoma - s/p nephrectomy  # Anemia  - Secondary in part to CKD  - No acute indication for PRBC's or ESA  Claudia Desanctis, MD 12/20/2018 11:22 AM

## 2018-12-21 ENCOUNTER — Encounter: Payer: Self-pay | Admitting: Gastroenterology

## 2018-12-21 LAB — GLUCOSE, CAPILLARY
Glucose-Capillary: 232 mg/dL — ABNORMAL HIGH (ref 70–99)
Glucose-Capillary: 238 mg/dL — ABNORMAL HIGH (ref 70–99)
Glucose-Capillary: 317 mg/dL — ABNORMAL HIGH (ref 70–99)

## 2018-12-21 LAB — BASIC METABOLIC PANEL
Anion gap: 10 (ref 5–15)
BUN: 67 mg/dL — ABNORMAL HIGH (ref 6–20)
CO2: 16 mmol/L — ABNORMAL LOW (ref 22–32)
Calcium: 8.3 mg/dL — ABNORMAL LOW (ref 8.9–10.3)
Chloride: 108 mmol/L (ref 98–111)
Creatinine, Ser: 4 mg/dL — ABNORMAL HIGH (ref 0.61–1.24)
GFR calc Af Amer: 18 mL/min — ABNORMAL LOW (ref >60)
GFR calc non Af Amer: 15 mL/min — ABNORMAL LOW (ref >60)
Glucose, Bld: 319 mg/dL — ABNORMAL HIGH (ref 70–99)
Potassium: 4.5 mmol/L (ref 3.5–5.1)
Sodium: 134 mmol/L — ABNORMAL LOW (ref 135–145)

## 2018-12-21 LAB — CBC
HCT: 30.9 % — ABNORMAL LOW (ref 39.0–52.0)
Hemoglobin: 10.3 g/dL — ABNORMAL LOW (ref 13.0–17.0)
MCH: 27.8 pg (ref 26.0–34.0)
MCHC: 33.3 g/dL (ref 30.0–36.0)
MCV: 83.5 fL (ref 80.0–100.0)
Platelets: 255 10*3/uL (ref 150–400)
RBC: 3.7 MIL/uL — ABNORMAL LOW (ref 4.22–5.81)
RDW: 14.9 % (ref 11.5–15.5)
WBC: 13.9 10*3/uL — ABNORMAL HIGH (ref 4.0–10.5)
nRBC: 0 % (ref 0.0–0.2)

## 2018-12-21 MED ORDER — INSULIN GLARGINE 100 UNIT/ML ~~LOC~~ SOLN
35.0000 [IU] | Freq: Two times a day (BID) | SUBCUTANEOUS | Status: DC
  Administered 2018-12-21 – 2018-12-22 (×3): 35 [IU] via SUBCUTANEOUS
  Filled 2018-12-21 (×6): qty 0.35

## 2018-12-21 MED ORDER — INSULIN ASPART 100 UNIT/ML ~~LOC~~ SOLN
10.0000 [IU] | Freq: Three times a day (TID) | SUBCUTANEOUS | Status: DC
  Administered 2018-12-21 – 2018-12-22 (×3): 10 [IU] via SUBCUTANEOUS

## 2018-12-21 MED ORDER — INSULIN ASPART 100 UNIT/ML ~~LOC~~ SOLN
15.0000 [IU] | Freq: Three times a day (TID) | SUBCUTANEOUS | Status: DC
  Administered 2018-12-21: 15 [IU] via SUBCUTANEOUS

## 2018-12-21 MED ORDER — METOPROLOL TARTRATE 25 MG PO TABS
25.0000 mg | ORAL_TABLET | Freq: Two times a day (BID) | ORAL | Status: DC
  Administered 2018-12-21 – 2018-12-22 (×2): 25 mg via ORAL
  Filled 2018-12-21 (×2): qty 1

## 2018-12-21 NOTE — Evaluation (Signed)
Physical Therapy Evaluation Patient Details Name: George Lewis. MRN: [id number] DOB: 05-08-1956 Today's Date: 12/21/2018   History of Present Illness  Pt is a 63 y.o. M with significant PMH of obesity, renal cell carcinoma s/p nephrectomy, CKD stage IV who presents with weakness. Found to have elevated glucose and DKA.  Clinical Impression  Patient evaluated by Physical Therapy with no further acute PT needs identified. Pt ambulating hallways independently without physical difficulty. Tachycardic with HR elevated from 115-150 bpm; pt asymptomatic. Education re: generalized walking program, endurance conservation, and activity progression. All education has been completed and the patient has no further questions. No follow-up Physical Therapy or equipment needs. PT is signing off. Thank you for this referral.     Follow Up Recommendations No PT follow up    Equipment Recommendations  None recommended by PT    Recommendations for Other Services       Precautions / Restrictions Precautions Precautions: Other (comment) Precaution Comments: elevated HR Restrictions Weight Bearing Restrictions: No      Mobility  Bed Mobility Overal bed mobility: Modified Independent                Transfers Overall transfer level: Independent Equipment used: None                Ambulation/Gait Ambulation/Gait assistance: Independent Gait Distance (Feet): 350 Feet Assistive device: None Gait Pattern/deviations: Step-through pattern;Wide base of support   Gait velocity interpretation: >2.62 ft/sec, indicative of community ambulatory    Stairs            Wheelchair Mobility    Modified Rankin (Stroke Patients Only)       Balance Overall balance assessment: No apparent balance deficits (not formally assessed)                                           Pertinent Vitals/Pain Pain Assessment: No/denies pain    Home Living Family/patient  expects to be discharged to:: Private residence Living Arrangements: Spouse/significant other Available Help at Discharge: Family;Available PRN/intermittently Type of Home: House Home Access: Stairs to enter Entrance Stairs-Rails: Right Entrance Stairs-Number of Steps: 3 Home Layout: One level Home Equipment: None      Prior Function Level of Independence: Independent         Comments: Sleeps in recliner     Hand Dominance   Dominant Hand: Right    Extremity/Trunk Assessment   Upper Extremity Assessment Upper Extremity Assessment: Overall WFL for tasks assessed    Lower Extremity Assessment Lower Extremity Assessment: Overall WFL for tasks assessed    Cervical / Trunk Assessment Cervical / Trunk Assessment: Other exceptions Cervical / Trunk Exceptions: increased body habitus  Communication   Communication: No difficulties  Cognition Arousal/Alertness: Awake/alert Behavior During Therapy: WFL for tasks assessed/performed Overall Cognitive Status: Within Functional Limits for tasks assessed                                        General Comments      Exercises     Assessment/Plan    PT Assessment Patent does not need any further PT services  PT Problem List         PT Treatment Interventions      PT Goals (Current goals can be found in  the Care Plan section)  Acute Rehab PT Goals Patient Stated Goal: "go home today." PT Goal Formulation: All assessment and education complete, DC therapy    Frequency     Barriers to discharge        Co-evaluation               AM-PAC PT "6 Clicks" Mobility  Outcome Measure Help needed turning from your back to your side while in a flat bed without using bedrails?: None Help needed moving from lying on your back to sitting on the side of a flat bed without using bedrails?: None Help needed moving to and from a bed to a chair (including a wheelchair)?: None Help needed standing up from a  chair using your arms (e.g., wheelchair or bedside chair)?: None Help needed to walk in hospital room?: None Help needed climbing 3-5 steps with a railing? : A Little 6 Click Score: 23    End of Session   Activity Tolerance: Patient tolerated treatment well Patient left: in chair;with call bell/phone within reach Nurse Communication: Mobility status PT Visit Diagnosis: Unsteadiness on feet (R26.81);Difficulty in walking, not elsewhere classified (R26.2)    Time: 6948-5462 PT Time Calculation (min) (ACUTE ONLY): 18 min   Charges:   PT Evaluation $PT Eval Low Complexity: [place], PT, DPT Acute Rehabilitation Services Pager [phone] Office [phone]   Willy Eddy 12/21/2018, 9:04 AM

## 2018-12-21 NOTE — Progress Notes (Signed)
[name] Progress Note  Name: George Lewis. MRN: [id number] DOB: 11-04-1955  Chief Complaint:  Weakness   Subjective:  He had 1.3 liters UOP over 4/23 charted.  He feels well.  Has been hydrated overnight.  He provides his wife's number as the best to contact regarding a follow-up appointment - [phone] and his number as a back-up at [phone].     Review of systems:  Denies shortness of breath or chest pain  Denies nausea or vomiting  ------------------ Background on consult:  George Deman. is a 63 y.o. male with a history of obesity, renal cell carcinoma status post nephrectomy, CKD stage IV followed by our office with Dr. Hollie Salk who presented with weakness.  He was found on outpatient labs to have markedly elevated glucose and DKA.  He has been initiated on fluids and insulin.  Note that creatinine was 3.8 in 05/2018.  Creatinine was also elevated at 4.86 on admission.  It is improved to 4.47 on most recent BMP.  Note that he does have a fistula in place. He last saw Dr. Hollie Salk on 09/17/18 and creatinine was 3.4 at that time with concern for CKD progression.  He denies any shortness of breath or nausea.  He has been making good urine output per his report.   Intake/Output Summary (Last 24 hours) at 12/21/2018 0814 Last data filed at 12/21/2018 0651 Gross per 24 hour  Intake 1987.93 ml  Output 1250 ml  Net 737.93 ml    Vitals:  Vitals:   12/20/18 2013 12/21/18 0025 12/21/18 0432 12/21/18 0433  BP: 133/75 139/88  124/86  Pulse: 100 94 100 100  Resp: 16 20 16 16   Temp: 98.1 F (36.7 C) 97.6 F (36.4 C) 98.2 F (36.8 C) 98.1 F (36.7 C)  TempSrc: Oral Oral Oral Oral  SpO2: 99% 98%  98%  Weight:      Height:         Physical Exam:  General adult male in bed in no acute distress  Heart regular rate and rhythm no rub appreciated Lungs clear to auscultation bilaterally normal work of breathing at rest Abdomen softly  distended/obese/nontender Extremities no lower extremity edema Skin no rash on extremities exposed Neuro alert and oriented x3 provides a history Access left upper extremity aVF with a bruit and thrill  Medications reviewed   Labs:  BMP Latest Ref Rng & Units 12/21/2018 12/20/2018 12/20/2018  Glucose 70 - 99 mg/dL 319(H) 180(H) 142(H)  BUN 6 - 20 mg/dL 67(H) 71(H) 73(H)  Creatinine 0.61 - 1.24 mg/dL 4.00(H) 4.36(H) 4.29(H)  Sodium 135 - 145 mmol/L 134(L) 134(L) 134(L)  Potassium 3.5 - 5.1 mmol/L 4.5 3.7 3.5  Chloride 98 - 111 mmol/L 108 104 107  CO2 22 - 32 mmol/L 16(L) 16(L) 16(L)  Calcium 8.9 - 10.3 mg/dL 8.3(L) 8.4(L) 8.4(L)     Assessment/Plan:   # AKI  - Secondary to pre-renal insults with the DKA - Resolving with supportive care.  No acute indication for dialysis  - Note that he may have had some degree of CKD progression as well.   - I have requested hospital follow-up appt with Dr. Hollie Salk in 2-3 weeks through our office.  Asked for renal profile, CBC, and intact PTH prior to follow-up  # CKD stage IV  - Note most recent Cr 3.4 in 08/2018 - Reviewed last vascular note - fistula felt to be mature and ok for use when needed    # Metabolic  acidosis - DKA resolved and with underlying CKD - Continue sodium bicarbonate   # DKA - resolving - New diabetes diagnosis per patient report  - continue insulin/fluids per primary team   # HTN with CKD  - Acceptable   # Renal cell carcinoma - s/p nephrectomy  # Anemia secondary in part to CKD  - No acute indication for PRBC's or ESA  Stable without the current need for dialysis and ok for discharge from a renal standpoint.    Claudia Desanctis, MD 12/21/2018 8:14 AM

## 2018-12-21 NOTE — Progress Notes (Signed)
Inpatient Diabetes Program Recommendations  AACE/ADA: New Consensus Statement on Inpatient Glycemic Control (2015)  Target Ranges:  Prepandial:   less than 140 mg/dL      Peak postprandial:   less than 180 mg/dL (1-2 hours)      Critically ill patients:  140 - 180 mg/dL   Lab Results  Component Value Date   GLUCAP 232 (H) 12/21/2018   HGBA1C 14.9 (H) 12/19/2018    Spoke with patient's wife by phone. Patient verified with insurance company that it covers Lantus and Humalog. MD please order at discharge insulin pens. Pen needles (#[id number]) Patient will need glucose meter at discharge. Blood glucose meter kit (includes lancets and strips) ([id number]).  Additionally, further reviewed DM diagnosis, survival skills, injections schedule, current dosages of Lantus/Novolog for inpatient regimen, diet, importance of eliminating sugary beverages, and checking blood sugars.  Wife would like a follow up call from CM when they consult patient. No further questions at this time. Wife informed that patient's meal had arrived late this morning, explaining why insulin administration was delayed.   Thanks, Bronson Curb, MSN, RNC-OB Diabetes Coordinator [phone] (8a-5p)

## 2018-12-21 NOTE — Progress Notes (Addendum)
Received voicemail and text from patient spouse, Milas Schappell, wanting to relay message to "diabetes specialist." She stated in message that patient's insurance covers both the 24 hour insulins - levemir and lantus, but for short term patient will need Humalog, not Novolog. Call back for Mrs. Zuercher is [phone]. 5W CM aware. Page sent to diabetes coordinator. Spoke with Mrs. Lafitte to advise of message received and forwarded.   Manya Silvas RN CM Transitions of Care 57M CM [phone]

## 2018-12-21 NOTE — Progress Notes (Signed)
Pt with 13 Beats VTAC at 0318  . Pt was moving in bed and nothing worrisome . Pt Currently running NSR on monitor

## 2018-12-21 NOTE — Progress Notes (Signed)
PROGRESS NOTE    Jamse Lewis.  QIW:[id number] DOB: Jul 18, 1956 DOA: 12/19/2018 PCP: Steffanie Dunn robbins      Brief Narrative:  Mr. Stage is a 63 y.o. M with obesity, RCC s/p R nephrectomy, CKD IV with Fistula, follows with Dr. Hollie Salk, does not have PCP, and HTN untreated who presents with 2 weeks weakness, malaise, polyuria and now found to have abnormal labs and referred to the ER.  Yesterday, he had routine labs done by his Urologist, who called him after hours and recommended he go to the ER.  In the ER, Glucose >900, tachycardic, bicarb 15, AG >16, creatinine up to 4.8 from baseline 3.5.  He was started on IV fluids and insulin drip.      Assessment & Plan:  Diabetic ketoacidosis Suspected longstanding type 2 diabetes For the last 5 years, the patient has been using urgent care as his PCP.  He thinks that he "may have" been told in the past he has diabetes, but has never been on treatment.  A1c >14%.   Transitioned to subq insulin yesterday, sugars high in the afternoon, 200-300. -Increase Lantus -Continue aspart and SSI -Not candidate for metformin   Metabolic acidosis Acute on chronic renal failure Last Cr 3.4 in January.  Cr improved to 4.0.    BHOB and lactate normal.     -Consult nephrology, appreciate expert cares -Continue new PO Bicarb -Strict I/Os  Hypertension BP normal -Hold home Lasix  - resume metoprolol for now  Renal cell cancer  S/P nephrectomy in 2019  BPH -Continue Flomax  Pancreatic neck mass 1.1cm indeterminate pancreatic mass noted incidentally on MRI abdomen from 02/2018 in setting of RCC, recommended 3-6 month repeat noncon MRI, which patient did not get.  I obtained CT without contrats today, discussed with Radiology.  This has no obvious mass, although the 1.1cm mass from last July could easily have been missed.  There are however no worrisome findings like dilated ducts or distortion of pancreatic architecture. -I recommend  outpatient MRI abdomen without contrast in 1-3 months to follow up       DVT prophylaxis: Heparin Code Status: FULL Family Communication: wife by phone MDM and disposition Plan:  The below labs and imaging reports were reviewed.  Medication managemetn as above.  The patient was admitted with DKA and worsening renal failure.  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  We will stop IV fluids and monitor Cr overnight.  Continue titrating insulins and metoprolol    Objective: Vitals:   12/21/18 0432 12/21/18 0433 12/21/18 1229 12/21/18 1508  BP:  124/86 108/71 (!) 150/107  Pulse: 100 100 (!) 111 78  Resp: 16 16 18  (!) 25  Temp: 98.2 F (36.8 C) 98.1 F (36.7 C) 98.8 F (37.1 C) 98.4 F (36.9 C)  TempSrc: Oral Oral Oral Oral  SpO2:  98% 97% 99%  Weight:      Height:        Intake/Output Summary (Last 24 hours) at 12/21/2018 1630 Last data filed at 12/21/2018 0651 Gross per 24 hour  Intake 1743.93 ml  Output 1250 ml  Net 493.93 ml   Filed Weights   12/20/18 1024  Weight: (!) 136.5 kg    Examination: BP (!) 150/107 (BP Location: Right Arm)    Pulse 78    Temp 98.4 F (36.9 C) (Oral)    Resp (!) 25    Ht 6\' 1"  (1.854 m)    Wt (!) 136.5 kg  SpO2 99%    BMI 39.70 kg/m   General: B's adult male, being in recliner, no acute distress HEENT: C corneas clear, conjunctive is clear without injection or icterus, lids and lashes normal.  No epistaxis, discharge, or nasal deformity.  Oropharynx moist, no oral lesions, lips normal, dentition good. Cardiac: Rate and rhythm, no murmurs, JVP not visible, no lower extremity edema Respiratory: Normal respiratory rate and rhythm without rales or wheezes. Abdomen: Abdomen soft without tenderness to palpation, no distention or hepatosplenomegaly. Extremities: No deformities/injuries.  5/5 grip strength and upper extremity flexion/extension, symmetrically.  Extremities are warm and well-perfused. Neuro: Alert and  oriented, naming is grossly intact, speech fluent, moves all extremities with normal coordination and strength Psych: Attention normal, affect normal,    Data Reviewed: I have personally reviewed following labs and imaging studies:  CBC: Recent Labs  Lab 12/19/18 0048 12/19/18 0055 12/20/18 0600 12/21/18 0234  WBC 14.7*  --  11.9* 13.9*  NEUTROABS 10.8*  --   --   --   HGB 11.7* 12.2* 11.3* 10.3*  HCT 35.8* 36.0* 33.1* 30.9*  MCV 85.2  --  82.1 83.5  PLT 278  --  267 124   Basic Metabolic Panel: Recent Labs  Lab 12/19/18 0048  12/19/18 1903 12/19/18 2209 12/20/18 0228 12/20/18 0600 12/21/18 0234  NA 116*   < > 132* 131* 134* 134* 134*  K 5.0   < > 3.9 3.7 3.5 3.7 4.5  CL 85*   < > 101 103 107 104 108  CO2 15*   < > 17* 17* 16* 16* 16*  GLUCOSE 929*   < > 204* 223* 142* 180* 319*  BUN 80*   < > 73* 73* 73* 71* 67*  CREATININE 4.86*   < > 4.77* 4.48* 4.29* 4.36* 4.00*  CALCIUM 8.3*   < > 8.2* 8.2* 8.4* 8.4* 8.3*  MG 1.7  --   --   --  1.8  --   --   PHOS 5.3*  --   --   --  5.1*  --   --    < > = values in this interval not displayed.   GFR: Estimated Creatinine Clearance: 28.5 mL/min (A) (by C-G formula based on SCr of 4 mg/dL (H)). Liver Function Tests: Recent Labs  Lab 12/19/18 0048  AST 16  ALT 7  ALKPHOS 128*  BILITOT 0.5  PROT 7.5  ALBUMIN 3.0*   No results for input(s): LIPASE, AMYLASE in the last 168 hours. No results for input(s): AMMONIA in the last 168 hours. Coagulation Profile: No results for input(s): INR, PROTIME in the last 168 hours. Cardiac Enzymes: No results for input(s): CKTOTAL, CKMB, CKMBINDEX, TROPONINI in the last 168 hours. BNP (last 3 results) No results for input(s): PROBNP in the last 8760 hours. HbA1C: Recent Labs    12/19/18 0238  HGBA1C 14.9*   CBG: Recent Labs  Lab 12/20/18 1137 12/20/18 1246 12/20/18 1900 12/20/18 2239 12/21/18 0922  GLUCAP 197* 298* 287* 340* 232*   Lipid Profile: No results for input(s):  CHOL, HDL, LDLCALC, TRIG, CHOLHDL, LDLDIRECT in the last 72 hours. Thyroid Function Tests: No results for input(s): TSH, T4TOTAL, FREET4, T3FREE, THYROIDAB in the last 72 hours. Anemia Panel: No results for input(s): VITAMINB12, FOLATE, FERRITIN, TIBC, IRON, RETICCTPCT in the last 72 hours. Urine analysis:    Component Value Date/Time   COLORURINE STRAW (A) 12/19/2018 0305   APPEARANCEUR CLEAR 12/19/2018 0305   LABSPEC 1.014 12/19/2018 0305  PHURINE 6.0 12/19/2018 0305   GLUCOSEU >=500 (A) 12/19/2018 0305   HGBUR SMALL (A) 12/19/2018 0305   BILIRUBINUR NEGATIVE 12/19/2018 0305   KETONESUR NEGATIVE 12/19/2018 0305   PROTEINUR 30 (A) 12/19/2018 0305   NITRITE NEGATIVE 12/19/2018 0305   LEUKOCYTESUR SMALL (A) 12/19/2018 0305   Sepsis Labs: @LABRCNTIP (procalcitonin:4,lacticacidven:4)  )No results found for this or any previous visit (from the past 240 hour(s)).       Radiology Studies: Ct Abdomen Pelvis Wo Contrast  Result Date: 12/20/2018 CLINICAL DATA:  Pancreatic pseudocyst. EXAM: CT ABDOMEN AND PELVIS WITHOUT CONTRAST TECHNIQUE: Multidetector CT imaging of the abdomen and pelvis was performed following the standard protocol without IV contrast. COMPARISON:  CT scan of September 05, 2018. FINDINGS: Lower chest: No acute abnormality. Hepatobiliary: No gallstones or biliary dilatation is noted. Hepatic steatosis is noted. Pancreas: Unremarkable. No pancreatic ductal dilatation or surrounding inflammatory changes. Spleen: Normal in size without focal abnormality. Adrenals/Urinary Tract: Adrenal glands appear normal. Status post right nephrectomy. Nonobstructive left nephrolithiasis is noted. Left renal cyst is noted which is unchanged. Mild left hydroureteronephrosis is noted without obstructive calculus. Urinary bladder is unremarkable. Stomach/Bowel: The stomach appears normal. There is no evidence of bowel obstruction or inflammation. The appendix is not clearly visualized.  Diverticulosis of descending and sigmoid colon is noted without inflammation. Vascular/Lymphatic: Aortic atherosclerosis. No enlarged abdominal or pelvic lymph nodes. Reproductive: Prostate is unremarkable. Other: Small fat containing periumbilical hernia is noted. No ascites is noted. Musculoskeletal: No acute or significant osseous findings. IMPRESSION: Hepatic steatosis. Left nephrolithiasis. Mild left hydronephrosis is noted without evidence of obstructing calculus. Status post right nephrectomy. Diverticulosis of descending and sigmoid colon without inflammation. Aortic Atherosclerosis (ICD10-I70.0). Electronically Signed   By: Marijo Conception M.D.   On: 12/20/2018 12:20        Scheduled Meds:  heparin  5,000 Units Subcutaneous Q8H   insulin aspart  0-20 Units Subcutaneous TID WC   insulin aspart  0-5 Units Subcutaneous QHS   insulin aspart  10 Units Subcutaneous TID WC   insulin glargine  35 Units Subcutaneous BID   sodium bicarbonate  650 mg Oral BID   Continuous Infusions:  sodium chloride 100 mL/hr at 12/21/18 1139     LOS: 1 day    Time spent: 25 minutes    Edwin Dada, MD [facility] 12/21/2018, 4:30 PM     Pager [phone] --- please page though AMION:  [email or web address] Password [email or web address] If 7PM-7AM, please contact night-coverage

## 2018-12-22 LAB — CBC
HCT: 31.5 % — ABNORMAL LOW (ref 39.0–52.0)
Hemoglobin: 10.3 g/dL — ABNORMAL LOW (ref 13.0–17.0)
MCH: 27.5 pg (ref 26.0–34.0)
MCHC: 32.7 g/dL (ref 30.0–36.0)
MCV: 84 fL (ref 80.0–100.0)
Platelets: 257 10*3/uL (ref 150–400)
RBC: 3.75 MIL/uL — ABNORMAL LOW (ref 4.22–5.81)
RDW: 15.4 % (ref 11.5–15.5)
WBC: 11.6 10*3/uL — ABNORMAL HIGH (ref 4.0–10.5)
nRBC: 0 % (ref 0.0–0.2)

## 2018-12-22 LAB — BASIC METABOLIC PANEL
Anion gap: 11 (ref 5–15)
BUN: 60 mg/dL — ABNORMAL HIGH (ref 6–20)
CO2: 14 mmol/L — ABNORMAL LOW (ref 22–32)
Calcium: 8.6 mg/dL — ABNORMAL LOW (ref 8.9–10.3)
Chloride: 112 mmol/L — ABNORMAL HIGH (ref 98–111)
Creatinine, Ser: 3.51 mg/dL — ABNORMAL HIGH (ref 0.61–1.24)
GFR calc Af Amer: 21 mL/min — ABNORMAL LOW (ref >60)
GFR calc non Af Amer: 18 mL/min — ABNORMAL LOW (ref >60)
Glucose, Bld: 221 mg/dL — ABNORMAL HIGH (ref 70–99)
Potassium: 4.2 mmol/L (ref 3.5–5.1)
Sodium: 137 mmol/L (ref 135–145)

## 2018-12-22 LAB — GLUCOSE, CAPILLARY
Glucose-Capillary: 262 mg/dL — ABNORMAL HIGH (ref 70–99)
Glucose-Capillary: 256 mg/dL — ABNORMAL HIGH (ref 70–99)

## 2018-12-22 MED ORDER — BLOOD GLUCOSE METER KIT: PACK | 0 refills | Status: AC

## 2018-12-22 MED ORDER — INSULIN GLARGINE 100 UNIT/ML ~~LOC~~ SOLN: 36.0000 [IU] | Freq: Two times a day (BID) | SUBCUTANEOUS | 11 refills | Status: DC

## 2018-12-22 MED ORDER — ALLOPURINOL 100 MG PO TABS: 100.0000 mg | ORAL_TABLET | Freq: Every evening | ORAL | 0 refills | Status: DC

## 2018-12-22 MED ORDER — INSULIN LISPRO 100 UNIT/ML CARTRIDGE: 10.0000 [IU] | Freq: Three times a day (TID) | SUBCUTANEOUS | 11 refills | Status: DC

## 2018-12-22 MED ORDER — SODIUM BICARBONATE 650 MG PO TABS: 650.0000 mg | ORAL_TABLET | Freq: Two times a day (BID) | ORAL | 2 refills | Status: DC

## 2018-12-22 NOTE — Discharge Summary (Signed)
Physician Discharge Summary  [name]. ORV:[id number] DOB: 02/14/1956 DOA: 12/19/2018  PCP: Myrlene Broker, MD  Admit date: 12/19/2018 Discharge date: 12/22/2018  Admitted From: Home  Disposition:  Home   Recommendations for Outpatient Follow-up:  1. Follow up with PCP Dr. Unk Lightning in 1-2 weeks or as soon as able 2. Please follow up with Dr. Hollie Salk, Nephrology in 2-3 weeks 3. Dr. Hollie Salk: Please obtain CBC, BMP and intact PTH prior to follow up 4. Dr. Unk Lightning: Please arrange following tests 1. Echocardiogram for follow up nonsustained asymptomatic VT noted incidentally on Tele in the hospital 2. MRI abdomen without contrast in 3-6 months to re-evaluate pancreatic mass     Home Health: None  Equipment/Devices: None  Discharge Condition: Good  CODE STATUS: FULL Diet recommendation: Diabetic, renal  Brief/Interim Summary: George Lewis is a 63 y.o. M with obesity, RCC s/p R nephrectomy, CKD IV with Fistula, follows with Dr. Hollie Salk, does not have PCP, and HTN untreated who presents with 2 weeks weakness, malaise, polyuria and now found to have abnormal labs and referred to the ER.  Yesterday, he had routine labs done by his Urologist, who called him after hours and recommended he go to the ER.  In the ER, Glucose >900, tachycardic, bicarb 15, AG >16, creatinine up to 4.8 from baseline 3.5.  He was started on IV fluids and insulin drip.     PRINCIPAL HOSPITAL DIAGNOSIS: New onset diabetes, acute metabolic acidosis    Discharge Diagnoses:   Diabetic ketoacidosis Suspected longstanding type 2 diabetes For the last 5 years, the patient has only used what sounds like an urgent care as his primary care.  He thinks that he "may have" been told in the past he has diabetes, but has never been on treatment.  Here, found to have A1c >14%.   Initially admitted on insulin infusion.  His gap remained persistently high, likely from acute on chronic renal failure (BHOB level was  low).  He was continued for 24 hours on insulin infusion and total 24 hours insulin needs were >125 units.    Transitioned to subcutaneous insulin and monitored for 24 hours while hydration was continued.  Discharged on: Lantus 36 BID Humalog 10, 12, 12 + sliding scale       Acute on chronic renal failure Metabolic acidosis Last Cr 3.4 in January.  Admitted with creatinine 4.8 mg/dL.    He was given IV fluids for 72 hours and this improved by day of discharge to 3.5 mg/dL, which was his most recent creatinine with Dr. Hollie Salk in Jan 2020.  -Started on bicarb -Lasix and metoprolol restarted at discharge  Nonsustained ventricular tachycardia Incidental, asymptomatic VT was noted several times during hospital stay.  Mag and K were replete.  On beta-blocker.   -Recommend outpatient echocardiogram  Hypertension  Renal cell cancer  S/P nephrectomy in 2019  Pancreatic neck mass The patient had a 1.1cm indeterminate pancreatic mass noted incidentally on MRI abdomen from 02/2018 in setting of RCC.  At that time was recommended to have a 3-6 month interval repeat noncon MRI, which patient did not get.    During this hospital stay, I obtained CT without contrast.  This has no obvious mass, although the 1.1cm mass from last July could easily have been missed on this modality.  There were no worrisome findings like dilated ducts or distortion of pancreatic architecture but Radiology and I recommend outpatient MRI abdomen without contrast in 3-6 months to follow up  Discharge Instructions  Discharge Instructions    Discharge instructions   Complete by:  As directed    From Dr. Loleta Books: You were admitted with super high blood sugar, from diabetes.  You also had a little bit of "DKA" which is a kind of diabetes problem that happens when you don't have enough insulin.  You were started on insulin, and your sugars got better and the DKA was fixed.  From now  on: Take a "basal bolus" insulin regimen: Take Lantus 36 units twice daily (for example at 9am and 9pm)  Take this all the time, every day Dr. Unk Lightning can adjust it for you. If you are REALLY sick and can't eat and are vomiting everything, you can reduce this to 20 units twice daily until you are able to eat again, but don't hold it completely   At mealtime, three times a day, take Humalog WITHIN 15 minutes of eating: With breakfast take 10 units plus your sliding scale With lunch take 12 units Humalog plus your sliding scale With dinner take 12 units Humalog plus your sliding scale   Sliding scale: Blood sugar < 70 mg/dL  --> don't take any Humalog, this is a low sugar, you should drink some orange juice and take your sugar again in 30 minutes Blood sugar 70-150 mg/dL: This is okay, just take your mealtime insulin alone Blood sugar 151 - 200 mg/dL: 4 extra units   (so take your mealtime insulin PLUS 4 extra units) Blood sugar 201 - 250 mg/dL: 7 extra units Blood sugar 251 - 300 mg/dL:  11 extra units Blood sugar 301 - 350 mg/dL: 15 extra units Blood sugar 351 - 400 mg/dL: 20 extra units Blood sugar >400 mg/dL: Take 20 units and call Dr. Unk Lightning or go to Urgent Care    Another important part of keeping your blood sugar normal will be physical activity: TAKE A WALK EVERY DAY!!  LOW BLOOD SUGAR: anything less than 70  GOOD BLOOD SUGAR: most sugars between 70 and 180  HIGH BLOOD SUGAR: anything over 180  REALLY HIGH BLOOD SUGAR: anything over 400    Watch for symptoms of hypoglycemia -- this would be sweats, feeling yucky, feeling nauseated, feeling jittery or tremors If you have these symptoms, check your blood sugar If your blood sugar is low (less than 70), the symptoms are from low blood sugar and you should drink some orange juice and check your sugar again If your sugar is normal or you aren;t sure, go to urgent care   Call Dr. Bishop [place] office to make sure you have an  appoitnment with them in the next 2-3 weeks. Get labs done at their office before your appointment Start the new medicine bicarbonate 650 mg twice daily for your kidneys This is to counteract acid that can't be removed by the kidneys   Restart your Lasix and potassium supplement in 2 days.   Also, have Dr. Unk Lightning order an echocardiogram for you, to follow up some abnormal heart rhythms that were seen in the hospital.    Two other small details: Reduce your dose of allopurinol to 100 mg daily Have Dr. Unk Lightning order an MRI of your pancreas in 3 to 6 months   Increase activity slowly   Complete by:  As directed      Allergies as of 12/22/2018      Reactions   Penicillins Rash, Other (See Comments)   Has patient had a PCN reaction causing immediate rash, facial/tongue/throat swelling, SOB  or lightheadedness with hypotension: yes Has patient had a PCN reaction causing severe rash involving mucus membranes or skin necrosis: no Has patient had a PCN reaction that required hospitalization: no Has patient had a PCN reaction occurring within the last 10 years: no If all of the above answers are "NO", then may proceed with Cephalosporin use.      Medication List    TAKE these medications   allopurinol 100 MG tablet Commonly known as:  ZYLOPRIM Take 1 tablet (100 mg total) by mouth every evening. What changed:    medication strength  how much to take   blood glucose meter kit and supplies Dispense based on patient and insurance preference. Use up to four times daily as directed. (FOR ICD-10 E10.9, E11.9).   diphenhydramine-acetaminophen 25-500 MG Tabs tablet Commonly known as:  TYLENOL PM Take 1 tablet by mouth at bedtime as needed (sleep).   famotidine 40 MG tablet Commonly known as:  PEPCID Take 40 mg by mouth daily.   fluticasone 50 MCG/ACT nasal spray Commonly known as:  FLONASE Place 2 sprays into both nostrils daily as needed for allergies or rhinitis.   furosemide  40 MG tablet Commonly known as:  LASIX Take 1 tablet by mouth daily.   insulin glargine 100 UNIT/ML injection Commonly known as:  LANTUS Inject 0.36 mLs (36 Units total) into the skin 2 (two) times daily.   insulin lispro 100 UNIT/ML cartridge Commonly known as:  HumaLOG Inject 0.1-0.3 mLs (10-30 Units total) into the skin 3 (three) times daily with meals. Per sliding scale.   loratadine 10 MG tablet Commonly known as:  CLARITIN Take 10 mg by mouth every evening.   metoprolol tartrate 25 MG tablet Commonly known as:  LOPRESSOR Take 1 tablet (25 mg total) by mouth 2 (two) times daily. What changed:    when to take this  reasons to take this   omeprazole 20 MG tablet Commonly known as:  PRILOSEC OTC Take 20 mg by mouth daily.   Potassium Citrate 15 MEQ (1620 MG) Tbcr Take 1 tablet by mouth every evening.   senna-docusate 8.6-50 MG tablet Commonly known as:  Senokot-S Take 1 tablet by mouth daily. What changed:    when to take this  reasons to take this   sodium bicarbonate 650 MG tablet Take 1 tablet (650 mg total) by mouth 2 (two) times daily.   tamsulosin 0.4 MG Caps capsule Commonly known as:  FLOMAX Take 0.4 mg by mouth every evening.      Follow-up Information    Myrlene Broker, MD Follow up.   Specialty:  Family Medicine Why:  Call for an appointment within the next few weeks, as soon as able Contact information: [place] [address] [phone]        Madelon Lips, MD Follow up.   Specialty:  Nephrology Why:  Call to confirm appointment and lab work in next 2 weeks Contact information: [place] [address] [phone]          Allergies  Allergen Reactions  . Penicillins Rash and Other (See Comments)    Has patient had a PCN reaction causing immediate rash, facial/tongue/throat swelling, SOB or lightheadedness with hypotension: yes Has patient had a PCN reaction causing severe rash involving  mucus membranes or skin necrosis: no Has patient had a PCN reaction that required hospitalization: no Has patient had a PCN reaction occurring within the last 10 years: no If all of  the above answers are "NO", then may proceed with Cephalosporin use.     Consultations:  Nephrology  Diabetes Education   Procedures/Studies: Ct Abdomen Pelvis Wo Contrast  Result Date: 12/20/2018 CLINICAL DATA:  Pancreatic pseudocyst. EXAM: CT ABDOMEN AND PELVIS WITHOUT CONTRAST TECHNIQUE: Multidetector CT imaging of the abdomen and pelvis was performed following the standard protocol without IV contrast. COMPARISON:  CT scan of September 05, 2018. FINDINGS: Lower chest: No acute abnormality. Hepatobiliary: No gallstones or biliary dilatation is noted. Hepatic steatosis is noted. Pancreas: Unremarkable. No pancreatic ductal dilatation or surrounding inflammatory changes. Spleen: Normal in size without focal abnormality. Adrenals/Urinary Tract: Adrenal glands appear normal. Status post right nephrectomy. Nonobstructive left nephrolithiasis is noted. Left renal cyst is noted which is unchanged. Mild left hydroureteronephrosis is noted without obstructive calculus. Urinary bladder is unremarkable. Stomach/Bowel: The stomach appears normal. There is no evidence of bowel obstruction or inflammation. The appendix is not clearly visualized. Diverticulosis of descending and sigmoid colon is noted without inflammation. Vascular/Lymphatic: Aortic atherosclerosis. No enlarged abdominal or pelvic lymph nodes. Reproductive: Prostate is unremarkable. Other: Small fat containing periumbilical hernia is noted. No ascites is noted. Musculoskeletal: No acute or significant osseous findings. IMPRESSION: Hepatic steatosis. Left nephrolithiasis. Mild left hydronephrosis is noted without evidence of obstructing calculus. Status post right nephrectomy. Diverticulosis of descending and sigmoid colon without inflammation. Aortic Atherosclerosis  (ICD10-I70.0). Electronically Signed   By: Marijo Conception M.D.   On: 12/20/2018 12:20   Dg Chest Port 1 View  Result Date: 12/19/2018 CLINICAL DATA:  Weakness EXAM: PORTABLE CHEST 1 VIEW COMPARISON:  06/12/2018 FINDINGS: Heart and mediastinal contours are within normal limits. No focal opacities or effusions. No acute bony abnormality. IMPRESSION: No active disease. Electronically Signed   By: Rolm Baptise M.D.   On: 12/19/2018 01:09       Subjective: No complaints.  No swelling.  No dry mouth or polyuria.  No jitters, sweats, malaise.  No vomiting, abdominal pain, itching.  No dyspnea, orthopnea.  Discharge Exam: Vitals:   12/22/18 0803 12/22/18 0811  BP: (!) 146/89   Pulse:  94  Resp:    Temp: 98.8 F (37.1 C)   SpO2:     Vitals:   12/22/18 0143 12/22/18 0526 12/22/18 0803 12/22/18 0811  BP: 132/71 138/78 (!) 146/89   Pulse: 83 79  94  Resp: (!) 22 (!) 23    Temp: 98.3 F (36.8 C) 98 F (36.7 C) 98.8 F (37.1 C)   TempSrc: Oral Oral Oral   SpO2: 100% 100%    Weight:      Height:        General: Pt is alert, awake, not in acute distress Cardiovascular: RRR, nl S1-S2, no murmurs appreciated.   No LE edema.   Respiratory: Normal respiratory rate and rhythm.  CTAB without rales or wheezes. Abdominal: Abdomen soft and non-tender.  No distension or HSM.   Neuro/Psych: Strength symmetric in upper and lower extremities.  Judgment and insight appear normal.   The results of significant diagnostics from this hospitalization (including imaging, microbiology, ancillary and laboratory) are listed below for reference.     Microbiology: No results found for this or any previous visit (from the past 240 hour(s)).   Labs: BNP (last 3 results) No results for input(s): BNP in the last 8760 hours. Basic Metabolic Panel: Recent Labs  Lab 12/19/18 0048  12/19/18 2209 12/20/18 0228 12/20/18 0600 12/21/18 0234 12/22/18 0325  NA 116*   < > 131* 134* 134*  134* 137  K 5.0   <  > 3.7 3.5 3.7 4.5 4.2  CL 85*   < > 103 107 104 108 112*  CO2 15*   < > 17* 16* 16* 16* 14*  GLUCOSE 929*   < > 223* 142* 180* 319* 221*  BUN 80*   < > 73* 73* 71* 67* 60*  CREATININE 4.86*   < > 4.48* 4.29* 4.36* 4.00* 3.51*  CALCIUM 8.3*   < > 8.2* 8.4* 8.4* 8.3* 8.6*  MG 1.7  --   --  1.8  --   --   --   PHOS 5.3*  --   --  5.1*  --   --   --    < > = values in this interval not displayed.   Liver Function Tests: Recent Labs  Lab 12/19/18 0048  AST 16  ALT 7  ALKPHOS 128*  BILITOT 0.5  PROT 7.5  ALBUMIN 3.0*   No results for input(s): LIPASE, AMYLASE in the last 168 hours. No results for input(s): AMMONIA in the last 168 hours. CBC: Recent Labs  Lab 12/19/18 0048 12/19/18 0055 12/20/18 0600 12/21/18 0234 12/22/18 0325  WBC 14.7*  --  11.9* 13.9* 11.6*  NEUTROABS 10.8*  --   --   --   --   HGB 11.7* 12.2* 11.3* 10.3* 10.3*  HCT 35.8* 36.0* 33.1* 30.9* 31.5*  MCV 85.2  --  82.1 83.5 84.0  PLT 278  --  267 255 257   Cardiac Enzymes: No results for input(s): CKTOTAL, CKMB, CKMBINDEX, TROPONINI in the last 168 hours. BNP: Invalid input(s): POCBNP CBG: Recent Labs  Lab 12/21/18 0922 12/21/18 1654 12/21/18 2134 12/22/18 0801 12/22/18 1209  GLUCAP 232* 238* 317* 262* 256*   D-Dimer No results for input(s): DDIMER in the last 72 hours. Hgb A1c No results for input(s): HGBA1C in the last 72 hours. Lipid Profile No results for input(s): CHOL, HDL, LDLCALC, TRIG, CHOLHDL, LDLDIRECT in the last 72 hours. Thyroid function studies No results for input(s): TSH, T4TOTAL, T3FREE, THYROIDAB in the last 72 hours.  Invalid input(s): FREET3 Anemia work up No results for input(s): VITAMINB12, FOLATE, FERRITIN, TIBC, IRON, RETICCTPCT in the last 72 hours. Urinalysis    Component Value Date/Time   COLORURINE STRAW (A) 12/19/2018 0305   APPEARANCEUR CLEAR 12/19/2018 0305   LABSPEC 1.014 12/19/2018 0305   PHURINE 6.0 12/19/2018 0305   GLUCOSEU >=500 (A) 12/19/2018 0305    HGBUR SMALL (A) 12/19/2018 0305   BILIRUBINUR NEGATIVE 12/19/2018 0305   KETONESUR NEGATIVE 12/19/2018 0305   PROTEINUR 30 (A) 12/19/2018 0305   NITRITE NEGATIVE 12/19/2018 0305   LEUKOCYTESUR SMALL (A) 12/19/2018 0305     Time coordinating discharge: 40 minutes      SIGNED:   Edwin Dada, MD  [facility] 12/22/2018, 4:57 PM

## 2018-12-22 NOTE — TOC Transition Note (Signed)
Transition of Care [facility]) - CM/SW Discharge Note   Patient Details  Name: Pearse Shiffler. MRN: [id number] Date of Birth: [birth year]  [facility] CM/SW Contact:  Carles Collet, RN Phone Number: 12/22/2018, 9:52 AM   Clinical Narrative:     DC to home, no CM needs identified.    Final next level of care: Home/Self Care Barriers to Discharge: Continued Medical Work up   Patient Goals and CMS Choice        Discharge Placement                       Discharge Plan and Services In-house Referral: NA Discharge Planning Services: CM Consult            DME Arranged: N/A DME Agency: NA       HH Arranged: NA HH Agency: NA        Social Determinants of Health (SDOH) Interventions     Readmission Risk Interventions Readmission Risk Prevention Plan 04/23/2018  Transportation Screening Complete  PCP or Specialist Appt within 3-5 Days Complete  Home Care Screening Complete  HRI or [place] Complete  Social Work Consult for [place] Planning/Counseling Complete  Palliative Care Screening Complete  Medication Review [employer]) Complete

## 2018-12-22 NOTE — Progress Notes (Signed)
George Lewis. to be D/C'd Home per MD order.  Discussed with the patient and all questions fully answered.  VSS, Skin clean, dry and intact without evidence of skin break down, no evidence of skin tears noted. IV catheter discontinued intact. Site without signs and symptoms of complications. Dressing and pressure applied.  An After Visit Summary was printed and given to the patient. Patient received prescription.  D/c education completed with patient/family including follow up instructions, medication list, d/c activities limitations if indicated, with other d/c instructions as indicated by MD - patient able to verbalize understanding, all questions fully answered.   Patient instructed to return to ED, call 911, or call MD for any changes in condition.   Patient escorted via [place], and D/C home via private auto.  Luci Bank 12/22/2018 4:59 PM

## 2018-12-22 NOTE — Progress Notes (Signed)
[name] Progress Note  Name: George Lewis. MRN: [id number] DOB: 05-01-1956  Chief Complaint:  Weakness   Subjective:  Feels good today.  Excited about going home.  His wife has been in touch with our office about setting up a follow-up.   Review of systems:  Denies shortness of breath or chest pain  Denies nausea or vomiting  ------------------ Background on consult:  George Lewis. is a 63 y.o. male with a history of obesity, renal cell carcinoma status post nephrectomy, CKD stage IV followed by our office with Dr. Hollie Salk who presented with weakness.  He was found on outpatient labs to have markedly elevated glucose and DKA.  He has been initiated on fluids and insulin.  Note that creatinine was 3.8 in 05/2018.  Creatinine was also elevated at 4.86 on admission.  It is improved to 4.47 on most recent BMP.  Note that he does have a fistula in place. He last saw Dr. Hollie Salk on 09/17/18 and creatinine was 3.4 at that time with concern for CKD progression.  He denies any shortness of breath or nausea.  He has been making good urine output per his report.   Intake/Output Summary (Last 24 hours) at 12/22/2018 1224 Last data filed at 12/22/2018 0503 Gross per 24 hour  Intake -  Output 1475 ml  Net -1475 ml    Vitals:  Vitals:   12/22/18 0143 12/22/18 0526 12/22/18 0803 12/22/18 0811  BP: 132/71 138/78 (!) 146/89   Pulse: 83 79  94  Resp: (!) 22 (!) 23    Temp: 98.3 F (36.8 C) 98 F (36.7 C) 98.8 F (37.1 C)   TempSrc: Oral Oral Oral   SpO2: 100% 100%    Weight:      Height:         Physical Exam:  General adult male in bed in no acute distress Heart regular rate and rhythm no rub  Lungs clear and unlabored Abdomen softly distended/obese/nontender Extremities no lower extremity edema  Skin no rash on extremities exposed Neuro alert and oriented x3 provides a history Access LUE AVF with bruit and thrill   Medications reviewed   Labs:  BMP  Latest Ref Rng & Units 12/22/2018 12/21/2018 12/20/2018  Glucose 70 - 99 mg/dL 221(H) 319(H) 180(H)  BUN 6 - 20 mg/dL 60(H) 67(H) 71(H)  Creatinine 0.61 - 1.24 mg/dL 3.51(H) 4.00(H) 4.36(H)  Sodium 135 - 145 mmol/L 137 134(L) 134(L)  Potassium 3.5 - 5.1 mmol/L 4.2 4.5 3.7  Chloride 98 - 111 mmol/L 112(H) 108 104  CO2 22 - 32 mmol/L 14(L) 16(L) 16(L)  Calcium 8.9 - 10.3 mg/dL 8.6(L) 8.3(L) 8.4(L)     Assessment/Plan:   # AKI  - Secondary to pre-renal insults with the DKA - Resolving with supportive care.  No acute indication for dialysis  - Note that he may have had some degree of CKD progression as well.   - I have requested hospital follow-up appt with Dr. Hollie Salk in 2-3 weeks through our office.  Asked for renal profile, CBC, and intact PTH prior to follow-up  # CKD stage IV  - Note most recent Cr 3.4 in 08/2018 - Reviewed last vascular note - fistula felt to be mature and ok for use when needed    # Metabolic acidosis - DKA resolved and with underlying CKD - started sodium bicarbonate and would increase to 650 mg TID.  Spoke with pt's wife who helps manage his medications and she  voiced understanding of this change.    # DKA - resolving - New diabetes diagnosis per patient report  - continue insulin/fluids per primary team   # HTN with CKD  - Acceptable   # Renal cell carcinoma - s/p nephrectomy  # Anemia secondary in part to CKD  - No acute indication for PRBC's or ESA  Stable without the current need for dialysis and ok for discharge from a renal standpoint.    Claudia Desanctis, MD 12/22/2018 12:24 PM

## 2018-12-22 NOTE — Progress Notes (Signed)
Pt with 10 beats of VTAC.   NSR on EKG

## 2018-12-25 ENCOUNTER — Telehealth: Payer: Self-pay | Admitting: *Deleted

## 2019-01-01 ENCOUNTER — Inpatient Hospital Stay: Payer: PRIVATE HEALTH INSURANCE | Admitting: Critical Care Medicine

## 2019-01-15 DIAGNOSIS — R5383 Other fatigue: Secondary | ICD-10-CM | POA: Insufficient documentation

## 2019-01-15 DIAGNOSIS — Z8679 Personal history of other diseases of the circulatory system: Secondary | ICD-10-CM | POA: Insufficient documentation

## 2019-01-19 DIAGNOSIS — N184 Chronic kidney disease, stage 4 (severe): Secondary | ICD-10-CM

## 2019-01-25 NOTE — Progress Notes (Signed)
Subjective:     Patient ID: George Mead., male   DOB: [birth year], 63 y.o.   MRN: [id number]  HPI:  George Lewis is here to establish as a new pt.   He is a pleasant 63 year old male. PMH: HTN, Obesity, OSA, Renal Cell Ca, S/P Nephrectomy 2019,CKD IV with fistula (left radiocephalic AV fistula on 02/27/946) , and new onset diabetes A1c >14 at last hospital admission April 2020. He has been using various urgent cares for primary care needs and frequent ED visits with subsequent hospital admissions. He is followed by Dr. Upton/Nephrology-advised to follow up with 2-3 weeks and have the following labs: CBC, BMP, Intact PTH Echocardiogram recommended for nonsustained asymptomatic VT noted incidentally on Tele in the hospital He was started on Lantus and Humolog Sliding Scale, provided detailed instructions on how to manage diabetes  Pancreatic neck mass The patient had a 1.1cm indeterminate pancreatic mass noted incidentally on MRI abdomen from 02/2018 in setting of RCC.   At that time was recommended to have a 3-6 month interval repeat noncon MRI- never completed CT WO completed during April 2020 hospitalization  IMPRESSION: Hepatic steatosis. Left nephrolithiasis. Mild left hydronephrosis is noted without evidence of obstructing calculus. Status post right nephrectomy. Diverticulosis of descending and sigmoid colon without inflammation.  Recommendation to repeat MRI abdomen WO in 3-6 months  Lab Results  Component Value Date   HGBA1C 14.9 (H) 12/19/2018   George Lewis has been seen by his Nephrologist/George Lewis, labs completed. He was seen by his previous PCP, labs completed, but due to lapse in time since last OV- he was told he would have to re-establish as a new pt. He reports medication compliance, denies SE He has not used Metoprolol tartrate 64m BID in months due to SBP 110, his Nephrologist instructed him to hold BB if SBP <130 He has been using Lantus and Humalog per  Dr. DSabino Niemannclear instructions- BG has been steadily trending down, with BG in 80s the last two weeks- will decrease injectable therapy. He reports following Diabetic diet, walking frequently, and even doing yard work- as tolerated. He reports vision change, specifically blurred vision. He states "it has been years since I have had my eyes looked at".  Recommend diabetic eye examination  Patient Care Team    Relationship Specialty Notifications Start End  DMina MarbleD, NP PCP - General Family Medicine  01/16/19   AElouise Munroe MD PCP - Cardiology Cardiology Admissions 06/13/18     Patient Active Problem List   Diagnosis Date Noted  . Healthcare maintenance 01/28/2019  . DKA (diabetic ketoacidoses) ([place] 12/19/2018  . Newly diagnosed diabetes ([facility] 12/19/2018  . Syncope 06/12/2018  . Ileus ([facility]   . SVT (supraventricular tachycardia) ([facility]   . Renal mass, right 05/16/2018  . Mass of pancreas   . Abnormal CT of the abdomen   . Bacteremia due to Enterococcus 04/23/2018  . Sepsis ([facility] 04/19/2018  . Acute renal failure superimposed on stage 4 chronic kidney disease ([facility] 04/19/2018  . Dehydration 04/19/2018  . UTI (urinary tract infection): Probable 04/19/2018  . Leukocytosis 04/19/2018  . Anemia 04/19/2018  . Renal cell carcinoma, right ([facility] 04/19/2018  . S/P ureteral stent placement: 04/16/2018 04/19/2018  . GERD (gastroesophageal reflux disease) 04/19/2018  . OSA on CPAP 04/19/2018  . HTN (hypertension) 04/19/2018     Past Medical History:  Diagnosis Date  . Arthritis    knees  . Bilateral renal cysts 03/23/2018   Noted  MRI ABD  . Cholelithiasis 03/19/2018   noted on CT AB/Pelvis  . CKD (chronic kidney disease), stage IV [facility])    nephrologist-- dr Hollie Lewis [name]);  05-01-2018 has not started dialysis, scheduled for AV fistula creation 05-07-2018  . Diverticulosis of colon 04/19/2018   Noted on CT abd/pelvis  . GERD (gastroesophageal reflux disease)    . Hepatic steatosis 03/19/2018   Mild diffuse, noted on CT AB/Pelvis  . History of kidney stones   . History of pulmonary embolus (PE) 03/2008   treated with coumadin for 6 months  . History of sepsis 04/20/2018   per discharge note , probable UTI  . Hypertension   . Nocturia   . OSA on CPAP   . Pancreatic lesion    0.4cm cystic per CT 07/ 2019  . Pneumonia   . Pulmonary nodule    Solid 24m Right Lower Lobe  . Right renal mass 04/10/2018   new dx--  scheduled for nephrectomy 05-16-2018  . S/P ureteral stent placement: 04/16/2018 04/19/2018     Past Surgical History:  Procedure Laterality Date  . AV FISTULA PLACEMENT Left 05/07/2018   Procedure: Creation of Left arm Radiocephalic Fistula;  Surgeon: CMarty Heck MD;  [facility] [place]  Service: Vascular;  Laterality: Left;  . CYSTOSCOPY/RETROGRADE/URETEROSCOPY/STONE EXTRACTION WITH BASKET  x2 last one 1990s approx.  . CYSTOSCOPY/URETEROSCOPY/HOLMIUM LASER/STENT PLACEMENT Left 04/16/2018   Procedure: CYSTOSCOPY/LEFT URETEROSCOPY/LEFT RETROGRADE/STENT PLACEMENT;  Surgeon: BLucas Mallow MD;  Location: [facility];  Service: Urology;  Laterality: Left;  . EUS N/A 05/03/2018   Procedure: UPPER ENDOSCOPIC ULTRASOUND (EUS) RADIAL;  Surgeon: JMilus Banister MD;  Location: [facility];  Service: Gastroenterology;  Laterality: N/A;  . EUS N/A 05/03/2018   Procedure: UPPER ENDOSCOPIC ULTRASOUND (EUS) LINEAR;  Surgeon: JMilus Banister MD;  Location: [facility];  Service: Gastroenterology;  Laterality: N/A;  . EXTRACORPOREAL SHOCK WAVE LITHOTRIPSY  x3  last one 2004 approx.  .Marland KitchenFINE NEEDLE ASPIRATION N/A 05/03/2018   Procedure: FINE NEEDLE ASPIRATION (FNA) LINEAR;  Surgeon: JMilus Banister MD;  Location: [facility];  Service: Gastroenterology;  Laterality: N/A;  . LAPAROSCOPIC NEPHRECTOMY, HAND ASSISTED Right 05/16/2018   Procedure: HAND ASSISTED LAPAROSCOPIC RIGHT NEPHRECTOMY;  Surgeon: BLucas Mallow MD;  Location: [facility];   Service: Urology;  Laterality: Right;  . left index finger attachment  1980s   3-4 surgeries  . TOE SURGERY  1980s   in beween 2nd and 3rd toes cyst removed     Family History  Problem Relation Age of Onset  . Alzheimer's disease Mother   . Alzheimer's disease Father   . Heart disease Father   . Heart attack Father   . Cancer - Other Sister      Social History   Substance and Sexual Activity  Drug Use Never     Social History   Substance and Sexual Activity  Alcohol Use Never  . Frequency: Never     Social History   Tobacco Use  Smoking Status Never Smoker  Smokeless Tobacco Never Used     Outpatient Encounter Medications as of 01/28/2019  Medication Sig  . allopurinol (ZYLOPRIM) 100 MG tablet Take 1 tablet (100 mg total) by mouth every evening.  . blood glucose meter kit and supplies Dispense based on patient and insurance preference. Use up to four times daily as directed. (FOR ICD-10 E10.9, E11.9).  .Marland Kitchendiphenhydramine-acetaminophen (TYLENOL PM) 25-500 MG TABS tablet Take 1 tablet by mouth at bedtime as  needed (sleep).  . fluticasone (FLONASE) 50 MCG/ACT nasal spray Place 2 sprays into both nostrils daily as needed for allergies or rhinitis.  Marland Kitchen insulin glargine (LANTUS) 100 UNIT/ML injection Inject 0.36 mLs (36 Units total) into the skin 2 (two) times daily.  . insulin lispro (HUMALOG) 100 UNIT/ML cartridge Inject 0.1-0.3 mLs (10-30 Units total) into the skin 3 (three) times daily with meals. Per sliding scale.  . loratadine (CLARITIN) 10 MG tablet Take 10 mg by mouth every evening.   . metoprolol tartrate (LOPRESSOR) 25 MG tablet Take 1 tablet (25 mg total) by mouth 2 (two) times daily. (Patient taking differently: Take 25 mg by mouth as needed. )  . omeprazole (PRILOSEC OTC) 20 MG tablet Take 20 mg by mouth daily.  . Potassium Citrate 15 MEQ (1620 MG) TBCR Take 1 tablet by mouth every evening.   . sodium bicarbonate 650 MG tablet Take 1 tablet (650 mg total) by  mouth 2 (two) times daily.  . tamsulosin (FLOMAX) 0.4 MG CAPS capsule Take 0.4 mg by mouth every evening.   . famotidine (PEPCID) 40 MG tablet Take 40 mg by mouth daily.  . furosemide (LASIX) 40 MG tablet Take 1 tablet by mouth daily.  Marland Kitchen senna-docusate (SENOKOT-S) 8.6-50 MG tablet Take 1 tablet by mouth daily. (Patient not taking: Reported on 01/28/2019)   No facility-administered encounter medications on file as of 01/28/2019.     Allergies: Penicillins  Body mass index is 40.48 kg/m.  Blood pressure 106/71, pulse 100, temperature 98.6 F (37 C), temperature source Oral, height 6' 1" (1.854 m), weight (!) 306 lb 12.8 oz (139.2 kg), SpO2 99 %.  Review of Systems  Constitutional: Positive for activity change, appetite change and fatigue. Negative for chills, diaphoresis, fever and unexpected weight change.  Eyes: Positive for visual disturbance.  Respiratory: Negative for cough, chest tightness, shortness of breath, wheezing and stridor.   Cardiovascular: Negative for chest pain, palpitations and leg swelling.  Gastrointestinal: Negative for abdominal distention, abdominal pain, blood in stool, constipation, diarrhea, nausea, rectal pain and vomiting.  Endocrine: Negative for cold intolerance, heat intolerance, polydipsia, polyphagia and polyuria.  Genitourinary: Positive for difficulty urinating. Negative for flank pain.  Musculoskeletal: Positive for arthralgias, gait problem and myalgias.  Skin: Negative for color change, pallor, rash and wound.  Neurological: Negative for dizziness and light-headedness.  Hematological: Does not bruise/bleed easily.  Psychiatric/Behavioral: Negative for agitation, behavioral problems, confusion, decreased concentration, dysphoric mood, hallucinations, self-injury, sleep disturbance and suicidal ideas. The patient is not nervous/anxious and is not hyperactive.        Objective:   Physical Exam Constitutional:      General: He is not in acute  distress.    Appearance: He is obese. He is not ill-appearing, toxic-appearing or diaphoretic.  Eyes:     Extraocular Movements: Extraocular movements intact.     Conjunctiva/sclera: Conjunctivae normal.     Pupils: Pupils are equal, round, and reactive to light.  Cardiovascular:     Rate and Rhythm: Normal rate.     Pulses: Normal pulses.     Heart sounds: Normal heart sounds. No murmur. No friction rub. No gallop.   Pulmonary:     Effort: Pulmonary effort is normal. No respiratory distress.     Breath sounds: Normal breath sounds. No stridor. No wheezing, rhonchi or rales.  Chest:     Chest wall: No tenderness.  Musculoskeletal:        General: Swelling present.     Right ankle: He  exhibits swelling.     Left ankle: He exhibits swelling.     Right lower leg: He exhibits swelling.     Left lower leg: He exhibits swelling.  Skin:    Capillary Refill: Capillary refill takes less than 2 seconds.  Neurological:     Mental Status: He is alert and oriented to person, place, and time.  Psychiatric:        Mood and Affect: Mood normal.        Behavior: Behavior normal.        Thought Content: Thought content normal.        Judgment: Judgment normal.     Plan:   1. Healthcare maintenance   2. Newly diagnosed diabetes ([place])   3. SVT (supraventricular tachycardia) ([place])   4. OSA on CPAP   5. Mass of pancreas   6. Essential hypertension   7. Acute renal failure superimposed on stage 4 chronic kidney disease, unspecified acute renal failure type [facility])     Healthcare maintenance We will request labs recently completed from your previous PCP and Nephrologist. Echocardiogram ordered. Continue all medications as directed. Since BG has steadily decreased, Lantus and Humalog dosages were reduced. Follow-up in one month, please come fasting so we can check your A1c (average of your blood sugar over 3 months) and your cholesterol panel. Please continue close follow-up with your various  specialists. Continue to hold your Metoprolol Tartrate (Lopressor) 54m twice daily if your systolic blood pressure is <130. Please schedule a diabetic eye examination ASAP. Continue to social distance and wear a mask in public.  Newly diagnosed diabetes ([place] Lab Results  Component Value Date   HGBA1C 14.9 (H) 12/19/2018  Referral to Endocrinology placed  Since your blood sugar has steadily decreased your Lantus and Humalog dosages have been reduced: Take Lantus 32 units twice daily (for example at 9am and 9pm)  If you are REALLY sick and can't eat and are vomiting everything, you can reduce this to 20 units twice daily until you are able to eat again, but don't hold it completely At mealtime, three times a day, take Humalog WITHIN 15 minutes of eating: With breakfast take 8 units plus your sliding scale With lunch take 10 units Humalog plus your sliding scale With dinner take 10 units Humalog plus your sliding scale  Sliding scale: Blood sugar < 70 mg/dL  --> don't take any Humalog, this is a low sugar, you should drink some orange juice and take your sugar again in 30 minutes Watch for symptoms of hypoglycemia -- this would be sweats, feeling yucky, feeling nauseated, feeling jittery or tremors  Blood sugar 70-150 mg/dL: This is okay, just take your mealtime insulin alone Blood sugar 151 - 200 mg/dL: 4 extra units   (so take your mealtime insulin PLUS 4 extra units) Blood sugar 201 - 250 mg/dL: 7 extra units Blood sugar 251 - 300 mg/dL:  11 extra units Blood sugar 301 - 350 mg/dL: 15 extra units Blood sugar 351 - 400 mg/dL: 20 extra units Blood sugar >400 mg/dL: Take 20 units and call [place] go to Urgent Care  Another important part of keeping your blood sugar normal will be physical activity: TAKE A WALK EVERY DAY!!  LOW BLOOD SUGAR: anything less than 70 GOOD BLOOD SUGAR: most sugars between 70 and 180 HIGH BLOOD SUGAR: anything over 180 REALLY HIGH BLOOD SUGAR: anything  over 400    SVT (supraventricular tachycardia) (HCC) Echocardiogram ordered  OSA on CPAP Continue  nightly CPAP Encouraged weight loss  Mass of pancreas Pancreatic neck mass The patient had a 1.1cm indeterminate pancreatic mass noted incidentally on MRI abdomen from 02/2018 in setting of RCC.   At that time was recommended to have a 3-6 month interval repeat noncon MRI- never completed CT WO completed during April 2020 hospitalization  IMPRESSION: Hepatic steatosis. Left nephrolithiasis. Mild left hydronephrosis is noted without evidence of obstructing calculus. Status post right nephrectomy. Diverticulosis of descending and sigmoid colon without inflammation.  Recommendation to repeat MRI abdomen WO in 3-6 months  HTN (hypertension) He has not used Metoprolol tartrate 85m BID in months due to SBP consistently 110, his Nephrologist instructed him to hold BB if SBP <130  Acute renal failure superimposed on stage 4 chronic kidney disease (HCC)  Renal Cell Ca, S/P Nephrectomy 2019,CKD IV with fistula (left radiocephalic AV fistula on 99/02/262 . He is followed by Dr. UBolivar Haw4/25/2020 GFR 18    FOLLOW-UP:  Return in about 4 weeks (around 02/25/2019) for Regular Follow Up, Obesity, Fasting Labs, Diabetes.

## 2019-01-28 ENCOUNTER — Ambulatory Visit: Payer: PRIVATE HEALTH INSURANCE | Admitting: Adult Health

## 2019-01-28 ENCOUNTER — Other Ambulatory Visit: Payer: Self-pay

## 2019-01-28 ENCOUNTER — Encounter: Payer: Self-pay | Admitting: Adult Health

## 2019-01-28 VITALS — BP 106/71 | HR 100 | Temp 98.6°F | Ht 73.0 in | Wt 306.8 lb

## 2019-01-28 DIAGNOSIS — G4733 Obstructive sleep apnea (adult) (pediatric): Secondary | ICD-10-CM | POA: Diagnosis not present

## 2019-01-28 DIAGNOSIS — I471 Supraventricular tachycardia: Secondary | ICD-10-CM | POA: Diagnosis not present

## 2019-01-28 DIAGNOSIS — Z0000 Encounter for general adult medical examination without abnormal findings: Secondary | ICD-10-CM

## 2019-01-28 DIAGNOSIS — K8689 Other specified diseases of pancreas: Secondary | ICD-10-CM

## 2019-01-28 DIAGNOSIS — N179 Acute kidney failure, unspecified: Secondary | ICD-10-CM

## 2019-01-28 DIAGNOSIS — Z7189 Other specified counseling: Secondary | ICD-10-CM | POA: Insufficient documentation

## 2019-01-28 DIAGNOSIS — E119 Type 2 diabetes mellitus without complications: Secondary | ICD-10-CM | POA: Diagnosis not present

## 2019-01-28 DIAGNOSIS — N184 Chronic kidney disease, stage 4 (severe): Secondary | ICD-10-CM

## 2019-01-28 DIAGNOSIS — Z9989 Dependence on other enabling machines and devices: Secondary | ICD-10-CM

## 2019-01-28 DIAGNOSIS — I10 Essential (primary) hypertension: Secondary | ICD-10-CM

## 2019-01-28 NOTE — Assessment & Plan Note (Signed)
Pancreatic neck mass The patient had a 1.1cm indeterminate pancreatic mass noted incidentally on MRI abdomen from 02/2018 in setting of RCC.   At that time was recommended to have a 3-6 month interval repeat noncon MRI- never completed CT WO completed during April 2020 hospitalization  IMPRESSION: Hepatic steatosis. Left nephrolithiasis. Mild left hydronephrosis is noted without evidence of obstructing calculus. Status post right nephrectomy. Diverticulosis of descending and sigmoid colon without inflammation.  Recommendation to repeat MRI abdomen WO in 3-6 months

## 2019-01-28 NOTE — Assessment & Plan Note (Addendum)
He has not used Metoprolol tartrate 25mg  BID in months due to SBP consistently 110, his Nephrologist instructed him to hold BB if SBP <130

## 2019-01-28 NOTE — Assessment & Plan Note (Addendum)
Lab Results  Component Value Date   HGBA1C 14.9 (H) 12/19/2018  Referral to Endocrinology placed  Since your blood sugar has steadily decreased your Lantus and Humalog dosages have been reduced: Take Lantus 32 units twice daily (for example at 9am and 9pm)  If you are REALLY sick and can't eat and are vomiting everything, you can reduce this to 20 units twice daily until you are able to eat again, but don't hold it completely At mealtime, three times a day, take Humalog WITHIN 15 minutes of eating: With breakfast take 8 units plus your sliding scale With lunch take 10 units Humalog plus your sliding scale With dinner take 10 units Humalog plus your sliding scale  Sliding scale: Blood sugar < 70 mg/dL  --> don't take any Humalog, this is a low sugar, you should drink some orange juice and take your sugar again in 30 minutes Watch for symptoms of hypoglycemia -- this would be sweats, feeling yucky, feeling nauseated, feeling jittery or tremors  Blood sugar 70-150 mg/dL: This is okay, just take your mealtime insulin alone Blood sugar 151 - 200 mg/dL: 4 extra units   (so take your mealtime insulin PLUS 4 extra units) Blood sugar 201 - 250 mg/dL: 7 extra units Blood sugar 251 - 300 mg/dL:  11 extra units Blood sugar 301 - 350 mg/dL: 15 extra units Blood sugar 351 - 400 mg/dL: 20 extra units Blood sugar >400 mg/dL: Take 20 units and call us or go to Urgent Care  Another important part of keeping your blood sugar normal will be physical activity: TAKE A WALK EVERY DAY!!  LOW BLOOD SUGAR: anything less than 70 GOOD BLOOD SUGAR: most sugars between 70 and 180 HIGH BLOOD SUGAR: anything over 180 REALLY HIGH BLOOD SUGAR: anything over 400

## 2019-01-28 NOTE — Assessment & Plan Note (Signed)
Echocardiogram ordered.

## 2019-01-28 NOTE — Assessment & Plan Note (Addendum)
We will request labs recently completed from your previous PCP and Nephrologist. Echocardiogram ordered. Continue all medications as directed. Since BG has steadily decreased, Lantus and Humalog dosages were reduced. Follow-up in one month, please come fasting so we can check your A1c (average of your blood sugar over 3 months) and your cholesterol panel. Please continue close follow-up with your various specialists. Continue to hold your Metoprolol Tartrate (Lopressor) 25mg  twice daily if your systolic blood pressure is <130. Please schedule a diabetic eye examination ASAP. Continue to social distance and wear a mask in public.

## 2019-01-28 NOTE — Assessment & Plan Note (Signed)
Renal Cell Ca, S/P Nephrectomy 2019,CKD IV with fistula (left radiocephalic AV fistula on 03/06/4326) . He is followed by Dr. Bolivar Haw 12/22/2018 GFR 18

## 2019-01-28 NOTE — Assessment & Plan Note (Signed)
Continue nightly CPAP Encouraged weight loss

## 2019-01-28 NOTE — Patient Instructions (Addendum)
Diabetes Mellitus and Nutrition, Adult When you have diabetes (diabetes mellitus), it is very important to have healthy eating habits because your blood sugar (glucose) levels are greatly affected by what you eat and drink. Eating healthy foods in the appropriate amounts, at about the same times every day, can help you:  Control your blood glucose.  Lower your risk of heart disease.  Improve your blood pressure.  Reach or maintain a healthy weight. Every person with diabetes is different, and each person has different needs for a meal plan. Your health care provider may recommend that you work with a diet and nutrition specialist (dietitian) to make a meal plan that is best for you. Your meal plan may vary depending on factors such as:  The calories you need.  The medicines you take.  Your weight.  Your blood glucose, blood pressure, and cholesterol levels.  Your activity level.  Other health conditions you have, such as heart or kidney disease. How do carbohydrates affect me? Carbohydrates, also called carbs, affect your blood glucose level more than any other type of food. Eating carbs naturally raises the amount of glucose in your blood. Carb counting is a method for keeping track of how many carbs you eat. Counting carbs is important to keep your blood glucose at a healthy level, especially if you use insulin or take certain oral diabetes medicines. It is important to know how many carbs you can safely have in each meal. This is different for every person. Your dietitian can help you calculate how many carbs you should have at each meal and for each snack. Foods that contain carbs include:  Bread, cereal, rice, pasta, and crackers.  Potatoes and corn.  Peas, beans, and lentils.  Milk and yogurt.  Fruit and juice.  Desserts, such as cakes, cookies, ice cream, and candy. How does alcohol affect me? Alcohol can cause a sudden decrease in blood glucose (hypoglycemia),  especially if you use insulin or take certain oral diabetes medicines. Hypoglycemia can be a life-threatening condition. Symptoms of hypoglycemia (sleepiness, dizziness, and confusion) are similar to symptoms of having too much alcohol. If your health care provider says that alcohol is safe for you, follow these guidelines:  Limit alcohol intake to no more than 1 drink per day for nonpregnant women and 2 drinks per day for men. One drink equals 12 oz of beer, 5 oz of wine, or 1 oz of hard liquor.  Do not drink on an empty stomach.  Keep yourself hydrated with water, diet soda, or unsweetened iced tea.  Keep in mind that regular soda, juice, and other mixers may contain a lot of sugar and must be counted as carbs. What are tips for following this plan?  Reading food labels  Start by checking the serving size on the "Nutrition Facts" label of packaged foods and drinks. The amount of calories, carbs, fats, and other nutrients listed on the label is based on one serving of the item. Many items contain more than one serving per package.  Check the total grams (g) of carbs in one serving. You can calculate the number of servings of carbs in one serving by dividing the total carbs by 15. For example, if a food has 30 g of total carbs, it would be equal to 2 servings of carbs.  Check the number of grams (g) of saturated and trans fats in one serving. Choose foods that have low or no amount of these fats.  Check the number of  milligrams (mg) of salt (sodium) in one serving. Most people should limit total sodium intake to less than 2,300 mg per day.  Always check the nutrition information of foods labeled as "low-fat" or "nonfat". These foods may be higher in added sugar or refined carbs and should be avoided.  Talk to your dietitian to identify your daily goals for nutrients listed on the label. Shopping  Avoid buying canned, premade, or processed foods. These foods tend to be high in fat, sodium,  and added sugar.  Shop around the outside edge of the grocery store. This includes fresh fruits and vegetables, bulk grains, fresh meats, and fresh dairy. Cooking  Use low-heat cooking methods, such as baking, instead of high-heat cooking methods like deep frying.  Cook using healthy oils, such as olive, canola, or sunflower oil.  Avoid cooking with butter, cream, or high-fat meats. Meal planning  Eat meals and snacks regularly, preferably at the same times every day. Avoid going long periods of time without eating.  Eat foods high in fiber, such as fresh fruits, vegetables, beans, and whole grains. Talk to your dietitian about how many servings of carbs you can eat at each meal.  Eat 4-6 ounces (oz) of lean protein each day, such as lean meat, chicken, fish, eggs, or tofu. One oz of lean protein is equal to: ? 1 oz of meat, chicken, or fish. ? 1 egg. ?  cup of tofu.  Eat some foods each day that contain healthy fats, such as avocado, nuts, seeds, and fish. Lifestyle  Check your blood glucose regularly.  Exercise regularly as told by your health care provider. This may include: ? 150 minutes of moderate-intensity or vigorous-intensity exercise each week. This could be brisk walking, biking, or water aerobics. ? Stretching and doing strength exercises, such as yoga or weightlifting, at least 2 times a week.  Take medicines as told by your health care provider.  Do not use any products that contain nicotine or tobacco, such as cigarettes and e-cigarettes. If you need help quitting, ask your health care provider.  Work with a Social worker or diabetes educator to identify strategies to manage stress and any emotional and social challenges. Questions to ask a health care provider  Do I need to meet with a diabetes educator?  Do I need to meet with a dietitian?  What number can I call if I have questions?  When are the best times to check my blood glucose? Where to find more  information:  American Diabetes Association: diabetes.org  Academy of Nutrition and Dietetics: www.eatright.[employer] of Diabetes and Digestive and Kidney Diseases (NIH): [employer] Summary  A healthy meal plan will help you control your blood glucose and maintain a healthy lifestyle.  Working with a diet and nutrition specialist (dietitian) can help you make a meal plan that is best for you.  Keep in mind that carbohydrates (carbs) and alcohol have immediate effects on your blood glucose levels. It is important to count carbs and to use alcohol carefully. This information is not intended to replace advice given to you by your health care provider. Make sure you discuss any questions you have with your health care provider. Document Released: 05/12/2005 Document Revised: 03/15/2017 Document Reviewed: 09/19/2016 Elsevier Interactive Patient Education  2019 [employer].   We will request labs recently completed from your previous PCP and Nephrologist. Echocardiogram ordered. Since your blood sugar has steadily decreased your Lantus and Humalog dosages have been reduced: Take Lantus 32 units  twice daily (for example at 9am and 9pm)  If you are REALLY sick and can't eat and are vomiting everything, you can reduce this to 20 units twice daily until you are able to eat again, but don't hold it completely At mealtime, three times a day, take Humalog WITHIN 15 minutes of eating: With breakfast take 8 units plus your sliding scale With lunch take 10 units Humalog plus your sliding scale With dinner take 10 units Humalog plus your sliding scale  Sliding scale: Blood sugar < 70 mg/dL  --> don't take any Humalog, this is a low sugar, you should drink some orange juice and take your sugar again in 30 minutes Watch for symptoms of hypoglycemia -- this would be sweats, feeling yucky, feeling nauseated, feeling jittery or tremors  Blood sugar 70-150 mg/dL: This is okay,  just take your mealtime insulin alone Blood sugar 151 - 200 mg/dL: 4 extra units   (so take your mealtime insulin PLUS 4 extra units) Blood sugar 201 - 250 mg/dL: 7 extra units Blood sugar 251 - 300 mg/dL:  11 extra units Blood sugar 301 - 350 mg/dL: 15 extra units Blood sugar 351 - 400 mg/dL: 20 extra units Blood sugar >400 mg/dL: Take 20 units and call us or go to Urgent Care  Another important part of keeping your blood sugar normal will be physical activity: TAKE A WALK EVERY DAY!!  LOW BLOOD SUGAR: anything less than 70 GOOD BLOOD SUGAR: most sugars between 70 and 180 HIGH BLOOD SUGAR: anything over 180 REALLY HIGH BLOOD SUGAR: anything over 400   Follow-up in one month, please come fasting so we can check your A1c (average of your blood sugar over 3 months) and your cholesterol panel. Please continue close follow-up with your various specialists. Continue to hold your Metoprolol Tartrate (Lopressor) 25mg  twice daily if your systolic blood pressure is <130. Please schedule a diabetic eye examination ASAP. Continue to social distance and wear a mask in public. WELCOME TO THE PRACTICE!

## 2019-02-07 ENCOUNTER — Other Ambulatory Visit: Payer: Self-pay

## 2019-02-11 ENCOUNTER — Other Ambulatory Visit: Payer: Self-pay

## 2019-02-11 ENCOUNTER — Ambulatory Visit: Payer: PRIVATE HEALTH INSURANCE | Admitting: Internal Medicine

## 2019-02-11 ENCOUNTER — Encounter: Payer: Self-pay | Admitting: Internal Medicine

## 2019-02-11 VITALS — BP 126/70 | HR 88 | Ht 73.0 in | Wt 306.0 lb

## 2019-02-11 DIAGNOSIS — E119 Type 2 diabetes mellitus without complications: Secondary | ICD-10-CM | POA: Diagnosis not present

## 2019-02-11 LAB — POCT GLYCOSYLATED HEMOGLOBIN (HGB A1C): Hemoglobin A1C: 7.4 % — AB (ref 4.0–5.6)

## 2019-02-11 MED ORDER — INSULIN GLARGINE 100 UNIT/ML ~~LOC~~ SOLN: 32.0000 [IU] | Freq: Every day | SUBCUTANEOUS | 11 refills | Status: DC

## 2019-02-11 MED ORDER — HUMALOG 100 UNIT/ML ~~LOC~~ SOCT: 6.0000 [IU] | Freq: Three times a day (TID) | SUBCUTANEOUS | 11 refills | Status: DC

## 2019-02-11 MED ORDER — FREESTYLE LIBRE 14 DAY READER DEVI: 1.0000 | Freq: Once | 1 refills | Status: AC

## 2019-02-11 MED ORDER — FREESTYLE LIBRE 14 DAY SENSOR MISC: 1.0000 | 11 refills | Status: DC

## 2019-02-11 NOTE — Progress Notes (Signed)
Patient ID: Westly Hinnant., male   DOB: [birth year], 63 y.o.   MRN: [id number]   HPI: Javarri Segal. is a 63 y.o.-year-old male, referred by his PCP, [place], Berna Spare, NP, for management of DM2, dx in 11/2018, insulin-dependent, uncontrolled, without long-term complications.  He was dx with DM 2 mo ago >> lost 30 lbs since 10/2018.   He was admitted at that time with a glucose in the 900s, hydrated, and started on insulin.  He was discharged on insulin but his sugars improved and he even developed hypoglycemia.  His insulin doses were decreased approximately 1 week ago but he continues to have low blood sugars and to feel dizzy and tired and has blurry vision.  Reviewed latest HbA1c level: Lab Results  Component Value Date   HGBA1C 14.9 (H) 12/19/2018   Pt is on a regimen of: - Metformin 1000 mg 2x a day, with meals - Lantus 36 >> 32 units 2x a day - decreased 1 week ago - Humalog 10-30 >> 8-10 units 3x a day, before meals  Pt checks his sugars 3x a day and they are: - am: 85-115 - 2h after b'fast: n/c - before lunch: 68-96, 212 - 2h after lunch: 52, 58, 76-135 - before dinner: 59-133 - 2h after dinner: 67-144 - bedtime: n/c - nighttime: n/c Lowest sugar was 52; he has hypoglycemia awareness at 70.  Highest sugar was 926.  Glucometer: Accu-Chek Guide  Pt's meals are: - Breakfast: egg x1, [place] sausage or liver pudding, toast - Lunch: grilled chicken + salad, pinneapple - Dinner: grilled chicken or fish, hamburger, salad or veggies - Snacks: granola bar, fruit  - + CKD stage 4, s/p R nephrectomy 2019 for RCC - sees Dr. Hollie Salk  last BUN/creatinine:  Lab Results  Component Value Date   BUN 60 (H) 12/22/2018   BUN 67 (H) 12/21/2018   CREATININE 3.51 (H) 12/22/2018   CREATININE 4.00 (H) 12/21/2018   - last set of lipids: No results found for: CHOL, HDL, LDLCALC, LDLDIRECT, TRIG, CHOLHDL  - last eye exam was 3-4 years ago, no DR.  - no numbness and tingling in  his feet.  Pt has no FH of DM.  He also has hepatic steatosis.  ROS: Constitutional: no weight gain, no weight loss, + fatigue, no subjective hyperthermia, no subjective hypothermia, no nocturia Eyes: + blurry vision, no xerophthalmia ENT: no sore throat, no nodules palpated in neck, no dysphagia, no odynophagia, no hoarseness, + tinnitus, no hypoacusis Cardiovascular: no CP, no SOB, no palpitations, + leg swelling Respiratory: no cough, no SOB, no wheezing Gastrointestinal: no N, no V, + D (IBS), no C, + acid reflux Musculoskeletal: no muscle, no joint aches Skin: no rash, no hair loss Neurological: no tremors, no numbness or tingling/+ dizziness/no HAs Psychiatric: no depression, no anxiety  Past Medical History:  Diagnosis Date  . Arthritis    knees  . Bilateral renal cysts 03/23/2018   Noted MRI ABD  . Cholelithiasis 03/19/2018   noted on CT AB/Pelvis  . CKD (chronic kidney disease), stage IV [facility])    nephrologist-- dr Hollie Salk [name]);  05-01-2018 has not started dialysis, scheduled for AV fistula creation 05-07-2018  . Diverticulosis of colon 04/19/2018   Noted on CT abd/pelvis  . GERD (gastroesophageal reflux disease)   . Hepatic steatosis 03/19/2018   Mild diffuse, noted on CT AB/Pelvis  . History of kidney stones   . History of pulmonary embolus (PE) 03/2008  treated with coumadin for 6 months  . History of sepsis 04/20/2018   per discharge note , probable UTI  . Hypertension   . Nocturia   . OSA on CPAP   . Pancreatic lesion    0.4cm cystic per CT 07/ 2019  . Pneumonia   . Pulmonary nodule    Solid 85m Right Lower Lobe  . Right renal mass 04/10/2018   new dx--  scheduled for nephrectomy 05-16-2018  . S/P ureteral stent placement: 04/16/2018 04/19/2018   Past Surgical History:  Procedure Laterality Date  . AV FISTULA PLACEMENT Left 05/07/2018   Procedure: Creation of Left arm Radiocephalic Fistula;  Surgeon: CMarty Heck MD;  Location: [facility]   Service: Vascular;  Laterality: Left;  . CYSTOSCOPY/RETROGRADE/URETEROSCOPY/STONE EXTRACTION WITH BASKET  x2 last one 1990s approx.  . CYSTOSCOPY/URETEROSCOPY/HOLMIUM LASER/STENT PLACEMENT Left 04/16/2018   Procedure: CYSTOSCOPY/LEFT URETEROSCOPY/LEFT RETROGRADE/STENT PLACEMENT;  Surgeon: BLucas Mallow MD;  Location: [facility];  Service: Urology;  Laterality: Left;  . EUS N/A 05/03/2018   Procedure: UPPER ENDOSCOPIC ULTRASOUND (EUS) RADIAL;  Surgeon: JMilus Banister MD;  Location: [facility];  Service: Gastroenterology;  Laterality: N/A;  . EUS N/A 05/03/2018   Procedure: UPPER ENDOSCOPIC ULTRASOUND (EUS) LINEAR;  Surgeon: JMilus Banister MD;  Location: [facility];  Service: Gastroenterology;  Laterality: N/A;  . EXTRACORPOREAL SHOCK WAVE LITHOTRIPSY  x3  last one 2004 approx.  .Marland KitchenFINE NEEDLE ASPIRATION N/A 05/03/2018   Procedure: FINE NEEDLE ASPIRATION (FNA) LINEAR;  Surgeon: JMilus Banister MD;  Location: [facility];  Service: Gastroenterology;  Laterality: N/A;  . LAPAROSCOPIC NEPHRECTOMY, HAND ASSISTED Right 05/16/2018   Procedure: HAND ASSISTED LAPAROSCOPIC RIGHT NEPHRECTOMY;  Surgeon: BLucas Mallow MD;  Location: [facility];  Service: Urology;  Laterality: Right;  . left index finger attachment  1980s   3-4 surgeries  . TOE SURGERY  1980s   in beween 2nd and 3rd toes cyst removed   Social History   Socioeconomic History  . Marital status: Married    Spouse name: Not on file  . Number of children: Not on file  . Years of education: Not on file  . Highest education level: Not on file  Occupational History  . Not on file  Social Needs  . Financial resource strain: Patient refused  . Food insecurity    Worry: Never true    Inability: Never true  . Transportation needs    Medical: No    Non-medical: No  Tobacco Use  . Smoking status: Never Smoker  . Smokeless tobacco: Never Used  Substance and Sexual Activity  . Alcohol use: Never    Frequency: Never  . Drug use:  Never  . Sexual activity: Not on file  Lifestyle  . Physical activity    Days per week: 0 days    Minutes per session: 0 min  . Stress: Not on file  Relationships  . Social connections    Talks on phone: More than three times a week    Gets together: Never    Attends religious service: More than 4 times per year    Active member of club or organization: No    Attends meetings of clubs or organizations: Never    Relationship status: Married  . Intimate partner violence    Fear of current or ex partner: Not on file    Emotionally abused: Not on file    Physically abused: Not on file    Forced sexual activity: Not  on file  Other Topics Concern  . Not on file  Social History Narrative  . Not on file   Medications: - Metformin 1000 mg 2x a day, with meals - Lantus 32 units 2x a day  - Humalog 8-10 units 3x a day, before meals  Also: Current Outpatient Medications on File Prior to Visit  Medication Sig Dispense Refill  . allopurinol (ZYLOPRIM) 100 MG tablet Take 1 tablet (100 mg total) by mouth every evening. 30 tablet 0  . blood glucose meter kit and supplies Dispense based on patient and insurance preference. Use up to four times daily as directed. (FOR ICD-10 E10.9, E11.9). 1 each 0  . diphenhydramine-acetaminophen (TYLENOL PM) 25-500 MG TABS tablet Take 1 tablet by mouth at bedtime as needed (sleep).    . famotidine (PEPCID) 40 MG tablet Take 40 mg by mouth daily.    . fluticasone (FLONASE) 50 MCG/ACT nasal spray Place 2 sprays into both nostrils daily as needed for allergies or rhinitis.    . furosemide (LASIX) 40 MG tablet Take 1 tablet by mouth daily.    Marland Kitchen loratadine (CLARITIN) 10 MG tablet Take 10 mg by mouth every evening.     . metoprolol tartrate (LOPRESSOR) 25 MG tablet Take 1 tablet (25 mg total) by mouth 2 (two) times daily. (Patient taking differently: Take 25 mg by mouth as needed. ) 60 tablet 2  . omeprazole (PRILOSEC OTC) 20 MG tablet Take 20 mg by mouth daily.     . Potassium Citrate 15 MEQ (1620 MG) TBCR Take 1 tablet by mouth every evening.   3  . senna-docusate (SENOKOT-S) 8.6-50 MG tablet Take 1 tablet by mouth daily. 30 tablet 1  . sodium bicarbonate 650 MG tablet Take 1 tablet (650 mg total) by mouth 2 (two) times daily. 60 tablet 2  . tamsulosin (FLOMAX) 0.4 MG CAPS capsule Take 0.4 mg by mouth every evening.   3   No current facility-administered medications on file prior to visit.    Allergies  Allergen Reactions  . Penicillins Rash and Other (See Comments)    Has patient had a PCN reaction causing immediate rash, facial/tongue/throat swelling, SOB or lightheadedness with hypotension: yes Has patient had a PCN reaction causing severe rash involving mucus membranes or skin necrosis: no Has patient had a PCN reaction that required hospitalization: no Has patient had a PCN reaction occurring within the last 10 years: no If all of the above answers are "NO", then may proceed with Cephalosporin use.    Family History  Problem Relation Age of Onset  . Alzheimer's disease Mother   . Alzheimer's disease Father   . Heart disease Father   . Heart attack Father   . Cancer - Other Sister    PE: BP 126/70   Pulse 88   Ht '6\' 1"'$  (1.854 m)   Wt (!) 306 lb (138.8 kg)   SpO2 98%   BMI 40.37 kg/m  Wt Readings from Last 3 Encounters:  02/11/19 (!) 306 lb (138.8 kg)  01/28/19 (!) 306 lb 12.8 oz (139.2 kg)  12/20/18 (!) 300 lb 14.9 oz (136.5 kg)   Constitutional: obese, in NAD Eyes: PERRLA, EOMI, no exophthalmos ENT: moist mucous membranes, no thyromegaly, no cervical lymphadenopathy Cardiovascular: RRR, No MRG Respiratory: CTA B Gastrointestinal: abdomen soft, NT, ND, BS+ Musculoskeletal: no deformities, strength intact in all 4 Skin: moist, warm, no rashes Neurological: no tremor with outstretched hands, DTR normal in all 4  ASSESSMENT: 1. DM2, insulin-dependent, uncontrolled,  without long-term complications  PLAN:  1. Patient with   newly diagnosed diabetes, on metformin + basal-bolus insulin regimen.  Patient's blood sugars are very high 900s, but they decrease significantly on insulin.  He is actually now feeling weak, dizzy, tired and has paresthesias every time sugars are dropping under 70s.  His insulin doses were decreased a week ago by PCP, but he continues to have low blood sugars and feeling poorly.   - HbA1c today 7.4% (improved from 14.9% 1.5 months ago!) - He tells me that he and his wife improved their diet and it appears that they are doing a great job checking blood sugars 3 times a day and documenting them in the log.  He also has comments about how many units of rapid acting insulin he is injecting before each meal.   -At this visit, we discussed about his new diagnosis of diabetes, possible complications, bolus short and long-term and also ways to improve his diet further.  I also referred him to nutrition + diabetes education.  I encouraged him to participate along with his wife. -Regarding his insulin doses, he is still taking much higher doses than needed based on his sugars >> we decrease the dose of his long-acting insulin by 50% and also decreased his NovoLog doses further.  At next visit, if he is doing well on the above regimen I plan to stop NovoLog and switch him to a GLP-1 receptor agonist.  We do not have a lot of treatment options for him due to his advanced CKD. -However, for now, the priority is to decrease his insulin dose and we also discussed about ways to increase his sugars in case it drops in the hypoglycemic range. -I encouraged him to obtain the freestyle libre CGM and sent a prescription to his pharmacy.  I am not sure if this is covered for him, though - I suggested to:  Patient Instructions  Please continue: - Metformin 1000 mg 2x a day, with meals  Please decrease: - Lantus 32 units once a day at bedtime - Novolog 6-8 units 3x a day, before meals  Stop granola.  Please let me know  if the sugars are consistently <80 or >200.  Please return in 2-3 months with your sugar log.   -Continue checking sugars at different times of the day - check 2-3x a day, rotating checks - discussed about CBG targets for treatment: 80-130 mg/dL before meals and <180 mg/dL after meals; target HbA1c <7%. - given sugar log and advised how to fill it and to bring it at next appt  - given foot care handout and explained the principles  - given instructions for hypoglycemia management "15-15 rule"  - advised for yearly eye exams  - Return to clinic in 3 mo with sugar log   Philemon Kingdom, MD PhD [facility] Endocrinology

## 2019-02-11 NOTE — Patient Instructions (Addendum)
Please continue: - Metformin 1000 mg 2x a day, with meals  Please decrease: - Lantus 32 units once a day at bedtime - Novolog 6-8 units 3x a day, before meals  Stop granola.  Please let me know if the sugars are consistently <80 or >200.  Please return in 2-3 months with your sugar log.   PATIENT INSTRUCTIONS FOR TYPE 2 DIABETES:  **Please join MyChart!** - see attached instructions about how to join if you have not done so already.  DIET AND EXERCISE Diet and exercise is an important part of diabetic treatment.  We recommended aerobic exercise in the form of brisk walking (working between 40-60% of maximal aerobic capacity, similar to brisk walking) for 150 minutes per week (such as 30 minutes five days per week) along with 3 times per week performing 'resistance' training (using various gauge rubber tubes with handles) 5-10 exercises involving the major muscle groups (upper body, lower body and core) performing 10-15 repetitions (or near fatigue) each exercise. Start at half the above goal but build slowly to reach the above goals. If limited by weight, joint pain, or disability, we recommend daily walking in a swimming pool with water up to waist to reduce pressure from joints while allow for adequate exercise.    BLOOD GLUCOSES Monitoring your blood glucoses is important for continued management of your diabetes. Please check your blood glucoses 2-4 times a day: fasting, before meals and at bedtime (you can rotate these measurements - e.g. one day check before the 3 meals, the next day check before 2 of the meals and before bedtime, etc.).   HYPOGLYCEMIA (low blood sugar) Hypoglycemia is usually a reaction to not eating, exercising, or taking too much insulin/ other diabetes drugs.  Symptoms include tremors, sweating, hunger, confusion, headache, etc. Treat IMMEDIATELY with 15 grams of Carbs: . 4 glucose tablets .  cup regular juice/soda . 2 tablespoons raisins . 4 teaspoons  sugar . 1 tablespoon honey Recheck blood glucose in 15 mins and repeat above if still symptomatic/blood glucose <100.  RECOMMENDATIONS TO REDUCE YOUR RISK OF DIABETIC COMPLICATIONS: * Take your prescribed MEDICATION(S) * Follow a DIABETIC diet: Complex carbs, fiber rich foods, (monounsaturated and polyunsaturated) fats * AVOID saturated/trans fats, high fat foods, >2,300 mg salt per day. * EXERCISE at least 5 times a week for 30 minutes or preferably daily.  * DO NOT SMOKE OR DRINK more than 1 drink a day. * Check your FEET every day. Do not wear tightfitting shoes. Contact us if you develop an ulcer * See your EYE doctor once a year or more if needed * Get a FLU shot once a year * Get a PNEUMONIA vaccine once before and once after age 21 years  GOALS:  * Your Hemoglobin A1c of <7%  * fasting sugars need to be <130 * after meals sugars need to be <180 (2h after you start eating) * Your Systolic BP should be 010 or lower  * Your Diastolic BP should be 80 or lower  * Your HDL (Good Cholesterol) should be 40 or higher  * Your LDL (Bad Cholesterol) should be 100 or lower. * Your Triglycerides should be 150 or lower  * Your Urine microalbumin (kidney function) should be <30 * Your Body Mass Index should be 25 or lower    Please consider the following ways to cut down carbs and fat and increase fiber and micronutrients in your diet: - substitute whole grain for white bread or pasta - substitute  brown rice for white rice - substitute 90-calorie flat bread pieces for slices of bread when possible - substitute sweet potatoes or yams for white potatoes - substitute humus for margarine - substitute tofu for cheese when possible - substitute almond or rice milk for regular milk (would not drink soy milk daily due to concern for soy estrogen influence on breast cancer risk) - substitute dark chocolate for other sweets when possible - substitute water - can add lemon or orange slices for taste  - for diet sodas (artificial sweeteners will trick your body that you can eat sweets without getting calories and will lead you to overeating and weight gain in the long run) - do not skip breakfast or other meals (this will slow down the metabolism and will result in more weight gain over time)  - can try smoothies made from fruit and almond/rice milk in am instead of regular breakfast - can also try old-fashioned (not instant) oatmeal made with almond/rice milk in am - order the dressing on the side when eating salad at a restaurant (pour less than half of the dressing on the salad) - eat as little meat as possible - can try juicing, but should not forget that juicing will get rid of the fiber, so would alternate with eating raw veg./fruits or drinking smoothies - use as little oil as possible, even when using olive oil - can dress a salad with a mix of balsamic vinegar and lemon juice, for e.g. - use agave nectar, stevia sugar, or regular sugar rather than artificial sweateners - steam or broil/roast veggies  - snack on veggies/fruit/nuts (unsalted, preferably) when possible, rather than processed foods - reduce or eliminate aspartame in diet (it is in diet sodas, chewing gum, etc) Read the labels!  Try to read Dr. Janene Harvey book: "Program for Reversing Diabetes" for other ideas for healthy eating.

## 2019-02-21 NOTE — Progress Notes (Signed)
Subjective:    [name]., male    DOB: 12-30-1955, 63 y.o.   MRN: [id number]  HPI:  01/28/2019 OV: George Lewis is here to establish as a new pt.   He is a pleasant 63 year old male. PMH: HTN, Obesity, OSA, Renal Cell Ca, S/P Nephrectomy 2019,CKD IV with fistula (left radiocephalic AV fistula on 12/06/4494) , and new onset diabetes A1c >14 at last hospital admission April 2020. He has been using various urgent cares for primary care needs and frequent ED visits with subsequent hospital admissions. He is followed by Dr. Upton/Nephrology-advised to follow up with 2-3 weeks and have the following labs: CBC, BMP, Intact PTH Echocardiogram recommended for nonsustained asymptomatic VT noted incidentally on Tele in the hospital He was started on Lantus and Humolog Sliding Scale, provided detailed instructions on how to manage diabetes  Pancreatic neck mass The patient had a1.1cm indeterminate pancreatic mass noted incidentally on MRI abdomen from 02/2018 in setting of RCC.  At that time was recommendedto have a3-6 monthintervalrepeat noncon MRI- never completed CT WO completed during April 2020 hospitalization  IMPRESSION: Hepatic steatosis. Left nephrolithiasis. Mild left hydronephrosis is noted without evidence of obstructing calculus. Status post right nephrectomy. Diverticulosis of descending and sigmoid colon without inflammation.  Recommendation to repeat MRI abdomen WO in 3-6 months  Recent Labs       Lab Results  Component Value Date   HGBA1C 14.9 (H) 12/19/2018     Mr. Magnan has been seen by his Nephrologist/Dr. Hollie Salk, labs completed. He was seen by his previous PCP, labs completed, but due to lapse in time since last OV- he was told he would have to re-establish as a new pt. He reports medication compliance, denies SE He has not used Metoprolol tartrate 34m BID in months due to SBP 110, his Nephrologist instructed him to hold BB if SBP  <130 He has been using Lantus and Humalog per Dr. DSabino Niemannclear instructions- BG has been steadily trending down, with BG in 80s the last two weeks- will decrease injectable therapy. He reports following Diabetic diet, walking frequently, and even doing yard work- as tolerated. He reports vision change, specifically blurred vision. He states "it has been years since I have had my eyes looked at".  Recommend diabetic eye examination   02/25/2019 OV: Ms. RDupreeis here f/u: T2D, Obesity, HTN, OSA,   Renal Cell Ca, S/P Nephrectomy 2019  He established with Endocrinologist 02/11/2019: anti-diabetic medications adjusted: Please continue: Metformin 1000 mg 2x a day, with meals- HE IS NOT ON METFORMIN Please decrease:  Lantus 32 units once a day at bedtime Novolog 6-8 units 3x a day, before meal Stop granola Please let me know if the sugars are consistently <80 or >200.  02/25/2019 OV: Mr. RTuforeports medication compliance. Per his BG log- he is having readings- 70-120s Advised him to call his Endocrinologist ASAP. He continues to slowly loss wt, current wt 301, 5 lbs wt loss since initial OV 01/28/2019 He has been following a diabetic diet, reports poor appetite. Discussed the importance of eating after taking the Humalog injection, he cannot skip meals after humalog injection. He reports fatigue that has been steadily worsening since Fall 2020. Echocardiogram ordered 01/28/2019- yet to be completed. He reports being able to climb the 14 stairs in his home with and without difficulty, "depending on the day". He denies CP/palpitations. He reports having several treadmill tests >15 years ago. He reports urinating several times/day He reports  difficulty with accepting that he cannot work- he is awaiting disability to be awarded. He will f/u with Nephrologist 15 March 2019. He denies any complications with L AV fistula  Patient Care Team    Relationship Specialty Notifications Start End   Mina Marble D, NP PCP - General Family Medicine  01/16/19   Elouise Munroe, MD PCP - Cardiology Cardiology Admissions 06/13/18     Patient Active Problem List   Diagnosis Date Noted  . Healthcare maintenance 01/28/2019  . DKA (diabetic ketoacidoses) ([place]) 12/19/2018  . Newly diagnosed diabetes ([place]) 12/19/2018  . Syncope 06/12/2018  . Ileus ([place])   . SVT (supraventricular tachycardia) ([place])   . Renal mass, right 05/16/2018  . Mass of pancreas   . Abnormal CT of the abdomen   . Bacteremia due to Enterococcus 04/23/2018  . Sepsis ([place]) 04/19/2018  . Acute renal failure superimposed on stage 4 chronic kidney disease ([place]) 04/19/2018  . Dehydration 04/19/2018  . UTI (urinary tract infection): Probable 04/19/2018  . Leukocytosis 04/19/2018  . Anemia 04/19/2018  . Renal cell carcinoma, right ([place]) 04/19/2018  . S/P ureteral stent placement: 04/16/2018 04/19/2018  . GERD (gastroesophageal reflux disease) 04/19/2018  . OSA on CPAP 04/19/2018  . HTN (hypertension) 04/19/2018     Past Medical History:  Diagnosis Date  . Arthritis    knees  . Bilateral renal cysts 03/23/2018   Noted MRI ABD  . Cholelithiasis 03/19/2018   noted on CT AB/Pelvis  . CKD (chronic kidney disease), stage IV [facility])    nephrologist-- dr Hollie Salk [name]);  05-01-2018 has not started dialysis, scheduled for AV fistula creation 05-07-2018  . Diverticulosis of colon 04/19/2018   Noted on CT abd/pelvis  . GERD (gastroesophageal reflux disease)   . Hepatic steatosis 03/19/2018   Mild diffuse, noted on CT AB/Pelvis  . History of kidney stones   . History of pulmonary embolus (PE) 03/2008   treated with coumadin for 6 months  . History of sepsis 04/20/2018   per discharge note , probable UTI  . Hypertension   . Nocturia   . OSA on CPAP   . Pancreatic lesion    0.4cm cystic per CT 07/ 2019  . Pneumonia   . Pulmonary nodule    Solid 19m Right Lower Lobe  . Right renal mass 04/10/2018   new  dx--  scheduled for nephrectomy 05-16-2018  . S/P ureteral stent placement: 04/16/2018 04/19/2018     Past Surgical History:  Procedure Laterality Date  . AV FISTULA PLACEMENT Left 05/07/2018   Procedure: Creation of Left arm Radiocephalic Fistula;  Surgeon: CMarty Heck MD;  [facility]  Service: Vascular;  Laterality: Left;  . CYSTOSCOPY/RETROGRADE/URETEROSCOPY/STONE EXTRACTION WITH BASKET  x2 last one 1990s approx.  . CYSTOSCOPY/URETEROSCOPY/HOLMIUM LASER/STENT PLACEMENT Left 04/16/2018   Procedure: CYSTOSCOPY/LEFT URETEROSCOPY/LEFT RETROGRADE/STENT PLACEMENT;  Surgeon: BLucas Mallow MD;  Location: [facility];  Service: Urology;  Laterality: Left;  . EUS N/A 05/03/2018   Procedure: UPPER ENDOSCOPIC ULTRASOUND (EUS) RADIAL;  Surgeon: JMilus Banister MD;  Location: [facility];  Service: Gastroenterology;  Laterality: N/A;  . EUS N/A 05/03/2018   Procedure: UPPER ENDOSCOPIC ULTRASOUND (EUS) LINEAR;  Surgeon: JMilus Banister MD;  [facility];  Service: Gastroenterology;  Laterality: N/A;  . EXTRACORPOREAL SHOCK WAVE LITHOTRIPSY  x3  last one 2004 approx.  .Marland KitchenFINE NEEDLE ASPIRATION N/A 05/03/2018   Procedure: FINE NEEDLE ASPIRATION (FNA) LINEAR;  Surgeon: JMilus Banister MD;  [facility];  Service: Gastroenterology;  Laterality: N/A;  . LAPAROSCOPIC NEPHRECTOMY, HAND ASSISTED Right 05/16/2018   Procedure: HAND ASSISTED LAPAROSCOPIC RIGHT NEPHRECTOMY;  Surgeon: Lucas Mallow, MD;  Location: [facility];  Service: Urology;  Laterality: Right;  . left index finger attachment  1980s   3-4 surgeries  . TOE SURGERY  1980s   in beween 2nd and 3rd toes cyst removed     Family History  Problem Relation Age of Onset  . Alzheimer's disease Mother   . Alzheimer's disease Father   . Heart disease Father   . Heart attack Father   . Cancer - Other Sister      Social History   Substance and Sexual Activity  Drug Use Never     Social History   Substance  and Sexual Activity  Alcohol Use Never  . Frequency: Never     Social History   Tobacco Use  Smoking Status Never Smoker  Smokeless Tobacco Never Used     Outpatient Encounter Medications as of 02/25/2019  Medication Sig  . allopurinol (ZYLOPRIM) 100 MG tablet Take 1 tablet (100 mg total) by mouth every evening.  . blood glucose meter kit and supplies Dispense based on patient and insurance preference. Use up to four times daily as directed. (FOR ICD-10 E10.9, E11.9).  Marland Kitchen Continuous Blood Gluc Sensor (FREESTYLE LIBRE 14 DAY SENSOR) MISC 1 each by Does not apply route every 14 (fourteen) days. Change every 2 weeks  . famotidine (PEPCID) 40 MG tablet Take 40 mg by mouth daily.  . furosemide (LASIX) 40 MG tablet Take 1 tablet by mouth daily.  . insulin glargine (LANTUS) 100 UNIT/ML injection Inject 0.32 mLs (32 Units total) into the skin at bedtime.  . insulin lispro (HUMALOG) 100 UNIT/ML cartridge Inject 0.06-0.08 mLs (6-8 Units total) into the skin 3 (three) times daily with meals. Per sliding scale.  . loratadine (CLARITIN) 10 MG tablet Take 10 mg by mouth every evening.   . metoprolol tartrate (LOPRESSOR) 25 MG tablet Take 25 mg by mouth daily.  Marland Kitchen omeprazole (PRILOSEC OTC) 20 MG tablet Take 20 mg by mouth daily.  . Potassium Citrate 15 MEQ (1620 MG) TBCR Take 1 tablet by mouth every evening.   . senna-docusate (SENOKOT-S) 8.6-50 MG tablet Take 1 tablet by mouth daily.  . sodium bicarbonate 650 MG tablet Take 650 mg by mouth daily.  . tamsulosin (FLOMAX) 0.4 MG CAPS capsule Take 0.4 mg by mouth every evening.   . [DISCONTINUED] diphenhydramine-acetaminophen (TYLENOL PM) 25-500 MG TABS tablet Take 1 tablet by mouth at bedtime as needed (sleep).  . [DISCONTINUED] fluticasone (FLONASE) 50 MCG/ACT nasal spray Place 2 sprays into both nostrils daily as needed for allergies or rhinitis.  . [DISCONTINUED] metoprolol succinate (TOPROL-XL) 25 MG 24 hr tablet Take 25 mg by mouth 2 (two) times a  day.  . [DISCONTINUED] metoprolol tartrate (LOPRESSOR) 25 MG tablet Take 1 tablet (25 mg total) by mouth 2 (two) times daily. (Patient taking differently: Take 25 mg by mouth daily. )  . [DISCONTINUED] sodium bicarbonate 650 MG tablet Take 1 tablet (650 mg total) by mouth 2 (two) times daily. (Patient taking differently: Take 650 mg by mouth daily. )   No facility-administered encounter medications on file as of 02/25/2019.     Allergies: Penicillins  Body mass index is 39.75 kg/m.  Blood pressure 124/77, pulse 77, temperature 98.7 F (37.1 C), temperature source Oral, height '6\' 1"'  (1.854 m), weight (!) 301 lb 4.8 oz (136.7 kg), SpO2  100 %.   Review of Systems  Constitutional: Positive for appetite change and fatigue. Negative for activity change, chills, diaphoresis, fever and unexpected weight change.  Eyes: Negative for visual disturbance.  Respiratory: Negative for cough, chest tightness, shortness of breath, wheezing and stridor.   Cardiovascular: Negative for chest pain, palpitations and leg swelling.  Gastrointestinal: Negative for abdominal distention, abdominal pain, blood in stool, constipation, diarrhea, nausea and vomiting.  Endocrine: Negative for cold intolerance, heat intolerance, polydipsia, polyphagia and polyuria.  Genitourinary: Negative for difficulty urinating and hematuria.  Neurological: Negative for dizziness and headaches.  Hematological: Negative for adenopathy. Does not bruise/bleed easily.  Psychiatric/Behavioral: Negative for agitation, behavioral problems, confusion, decreased concentration, dysphoric mood, hallucinations, self-injury, sleep disturbance and suicidal ideas. The patient is not nervous/anxious and is not hyperactive.        Objective:   Physical Exam Constitutional:      General: He is not in acute distress.    Appearance: Normal appearance. He is obese. He is not ill-appearing, toxic-appearing or diaphoretic.  HENT:     Head:  Normocephalic and atraumatic.  Cardiovascular:     Rate and Rhythm: Normal rate.     Pulses: Normal pulses.     Heart sounds: Normal heart sounds. No murmur. No friction rub. No gallop.      Arteriovenous access: left arteriovenous access is present.    Comments: + thrill Pulmonary:     Effort: Pulmonary effort is normal. No respiratory distress.     Breath sounds: Normal breath sounds. No stridor. No wheezing, rhonchi or rales.  Chest:     Chest wall: No tenderness.  Skin:    General: Skin is warm and dry.     Capillary Refill: Capillary refill takes less than 2 seconds.  Neurological:     Mental Status: He is alert and oriented to person, place, and time.  Psychiatric:        Mood and Affect: Mood normal.        Behavior: Behavior normal.        Thought Content: Thought content normal.        Judgment: Judgment normal.       Assessment & Plan:   1. Insulin dependent diabetes mellitus (HCC)   2. Leukocytosis, unspecified type   3. Anemia, unspecified type   4. Healthcare maintenance   5. Newly diagnosed diabetes ([place])   6. SVT (supraventricular tachycardia) ([place])   7. Essential hypertension     Healthcare maintenance Please call your Endocrinologist about several blood glucose readings in 70 and 80s. Echocardiogram needs to be completed ASAP- someone from imaging will call you to schedule the appt. We will call you when lab results are available. Continue with Endocrinologist and Nephrologist as directed. Follow-up here in 3 months, sooner if needed. Continue to social distance and wear a mask when out in public.  Newly diagnosed diabetes ([place]) Lab Results  Component Value Date   HGBA1C 7.4 (A) 02/11/2019   HGBA1C 14.9 (H) 12/19/2018  Per his BG log- he is having readings- 70-120s Advised him to call his Endocrinologist ASAP. He continues to slowly loss wt, current wt 301, 5 lbs wt loss since initial OV 01/28/2019 He has been following a diabetic diet, reports poor  appetite. Discussed the importance of eating after taking the Humalog injection, he cannot skip meals after humalog injection.  HTN (hypertension) BP at goal 124/77, HR 77 Continue Lopressor 78m BID Echo ordered again- first one D/c'd by imaging center  FOLLOW-UP:  Return in about 3 months (around 05/28/2019) for HTN, Regular Follow Up, Obesity, Diabetes.

## 2019-02-25 ENCOUNTER — Ambulatory Visit: Payer: PRIVATE HEALTH INSURANCE | Admitting: Adult Health

## 2019-02-25 ENCOUNTER — Telehealth: Payer: Self-pay | Admitting: Internal Medicine

## 2019-02-25 ENCOUNTER — Other Ambulatory Visit: Payer: Self-pay

## 2019-02-25 ENCOUNTER — Encounter: Payer: Self-pay | Admitting: Adult Health

## 2019-02-25 VITALS — BP 124/77 | HR 77 | Temp 98.7°F | Ht 73.0 in | Wt 301.3 lb

## 2019-02-25 DIAGNOSIS — Z0000 Encounter for general adult medical examination without abnormal findings: Secondary | ICD-10-CM | POA: Diagnosis not present

## 2019-02-25 DIAGNOSIS — I10 Essential (primary) hypertension: Secondary | ICD-10-CM

## 2019-02-25 DIAGNOSIS — Z794 Long term (current) use of insulin: Secondary | ICD-10-CM

## 2019-02-25 DIAGNOSIS — IMO0001 Reserved for inherently not codable concepts without codable children: Secondary | ICD-10-CM

## 2019-02-25 DIAGNOSIS — D72829 Elevated white blood cell count, unspecified: Secondary | ICD-10-CM

## 2019-02-25 DIAGNOSIS — E119 Type 2 diabetes mellitus without complications: Secondary | ICD-10-CM | POA: Diagnosis not present

## 2019-02-25 DIAGNOSIS — D649 Anemia, unspecified: Secondary | ICD-10-CM

## 2019-02-25 DIAGNOSIS — I471 Supraventricular tachycardia: Secondary | ICD-10-CM

## 2019-02-25 NOTE — Assessment & Plan Note (Addendum)
Lab Results  Component Value Date   HGBA1C 7.4 (A) 02/11/2019   HGBA1C 14.9 (H) 12/19/2018  Per his BG log- he is having readings- 70-120s Advised him to call his Endocrinologist ASAP. He continues to slowly loss wt, current wt 301, 5 lbs wt loss since initial OV 01/28/2019 He has been following a diabetic diet, reports poor appetite. Discussed the importance of eating after taking the Humalog injection, he cannot skip meals after humalog injection.

## 2019-02-25 NOTE — Telephone Encounter (Signed)
Wife states that Dr Loleta Books reviewed patients Glucose logs and indicated that he is having consistent lows that possibly need to be addressed.  Patients wife is requesting a call back at [phone]

## 2019-02-25 NOTE — Assessment & Plan Note (Signed)
Please call your Endocrinologist about several blood glucose readings in 70 and 80s. Echocardiogram needs to be completed ASAP- someone from imaging will call you to schedule the appt. We will call you when lab results are available. Continue with Endocrinologist and Nephrologist as directed. Follow-up here in 3 months, sooner if needed. Continue to social distance and wear a mask when out in public.

## 2019-02-25 NOTE — Patient Instructions (Signed)
Diabetes Mellitus and Nutrition, Adult When you have diabetes (diabetes mellitus), it is very important to have healthy eating habits because your blood sugar (glucose) levels are greatly affected by what you eat and drink. Eating healthy foods in the appropriate amounts, at about the same times every day, can help you:  Control your blood glucose.  Lower your risk of heart disease.  Improve your blood pressure.  Reach or maintain a healthy weight. Every person with diabetes is different, and each person has different needs for a meal plan. Your health care provider may recommend that you work with a diet and nutrition specialist (dietitian) to make a meal plan that is best for you. Your meal plan may vary depending on factors such as:  The calories you need.  The medicines you take.  Your weight.  Your blood glucose, blood pressure, and cholesterol levels.  Your activity level.  Other health conditions you have, such as heart or kidney disease. How do carbohydrates affect me? Carbohydrates, also called carbs, affect your blood glucose level more than any other type of food. Eating carbs naturally raises the amount of glucose in your blood. Carb counting is a method for keeping track of how many carbs you eat. Counting carbs is important to keep your blood glucose at a healthy level, especially if you use insulin or take certain oral diabetes medicines. It is important to know how many carbs you can safely have in each meal. This is different for every person. Your dietitian can help you calculate how many carbs you should have at each meal and for each snack. Foods that contain carbs include:  Bread, cereal, rice, pasta, and crackers.  Potatoes and corn.  Peas, beans, and lentils.  Milk and yogurt.  Fruit and juice.  Desserts, such as cakes, cookies, ice cream, and candy. How does alcohol affect me? Alcohol can cause a sudden decrease in blood glucose (hypoglycemia),  especially if you use insulin or take certain oral diabetes medicines. Hypoglycemia can be a life-threatening condition. Symptoms of hypoglycemia (sleepiness, dizziness, and confusion) are similar to symptoms of having too much alcohol. If your health care provider says that alcohol is safe for you, follow these guidelines:  Limit alcohol intake to no more than 1 drink per day for nonpregnant women and 2 drinks per day for men. One drink equals 12 oz of beer, 5 oz of wine, or 1 oz of hard liquor.  Do not drink on an empty stomach.  Keep yourself hydrated with water, diet soda, or unsweetened iced tea.  Keep in mind that regular soda, juice, and other mixers may contain a lot of sugar and must be counted as carbs. What are tips for following this plan?  Reading food labels  Start by checking the serving size on the "Nutrition Facts" label of packaged foods and drinks. The amount of calories, carbs, fats, and other nutrients listed on the label is based on one serving of the item. Many items contain more than one serving per package.  Check the total grams (g) of carbs in one serving. You can calculate the number of servings of carbs in one serving by dividing the total carbs by 15. For example, if a food has 30 g of total carbs, it would be equal to 2 servings of carbs.  Check the number of grams (g) of saturated and trans fats in one serving. Choose foods that have low or no amount of these fats.  Check the number of  milligrams (mg) of salt (sodium) in one serving. Most people should limit total sodium intake to less than 2,300 mg per day.  Always check the nutrition information of foods labeled as "low-fat" or "nonfat". These foods may be higher in added sugar or refined carbs and should be avoided.  Talk to your dietitian to identify your daily goals for nutrients listed on the label. Shopping  Avoid buying canned, premade, or processed foods. These foods tend to be high in fat, sodium,  and added sugar.  Shop around the outside edge of the grocery store. This includes fresh fruits and vegetables, bulk grains, fresh meats, and fresh dairy. Cooking  Use low-heat cooking methods, such as baking, instead of high-heat cooking methods like deep frying.  Cook using healthy oils, such as olive, canola, or sunflower oil.  Avoid cooking with butter, cream, or high-fat meats. Meal planning  Eat meals and snacks regularly, preferably at the same times every day. Avoid going long periods of time without eating.  Eat foods high in fiber, such as fresh fruits, vegetables, beans, and whole grains. Talk to your dietitian about how many servings of carbs you can eat at each meal.  Eat 4-6 ounces (oz) of lean protein each day, such as lean meat, chicken, fish, eggs, or tofu. One oz of lean protein is equal to: ? 1 oz of meat, chicken, or fish. ? 1 egg. ?  cup of tofu.  Eat some foods each day that contain healthy fats, such as avocado, nuts, seeds, and fish. Lifestyle  Check your blood glucose regularly.  Exercise regularly as told by your health care provider. This may include: ? 150 minutes of moderate-intensity or vigorous-intensity exercise each week. This could be brisk walking, biking, or water aerobics. ? Stretching and doing strength exercises, such as yoga or weightlifting, at least 2 times a week.  Take medicines as told by your health care provider.  Do not use any products that contain nicotine or tobacco, such as cigarettes and e-cigarettes. If you need help quitting, ask your health care provider.  Work with a Social worker or diabetes educator to identify strategies to manage stress and any emotional and social challenges. Questions to ask a health care provider  Do I need to meet with a diabetes educator?  Do I need to meet with a dietitian?  What number can I call if I have questions?  When are the best times to check my blood glucose? Where to find more  information:  American Diabetes Association: diabetes.org  Academy of Nutrition and Dietetics: www.eatright.[employer] of Diabetes and Digestive and Kidney Diseases (NIH): [employer] Summary  A healthy meal plan will help you control your blood glucose and maintain a healthy lifestyle.  Working with a diet and nutrition specialist (dietitian) can help you make a meal plan that is best for you.  Keep in mind that carbohydrates (carbs) and alcohol have immediate effects on your blood glucose levels. It is important to count carbs and to use alcohol carefully. This information is not intended to replace advice given to you by your health care provider. Make sure you discuss any questions you have with your health care provider. Document Released: 05/12/2005 Document Revised: 07/28/2017 Document Reviewed: 09/19/2016 Elsevier Patient Education  2020 [place].   Please call your Endocrinologist about several blood glucose readings in 70 and 80s. Echocardiogram needs to be completed ASAP- someone from imaging will call you to schedule the appt. We will call you when  lab results are available. Continue with Endocrinologist and Nephrologist as directed. Follow-up here in 3 months, sooner if needed. Continue to social distance and wear a mask when out in public. GREAT TO SEE YOU!

## 2019-02-25 NOTE — Assessment & Plan Note (Addendum)
BP at goal 124/77, HR 77 Continue Lopressor 25mg  BID Echo ordered again- first one D/c'd by imaging center

## 2019-02-26 ENCOUNTER — Ambulatory Visit (HOSPITAL_COMMUNITY)
Admission: RE | Admit: 2019-02-26 | Discharge: 2019-02-26 | Disposition: A | Discharge status: Home / Self Care | Payer: PRIVATE HEALTH INSURANCE | Source: Ambulatory Visit | Attending: Adult Health | Admitting: Adult Health

## 2019-02-26 DIAGNOSIS — I10 Essential (primary) hypertension: Secondary | ICD-10-CM | POA: Insufficient documentation

## 2019-02-26 DIAGNOSIS — I471 Supraventricular tachycardia: Secondary | ICD-10-CM | POA: Insufficient documentation

## 2019-02-26 LAB — COMPREHENSIVE METABOLIC PANEL
ALT: 6 IU/L (ref 0–44)
AST: 15 IU/L (ref 0–40)
Albumin/Globulin Ratio: 1.5 (ref 1.2–2.2)
Albumin: 4.2 g/dL (ref 3.8–4.9)
Alkaline Phosphatase: 98 IU/L (ref 39–117)
BUN/Creatinine Ratio: 11 (ref 10–24)
BUN: 47 mg/dL — ABNORMAL HIGH (ref 8–27)
Bilirubin Total: 0.3 mg/dL (ref 0.0–1.2)
CO2: 18 mmol/L — ABNORMAL LOW (ref 20–29)
Calcium: 9 mg/dL (ref 8.6–10.2)
Chloride: 104 mmol/L (ref 96–106)
Creatinine, Ser: 4.14 mg/dL — ABNORMAL HIGH (ref 0.76–1.27)
GFR calc Af Amer: 17 mL/min/{1.73_m2} — ABNORMAL LOW (ref >59)
GFR calc non Af Amer: 15 mL/min/{1.73_m2} — ABNORMAL LOW (ref >59)
Globulin, Total: 2.8 g/dL (ref 1.5–4.5)
Glucose: 89 mg/dL (ref 65–99)
Potassium: 4.7 mmol/L (ref 3.5–5.2)
Sodium: 141 mmol/L (ref 134–144)
Total Protein: 7 g/dL (ref 6.0–8.5)

## 2019-02-26 LAB — LIPID PANEL
Chol/HDL Ratio: 7.3 ratio — ABNORMAL HIGH (ref 0.0–5.0)
Cholesterol, Total: 183 mg/dL (ref 100–199)
HDL: 25 mg/dL — ABNORMAL LOW (ref >39)
LDL Calculated: 131 mg/dL — ABNORMAL HIGH (ref 0–99)
Triglycerides: 135 mg/dL (ref 0–149)
VLDL Cholesterol Cal: 27 mg/dL (ref 5–40)

## 2019-02-26 LAB — TSH: TSH: 2.96 u[IU]/mL (ref 0.450–4.500)

## 2019-02-26 NOTE — Telephone Encounter (Signed)
Per notes from his PCP visit:   02/25/2019 OV: George Lewis reports medication compliance. Per his BG log- he is having readings- 70-120s Advised him to call his Endocrinologist ASAP. He continues to slowly loss wt, current wt 301, 5 lbs wt loss since initial OV 01/28/2019 He has been following a diabetic diet, reports poor appetite.

## 2019-02-26 NOTE — Progress Notes (Signed)
  Echocardiogram 2D Echocardiogram has been performed.  Oneal Deputy Gatlyn Lipari 02/26/2019, 11:56 AM

## 2019-02-26 NOTE — Telephone Encounter (Signed)
Please advised the patient to decrease: - Lantus to 32 >> 26 units once a day at bedtime - Novolog 6(-8) units 3x a day, before meals

## 2019-02-27 ENCOUNTER — Other Ambulatory Visit: Payer: Self-pay | Admitting: Adult Health

## 2019-02-27 DIAGNOSIS — E7800 Pure hypercholesterolemia, unspecified: Secondary | ICD-10-CM

## 2019-02-27 DIAGNOSIS — Z79899 Other long term (current) drug therapy: Secondary | ICD-10-CM

## 2019-02-27 MED ORDER — ATORVASTATIN CALCIUM 20 MG PO TABS: 20.0000 mg | ORAL_TABLET | Freq: Every day | ORAL | 0 refills | Status: DC

## 2019-02-27 NOTE — Telephone Encounter (Signed)
Notified patient of message from Dr. Cruzita Lederer, patient expressed understanding and agreement. No further questions.  They will call us next week to let us know how his sugars are doing with the dose change.

## 2019-03-06 ENCOUNTER — Other Ambulatory Visit: Payer: Self-pay

## 2019-03-06 ENCOUNTER — Emergency Department (HOSPITAL_COMMUNITY): Payer: PRIVATE HEALTH INSURANCE

## 2019-03-06 ENCOUNTER — Observation Stay (HOSPITAL_COMMUNITY): Payer: PRIVATE HEALTH INSURANCE

## 2019-03-06 ENCOUNTER — Observation Stay (HOSPITAL_COMMUNITY)
Admission: EM | Admit: 2019-03-06 | Discharge: 2019-03-07 | Disposition: A | Discharge status: Home / Self Care | Payer: PRIVATE HEALTH INSURANCE | Attending: Internal Medicine | Admitting: Internal Medicine

## 2019-03-06 ENCOUNTER — Encounter (HOSPITAL_COMMUNITY): Payer: Self-pay | Admitting: *Deleted

## 2019-03-06 DIAGNOSIS — E785 Hyperlipidemia, unspecified: Secondary | ICD-10-CM | POA: Insufficient documentation

## 2019-03-06 DIAGNOSIS — I120 Hypertensive chronic kidney disease with stage 5 chronic kidney disease or end stage renal disease: Secondary | ICD-10-CM | POA: Insufficient documentation

## 2019-03-06 DIAGNOSIS — Z79899 Other long term (current) drug therapy: Secondary | ICD-10-CM | POA: Diagnosis not present

## 2019-03-06 DIAGNOSIS — E1122 Type 2 diabetes mellitus with diabetic chronic kidney disease: Secondary | ICD-10-CM | POA: Insufficient documentation

## 2019-03-06 DIAGNOSIS — E1169 Type 2 diabetes mellitus with other specified complication: Secondary | ICD-10-CM

## 2019-03-06 DIAGNOSIS — G4733 Obstructive sleep apnea (adult) (pediatric): Secondary | ICD-10-CM

## 2019-03-06 DIAGNOSIS — R001 Bradycardia, unspecified: Secondary | ICD-10-CM | POA: Insufficient documentation

## 2019-03-06 DIAGNOSIS — C641 Malignant neoplasm of right kidney, except renal pelvis: Secondary | ICD-10-CM

## 2019-03-06 DIAGNOSIS — I10 Essential (primary) hypertension: Secondary | ICD-10-CM | POA: Diagnosis not present

## 2019-03-06 DIAGNOSIS — R079 Chest pain, unspecified: Secondary | ICD-10-CM | POA: Diagnosis present

## 2019-03-06 DIAGNOSIS — K760 Fatty (change of) liver, not elsewhere classified: Secondary | ICD-10-CM | POA: Insufficient documentation

## 2019-03-06 DIAGNOSIS — K810 Acute cholecystitis: Secondary | ICD-10-CM

## 2019-03-06 DIAGNOSIS — Z85528 Personal history of other malignant neoplasm of kidney: Secondary | ICD-10-CM | POA: Diagnosis not present

## 2019-03-06 DIAGNOSIS — Z86711 Personal history of pulmonary embolism: Secondary | ICD-10-CM | POA: Diagnosis not present

## 2019-03-06 DIAGNOSIS — R0602 Shortness of breath: Secondary | ICD-10-CM | POA: Insufficient documentation

## 2019-03-06 DIAGNOSIS — N186 End stage renal disease: Secondary | ICD-10-CM | POA: Diagnosis not present

## 2019-03-06 DIAGNOSIS — Z20828 Contact with and (suspected) exposure to other viral communicable diseases: Secondary | ICD-10-CM | POA: Insufficient documentation

## 2019-03-06 DIAGNOSIS — Z880 Allergy status to penicillin: Secondary | ICD-10-CM | POA: Diagnosis not present

## 2019-03-06 DIAGNOSIS — R072 Precordial pain: Secondary | ICD-10-CM | POA: Diagnosis not present

## 2019-03-06 DIAGNOSIS — Z905 Acquired absence of kidney: Secondary | ICD-10-CM | POA: Insufficient documentation

## 2019-03-06 DIAGNOSIS — R0789 Other chest pain: Secondary | ICD-10-CM | POA: Diagnosis not present

## 2019-03-06 DIAGNOSIS — Z9989 Dependence on other enabling machines and devices: Secondary | ICD-10-CM

## 2019-03-06 DIAGNOSIS — Z794 Long term (current) use of insulin: Secondary | ICD-10-CM | POA: Diagnosis present

## 2019-03-06 DIAGNOSIS — E669 Obesity, unspecified: Secondary | ICD-10-CM | POA: Diagnosis not present

## 2019-03-06 DIAGNOSIS — E119 Type 2 diabetes mellitus without complications: Secondary | ICD-10-CM | POA: Diagnosis present

## 2019-03-06 LAB — COMPREHENSIVE METABOLIC PANEL
ALT: 11 U/L (ref 0–44)
AST: 17 U/L (ref 15–41)
Albumin: 3.7 g/dL (ref 3.5–5.0)
Alkaline Phosphatase: 98 U/L (ref 38–126)
Anion gap: 14 (ref 5–15)
BUN: 56 mg/dL — ABNORMAL HIGH (ref 6–20)
CO2: 20 mmol/L — ABNORMAL LOW (ref 22–32)
Calcium: 9 mg/dL (ref 8.9–10.3)
Chloride: 102 mmol/L (ref 98–111)
Creatinine, Ser: 4.54 mg/dL — ABNORMAL HIGH (ref 0.61–1.24)
GFR calc Af Amer: 15 mL/min — ABNORMAL LOW (ref >60)
GFR calc non Af Amer: 13 mL/min — ABNORMAL LOW (ref >60)
Glucose, Bld: 101 mg/dL — ABNORMAL HIGH (ref 70–99)
Potassium: 4.7 mmol/L (ref 3.5–5.1)
Sodium: 136 mmol/L (ref 135–145)
Total Bilirubin: 0.7 mg/dL (ref 0.3–1.2)
Total Protein: 7.6 g/dL (ref 6.5–8.1)

## 2019-03-06 LAB — GLUCOSE, CAPILLARY
Glucose-Capillary: 88 mg/dL (ref 70–99)
Glucose-Capillary: 97 mg/dL (ref 70–99)
Glucose-Capillary: 135 mg/dL — ABNORMAL HIGH (ref 70–99)

## 2019-03-06 LAB — LIPASE, BLOOD: Lipase: 32 U/L (ref 11–51)

## 2019-03-06 LAB — CBC
HCT: 39.2 % (ref 39.0–52.0)
Hemoglobin: 12.3 g/dL — ABNORMAL LOW (ref 13.0–17.0)
MCH: 27.4 pg (ref 26.0–34.0)
MCHC: 31.4 g/dL (ref 30.0–36.0)
MCV: 87.3 fL (ref 80.0–100.0)
Platelets: 252 10*3/uL (ref 150–400)
RBC: 4.49 MIL/uL (ref 4.22–5.81)
RDW: 14.8 % (ref 11.5–15.5)
WBC: 12.2 10*3/uL — ABNORMAL HIGH (ref 4.0–10.5)
nRBC: 0 % (ref 0.0–0.2)

## 2019-03-06 LAB — CBG MONITORING, ED: Glucose-Capillary: 95 mg/dL (ref 70–99)

## 2019-03-06 LAB — TROPONIN I (HIGH SENSITIVITY)
Troponin I (High Sensitivity): 9 ng/L (ref <18)
Troponin I (High Sensitivity): 7 ng/L (ref <18)
Troponin I (High Sensitivity): 6 ng/L (ref <18)

## 2019-03-06 LAB — D-DIMER, QUANTITATIVE: D-Dimer, Quant: 0.82 ug/mL-FEU — ABNORMAL HIGH (ref 0.00–0.50)

## 2019-03-06 LAB — BRAIN NATRIURETIC PEPTIDE: B Natriuretic Peptide: 34.8 pg/mL (ref 0.0–100.0)

## 2019-03-06 LAB — SARS CORONAVIRUS 2 BY RT PCR (HOSPITAL ORDER, PERFORMED IN ~~LOC~~ HOSPITAL LAB): SARS Coronavirus 2: NEGATIVE

## 2019-03-06 MED ORDER — ACETAMINOPHEN 325 MG PO TABS
650.0000 mg | ORAL_TABLET | ORAL | Status: DC | PRN
  Administered 2019-03-06: 23:00:00 650 mg via ORAL
  Filled 2019-03-06: qty 2

## 2019-03-06 MED ORDER — TECHNETIUM TO 99M ALBUMIN AGGREGATED
1.5000 | Freq: Once | INTRAVENOUS | Status: AC | PRN
  Administered 2019-03-06: 14:00:00 1.5 via INTRAVENOUS

## 2019-03-06 MED ORDER — SODIUM CHLORIDE 0.9% FLUSH
3.0000 mL | Freq: Once | INTRAVENOUS | Status: AC
  Administered 2019-03-06: 3 mL via INTRAVENOUS

## 2019-03-06 MED ORDER — ATORVASTATIN CALCIUM 10 MG PO TABS
20.0000 mg | ORAL_TABLET | Freq: Every day | ORAL | Status: DC
  Administered 2019-03-06: 20 mg via ORAL
  Filled 2019-03-06: qty 2

## 2019-03-06 MED ORDER — METOPROLOL TARTRATE 25 MG PO TABS
25.0000 mg | ORAL_TABLET | Freq: Every day | ORAL | Status: DC
  Administered 2019-03-06 – 2019-03-07 (×2): 25 mg via ORAL
  Filled 2019-03-06 (×2): qty 1

## 2019-03-06 MED ORDER — LORATADINE 10 MG PO TABS
10.0000 mg | ORAL_TABLET | Freq: Every evening | ORAL | Status: DC
  Administered 2019-03-06: 10 mg via ORAL
  Filled 2019-03-06: qty 1

## 2019-03-06 MED ORDER — NEPRO/CARBSTEADY PO LIQD: 237.0000 mL | Freq: Three times a day (TID) | ORAL | Status: DC | PRN

## 2019-03-06 MED ORDER — INSULIN ASPART 100 UNIT/ML ~~LOC~~ SOLN: 0.0000 [IU] | Freq: Every day | SUBCUTANEOUS | Status: DC

## 2019-03-06 MED ORDER — ZOLPIDEM TARTRATE 5 MG PO TABS: 5.0000 mg | ORAL_TABLET | Freq: Every evening | ORAL | Status: DC | PRN

## 2019-03-06 MED ORDER — FENTANYL CITRATE (PF) 100 MCG/2ML IJ SOLN
50.0000 ug | Freq: Once | INTRAMUSCULAR | Status: AC
  Administered 2019-03-06: 50 ug via INTRAVENOUS
  Filled 2019-03-06: qty 2

## 2019-03-06 MED ORDER — SODIUM BICARBONATE 650 MG PO TABS
650.0000 mg | ORAL_TABLET | Freq: Every day | ORAL | Status: DC
  Administered 2019-03-06 – 2019-03-07 (×2): 650 mg via ORAL
  Filled 2019-03-06 (×2): qty 1

## 2019-03-06 MED ORDER — ONDANSETRON HCL 4 MG/2ML IJ SOLN: 4.0000 mg | Freq: Four times a day (QID) | INTRAMUSCULAR | Status: DC | PRN

## 2019-03-06 MED ORDER — INSULIN GLARGINE 100 UNIT/ML ~~LOC~~ SOLN
26.0000 [IU] | Freq: Every day | SUBCUTANEOUS | Status: DC
  Administered 2019-03-06: 26 [IU] via SUBCUTANEOUS
  Filled 2019-03-06 (×2): qty 0.26

## 2019-03-06 MED ORDER — POTASSIUM CITRATE ER 10 MEQ (1080 MG) PO TBCR
10.0000 meq | EXTENDED_RELEASE_TABLET | Freq: Every evening | ORAL | Status: DC
  Administered 2019-03-06: 10 meq via ORAL
  Filled 2019-03-06 (×2): qty 1

## 2019-03-06 MED ORDER — TAMSULOSIN HCL 0.4 MG PO CAPS
0.4000 mg | ORAL_CAPSULE | Freq: Every evening | ORAL | Status: DC
  Administered 2019-03-06: 17:00:00 0.4 mg via ORAL
  Filled 2019-03-06: qty 1

## 2019-03-06 MED ORDER — FUROSEMIDE 40 MG PO TABS
40.0000 mg | ORAL_TABLET | Freq: Every day | ORAL | Status: DC
  Administered 2019-03-06 – 2019-03-07 (×2): 40 mg via ORAL
  Filled 2019-03-06 (×2): qty 1

## 2019-03-06 MED ORDER — INSULIN ASPART 100 UNIT/ML ~~LOC~~ SOLN: 0.0000 [IU] | Freq: Three times a day (TID) | SUBCUTANEOUS | Status: DC

## 2019-03-06 MED ORDER — DOCUSATE SODIUM 283 MG RE ENEM
1.0000 | ENEMA | RECTAL | Status: DC | PRN
  Filled 2019-03-06: qty 1

## 2019-03-06 MED ORDER — SORBITOL 70 % SOLN: 30.0000 mL | Status: DC | PRN

## 2019-03-06 MED ORDER — NITROGLYCERIN 0.4 MG SL SUBL: 0.4000 mg | SUBLINGUAL_TABLET | SUBLINGUAL | Status: DC | PRN

## 2019-03-06 MED ORDER — ATORVASTATIN CALCIUM 40 MG PO TABS
40.0000 mg | ORAL_TABLET | Freq: Every day | ORAL | Status: DC
  Administered 2019-03-07: 40 mg via ORAL
  Filled 2019-03-06: qty 1

## 2019-03-06 MED ORDER — ENOXAPARIN SODIUM 30 MG/0.3ML ~~LOC~~ SOLN
30.0000 mg | SUBCUTANEOUS | Status: DC
  Administered 2019-03-06: 30 mg via SUBCUTANEOUS
  Filled 2019-03-06: qty 0.3

## 2019-03-06 MED ORDER — ALLOPURINOL 100 MG PO TABS
100.0000 mg | ORAL_TABLET | Freq: Every evening | ORAL | Status: DC
  Administered 2019-03-06: 100 mg via ORAL
  Filled 2019-03-06: qty 1

## 2019-03-06 MED ORDER — ALUM & MAG HYDROXIDE-SIMETH 200-200-20 MG/5ML PO SUSP
30.0000 mL | Freq: Once | ORAL | Status: AC
  Administered 2019-03-06: 30 mL via ORAL
  Filled 2019-03-06: qty 30

## 2019-03-06 MED ORDER — CAMPHOR-MENTHOL 0.5-0.5 % EX LOTN
1.0000 "application " | TOPICAL_LOTION | Freq: Three times a day (TID) | CUTANEOUS | Status: DC | PRN
  Filled 2019-03-06: qty 222

## 2019-03-06 MED ORDER — ASPIRIN EC 81 MG PO TBEC
81.0000 mg | DELAYED_RELEASE_TABLET | Freq: Every day | ORAL | Status: DC
  Administered 2019-03-07: 09:00:00 81 mg via ORAL
  Filled 2019-03-06: qty 1

## 2019-03-06 MED ORDER — OMEPRAZOLE MAGNESIUM 20 MG PO TBEC: 20.0000 mg | DELAYED_RELEASE_TABLET | Freq: Every day | ORAL | Status: DC

## 2019-03-06 MED ORDER — HYDROXYZINE HCL 25 MG PO TABS: 25.0000 mg | ORAL_TABLET | Freq: Three times a day (TID) | ORAL | Status: DC | PRN

## 2019-03-06 MED ORDER — CALCIUM CARBONATE ANTACID 1250 MG/5ML PO SUSP
500.0000 mg | Freq: Four times a day (QID) | ORAL | Status: DC | PRN
  Filled 2019-03-06: qty 5

## 2019-03-06 NOTE — ED Triage Notes (Signed)
C/o anterior chest pain with radiation into his back states he was awaken at 430 am. Denies n/v , states pain is worse when he takes a deep breath.

## 2019-03-06 NOTE — ED Notes (Addendum)
ED TO INPATIENT HANDOFF REPORT  ED Nurse Name and Phone #: Thurmond Butts [place] Name/Age/Gender George Lewis. 63 y.o. male Room/Bed: 034C/034C  Code Status   Code Status: DNR  Home/SNF/Other Home Patient oriented to: self, place, time and situation Is this baseline? Yes   Triage Complete: Triage complete  Chief Complaint chest pain  Triage Note C/o anterior chest pain with radiation into his back states he was awaken at 430 am. Denies n/v , states pain is worse when he takes a deep breath.    Allergies Allergies  Allergen Reactions  . Penicillins Rash and Other (See Comments)    Has patient had a PCN reaction causing immediate rash, facial/tongue/throat swelling, SOB or lightheadedness with hypotension: yes Has patient had a PCN reaction causing severe rash involving mucus membranes or skin necrosis: no Has patient had a PCN reaction that required hospitalization: no Has patient had a PCN reaction occurring within the last 10 years: no If all of the above answers are "NO", then may proceed with Cephalosporin use.     Level of Care/Admitting Diagnosis ED Disposition    ED Disposition Condition Comment   Admit  Hospital Area: [facility] [[id number]]  Level of Care: Telemetry Cardiac [103]  I expect the patient will be discharged within 24 hours: Yes  LOW acuity---Tx typically complete <24 hrs---ACUTE conditions typically can be evaluated <24 hours---LABS likely to return to acceptable levels <24 hours---IS near functional baseline---EXPECTED to return to current living arrangement---NOT newly hypoxic: Meets criteria for 5C-Observation unit  Covid Evaluation: Asymptomatic Screening Protocol (No Symptoms)  Diagnosis: Chest pain [284132]  Admitting Physician: Karmen Bongo [[id number]]  Attending Physician: Karmen Bongo [[id number]]  PT Class (Do Not Modify): Observation [104]  PT Acc Code (Do Not Modify): Observation [10022]       B Medical/Surgery  History Past Medical History:  Diagnosis Date  . Arthritis    knees  . Bilateral renal cysts 03/23/2018   Noted MRI ABD  . Cholelithiasis 03/19/2018   noted on CT AB/Pelvis  . CKD (chronic kidney disease), stage IV [facility])    nephrologist-- dr Hollie Salk [name]);  05-01-2018 has not started dialysis, scheduled for AV fistula creation 05-07-2018  . Diverticulosis of colon 04/19/2018   Noted on CT abd/pelvis  . GERD (gastroesophageal reflux disease)   . Hepatic steatosis 03/19/2018   Mild diffuse, noted on CT AB/Pelvis  . History of kidney stones   . History of pulmonary embolus (PE) 03/2008   treated with coumadin for 6 months  . History of sepsis 04/20/2018   per discharge note , probable UTI  . Hypertension   . Nocturia   . OSA on CPAP   . Pancreatic lesion    0.4cm cystic per CT 07/ 2019  . Pneumonia   . Pulmonary nodule    Solid 80mm Right Lower Lobe  . Right renal mass 04/10/2018   new dx--  scheduled for nephrectomy 05-16-2018  . S/P ureteral stent placement: 04/16/2018 04/19/2018   Past Surgical History:  Procedure Laterality Date  . AV FISTULA PLACEMENT Left 05/07/2018   Procedure: Creation of Left arm Radiocephalic Fistula;  Surgeon: Marty Heck, MD;  Location: [place];  Service: Vascular;  Laterality: Left;  . CYSTOSCOPY/RETROGRADE/URETEROSCOPY/STONE EXTRACTION WITH BASKET  x2 last one 1990s approx.  . CYSTOSCOPY/URETEROSCOPY/HOLMIUM LASER/STENT PLACEMENT Left 04/16/2018   Procedure: CYSTOSCOPY/LEFT URETEROSCOPY/LEFT RETROGRADE/STENT PLACEMENT;  Surgeon: Lucas Mallow, MD;  Location: [facility];  Service: Urology;  Laterality: Left;  .  EUS N/A 05/03/2018   Procedure: UPPER ENDOSCOPIC ULTRASOUND (EUS) RADIAL;  Surgeon: Milus Banister, MD;  Location: [facility];  Service: Gastroenterology;  Laterality: N/A;  . EUS N/A 05/03/2018   Procedure: UPPER ENDOSCOPIC ULTRASOUND (EUS) LINEAR;  Surgeon: Milus Banister, MD;  Location: [facility];  Service:  Gastroenterology;  Laterality: N/A;  . EXTRACORPOREAL SHOCK WAVE LITHOTRIPSY  x3  last one 2004 approx.  Marland Kitchen FINE NEEDLE ASPIRATION N/A 05/03/2018   Procedure: FINE NEEDLE ASPIRATION (FNA) LINEAR;  Surgeon: Milus Banister, MD;  Location: [facility];  Service: Gastroenterology;  Laterality: N/A;  . LAPAROSCOPIC NEPHRECTOMY, HAND ASSISTED Right 05/16/2018   Procedure: HAND ASSISTED LAPAROSCOPIC RIGHT NEPHRECTOMY;  Surgeon: Lucas Mallow, MD;  Location: [facility];  Service: Urology;  Laterality: Right;  . left index finger attachment  1980s   3-4 surgeries  . TOE SURGERY  1980s   in beween 2nd and 3rd toes cyst removed     A IV Location/Drains/Wounds Patient Lines/Drains/Airways Status   Active Line/Drains/Airways    Name:   Placement date:   Placement time:   Site:   Days:   Peripheral IV 03/06/19 Right Hand   03/06/19    0819    Hand   less than 1   Fistula / Graft Left Forearm Arteriovenous fistula   05/07/18    0811    Forearm   303   Ureteral Drain/Stent Left ureter 6 Fr.   04/16/18    1258    Left ureter   324   Incision (Closed) 05/07/18 Arm Left   05/07/18    0809     303   Incision (Closed) 05/16/18 Abdomen   05/16/18    1045     294   Incision - 3 Ports Abdomen 1: Right;Superior 2: Right;Upper 3: Right;Lateral;Lower   05/16/18    0930     294          Intake/Output Last 24 hours No intake or output data in the 24 hours ending 03/06/19 1143  Labs/Imaging Results for orders placed or performed during the hospital encounter of 03/06/19 (from the past 48 hour(s))  CBC     Status: Abnormal   Collection Time: 03/06/19  5:55 AM  Result Value Ref Range   WBC 12.2 (H) 4.0 - 10.5 K/uL   RBC 4.49 4.22 - 5.81 MIL/uL   Hemoglobin 12.3 (L) 13.0 - 17.0 g/dL   HCT 39.2 39.0 - 52.0 %   MCV 87.3 80.0 - 100.0 fL   MCH 27.4 26.0 - 34.0 pg   MCHC 31.4 30.0 - 36.0 g/dL   RDW 14.8 11.5 - 15.5 %   Platelets 252 150 - 400 K/uL   nRBC 0.0 0.0 - 0.2 %    Comment: Performed at [facility], [place] [address], [place], [place] [address]  Brain natriuretic peptide     Status: None   Collection Time: 03/06/19  5:55 AM  Result Value Ref Range   B Natriuretic Peptide 34.8 0.0 - 100.0 pg/mL    Comment: Performed at [facility] [address], [place],  [address]  POC CBG, ED     Status: None   Collection Time: 03/06/19  6:33 AM  Result Value Ref Range   Glucose-Capillary 95 70 - 99 mg/dL  D-dimer, quantitative (not at [facility])     Status: Abnormal   Collection Time: 03/06/19  6:34 AM  Result Value Ref Range   D-Dimer, [place] Brown  0.82 (H) 0.00 - 0.50 ug/mL-FEU    Comment: (NOTE) At the manufacturer cut-off of 0.50 ug/mL FEU, this assay has been documented to exclude PE with a sensitivity and negative predictive value of 97 to 99%.  At this time, this assay has not been approved by the FDA to exclude DVT/VTE. Results should be correlated with clinical presentation. Performed at [facility], [place] [address], [place], [place] [address]   Comprehensive metabolic panel     Status: Abnormal   Collection Time: 03/06/19  6:34 AM  Result Value Ref Range   Sodium 136 135 - 145 mmol/L   Potassium 4.7 3.5 - 5.1 mmol/L   Chloride 102 98 - 111 mmol/L   CO2 20 (L) 22 - 32 mmol/L   Glucose, Bld 101 (H) 70 - 99 mg/dL   BUN 56 (H) 6 - 20 mg/dL   Creatinine, Ser 4.54 (H) 0.61 - 1.24 mg/dL   Calcium 9.0 8.9 - 10.3 mg/dL   Total Protein 7.6 6.5 - 8.1 g/dL   Albumin 3.7 3.5 - 5.0 g/dL   AST 17 15 - 41 U/L   ALT 11 0 - 44 U/L   Alkaline Phosphatase 98 38 - 126 U/L   Total Bilirubin 0.7 0.3 - 1.2 mg/dL   GFR calc non Af Amer 13 (L) >60 mL/min   GFR calc Af Amer 15 (L) >60 mL/min   Anion gap 14 5 - 15    Comment: Performed at [facility] [address], [place], [place] [address]  Lipase, blood     Status: None   Collection Time: 03/06/19  6:34 AM  Result Value Ref Range   Lipase 32 11 - 51 U/L    Comment: Performed at [facility] [address]  [place], [place] [address]  Troponin I (High Sensitivity)     Status: None   Collection Time: 03/06/19  6:34 AM  Result Value Ref Range   Troponin I (High Sensitivity) 9 <18 ng/L    Comment: (NOTE) Elevated high sensitivity troponin I (hsTnI) values and significant  changes across serial measurements may suggest ACS but many other  chronic and acute conditions are known to elevate hsTnI results.  Refer to the "Links" section for chest pain algorithms and additional  guidance. Performed at [facility], [place] [address] [place], [place] [address]   Troponin I (High Sensitivity)     Status: None   Collection Time: 03/06/19  7:31 AM  Result Value Ref Range   Troponin I (High Sensitivity) 7 <18 ng/L    Comment: (NOTE) Elevated high sensitivity troponin I (hsTnI) values and significant  changes across serial measurements may suggest ACS but many other  chronic and acute conditions are known to elevate hsTnI results.  Refer to the "Links" section for chest pain algorithms and additional  guidance. Performed at [facility], [place] [address] [place],  [address]   SARS Coronavirus 2 (CEPHEID - Performed in [facility] lab), Hosp Order     Status: None   Collection Time: 03/06/19  7:33 AM   Specimen: Nasopharyngeal Swab  Result Value Ref Range   SARS Coronavirus 2 NEGATIVE NEGATIVE    Comment: (NOTE) If result is NEGATIVE SARS-CoV-2 target nucleic acids are NOT DETECTED. The SARS-CoV-2 RNA is generally detectable in upper and lower  respiratory specimens during the acute phase of infection. The lowest  concentration of SARS-CoV-2 viral copies this assay can detect is  250  copies / mL. A negative result does not preclude SARS-CoV-2 infection  and should not be used as the sole basis for treatment or other  patient management decisions.  A negative result may occur with  improper specimen collection / handling, submission of specimen other  than nasopharyngeal swab,  presence of viral mutation(s) within the  areas targeted by this assay, and inadequate number of viral copies  (<250 copies / mL). A negative result must be combined with clinical  observations, patient history, and epidemiological information. If result is POSITIVE SARS-CoV-2 target nucleic acids are DETECTED. The SARS-CoV-2 RNA is generally detectable in upper and lower  respiratory specimens dur ing the acute phase of infection.  Positive  results are indicative of active infection with SARS-CoV-2.  Clinical  correlation with patient history and other diagnostic information is  necessary to determine patient infection status.  Positive results do  not rule out bacterial infection or co-infection with other viruses. If result is PRESUMPTIVE POSTIVE SARS-CoV-2 nucleic acids MAY BE PRESENT.   A presumptive positive result was obtained on the submitted specimen  and confirmed on repeat testing.  While 2019 novel coronavirus  (SARS-CoV-2) nucleic acids may be present in the submitted sample  additional confirmatory testing may be necessary for epidemiological  and / or clinical management purposes  to differentiate between  SARS-CoV-2 and other Sarbecovirus currently known to infect humans.  If clinically indicated additional testing with an alternate test  methodology [phone]) is advised. The SARS-CoV-2 RNA is generally  detectable in upper and lower respiratory sp ecimens during the acute  phase of infection. The expected result is Negative. Fact Sheet for Patients:  StrictlyIdeas.no Fact Sheet for Healthcare Providers: BankingDealers.co.za This test is not yet approved or cleared by the [place] FDA and has been authorized for detection and/or diagnosis of SARS-CoV-2 by FDA under an Emergency Use Authorization (EUA).  This EUA will remain in effect (meaning this test can be used) for the duration of the COVID-19 declaration under  Section 564(b)(1) of the Act, 21 U.S.C. section 360bbb-3(b)(1), unless the authorization is terminated or revoked sooner. Performed at [facility], [place] [address], [place], [place] [address]   Troponin I (High Sensitivity)     Status: None   Collection Time: 03/06/19 10:05 AM  Result Value Ref Range   Troponin I (High Sensitivity) 6 <18 ng/L    Comment: (NOTE) Elevated high sensitivity troponin I (hsTnI) values and significant  changes across serial measurements may suggest ACS but many other  chronic and acute conditions are known to elevate hsTnI results.  Refer to the "Links" section for chest pain algorithms and additional  guidance. Performed at [facility], [place] [address] [place], [place] [address]    Dg Chest 2 View  Result Date: 03/06/2019 CLINICAL DATA:  Anterior chest pain. Pain woke the patient from sleep. Pain is worse with a deep breath. Initial encounter. EXAM: CHEST - 2 VIEW COMPARISON:  One-view chest x-ray 12/19/2018 FINDINGS: The heart size is exaggerated by low lung volumes. Mild bibasilar airspace opacities likely reflect atelectasis. No other significant consolidation is present. There is no edema or effusion. The visualized soft tissues and bony thorax are unremarkable. IMPRESSION: 1. Mild bibasilar opacities in the setting of low lung volumes likely reflect atelectasis. 2. Borderline cardiomegaly without failure.  If Electronically Signed   By: San Morelle M.D.   On: 03/06/2019 06:35    Pending Labs Unresulted Labs (From admission, onward)  None      Vitals/Pain Today's Vitals   03/06/19 1045 03/06/19 1100 03/06/19 1115 03/06/19 1130  BP: 120/77 127/78 130/78 131/80  Pulse: 96 94 96 91  Resp:      SpO2: 99% 99% 100% 99%  Weight:      Height:      PainSc:        Isolation Precautions No active isolations  Medications Medications  nitroGLYCERIN (NITROSTAT) SL tablet 0.4 mg (has no administration in time range)  allopurinol  (ZYLOPRIM) tablet 100 mg (has no administration in time range)  atorvastatin (LIPITOR) tablet 20 mg (has no administration in time range)  furosemide (LASIX) tablet 40 mg (has no administration in time range)  metoprolol tartrate (LOPRESSOR) tablet 25 mg (has no administration in time range)  insulin glargine (LANTUS) injection 26 Units (has no administration in time range)  omeprazole (PRILOSEC OTC) EC tablet 20 mg (has no administration in time range)  sodium bicarbonate tablet 650 mg (has no administration in time range)  Potassium Citrate TBCR 1 tablet (has no administration in time range)  tamsulosin (FLOMAX) capsule 0.4 mg (has no administration in time range)  loratadine (CLARITIN) tablet 10 mg (has no administration in time range)  zolpidem (AMBIEN) tablet 5 mg (has no administration in time range)  sorbitol 70 % solution 30 mL (has no administration in time range)  docusate sodium (ENEMEEZ) enema 283 mg (has no administration in time range)  camphor-menthol (SARNA) lotion 1 application (has no administration in time range)    And  hydrOXYzine (ATARAX/VISTARIL) tablet 25 mg (has no administration in time range)  calcium carbonate (dosed in mg elemental calcium) suspension 500 mg of elemental calcium (has no administration in time range)  feeding supplement (NEPRO CARB STEADY) liquid 237 mL (has no administration in time range)  enoxaparin (LOVENOX) injection 40 mg (has no administration in time range)  acetaminophen (TYLENOL) tablet 650 mg (has no administration in time range)  ondansetron (ZOFRAN) injection 4 mg (has no administration in time range)  insulin aspart (novoLOG) injection 0-20 Units (has no administration in time range)  insulin aspart (novoLOG) injection 0-5 Units (has no administration in time range)  sodium chloride flush (NS) 0.9 % injection 3 mL (3 mLs Intravenous Given 03/06/19 0820)  alum & mag hydroxide-simeth (MAALOX/MYLANTA) 200-200-20 MG/5ML suspension 30 mL (30  mLs Oral Given 03/06/19 1749)  fentaNYL (SUBLIMAZE) injection 50 mcg (50 mcg Intravenous Given 03/06/19 0919)    Mobility walks Low fall risk   Focused Assessments    R Recommendations: See Admitting Provider Note  Report given to: Charlena Cross RN  Additional Notes:

## 2019-03-06 NOTE — Plan of Care (Signed)
Patient having no c/o chest pain or discomfort at this time, will continue to monitor.

## 2019-03-06 NOTE — ED Notes (Signed)
RN was told pt was sent to Xray, will be transported back to room

## 2019-03-06 NOTE — Consult Note (Addendum)
Cardiology Consultation:   [name]. MRN: [id number]; DOB: [birth year]  Admit date: 03/06/2019 Date of Consult: 03/06/2019  Primary Care Provider: Esaw Grandchild, NP Primary Cardiologist: Elouise Munroe, MD  Primary Electrophysiologist:  None    Patient Profile:   George Hodes. is a 63 y.o. 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 Lorin Mercy.   Cardiac History includes hospital visit 10//2019 with George Lewis for syncope work-up following right nephrectomy. Symptoms were thought to be vasovagal at the time secondary to pain and dehydration. Echo 04/2018 LVEF 50-55%. Patient was noted to have 12-beat run NSVT and PVCS. Patient was asymptomatic. Patient did not need follow up outpatient.  Patients PCP recently ordered an echo for SVT that showed LVEF 60-65% with some diastolic dysfunction.    History of Present Illness:   George Lewis presented to the ER with anterior chest pain that awoke patient from sleep this morning. Pain is sharp, 8/10 and radiating to the back. Pain is worse with taking a deep breath and ambulation. Has associated sob. Denies n/v/diaphoresis. Pain lasted about 30 minutes. Patient took tylenol for the pain and had improved upon arrival to the ER.    In the ER- Trop 9>7>6. BNP 34.8. HMG 12.3, Cr 4.56 (up from 4.14 6/29). Electrolytes wnl. CXR showed Mild bibasilar opacities in the setting of low lung volumes likely reflect atelectasis and borderline cardiomegaly without failure. EKG Sinus tachycardia, HR 110 with no acute ST/T wave changes. D-dimer slightly elevated, 0.82. V/Q negative for PE.COVID negative. Received GI cocktail without relief. Admitted for rule out of ACS.   Patient denies alcohol, drug, tobacco  use. Family history positive for Father with MI in 15s and bypass surgery. Patient still having intermittent chest pain 5/10 with inspiration and ambulation. Otherwise asymptomatic. Patient has not had a cath before. Last stress test was over 20 years ago.   Heart Pathway Score:     Past Medical History:  Diagnosis Date  . Arthritis    knees  . Bilateral renal cysts 03/23/2018   Noted MRI ABD  . Cholelithiasis 03/19/2018   noted on CT AB/Pelvis  . CKD (chronic kidney disease), stage IV [facility])    nephrologist-- dr Hollie Salk [name]);  05-01-2018 has not started dialysis, scheduled for AV fistula creation 05-07-2018  . Diverticulosis of colon 04/19/2018   Noted on CT abd/pelvis  . GERD (gastroesophageal reflux disease)   . Hepatic steatosis 03/19/2018   Mild diffuse, noted on CT AB/Pelvis  . History of kidney stones   . History of pulmonary embolus (PE) 03/2008   treated with coumadin for 6 months  . History of sepsis 04/20/2018   per discharge note , probable UTI  . Hypertension   . Nocturia   . OSA on CPAP   . Pancreatic lesion    0.4cm cystic per CT 07/ 2019  . Pneumonia   . Pulmonary nodule    Solid 66m Right Lower Lobe  . Right renal mass 04/10/2018   new dx--  scheduled for nephrectomy 05-16-2018  . S/P ureteral stent placement: 04/16/2018 04/19/2018    Past Surgical History:  Procedure Laterality Date  . AV FISTULA PLACEMENT Left 05/07/2018   Procedure: Creation of  Left arm Radiocephalic Fistula;  Surgeon: Marty Heck, MD;  Location: [facility];  Service: Vascular;  Laterality: Left;  . CYSTOSCOPY/RETROGRADE/URETEROSCOPY/STONE EXTRACTION WITH BASKET  x2 last one 1990s approx.  . CYSTOSCOPY/URETEROSCOPY/HOLMIUM LASER/STENT PLACEMENT Left 04/16/2018   Procedure: CYSTOSCOPY/LEFT URETEROSCOPY/LEFT RETROGRADE/STENT PLACEMENT;  Surgeon: Lucas Mallow, MD;  Location: [facility];  Service: Urology;  Laterality: Left;  . EUS N/A 05/03/2018   Procedure: UPPER ENDOSCOPIC  ULTRASOUND (EUS) RADIAL;  Surgeon: Milus Banister, MD;  Location: [facility];  Service: Gastroenterology;  Laterality: N/A;  . EUS N/A 05/03/2018   Procedure: UPPER ENDOSCOPIC ULTRASOUND (EUS) LINEAR;  Surgeon: Milus Banister, MD;  Location: [facility];  Service: Gastroenterology;  Laterality: N/A;  . EXTRACORPOREAL SHOCK WAVE LITHOTRIPSY  x3  last one 2004 approx.  Marland Kitchen FINE NEEDLE ASPIRATION N/A 05/03/2018   Procedure: FINE NEEDLE ASPIRATION (FNA) LINEAR;  Surgeon: Milus Banister, MD;  Location: [facility];  Service: Gastroenterology;  Laterality: N/A;  . LAPAROSCOPIC NEPHRECTOMY, HAND ASSISTED Right 05/16/2018   Procedure: HAND ASSISTED LAPAROSCOPIC RIGHT NEPHRECTOMY;  Surgeon: Lucas Mallow, MD;  Location: [facility];  Service: Urology;  Laterality: Right;  . left index finger attachment  1980s   3-4 surgeries  . TOE SURGERY  1980s   in beween 2nd and 3rd toes cyst removed     Home Medications:  Prior to Admission medications   Medication Sig Start Date End Date Taking? Authorizing Provider  acetaminophen (TYLENOL) 325 MG tablet Take 650 mg by mouth every 6 (six) hours as needed for mild pain.   Yes [provider]  allopurinol (ZYLOPRIM) 100 MG tablet Take 1 tablet (100 mg total) by mouth every evening. 12/22/18  Yes Danford, Suann Larry, MD  atorvastatin (LIPITOR) 20 MG tablet Take 1 tablet (20 mg total) by mouth daily. 02/27/19  Yes Danford, Berna Spare, NP  blood glucose meter kit and supplies Dispense based on patient and insurance preference. Use up to four times daily as directed. (FOR ICD-10 E10.9, E11.9). 12/22/18  Yes Danford, Suann Larry, MD  Continuous Blood Gluc Sensor (FREESTYLE LIBRE 14 DAY SENSOR) MISC 1 each by Does not apply route every 14 (fourteen) days. Change every 2 weeks 02/11/19  Yes Philemon Kingdom, MD  furosemide (LASIX) 40 MG tablet Take 1 tablet by mouth daily. 09/17/18  Yes [provider]  insulin glargine (LANTUS) 100 UNIT/ML injection  Inject 0.32 mLs (32 Units total) into the skin at bedtime. Patient taking differently: Inject 26 Units into the skin at bedtime.  02/11/19  Yes Philemon Kingdom, MD  insulin lispro (HUMALOG) 100 UNIT/ML cartridge Inject 0.06-0.08 mLs (6-8 Units total) into the skin 3 (three) times daily with meals. Per sliding scale. Patient taking differently: Inject 6 Units into the skin 3 (three) times daily with meals.  02/11/19  Yes Philemon Kingdom, MD  loratadine (CLARITIN) 10 MG tablet Take 10 mg by mouth every evening.    Yes [provider]  metoprolol tartrate (LOPRESSOR) 25 MG tablet Take 25 mg by mouth daily.   Yes [provider]  omeprazole (PRILOSEC OTC) 20 MG tablet Take 20 mg by mouth daily.   Yes [provider]  Potassium Citrate 15 MEQ (1620 MG) TBCR Take 1 tablet by mouth every evening.  03/22/18  Yes [provider]  sodium bicarbonate 650 MG tablet Take 650 mg by mouth daily.   Yes [provider]  tamsulosin (FLOMAX) 0.4 MG CAPS capsule Take 0.4 mg by mouth every evening.  03/08/18  Yes [provider]    Inpatient Medications: Scheduled Meds: . allopurinol  100 mg Oral QPM  . atorvastatin  20 mg Oral Daily  . enoxaparin (LOVENOX) injection  30 mg Subcutaneous Q24H  . furosemide  40 mg Oral Daily  . insulin aspart  0-20 Units Subcutaneous TID WC  . insulin aspart  0-5 Units Subcutaneous QHS  . insulin glargine  26 Units Subcutaneous QHS  . loratadine  10 mg Oral QPM  . metoprolol tartrate  25 mg Oral Daily  . potassium citrate  10 mEq Oral QPM  . sodium bicarbonate  650 mg Oral Daily  . tamsulosin  0.4 mg Oral QPM   Continuous Infusions:  PRN Meds: acetaminophen, calcium carbonate (dosed in mg elemental calcium), camphor-menthol **AND** hydrOXYzine, docusate sodium, feeding supplement (NEPRO CARB STEADY), nitroGLYCERIN, ondansetron (ZOFRAN) IV, sorbitol, zolpidem  Allergies:    Allergies  Allergen Reactions  . Penicillins  Rash and Other (See Comments)    Has patient had a PCN reaction causing immediate rash, facial/tongue/throat swelling, SOB or lightheadedness with hypotension: yes Has patient had a PCN reaction causing severe rash involving mucus membranes or skin necrosis: no Has patient had a PCN reaction that required hospitalization: no Has patient had a PCN reaction occurring within the last 10 years: no If all of the above answers are "NO", then may proceed with Cephalosporin use.     Social History:   Social History   Socioeconomic History  . Marital status: Married    Spouse name: Not on file  . Number of children: Not on file  . Years of education: Not on file  . Highest education level: Not on file  Occupational History  . Occupation: disabled  Social Needs  . Financial resource strain: Patient refused  . Food insecurity    Worry: Never true    Inability: Never true  . Transportation needs    Medical: No    Non-medical: No  Tobacco Use  . Smoking status: Never Smoker  . Smokeless tobacco: Never Used  Substance and Sexual Activity  . Alcohol use: Never    Frequency: Never  . Drug use: Never  . Sexual activity: Not on file  Lifestyle  . Physical activity    Days per week: 0 days    Minutes per session: 0 min  . Stress: Not on file  Relationships  . Social connections    Talks on phone: More than three times a week    Gets together: Never    Attends religious service: More than 4 times per year    Active member of club or organization: No    Attends meetings of clubs or organizations: Never    Relationship status: Married  . Intimate partner violence    Fear of current or ex partner: Not on file    Emotionally abused: Not on file    Physically abused: Not on file    Forced sexual activity: Not on file  Other Topics Concern  . Not on file  Social History Narrative  . Not on file    Family History:   Family History  Problem Relation Age of Onset  . Alzheimer's  disease Mother   . Alzheimer's disease Father   . Heart disease Father   . Heart attack Father   . Cancer - Other Sister      ROS:  Please see the history of present illness.  All other ROS reviewed and negative.  Physical Exam/Data:   Vitals:   03/06/19 1130 03/06/19 1231 03/06/19 1240 03/06/19 1346  BP: 131/80  95/65   Pulse: 91 (!) 112 (!) 107 (!) 118  Resp:  18    Temp:  98.3 F (36.8 C)    TempSrc:  Oral    SpO2: 99% 100%    Weight:   132.8 kg   Height:   6' 0.99" (1.854 m)     Intake/Output Summary (Last 24 hours) at 03/06/2019 1618 Last data filed at 03/06/2019 1300 Gross per 24 hour  Intake 240 ml  Output -  Net 240 ml   Last 3 Weights 03/06/2019 03/06/2019 02/25/2019  Weight (lbs) 292 lb 12.3 oz 301 lb 301 lb 4.8 oz  Weight (kg) 132.8 kg 136.533 kg 136.669 kg     Body mass index is 38.63 kg/m.  General:  Well nourished, well developed, in no acute distress HEENT: normal Neck: no JVD Endocrine:  No thryomegaly Vascular: No carotid bruits; FA pulses 2+ bilaterally without bruits  Cardiac:  normal S1, S2; RRR; no murmur  Lungs:  clear to auscultation bilaterally, no wheezing, rhonchi or rales  Abd: soft, nontender, no hepatomegaly  Ext: no edema Musculoskeletal:  No deformities, BUE and BLE strength normal and equal Skin: warm and dry  Neuro:  CNs 2-12 intact, no focal abnormalities noted Psych:  Normal affect   EKG:  The EKG was personally reviewed and demonstrates: Sinus Tachycardia, HR 110, no ST/T wave changes, poor R wave progression Telemetry:  Telemetry was personally reviewed and demonstrates:  NSR HR 80-90s with runs of Sinus tachycardia 100-110s  Relevant CV Studies:  Echo 02/26/2019  1. The left ventricle has normal systolic function with an ejection fraction of 60-65%. The cavity size was normal. Left ventricular diastolic Doppler parameters are consistent with impaired relaxation.  2. The right ventricle has normal systolic function. The cavity  was normal. There is no increase in right ventricular wall thickness.  3. Left atrial size was mildly dilated.  4. The aortic valve is tricuspid. Mild thickening of the aortic valve. Mild calcification of the aortic valve.  Laboratory Data:  High Sensitivity Troponin:   Recent Labs  Lab 03/06/19 0634 03/06/19 0731 03/06/19 1005  TROPONINIHS _0 Cardiac EnzymesNo results for input(s): TROPONINI in the last 168 hours. No results for input(s): TROPIPOC in the last 168 hours.  Chemistry Recent Labs  Lab 03/06/19 0634  NA 136  K 4.7  CL 102  CO2 20*  GLUCOSE 101*  BUN 56*  CREATININE 4.54*  CALCIUM 9.0  GFRNONAA 13*  GFRAA 15*  ANIONGAP 14    Recent Labs  Lab 03/06/19 0634  PROT 7.6  ALBUMIN 3.7  AST 17  ALT 11  ALKPHOS 98  BILITOT 0.7   Hematology Recent Labs  Lab 03/06/19 0555  WBC 12.2*  RBC 4.49  HGB 12.3*  HCT 39.2  MCV 87.3  MCH 27.4  MCHC 31.4  RDW 14.8  PLT 252   BNP Recent Labs  Lab 03/06/19 0555  BNP 34.8    DDimer  Recent Labs  Lab 03/06/19 0634  DDIMER 0.82*     Radiology/Studies:  Dg Chest 2 View  Result Date: 03/06/2019 CLINICAL DATA:  Anterior chest pain. Pain woke the patient from sleep. Pain is worse with a deep breath. Initial encounter. EXAM: CHEST - 2 VIEW COMPARISON:  One-view chest x-ray 12/19/2018 FINDINGS: The heart size is exaggerated by low lung volumes. Mild bibasilar  airspace opacities likely reflect atelectasis. No other significant consolidation is present. There is no edema or effusion. The visualized soft tissues and bony thorax are unremarkable. IMPRESSION: 1. Mild bibasilar opacities in the setting of low lung volumes likely reflect atelectasis. 2. Borderline cardiomegaly without failure.  If Electronically Signed   By: San Morelle M.D.   On: 03/06/2019 06:35   Nm Pulmonary Perfusion  Result Date: 03/06/2019 CLINICAL DATA:  Elevated D-dimer. Chest pain which radiates to back. Shortness of breath.  EXAM: NUCLEAR MEDICINE PERFUSION LUNG SCAN TECHNIQUE: Perfusion images were obtained in multiple projections after intravenous injection of radiopharmaceutical. Ventilation scans intentionally deferred if perfusion scan and chest x-ray adequate for interpretation during COVID 19 epidemic. RADIOPHARMACEUTICALS:  1.4 mCi Tc-32mMAA IV COMPARISON:  Chest x-ray 03/06/2019 FINDINGS: There are no lobar, segmental or subsegmental perfusion defects identified to suggest pulmonary embolus. IMPRESSION: 1. Negative exam. No perfusion defects identified suggestive of acute pulmonary embolus. Electronically Signed   By: TKerby MoorsM.D.   On: 03/06/2019 15:06    Assessment and Plan:   1. Chest pain - Patient with anterior chest pain that awoke patient with radiation into back, worse with inspiration and ambulation. He has not had an extensive cardiac work-up in the past - In the ER Trop normal, 9>7>6 - HMG and electrolytes wnl -CXR unremarkable -EKG with no signs of acute ischemia - It has been noted patient has had asymptomatic runs of SVT. Recent echo 6/30 showed  60-65% with some diastolic dysfunction.  -After discussing no further work-up will be pursued. HEAR score 3, Delta troponin less than 1. Based on guidelines, likely non-cardiac.  -Recommend aggressive lifestyle modifications  2. HTN -Coreg, home med -Elevated in the ER -Lisinopril 11mand hydralazine for better control  3. Hyperlpipdemia -Lipitor 2049mhome med -02/25/19 LDL 131, goal <70 - IM Increased to Lipitor 41m51m4. DMII, uncontrolled - A1C 7.4 on 6/15 -per IM  5. OSA on CPAP -per IM  6. CKD IV -Not on HD, but has a fistula -per nephrology  For questions or updates, please contact [email or web address] consult [email or web address] for contact info under    Signed, Cadence H FuNinfa Meeker-C  03/06/2019 4:18 PM   History and all data above reviewed.  Patient examined.  I agree with the findings as above.  The patient has no  prior cardiac history though does have risk factors.  Hypertension previous recent onset diabetes.  He has not had chest discomfort before.  Has had some palpitations in the past.  He came today because he developed sharp stinging discomfort in his chest.  This is new.  Never had this before.  There is no radiation to his jaw or to his arms.  It hurts to move in certain way or to take a deep breath.  He did not notice this with any palpitations.  And work-up as above in the emergency room is not found to have pulmonary embolism.  He is Hx troponin was as above with a negative delta.  This is despite persistent symptoms for several hours.  The HEAR score is 3.  EKG demonstrates no acute findings.  Poor anterior R wave progression.  The patient exam reveals COR:RRR  ,  Lungs: Clear  ,  Abd: Positive bowel sounds, no rebound no guarding, Ext No edema  .  All available labs, radiology testing, previous records reviewed. Agree with documented assessment and plan.  Chest pain: Given the at bedtime troponin very low  posttest probability that this is chest pain related to obstructive coronary disease and acute coronary syndrome.  Given that no further work-up is indicated.  This would be chest pain related to a non-anginal/acute coronary etiology.  I will defer to the primary team to consider other noncardiac etiologies.  Certainly he has cardiovascular risk factors and should have aggressive primary risk reduction.  Arrhythmia: He has sinus tachycardia.  He has sinus tach and I reviewed telemetry did not see evidence of an automatic arrhythmia.  No further evaluation is warranted. Jeneen Rinks Twylla Arceneaux  5:47 PM  03/06/2019

## 2019-03-06 NOTE — ED Provider Notes (Signed)
I received pt in signout from Dr. Leonette Monarch.  Briefly, he had presented with a few hours of substernal chest pain radiating to the back that woke him from sleep.  No recent illness and COVID-19 negative.  At time of signout, awaiting completion of lab work. Dr. Leonette Monarch recommended VQ scan for elevated d-dimer and ultimately felt pt needed admission for ACS rule out due to HEART score of 5.  D-dimer elevated; V/Q scan ordered. Trop negative however given elevated HEART score and cardiac risk factors, discussed admission with [facility], Dr. Lorin Mercy, and pt admitted for further care.   Iliyana Convey, Wenda Overland, MD 03/06/19 [id number]

## 2019-03-06 NOTE — ED Provider Notes (Signed)
[facility] EMERGENCY DEPARTMENT Provider Note  CSN: [id number] Arrival date & time: 03/06/19 0533  Chief Complaint(s) Chest Pain  HPI George Lewis. is a 63 y.o. male with a past medical history listed below who presents to the emergency department with 2 hours of substernal chest pain rating into the back.  He reports that the pain woke him while asleep.  Pain is nonexertional.  Pain also worse with deep breathing.  No recent fevers or infections.  No nausea or emesis.  No abdominal pain.  No diarrhea.  No cough or congestion.  He is endorsing mild shortness of breath.  Denies any prior History of CAD.  States that his last stress test was more than 10 years ago.  Patient never had a catheterization before.  He does have a history of prior pulmonary embolism but reports this pain is different.   HPI  Past Medical History Past Medical History:  Diagnosis Date  . Arthritis    knees  . Bilateral renal cysts 03/23/2018   Noted MRI ABD  . Cholelithiasis 03/19/2018   noted on CT AB/Pelvis  . CKD (chronic kidney disease), stage IV [facility])    nephrologist-- dr Hollie Salk [name]);  05-01-2018 has not started dialysis, scheduled for AV fistula creation 05-07-2018  . Diverticulosis of colon 04/19/2018   Noted on CT abd/pelvis  . GERD (gastroesophageal reflux disease)   . Hepatic steatosis 03/19/2018   Mild diffuse, noted on CT AB/Pelvis  . History of kidney stones   . History of pulmonary embolus (PE) 03/2008   treated with coumadin for 6 months  . History of sepsis 04/20/2018   per discharge note , probable UTI  . Hypertension   . Nocturia   . OSA on CPAP   . Pancreatic lesion    0.4cm cystic per CT 07/ 2019  . Pneumonia   . Pulmonary nodule    Solid 34m Right Lower Lobe  . Right renal mass 04/10/2018   new dx--  scheduled for nephrectomy 05-16-2018  . S/P ureteral stent placement: 04/16/2018 04/19/2018   Patient Active Problem List   Diagnosis Date  Noted  . Healthcare maintenance 01/28/2019  . DKA (diabetic ketoacidoses) ([facility] 12/19/2018  . Newly diagnosed diabetes ([facility] 12/19/2018  . Syncope 06/12/2018  . Ileus ([place]   . SVT (supraventricular tachycardia) ([place]   . Renal mass, right 05/16/2018  . Mass of pancreas   . Abnormal CT of the abdomen   . Bacteremia due to Enterococcus 04/23/2018  . Sepsis ([facility] 04/19/2018  . Acute renal failure superimposed on stage 4 chronic kidney disease ([facility] [place] 04/19/2018  . Dehydration 04/19/2018  . UTI (urinary tract infection): Probable 04/19/2018  . Leukocytosis 04/19/2018  . Anemia 04/19/2018  . Renal cell carcinoma, right ([facility] 04/19/2018  . S/P ureteral stent placement: 04/16/2018 04/19/2018  . GERD (gastroesophageal reflux disease) 04/19/2018  . OSA on CPAP 04/19/2018  . HTN (hypertension) 04/19/2018   Home Medication(s) Prior to Admission medications   Medication Sig Start Date End Date Taking? Authorizing Provider  acetaminophen (TYLENOL) 325 MG tablet Take 650 mg by mouth every 6 (six) hours as needed for mild pain.   Yes [provider]  allopurinol (ZYLOPRIM) 100 MG tablet Take 1 tablet (100 mg total) by mouth every evening. 12/22/18  Yes Danford, CSuann Larry MD  atorvastatin (LIPITOR) 20 MG tablet Take 1 tablet (20 mg total) by mouth daily. 02/27/19  Yes Danford, KValetta FullerD, NP  blood glucose meter kit and  supplies Dispense based on patient and insurance preference. Use up to four times daily as directed. (FOR ICD-10 E10.9, E11.9). 12/22/18  Yes Danford, Suann Larry, MD  Continuous Blood Gluc Sensor (FREESTYLE LIBRE 14 DAY SENSOR) MISC 1 each by Does not apply route every 14 (fourteen) days. Change every 2 weeks 02/11/19  Yes Philemon Kingdom, MD  furosemide (LASIX) 40 MG tablet Take 1 tablet by mouth daily. 09/17/18  Yes [provider]  insulin glargine (LANTUS) 100 UNIT/ML injection Inject 0.32 mLs (32 Units total) into the skin at bedtime. Patient taking differently:  Inject 26 Units into the skin at bedtime.  02/11/19  Yes Philemon Kingdom, MD  insulin lispro (HUMALOG) 100 UNIT/ML cartridge Inject 0.06-0.08 mLs (6-8 Units total) into the skin 3 (three) times daily with meals. Per sliding scale. Patient taking differently: Inject 6 Units into the skin 3 (three) times daily with meals.  02/11/19  Yes Philemon Kingdom, MD  loratadine (CLARITIN) 10 MG tablet Take 10 mg by mouth every evening.    Yes [provider]  metoprolol tartrate (LOPRESSOR) 25 MG tablet Take 25 mg by mouth daily.   Yes [provider]  omeprazole (PRILOSEC OTC) 20 MG tablet Take 20 mg by mouth daily.   Yes [provider]  Potassium Citrate 15 MEQ (1620 MG) TBCR Take 1 tablet by mouth every evening.  03/22/18  Yes [provider]  sodium bicarbonate 650 MG tablet Take 650 mg by mouth daily.   Yes [provider]  tamsulosin (FLOMAX) 0.4 MG CAPS capsule Take 0.4 mg by mouth every evening.  03/08/18  Yes [provider]  senna-docusate (SENOKOT-S) 8.6-50 MG tablet Take 1 tablet by mouth daily. Patient not taking: Reported on 03/06/2019 05/21/18   Fredricka Bonine, MD                                                                                                                                    Past Surgical History Past Surgical History:  Procedure Laterality Date  . AV FISTULA PLACEMENT Left 05/07/2018   Procedure: Creation of Left arm Radiocephalic Fistula;  Surgeon: Marty Heck, MD;  Location: [place];  Service: Vascular;  Laterality: Left;  . CYSTOSCOPY/RETROGRADE/URETEROSCOPY/STONE EXTRACTION WITH BASKET  x2 last one 1990s approx.  . CYSTOSCOPY/URETEROSCOPY/HOLMIUM LASER/STENT PLACEMENT Left 04/16/2018   Procedure: CYSTOSCOPY/LEFT URETEROSCOPY/LEFT RETROGRADE/STENT PLACEMENT;  Surgeon: Lucas Mallow, MD;  Location: [facility];  Service: Urology;  Laterality: Left;  . EUS N/A 05/03/2018   Procedure: UPPER ENDOSCOPIC ULTRASOUND (EUS)  RADIAL;  Surgeon: Milus Banister, MD;  Location: [facility];  Service: Gastroenterology;  Laterality: N/A;  . EUS N/A 05/03/2018   Procedure: UPPER ENDOSCOPIC ULTRASOUND (EUS) LINEAR;  Surgeon: Milus Banister, MD;  Location: [facility];  Service: Gastroenterology;  Laterality: N/A;  . EXTRACORPOREAL SHOCK WAVE LITHOTRIPSY  x3  last one 2004 approx.  Marland Kitchen FINE NEEDLE ASPIRATION N/A 05/03/2018  Procedure: FINE NEEDLE ASPIRATION (FNA) LINEAR;  Surgeon: Milus Banister, MD;  Location: [facility];  Service: Gastroenterology;  Laterality: N/A;  . LAPAROSCOPIC NEPHRECTOMY, HAND ASSISTED Right 05/16/2018   Procedure: HAND ASSISTED LAPAROSCOPIC RIGHT NEPHRECTOMY;  Surgeon: Lucas Mallow, MD;  Location: [facility];  Service: Urology;  Laterality: Right;  . left index finger attachment  1980s   3-4 surgeries  . TOE SURGERY  1980s   in beween 2nd and 3rd toes cyst removed   Family History Family History  Problem Relation Age of Onset  . Alzheimer's disease Mother   . Alzheimer's disease Father   . Heart disease Father   . Heart attack Father   . Cancer - Other Sister     Social History Social History   Tobacco Use  . Smoking status: Never Smoker  . Smokeless tobacco: Never Used  Substance Use Topics  . Alcohol use: Never    Frequency: Never  . Drug use: Never   Allergies Penicillins  Review of Systems Review of Systems All other systems are reviewed and are negative for acute change except as noted in the HPI  Physical Exam Vital Signs  I have reviewed the triage vital signs BP 102/61   Pulse (!) 102   Resp 17   Ht _0  (1.854 m)   Wt (!) 136.5 kg   SpO2 99%   BMI 39.71 kg/m   Physical Exam Vitals signs reviewed.  Constitutional:      General: He is not in acute distress.    Appearance: He is well-developed. He is obese. He is not diaphoretic.  HENT:     Head: Normocephalic and atraumatic.     Nose: Nose normal.  Eyes:     General: No scleral icterus.        Right eye: No discharge.        Left eye: No discharge.     Conjunctiva/sclera: Conjunctivae normal.     Pupils: Pupils are equal, round, and reactive to light.  Neck:     Musculoskeletal: Normal range of motion and neck supple.  Cardiovascular:     Rate and Rhythm: Normal rate and regular rhythm.     Heart sounds: No murmur. No friction rub. No gallop.   Pulmonary:     Effort: Pulmonary effort is normal. No respiratory distress.     Breath sounds: Normal breath sounds. No stridor. No rales.  Abdominal:     General: There is no distension.     Palpations: Abdomen is soft.     Tenderness: There is no abdominal tenderness.  Musculoskeletal:        General: No tenderness.  Skin:    General: Skin is warm and dry.     Findings: No erythema or rash.  Neurological:     Mental Status: He is alert and oriented to person, place, and time.     ED Results and Treatments Labs (all labs ordered are listed, but only abnormal results are displayed) Labs Reviewed  CBC - Abnormal; Notable for the following components:      Result Value   WBC 12.2 (*)    Hemoglobin 12.3 (*)    All other components within normal limits  SARS CORONAVIRUS 2 (HOSPITAL ORDER, [facility])  TROPONIN I (HIGH SENSITIVITY)  D-DIMER, QUANTITATIVE (NOT AT [facility])  COMPREHENSIVE METABOLIC PANEL  LIPASE, BLOOD  BRAIN NATRIURETIC PEPTIDE  TROPONIN I (HIGH SENSITIVITY)  CBG MONITORING, ED  EKG  EKG Interpretation  Date/Time:  Wednesday March 06 2019 05:41:53 EDT Ventricular Rate:  110 PR Interval:  136 QRS Duration: 88 QT Interval:  332 QTC Calculation: 449 R Axis:   11 Text Interpretation:  Sinus tachycardia Low voltage QRS Septal infarct , age undetermined Abnormal ECG No significant change since last tracing Confirmed by Addison Lank [phone]) on 03/06/2019 6:16:34 AM       Radiology Dg Chest 2 View  Result Date: 03/06/2019 CLINICAL DATA:  Anterior chest pain. Pain woke the patient from sleep. Pain is worse with a deep breath. Initial encounter. EXAM: CHEST - 2 VIEW COMPARISON:  One-view chest x-ray 12/19/2018 FINDINGS: The heart size is exaggerated by low lung volumes. Mild bibasilar airspace opacities likely reflect atelectasis. No other significant consolidation is present. There is no edema or effusion. The visualized soft tissues and bony thorax are unremarkable. IMPRESSION: 1. Mild bibasilar opacities in the setting of low lung volumes likely reflect atelectasis. 2. Borderline cardiomegaly without failure.  If Electronically Signed   By: San Morelle M.D.   On: 03/06/2019 06:35    Pertinent labs & imaging results that were available during my care of the patient were reviewed by me and considered in my medical decision making (see chart for details).  Medications Ordered in ED Medications  sodium chloride flush (NS) 0.9 % injection 3 mL (has no administration in time range)  nitroGLYCERIN (NITROSTAT) SL tablet 0.4 mg (has no administration in time range)  fentaNYL (SUBLIMAZE) injection 50 mcg (has no administration in time range)  alum & mag hydroxide-simeth (MAALOX/MYLANTA) 200-200-20 MG/5ML suspension 30 mL (30 mLs Oral Given 03/06/19 7096)                                                                                                                                    Procedures Procedures  (including critical care time)  Medical Decision Making / ED Course I have reviewed the nursing notes for this encounter and the patient's prior records (if available in EHR or on provided paperwork).   George Lewis. was evaluated in Emergency Department on 03/06/2019 for the symptoms described in the history of present illness. He was evaluated in the context of the global COVID-19 pandemic, which necessitated consideration that the patient might be  at risk for infection with the SARS-CoV-2 virus that causes COVID-19. Institutional protocols and algorithms that pertain to the evaluation of patients at risk for COVID-19 are in a state of rapid change based on information released by regulatory bodies including the CDC and federal and state organizations. These policies and algorithms were followed during the patient's care in the ED.  Substernal chest pain.  EKG without acute ischemic changes or evidence of pericarditis.  Heart score 5.    Initial troponin pending.  If negative and rest of the work-up is negative, will require admission for ACS rule out.  Intermidiate suspicion for pulmonary  embolism.  Dimer pending.  If positive patient will require VQ scan.  Chest x-ray without evidence suggestive of pneumonia, pneumothorax, pneumomediastinum.  No abnormal contour of the mediastinum to suggest dissection. No evidence of acute injuries.  Low suspicion for dissection.  Presentation not suspicious for esophageal perforation.  Possible GI related.  Patient provided with GI cocktail without any relief.  Nitroglycerin ordered.  Rest of the screening labs also pending.  Patient care turned over to Dr Rex Kras at 0700. Patient case and results discussed in detail; please see their note for further ED managment.         Final Clinical Impression(s) / ED Diagnoses Final diagnoses:  Precordial pain      This chart was dictated using voice recognition software.  Despite best efforts to proofread,  errors can occur which can change the documentation meaning.   Fatima Blank, MD 03/06/19 [phone]

## 2019-03-06 NOTE — H&P (Signed)
History and Physical    George Lewis. PPI:[id number] DOB: 11-22-1955 DOA: 03/06/2019  PCP: Esaw Grandchild, NP Consultants:  Cruzita Lederer - endocrinology; Hollie Salk - nephrology; Carlis Abbott - vascular; Gloriann Loan - urology Patient coming from:  Home - lives with wife; NOK: Wife, [phone]  Chief Complaint: chest pain  HPI: George Lewis. is a 63 y.o. male with medical history significant of R renal cell carcinoma s/p nephrectomy 9/19; ureteral stent placement 8/19; OSA on CPAP; HTN; h/o remote PE; stage 4 CKD; chronic diastolic CHF (echo 11/18/53); and morbid obesity (BMI 40) presenting with chest pain.  He awoke this AM about 430 with sharp chest pain so bad he could hardly breathe.  He isn't working, had stage 3 renal cell CA and had stent with urethral stent and blood infection.  Has not been able to work.  He saw urology 6/1 and was sent to the ER because his blood sugar was 926.  Had fistula placed for HD.  Pain was substernal and radiated into his back.  He got pain medication in the ER and this eased off the pain - he can barely feel it if he breathes hard now.  No diaphoresis.  Had some cold chills.  Mild left 1/2 finger numbness.   ED Course:  Substernal CP, concern for ACS.  With recent CA, check for PE - barely elevated D-dimer but unable to get CTA so needs VQ scan.  BNP normal, HS troponin normal x 1.  Negative COVID.  HEAR score is elevated with lower suspicion for PE - likely needs cardiac evaluation.  Review of Systems: As per HPI; otherwise review of systems reviewed and negative.   Ambulatory Status:  Ambulates without assistance  Past Medical History:  Diagnosis Date  . Arthritis    knees  . Bilateral renal cysts 03/23/2018   Noted MRI ABD  . Cholelithiasis 03/19/2018   noted on CT AB/Pelvis  . CKD (chronic kidney disease), stage IV [facility])    nephrologist-- dr Hollie Salk [name]);  05-01-2018 has not started dialysis, scheduled for AV fistula creation 05-07-2018  .  Diverticulosis of colon 04/19/2018   Noted on CT abd/pelvis  . GERD (gastroesophageal reflux disease)   . Hepatic steatosis 03/19/2018   Mild diffuse, noted on CT AB/Pelvis  . History of kidney stones   . History of pulmonary embolus (PE) 03/2008   treated with coumadin for 6 months  . History of sepsis 04/20/2018   per discharge note , probable UTI  . Hypertension   . Nocturia   . OSA on CPAP   . Pancreatic lesion    0.4cm cystic per CT 07/ 2019  . Pneumonia   . Pulmonary nodule    Solid 83m Right Lower Lobe  . Right renal mass 04/10/2018   new dx--  scheduled for nephrectomy 05-16-2018  . S/P ureteral stent placement: 04/16/2018 04/19/2018    Past Surgical History:  Procedure Laterality Date  . AV FISTULA PLACEMENT Left 05/07/2018   Procedure: Creation of Left arm Radiocephalic Fistula;  Surgeon: CMarty Heck MD;  [place]  Service: Vascular;  Laterality: Left;  . CYSTOSCOPY/RETROGRADE/URETEROSCOPY/STONE EXTRACTION WITH BASKET  x2 last one 1990s approx.  . CYSTOSCOPY/URETEROSCOPY/HOLMIUM LASER/STENT PLACEMENT Left 04/16/2018   Procedure: CYSTOSCOPY/LEFT URETEROSCOPY/LEFT RETROGRADE/STENT PLACEMENT;  Surgeon: BLucas Mallow MD;  Location: [facility];  Service: Urology;  Laterality: Left;  . EUS N/A 05/03/2018   Procedure: UPPER ENDOSCOPIC ULTRASOUND (EUS) RADIAL;  Surgeon: JMilus Banister MD;  [facility];  Service: Gastroenterology;  Laterality: N/A;  . EUS N/A 05/03/2018   Procedure: UPPER ENDOSCOPIC ULTRASOUND (EUS) LINEAR;  Surgeon: Milus Banister, MD;  [facility];  Service: Gastroenterology;  Laterality: N/A;  . EXTRACORPOREAL SHOCK WAVE LITHOTRIPSY  x3  last one 2004 approx.  Marland Kitchen FINE NEEDLE ASPIRATION N/A 05/03/2018   Procedure: FINE NEEDLE ASPIRATION (FNA) LINEAR;  Surgeon: Milus Banister, MD;  [facility];  Service: Gastroenterology;  Laterality: N/A;  . LAPAROSCOPIC NEPHRECTOMY, HAND ASSISTED Right 05/16/2018   Procedure:  HAND ASSISTED LAPAROSCOPIC RIGHT NEPHRECTOMY;  Surgeon: Lucas Mallow, MD;  Location: [facility];  Service: Urology;  Laterality: Right;  . left index finger attachment  1980s   3-4 surgeries  . TOE SURGERY  1980s   in beween 2nd and 3rd toes cyst removed    Social History   Socioeconomic History  . Marital status: Married    Spouse name: Not on file  . Number of children: Not on file  . Years of education: Not on file  . Highest education level: Not on file  Occupational History  . Occupation: disabled  Social Needs  . Financial resource strain: Patient refused  . Food insecurity    Worry: Never true    Inability: Never true  . Transportation needs    Medical: No    Non-medical: No  Tobacco Use  . Smoking status: Never Smoker  . Smokeless tobacco: Never Used  Substance and Sexual Activity  . Alcohol use: Never    Frequency: Never  . Drug use: Never  . Sexual activity: Not on file  Lifestyle  . Physical activity    Days per week: 0 days    Minutes per session: 0 min  . Stress: Not on file  Relationships  . Social connections    Talks on phone: More than three times a week    Gets together: Never    Attends religious service: More than 4 times per year    Active member of club or organization: No    Attends meetings of clubs or organizations: Never    Relationship status: Married  . Intimate partner violence    Fear of current or ex partner: Not on file    Emotionally abused: Not on file    Physically abused: Not on file    Forced sexual activity: Not on file  Other Topics Concern  . Not on file  Social History Narrative  . Not on file    Allergies  Allergen Reactions  . Penicillins Rash and Other (See Comments)    Has patient had a PCN reaction causing immediate rash, facial/tongue/throat swelling, SOB or lightheadedness with hypotension: yes Has patient had a PCN reaction causing severe rash involving mucus membranes or skin necrosis: no Has patient had  a PCN reaction that required hospitalization: no Has patient had a PCN reaction occurring within the last 10 years: no If all of the above answers are "NO", then may proceed with Cephalosporin use.     Family History  Problem Relation Age of Onset  . Alzheimer's disease Mother   . Alzheimer's disease Father   . Heart disease Father   . Heart attack Father   . Cancer - Other Sister     Prior to Admission medications   Medication Sig Start Date End Date Taking? Authorizing Provider  acetaminophen (TYLENOL) 325 MG tablet Take 650 mg by mouth every 6 (six) hours as needed for mild pain.  Yes [provider]  allopurinol (ZYLOPRIM) 100 MG tablet Take 1 tablet (100 mg total) by mouth every evening. 12/22/18  Yes Danford, Suann Larry, MD  atorvastatin (LIPITOR) 20 MG tablet Take 1 tablet (20 mg total) by mouth daily. 02/27/19  Yes Danford, Berna Spare, NP  blood glucose meter kit and supplies Dispense based on patient and insurance preference. Use up to four times daily as directed. (FOR ICD-10 E10.9, E11.9). 12/22/18  Yes Danford, Suann Larry, MD  Continuous Blood Gluc Sensor (FREESTYLE LIBRE 14 DAY SENSOR) MISC 1 each by Does not apply route every 14 (fourteen) days. Change every 2 weeks 02/11/19  Yes Philemon Kingdom, MD  furosemide (LASIX) 40 MG tablet Take 1 tablet by mouth daily. 09/17/18  Yes [provider]  insulin glargine (LANTUS) 100 UNIT/ML injection Inject 0.32 mLs (32 Units total) into the skin at bedtime. Patient taking differently: Inject 26 Units into the skin at bedtime.  02/11/19  Yes Philemon Kingdom, MD  insulin lispro (HUMALOG) 100 UNIT/ML cartridge Inject 0.06-0.08 mLs (6-8 Units total) into the skin 3 (three) times daily with meals. Per sliding scale. Patient taking differently: Inject 6 Units into the skin 3 (three) times daily with meals.  02/11/19  Yes Philemon Kingdom, MD  loratadine (CLARITIN) 10 MG tablet Take 10 mg by mouth every evening.    Yes  [provider]  metoprolol tartrate (LOPRESSOR) 25 MG tablet Take 25 mg by mouth daily.   Yes [provider]  omeprazole (PRILOSEC OTC) 20 MG tablet Take 20 mg by mouth daily.   Yes [provider]  Potassium Citrate 15 MEQ (1620 MG) TBCR Take 1 tablet by mouth every evening.  03/22/18  Yes [provider]  sodium bicarbonate 650 MG tablet Take 650 mg by mouth daily.   Yes [provider]  tamsulosin (FLOMAX) 0.4 MG CAPS capsule Take 0.4 mg by mouth every evening.  03/08/18  Yes [provider]  senna-docusate (SENOKOT-S) 8.6-50 MG tablet Take 1 tablet by mouth daily. Patient not taking: Reported on 03/06/2019 05/21/18   Fredricka Bonine, MD    Physical Exam: Vitals:   03/06/19 1130 03/06/19 1231 03/06/19 1240 03/06/19 1346  BP: 131/80  95/65   Pulse: 91 (!) 112 (!) 107 (!) 118  Resp:  18    Temp:  98.3 F (36.8 C)    TempSrc:  Oral    SpO2: 99% 100%    Weight:   132.8 kg   Height:   6' 0.99" (1.854 m)      . General:  Appears calm and comfortable and is NAD . Eyes:  PERRL, EOMI, normal lids, iris . ENT:  grossly normal hearing, lips & tongue, mmm . Neck:  no LAD, masses or thyromegaly . Cardiovascular:  RRR, no m/r/g. No LE edema.  Marland Kitchen Respiratory:   CTA bilaterally with no wheezes/rales/rhonchi.  Normal respiratory effort. . Abdomen:  soft, NT, ND, NABS . Skin:  no rash or induration seen on limited exam; chronic stasis dermatitis noted on B LE . Musculoskeletal:  grossly normal tone BUE/BLE, good ROM, no bony abnormality . Psychiatric:  grossly normal mood and affect, speech fluent and appropriate, AOx3 . Neurologic:  CN 2-12 grossly intact, moves all extremities in coordinated fashion, sensation intact    Radiological Exams on Admission: Dg Chest 2 View  Result Date: 03/06/2019 CLINICAL DATA:  Anterior chest pain. Pain woke the patient from sleep. Pain is worse with a deep breath. Initial encounter. EXAM: CHEST -  2 VIEW  COMPARISON:  One-view chest x-ray 12/19/2018 FINDINGS: The heart size is exaggerated by low lung volumes. Mild bibasilar airspace opacities likely reflect atelectasis. No other significant consolidation is present. There is no edema or effusion. The visualized soft tissues and bony thorax are unremarkable. IMPRESSION: 1. Mild bibasilar opacities in the setting of low lung volumes likely reflect atelectasis. 2. Borderline cardiomegaly without failure.  If Electronically Signed   By: San Morelle M.D.   On: 03/06/2019 06:35   Nm Pulmonary Perfusion  Result Date: 03/06/2019 CLINICAL DATA:  Elevated D-dimer. Chest pain which radiates to back. Shortness of breath. EXAM: NUCLEAR MEDICINE PERFUSION LUNG SCAN TECHNIQUE: Perfusion images were obtained in multiple projections after intravenous injection of radiopharmaceutical. Ventilation scans intentionally deferred if perfusion scan and chest x-ray adequate for interpretation during COVID 19 epidemic. RADIOPHARMACEUTICALS:  1.4 mCi Tc-18mMAA IV COMPARISON:  Chest x-ray 03/06/2019 FINDINGS: There are no lobar, segmental or subsegmental perfusion defects identified to suggest pulmonary embolus. IMPRESSION: 1. Negative exam. No perfusion defects identified suggestive of acute pulmonary embolus. Electronically Signed   By: TKerby MoorsM.D.   On: 03/06/2019 15:06    EKG: Independently reviewed.  Sinus tachycardia with rate 110; nonspecific ST changes with no evidence of acute ischemia; NSCSLT    Labs on Admission: I have personally reviewed the available labs and imaging studies at the time of the admission.  Pertinent labs:   CO2 20 Glucose 101 BUN 56/Creatinine 4.54/GFR 13 HS troponin 9, 7 D-dimer 0.82 COVID negative  Assessment/Plan Principal Problem:   Chest pain Active Problems:   Renal cell carcinoma, right (HCC)   OSA on CPAP   HTN (hypertension)   Diabetes mellitus type 2 in obese (HCC)   Obesity (BMI 30-39.9)   Chest pain  -Patient with substernal chest pressure came on while sleeping overnight with radiation into his back and left chest, resolved spontaneously -1/3 typical symptoms suggestive of noncardiac chest pain.  -CXR unremarkable.   -HS troponin negative x 2 -EKG not indicative of acute ischemia.   -HEAR score is 5, indicating that the patient has an elevated risk score and requires further evaluation. -Will plan to place in observation status on telemetry to rule out ACS by overnight observation.  -Repeat EKG in AM -Start ASA 81 mg  daily -Cardiology consultation   HTN -Continue Lopressor  HLD -Continue Lipitor, increase to 40 mg daily -Lipids were checked on 6/29 (TC 183, HDL 25, LDL 131, TG 135) so will not repeat at this time  DM -Last A1c was 7.4 on 6/15 -Continue Lantus -Will cover with resistant-scale SSI for now  Stage 5 CKD -Has fistula in place but has not yet started HD -On bicarb, which will be continued  OSA on CPAP -Continue qhs CPAP  Renal cell CA  -s/p nephrectomy -VQ scan negative for PE   Note: This patient has been tested and is negative for the novel coronavirus COVID-19.   DVT prophylaxis: Lovenox  Code Status:  DNR - confirmed with patient Family Communication: None present; I spoke with his wife by telephone Disposition Plan:  Home once clinically improved Consults called: Cardiology Admission status:  It is my clinical opinion that referral for OBSERVATION is reasonable and necessary in this patient based on the above information provided. The aforementioned taken together are felt to place the patient at high risk for further clinical deterioration. However it is anticipated that the patient may be medically stable for discharge from the hospital within 24 to  48 hours.   Karmen Bongo MD [facility]   How to contact the [facility] Attending or Consulting provider [place] or covering provider during after hours [place], for this patient?  1. Check the  care team in [facility] and look for a) attending/consulting [facility] provider listed and b) the [facility] team listed 2. Log into [email or web address] and use [place]'s universal password to access. If you do not have the password, please contact the hospital operator. 3. Locate the [facility] provider you are looking for under [facility] and page to a number that you can be directly reached. 4. If you still have difficulty reaching the provider, please page the [facility] (Director on Call) for the Hospitalists listed on amion for assistance.   03/06/2019, 4:21 PM

## 2019-03-07 DIAGNOSIS — I472 Ventricular tachycardia: Secondary | ICD-10-CM | POA: Diagnosis not present

## 2019-03-07 DIAGNOSIS — R072 Precordial pain: Secondary | ICD-10-CM | POA: Diagnosis not present

## 2019-03-07 DIAGNOSIS — E669 Obesity, unspecified: Secondary | ICD-10-CM | POA: Diagnosis not present

## 2019-03-07 DIAGNOSIS — I10 Essential (primary) hypertension: Secondary | ICD-10-CM | POA: Diagnosis not present

## 2019-03-07 DIAGNOSIS — R945 Abnormal results of liver function studies: Secondary | ICD-10-CM

## 2019-03-07 DIAGNOSIS — E1169 Type 2 diabetes mellitus with other specified complication: Secondary | ICD-10-CM | POA: Diagnosis not present

## 2019-03-07 LAB — GLUCOSE, CAPILLARY
Glucose-Capillary: 91 mg/dL (ref 70–99)
Glucose-Capillary: 96 mg/dL (ref 70–99)

## 2019-03-07 MED ORDER — ATORVASTATIN CALCIUM 40 MG PO TABS: 40.0000 mg | ORAL_TABLET | Freq: Every day | ORAL | 0 refills | Status: DC

## 2019-03-07 MED ORDER — ASPIRIN 81 MG PO TBEC: 81.0000 mg | DELAYED_RELEASE_TABLET | Freq: Every day | ORAL | 0 refills | Status: DC

## 2019-03-07 MED ORDER — NITROGLYCERIN 0.4 MG SL SUBL: 0.4000 mg | SUBLINGUAL_TABLET | SUBLINGUAL | 12 refills | Status: AC | PRN

## 2019-03-07 NOTE — Progress Notes (Signed)
Patient had a 12 beat run of V-Tach, and upon assessment, was sleeping, pain-free with VSS, updated [facility], will continue to monitor.

## 2019-03-07 NOTE — Discharge Instructions (Signed)

## 2019-03-07 NOTE — Progress Notes (Signed)
Discharge instructions reviewed with and given to patient.  IV and Telemetry removed.  Patient taken to private vehicle via wheelchair.  Patient in no active distress.

## 2019-03-07 NOTE — Progress Notes (Signed)
Progress Note  Patient Name: George Lewis. Date of Encounter: 03/07/2019  Primary Cardiologist:   Elouise Munroe, MD   Subjective   No chest pain.  He did not feel palpitations despite run of NSVT   Inpatient Medications    Scheduled Meds:  allopurinol  100 mg Oral QPM   aspirin EC  81 mg Oral Daily   atorvastatin  40 mg Oral Daily   enoxaparin (LOVENOX) injection  30 mg Subcutaneous Q24H   furosemide  40 mg Oral Daily   insulin aspart  0-20 Units Subcutaneous TID WC   insulin aspart  0-5 Units Subcutaneous QHS   insulin glargine  26 Units Subcutaneous QHS   loratadine  10 mg Oral QPM   metoprolol tartrate  25 mg Oral Daily   potassium citrate  10 mEq Oral QPM   sodium bicarbonate  650 mg Oral Daily   tamsulosin  0.4 mg Oral QPM   Continuous Infusions:  PRN Meds: acetaminophen, calcium carbonate (dosed in mg elemental calcium), camphor-menthol **AND** hydrOXYzine, docusate sodium, feeding supplement (NEPRO CARB STEADY), nitroGLYCERIN, ondansetron (ZOFRAN) IV, sorbitol, zolpidem   Vital Signs    Vitals:   03/06/19 2239 03/07/19 0000 03/07/19 0330 03/07/19 0858  BP:   117/70 122/73  Pulse: 91  79 86  Resp: 18  18   Temp:  99.8 F (37.7 C) 97.6 F (36.4 C)   TempSrc:  Oral Oral   SpO2: 98%  100%   Weight:   132.4 kg   Height:        Intake/Output Summary (Last 24 hours) at 03/07/2019 0939 Last data filed at 03/06/2019 2300 Gross per 24 hour  Intake 600 ml  Output 700 ml  Net -100 ml   Filed Weights   03/06/19 0542 03/06/19 1240 03/07/19 0330  Weight: (!) 136.5 kg 132.8 kg 132.4 kg    Telemetry    NSVT, ST, sinus rhythm - Personally Reviewed  ECG    NA - Personally Reviewed  Physical Exam   GEN: No acute distress.   Neck: No  JVD Cardiac: RRR, no murmurs, rubs, or gallops.  Respiratory: Clear  to auscultation bilaterally. GI: Soft, nontender, non-distended  MS: No  edema; No deformity. Neuro:  Nonfocal  Psych: Normal  affect   Labs    Chemistry Recent Labs  Lab 03/06/19 0634  NA 136  K 4.7  CL 102  CO2 20*  GLUCOSE 101*  BUN 56*  CREATININE 4.54*  CALCIUM 9.0  PROT 7.6  ALBUMIN 3.7  AST 17  ALT 11  ALKPHOS 98  BILITOT 0.7  GFRNONAA 13*  GFRAA 15*  ANIONGAP 14     Hematology Recent Labs  Lab 03/06/19 0555  WBC 12.2*  RBC 4.49  HGB 12.3*  HCT 39.2  MCV 87.3  MCH 27.4  MCHC 31.4  RDW 14.8  PLT 252    Cardiac EnzymesNo results for input(s): TROPONINI in the last 168 hours. No results for input(s): TROPIPOC in the last 168 hours.   BNP Recent Labs  Lab 03/06/19 0555  BNP 34.8     DDimer  Recent Labs  Lab 03/06/19 0634  DDIMER 0.82*     Radiology    Dg Chest 2 View  Result Date: 03/06/2019 CLINICAL DATA:  Anterior chest pain. Pain woke the patient from sleep. Pain is worse with a deep breath. Initial encounter. EXAM: CHEST - 2 VIEW COMPARISON:  One-view chest x-ray 12/19/2018 FINDINGS: The heart size is exaggerated by  low lung volumes. Mild bibasilar airspace opacities likely reflect atelectasis. No other significant consolidation is present. There is no edema or effusion. The visualized soft tissues and bony thorax are unremarkable. IMPRESSION: 1. Mild bibasilar opacities in the setting of low lung volumes likely reflect atelectasis. 2. Borderline cardiomegaly without failure.  If Electronically Signed   By: San Morelle M.D.   On: 03/06/2019 06:35   Nm Pulmonary Perfusion  Result Date: 03/06/2019 CLINICAL DATA:  Elevated D-dimer. Chest pain which radiates to back. Shortness of breath. EXAM: NUCLEAR MEDICINE PERFUSION LUNG SCAN TECHNIQUE: Perfusion images were obtained in multiple projections after intravenous injection of radiopharmaceutical. Ventilation scans intentionally deferred if perfusion scan and chest x-ray adequate for interpretation during COVID 19 epidemic. RADIOPHARMACEUTICALS:  1.4 mCi Tc-7m MAA IV COMPARISON:  Chest x-ray 03/06/2019 FINDINGS:  There are no lobar, segmental or subsegmental perfusion defects identified to suggest pulmonary embolus. IMPRESSION: 1. Negative exam. No perfusion defects identified suggestive of acute pulmonary embolus. Electronically Signed   By: Kerby Moors M.D.   On: 03/06/2019 15:06    Cardiac Studies   Echo  1. The left ventricle has normal systolic function with an ejection fraction of 60-65%. The cavity size was normal. Left ventricular diastolic Doppler parameters are consistent with impaired relaxation.  2. The right ventricle has normal systolic function. The cavity was normal. There is no increase in right ventricular wall thickness.  3. Left atrial size was mildly dilated.  4. The aortic valve is tricuspid. Mild thickening of the aortic valve. Mild calcification of the aortic valve.  Patient Profile     63 y.o. 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 Lorin Mercy.   Assessment & Plan    CHEST PAIN:  Atypical.  Trop negative.  No acute EKG changes.  No further in patient work up.    NSVT:  This was noted in the past as well.  We can set up an out patient Lexiscan Myoview given this and he can follow up with Elouise Munroe, MD.    For questions or updates, please contact [facility] Please consult [email or web address] for contact info under Cardiology/STEMI.   Signed, Minus Breeding, MD  03/07/2019, 9:39 AM

## 2019-03-08 ENCOUNTER — Telehealth: Payer: Self-pay | Admitting: Internal Medicine

## 2019-03-08 NOTE — Telephone Encounter (Signed)
Notified patient and his wife of message from Dr. Cruzita Lederer, xpressed understanding and agreement. No further questions.

## 2019-03-08 NOTE — Telephone Encounter (Signed)
Ok to stay without the Humalog for the next few days but gather more data during the weekend and let us know on Monday.  If her sugars do increase, they may need to add back a low-dose Humalog.

## 2019-03-08 NOTE — Telephone Encounter (Signed)
Patient's wife George Lewis ph# [phone] called re: Patient went to hospital 03/06/19 and was released 03/07/19. At the hospital the Doctor instructed patient to stop taking Humalog and to continue taking Lantus with an upped dosage of 32 units. Patient's wife requests to be called at the ph# listed above to advise on how to proceed if patient's blood sugars starts to drop or gets high and to give her blood sugar baselines. Please call today if possible.

## 2019-03-08 NOTE — Discharge Summary (Signed)
Physician Discharge Summary  George Lewis. GXQ:[id number] DOB: 11-Feb-1956 DOA: 03/06/2019  PCP: Mina Marble D, NP  Admit date: 03/06/2019 Discharge date: 03/08/2019  Recommendations for Outpatient Follow-up:  1. Follow up with cardiology for [facility] 2. Follow up for outpatient right upper quadrant ultrasound 3. Follow up with PCP in 7-10 days.  Follow-up Information    Elouise Munroe, MD Follow up.   Specialty: Cardiology Why: outpatient stress test then you will follow up with Dr Karmen Stabs information: [address] [place] [address] [phone]          Discharge Diagnoses: Principal diagnosis is #1 1. Chest pain/Epigastric abdominal pain 2. Hepatic steatosis 3. Single kidney 4. ESRD 5. Bradycardia 6. DM II  Discharge Condition: Fair Disposition: Home  Diet recommendation: Carbohydrate controlled  Filed Weights   03/06/19 0542 03/06/19 1240 03/07/19 0330  Weight: (!) 136.5 kg 132.8 kg 132.4 kg    History of present illness:  George Sevin. is a 63 y.o. male with medical history significant of R renal cell carcinoma s/p nephrectomy 9/19; ureteral stent placement 8/19; OSA on CPAP; HTN; h/o remote PE; stage 4 CKD; chronic diastolic CHF (echo 6/37/85); and morbid obesity (BMI 40) presenting with chest pain.  He awoke this AM about 430 with sharp chest pain so bad he could hardly breathe.  He isn't working, had stage 3 renal cell CA and had stent with urethral stent and blood infection.  Has not been able to work.  He saw urology 6/1 and was sent to the ER because his blood sugar was 926.  Had fistula placed for HD.  Pain was substernal and radiated into his back.  He got pain medication in the ER and this eased off the pain - he can barely feel it if he breathes hard now.  No diaphoresis.  Had some cold chills.  Mild left 1/2 finger numbness.   ED Course:  Substernal CP, concern for ACS.  With recent CA, check for PE  - barely elevated D-dimer but unable to get CTA so needs VQ scan.  BNP normal, HS troponin normal x 1.  Negative COVID.  HEAR score is elevated with lower suspicion for PE - likely needs cardiac evaluation.   Hospital Course:  The patient was admitted to a telemetry bed. He was ruled out for MI by EKG and enzyme criteria. Cardiology was consulted. They cleared the patient for discharge and set him up for outpatient Lexiscan Myoview. The patient also had some elevated LFT's. He will also have a right upper quadrant ultrasound as outpatient.  He was discharged to home in good condition.  Today's assessment: S: The patient is sitting up at bedside. He is anxious to go home. O: Vitals:  Vitals:   03/07/19 0330 03/07/19 0858  BP: 117/70 122/73  Pulse: 79 86  Resp: 18   Temp: 97.6 F (36.4 C)   SpO2: 100%     Constitutional:   The patient is awake, alert, and oriented x 3. No acute distress.   Respiratory:   No increased work of breathing.  No wheezes, rales, or rhonchi  No tactile fremitus Cardiovascular:   Regular rate and rhythm  No murmurs, ectopy, or gallups.  No lateral PMI. No thrills. Abdomen:   Abdomen is soft, non-tender, non-distended.  No hernias, masses, or organomegalyu  Normoactive bowel sounds. Musculoskeletal:   No cyanosis, clubbing, or edema Skin:   No rashes, lesions, ulcers  palpation of  skin: no induration or nodules Neurologic:   CN 2-12 intact  Sensation all 4 extremities intact Psychiatric:   judgement and insight appear normal  Mental status o Mood, affect appropriate o Orientation to person, place, time  Discharge Instructions  Discharge Instructions    Activity as tolerated - No restrictions   Complete by: As directed    Call MD for:  difficulty breathing, headache or visual disturbances   Complete by: As directed    Call MD for:  persistant nausea and vomiting   Complete by: As directed    Call MD for:  severe  uncontrolled pain   Complete by: As directed    Diet Carb Modified   Complete by: As directed    Low sodium   Discharge instructions   Complete by: As directed    Follow up as per cardiology for [facility] as outpatient. Follow up as outpatient for Right upper quadrant ultrasound. Follow up with PCP in 7-10 days.   Increase activity slowly   Complete by: As directed      Allergies as of 03/07/2019      Reactions   Penicillins Rash, Other (See Comments)   Has patient had a PCN reaction causing immediate rash, facial/tongue/throat swelling, SOB or lightheadedness with hypotension: yes Has patient had a PCN reaction causing severe rash involving mucus membranes or skin necrosis: no Has patient had a PCN reaction that required hospitalization: no Has patient had a PCN reaction occurring within the last 10 years: no If all of the above answers are "NO", then may proceed with Cephalosporin use.      Medication List    STOP taking these medications   HumaLOG 100 UNIT/ML cartridge Generic drug: insulin lispro     TAKE these medications   acetaminophen 325 MG tablet Commonly known as: TYLENOL Take 650 mg by mouth every 6 (six) hours as needed for mild pain.   allopurinol 100 MG tablet Commonly known as: ZYLOPRIM Take 1 tablet (100 mg total) by mouth every evening. Notes to patient: TAKE TONIGHT   aspirin 81 MG EC tablet Take 1 tablet (81 mg total) by mouth daily. Notes to patient: TOMORROW   atorvastatin 40 MG tablet Commonly known as: LIPITOR Take 1 tablet (40 mg total) by mouth daily. What changed:   medication strength  how much to take Notes to patient: TOMORROW   blood glucose meter kit and supplies Dispense based on patient and insurance preference. Use up to four times daily as directed. (FOR ICD-10 E10.9, E11.9).   FreeStyle Libre 14 Day Sensor Misc 1 each by Does not apply route every 14 (fourteen) days. Change every 2 weeks   furosemide 40 MG  tablet Commonly known as: LASIX Take 1 tablet by mouth daily. Notes to patient: TOMORROW   insulin glargine 100 UNIT/ML injection Commonly known as: LANTUS Inject 0.32 mLs (32 Units total) into the skin at bedtime. What changed: how much to take Notes to patient: TAKE TONIGHT   loratadine 10 MG tablet Commonly known as: CLARITIN Take 10 mg by mouth every evening. Notes to patient: TAKE TONIGHT   metoprolol tartrate 25 MG tablet Commonly known as: LOPRESSOR Take 25 mg by mouth daily. Notes to patient: TOMORROW   nitroGLYCERIN 0.4 MG SL tablet Commonly known as: NITROSTAT Place 1 tablet (0.4 mg total) under the tongue every 5 (five) minutes as needed for chest pain.   omeprazole 20 MG tablet Commonly known as: PRILOSEC OTC Take 20 mg by mouth daily.  Potassium Citrate 15 MEQ (1620 MG) Tbcr Take 1 tablet by mouth every evening. Notes to patient: TAKE TONIGHT   sodium bicarbonate 650 MG tablet Take 650 mg by mouth daily. Notes to patient: TOMORROW   tamsulosin 0.4 MG Caps capsule Commonly known as: FLOMAX Take 0.4 mg by mouth every evening. Notes to patient: TAKE TONIGHT      Allergies  Allergen Reactions   Penicillins Rash and Other (See Comments)    Has patient had a PCN reaction causing immediate rash, facial/tongue/throat swelling, SOB or lightheadedness with hypotension: yes Has patient had a PCN reaction causing severe rash involving mucus membranes or skin necrosis: no Has patient had a PCN reaction that required hospitalization: no Has patient had a PCN reaction occurring within the last 10 years: no If all of the above answers are "NO", then may proceed with Cephalosporin use.     The results of significant diagnostics from this hospitalization (including imaging, microbiology, ancillary and laboratory) are listed below for reference.    Significant Diagnostic Studies: Dg Chest 2 View  Result Date: 03/06/2019 CLINICAL DATA:  Anterior chest pain. Pain  woke the patient from sleep. Pain is worse with a deep breath. Initial encounter. EXAM: CHEST - 2 VIEW COMPARISON:  One-view chest x-ray 12/19/2018 FINDINGS: The heart size is exaggerated by low lung volumes. Mild bibasilar airspace opacities likely reflect atelectasis. No other significant consolidation is present. There is no edema or effusion. The visualized soft tissues and bony thorax are unremarkable. IMPRESSION: 1. Mild bibasilar opacities in the setting of low lung volumes likely reflect atelectasis. 2. Borderline cardiomegaly without failure.  If Electronically Signed   By: San Morelle M.D.   On: 03/06/2019 06:35   Nm Pulmonary Perfusion  Result Date: 03/06/2019 CLINICAL DATA:  Elevated D-dimer. Chest pain which radiates to back. Shortness of breath. EXAM: NUCLEAR MEDICINE PERFUSION LUNG SCAN TECHNIQUE: Perfusion images were obtained in multiple projections after intravenous injection of radiopharmaceutical. Ventilation scans intentionally deferred if perfusion scan and chest x-ray adequate for interpretation during COVID 19 epidemic. RADIOPHARMACEUTICALS:  1.4 mCi Tc-47mMAA IV COMPARISON:  Chest x-ray 03/06/2019 FINDINGS: There are no lobar, segmental or subsegmental perfusion defects identified to suggest pulmonary embolus. IMPRESSION: 1. Negative exam. No perfusion defects identified suggestive of acute pulmonary embolus. Electronically Signed   By: TKerby MoorsM.D.   On: 03/06/2019 15:06    Microbiology: Recent Results (from the past 240 hour(s))  SARS Coronavirus 2 (CEPHEID - Performed in [facility]), Hosp Order     Status: None   Collection Time: 03/06/19  7:33 AM   Specimen: Nasopharyngeal Swab  Result Value Ref Range Status   SARS Coronavirus 2 NEGATIVE NEGATIVE Final    Comment: (NOTE) If result is NEGATIVE SARS-CoV-2 target nucleic acids are NOT DETECTED. The SARS-CoV-2 RNA is generally detectable in upper and lower  respiratory specimens during the  acute phase of infection. The lowest  concentration of SARS-CoV-2 viral copies this assay can detect is 250  copies / mL. A negative result does not preclude SARS-CoV-2 infection  and should not be used as the sole basis for treatment or other  patient management decisions.  A negative result may occur with  improper specimen collection / handling, submission of specimen other  than nasopharyngeal swab, presence of viral mutation(s) within the  areas targeted by this assay, and inadequate number of viral copies  (<250 copies / mL). A negative result must be combined with clinical  observations, patient history,  and epidemiological information. If result is POSITIVE SARS-CoV-2 target nucleic acids are DETECTED. The SARS-CoV-2 RNA is generally detectable in upper and lower  respiratory specimens dur ing the acute phase of infection.  Positive  results are indicative of active infection with SARS-CoV-2.  Clinical  correlation with patient history and other diagnostic information is  necessary to determine patient infection status.  Positive results do  not rule out bacterial infection or co-infection with other viruses. If result is PRESUMPTIVE POSTIVE SARS-CoV-2 nucleic acids MAY BE PRESENT.   A presumptive positive result was obtained on the submitted specimen  and confirmed on repeat testing.  While 2019 novel coronavirus  (SARS-CoV-2) nucleic acids may be present in the submitted sample  additional confirmatory testing may be necessary for epidemiological  and / or clinical management purposes  to differentiate between  SARS-CoV-2 and other Sarbecovirus currently known to infect humans.  If clinically indicated additional testing with an alternate test  methodology [phone]) is advised. The SARS-CoV-2 RNA is generally  detectable in upper and lower respiratory sp ecimens during the acute  phase of infection. The expected result is Negative. Fact Sheet for Patients:   StrictlyIdeas.no Fact Sheet for Healthcare Providers: BankingDealers.co.za This test is not yet approved or cleared by the [place] FDA and has been authorized for detection and/or diagnosis of SARS-CoV-2 by FDA under an Emergency Use Authorization (EUA).  This EUA will remain in effect (meaning this test can be used) for the duration of the COVID-19 declaration under Section 564(b)(1) of the Act, 21 U.S.C. section 360bbb-3(b)(1), unless the authorization is terminated or revoked sooner. Performed at [facility], [place] [address] [place], [place] [address]      Labs: Basic Metabolic Panel: Recent Labs  Lab 03/06/19 0634  NA 136  K 4.7  CL 102  CO2 20*  GLUCOSE 101*  BUN 56*  CREATININE 4.54*  CALCIUM 9.0   Liver Function Tests: Recent Labs  Lab 03/06/19 0634  AST 17  ALT 11  ALKPHOS 98  BILITOT 0.7  PROT 7.6  ALBUMIN 3.7   Recent Labs  Lab 03/06/19 0634  LIPASE 32   No results for input(s): AMMONIA in the last 168 hours. CBC: Recent Labs  Lab 03/06/19 0555  WBC 12.2*  HGB 12.3*  HCT 39.2  MCV 87.3  PLT 252   Cardiac Enzymes: No results for input(s): CKTOTAL, CKMB, CKMBINDEX, TROPONINI in the last 168 hours. BNP: BNP (last 3 results) Recent Labs    03/06/19 0555  BNP 34.8    ProBNP (last 3 results) No results for input(s): PROBNP in the last 8760 hours.  CBG: Recent Labs  Lab 03/06/19 1228 03/06/19 1715 03/06/19 2241 03/07/19 0719 03/07/19 1214  GLUCAP 88 97 135* 91 96    Principal Problem:   Chest pain Active Problems:   Renal cell carcinoma, right (HCC)   OSA on CPAP   HTN (hypertension)   Diabetes mellitus type 2 in obese (HCC)   Obesity (BMI 30-39.9)   Time coordinating discharge: 38 minutes.  Signed:        Marnie Fazzino, DO [facility]  03/08/2019, 7:41 AM

## 2019-03-14 LAB — HEPATIC FUNCTION PANEL
ALT: 9 — AB (ref 10–40)
AST: 15 (ref 14–40)
Alkaline Phosphatase: 108 (ref 25–125)

## 2019-03-14 LAB — CBC AND DIFFERENTIAL
HCT: 38 — AB (ref 41–53)
Hemoglobin: 12.3 — AB (ref 13.5–17.5)
Platelets: 311 (ref 150–399)
WBC: 12.2

## 2019-03-14 LAB — FE+TIBC+FER
Ferritin: 215
Iron Saturation: 14
Iron: 38
TIBC: 271

## 2019-03-14 LAB — BASIC METABOLIC PANEL
BUN: 67 — AB (ref 4–21)
Creatinine: 4.4 — AB (ref <=1.3)
Glucose: 96
Potassium: 4.6 (ref 3.4–5.3)
Sodium: 135 — AB (ref 137–147)

## 2019-03-14 LAB — HEPATITIS B SURFACE ANTIGEN: Hepatitis B Surface Antigen: NEGATIVE

## 2019-03-14 LAB — HEPATITIS B CORE ANTIBODY, TOTAL: Hep B C IgM: NEGATIVE

## 2019-03-14 LAB — PTH, INTACT: PTH, Intact: 86

## 2019-03-14 LAB — HEPATITIS B SURFACE ANTIBODY,QUALITATIVE: Hep B S Ab: NEGATIVE

## 2019-03-18 ENCOUNTER — Other Ambulatory Visit: Payer: Self-pay | Admitting: Adult Health

## 2019-03-18 NOTE — Telephone Encounter (Signed)
Patient's wife called states he has only one of these pill left :   sodium bicarbonate 650 MG tablet [[id number]]   Order Details Dose: 650 mg Route: Oral Frequency: Daily  Dispense Quantity: -- Refills: -- Fills remaining: --        Sig: Take 650 mg by mouth daily.          ----- Per wife pt was prescribed these while in the hospital in April & had only (1 refill ) which they already used they are requesting refill order be sent to :   [facility], [place] [phone] (Phone) [phone] (Fax)   ---Forwarding request to medical assistant for review w/provider & to call patient if there are any questions.   --glh

## 2019-03-18 NOTE — Telephone Encounter (Signed)
We have not prescribed these medications for the patient previously.  Please review and refill if appropriate.  T. Nelson, CMA  

## 2019-03-19 LAB — HM DIABETES EYE EXAM

## 2019-03-20 DIAGNOSIS — D638 Anemia in other chronic diseases classified elsewhere: Secondary | ICD-10-CM | POA: Insufficient documentation

## 2019-03-20 DIAGNOSIS — E876 Hypokalemia: Secondary | ICD-10-CM | POA: Insufficient documentation

## 2019-03-20 DIAGNOSIS — D631 Anemia in chronic kidney disease: Secondary | ICD-10-CM | POA: Insufficient documentation

## 2019-03-20 DIAGNOSIS — D509 Iron deficiency anemia, unspecified: Secondary | ICD-10-CM | POA: Insufficient documentation

## 2019-03-20 DIAGNOSIS — I129 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 kidney disease, or unspecified chronic kidney disease: Secondary | ICD-10-CM | POA: Insufficient documentation

## 2019-03-20 DIAGNOSIS — E785 Hyperlipidemia, unspecified: Secondary | ICD-10-CM | POA: Insufficient documentation

## 2019-03-20 DIAGNOSIS — R0600 Dyspnea, unspecified: Secondary | ICD-10-CM | POA: Insufficient documentation

## 2019-03-20 DIAGNOSIS — C641 Malignant neoplasm of right kidney, except renal pelvis: Secondary | ICD-10-CM | POA: Insufficient documentation

## 2019-03-20 DIAGNOSIS — N2581 Secondary hyperparathyroidism of renal origin: Secondary | ICD-10-CM | POA: Insufficient documentation

## 2019-03-26 DIAGNOSIS — D689 Coagulation defect, unspecified: Secondary | ICD-10-CM | POA: Insufficient documentation

## 2019-04-20 ENCOUNTER — Other Ambulatory Visit: Payer: Self-pay

## 2019-04-20 ENCOUNTER — Inpatient Hospital Stay (HOSPITAL_COMMUNITY)
Admission: EM | Admit: 2019-04-20 | Discharge: 2019-04-23 | DRG: 371 | Disposition: A | Discharge status: Home / Self Care | Payer: PRIVATE HEALTH INSURANCE | Attending: Internal Medicine | Admitting: Internal Medicine

## 2019-04-20 ENCOUNTER — Encounter (HOSPITAL_COMMUNITY): Payer: Self-pay | Admitting: *Deleted

## 2019-04-20 DIAGNOSIS — R634 Abnormal weight loss: Secondary | ICD-10-CM | POA: Diagnosis present

## 2019-04-20 DIAGNOSIS — E861 Hypovolemia: Secondary | ICD-10-CM | POA: Insufficient documentation

## 2019-04-20 DIAGNOSIS — M109 Gout, unspecified: Secondary | ICD-10-CM | POA: Diagnosis present

## 2019-04-20 DIAGNOSIS — E1122 Type 2 diabetes mellitus with diabetic chronic kidney disease: Secondary | ICD-10-CM | POA: Diagnosis present

## 2019-04-20 DIAGNOSIS — Z86711 Personal history of pulmonary embolism: Secondary | ICD-10-CM

## 2019-04-20 DIAGNOSIS — I4891 Unspecified atrial fibrillation: Secondary | ICD-10-CM

## 2019-04-20 DIAGNOSIS — I959 Hypotension, unspecified: Secondary | ICD-10-CM | POA: Diagnosis present

## 2019-04-20 DIAGNOSIS — Z794 Long term (current) use of insulin: Secondary | ICD-10-CM

## 2019-04-20 DIAGNOSIS — R55 Syncope and collapse: Secondary | ICD-10-CM | POA: Diagnosis present

## 2019-04-20 DIAGNOSIS — E1151 Type 2 diabetes mellitus with diabetic peripheral angiopathy without gangrene: Secondary | ICD-10-CM | POA: Diagnosis present

## 2019-04-20 DIAGNOSIS — E860 Dehydration: Secondary | ICD-10-CM | POA: Diagnosis present

## 2019-04-20 DIAGNOSIS — A044 Other intestinal Escherichia coli infections: Secondary | ICD-10-CM | POA: Diagnosis not present

## 2019-04-20 DIAGNOSIS — N186 End stage renal disease: Secondary | ICD-10-CM | POA: Diagnosis present

## 2019-04-20 DIAGNOSIS — I708 Atherosclerosis of other arteries: Secondary | ICD-10-CM | POA: Diagnosis present

## 2019-04-20 DIAGNOSIS — Z820 Family history of epilepsy and other diseases of the nervous system: Secondary | ICD-10-CM

## 2019-04-20 DIAGNOSIS — Z20828 Contact with and (suspected) exposure to other viral communicable diseases: Secondary | ICD-10-CM | POA: Diagnosis present

## 2019-04-20 DIAGNOSIS — R112 Nausea with vomiting, unspecified: Secondary | ICD-10-CM

## 2019-04-20 DIAGNOSIS — Z880 Allergy status to penicillin: Secondary | ICD-10-CM

## 2019-04-20 DIAGNOSIS — Z905 Acquired absence of kidney: Secondary | ICD-10-CM

## 2019-04-20 DIAGNOSIS — Z7982 Long term (current) use of aspirin: Secondary | ICD-10-CM

## 2019-04-20 DIAGNOSIS — Z85528 Personal history of other malignant neoplasm of kidney: Secondary | ICD-10-CM

## 2019-04-20 DIAGNOSIS — R197 Diarrhea, unspecified: Secondary | ICD-10-CM | POA: Diagnosis not present

## 2019-04-20 DIAGNOSIS — I5032 Chronic diastolic (congestive) heart failure: Secondary | ICD-10-CM | POA: Diagnosis present

## 2019-04-20 DIAGNOSIS — Z992 Dependence on renal dialysis: Secondary | ICD-10-CM

## 2019-04-20 DIAGNOSIS — E669 Obesity, unspecified: Secondary | ICD-10-CM

## 2019-04-20 DIAGNOSIS — I480 Paroxysmal atrial fibrillation: Secondary | ICD-10-CM | POA: Insufficient documentation

## 2019-04-20 DIAGNOSIS — I472 Ventricular tachycardia: Secondary | ICD-10-CM

## 2019-04-20 DIAGNOSIS — Z86718 Personal history of other venous thrombosis and embolism: Secondary | ICD-10-CM

## 2019-04-20 DIAGNOSIS — I4729 Other ventricular tachycardia: Secondary | ICD-10-CM

## 2019-04-20 DIAGNOSIS — E1169 Type 2 diabetes mellitus with other specified complication: Secondary | ICD-10-CM | POA: Diagnosis not present

## 2019-04-20 DIAGNOSIS — N400 Enlarged prostate without lower urinary tract symptoms: Secondary | ICD-10-CM | POA: Diagnosis present

## 2019-04-20 DIAGNOSIS — I132 Hypertensive heart and chronic kidney disease with heart failure and with stage 5 chronic kidney disease, or end stage renal disease: Secondary | ICD-10-CM | POA: Diagnosis present

## 2019-04-20 DIAGNOSIS — K219 Gastro-esophageal reflux disease without esophagitis: Secondary | ICD-10-CM | POA: Diagnosis present

## 2019-04-20 DIAGNOSIS — E785 Hyperlipidemia, unspecified: Secondary | ICD-10-CM | POA: Diagnosis present

## 2019-04-20 DIAGNOSIS — D649 Anemia, unspecified: Secondary | ICD-10-CM | POA: Diagnosis present

## 2019-04-20 DIAGNOSIS — Z8249 Family history of ischemic heart disease and other diseases of the circulatory system: Secondary | ICD-10-CM

## 2019-04-20 DIAGNOSIS — N2581 Secondary hyperparathyroidism of renal origin: Secondary | ICD-10-CM | POA: Diagnosis present

## 2019-04-20 DIAGNOSIS — Z6836 Body mass index (BMI) 36.0-36.9, adult: Secondary | ICD-10-CM

## 2019-04-20 DIAGNOSIS — Z9989 Dependence on other enabling machines and devices: Secondary | ICD-10-CM

## 2019-04-20 DIAGNOSIS — A038 Other shigellosis: Secondary | ICD-10-CM | POA: Diagnosis present

## 2019-04-20 DIAGNOSIS — G4733 Obstructive sleep apnea (adult) (pediatric): Secondary | ICD-10-CM | POA: Diagnosis present

## 2019-04-20 DIAGNOSIS — E876 Hypokalemia: Secondary | ICD-10-CM | POA: Diagnosis present

## 2019-04-20 DIAGNOSIS — E119 Type 2 diabetes mellitus without complications: Secondary | ICD-10-CM | POA: Diagnosis present

## 2019-04-20 LAB — COMPREHENSIVE METABOLIC PANEL
ALT: 12 U/L (ref 0–44)
AST: 17 U/L (ref 15–41)
Albumin: 3.8 g/dL (ref 3.5–5.0)
Alkaline Phosphatase: 113 U/L (ref 38–126)
Anion gap: 14 (ref 5–15)
BUN: 42 mg/dL — ABNORMAL HIGH (ref 6–20)
CO2: 22 mmol/L (ref 22–32)
Calcium: 8.7 mg/dL — ABNORMAL LOW (ref 8.9–10.3)
Chloride: 100 mmol/L (ref 98–111)
Creatinine, Ser: 5.06 mg/dL — ABNORMAL HIGH (ref 0.61–1.24)
GFR calc Af Amer: 13 mL/min — ABNORMAL LOW (ref >60)
GFR calc non Af Amer: 11 mL/min — ABNORMAL LOW (ref >60)
Glucose, Bld: 131 mg/dL — ABNORMAL HIGH (ref 70–99)
Potassium: 4.2 mmol/L (ref 3.5–5.1)
Sodium: 136 mmol/L (ref 135–145)
Total Bilirubin: 0.9 mg/dL (ref 0.3–1.2)
Total Protein: 7.2 g/dL (ref 6.5–8.1)

## 2019-04-20 LAB — URINALYSIS, ROUTINE W REFLEX MICROSCOPIC
Bilirubin Urine: NEGATIVE
Glucose, UA: NEGATIVE mg/dL
Hgb urine dipstick: NEGATIVE
Ketones, ur: NEGATIVE mg/dL
Nitrite: NEGATIVE
Protein, ur: 100 mg/dL — AB
Specific Gravity, Urine: 1.015 (ref 1.005–1.030)
pH: 5 (ref 5.0–8.0)

## 2019-04-20 LAB — CBC
HCT: 40.6 % (ref 39.0–52.0)
Hemoglobin: 12.9 g/dL — ABNORMAL LOW (ref 13.0–17.0)
MCH: 27.3 pg (ref 26.0–34.0)
MCHC: 31.8 g/dL (ref 30.0–36.0)
MCV: 85.8 fL (ref 80.0–100.0)
Platelets: 234 10*3/uL (ref 150–400)
RBC: 4.73 MIL/uL (ref 4.22–5.81)
RDW: 15.7 % — ABNORMAL HIGH (ref 11.5–15.5)
WBC: 12.9 10*3/uL — ABNORMAL HIGH (ref 4.0–10.5)
nRBC: 0 % (ref 0.0–0.2)

## 2019-04-20 LAB — GLUCOSE, CAPILLARY
Glucose-Capillary: 108 mg/dL — ABNORMAL HIGH (ref 70–99)
Glucose-Capillary: 89 mg/dL (ref 70–99)

## 2019-04-20 LAB — OCCULT BLOOD X 1 CARD TO LAB, STOOL: Fecal Occult Bld: POSITIVE — AB

## 2019-04-20 LAB — LIPASE, BLOOD: Lipase: 31 U/L (ref 11–51)

## 2019-04-20 LAB — CBG MONITORING, ED
Glucose-Capillary: 124 mg/dL — ABNORMAL HIGH (ref 70–99)
Glucose-Capillary: 100 mg/dL — ABNORMAL HIGH (ref 70–99)

## 2019-04-20 LAB — MAGNESIUM: Magnesium: 1.7 mg/dL (ref 1.7–2.4)

## 2019-04-20 MED ORDER — PANTOPRAZOLE SODIUM 40 MG PO TBEC
40.0000 mg | DELAYED_RELEASE_TABLET | Freq: Every day | ORAL | Status: DC
  Administered 2019-04-20 – 2019-04-23 (×4): 40 mg via ORAL
  Filled 2019-04-20 (×4): qty 1

## 2019-04-20 MED ORDER — ACETAMINOPHEN 650 MG RE SUPP: 650.0000 mg | Freq: Four times a day (QID) | RECTAL | Status: DC | PRN

## 2019-04-20 MED ORDER — ASPIRIN EC 81 MG PO TBEC
81.0000 mg | DELAYED_RELEASE_TABLET | Freq: Every day | ORAL | Status: DC
  Administered 2019-04-20 – 2019-04-23 (×4): 81 mg via ORAL
  Filled 2019-04-20 (×4): qty 1

## 2019-04-20 MED ORDER — ONDANSETRON HCL 4 MG/2ML IJ SOLN
4.0000 mg | Freq: Four times a day (QID) | INTRAMUSCULAR | Status: DC | PRN
  Administered 2019-04-20 – 2019-04-22 (×3): 4 mg via INTRAVENOUS
  Filled 2019-04-20 (×3): qty 2

## 2019-04-20 MED ORDER — METOPROLOL TARTRATE 25 MG PO TABS: 25.0000 mg | ORAL_TABLET | ORAL | Status: DC | PRN

## 2019-04-20 MED ORDER — LOPERAMIDE HCL 2 MG PO CAPS
2.0000 mg | ORAL_CAPSULE | ORAL | Status: DC | PRN
  Administered 2019-04-20 – 2019-04-21 (×3): 2 mg via ORAL
  Filled 2019-04-20 (×3): qty 1

## 2019-04-20 MED ORDER — MAGNESIUM SULFATE 2 GM/50ML IV SOLN
2.0000 g | Freq: Once | INTRAVENOUS | Status: AC
  Administered 2019-04-20: 2 g via INTRAVENOUS
  Filled 2019-04-20: qty 50

## 2019-04-20 MED ORDER — ATORVASTATIN CALCIUM 40 MG PO TABS
40.0000 mg | ORAL_TABLET | Freq: Every evening | ORAL | Status: DC
  Administered 2019-04-20 – 2019-04-22 (×3): 40 mg via ORAL
  Filled 2019-04-20 (×4): qty 1

## 2019-04-20 MED ORDER — SODIUM CHLORIDE 0.9% FLUSH
3.0000 mL | Freq: Two times a day (BID) | INTRAVENOUS | Status: DC
  Administered 2019-04-20 – 2019-04-23 (×6): 3 mL via INTRAVENOUS

## 2019-04-20 MED ORDER — CALCIUM GLUCONATE-NACL 1-0.675 GM/50ML-% IV SOLN
1.0000 g | Freq: Once | INTRAVENOUS | Status: AC
  Administered 2019-04-20: 1000 mg via INTRAVENOUS
  Filled 2019-04-20: qty 50

## 2019-04-20 MED ORDER — INSULIN GLARGINE 100 UNIT/ML ~~LOC~~ SOLN
28.0000 [IU] | Freq: Every day | SUBCUTANEOUS | Status: DC
  Administered 2019-04-21 – 2019-04-22 (×2): 28 [IU] via SUBCUTANEOUS
  Filled 2019-04-20 (×3): qty 0.28

## 2019-04-20 MED ORDER — SODIUM CHLORIDE 0.9 % IV BOLUS
500.0000 mL | Freq: Once | INTRAVENOUS | Status: AC
  Administered 2019-04-20: 500 mL via INTRAVENOUS

## 2019-04-20 MED ORDER — SODIUM CHLORIDE 0.9 % IV SOLN: Freq: Once | INTRAVENOUS | Status: DC

## 2019-04-20 MED ORDER — SODIUM CHLORIDE 0.9% FLUSH
3.0000 mL | Freq: Once | INTRAVENOUS | Status: AC
  Administered 2019-04-20: 3 mL via INTRAVENOUS

## 2019-04-20 MED ORDER — TAMSULOSIN HCL 0.4 MG PO CAPS
0.4000 mg | ORAL_CAPSULE | Freq: Every evening | ORAL | Status: DC
  Administered 2019-04-20 – 2019-04-22 (×3): 0.4 mg via ORAL
  Filled 2019-04-20 (×4): qty 1

## 2019-04-20 MED ORDER — ENOXAPARIN SODIUM 40 MG/0.4ML ~~LOC~~ SOLN
40.0000 mg | SUBCUTANEOUS | Status: DC
  Administered 2019-04-20 – 2019-04-21 (×2): 40 mg via SUBCUTANEOUS
  Filled 2019-04-20 (×3): qty 0.4

## 2019-04-20 MED ORDER — SODIUM CHLORIDE 0.9 % IV SOLN: 1.0000 g | Freq: Once | INTRAVENOUS | Status: DC

## 2019-04-20 MED ORDER — ALBUTEROL SULFATE (2.5 MG/3ML) 0.083% IN NEBU: 2.5000 mg | INHALATION_SOLUTION | Freq: Four times a day (QID) | RESPIRATORY_TRACT | Status: DC | PRN

## 2019-04-20 MED ORDER — SODIUM CHLORIDE 0.9 % IV BOLUS
250.0000 mL | Freq: Once | INTRAVENOUS | Status: AC
  Administered 2019-04-20: 15:00:00 250 mL via INTRAVENOUS

## 2019-04-20 MED ORDER — OMEPRAZOLE MAGNESIUM 20 MG PO TBEC: 40.0000 mg | DELAYED_RELEASE_TABLET | Freq: Every morning | ORAL | Status: DC

## 2019-04-20 MED ORDER — ONDANSETRON HCL 4 MG PO TABS: 4.0000 mg | ORAL_TABLET | Freq: Four times a day (QID) | ORAL | Status: DC | PRN

## 2019-04-20 MED ORDER — ALLOPURINOL 100 MG PO TABS
100.0000 mg | ORAL_TABLET | Freq: Every evening | ORAL | Status: DC
  Administered 2019-04-20 – 2019-04-22 (×3): 100 mg via ORAL
  Filled 2019-04-20 (×4): qty 1

## 2019-04-20 MED ORDER — INSULIN ASPART 100 UNIT/ML ~~LOC~~ SOLN: 0.0000 [IU] | Freq: Three times a day (TID) | SUBCUTANEOUS | Status: DC

## 2019-04-20 MED ORDER — ACETAMINOPHEN 325 MG PO TABS: 650.0000 mg | ORAL_TABLET | Freq: Four times a day (QID) | ORAL | Status: DC | PRN

## 2019-04-20 NOTE — ED Notes (Signed)
Dinner tray delivered pt ate

## 2019-04-20 NOTE — ED Notes (Signed)
Pt moved from resus to purple 53  The pt is requesting some immodium for diarrhea x 3  Dr Tamala Julian paged  Order given for immodium

## 2019-04-20 NOTE — Progress Notes (Signed)
Patient alert oriented x4 ambulated to toilet with standby assistance. Obtained stool sample to send to lab. MRSA swab obtained. Right AC piv infusing NS at 13ml/hr. CHG/bath completed. Admission completed. Vitals stable. Oriented to unit education provided.

## 2019-04-20 NOTE — ED Triage Notes (Signed)
Pt reports diarrhea all night, temp 100.3 at home this morning, fatigued.  Dialysis pt (due today)

## 2019-04-20 NOTE — ED Notes (Signed)
Pt given  immodium  nsr on monitor  24ml nss still infusing at a slow rate pt due for dialysis today

## 2019-04-20 NOTE — ED Notes (Signed)
Report given to [employer] on 5w

## 2019-04-20 NOTE — ED Notes (Signed)
An unsuccessful attempt to call report

## 2019-04-20 NOTE — H&P (Signed)
History and Physical    George Lewis. TWS:[id number] DOB: [birth year] DOA: 04/20/2019  Referring MD/NP/PA: Pattricia Boss, MD PCP: Esaw Grandchild, NP  Patient coming from: Home  Chief Complaint: Diarrhea  I have personally briefly reviewed patient's old medical records in [place]   HPI: George Olliff. is a 63 y.o. male with medical history significant of right renal cell carcinoma s/p nephrectomy in 9/19, ESRD on HD (TTS), HTN, HLD, SVT, DM type II, chronic diastolic CHF, OSA on CPAP, and morbid obesity; who presents with complaints of diarrhea starting late yesterday evening.  He denies eating anything out of the norm or recent antibiotic use.  Reports having at least 12 episodes of diarrhea throughout the night.  Denies having any blood in stool, abdominal pain, chest pain, vomiting, falls, or loss of consciousness.  However, when standing up he reports feeling very lightheaded as though he could pass out.  Other associated symptoms include mild shortness of breath and weight loss of 67 pounds or so since being started on hemodialysis.  At home this morning had been noted to have temperature up to 100.3 F.  He had gone to hemodialysis last on 8/20, requiring IV fluids due to low blood pressures and he received his pneumonia vaccine.  They had also recommended that he increase his amount of fluid intake from 36 ounces to 48 ounces.  Last episode of diarrhea occurred around 5 AM this morning but he still feels the urge to have to use restroom.  ED Course: Upon admission into the emergency department patient was noted to be afebrile, blood pressure 93/58-96/56, telemetry noted episode of nonsustained V. tach, and O2 saturations maintained on room air.  Labs significant for WBC 12.9, hemoglobin 12.9, potassium 4.2, BUN 42, and creatinine 5.06.  Patient was given 250 mL of normal saline IV fluids and 1 g of calcium gluconate.  TRH called to admit.    Past Medical History:   Diagnosis Date  . Arthritis    knees  . Bilateral renal cysts 03/23/2018   Noted MRI ABD  . Cholelithiasis 03/19/2018   noted on CT AB/Pelvis  . CKD (chronic kidney disease), stage IV [facility])    nephrologist-- dr Hollie Salk [name]);  05-01-2018 has not started dialysis, scheduled for AV fistula creation 05-07-2018  . Diverticulosis of colon 04/19/2018   Noted on CT abd/pelvis  . GERD (gastroesophageal reflux disease)   . Hepatic steatosis 03/19/2018   Mild diffuse, noted on CT AB/Pelvis  . History of kidney stones   . History of pulmonary embolus (PE) 03/2008   treated with coumadin for 6 months  . History of sepsis 04/20/2018   per discharge note , probable UTI  . Hypertension   . Nocturia   . OSA on CPAP   . Pancreatic lesion    0.4cm cystic per CT 07/ 2019  . Pneumonia   . Pulmonary nodule    Solid 36m Right Lower Lobe  . Right renal mass 04/10/2018   new dx--  scheduled for nephrectomy 05-16-2018  . S/P ureteral stent placement: 04/16/2018 04/19/2018    Past Surgical History:  Procedure Laterality Date  . AV FISTULA PLACEMENT Left 05/07/2018   Procedure: Creation of Left arm Radiocephalic Fistula;  Surgeon: CMarty Heck MD;  [place]  Service: Vascular;  Laterality: Left;  . CYSTOSCOPY/RETROGRADE/URETEROSCOPY/STONE EXTRACTION WITH BASKET  x2 last one 1990s approx.  . CYSTOSCOPY/URETEROSCOPY/HOLMIUM LASER/STENT PLACEMENT Left 04/16/2018   Procedure: CYSTOSCOPY/LEFT URETEROSCOPY/LEFT RETROGRADE/STENT  PLACEMENT;  Surgeon: Lucas Mallow, MD;  [facility];  Service: Urology;  Laterality: Left;  . EUS N/A 05/03/2018   Procedure: UPPER ENDOSCOPIC ULTRASOUND (EUS) RADIAL;  Surgeon: Milus Banister, MD;  [facility];  Service: Gastroenterology;  Laterality: N/A;  . EUS N/A 05/03/2018   Procedure: UPPER ENDOSCOPIC ULTRASOUND (EUS) LINEAR;  Surgeon: Milus Banister, MD;  [facility];  Service: Gastroenterology;  Laterality: N/A;  .  EXTRACORPOREAL SHOCK WAVE LITHOTRIPSY  x3  last one 2004 approx.  Marland Kitchen FINE NEEDLE ASPIRATION N/A 05/03/2018   Procedure: FINE NEEDLE ASPIRATION (FNA) LINEAR;  Surgeon: Milus Banister, MD;  [facility];  Service: Gastroenterology;  Laterality: N/A;  . LAPAROSCOPIC NEPHRECTOMY, HAND ASSISTED Right 05/16/2018   Procedure: HAND ASSISTED LAPAROSCOPIC RIGHT NEPHRECTOMY;  Surgeon: Lucas Mallow, MD;  [facility];  Service: Urology;  Laterality: Right;  . left index finger attachment  1980s   3-4 surgeries  . TOE SURGERY  1980s   in beween 2nd and 3rd toes cyst removed     reports that he has never smoked. He has never used smokeless tobacco. He reports that he does not drink alcohol or use drugs.  Allergies  Allergen Reactions  . Penicillins Rash and Other (See Comments)    Has patient had a PCN reaction causing immediate rash, facial/tongue/throat swelling, SOB or lightheadedness with hypotension: yes Has patient had a PCN reaction causing severe rash involving mucus membranes or skin necrosis: no Has patient had a PCN reaction that required hospitalization: no Has patient had a PCN reaction occurring within the last 10 years: no If all of the above answers are "NO", then may proceed with Cephalosporin use.     Family History  Problem Relation Age of Onset  . Alzheimer's disease Mother   . Alzheimer's disease Father   . Heart disease Father   . Heart attack Father   . Cancer - Other Sister     Prior to Admission medications   Medication Sig Start Date End Date Taking? Authorizing Provider  acetaminophen (TYLENOL) 500 MG tablet Take 500 mg by mouth every 6 (six) hours as needed for mild pain or headache.   Yes [provider]  allopurinol (ZYLOPRIM) 100 MG tablet Take 1 tablet (100 mg total) by mouth every evening. 12/22/18  Yes Danford, Suann Larry, MD  aspirin EC 81 MG EC tablet Take 1 tablet (81 mg total) by mouth daily. 03/08/19  Yes Swayze, Ava, DO   atorvastatin (LIPITOR) 40 MG tablet Take 1 tablet (40 mg total) by mouth daily. Patient taking differently: Take 40 mg by mouth every evening.  03/08/19  Yes Swayze, Ava, DO  fluticasone (FLONASE) 50 MCG/ACT nasal spray Place 1 spray into both nostrils daily as needed for allergies or rhinitis.   Yes [provider]  insulin glargine (LANTUS) 100 UNIT/ML injection Inject 0.32 mLs (32 Units total) into the skin at bedtime. Patient taking differently: Inject 28 Units into the skin at bedtime.  02/11/19  Yes Philemon Kingdom, MD  loratadine (CLARITIN) 10 MG tablet Take 10 mg by mouth every evening.    Yes [provider]  metoprolol tartrate (LOPRESSOR) 25 MG tablet Take 25 mg by mouth as needed (>130).    Yes [provider]  nitroGLYCERIN (NITROSTAT) 0.4 MG SL tablet Place 1 tablet (0.4 mg total) under the tongue every 5 (five) minutes as needed for chest pain. 03/07/19  Yes Swayze, Ava, DO  omeprazole (PRILOSEC  OTC) 20 MG tablet Take 40 mg by mouth every morning.    Yes [provider]  tamsulosin (FLOMAX) 0.4 MG CAPS capsule Take 0.4 mg by mouth every evening.  03/08/18  Yes [provider]  blood glucose meter kit and supplies Dispense based on patient and insurance preference. Use up to four times daily as directed. (FOR ICD-10 E10.9, E11.9). 12/22/18   Danford, Suann Larry, MD  Continuous Blood Gluc Sensor (FREESTYLE LIBRE 14 DAY SENSOR) MISC 1 each by Does not apply route every 14 (fourteen) days. Change every 2 weeks 02/11/19   Philemon Kingdom, MD    Physical Exam:  Constitutional: Obese male in NAD, calm, comfortable Vitals:   04/20/19 0800 04/20/19 0900 04/20/19 0915 04/20/19 0930  BP: (!) 96/56 (!) '93/58 94/60 95/60 '  Pulse: 82 82 85 83  Resp: (!) '26 10 11 11  ' Temp:      TempSrc:      SpO2: 94% 98% 98% 98%   Eyes: PERRL, lids and conjunctivae normal ENMT: Mucous membranes are dry. Posterior pharynx clear of any exudate or lesions.  Neck:  normal, supple, no masses, no thyromegaly Respiratory: clear to auscultation bilaterally, no wheezing, no crackles. Normal respiratory effort. No accessory muscle use.  Cardiovascular: Regular rate and rhythm, no murmurs / rubs / gallops. No extremity edema. 2+ pedal pulses. No carotid bruits.  Vascular access of the right upper chest wall. Abdomen: no tenderness, no masses palpated. No hepatosplenomegaly. Bowel sounds positive.  Musculoskeletal: no clubbing / cyanosis. No joint deformity upper and lower extremities. Good ROM, no contractures. Normal muscle tone.  Skin: no rashes, lesions, ulcers. No induration Neurologic: CN 2-12 grossly intact. Sensation intact, DTR normal. Strength 5/5 in all 4.  Psychiatric: Normal judgment and insight. Alert and oriented x 3. Normal mood.     Labs on Admission: I have personally reviewed following labs and imaging studies  CBC: Recent Labs  Lab 04/20/19 0755  WBC 12.9*  HGB 12.9*  HCT 40.6  MCV 85.8  PLT 585   Basic Metabolic Panel: Recent Labs  Lab 04/20/19 0755  NA 136  K 4.2  CL 100  CO2 22  GLUCOSE 131*  BUN 42*  CREATININE 5.06*  CALCIUM 8.7*   GFR: CrCl cannot be calculated (Unknown ideal weight.). Liver Function Tests: Recent Labs  Lab 04/20/19 0755  AST 17  ALT 12  ALKPHOS 113  BILITOT 0.9  PROT 7.2  ALBUMIN 3.8   Recent Labs  Lab 04/20/19 0755  LIPASE 31   No results for input(s): AMMONIA in the last 168 hours. Coagulation Profile: No results for input(s): INR, PROTIME in the last 168 hours. Cardiac Enzymes: No results for input(s): CKTOTAL, CKMB, CKMBINDEX, TROPONINI in the last 168 hours. BNP (last 3 results) No results for input(s): PROBNP in the last 8760 hours. HbA1C: No results for input(s): HGBA1C in the last 72 hours. CBG: Recent Labs  Lab 04/20/19 0713  GLUCAP 124*   Lipid Profile: No results for input(s): CHOL, HDL, LDLCALC, TRIG, CHOLHDL, LDLDIRECT in the last 72 hours. Thyroid Function  Tests: No results for input(s): TSH, T4TOTAL, FREET4, T3FREE, THYROIDAB in the last 72 hours. Anemia Panel: No results for input(s): VITAMINB12, FOLATE, FERRITIN, TIBC, IRON, RETICCTPCT in the last 72 hours. Urine analysis:    Component Value Date/Time   COLORURINE STRAW (A) 12/19/2018 0305   APPEARANCEUR CLEAR 12/19/2018 0305   LABSPEC 1.014 12/19/2018 0305   PHURINE 6.0 12/19/2018 0305   GLUCOSEU >=500 (A) 12/19/2018 0305  HGBUR SMALL (A) 12/19/2018 0305   BILIRUBINUR NEGATIVE 12/19/2018 0305   KETONESUR NEGATIVE 12/19/2018 0305   PROTEINUR 30 (A) 12/19/2018 0305   NITRITE NEGATIVE 12/19/2018 0305   LEUKOCYTESUR SMALL (A) 12/19/2018 0305   Sepsis Labs: No results found for this or any previous visit (from the past 240 hour(s)).   Radiological Exams on Admission: No results found.  EKG: Independently reviewed.  Sinus tachycardia with PVCs 126 bpm  Assessment/Plan Near syncope and diarrhea: Acute.  Patient presents with complaints of diarrhea overnight.  Patient has been on a restricted fluid diet, but had recently been increased due to low blood pressure, last hemodialysis session.  Suspect combination of diarrhea along with decreased fluid intake causing near syncope symptoms.  Diarrhea likely secondary to a viral gastroenteritis. -Admit to a medical telemetry -Strict Intake and output -Bolus 500 mL of normal saline IV fluids, then placed on a rate of 50 mL/h x 10 hours -Check orthostatic vital signs every 12 hours x10 -Imodium prn -Check stool studies if diarrhea symptoms persist  Nonsustained SVT: Patient had episode of non-SVT on the emergency department.  He was supposed to follow-up with cardiology for stress testing, but was never called with an appointment.  Dr. Acie Fredrickson notified of the need of a follow-up appointment to be made for the patient for stress testing in outpatient setting.   -Check magnesium level and replace if needed -Continue metoprolol as needed  -Follow-up to ensure patient has a follow-up appointment for stress testing in outpatient setting  ESRD on HD: Patient normally dialyzes TTS.  Nephrology to be consulted. -HD per nephrology  Diabetes mellitus type 2 in obese: Last hemoglobin A1c noted to be 7.4 in June of this year.  Home medications include Lantus 28 units nightly. -Hypoglycemic protocol -Restart home Lantus tomorrow if still hospitalized -CBGs q. AC with sensitive SSI  Hyperlipidemia -Continue atorvastatin  BPH -Continue Flomax  History of renal cell carcinoma status post nephrectomy  History of gout: Patient without any signs of gout flare. -Continue allopurinol  OSA on CPAP -Continue CPAP per RT  GERD -Continue Prilosec  DVT prophylaxis: Heparin Code Status: Full Family Communication: Discussed plan of care with patient and family present at bedside Disposition Plan: Possible discharge home in a.m. Consults called: Nephrology Admission status: Observation  Norval Morton MD [facility] Pager [phone]   If 7PM-7AM, please contact night-coverage [email or web address] Password [facility]  04/20/2019, 10:28 AM

## 2019-04-20 NOTE — ED Notes (Signed)
PAGED ADMITTING PER RN  

## 2019-04-20 NOTE — ED Notes (Signed)
Pt called out, stating he was getting ready to pass out, pt pale, diaphoretic, very weak. Placed on stretcher, taken to treatment room.

## 2019-04-20 NOTE — ED Notes (Signed)
No urine  The pt only voids x2 times a day

## 2019-04-20 NOTE — ED Provider Notes (Addendum)
[facility] EMERGENCY DEPARTMENT Provider Note   CSN: [id number] Arrival date & time: 04/20/19  5701     History   Chief Complaint Chief Complaint  Patient presents with  . Diarrhea    HPI George Lewis. is a 63 y.o. male.     HPI 63 year old male history of kidney cancer, on dialysis Tuesday, Thursday, and Saturdays presents today with diarrhea that began yesterday.  He states that he was volume depleted when he presented for dialysis on Thursday and actually had to be given fluids.  He states that they felt that his insensate loss of fluids was higher due to working outside.  They increased his fluid intake from 32 to 48 ounces.  He drank the higher amount of fluid yesterday.  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  He then began having loose bowel movements.  He describes these as brown and runny.  He states that occurs with any p.o. intake.  He denies being on any recent antibiotics, fever, chills, nausea, vomiting, or sick exposures.  He continues to feel lightheaded.  He denies any chest pain, cough, or abdominal pain. Past Medical History:  Diagnosis Date  . Arthritis    knees  . Bilateral renal cysts 03/23/2018   Noted MRI ABD  . Cholelithiasis 03/19/2018   noted on CT AB/Pelvis  . CKD (chronic kidney disease), stage IV [facility])    nephrologist-- dr Hollie Salk [name]);  05-01-2018 has not started dialysis, scheduled for AV fistula creation 05-07-2018  . Diverticulosis of colon 04/19/2018   Noted on CT abd/pelvis  . GERD (gastroesophageal reflux disease)   . Hepatic steatosis 03/19/2018   Mild diffuse, noted on CT AB/Pelvis  . History of kidney stones   . History of pulmonary embolus (PE) 03/2008   treated with coumadin for 6 months  . History of sepsis 04/20/2018   per discharge note , probable UTI  .  Hypertension   . Nocturia   . OSA on CPAP   . Pancreatic lesion    0.4cm cystic per CT 07/ 2019  . Pneumonia   . Pulmonary nodule    Solid 47m Right Lower Lobe  . Right renal mass 04/10/2018   new dx--  scheduled for nephrectomy 05-16-2018  . S/P ureteral stent placement: 04/16/2018 04/19/2018    Patient Active Problem List   Diagnosis Date Noted  . Chest pain 03/06/2019  . Diabetes mellitus type 2 in obese ([place] 03/06/2019  . Obesity (BMI 30-39.9) 03/06/2019  . Healthcare maintenance 01/28/2019  . DKA (diabetic ketoacidoses) ([place] 12/19/2018  . Newly diagnosed diabetes ([place] 12/19/2018  . Syncope 06/12/2018  . Ileus ([place]   . SVT (supraventricular tachycardia) ([place]   . Renal mass, right 05/16/2018  . Mass of pancreas   . Abnormal CT of the abdomen   . Bacteremia due to Enterococcus 04/23/2018  . Sepsis ([place] 04/19/2018  . Acute renal failure superimposed on stage 4 chronic kidney disease ([facility] 04/19/2018  . Dehydration 04/19/2018  . UTI (urinary tract infection): Probable 04/19/2018  . Leukocytosis 04/19/2018  . Anemia 04/19/2018  . Renal cell carcinoma, right ([place] 04/19/2018  . S/P ureteral stent placement: 04/16/2018 04/19/2018  . GERD (gastroesophageal reflux disease) 04/19/2018  . OSA on CPAP 04/19/2018  . HTN (hypertension) 04/19/2018    Past Surgical History:  Procedure Laterality  Date  . AV FISTULA PLACEMENT Left 05/07/2018   Procedure: Creation of Left arm Radiocephalic Fistula;  Surgeon: Marty Heck, MD;  [facility];  Service: Vascular;  Laterality: Left;  . CYSTOSCOPY/RETROGRADE/URETEROSCOPY/STONE EXTRACTION WITH BASKET  x2 last one 1990s approx.  . CYSTOSCOPY/URETEROSCOPY/HOLMIUM LASER/STENT PLACEMENT Left 04/16/2018   Procedure: CYSTOSCOPY/LEFT URETEROSCOPY/LEFT RETROGRADE/STENT PLACEMENT;  Surgeon: Lucas Mallow, MD;  [facility];  Service: Urology;  Laterality: Left;  . EUS N/A 05/03/2018   Procedure: UPPER ENDOSCOPIC ULTRASOUND (EUS)  RADIAL;  Surgeon: Milus Banister, MD;  [facility];  Service: Gastroenterology;  Laterality: N/A;  . EUS N/A 05/03/2018   Procedure: UPPER ENDOSCOPIC ULTRASOUND (EUS) LINEAR;  Surgeon: Milus Banister, MD;  [facility];  Service: Gastroenterology;  Laterality: N/A;  . EXTRACORPOREAL SHOCK WAVE LITHOTRIPSY  x3  last one 2004 approx.  Marland Kitchen FINE NEEDLE ASPIRATION N/A 05/03/2018   Procedure: FINE NEEDLE ASPIRATION (FNA) LINEAR;  Surgeon: Milus Banister, MD;  [facility];  Service: Gastroenterology;  Laterality: N/A;  . LAPAROSCOPIC NEPHRECTOMY, HAND ASSISTED Right 05/16/2018   Procedure: HAND ASSISTED LAPAROSCOPIC RIGHT NEPHRECTOMY;  Surgeon: Lucas Mallow, MD;  [facility];  Service: Urology;  Laterality: Right;  . left index finger attachment  1980s   3-4 surgeries  . TOE SURGERY  1980s   in beween 2nd and 3rd toes cyst removed        Home Medications    Prior to Admission medications   Medication Sig Start Date End Date Taking? Authorizing Provider  acetaminophen (TYLENOL) 500 MG tablet Take 500 mg by mouth every 6 (six) hours as needed for mild pain or headache.   Yes [provider]  allopurinol (ZYLOPRIM) 100 MG tablet Take 1 tablet (100 mg total) by mouth every evening. 12/22/18  Yes Danford, Suann Larry, MD  aspirin EC 81 MG EC tablet Take 1 tablet (81 mg total) by mouth daily. 03/08/19  Yes Swayze, Ava, DO  atorvastatin (LIPITOR) 40 MG tablet Take 1 tablet (40 mg total) by mouth daily. Patient taking differently: Take 40 mg by mouth every evening.  03/08/19  Yes Swayze, Ava, DO  fluticasone (FLONASE) 50 MCG/ACT nasal spray Place 1 spray into both nostrils daily as needed for allergies or rhinitis.   Yes [provider]  insulin glargine (LANTUS) 100 UNIT/ML injection Inject 0.32 mLs (32 Units total) into the skin at bedtime. Patient taking differently: Inject 28 Units into the skin at bedtime.  02/11/19  Yes Philemon Kingdom,  MD  loratadine (CLARITIN) 10 MG tablet Take 10 mg by mouth every evening.    Yes [provider]  metoprolol tartrate (LOPRESSOR) 25 MG tablet Take 25 mg by mouth as needed (>130).    Yes [provider]  nitroGLYCERIN (NITROSTAT) 0.4 MG SL tablet Place 1 tablet (0.4 mg total) under the tongue every 5 (five) minutes as needed for chest pain. 03/07/19  Yes Swayze, Ava, DO  omeprazole (PRILOSEC OTC) 20 MG tablet Take 40 mg by mouth every morning.    Yes [provider]  tamsulosin (FLOMAX) 0.4 MG CAPS capsule Take 0.4 mg by mouth every evening.  03/08/18  Yes [provider]  blood glucose meter kit and supplies Dispense based on patient and insurance preference. Use up to four times daily as directed. (FOR ICD-10 E10.9, E11.9). 12/22/18   Danford, Suann Larry, MD  Continuous Blood Gluc Sensor (FREESTYLE LIBRE 14 DAY SENSOR) MISC 1 each by Does not apply  route every 14 (fourteen) days. Change every 2 weeks 02/11/19   Philemon Kingdom, MD    Family History Family History  Problem Relation Age of Onset  . Alzheimer's disease Mother   . Alzheimer's disease Father   . Heart disease Father   . Heart attack Father   . Cancer - Other Sister     Social History Social History   Tobacco Use  . Smoking status: Never Smoker  . Smokeless tobacco: Never Used  Substance Use Topics  . Alcohol use: Never    Frequency: Never  . Drug use: Never     Allergies   Penicillins   Review of Systems Review of Systems  All other systems reviewed and are negative.    Physical Exam Updated Vital Signs BP (!) 94/58   Pulse 87   Temp 98.6 F (37 C) (Oral)   Resp 12   SpO2 98%   Physical Exam Vitals signs and nursing note reviewed.  Constitutional:      General: He is not in acute distress.    Appearance: Normal appearance. He is obese. He is not ill-appearing.  HENT:     Head: Normocephalic and atraumatic.     Right Ear: External ear normal.     Left Ear:  External ear normal.     Nose: Nose normal.     Mouth/Throat:     Mouth: Mucous membranes are moist.  Eyes:     Extraocular Movements: Extraocular movements intact.     Pupils: Pupils are equal, round, and reactive to light.  Neck:     Musculoskeletal: Normal range of motion.  Cardiovascular:     Rate and Rhythm: Normal rate and regular rhythm.     Pulses: Normal pulses.     Heart sounds: Normal heart sounds.     Comments: Dialysis catheter right chest wall Pulmonary:     Effort: Pulmonary effort is normal.     Breath sounds: Normal breath sounds.  Abdominal:     General: Abdomen is flat.     Palpations: Abdomen is soft.  Musculoskeletal: Normal range of motion.  Skin:    General: Skin is warm and dry.     Capillary Refill: Capillary refill takes less than 2 seconds.  Neurological:     General: No focal deficit present.     Mental Status: He is alert and oriented to person, place, and time.  Psychiatric:        Mood and Affect: Mood normal.        Behavior: Behavior normal.      ED Treatments / Results  Labs (all labs ordered are listed, but only abnormal results are displayed) Labs Reviewed  CBG MONITORING, ED - Abnormal; Notable for the following components:      Result Value   Glucose-Capillary 124 (*)    All other components within normal limits  LIPASE, BLOOD  COMPREHENSIVE METABOLIC PANEL  CBC  URINALYSIS, ROUTINE W REFLEX MICROSCOPIC  OCCULT BLOOD X 1 CARD TO LAB, STOOL    EKG EKG Interpretation  Date/Time:  Saturday April 20 2019 07:01:10 EDT Ventricular Rate:  126 PR Interval:  132 QRS Duration: 86 QT Interval:  312 QTC Calculation: 451 R Axis:   2 Text Interpretation:  Sinus tachycardia with occasional Premature ventricular complexes Otherwise normal ECG Confirmed by Pattricia Boss [phone]) on 04/20/2019 10:01:01 AM   Radiology No results found.  Procedures Procedures (including critical care time)  Medications Ordered in ED Medications   sodium chloride flush (  NS) 0.9 % injection 3 mL (has no administration in time range)     Initial Impression / Assessment and Plan / ED Course  I have reviewed the triage vital signs and the nursing notes.  Pertinent labs & imaging results that were available during my care of the patient were reviewed by me and considered in my medical decision making (see chart for details).    63 yo male on dialysis with hypotension and lightheadedness presents today due to episodic hypotension for the past several days.  Dialysis was held Thursday due to low bp and felt to need to increase some volume.  Patient with run of wide-complex tachycardia occurred at 0 830 he remained hemodynamically stable.  Calcium was ordered as at that time potassium was not back.  potassium returned prior to calcium being given calcium held 63 year old male on dialysis presents today with diarrhea, weakness, and hypotension.  Patient had run of wide-complex tachycardia here in the emergency department.  Plan admission for observation and further treatment Review of records reveals patient had run of v tach last admission and was to have op myoview.  Per the patient and wife, no further testing was done as they were not contacted yet.  Final Clinical Impressions(s) / ED Diagnoses   Final diagnoses:  None    ED Discharge Orders    None       Pattricia Boss, MD 04/21/19 [place]    Pattricia Boss, MD 04/21/19 [phone]

## 2019-04-21 DIAGNOSIS — R55 Syncope and collapse: Secondary | ICD-10-CM | POA: Diagnosis not present

## 2019-04-21 DIAGNOSIS — E1151 Type 2 diabetes mellitus with diabetic peripheral angiopathy without gangrene: Secondary | ICD-10-CM | POA: Diagnosis present

## 2019-04-21 DIAGNOSIS — Z905 Acquired absence of kidney: Secondary | ICD-10-CM | POA: Diagnosis not present

## 2019-04-21 DIAGNOSIS — N400 Enlarged prostate without lower urinary tract symptoms: Secondary | ICD-10-CM | POA: Diagnosis present

## 2019-04-21 DIAGNOSIS — I480 Paroxysmal atrial fibrillation: Secondary | ICD-10-CM | POA: Diagnosis present

## 2019-04-21 DIAGNOSIS — N186 End stage renal disease: Secondary | ICD-10-CM | POA: Diagnosis present

## 2019-04-21 DIAGNOSIS — A038 Other shigellosis: Secondary | ICD-10-CM | POA: Diagnosis present

## 2019-04-21 DIAGNOSIS — A09 Infectious gastroenteritis and colitis, unspecified: Secondary | ICD-10-CM | POA: Diagnosis not present

## 2019-04-21 DIAGNOSIS — Z20828 Contact with and (suspected) exposure to other viral communicable diseases: Secondary | ICD-10-CM | POA: Diagnosis present

## 2019-04-21 DIAGNOSIS — Z794 Long term (current) use of insulin: Secondary | ICD-10-CM | POA: Diagnosis not present

## 2019-04-21 DIAGNOSIS — R197 Diarrhea, unspecified: Secondary | ICD-10-CM | POA: Diagnosis not present

## 2019-04-21 DIAGNOSIS — E1169 Type 2 diabetes mellitus with other specified complication: Secondary | ICD-10-CM | POA: Diagnosis not present

## 2019-04-21 DIAGNOSIS — Z7982 Long term (current) use of aspirin: Secondary | ICD-10-CM | POA: Diagnosis not present

## 2019-04-21 DIAGNOSIS — Z992 Dependence on renal dialysis: Secondary | ICD-10-CM | POA: Diagnosis not present

## 2019-04-21 DIAGNOSIS — G4733 Obstructive sleep apnea (adult) (pediatric): Secondary | ICD-10-CM | POA: Diagnosis present

## 2019-04-21 DIAGNOSIS — E785 Hyperlipidemia, unspecified: Secondary | ICD-10-CM | POA: Diagnosis present

## 2019-04-21 DIAGNOSIS — K219 Gastro-esophageal reflux disease without esophagitis: Secondary | ICD-10-CM | POA: Diagnosis present

## 2019-04-21 DIAGNOSIS — E860 Dehydration: Secondary | ICD-10-CM | POA: Diagnosis present

## 2019-04-21 DIAGNOSIS — Z85528 Personal history of other malignant neoplasm of kidney: Secondary | ICD-10-CM | POA: Diagnosis not present

## 2019-04-21 DIAGNOSIS — D649 Anemia, unspecified: Secondary | ICD-10-CM | POA: Diagnosis present

## 2019-04-21 DIAGNOSIS — I708 Atherosclerosis of other arteries: Secondary | ICD-10-CM | POA: Diagnosis present

## 2019-04-21 DIAGNOSIS — E876 Hypokalemia: Secondary | ICD-10-CM | POA: Diagnosis present

## 2019-04-21 DIAGNOSIS — I132 Hypertensive heart and chronic kidney disease with heart failure and with stage 5 chronic kidney disease, or end stage renal disease: Secondary | ICD-10-CM | POA: Diagnosis present

## 2019-04-21 DIAGNOSIS — I5032 Chronic diastolic (congestive) heart failure: Secondary | ICD-10-CM | POA: Diagnosis present

## 2019-04-21 DIAGNOSIS — N2581 Secondary hyperparathyroidism of renal origin: Secondary | ICD-10-CM | POA: Diagnosis present

## 2019-04-21 DIAGNOSIS — A044 Other intestinal Escherichia coli infections: Secondary | ICD-10-CM | POA: Diagnosis present

## 2019-04-21 DIAGNOSIS — E1122 Type 2 diabetes mellitus with diabetic chronic kidney disease: Secondary | ICD-10-CM | POA: Diagnosis present

## 2019-04-21 LAB — C DIFFICILE QUICK SCREEN W PCR REFLEX
C Diff antigen: NEGATIVE
C Diff interpretation: NOT DETECTED
C Diff toxin: NEGATIVE

## 2019-04-21 LAB — GASTROINTESTINAL PANEL BY PCR, STOOL (REPLACES STOOL CULTURE)
Adenovirus F40/41: NOT DETECTED
Astrovirus: NOT DETECTED
Campylobacter species: NOT DETECTED
Cryptosporidium: NOT DETECTED
Cyclospora cayetanensis: NOT DETECTED
E. coli O157: NOT DETECTED
Entamoeba histolytica: NOT DETECTED
Enteroaggregative E coli (EAEC): NOT DETECTED
Enterotoxigenic E coli (ETEC): NOT DETECTED
Giardia lamblia: NOT DETECTED
Norovirus GI/GII: NOT DETECTED
Plesimonas shigelloides: DETECTED — AB
Rotavirus A: NOT DETECTED
Salmonella species: NOT DETECTED
Sapovirus (I, II, IV, and V): NOT DETECTED
Shiga like toxin producing E coli (STEC): DETECTED — AB
Shigella/Enteroinvasive E coli (EIEC): DETECTED — AB
Vibrio cholerae: NOT DETECTED
Vibrio species: NOT DETECTED
Yersinia enterocolitica: NOT DETECTED

## 2019-04-21 LAB — CBC
HCT: 38.4 % — ABNORMAL LOW (ref 39.0–52.0)
Hemoglobin: 12.1 g/dL — ABNORMAL LOW (ref 13.0–17.0)
MCH: 26.6 pg (ref 26.0–34.0)
MCHC: 31.5 g/dL (ref 30.0–36.0)
MCV: 84.4 fL (ref 80.0–100.0)
Platelets: 187 10*3/uL (ref 150–400)
RBC: 4.55 MIL/uL (ref 4.22–5.81)
RDW: 15.9 % — ABNORMAL HIGH (ref 11.5–15.5)
WBC: 7.7 10*3/uL (ref 4.0–10.5)
nRBC: 0 % (ref 0.0–0.2)

## 2019-04-21 LAB — BASIC METABOLIC PANEL
Anion gap: 16 — ABNORMAL HIGH (ref 5–15)
Anion gap: 15 (ref 5–15)
BUN: 55 mg/dL — ABNORMAL HIGH (ref 6–20)
BUN: 61 mg/dL — ABNORMAL HIGH (ref 6–20)
CO2: 19 mmol/L — ABNORMAL LOW (ref 22–32)
CO2: 19 mmol/L — ABNORMAL LOW (ref 22–32)
Calcium: 8.3 mg/dL — ABNORMAL LOW (ref 8.9–10.3)
Calcium: 8.3 mg/dL — ABNORMAL LOW (ref 8.9–10.3)
Chloride: 102 mmol/L (ref 98–111)
Chloride: 102 mmol/L (ref 98–111)
Creatinine, Ser: 5.19 mg/dL — ABNORMAL HIGH (ref 0.61–1.24)
Creatinine, Ser: 5.13 mg/dL — ABNORMAL HIGH (ref 0.61–1.24)
GFR calc Af Amer: 13 mL/min — ABNORMAL LOW (ref >60)
GFR calc Af Amer: 13 mL/min — ABNORMAL LOW (ref >60)
GFR calc non Af Amer: 11 mL/min — ABNORMAL LOW (ref >60)
GFR calc non Af Amer: 11 mL/min — ABNORMAL LOW (ref >60)
Glucose, Bld: 105 mg/dL — ABNORMAL HIGH (ref 70–99)
Glucose, Bld: 98 mg/dL (ref 70–99)
Potassium: 3.3 mmol/L — ABNORMAL LOW (ref 3.5–5.1)
Potassium: 3.4 mmol/L — ABNORMAL LOW (ref 3.5–5.1)
Sodium: 137 mmol/L (ref 135–145)
Sodium: 136 mmol/L (ref 135–145)

## 2019-04-21 LAB — MRSA PCR SCREENING: MRSA by PCR: NEGATIVE

## 2019-04-21 LAB — TSH: TSH: 1.167 u[IU]/mL (ref 0.350–4.500)

## 2019-04-21 LAB — GLUCOSE, CAPILLARY
Glucose-Capillary: 85 mg/dL (ref 70–99)
Glucose-Capillary: 84 mg/dL (ref 70–99)
Glucose-Capillary: 80 mg/dL (ref 70–99)
Glucose-Capillary: 101 mg/dL — ABNORMAL HIGH (ref 70–99)

## 2019-04-21 LAB — MAGNESIUM: Magnesium: 1.9 mg/dL (ref 1.7–2.4)

## 2019-04-21 LAB — NOVEL CORONAVIRUS, NAA (HOSP ORDER, SEND-OUT TO REF LAB; TAT 18-24 HRS): SARS-CoV-2, NAA: NOT DETECTED

## 2019-04-21 MED ORDER — SODIUM CHLORIDE 0.9 % IV SOLN
INTRAVENOUS | Status: AC
  Administered 2019-04-21: 16:00:00 via INTRAVENOUS

## 2019-04-21 MED ORDER — HEPARIN SODIUM (PORCINE) 5000 UNIT/ML IJ SOLN
5000.0000 [IU] | Freq: Three times a day (TID) | INTRAMUSCULAR | Status: DC
  Administered 2019-04-22 – 2019-04-23 (×3): 5000 [IU] via SUBCUTANEOUS
  Filled 2019-04-21 (×3): qty 1

## 2019-04-21 MED ORDER — METOPROLOL TARTRATE 5 MG/5ML IV SOLN
5.0000 mg | INTRAVENOUS | Status: DC | PRN
  Administered 2019-04-21: 16:00:00 5 mg via INTRAVENOUS
  Filled 2019-04-21 (×2): qty 5

## 2019-04-21 MED ORDER — CIPROFLOXACIN IN D5W 400 MG/200ML IV SOLN
400.0000 mg | INTRAVENOUS | Status: DC
  Administered 2019-04-21 – 2019-04-22 (×2): 400 mg via INTRAVENOUS
  Filled 2019-04-21 (×2): qty 200

## 2019-04-21 MED ORDER — METOPROLOL TARTRATE 5 MG/5ML IV SOLN
5.0000 mg | Freq: Once | INTRAVENOUS | Status: AC
  Administered 2019-04-21: 5 mg via INTRAVENOUS
  Filled 2019-04-21: qty 5

## 2019-04-21 MED ORDER — POTASSIUM CHLORIDE CRYS ER 20 MEQ PO TBCR
40.0000 meq | EXTENDED_RELEASE_TABLET | Freq: Once | ORAL | Status: AC
  Administered 2019-04-21: 40 meq via ORAL
  Filled 2019-04-21: qty 2

## 2019-04-21 NOTE — Progress Notes (Signed)
RT placed pt on CPAP dream station for the night on home setting of 8cmH2O with no O2 bled into the system. Pt respiratory status is stable at this time. RT will continue to monitor.

## 2019-04-21 NOTE — Progress Notes (Addendum)
PROGRESS NOTE    George Lewis.  XVQ:[id number] DOB: 06-Aug-1956 DOA: 04/20/2019 PCP: Esaw Grandchild, NP   Brief Narrative:  63 year old with history of right renal cell carcinoma status post nephrectomy September 2019, ESRD hemodialysis TTS, SVT, hyperlipidemia, essential hypertension, diabetes mellitus type 2, chronic diastolic CHF, obstructive sleep apnea, morbid obesity came to the hospital with complains of diarrhea followed by feeling of lightheadedness and dizziness.  Also had low-grade temperature at home, last session of dialysis 8/20.  In the ER he was noted to have borderline low blood pressure with an episode of nonsustained V. tach for which she received IV fluids and 1 g of calcium gluconate.   Assessment & Plan:   Principal Problem:   Diarrhea Active Problems:   OSA on CPAP   Diabetes mellitus type 2 in obese [facility])   Near syncope   NSVT (nonsustained ventricular tachycardia) (HCC)   ESRD on hemodialysis ([place])  Presyncope secondary to dehydration Nonbloody diarrhea -Suspect viral gastroenteritis.  Gentle hydration. Still orthostatic -We will check orthostatic this morning. - GI Panel= Pending. C diff= Negative.  COVID= negative.  -TSH normal in June 2020 -PT/OT -Echo June 2020-ejection fraction 60 to 65%  Nonsustained SVT. A fib rvr.  -This was a brief episode.  Was supposed to follow-up outpatient cardiology, Dr. Acie Fredrickson and get stress test but never followed up.  Their service has been notified. - Monitor electrolytes. Metoprolol prn if BP allows.  -Continue metoprolol as needed. Once his hydration status improved, I expect that to improves. If not, Will consult Cardiology.   ESRD on hemodialysis TTS -Nephro consulted.  Diabetes mellitus type 2, insulin-dependent -Hemoglobin A1c 7.4 on 6/20.  Insulin sliding scale Accu-Chek -Resume Lantus.  Hyperlipidemia -On statin  BPH - Continue Flomax  History of renal cell carcinoma status post nephrectomy  -Stable  Obstructive sleep apnea -CPAP  GERD -PPI  History of gout -Not in acute flare.  On allopurinol.  Update 545 pm Patient still have some NSVT, BMP, Mg, TSH, ekg ordered. Echo reviewed. Cardiology consult placed.  GI Panel + Shigella= ordered Cipro.   DVT prophylaxis: Lovenox Code Status: Full code Family Communication:  Spoke with [employer].  Disposition Plan: PersonChange to inpatient, patient is still lightlheaded, dehydrated, dizzy  Consultants:   Nephro- by admitting provider.   Procedures:   None  Antimicrobials:   None   Subjective: Patient is still having more than 5 episodes of watery diarrhea over last 12 hours.  Admits of feeling very dizzy when stood up.  But with hydration feeling slightly better.  Remains afebrile overnight.  Intermittently his heart rate is going in 120s and appears to be in atrial fibrillation with RVR but his blood pressure is somewhat soft and systolic of 09T.  Review of Systems Otherwise negative except as per HPI, including: General: Denies fever, chills, night sweats or unintended weight loss. Resp: Denies cough, wheezing, shortness of breath. Cardiac: Denies chest pain, palpitations, orthopnea, paroxysmal nocturnal dyspnea. GI: Denies abdominal pain, nausea, vomiting,constipation GU: Denies dysuria, frequency, hesitancy or incontinence MS: Denies muscle aches, joint pain or swelling Neuro: Denies headache, neurologic deficits (focal weakness, numbness, tingling), abnormal gait Psych: Denies anxiety, depression, SI/HI/AVH Skin: Denies new rashes or lesions ID: Denies sick contacts, exotic exposures, travel  Objective: Vitals:   04/20/19 2344 04/20/19 2347 04/21/19 0409 04/21/19 0645  BP: 92/63 90/75  (!) 88/51  Pulse: (!) 114 (!) 108  83  Resp: 18 20  16   Temp: 100 F (37.8 C)  98.9 F (37.2 C) 98.2 F (36.8 C) 98.4 F (36.9 C)  TempSrc: Oral Oral Oral Oral  SpO2: 98% 99%  97%    Intake/Output Summary (Last 24  hours) at 04/21/2019 0803 Last data filed at 04/21/2019 0500 Gross per 24 hour  Intake 500 ml  Output 50 ml  Net 450 ml   There were no vitals filed for this visit.  Examination:  General exam: Appears calm and comfortable  Respiratory system: Clear to auscultation. Respiratory effort normal. Dry mouth Cardiovascular system: S1 & S2 heard, RRR. No JVD, murmurs, rubs, gallops or clicks. No pedal edema. Gastrointestinal system: Abdomen is nondistended, soft and nontender. No organomegaly or masses felt. Normal bowel sounds heard. Central nervous system: Alert and oriented. No focal neurological deficits. Extremities: Symmetric 5 x 5 power. Skin: No rashes, lesions or ulcers Psychiatry: Judgement and insight appear normal. Mood & affect appropriate.     Data Reviewed:   CBC: Recent Labs  Lab 04/20/19 0755  WBC 12.9*  HGB 12.9*  HCT 40.6  MCV 85.8  PLT 824   Basic Metabolic Panel: Recent Labs  Lab 04/20/19 0755 04/20/19 1205  NA 136  --   K 4.2  --   CL 100  --   CO2 22  --   GLUCOSE 131*  --   BUN 42*  --   CREATININE 5.06*  --   CALCIUM 8.7*  --   MG  --  1.7   GFR: CrCl cannot be calculated (Unknown ideal weight.). Liver Function Tests: Recent Labs  Lab 04/20/19 0755  AST 17  ALT 12  ALKPHOS 113  BILITOT 0.9  PROT 7.2  ALBUMIN 3.8   Recent Labs  Lab 04/20/19 0755  LIPASE 31   No results for input(s): AMMONIA in the last 168 hours. Coagulation Profile: No results for input(s): INR, PROTIME in the last 168 hours. Cardiac Enzymes: No results for input(s): CKTOTAL, CKMB, CKMBINDEX, TROPONINI in the last 168 hours. BNP (last 3 results) No results for input(s): PROBNP in the last 8760 hours. HbA1C: No results for input(s): HGBA1C in the last 72 hours. CBG: Recent Labs  Lab 04/20/19 0713 04/20/19 1719 04/20/19 1903 04/20/19 2141  GLUCAP 124* 100* 108* 89   Lipid Profile: No results for input(s): CHOL, HDL, LDLCALC, TRIG, CHOLHDL, LDLDIRECT  in the last 72 hours. Thyroid Function Tests: No results for input(s): TSH, T4TOTAL, FREET4, T3FREE, THYROIDAB in the last 72 hours. Anemia Panel: No results for input(s): VITAMINB12, FOLATE, FERRITIN, TIBC, IRON, RETICCTPCT in the last 72 hours. Sepsis Labs: No results for input(s): PROCALCITON, LATICACIDVEN in the last 168 hours.  Recent Results (from the past 240 hour(s))  MRSA PCR Screening     Status: None   Collection Time: 04/20/19 11:52 PM   Specimen: Nasal Mucosa; Nasopharyngeal  Result Value Ref Range Status   MRSA by PCR NEGATIVE NEGATIVE Final    Comment:        The GeneXpert MRSA Assay (FDA approved for NASAL specimens only), is one component of a comprehensive MRSA colonization surveillance program. It is not intended to diagnose MRSA infection nor to guide or monitor treatment for MRSA infections. Performed at [facility], [place] [address], [place], [place] [address]          Radiology Studies: No results found.      Scheduled Meds: . allopurinol  100 mg Oral QPM  . aspirin EC  81 mg Oral Daily  . atorvastatin  40 mg Oral  QPM  . enoxaparin (LOVENOX) injection  40 mg Subcutaneous Q24H  . insulin aspart  0-9 Units Subcutaneous TID WC  . insulin glargine  28 Units Subcutaneous QHS  . pantoprazole  40 mg Oral Daily  . sodium chloride flush  3 mL Intravenous Q12H  . tamsulosin  0.4 mg Oral QPM   Continuous Infusions: . sodium chloride       LOS: 0 days   Time spent= 35 mins    Ankit Arsenio Loader, MD [facility]  If 7PM-7AM, please contact night-coverage [email or web address] 04/21/2019, 8:03 AM

## 2019-04-21 NOTE — Progress Notes (Addendum)
Pharmacy Antibiotic Note  George Lewis. is a 63 y.o. male admitted on 04/20/2019 with diarrhea.  Pharmacy has been consulted for Cipro dosing, stool + for Shigella  PMH: hx Renal cell Ca, Nephrectomy Sep 2019, ESRD on HD TTS, SVT, HPL, HTN, DM2, dCHF, OHS   Plan: Cipro 400mg  IV q24, given after HD sessions    Temp (24hrs), Avg:98.7 F (37.1 C), Min:98.2 F (36.8 C), Max:100 F (37.8 C)  Recent Labs  Lab 04/20/19 0755 04/21/19 0728  WBC 12.9* 7.7  CREATININE 5.06* 5.19*    CrCl cannot be calculated (Unknown ideal weight.).    Allergies  Allergen Reactions  . Penicillins Rash and Other (See Comments)    Has patient had a PCN reaction causing immediate rash, facial/tongue/throat swelling, SOB or lightheadedness with hypotension: yes Has patient had a PCN reaction causing severe rash involving mucus membranes or skin necrosis: no Has patient had a PCN reaction that required hospitalization: no Has patient had a PCN reaction occurring within the last 10 years: no If all of the above answers are "NO", then may proceed with Cephalosporin use.     Antimicrobials this admission: 8/23 Cipro >>   Microbiology results: 8/23 GI panel PCR: + Shigella 8/23 CDiff: neg/neg 8/22 MRSA PCR: neg  Thank you for allowing pharmacy to be a part of this patient's care.  Minda Ditto 04/21/2019 4:16 PM

## 2019-04-22 DIAGNOSIS — I480 Paroxysmal atrial fibrillation: Secondary | ICD-10-CM

## 2019-04-22 DIAGNOSIS — A09 Infectious gastroenteritis and colitis, unspecified: Secondary | ICD-10-CM

## 2019-04-22 DIAGNOSIS — B9623 Unspecified Shiga toxin-producing Escherichia coli [E. coli] (STEC) as the cause of diseases classified elsewhere: Secondary | ICD-10-CM

## 2019-04-22 LAB — COMPREHENSIVE METABOLIC PANEL
ALT: 12 U/L (ref 0–44)
AST: 14 U/L — ABNORMAL LOW (ref 15–41)
Albumin: 2.9 g/dL — ABNORMAL LOW (ref 3.5–5.0)
Alkaline Phosphatase: 87 U/L (ref 38–126)
Anion gap: 13 (ref 5–15)
BUN: 70 mg/dL — ABNORMAL HIGH (ref 6–20)
CO2: 18 mmol/L — ABNORMAL LOW (ref 22–32)
Calcium: 8.2 mg/dL — ABNORMAL LOW (ref 8.9–10.3)
Chloride: 105 mmol/L (ref 98–111)
Creatinine, Ser: 5.29 mg/dL — ABNORMAL HIGH (ref 0.61–1.24)
GFR calc Af Amer: 13 mL/min — ABNORMAL LOW (ref >60)
GFR calc non Af Amer: 11 mL/min — ABNORMAL LOW (ref >60)
Glucose, Bld: 93 mg/dL (ref 70–99)
Potassium: 3.7 mmol/L (ref 3.5–5.1)
Sodium: 136 mmol/L (ref 135–145)
Total Bilirubin: 0.7 mg/dL (ref 0.3–1.2)
Total Protein: 6.2 g/dL — ABNORMAL LOW (ref 6.5–8.1)

## 2019-04-22 LAB — PROTIME-INR
INR: 1.2 (ref 0.8–1.2)
Prothrombin Time: 14.6 seconds (ref 11.4–15.2)

## 2019-04-22 LAB — MAGNESIUM: Magnesium: 1.9 mg/dL (ref 1.7–2.4)

## 2019-04-22 LAB — CBC
HCT: 37.1 % — ABNORMAL LOW (ref 39.0–52.0)
Hemoglobin: 11.8 g/dL — ABNORMAL LOW (ref 13.0–17.0)
MCH: 26.9 pg (ref 26.0–34.0)
MCHC: 31.8 g/dL (ref 30.0–36.0)
MCV: 84.7 fL (ref 80.0–100.0)
Platelets: 205 10*3/uL (ref 150–400)
RBC: 4.38 MIL/uL (ref 4.22–5.81)
RDW: 15.6 % — ABNORMAL HIGH (ref 11.5–15.5)
WBC: 8.1 10*3/uL (ref 4.0–10.5)
nRBC: 0 % (ref 0.0–0.2)

## 2019-04-22 LAB — GLUCOSE, CAPILLARY
Glucose-Capillary: 77 mg/dL (ref 70–99)
Glucose-Capillary: 87 mg/dL (ref 70–99)
Glucose-Capillary: 94 mg/dL (ref 70–99)
Glucose-Capillary: 95 mg/dL (ref 70–99)

## 2019-04-22 MED ORDER — AMIODARONE HCL 200 MG PO TABS
400.0000 mg | ORAL_TABLET | Freq: Two times a day (BID) | ORAL | Status: DC
  Administered 2019-04-22 – 2019-04-23 (×3): 400 mg via ORAL
  Filled 2019-04-22 (×3): qty 2

## 2019-04-22 MED ORDER — CHLORHEXIDINE GLUCONATE CLOTH 2 % EX PADS
6.0000 | MEDICATED_PAD | Freq: Every day | CUTANEOUS | Status: DC
  Administered 2019-04-23: 06:00:00 6 via TOPICAL

## 2019-04-22 MED ORDER — SODIUM CHLORIDE 0.9 % IV BOLUS
500.0000 mL | Freq: Once | INTRAVENOUS | Status: AC
  Administered 2019-04-22: 500 mL via INTRAVENOUS

## 2019-04-22 MED ORDER — WARFARIN SODIUM 6 MG PO TABS
6.0000 mg | ORAL_TABLET | Freq: Once | ORAL | Status: AC
  Administered 2019-04-22: 6 mg via ORAL
  Filled 2019-04-22: qty 1

## 2019-04-22 MED ORDER — WARFARIN - PHARMACIST DOSING INPATIENT
Freq: Every day | Status: DC
  Administered 2019-04-22: 18:00:00

## 2019-04-22 NOTE — Progress Notes (Signed)
PROGRESS NOTE    George Lewis.  DUK:[id number] DOB: [birth year] DOA: 04/20/2019 PCP: Esaw Grandchild, NP   Brief Narrative:  63 year old with history of right renal cell carcinoma status post nephrectomy September 2019, ESRD hemodialysis TTS, SVT, hyperlipidemia, essential hypertension, diabetes mellitus type 2, chronic diastolic CHF, obstructive sleep apnea, morbid obesity came to the hospital with complains of diarrhea followed by feeling of lightheadedness and dizziness.  Also had low-grade temperature at home, last session of dialysis 8/20.  In the ER he was noted to have borderline low blood pressure with an episode of nonsustained V. tach for which she received IV fluids and 1 g of calcium gluconate.   Assessment & Plan:   Principal Problem:   Diarrhea Active Problems:   OSA on CPAP   Diabetes mellitus type 2 in obese [facility])   Near syncope   NSVT (nonsustained ventricular tachycardia) (HCC)   ESRD on hemodialysis ([place])  Presyncope secondary to dehydration Nonbloody diarrhea, shigellosis -Gentle hydration. Still orthostatic -We will check orthostatic this morning. - GI Panel= shows Shiga-like toxins. C diff= Negative.  COVID= negative.  -TSH normal in June 2020 -PT/OT -Echo June 2020-ejection fraction 60 to 65% -Cipro day 2  Nonsustained SVT.  Paroxysmal atrial fibrillation with intermittent RVR - Appreciate input from cardiology.  Coumadin started. - Amiodarone 400 mg twice daily, plans to switch it to 200 mg twice daily upon discharge per cardiology.  ESRD on hemodialysis TTS -Nephro consulted.  Diabetes mellitus type 2, insulin-dependent -Hemoglobin A1c 7.4 on 6/20.  Insulin sliding scale Accu-Chek -Resume Lantus.  Hyperlipidemia -On statin  BPH - Continue Flomax  History of renal cell carcinoma status post nephrectomy -Stable  Obstructive sleep apnea -CPAP  GERD -PPI  History of gout -Not in acute flare.  On allopurinol.  DVT prophylaxis:  Lovenox Code Status: Full code Family Communication:  Spoke with [employer].  Disposition Plan: Still having some diarrhea.  We will continue hospital stay for IV fluids and I am to her rate control  Consultants:   Nephrology  Cardiology  Procedures:   None  Antimicrobials:   None   Subjective: Still having watery diarrhea but tells me slightly improved than prior to admission.  Borderline low blood pressure this morning but says his dizziness is improving.  Still does not feel 100% back to himself but much better since admission  Review of Systems Otherwise negative except as per HPI, including: General = no fevers, chills, dizziness, malaise, fatigue HEENT/EYES = negative for pain, redness, loss of vision, double vision, blurred vision, loss of hearing, sore throat, hoarseness, dysphagia Cardiovascular= negative for chest pain, palpitation, murmurs, lower extremity swelling Respiratory/lungs= negative for shortness of breath, cough, hemoptysis, wheezing, mucus production Gastrointestinal= negative for nausea, vomiting, abdominal pain, melena, hematemesis Genitourinary= negative for Dysuria, Hematuria, Change in Urinary Frequency MSK = Negative for arthralgia, myalgias, Back Pain, Joint swelling  Neurology= Negative for headache, seizures, numbness, tingling  Psychiatry= Negative for anxiety, depression, suicidal and homocidal ideation Allergy/Immunology= Medication/Food allergy as listed  Skin= Negative for Rash, lesions, ulcers, itching   Objective: Vitals:   04/21/19 2328 04/22/19 0430 04/22/19 1236 04/22/19 1259  BP:  102/83 (!) 94/49 (!) 76/59  Pulse:  100 (!) 107 88  Resp:  16    Temp:  97.9 F (36.6 C) 98.3 F (36.8 C) 98.1 F (36.7 C)  TempSrc:  Oral Oral Oral  SpO2: 97% 97% 98% 98%    Intake/Output Summary (Last 24 hours) at 04/22/2019 1443 Last data  filed at 04/22/2019 0400 Gross per 24 hour  Intake 173.94 ml  Output -  Net 173.94 ml   There were no  vitals filed for this visit.  Examination:  Constitutional: NAD, calm, comfortable Eyes: PERRL, lids and conjunctivae normal ENMT: Mucous membranes are moist. Posterior pharynx clear of any exudate or lesions.Normal dentition.  Neck: normal, supple, no masses, no thyromegaly Respiratory: clear to auscultation bilaterally, no wheezing, no crackles. Normal respiratory effort. No accessory muscle use.  Cardiovascular: Regular rate and rhythm, no murmurs / rubs / gallops. No extremity edema. 2+ pedal pulses. No carotid bruits.  Abdomen: no tenderness, no masses palpated. No hepatosplenomegaly. Bowel sounds positive.  Musculoskeletal: no clubbing / cyanosis. No joint deformity upper and lower extremities. Good ROM, no contractures. Normal muscle tone.  Skin: no rashes, lesions, ulcers. No induration Neurologic: CN 2-12 grossly intact. Sensation intact, DTR normal. Strength 4/5 in all 4.  Psychiatric: Normal judgment and insight. Alert and oriented x 3. Normal mood. TDC right chest   Data Reviewed:   CBC: Recent Labs  Lab 04/20/19 0755 04/21/19 0728 04/22/19 0415  WBC 12.9* 7.7 8.1  HGB 12.9* 12.1* 11.8*  HCT 40.6 38.4* 37.1*  MCV 85.8 84.4 84.7  PLT 234 187 500   Basic Metabolic Panel: Recent Labs  Lab 04/20/19 0755 04/20/19 1205 04/21/19 0728 04/21/19 1823 04/22/19 0415  NA 136  --  137 136 136  K 4.2  --  3.3* 3.4* 3.7  CL 100  --  102 102 105  CO2 22  --  19* 19* 18*  GLUCOSE 131*  --  105* 98 93  BUN 42*  --  55* 61* 70*  CREATININE 5.06*  --  5.19* 5.13* 5.29*  CALCIUM 8.7*  --  8.3* 8.3* 8.2*  MG  --  1.7  --  1.9 1.9   GFR: CrCl cannot be calculated (Unknown ideal weight.). Liver Function Tests: Recent Labs  Lab 04/20/19 0755 04/22/19 0415  AST 17 14*  ALT 12 12  ALKPHOS 113 87  BILITOT 0.9 0.7  PROT 7.2 6.2*  ALBUMIN 3.8 2.9*   Recent Labs  Lab 04/20/19 0755  LIPASE 31   No results for input(s): AMMONIA in the last 168 hours. Coagulation  Profile: No results for input(s): INR, PROTIME in the last 168 hours. Cardiac Enzymes: No results for input(s): CKTOTAL, CKMB, CKMBINDEX, TROPONINI in the last 168 hours. BNP (last 3 results) No results for input(s): PROBNP in the last 8760 hours. HbA1C: No results for input(s): HGBA1C in the last 72 hours. CBG: Recent Labs  Lab 04/21/19 1256 04/21/19 1740 04/21/19 2225 04/22/19 0749 04/22/19 1207  GLUCAP 84 80 101* 77 87   Lipid Profile: No results for input(s): CHOL, HDL, LDLCALC, TRIG, CHOLHDL, LDLDIRECT in the last 72 hours. Thyroid Function Tests: Recent Labs    04/21/19 1823  TSH 1.167   Anemia Panel: No results for input(s): VITAMINB12, FOLATE, FERRITIN, TIBC, IRON, RETICCTPCT in the last 72 hours. Sepsis Labs: No results for input(s): PROCALCITON, LATICACIDVEN in the last 168 hours.  Recent Results (from the past 240 hour(s))  Novel Coronavirus, NAA (hospital order; send-out to ref lab)     Status: None   Collection Time: 04/20/19 10:56 AM   Specimen: Nasopharyngeal Swab; Respiratory  Result Value Ref Range Status   SARS-CoV-2, NAA NOT DETECTED NOT DETECTED Final    Comment: (NOTE) This test was developed and its performance characteristics determined by [employer]. This test has not been FDA cleared  or approved. This test has been authorized by FDA under an Emergency Use Authorization (EUA). This test is only authorized for the duration of time the declaration that circumstances exist justifying the authorization of the emergency use of in vitro diagnostic tests for detection of SARS-CoV-2 virus and/or diagnosis of COVID-19 infection under section 564(b)(1) of the Act, 21 U.S.C. 673ALP-3(X)(9), unless the authorization is terminated or revoked sooner. When diagnostic testing is negative, the possibility of a false negative result should be considered in the context of a patient's recent exposures and the presence of clinical signs and symptoms  consistent with COVID-19. An individual without symptoms of COVID-19 and who is not shedding SARS-CoV-2 virus would expect to have a negative (not detected) result in this assay. Performed  At: [facility] [address] [place], [place] [id number] Rush Farmer MD GD:[id number]    [place]  Final    Comment: Performed at [facility], [place] [address] [place], [place] [address]  Gastrointestinal Panel by PCR , Stool     Status: Abnormal   Collection Time: 04/20/19  8:30 PM   Specimen: Nasal Mucosa; Stool  Result Value Ref Range Status   Campylobacter species NOT DETECTED NOT DETECTED Final   Plesimonas shigelloides DETECTED (A) NOT DETECTED Final    Comment: RESULT CALLED TO, READ BACK BY AND VERIFIED WITH: TANISHA WASHINGTON AT 1550 ON 04/21/2019 [place].    Salmonella species NOT DETECTED NOT DETECTED Final   Yersinia enterocolitica NOT DETECTED NOT DETECTED Final   Vibrio species NOT DETECTED NOT DETECTED Final   Vibrio cholerae NOT DETECTED NOT DETECTED Final   Enteroaggregative E coli (EAEC) NOT DETECTED NOT DETECTED Final   Enterotoxigenic E coli (ETEC) NOT DETECTED NOT DETECTED Final   Shiga like toxin producing E coli (STEC) DETECTED (A) NOT DETECTED Final    Comment: RESULT CALLED TO, READ BACK BY AND VERIFIED WITH: TANISHA WASHINGTON AT 1550 ON 04/21/2019 [place].    E. coli O157 NOT DETECTED NOT DETECTED Final   Shigella/Enteroinvasive E coli (EIEC) DETECTED (A) NOT DETECTED Final    Comment: RESULT CALLED TO, READ BACK BY AND VERIFIED WITH: TANISHA WASHINGTON AT 1550 ON 04/21/2019 [place].    Cryptosporidium NOT DETECTED NOT DETECTED Final   Cyclospora cayetanensis NOT DETECTED NOT DETECTED Final   Entamoeba histolytica NOT DETECTED NOT DETECTED Final   Giardia lamblia NOT DETECTED NOT DETECTED Final   Adenovirus F40/41 NOT DETECTED NOT DETECTED Final   Astrovirus NOT DETECTED NOT DETECTED Final   Norovirus GI/GII NOT DETECTED NOT DETECTED  Final   Rotavirus A NOT DETECTED NOT DETECTED Final   Sapovirus (I, II, IV, and V) NOT DETECTED NOT DETECTED Final    Comment: Performed at [facility], [place] [place], [place] [address]  C difficile quick scan w PCR reflex     Status: None   Collection Time: 04/20/19  8:30 PM   Specimen: STOOL  Result Value Ref Range Status   C Diff antigen NEGATIVE NEGATIVE Final   C Diff toxin NEGATIVE NEGATIVE Final   C Diff interpretation No C. difficile detected.  Final    Comment: Performed at [facility], [place] [address], [place], [place] [address]  MRSA PCR Screening     Status: None   Collection Time: 04/20/19 11:52 PM   Specimen: Nasal Mucosa; Nasopharyngeal  Result Value Ref Range Status   MRSA by PCR NEGATIVE NEGATIVE Final    Comment:  The GeneXpert MRSA Assay (FDA approved for NASAL specimens only), is one component of a comprehensive MRSA colonization surveillance program. It is not intended to diagnose MRSA infection nor to guide or monitor treatment for MRSA infections. Performed at [facility], [place] [address] [place],  [address]          Radiology Studies: No results found.      Scheduled Meds: . allopurinol  100 mg Oral QPM  . amiodarone  400 mg Oral BID  . aspirin EC  81 mg Oral Daily  . atorvastatin  40 mg Oral QPM  . [START ON 04/23/2019] Chlorhexidine Gluconate Cloth  6 each Topical Q0600  . heparin injection (subcutaneous)  5,000 Units Subcutaneous Q8H  . insulin aspart  0-9 Units Subcutaneous TID WC  . insulin glargine  28 Units Subcutaneous QHS  . pantoprazole  40 mg Oral Daily  . sodium chloride flush  3 mL Intravenous Q12H  . tamsulosin  0.4 mg Oral QPM  . warfarin  6 mg Oral ONCE-1800  . Warfarin - Pharmacist Dosing Inpatient   Does not apply q1800   Continuous Infusions: . ciprofloxacin 200 mL/hr at 04/21/19 1841     LOS: 1 day   Time spent= 35 mins    Ankit Arsenio Loader, MD [facility]  If 7PM-7AM, please contact night-coverage [email or web address] 04/22/2019, 2:43 PM

## 2019-04-22 NOTE — Consult Note (Addendum)
Cardiology Consultation:   [name]. MRN: [id number]; DOB: 1956/06/05  Admit date: 04/20/2019 Date of Consult: 04/22/2019  Primary Care Provider: Esaw Grandchild, NP Primary Cardiologist: Elouise Munroe, MD  Primary Electrophysiologist:  None    Patient Profile:   George Lewis. is a 63 y.o. male with a hx of right renal cell carcinoma s/p nephrectomy in 9/19, ESRD on HD (TTS), HTN, HLD, SVT, DM type II, OSA on CPAP, and morbid obesity who is being seen today for the evaluation of possible atrial fibrillation at the request of Dr. Reesa Chew.  History of Present Illness:  Cardiac History includes hospital visit 05/2018 with Dr. Margaretann Loveless for syncope work-up following right nephrectomy. Symptoms were thought to be vasovagal at the time secondary to pain and dehydration. Echo 04/2018 LVEF 50-55%. Patient was noted to have 12-beat run NSVT and PVCS. Patient was asymptomatic. Patient did not need follow up outpatient.  Follow-up echo for NSVT was ordered that showed LVEF 60-65% with some diastolic dysfunction.   Patient was admitted 7/8 - 7/9 for chest pain thought to be more atypical. Enzymes were negative and EKG showed no changes. No further work-up was pursued. Plan was for outpatient follow-up for [facility] but patient says he never received a call.   Mr. Heinicke presented to the ER 8/22 for diarrhea that began the day before. He had at least 12 episodes of diarrhea throughout the night. He does not remember eating anything out of the ordinary. When patient would stand up he would feel lightheaded but has not passed out. Associated symptoms included mild shortness of breath. Denied any blood in the stool or vomiting. Denies chest pain. Patient also notes a 67 lb weight loss since starting HD 1 month ago. His last session was 8/20 and it required IV fluids for low BP. Upon admission BP 93/58, O2 was normal. Telemetry noted an episode of V tach. Labs showed WBC  12.9, Hgb 12.9, Potassium 4.2, BUN 42, Creatinine 5.06. EKG showed sinus tachycardia, 126 bpm with PVCs. Patient was given IV NS and 1g of calcium gluconate. Patient was admitted for further evaluation.   Yesterday it was noted patients heart rates were fast up to 200 and it was noted patient was in aifb RVR. Cardiology was consulted.   Heart Pathway Score:     Past Medical History:  Diagnosis Date   Arthritis    knees   Bilateral renal cysts 03/23/2018   Noted MRI ABD   Cholelithiasis 03/19/2018   noted on CT AB/Pelvis   CKD (chronic kidney disease), stage [facility])    nephrologist-- dr Hollie Salk [name]);  05-01-2018 has not started dialysis, scheduled for AV fistula creation 05-07-2018   Diverticulosis of colon 04/19/2018   Noted on CT abd/pelvis   GERD (gastroesophageal reflux disease)    Hepatic steatosis 03/19/2018   Mild diffuse, noted on CT AB/Pelvis   History of kidney stones    History of pulmonary embolus (PE) 03/2008   treated with coumadin for 6 months   History of sepsis 04/20/2018   per discharge note , probable UTI   Hypertension    Nocturia    OSA on CPAP    Pancreatic lesion    0.4cm cystic per CT 07/ 2019   Pneumonia    Pulmonary nodule    Solid 79m Right Lower Lobe   Right renal mass 04/10/2018   new dx--  scheduled for nephrectomy 05-16-2018   S/P ureteral stent placement:  04/16/2018 04/19/2018    Past Surgical History:  Procedure Laterality Date   AV FISTULA PLACEMENT Left 05/07/2018   Procedure: Creation of Left arm Radiocephalic Fistula;  Surgeon: Marty Heck, MD;  Location: [facility] OR;  Service: Vascular;  Laterality: Left;   CYSTOSCOPY/RETROGRADE/URETEROSCOPY/STONE EXTRACTION WITH BASKET  x2 last one 1990s approx.   CYSTOSCOPY/URETEROSCOPY/HOLMIUM LASER/STENT PLACEMENT Left 04/16/2018   Procedure: CYSTOSCOPY/LEFT URETEROSCOPY/LEFT RETROGRADE/STENT PLACEMENT;  Surgeon: Lucas Mallow, MD;  Location: [facility] ORS;  Service:  Urology;  Laterality: Left;   EUS N/A 05/03/2018   Procedure: UPPER ENDOSCOPIC ULTRASOUND (EUS) RADIAL;  Surgeon: Milus Banister, MD;  Location: [facility] ENDOSCOPY;  Service: Gastroenterology;  Laterality: N/A;   EUS N/A 05/03/2018   Procedure: UPPER ENDOSCOPIC ULTRASOUND (EUS) LINEAR;  Surgeon: Milus Banister, MD;  Location: [facility] ENDOSCOPY;  Service: Gastroenterology;  Laterality: N/A;   EXTRACORPOREAL SHOCK WAVE LITHOTRIPSY  x3  last one 2004 approx.   FINE NEEDLE ASPIRATION N/A 05/03/2018   Procedure: FINE NEEDLE ASPIRATION (FNA) LINEAR;  Surgeon: Milus Banister, MD;  Location: [facility] ENDOSCOPY;  Service: Gastroenterology;  Laterality: N/A;   LAPAROSCOPIC NEPHRECTOMY, HAND ASSISTED Right 05/16/2018   Procedure: HAND ASSISTED LAPAROSCOPIC RIGHT NEPHRECTOMY;  Surgeon: Lucas Mallow, MD;  Location: [facility] ORS;  Service: Urology;  Laterality: Right;   left index finger attachment  1980s   3-4 surgeries   TOE SURGERY  1980s   in beween 2nd and 3rd toes cyst removed     Home Medications:  Prior to Admission medications   Medication Sig Start Date End Date Taking? Authorizing Provider  acetaminophen (TYLENOL) 500 MG tablet Take 500 mg by mouth every 6 (six) hours as needed for mild pain or headache.   Yes [provider]  allopurinol (ZYLOPRIM) 100 MG tablet Take 1 tablet (100 mg total) by mouth every evening. 12/22/18  Yes Danford, Suann Larry, MD  aspirin EC 81 MG EC tablet Take 1 tablet (81 mg total) by mouth daily. 03/08/19  Yes Swayze, Ava, DO  atorvastatin (LIPITOR) 40 MG tablet Take 1 tablet (40 mg total) by mouth daily. Patient taking differently: Take 40 mg by mouth every evening.  03/08/19  Yes Swayze, Ava, DO  fluticasone (FLONASE) 50 MCG/ACT nasal spray Place 1 spray into both nostrils daily as needed for allergies or rhinitis.   Yes [provider]  insulin glargine (LANTUS) 100 UNIT/ML injection Inject 0.32 mLs (32 Units total) into the skin at bedtime. Patient taking  differently: Inject 28 Units into the skin at bedtime.  02/11/19  Yes Philemon Kingdom, MD  loratadine (CLARITIN) 10 MG tablet Take 10 mg by mouth every evening.    Yes [provider]  metoprolol tartrate (LOPRESSOR) 25 MG tablet Take 25 mg by mouth as needed (>130).    Yes [provider]  nitroGLYCERIN (NITROSTAT) 0.4 MG SL tablet Place 1 tablet (0.4 mg total) under the tongue every 5 (five) minutes as needed for chest pain. 03/07/19  Yes Swayze, Ava, DO  omeprazole (PRILOSEC OTC) 20 MG tablet Take 40 mg by mouth every morning.    Yes [provider]  tamsulosin (FLOMAX) 0.4 MG CAPS capsule Take 0.4 mg by mouth every evening.  03/08/18  Yes [provider]  blood glucose meter kit and supplies Dispense based on patient and insurance preference. Use up to four times daily as directed. (FOR ICD-10 E10.9, E11.9). 12/22/18   Danford, Suann Larry, MD  Continuous Blood Gluc Sensor (FREESTYLE LIBRE 14 DAY SENSOR) MISC  1 each by Does not apply route every 14 (fourteen) days. Change every 2 weeks 02/11/19   Philemon Kingdom, MD    Inpatient Medications: Scheduled Meds:  allopurinol  100 mg Oral QPM   aspirin EC  81 mg Oral Daily   atorvastatin  40 mg Oral QPM   heparin injection (subcutaneous)  5,000 Units Subcutaneous Q8H   insulin aspart  0-9 Units Subcutaneous TID WC   insulin glargine  28 Units Subcutaneous QHS   pantoprazole  40 mg Oral Daily   sodium chloride flush  3 mL Intravenous Q12H   tamsulosin  0.4 mg Oral QPM   Continuous Infusions:  sodium chloride Stopped (04/21/19 1822)   ciprofloxacin 200 mL/hr at 04/21/19 1841   PRN Meds: acetaminophen **OR** acetaminophen, albuterol, loperamide, metoprolol tartrate, ondansetron **OR** ondansetron (ZOFRAN) IV  Allergies:    Allergies  Allergen Reactions   Penicillins Rash and Other (See Comments)    Has patient had a PCN reaction causing immediate rash, facial/tongue/throat swelling, SOB or  lightheadedness with hypotension: yes Has patient had a PCN reaction causing severe rash involving mucus membranes or skin necrosis: no Has patient had a PCN reaction that required hospitalization: no Has patient had a PCN reaction occurring within the last 10 years: no If all of the above answers are "NO", then may proceed with Cephalosporin use.     Social History:   Social History   Socioeconomic History   Marital status: Married    Spouse name: Not on file   Number of children: Not on file   Years of education: Not on file   Highest education level: Not on file  Occupational History   Occupation: disabled  Social Designer, fashion/clothing strain: Patient refused   Food insecurity    Worry: Never true    Inability: Never true   Transportation needs    Medical: No    Non-medical: No  Tobacco Use   Smoking status: Never Smoker   Smokeless tobacco: Never Used  Substance and Sexual Activity   Alcohol use: Never    Frequency: Never   Drug use: Never   Sexual activity: Not on file  Lifestyle   Physical activity    Days per week: 0 days    Minutes per session: 0 min   Stress: Not on file  Relationships   Social connections    Talks on phone: More than three times a week    Gets together: Never    Attends religious service: More than 4 times per year    Active member of club or organization: No    Attends meetings of clubs or organizations: Never    Relationship status: Married   Intimate partner violence    Fear of current or ex partner: Not on file    Emotionally abused: Not on file    Physically abused: Not on file    Forced sexual activity: Not on file  Other Topics Concern   Not on file  Social History Narrative   Not on file    Family History:   Family History  Problem Relation Age of Onset   Alzheimer's disease Mother    Alzheimer's disease Father    Heart disease Father    Heart attack Father    Cancer - Other Sister       ROS:  Please see the history of present illness.  All other ROS reviewed and negative.     Physical Exam/Data:   Vitals:  04/21/19 1742 04/21/19 1957 04/21/19 2328 04/22/19 0430  BP: 95/65 102/65  102/83  Pulse: (!) 109 (!) 101  100  Resp: _0 Temp: 98.9 F (37.2 C) 98.7 F (37.1 C)  97.9 F (36.6 C)  TempSrc: Oral   Oral  SpO2: 96% 96% 97% 97%    Intake/Output Summary (Last 24 hours) at 04/22/2019 0934 Last data filed at 04/22/2019 0400 Gross per 24 hour  Intake 173.94 ml  Output --  Net 173.94 ml   Last 3 Weights 03/07/2019 03/06/2019 03/06/2019  Weight (lbs) 291 lb 14.2 oz 292 lb 12.3 oz 301 lb  Weight (kg) 132.4 kg 132.8 kg 136.533 kg     There is no height or weight on file to calculate BMI.  General:  Well nourished, well developed obese WM, in no acute distress HEENT: normal Lymph: no adenopathy Neck: no JVD Endocrine:  No thryomegaly Vascular: No carotid bruits; FA pulses 2+ bilaterally without bruits  Cardiac:  normal S1, S2; RRR; no murmur; tachycardic Lungs:  clear to auscultation bilaterally, no wheezing, rhonchi or rales  Abd: soft, nontender, no hepatomegaly  Ext: no edema Musculoskeletal:  No deformities, BUE and BLE strength normal and equal Skin: warm and dry  Neuro:  CNs 2-12 intact, no focal abnormalities noted Psych:  Normal affect   EKG:  The EKG was personally reviewed and demonstrates:  Sinus tachycardia 126 bpm, 1 PVC Telemetry:  Telemetry was personally reviewed and demonstrates:  Sinus tachycardia on admission.; 1 episode of 18 beat Vtach 8/23 17:16; occasional PVCs It appears patient converted to afib yesterday afternoon with some elevated rates to 140s, rates improved to 100-120s  Relevant CV Studies:  Echo 02/26/19  1. The left ventricle has normal systolic function with an ejection fraction of 60-65%. The cavity size was normal. Left ventricular diastolic Doppler parameters are consistent with impaired relaxation.  2. The right  ventricle has normal systolic function. The cavity was normal. There is no increase in right ventricular wall thickness.  3. Left atrial size was mildly dilated.  4. The aortic valve is tricuspid. Mild thickening of the aortic valve. Mild calcification of the aortic valve.  Laboratory Data:  High Sensitivity Troponin:  No results for input(s): TROPONINIHS in the last 720 hours.   Cardiac EnzymesNo results for input(s): TROPONINI in the last 168 hours. No results for input(s): TROPIPOC in the last 168 hours.  Chemistry Recent Labs  Lab 04/21/19 0728 04/21/19 1823 04/22/19 0415  NA 137 136 136  K 3.3* 3.4* 3.7  CL 102 102 105  CO2 19* 19* 18*  GLUCOSE 105* 98 93  BUN 55* 61* 70*  CREATININE 5.19* 5.13* 5.29*  CALCIUM 8.3* 8.3* 8.2*  GFRNONAA 11* 11* 11*  GFRAA 13* 13* 13*  ANIONGAP 16* 15 13    Recent Labs  Lab 04/20/19 0755 04/22/19 0415  PROT 7.2 6.2*  ALBUMIN 3.8 2.9*  AST 17 14*  ALT 12 12  ALKPHOS 113 87  BILITOT 0.9 0.7   Hematology Recent Labs  Lab 04/20/19 0755 04/21/19 0728 04/22/19 0415  WBC 12.9* 7.7 8.1  RBC 4.73 4.55 4.38  HGB 12.9* 12.1* 11.8*  HCT 40.6 38.4* 37.1*  MCV 85.8 84.4 84.7  MCH 27.3 26.6 26.9  MCHC 31.8 31.5 31.8  RDW 15.7* 15.9* 15.6*  PLT 234 187 205   BNPNo results for input(s): BNP, PROBNP in the last 168 hours.  DDimer No results for input(s): DDIMER in the last 168 hours.  Radiology/Studies:  No results found.  Assessment and Plan:   New onset Afib/NSVT Patient has no previous history of afib. He does have a histroy of asymptomatic NSVT on metoprolol. Patient was admitted 8/22 for diarrhea. Yesterday in the morning elevated heart rates were noted, peak rate 200, and it was thought patient had episode of afib RVR.  - After reviewing telemetry it appears patient is in afib - EKG this morning showed afib, 85 bpm - TSH 1.167 - Electrolytes wnl - Magnesium 1.9 - Patient denies chest pain or [place] echo 6/30 EF 60-65%  with mildly dilated atrium - BPs still soft,102/83 this morning - CHADSVASC = 3 ( HTN, DM, PAD). - Will start patient on coumadin per pharmacy - In the setting of hypotension will start amiodarone 477m BID load x 1 weeks with plan to decreased to 2084mBID.  Presyncope 2/2 dehydration and diarrhea Patient admitted for IVF for severe diarrhea - Diarrhea 2/2 gastroenteritis - GI panel pending - C.diff negative - Per IM  ESRD on HD TTS - Last HD was Thursday 8/20 - per nephrology  DM2 - per IM - A1C 7.4 in June  Hyperlipidemia - continue Atorvastatin  OSA on CPAP - compliant  For questions or updates, please contact [facility] Please consult [email or web address] for contact info under     Signed, Cadence H Ninfa MeekerPA-C  04/22/2019 9:34 AM   Personally seen and examined. Agree with above.   [age]ear old with end-stage renal disease on hemodialysis here with possible viral gastroenteritis, prior DVT history of right renal cell carcinoma nephrectomy in 2019 diabetes hypertension hyperlipidemia obstructive sleep apnea and morbid obesity.  We are seeing him because of paroxysmal atrial fibrillation.  Atrial fibrillation on telemetry has been present for approximately 24 hours.  Prior to this, he was showing some short runs of nonsustained ventricular tachycardia.  Most recent ECG personally reviewed does demonstrate atrial fibrillation.  Prior EF normal.  CT scan also personally reviewed shows mild right iliac atherosclerosis.  Blood pressure has been quite low, likely from gastroenteritis in conjunction with hemodialysis.  He has felt some dizziness at home recently.  Felt lightheaded.  GEN: Well nourished, well developed, in no acute distress, obese HEENT: normal  Neck: no JVD, carotid bruits, or masses Cardiac: Irregularly irregular; no murmurs, rubs, or gallops,no edema  Respiratory:  clear to auscultation bilaterally, normal work of breathing GI: soft, nontender, nondistended,  + BS MS: no deformity or atrophy  Skin: warm and dry, no rash Neuro:  Alert and Oriented x 3, Strength and sensation are intact Psych: euthymic mood, full affect  Assessment and plan  Paroxysmal atrial fibrillation/NSVT Near syncope, diarrhea End-stage renal disease on hemodialysis Diabetes with hypertension and hyperlipidemia Obstructive sleep apnea Hypotension  -Telemetry personally reviewed does demonstrate some evidence of atrial fibrillation.  Hopefully this is concomitant with his current metabolic derangement, infectious state and will improve once his metabolic state improves.  He does however feel occasional palpitations in the recent past.  -I think it makes sense for usKoreao go ahead and initiate Coumadin per pharmacy.  He has been on Coumadin in the past for his prior the venous thromboembolic disease and PEs.  -I will also give him amiodarone gentle load 400 twice a day with 200 twice a day on discharge.  Unable to utilize traditional agents such as diltiazem or metoprolol because of hypotension.  Has CHADSVASc is 3 for hypertension diabetes as well as peripheral arterial disease (iliac atherosclerosis noted)  Hopefully after a month or so if he continues to show maintenance of sinus rhythm, will be able to stop the amiodarone.  Candee Furbish, MD

## 2019-04-22 NOTE — Progress Notes (Signed)
ANTICOAGULATION CONSULT NOTE - Initial Consult  Pharmacy Consult for Coumadin Indication: atrial fibrillation  Allergies  Allergen Reactions  . Penicillins Rash and Other (See Comments)    Has patient had a PCN reaction causing immediate rash, facial/tongue/throat swelling, SOB or lightheadedness with hypotension: yes Has patient had a PCN reaction causing severe rash involving mucus membranes or skin necrosis: no Has patient had a PCN reaction that required hospitalization: no Has patient had a PCN reaction occurring within the last 10 years: no If all of the above answers are "NO", then may proceed with Cephalosporin use.     Patient Measurements:     Vital Signs: Temp: 98.1 F (36.7 C) (08/24 1259) Temp Source: Oral (08/24 1259) BP: 76/59 (08/24 1259) Pulse Rate: 88 (08/24 1259)  Labs: Recent Labs    04/20/19 0755 04/21/19 0728 04/21/19 1823 04/22/19 0415  HGB 12.9* 12.1*  --  11.8*  HCT 40.6 38.4*  --  37.1*  PLT 234 187  --  205  CREATININE 5.06* 5.19* 5.13* 5.29*    CrCl cannot be calculated (Unknown ideal weight.).   Medical History: Past Medical History:  Diagnosis Date  . Arthritis    knees  . Bilateral renal cysts 03/23/2018   Noted MRI ABD  . Cholelithiasis 03/19/2018   noted on CT AB/Pelvis  . CKD (chronic kidney disease), stage IV [facility])    nephrologist-- dr Hollie Salk [name]);  05-01-2018 has not started dialysis, scheduled for AV fistula creation 05-07-2018  . Diverticulosis of colon 04/19/2018   Noted on CT abd/pelvis  . GERD (gastroesophageal reflux disease)   . Hepatic steatosis 03/19/2018   Mild diffuse, noted on CT AB/Pelvis  . History of kidney stones   . History of pulmonary embolus (PE) 03/2008   treated with coumadin for 6 months  . History of sepsis 04/20/2018   per discharge note , probable UTI  . Hypertension   . Nocturia   . OSA on CPAP   . Pancreatic lesion    0.4cm cystic per CT 07/ 2019  . Pneumonia   . Pulmonary  nodule    Solid 25mm Right Lower Lobe  . Right renal mass 04/10/2018   new dx--  scheduled for nephrectomy 05-16-2018  . S/P ureteral stent placement: 04/16/2018 04/19/2018   Assessment: Anticoag: Lovenox 40mg  >> Hep SQ 8/24. Start warfarin 8/24 (new) for new afib. CHADSVASC = 3 ( HTN, DM, PAD).  Goal of Therapy:  INR 2-3 Monitor platelets by anticoagulation protocol: Yes   Plan:  Coumadin 6mg  po x 1 tonight Daily INR Monitor for rapid INR elevated due to severe drug-drug interaction with Amiodarone and Cipro.   [name] S. Alford Highland, PharmD, BCPS Clinical Staff Pharmacist Eilene Ghazi Stillinger 04/22/2019,1:55 PM

## 2019-04-22 NOTE — Consult Note (Addendum)
[name] Renal Consultation Note    Indication for Consultation:  Management of ESRD/hemodialysis, anemia, hypertension/volume, and secondary hyperparathyroidism. PCP:  HPI: George Lewis. is a 63 y.o. male with Hx ESRD (just started HD 02/2019), HTN, T2DM, Hx R nephrectomy (d/t RCCa), OSA, and obesity who was admitted with profuse diarrhea and low grade fever.   Presented to ED on 04/20/19 after profuse non-bloody diarrhea and low grade fever x 1 day, and worsening fatigue and presyncopal sensation. He was found to be hypotensive. He did have a run of VTach which spontaneously resolved, given IV Ca Gluc. Intake labs showed WBC 12.9, Hgb 12.9, K 4.2. He was given 271m saline and admitted. C-diff and COVID testing were both negative. GI panel collected and results last night with + Shigella and Shiga-toxin E.Coli - he was started on IV Cipro.  Had episode A-fib RVR yesterday -  cardiology consulted, started on amiodarone and warfarin.  There was a mix-up and renal was not consulted until today. Weekend labs remained stable except mild hypokalemia which was repleted. Seen in room - BP low today, but feeling a little better although has not been out of bed. Diarrhea slightly less. Was ordered another fluid bolus by his hospitalist which is appropriate. Denies sharp abd pain, CP, dyspnea. Afebrile today.  [facility]. He [facility] Saturday [facility] He uses TDC as his access - Hx prior failed L AVF.  Past Medical History:  Diagnosis Date  . Arthritis    knees  . Bilateral renal cysts 03/23/2018   Noted MRI ABD  . Cholelithiasis 03/19/2018   noted on CT AB/Pelvis  . CKD (chronic kidney disease), stage IV ([facility]    nephrologist-- dr uHollie Salk([facility]);  05-01-2018 has not started dialysis, scheduled for AV fistula creation 05-07-2018  . Diverticulosis of colon 04/19/2018   Noted on CT  abd/pelvis  . GERD (gastroesophageal reflux disease)   . Hepatic steatosis 03/19/2018   Mild diffuse, noted on CT AB/Pelvis  . History of kidney stones   . History of pulmonary embolus (PE) 03/2008   treated with coumadin for 6 months  . History of sepsis 04/20/2018   per discharge note , probable UTI  . Hypertension   . Nocturia   . OSA on CPAP   . Pancreatic lesion    0.4cm cystic per CT 07/ 2019  . Pneumonia   . Pulmonary nodule    Solid 280mRight Lower Lobe  . Right renal mass 04/10/2018   new dx--  scheduled for nephrectomy 05-16-2018  . S/P ureteral stent placement: 04/16/2018 04/19/2018   Past Surgical History:  Procedure Laterality Date  . AV FISTULA PLACEMENT Left 05/07/2018   Procedure: Creation of Left arm Radiocephalic Fistula;  Surgeon: ClMarty HeckMD;  [facility] Service[facility];  Laterality: Left;  . CYSTOSCOPY/RETROGRADE/URETEROSCOPY/STONE EXTRACTION WITH BASKET  x2 last one 1990s approx.  . CYSTOSCOPY/URETEROSCOPY/HOLMIUM LASER/STENT PLACEMENT Left 04/16/2018   Procedure: CYSTOSCOPY/LEFT URETEROSCOPY/LEFT RETROGRADE/STENT PLACEMENT;  Surgeon: BeLucas MallowMD;  Location: [facility];  Service: Urology;  Laterality: Left;  . EUS N/A 05/03/2018   Procedure: UPPER ENDOSCOPIC ULTRASOUND (EUS) RADIAL;  Surgeon: JaMilus BanisterMD;  Location: [facility];  Service: Gastroenterology;  Laterality: N/A;  . EUS N/A 05/03/2018   Procedure: UPPER ENDOSCOPIC ULTRASOUND (EUS) LINEAR;  Surgeon: JaMilus BanisterMD;  Location: [facility];  Service: Gastroenterology;  Laterality: N/A;  . EXTRACORPOREAL SHOCK WAVE LITHOTRIPSY  x3  last one 2004 approx.  Marland Kitchen FINE NEEDLE ASPIRATION N/A 05/03/2018   Procedure: FINE NEEDLE ASPIRATION (FNA) LINEAR;  Surgeon: Milus Banister, MD;  Location: [facility];  Service: Gastroenterology;  Laterality: N/A;  . LAPAROSCOPIC NEPHRECTOMY, HAND ASSISTED Right 05/16/2018   Procedure: HAND ASSISTED LAPAROSCOPIC RIGHT NEPHRECTOMY;   Surgeon: Lucas Mallow, MD;  Location: [facility];  Service: Urology;  Laterality: Right;  . left index finger attachment  1980s   3-4 surgeries  . TOE SURGERY  1980s   in beween 2nd and 3rd toes cyst removed   Family History  Problem Relation Age of Onset  . Alzheimer's disease Mother   . Alzheimer's disease Father   . Heart disease Father   . Heart attack Father   . Cancer - Other Sister    Social History:  reports that he has never smoked. He has never used smokeless tobacco. He reports that he does not drink alcohol or use drugs.  ROS: As per HPI otherwise negative.  Physical Exam: Vitals:   04/21/19 2328 04/22/19 0430 04/22/19 1236 04/22/19 1259  BP:  102/83 (!) 94/49 (!) 76/59  Pulse:  100 (!) 107 88  Resp:  16    Temp:  97.9 F (36.6 C) 98.3 F (36.8 C) 98.1 F (36.7 C)  TempSrc:  Oral Oral Oral  SpO2: 97% 97% 98% 98%     General: Well developed, well nourished, in no acute distress. On room air. Head: Normocephalic, atraumatic, sclera non-icteric, mucus membranes are moist. Neck: Supple without lymphadenopathy/masses. JVD not elevated. Lungs: Clear bilaterally to auscultation without wheezes, rales, or rhonchi. Breathing is unlabored. Heart: RRR with normal S1, S2. No murmurs, rubs, or gallops appreciated. Abdomen: Soft, non-distended. Mild generalized soreness, no sharp pains or guarding. Musculoskeletal:  Strength and tone appear normal for age. Lower extremities: No edema or ischemic changes, no open wounds. Neuro: Alert and oriented X 3. Moves all extremities spontaneously. Psych:  Responds to questions appropriately with a normal affect. Dialysis Access: TDC in R chest  Allergies  Allergen Reactions  . Penicillins Rash and Other (See Comments)    Has patient had a PCN reaction causing immediate rash, facial/tongue/throat swelling, SOB or lightheadedness with hypotension: yes Has patient had a PCN reaction causing severe rash involving mucus membranes or  skin necrosis: no Has patient had a PCN reaction that required hospitalization: no Has patient had a PCN reaction occurring within the last 10 years: no If all of the above answers are "NO", then may proceed with Cephalosporin use.    Prior to Admission medications   Medication Sig Start Date End Date Taking? Authorizing Provider  acetaminophen (TYLENOL) 500 MG tablet Take 500 mg by mouth every 6 (six) hours as needed for mild pain or headache.   Yes [provider]  allopurinol (ZYLOPRIM) 100 MG tablet Take 1 tablet (100 mg total) by mouth every evening. 12/22/18  Yes Danford, Suann Larry, MD  aspirin EC 81 MG EC tablet Take 1 tablet (81 mg total) by mouth daily. 03/08/19  Yes Swayze, Ava, DO  atorvastatin (LIPITOR) 40 MG tablet Take 1 tablet (40 mg total) by mouth daily. Patient taking differently: Take 40 mg by mouth every evening.  03/08/19  Yes Swayze, Ava, DO  fluticasone (FLONASE) 50 MCG/ACT nasal spray Place 1 spray into both nostrils daily as needed for allergies or rhinitis.   Yes [provider]  insulin glargine (LANTUS) 100  UNIT/ML injection Inject 0.32 mLs (32 Units total) into the skin at bedtime. Patient taking differently: Inject 28 Units into the skin at bedtime.  02/11/19  Yes Philemon Kingdom, MD  loratadine (CLARITIN) 10 MG tablet Take 10 mg by mouth every evening.    Yes [provider]  metoprolol tartrate (LOPRESSOR) 25 MG tablet Take 25 mg by mouth as needed (>130).    Yes [provider]  nitroGLYCERIN (NITROSTAT) 0.4 MG SL tablet Place 1 tablet (0.4 mg total) under the tongue every 5 (five) minutes as needed for chest pain. 03/07/19  Yes Swayze, Ava, DO  omeprazole (PRILOSEC OTC) 20 MG tablet Take 40 mg by mouth every morning.    Yes [provider]  tamsulosin (FLOMAX) 0.4 MG CAPS capsule Take 0.4 mg by mouth every evening.  03/08/18  Yes [provider]  blood glucose meter kit and supplies Dispense based on patient  and insurance preference. Use up to four times daily as directed. (FOR ICD-10 E10.9, E11.9). 12/22/18   Danford, Suann Larry, MD  Continuous Blood Gluc Sensor (FREESTYLE LIBRE 14 DAY SENSOR) MISC 1 each by Does not apply route every 14 (fourteen) days. Change every 2 weeks 02/11/19   Philemon Kingdom, MD   Current Facility-Administered Medications  Medication Dose Route Frequency Provider Last Rate Last Dose  . 0.9 %  sodium chloride infusion   Intravenous Continuous Damita Lack, MD   Stopped at 04/21/19 1822  . acetaminophen (TYLENOL) tablet 650 mg  650 mg Oral Q6H PRN Norval Morton, MD       Or  . acetaminophen (TYLENOL) suppository 650 mg  650 mg Rectal Q6H PRN Smith, Rondell A, MD      . albuterol (PROVENTIL) (2.5 MG/3ML) 0.083% nebulizer solution 2.5 mg  2.5 mg Nebulization Q6H PRN Smith, Rondell A, MD      . allopurinol (ZYLOPRIM) tablet 100 mg  100 mg Oral QPM Smith, Rondell A, MD   100 mg at 04/21/19 1823  . aspirin EC tablet 81 mg  81 mg Oral Daily Fuller Plan A, MD   81 mg at 04/22/19 0907  . atorvastatin (LIPITOR) tablet 40 mg  40 mg Oral QPM Smith, Rondell A, MD   40 mg at 04/21/19 1823  . ciprofloxacin (CIPRO) IVPB 400 mg  400 mg Intravenous Q24H Minda Ditto, RPH 200 mL/hr at 04/21/19 1841    . heparin injection 5,000 Units  5,000 Units Subcutaneous Q8H Amin, Ankit Chirag, MD      . insulin aspart (novoLOG) injection 0-9 Units  0-9 Units Subcutaneous TID WC Smith, Rondell A, MD      . insulin glargine (LANTUS) injection 28 Units  28 Units Subcutaneous QHS Fuller Plan A, MD   28 Units at 04/21/19 2048  . loperamide (IMODIUM) capsule 2 mg  2 mg Oral PRN Fuller Plan A, MD   2 mg at 04/21/19 2047  . metoprolol tartrate (LOPRESSOR) injection 5 mg  5 mg Intravenous Q4H PRN Amin, Ankit Chirag, MD   5 mg at 04/21/19 1559  . ondansetron (ZOFRAN) tablet 4 mg  4 mg Oral Q6H PRN Fuller Plan A, MD       Or  . ondansetron (ZOFRAN) injection 4 mg  4 mg Intravenous Q6H PRN  Fuller Plan A, MD   4 mg at 04/21/19 2046  . pantoprazole (PROTONIX) EC tablet 40 mg  40 mg Oral Daily Fuller Plan A, MD   40 mg at 04/22/19 0907  . sodium  chloride flush (NS) 0.9 % injection 3 mL  3 mL Intravenous Q12H Smith, Rondell A, MD   3 mL at 04/21/19 2200  . tamsulosin (FLOMAX) capsule 0.4 mg  0.4 mg Oral QPM Fuller Plan A, MD   0.4 mg at 04/21/19 1823   Labs: Basic Metabolic Panel: Recent Labs  Lab 04/21/19 0728 04/21/19 1823 04/22/19 0415  NA 137 136 136  K 3.3* 3.4* 3.7  CL 102 102 105  CO2 19* 19* 18*  GLUCOSE 105* 98 93  BUN 55* 61* 70*  CREATININE 5.19* 5.13* 5.29*  CALCIUM 8.3* 8.3* 8.2*   Liver Function Tests: Recent Labs  Lab 04/20/19 0755 04/22/19 0415  AST 17 14*  ALT 12 12  ALKPHOS 113 87  BILITOT 0.9 0.7  PROT 7.2 6.2*  ALBUMIN 3.8 2.9*   Recent Labs  Lab 04/20/19 0755  LIPASE 31   CBC: Recent Labs  Lab 04/20/19 0755 04/21/19 0728 04/22/19 0415  WBC 12.9* 7.7 8.1  HGB 12.9* 12.1* 11.8*  HCT 40.6 38.4* 37.1*  MCV 85.8 84.4 84.7  PLT 234 187 205   Dialysis Orders:  TTS at [facility] 4:15hr, 400/800, EDW 127kg, 3K/2.25Ca bath, TDC, heparin 4000 bolus - No ESA or VDRA  Assessment/Plan: 1.  Shigella + Shiga-toxin E.Coli diarrhea: On IV Cipro with improving symptoms. Afebrile. 2.  BP/volume: Hypotensive, no meds. Suspect volume depleted d/t #1 - for 564m NS bolus today. 3.  ESRD:  Usual TTS schedule. Missed last HD, but labs ok and volume down. No need for HD today, will resume usual schedule tomorrow. 4.  Anemia: Hgb > 11, no ESA for now. 5.  Metabolic bone disease: Ca ok, Phos pending. Follow. No VDRA. 6.  Type 2 DM: Insulin per primary. 7.  Paroxysmal A-fib: Cardiology consulted, started on amiodarone and warfarin.   KVeneta Penton PA-C 04/22/2019, 1:17 PM  CNewell RubbermaidPager: [phone]

## 2019-04-23 LAB — PROTIME-INR
INR: 1.2 (ref 0.8–1.2)
Prothrombin Time: 14.8 seconds (ref 11.4–15.2)

## 2019-04-23 LAB — CBC
HCT: 36.2 % — ABNORMAL LOW (ref 39.0–52.0)
HCT: 37.6 % — ABNORMAL LOW (ref 39.0–52.0)
Hemoglobin: 11.6 g/dL — ABNORMAL LOW (ref 13.0–17.0)
Hemoglobin: 12.5 g/dL — ABNORMAL LOW (ref 13.0–17.0)
MCH: 27.3 pg (ref 26.0–34.0)
MCH: 27.3 pg (ref 26.0–34.0)
MCHC: 32 g/dL (ref 30.0–36.0)
MCHC: 33.2 g/dL (ref 30.0–36.0)
MCV: 85.2 fL (ref 80.0–100.0)
MCV: 82.1 fL (ref 80.0–100.0)
Platelets: 204 10*3/uL (ref 150–400)
Platelets: 209 10*3/uL (ref 150–400)
RBC: 4.25 MIL/uL (ref 4.22–5.81)
RBC: 4.58 MIL/uL (ref 4.22–5.81)
RDW: 15.8 % — ABNORMAL HIGH (ref 11.5–15.5)
RDW: 15.3 % (ref 11.5–15.5)
WBC: 8.1 10*3/uL (ref 4.0–10.5)
WBC: 7.6 10*3/uL (ref 4.0–10.5)
nRBC: 0 % (ref 0.0–0.2)
nRBC: 0 % (ref 0.0–0.2)

## 2019-04-23 LAB — GLUCOSE, CAPILLARY: Glucose-Capillary: 71 mg/dL (ref 70–99)

## 2019-04-23 LAB — COMPREHENSIVE METABOLIC PANEL
ALT: 14 U/L (ref 0–44)
AST: 18 U/L (ref 15–41)
Albumin: 2.8 g/dL — ABNORMAL LOW (ref 3.5–5.0)
Alkaline Phosphatase: 83 U/L (ref 38–126)
Anion gap: 9 (ref 5–15)
BUN: 75 mg/dL — ABNORMAL HIGH (ref 6–20)
CO2: 17 mmol/L — ABNORMAL LOW (ref 22–32)
Calcium: 8.1 mg/dL — ABNORMAL LOW (ref 8.9–10.3)
Chloride: 109 mmol/L (ref 98–111)
Creatinine, Ser: 5.43 mg/dL — ABNORMAL HIGH (ref 0.61–1.24)
GFR calc Af Amer: 12 mL/min — ABNORMAL LOW (ref >60)
GFR calc non Af Amer: 11 mL/min — ABNORMAL LOW (ref >60)
Glucose, Bld: 104 mg/dL — ABNORMAL HIGH (ref 70–99)
Potassium: 3.8 mmol/L (ref 3.5–5.1)
Sodium: 135 mmol/L (ref 135–145)
Total Bilirubin: 0.4 mg/dL (ref 0.3–1.2)
Total Protein: 5.9 g/dL — ABNORMAL LOW (ref 6.5–8.1)

## 2019-04-23 LAB — MAGNESIUM: Magnesium: 1.9 mg/dL (ref 1.7–2.4)

## 2019-04-23 MED ORDER — CIPROFLOXACIN HCL 500 MG PO TABS: 500.0000 mg | ORAL_TABLET | Freq: Two times a day (BID) | ORAL | 0 refills | Status: AC

## 2019-04-23 MED ORDER — HEPARIN SODIUM (PORCINE) 1000 UNIT/ML IJ SOLN
INTRAMUSCULAR | Status: AC
  Administered 2019-04-23: 12:00:00 1000 [IU]
  Filled 2019-04-23: qty 4

## 2019-04-23 MED ORDER — HEPARIN SODIUM (PORCINE) 1000 UNIT/ML DIALYSIS
1000.0000 [IU] | INTRAMUSCULAR | Status: DC | PRN
  Administered 2019-04-23: 1000 [IU] via INTRAVENOUS_CENTRAL
  Filled 2019-04-23: qty 1

## 2019-04-23 MED ORDER — HEPARIN SODIUM (PORCINE) 1000 UNIT/ML IJ SOLN
INTRAMUSCULAR | Status: AC
  Filled 2019-04-23: qty 3

## 2019-04-23 MED ORDER — AMIODARONE HCL 400 MG PO TABS: ORAL_TABLET | ORAL | 0 refills | Status: DC

## 2019-04-23 MED ORDER — SODIUM CHLORIDE 0.9 % IV SOLN: 100.0000 mL | INTRAVENOUS | Status: DC | PRN

## 2019-04-23 MED ORDER — WARFARIN SODIUM 7.5 MG PO TABS: 7.5000 mg | ORAL_TABLET | Freq: Once | ORAL | Status: DC

## 2019-04-23 MED ORDER — WARFARIN SODIUM 7.5 MG PO TABS: 7.5000 mg | ORAL_TABLET | Freq: Every day | ORAL | 0 refills | Status: DC

## 2019-04-23 MED ORDER — ALTEPLASE 2 MG IJ SOLR: 2.0000 mg | Freq: Once | INTRAMUSCULAR | Status: DC | PRN

## 2019-04-23 MED ORDER — HEPARIN SODIUM (PORCINE) 1000 UNIT/ML DIALYSIS
20.0000 [IU]/kg | INTRAMUSCULAR | Status: DC | PRN
  Administered 2019-04-23: 3000 [IU] via INTRAVENOUS_CENTRAL
  Filled 2019-04-23: qty 2.5

## 2019-04-23 NOTE — Progress Notes (Signed)
   I have arranged for follow up in the afib clinic.  I called pt's PCP office, Mina Marble, NP, and she does does not manage warfarin therapy. I have arranged for initial coumadin clinic visit with our [facility] office on Friday. I discussed this with the patient and he is agreeable and appreciative for the arrangements.   Daune Perch, AGNP-C [facility] 04/23/2019  4:56 PM

## 2019-04-23 NOTE — Progress Notes (Signed)
Renal Navigator notified OP HD clinic/[facility] of plan for discharge today to provide continuity of care.  Alphonzo Cruise,  Renal Navigator [phone]

## 2019-04-23 NOTE — Progress Notes (Signed)
Progress Note  Patient Name: George Lewis. Date of Encounter: 04/23/2019  Primary Cardiologist: Elouise Munroe, MD   Subjective   Feeling a little bit better, no chest pain, no shortness of breath  Inpatient Medications    Scheduled Meds: . allopurinol  100 mg Oral QPM  . amiodarone  400 mg Oral BID  . aspirin EC  81 mg Oral Daily  . atorvastatin  40 mg Oral QPM  . Chlorhexidine Gluconate Cloth  6 each Topical Q0600  . heparin      . heparin injection (subcutaneous)  5,000 Units Subcutaneous Q8H  . insulin aspart  0-9 Units Subcutaneous TID WC  . insulin glargine  28 Units Subcutaneous QHS  . pantoprazole  40 mg Oral Daily  . sodium chloride flush  3 mL Intravenous Q12H  . tamsulosin  0.4 mg Oral QPM  . warfarin  7.5 mg Oral ONCE-1800  . Warfarin - Pharmacist Dosing Inpatient   Does not apply q1800   Continuous Infusions: . sodium chloride    . sodium chloride    . ciprofloxacin 200 mL/hr at 04/22/19 1852   PRN Meds: sodium chloride, sodium chloride, acetaminophen **OR** acetaminophen, albuterol, alteplase, heparin, heparin, loperamide, metoprolol tartrate, ondansetron **OR** ondansetron (ZOFRAN) IV   Vital Signs    Vitals:   04/23/19 0800 04/23/19 0830 04/23/19 0900 04/23/19 0930  BP: (!) 89/58 (!) 81/69 96/66 102/69  Pulse: 77 98 62 88  Resp:      Temp:      TempSrc:      SpO2:      Weight:      Height:        Intake/Output Summary (Last 24 hours) at 04/23/2019 0949 Last data filed at 04/23/2019 0300 Gross per 24 hour  Intake 341.8 ml  Output -  Net 341.8 ml   Last 3 Weights 04/23/2019 04/22/2019 03/07/2019  Weight (lbs) 274 lb 11.1 oz 274 lb 9.6 oz 291 lb 14.2 oz  Weight (kg) 124.6 kg 124.558 kg 132.4 kg      Telemetry    AFIB 80-90 - Personally Reviewed  ECG    AFIB - Personally Reviewed  Physical Exam   GEN: No acute distress.  obese Neck: No JVD Cardiac: irreg, no murmurs, rubs, or gallops.  Respiratory: Clear to auscultation  bilaterally. GI: Soft, nontender, non-distended  MS: No edema; No deformity. Neuro:  Nonfocal  Psych: Normal affect   Labs    High Sensitivity Troponin:  No results for input(s): TROPONINIHS in the last 720 hours.    Chemistry Recent Labs  Lab 04/20/19 0755  04/21/19 1823 04/22/19 0415 04/23/19 0348  NA 136   < > 136 136 135  K 4.2   < > 3.4* 3.7 3.8  CL 100   < > 102 105 109  CO2 22   < > 19* 18* 17*  GLUCOSE 131*   < > 98 93 104*  BUN 42*   < > 61* 70* 75*  CREATININE 5.06*   < > 5.13* 5.29* 5.43*  CALCIUM 8.7*   < > 8.3* 8.2* 8.1*  PROT 7.2  --   --  6.2* 5.9*  ALBUMIN 3.8  --   --  2.9* 2.8*  AST 17  --   --  14* 18  ALT 12  --   --  12 14  ALKPHOS 113  --   --  87 83  BILITOT 0.9  --   --  0.7 0.4  GFRNONAA 11*   < > 11* 11* 11*  GFRAA 13*   < > 13* 13* 12*  ANIONGAP 14   < > 15 13 9    < > = values in this interval not displayed.     Hematology Recent Labs  Lab 04/21/19 0728 04/22/19 0415 04/23/19 0348  WBC 7.7 8.1 8.1  RBC 4.55 4.38 4.25  HGB 12.1* 11.8* 11.6*  HCT 38.4* 37.1* 36.2*  MCV 84.4 84.7 85.2  MCH 26.6 26.9 27.3  MCHC 31.5 31.8 32.0  RDW 15.9* 15.6* 15.8*  PLT 187 205 204    BNPNo results for input(s): BNP, PROBNP in the last 168 hours.   DDimer No results for input(s): DDIMER in the last 168 hours.   Radiology    No results found.  Cardiac Studies   Normal EF  Patient Profile     63 y.o. male with paroxysmal atrial fibrillation in the setting of E. coli diarrhea with presyncope secondary to dehydration end-stage renal disease diabetes hypertension obstructive sleep apnea.  Assessment & Plan    Paroxysmal atrial fibrillation - Warfarin has been started.  Risks and benefits of this medication have been discussed at length including bleeding.  He has been on warfarin in the past so he is familiar with this and with his prior PE bilaterally.  I think this will be the best choice for him given his end-stage renal disease. - We also  are giving him a gentle amiodarone p.o. load with 400 mg twice daily.  When he is ready for discharge, decreased to 200 mg twice daily for 1 week and then 200 mg a day thereafter.  I would envision that this amiodarone will be short-term.  We will see him again in follow-up and determine whether or not we can discontinue the amiodarone.  Watch for any signs of nausea with this medication. -EF normal.  Dilated left atrium.  End-stage renal disease on hemodialysis - Tuesday Thursday Saturday nephrology on board.  Hypertension with diabetes hyperlipidemia - Hemoglobin A1c 7.4, atorvastatin  OSA on CPAP -Compliant.  Morbid obesity -BMI greater than 35 with 2 or more comorbidities.  Chronic anticoagulation -Warfarin as above.  CHADSVASc 3 for hypertension diabetes and peripheral arterial disease- right iliac atherosclerosis noted on CT scan. No bridge.   Will obtain follow up in AFIB clinic.  If PCP can't follow INR, we can help.   Discussed with Dr. Reesa Chew     For questions or updates, please contact [place] Please consult [email or web address] for contact info under        Signed, Candee Furbish, MD  04/23/2019, 9:49 AM

## 2019-04-23 NOTE — Procedures (Signed)
I was present at this dialysis session. I have reviewed the session itself and made appropriate changes.   Diarrhea has abated. On warfarin for pAF, INR 1.2 this AM.    3K bath. TDC no UF.  AP ok.  BP stable. Pt w/o complaint.   Filed Weights   04/22/19 2105 04/23/19 0738  Weight: 124.6 kg 124.6 kg    Recent Labs  Lab 04/23/19 0348  NA 135  K 3.8  CL 109  CO2 17*  GLUCOSE 104*  BUN 75*  CREATININE 5.43*  CALCIUM 8.1*    Recent Labs  Lab 04/21/19 0728 04/22/19 0415 04/23/19 0348  WBC 7.7 8.1 8.1  HGB 12.1* 11.8* 11.6*  HCT 38.4* 37.1* 36.2*  MCV 84.4 84.7 85.2  PLT 187 205 204    Scheduled Meds: . allopurinol  100 mg Oral QPM  . amiodarone  400 mg Oral BID  . aspirin EC  81 mg Oral Daily  . atorvastatin  40 mg Oral QPM  . Chlorhexidine Gluconate Cloth  6 each Topical Q0600  . heparin      . heparin injection (subcutaneous)  5,000 Units Subcutaneous Q8H  . insulin aspart  0-9 Units Subcutaneous TID WC  . insulin glargine  28 Units Subcutaneous QHS  . pantoprazole  40 mg Oral Daily  . sodium chloride flush  3 mL Intravenous Q12H  . tamsulosin  0.4 mg Oral QPM  . Warfarin - Pharmacist Dosing Inpatient   Does not apply q1800   Continuous Infusions: . sodium chloride    . sodium chloride    . ciprofloxacin 200 mL/hr at 04/22/19 1852   PRN Meds:.sodium chloride, sodium chloride, acetaminophen **OR** acetaminophen, albuterol, alteplase, heparin, heparin, loperamide, metoprolol tartrate, ondansetron **OR** ondansetron (ZOFRAN) IV   Pearson Grippe  MD 04/23/2019, 8:34 AM

## 2019-04-23 NOTE — Progress Notes (Signed)
ANTICOAGULATION CONSULT NOTE - f/u Consult  Pharmacy Consult for Coumadin Indication: atrial fibrillation  Allergies  Allergen Reactions  . Penicillins Rash and Other (See Comments)    Has patient had a PCN reaction causing immediate rash, facial/tongue/throat swelling, SOB or lightheadedness with hypotension: yes Has patient had a PCN reaction causing severe rash involving mucus membranes or skin necrosis: no Has patient had a PCN reaction that required hospitalization: no Has patient had a PCN reaction occurring within the last 10 years: no If all of the above answers are "NO", then may proceed with Cephalosporin use.     Patient Measurements: Height: 6' 0.99" (185.4 cm) Weight: 274 lb 11.1 oz (124.6 kg) IBW/kg (Calculated) : 79.88   Vital Signs: Temp: 97.6 F (36.4 C) (08/25 0738) Temp Source: Oral (08/25 0738) BP: 102/69 (08/25 0930) Pulse Rate: 88 (08/25 0930)  Labs: Recent Labs    04/21/19 0728 04/21/19 1823 04/22/19 0415 04/22/19 1411 04/23/19 0348  HGB 12.1*  --  11.8*  --  11.6*  HCT 38.4*  --  37.1*  --  36.2*  PLT 187  --  205  --  204  LABPROT  --   --   --  14.6 14.8  INR  --   --   --  1.2 1.2  CREATININE 5.19* 5.13* 5.29*  --  5.43*    Estimated Creatinine Clearance: 20 mL/min (A) (by C-G formula based on SCr of 5.43 mg/dL (H)).   Medical History: Past Medical History:  Diagnosis Date  . Arthritis    knees  . Bilateral renal cysts 03/23/2018   Noted MRI ABD  . Cholelithiasis 03/19/2018   noted on CT AB/Pelvis  . CKD (chronic kidney disease), stage IV [facility])    nephrologist-- dr Hollie Salk [name]);  05-01-2018 has not started dialysis, scheduled for AV fistula creation 05-07-2018  . Diverticulosis of colon 04/19/2018   Noted on CT abd/pelvis  . GERD (gastroesophageal reflux disease)   . Hepatic steatosis 03/19/2018   Mild diffuse, noted on CT AB/Pelvis  . History of kidney stones   . History of pulmonary embolus (PE) 03/2008   treated with coumadin for 6 months  . History of sepsis 04/20/2018   per discharge note , probable UTI  . Hypertension   . Nocturia   . OSA on CPAP   . Pancreatic lesion    0.4cm cystic per CT 07/ 2019  . Pneumonia   . Pulmonary nodule    Solid 37mm Right Lower Lobe  . Right renal mass 04/10/2018   new dx--  scheduled for nephrectomy 05-16-2018  . S/P ureteral stent placement: 04/16/2018 04/19/2018   Assessment: Anticoag: Lovenox 40mg  >> Hep SQ 8/24. Start warfarin 8/24 . (new) for new afib. CHADSVASC = 3 ( HTN, DM, PAD). INR 1.2. Hgb and plts stable.  Goal of Therapy:  INR 2-3 Monitor platelets by anticoagulation protocol: Yes   Plan:  Coumadin 7.5mg  po x 1 tonight Daily INR Monitor for rapid INR elevated due to severe drug-drug interaction with Amiodarone and Cipro.   Jonavon Trieu S. Alford Highland, PharmD, BCPS Clinical Staff Pharmacist Eilene Ghazi Stillinger 04/23/2019,9:44 AM

## 2019-04-23 NOTE — Discharge Summary (Signed)
Physician Discharge Summary  [name]. ZES:[id number] DOB: February 02, 1956 DOA: 04/20/2019  PCP: Esaw Grandchild, NP  Admit date: 04/20/2019 Discharge date: 04/23/2019  Admitted From: Home Disposition: Home  Recommendations for Outpatient Follow-up:  1. Follow up with PCP in 1-2 weeks 2. Please obtain BMP/CBC in one week your next doctors visit.  3. Take 1 day of oral Cipro 4. Follow-up outpatient atrial fibrillation clinic on Friday for INR check.  Further doses will be adjustment as necessary 5. Coumadin 7.5 mg to be taken daily in the evening.  No bridge per cardiology. 6. Amiodarone 200 mg twice daily for 7 days followed by 200 mg daily   Discharge Condition: Stable CODE STATUS: Full Diet recommendation: Renal  Brief/Interim Summary: 63 year old with history of right renal cell carcinoma status post nephrectomy September 2019, ESRD hemodialysis TTS, SVT, hyperlipidemia, essential hypertension, diabetes mellitus type 2, chronic diastolic CHF, obstructive sleep apnea, morbid obesity came to the hospital with complains of diarrhea followed by feeling of lightheadedness and dizziness.  Also had low-grade temperature at home, last session of dialysis 8/20.  In the ER he was noted to have borderline low blood pressure with an episode of nonsustained V. tach for which she received IV fluids and 1 g of calcium gluconate.  He was hydrated upon admission.  His C. difficile was negative but GI panel showed Shiga-like toxin therefore started on Cipro which improved his diarrhea significantly.  Upon discharge, he had 1 more day of Cipro left to be taken orally He also developed paroxysmal atrial fibrillation with intermittent RVR in the hospital therefore cardiology was consulted.  He was started on amiodarone 400 mg twice daily which was switched to 200 mg twice daily for 7 days followed by 200 mg daily.  Coumadin was started without bridge with cardiologist direction.  Advised to get outpatient  lab work for INR now coming Friday. Stable for discharge.  Tolerated his dialysis session on 04/23/2019 in the hospital.   Discharge Diagnoses:  Principal Problem:   Diarrhea Active Problems:   OSA on CPAP   Diabetes mellitus type 2 in obese [facility])   Near syncope   NSVT (nonsustained ventricular tachycardia) (HCC)   ESRD on hemodialysis ([place])  Presyncope secondary to dehydration Nonbloody diarrhea, shigellosis -Resolved with hydration -We will check orthostatic this morning. - GI Panel= shows Shiga-like toxins. C diff= Negative.  COVID= negative.  -TSH normal in June 2020 -Echocardiogram-ejection fraction 60 to 65% - Complete course of Cipro, 1 more day remaining  Nonsustained SVT.  Paroxysmal atrial fibrillation with intermittent RVR - Appreciate input from cardiology.  Coumadin started. -  Amiodarone 40 mg twice daily while in the hospital, will discharge on amiodarone 200 mg twice daily X 7 days followed by 200 mg daily. - No bridge.  Coumadin 7.5 mg in the evening.  INR check on upcoming Friday  ESRD on hemodialysis TTS -Status post dialysis today.  No fluid removed.  Follow-up outpatient at HD center  Diabetes mellitus type 2, insulin-dependent -Hemoglobin A1c 7.4 on 6/20.  Resume home meds  Hyperlipidemia -On statin  BPH - Continue Flomax  History of renal cell carcinoma status post nephrectomy -Stable  Obstructive sleep apnea -CPAP  GERD -PPI  History of gout -Not in acute flare.  On allopurinol.  Consultations:  Cardiology  Nephrology  Subjective: Feels okay.  Tells me he has not had diarrhea in over 24 hours.  Wishes to go home as he is feeling better.  Discharge Exam: Vitals:  04/23/19 1150 04/23/19 1217  BP: (!) 99/55 94/70  Pulse: 90 91  Resp: 16 20  Temp: 97.8 F (36.6 C) 98 F (36.7 C)  SpO2: 100% 97%   Vitals:   04/23/19 1100 04/23/19 1130 04/23/19 1150 04/23/19 1217  BP: 102/68 (!) 96/57 (!) 99/55 94/70  Pulse: 96 77  90 91  Resp:   16 20  Temp:   97.8 F (36.6 C) 98 F (36.7 C)  TempSrc:   Oral Oral  SpO2:   100% 97%  Weight:   124.6 kg   Height:        General: Pt is alert, awake, not in acute distress Cardiovascular: RRR, S1/S2 +, no rubs, no gallops Respiratory: CTA bilaterally, no wheezing, no rhonchi Abdominal: Soft, NT, ND, bowel sounds + Extremities: no edema, no cyanosis  Discharge Instructions   Allergies as of 04/23/2019      Reactions   Penicillins Rash, Other (See Comments)   Has patient had a PCN reaction causing immediate rash, facial/tongue/throat swelling, SOB or lightheadedness with hypotension: yes Has patient had a PCN reaction causing severe rash involving mucus membranes or skin necrosis: no Has patient had a PCN reaction that required hospitalization: no Has patient had a PCN reaction occurring within the last 10 years: no If all of the above answers are "NO", then may proceed with Cephalosporin use.      Medication List    TAKE these medications   acetaminophen 500 MG tablet Commonly known as: TYLENOL Take 500 mg by mouth every 6 (six) hours as needed for mild pain or headache.   allopurinol 100 MG tablet Commonly known as: ZYLOPRIM Take 1 tablet (100 mg total) by mouth every evening.   amiodarone 400 MG tablet Commonly known as: PACERONE Take 0.5 tablets (200 mg total) by mouth 2 (two) times daily for 7 days, THEN 0.5 tablets (200 mg total) 2 (two) times daily for 23 days. Start taking on: April 23, 2019   aspirin 81 MG EC tablet Take 1 tablet (81 mg total) by mouth daily.   atorvastatin 40 MG tablet Commonly known as: LIPITOR Take 1 tablet (40 mg total) by mouth daily. What changed: when to take this   blood glucose meter kit and supplies Dispense based on patient and insurance preference. Use up to four times daily as directed. (FOR ICD-10 E10.9, E11.9).   ciprofloxacin 500 MG tablet Commonly known as: Cipro Take 1 tablet (500 mg total) by mouth  2 (two) times daily for 1 day.   fluticasone 50 MCG/ACT nasal spray Commonly known as: FLONASE Place 1 spray into both nostrils daily as needed for allergies or rhinitis.   FreeStyle Libre 14 Day Sensor Misc 1 each by Does not apply route every 14 (fourteen) days. Change every 2 weeks   insulin glargine 100 UNIT/ML injection Commonly known as: LANTUS Inject 0.32 mLs (32 Units total) into the skin at bedtime. What changed: how much to take   loratadine 10 MG tablet Commonly known as: CLARITIN Take 10 mg by mouth every evening.   metoprolol tartrate 25 MG tablet Commonly known as: LOPRESSOR Take 25 mg by mouth as needed (>130).   nitroGLYCERIN 0.4 MG SL tablet Commonly known as: NITROSTAT Place 1 tablet (0.4 mg total) under the tongue every 5 (five) minutes as needed for chest pain.   omeprazole 20 MG tablet Commonly known as: PRILOSEC OTC Take 40 mg by mouth every morning.   tamsulosin 0.4 MG Caps capsule Commonly known as:  FLOMAX Take 0.4 mg by mouth every evening.   warfarin 7.5 MG tablet Commonly known as: COUMADIN Take 1 tablet (7.5 mg total) by mouth daily at 6 PM for 4 days.      Follow-up Information    [facility] Follow up.   Specialty: Cardiology Why: Cardiology hospital follow up for atrial fibrillation on Wednesday 05/01/2019 at 10:00. Please call the office for parking instructions and code to get into lot.  Contact information: [address] [phone] [place]       [facility] Follow up.   Specialty: Cardiology Why: Coumadin clinic initial visit on Friday at 2:30 at [facility] office.  Contact information: [address] [phone]       Esaw Grandchild, NP. Schedule an appointment as soon as possible for a visit in 2 week(s).   Specialty: Family Medicine Contact information: [place] [address] [phone]        Elouise Munroe, MD .   Specialties: Cardiology, Radiology Contact information: [address] [address] [place] [address] [phone]          Allergies  Allergen Reactions  . Penicillins Rash and Other (See Comments)    Has patient had a PCN reaction causing immediate rash, facial/tongue/throat swelling, SOB or lightheadedness with hypotension: yes Has patient had a PCN reaction causing severe rash involving mucus membranes or skin necrosis: no Has patient had a PCN reaction that required hospitalization: no Has patient had a PCN reaction occurring within the last 10 years: no If all of the above answers are "NO", then may proceed with Cephalosporin use.     You were cared for by a hospitalist during your hospital stay. If you have any questions about your discharge medications or the care you received while you were in the hospital after you are discharged, you can call the unit and asked to speak with the hospitalist on call if the hospitalist that took care of you is not available. Once you are discharged, your primary care physician will handle any further medical issues. Please note that no refills for any discharge medications will be authorized once you are discharged, as it is imperative that you return to your primary care physician (or establish a relationship with a primary care physician if you do not have one) for your aftercare needs so that they can reassess your need for medications and monitor your lab values.   Procedures/Studies:  No results found.   The results of significant diagnostics from this hospitalization (including imaging, microbiology, ancillary and laboratory) are listed below for reference.     Microbiology: Recent Results (from the past 240 hour(s))  Novel Coronavirus, NAA (hospital order; send-out to ref lab)     Status: None   Collection Time: 04/20/19 10:56 AM   Specimen:  Nasopharyngeal Swab; Respiratory  Result Value Ref Range Status   SARS-CoV-2, NAA NOT DETECTED NOT DETECTED Final    Comment: (NOTE) This test was developed and its performance characteristics determined by [employer]. This test has not been FDA cleared or approved. This test has been authorized by FDA under an Emergency Use Authorization (EUA). This test is only authorized for the duration of time the declaration that circumstances exist justifying the authorization of the emergency use of in vitro diagnostic tests for detection of SARS-CoV-2 virus and/or diagnosis of COVID-19 infection  under section 564(b)(1) of the Act, 21 U.S.C. 017CBS-4(H)(6), unless the authorization is terminated or revoked sooner. When diagnostic testing is negative, the possibility of a false negative result should be considered in the context of a patient's recent exposures and the presence of clinical signs and symptoms consistent with COVID-19. An individual without symptoms of COVID-19 and who is not shedding SARS-CoV-2 virus would expect to have a negative (not detected) result in this assay. Performed  At: [facility] [address] [place], [place] [id number] Rush Farmer MD KZ:[id number]    [place]  Final    Comment: Performed at [facility], [place] [address] [place], [place] [address]  Gastrointestinal Panel by PCR , Stool     Status: Abnormal   Collection Time: 04/20/19  8:30 PM   Specimen: Nasal Mucosa; Stool  Result Value Ref Range Status   Campylobacter species NOT DETECTED NOT DETECTED Final   Plesimonas shigelloides DETECTED (A) NOT DETECTED Final    Comment: RESULT CALLED TO, READ BACK BY AND VERIFIED WITH: TANISHA WASHINGTON AT 1550 ON 04/21/2019 [place].    Salmonella species NOT DETECTED NOT DETECTED Final   Yersinia enterocolitica NOT DETECTED NOT DETECTED Final   Vibrio species NOT DETECTED NOT DETECTED Final   Vibrio cholerae NOT  DETECTED NOT DETECTED Final   Enteroaggregative E coli (EAEC) NOT DETECTED NOT DETECTED Final   Enterotoxigenic E coli (ETEC) NOT DETECTED NOT DETECTED Final   Shiga like toxin producing E coli (STEC) DETECTED (A) NOT DETECTED Final    Comment: RESULT CALLED TO, READ BACK BY AND VERIFIED WITH: TANISHA WASHINGTON AT 1550 ON 04/21/2019 [place].    E. coli O157 NOT DETECTED NOT DETECTED Final   Shigella/Enteroinvasive E coli (EIEC) DETECTED (A) NOT DETECTED Final    Comment: RESULT CALLED TO, READ BACK BY AND VERIFIED WITH: TANISHA WASHINGTON AT 1550 ON 04/21/2019 [place].    Cryptosporidium NOT DETECTED NOT DETECTED Final   Cyclospora cayetanensis NOT DETECTED NOT DETECTED Final   Entamoeba histolytica NOT DETECTED NOT DETECTED Final   Giardia lamblia NOT DETECTED NOT DETECTED Final   Adenovirus F40/41 NOT DETECTED NOT DETECTED Final   Astrovirus NOT DETECTED NOT DETECTED Final   Norovirus GI/GII NOT DETECTED NOT DETECTED Final   Rotavirus A NOT DETECTED NOT DETECTED Final   Sapovirus (I, II, IV, and V) NOT DETECTED NOT DETECTED Final    Comment: Performed at [facility], [address] [place], [place] [address]  C difficile quick scan w PCR reflex     Status: None   Collection Time: 04/20/19  8:30 PM   Specimen: STOOL  Result Value Ref Range Status   C Diff antigen NEGATIVE NEGATIVE Final   C Diff toxin NEGATIVE NEGATIVE Final   C Diff interpretation No C. difficile detected.  Final    Comment: Performed at [facility], [place] [address], [place], [place] [address]  MRSA PCR Screening     Status: None   Collection Time: 04/20/19 11:52 PM   Specimen: Nasal Mucosa; Nasopharyngeal  Result Value Ref Range Status   MRSA by PCR NEGATIVE NEGATIVE Final    Comment:        The GeneXpert MRSA Assay (FDA approved for NASAL specimens only), is one component of a comprehensive MRSA colonization surveillance program. It is not intended to diagnose MRSA infection nor to guide  or monitor treatment for MRSA infections. Performed at [facility], [place] [address] [place], [place] [address]  Labs: BNP (last 3 results) Recent Labs    03/06/19 0555  BNP 32.9   Basic Metabolic Panel: Recent Labs  Lab 04/20/19 0755 04/20/19 1205 04/21/19 0728 04/21/19 1823 04/22/19 0415 04/23/19 0348  NA 136  --  137 136 136 135  K 4.2  --  3.3* 3.4* 3.7 3.8  CL 100  --  102 102 105 109  CO2 22  --  19* 19* 18* 17*  GLUCOSE 131*  --  105* 98 93 104*  BUN 42*  --  55* 61* 70* 75*  CREATININE 5.06*  --  5.19* 5.13* 5.29* 5.43*  CALCIUM 8.7*  --  8.3* 8.3* 8.2* 8.1*  MG  --  1.7  --  1.9 1.9 1.9   Liver Function Tests: Recent Labs  Lab 04/20/19 0755 04/22/19 0415 04/23/19 0348  AST 17 14* 18  ALT '12 12 14  ' ALKPHOS 113 87 83  BILITOT 0.9 0.7 0.4  PROT 7.2 6.2* 5.9*  ALBUMIN 3.8 2.9* 2.8*   Recent Labs  Lab 04/20/19 0755  LIPASE 31   No results for input(s): AMMONIA in the last 168 hours. CBC: Recent Labs  Lab 04/20/19 0755 04/21/19 0728 04/22/19 0415 04/23/19 0348 04/23/19 1330  WBC 12.9* 7.7 8.1 8.1 7.6  HGB 12.9* 12.1* 11.8* 11.6* 12.5*  HCT 40.6 38.4* 37.1* 36.2* 37.6*  MCV 85.8 84.4 84.7 85.2 82.1  PLT 234 187 205 204 209   Cardiac Enzymes: No results for input(s): CKTOTAL, CKMB, CKMBINDEX, TROPONINI in the last 168 hours. BNP: Invalid input(s): POCBNP CBG: Recent Labs  Lab 04/22/19 0749 04/22/19 1207 04/22/19 1714 04/22/19 2147 04/23/19 1216  GLUCAP 77 87 94 95 71   D-Dimer No results for input(s): DDIMER in the last 72 hours. Hgb A1c No results for input(s): HGBA1C in the last 72 hours. Lipid Profile No results for input(s): CHOL, HDL, LDLCALC, TRIG, CHOLHDL, LDLDIRECT in the last 72 hours. Thyroid function studies Recent Labs    04/21/19 1823  TSH 1.167   Anemia work up No results for input(s): VITAMINB12, FOLATE, FERRITIN, TIBC, IRON, RETICCTPCT in the last 72 hours. Urinalysis    Component Value  Date/Time   COLORURINE YELLOW 04/20/2019 2055   APPEARANCEUR HAZY (A) 04/20/2019 2055   LABSPEC 1.015 04/20/2019 2055   PHURINE 5.0 04/20/2019 2055   GLUCOSEU NEGATIVE 04/20/2019 2055   HGBUR NEGATIVE 04/20/2019 2055   BILIRUBINUR NEGATIVE 04/20/2019 2055   KETONESUR NEGATIVE 04/20/2019 2055   PROTEINUR 100 (A) 04/20/2019 2055   NITRITE NEGATIVE 04/20/2019 2055   LEUKOCYTESUR MODERATE (A) 04/20/2019 2055   Sepsis Labs Invalid input(s): PROCALCITONIN,  WBC,  LACTICIDVEN Microbiology Recent Results (from the past 240 hour(s))  Novel Coronavirus, NAA (hospital order; send-out to ref lab)     Status: None   Collection Time: 04/20/19 10:56 AM   Specimen: Nasopharyngeal Swab; Respiratory  Result Value Ref Range Status   SARS-CoV-2, NAA NOT DETECTED NOT DETECTED Final    Comment: (NOTE) This test was developed and its performance characteristics determined by [employer]. This test has not been FDA cleared or approved. This test has been authorized by FDA under an Emergency Use Authorization (EUA). This test is only authorized for the duration of time the declaration that circumstances exist justifying the authorization of the emergency use of in vitro diagnostic tests for detection of SARS-CoV-2 virus and/or diagnosis of COVID-19 infection under section 564(b)(1) of the Act, 21 U.S.C. 518ACZ-6(S)(0), unless the authorization is terminated or revoked sooner. When diagnostic testing  is negative, the possibility of a false negative result should be considered in the context of a patient's recent exposures and the presence of clinical signs and symptoms consistent with COVID-19. An individual without symptoms of COVID-19 and who is not shedding SARS-CoV-2 virus would expect to have a negative (not detected) result in this assay. Performed  At: [facility] [address] [place], [place] [id number] Rush Farmer MD OQ:[id number]    [place]   Final    Comment: Performed at [facility], [place] [address] [place], [place] [address]  Gastrointestinal Panel by PCR , Stool     Status: Abnormal   Collection Time: 04/20/19  8:30 PM   Specimen: Nasal Mucosa; Stool  Result Value Ref Range Status   Campylobacter species NOT DETECTED NOT DETECTED Final   Plesimonas shigelloides DETECTED (A) NOT DETECTED Final    Comment: RESULT CALLED TO, READ BACK BY AND VERIFIED WITH: TANISHA WASHINGTON AT 1550 ON 04/21/2019 [place].    Salmonella species NOT DETECTED NOT DETECTED Final   Yersinia enterocolitica NOT DETECTED NOT DETECTED Final   Vibrio species NOT DETECTED NOT DETECTED Final   Vibrio cholerae NOT DETECTED NOT DETECTED Final   Enteroaggregative E coli (EAEC) NOT DETECTED NOT DETECTED Final   Enterotoxigenic E coli (ETEC) NOT DETECTED NOT DETECTED Final   Shiga like toxin producing E coli (STEC) DETECTED (A) NOT DETECTED Final    Comment: RESULT CALLED TO, READ BACK BY AND VERIFIED WITH: TANISHA WASHINGTON AT 1550 ON 04/21/2019 [place].    E. coli O157 NOT DETECTED NOT DETECTED Final   Shigella/Enteroinvasive E coli (EIEC) DETECTED (A) NOT DETECTED Final    Comment: RESULT CALLED TO, READ BACK BY AND VERIFIED WITH: TANISHA WASHINGTON AT 1550 ON 04/21/2019 [place].    Cryptosporidium NOT DETECTED NOT DETECTED Final   Cyclospora cayetanensis NOT DETECTED NOT DETECTED Final   Entamoeba histolytica NOT DETECTED NOT DETECTED Final   Giardia lamblia NOT DETECTED NOT DETECTED Final   Adenovirus F40/41 NOT DETECTED NOT DETECTED Final   Astrovirus NOT DETECTED NOT DETECTED Final   Norovirus GI/GII NOT DETECTED NOT DETECTED Final   Rotavirus A NOT DETECTED NOT DETECTED Final   Sapovirus (I, II, IV, and V) NOT DETECTED NOT DETECTED Final    Comment: Performed at [facility], [address] [place], [place] [address]  C difficile quick scan w PCR reflex     Status: None   Collection Time: 04/20/19  8:30 PM   Specimen: STOOL  Result  Value Ref Range Status   C Diff antigen NEGATIVE NEGATIVE Final   C Diff toxin NEGATIVE NEGATIVE Final   C Diff interpretation No C. difficile detected.  Final    Comment: Performed at [facility], [place] [address], [place], [place] [address]  MRSA PCR Screening     Status: None   Collection Time: 04/20/19 11:52 PM   Specimen: Nasal Mucosa; Nasopharyngeal  Result Value Ref Range Status   MRSA by PCR NEGATIVE NEGATIVE Final    Comment:        The GeneXpert MRSA Assay (FDA approved for NASAL specimens only), is one component of a comprehensive MRSA colonization surveillance program. It is not intended to diagnose MRSA infection nor to guide or monitor treatment for MRSA infections. Performed at [facility], [place] [address] [place], [place] [address]      Time coordinating discharge:  I have spent 35 minutes face to face with the patient and  on the ward discussing the patients care, assessment, plan and disposition with other care givers. >50% of the time was devoted counseling the patient about the risks and benefits of treatment/Discharge disposition and coordinating care.   SIGNED:   Damita Lack, MD  [facility] 04/23/2019, 2:27 PM   If 7PM-7AM, please contact night-coverage [email or web address]

## 2019-04-23 NOTE — Progress Notes (Signed)
Nsg Discharge Note  Admit Date:  04/20/2019 Discharge date: 04/23/2019   Rudie Sermons. to be D/C'd Home per MD order.  AVS completed.  Copy for chart, and copy for patient signed, and dated. Patient/caregiver able to verbalize understanding.  Discharge Medication: Allergies as of 04/23/2019      Reactions   Penicillins Rash, Other (See Comments)   Has patient had a PCN reaction causing immediate rash, facial/tongue/throat swelling, SOB or lightheadedness with hypotension: yes Has patient had a PCN reaction causing severe rash involving mucus membranes or skin necrosis: no Has patient had a PCN reaction that required hospitalization: no Has patient had a PCN reaction occurring within the last 10 years: no If all of the above answers are "NO", then may proceed with Cephalosporin use.      Medication List    TAKE these medications   acetaminophen 500 MG tablet Commonly known as: TYLENOL Take 500 mg by mouth every 6 (six) hours as needed for mild pain or headache.   allopurinol 100 MG tablet Commonly known as: ZYLOPRIM Take 1 tablet (100 mg total) by mouth every evening.   amiodarone 400 MG tablet Commonly known as: PACERONE Take 0.5 tablets (200 mg total) by mouth 2 (two) times daily for 7 days, THEN 0.5 tablets (200 mg total) 2 (two) times daily for 23 days. Start taking on: April 23, 2019   aspirin 81 MG EC tablet Take 1 tablet (81 mg total) by mouth daily.   atorvastatin 40 MG tablet Commonly known as: LIPITOR Take 1 tablet (40 mg total) by mouth daily. What changed: when to take this   blood glucose meter kit and supplies Dispense based on patient and insurance preference. Use up to four times daily as directed. (FOR ICD-10 E10.9, E11.9).   ciprofloxacin 500 MG tablet Commonly known as: Cipro Take 1 tablet (500 mg total) by mouth 2 (two) times daily for 1 day.   fluticasone 50 MCG/ACT nasal spray Commonly known as: FLONASE Place 1 spray into both nostrils  daily as needed for allergies or rhinitis.   FreeStyle Libre 14 Day Sensor Misc 1 each by Does not apply route every 14 (fourteen) days. Change every 2 weeks   insulin glargine 100 UNIT/ML injection Commonly known as: LANTUS Inject 0.32 mLs (32 Units total) into the skin at bedtime. What changed: how much to take   loratadine 10 MG tablet Commonly known as: CLARITIN Take 10 mg by mouth every evening.   metoprolol tartrate 25 MG tablet Commonly known as: LOPRESSOR Take 25 mg by mouth as needed (>130).   nitroGLYCERIN 0.4 MG SL tablet Commonly known as: NITROSTAT Place 1 tablet (0.4 mg total) under the tongue every 5 (five) minutes as needed for chest pain.   omeprazole 20 MG tablet Commonly known as: PRILOSEC OTC Take 40 mg by mouth every morning.   tamsulosin 0.4 MG Caps capsule Commonly known as: FLOMAX Take 0.4 mg by mouth every evening.   warfarin 7.5 MG tablet Commonly known as: COUMADIN Take 1 tablet (7.5 mg total) by mouth daily at 6 PM for 4 days.       Discharge Assessment: Vitals:   04/23/19 1150 04/23/19 1217  BP: (!) 99/55 94/70  Pulse: 90 91  Resp: 16 20  Temp: 97.8 F (36.6 C) 98 F (36.7 C)  SpO2: 100% 97%   Skin clean, dry and intact without evidence of skin break down, no evidence of skin tears noted. IV catheter discontinued intact. Site without  signs and symptoms of complications - no redness or edema noted at insertion site, patient denies c/o pain - only slight tenderness at site.  Dressing with slight pressure applied.  D/c Instructions-Education: Discharge instructions given to patient/family with verbalized understanding. D/c education completed with patient/family including follow up instructions, medication list, d/c activities limitations if indicated, with other d/c instructions as indicated by MD - patient able to verbalize understanding, all questions fully answered. Patient instructed to return to ED, call 911, or call MD for any  changes in condition.  Patient escorted via [place], and D/C home via private auto.  Erasmo Leventhal, RN 04/23/2019 5:16 PM

## 2019-04-25 ENCOUNTER — Other Ambulatory Visit: Payer: Self-pay | Admitting: Cardiology

## 2019-04-26 ENCOUNTER — Ambulatory Visit (INDEPENDENT_AMBULATORY_CARE_PROVIDER_SITE_OTHER): Payer: PRIVATE HEALTH INSURANCE

## 2019-04-26 ENCOUNTER — Encounter: Payer: Self-pay | Admitting: *Deleted

## 2019-04-26 ENCOUNTER — Other Ambulatory Visit: Payer: Self-pay

## 2019-04-26 DIAGNOSIS — I4891 Unspecified atrial fibrillation: Secondary | ICD-10-CM | POA: Insufficient documentation

## 2019-04-26 DIAGNOSIS — Z7901 Long term (current) use of anticoagulants: Secondary | ICD-10-CM | POA: Diagnosis not present

## 2019-04-26 DIAGNOSIS — R918 Other nonspecific abnormal finding of lung field: Secondary | ICD-10-CM

## 2019-04-26 LAB — POCT INR: INR: 3.3 — AB (ref 2.0–3.0)

## 2019-04-26 MED ORDER — WARFARIN SODIUM 3 MG PO TABS: ORAL_TABLET | ORAL | 1 refills | Status: DC

## 2019-04-26 NOTE — Patient Instructions (Addendum)
Description   Skip today's dosage of Warfarin, then start taking 3mg  (1 tablet) daily.  Recheck on Tuesday in [facility].   A full discussion of the nature of anticoagulants has been carried out.  A benefit risk analysis has been presented to the patient, so that they understand the justification for choosing anticoagulation at this time. The need for frequent and regular monitoring, precise dosage adjustment and compliance is stressed.  Side effects of potential bleeding are discussed.  The patient should avoid any OTC items containing aspirin or ibuprofen, and should avoid great swings in general diet.  Avoid alcohol consumption.  Call if any signs of abnormal bleeding.

## 2019-04-26 NOTE — Progress Notes (Signed)
Oncology Nurse Navigator Documentation  Oncology Nurse Navigator Flowsheets 04/26/2019  Navigator Location [facility]  Referral Date to RadOnc/MedOnc 04/26/2019  Navigator Encounter Type Other/I received referral today.  I updated Dr. Julien Nordmann.  He would like patient to be seen with pulmonary first.  I updated Dr. Valeta Harms on referral.   Treatment Phase Abnormal Scans  Barriers/Navigation Needs Coordination of Care  Interventions Coordination of Care  Coordination of Care Other  Acuity Level 2  Time Spent with Patient 30

## 2019-04-29 ENCOUNTER — Telehealth: Payer: Self-pay

## 2019-04-29 ENCOUNTER — Other Ambulatory Visit: Payer: Self-pay

## 2019-04-29 DIAGNOSIS — R55 Syncope and collapse: Secondary | ICD-10-CM

## 2019-04-29 DIAGNOSIS — Z992 Dependence on renal dialysis: Secondary | ICD-10-CM

## 2019-04-29 DIAGNOSIS — N186 End stage renal disease: Secondary | ICD-10-CM

## 2019-04-29 DIAGNOSIS — I471 Supraventricular tachycardia, unspecified: Secondary | ICD-10-CM

## 2019-04-29 NOTE — Telephone Encounter (Signed)
-----   Message from Meryl Crutch, RN sent at 04/29/2019  3:21 PM EDT ----- Regarding: FW: order  ----- Message ----- From: Shirl Harris I Sent: 04/29/2019   9:33 AM EDT To: Meryl Crutch, RN Subject: FW: order                                       ----- Message ----- From: Shirl Harris I Sent: 04/25/2019   8:24 AM EDT To: Meryl Crutch, RN Subject: order                                            George Lewis  I got a staff message from Kathyrn Drown, NP who does not know how to put an outpatient myoview order in the system for this patient.  Can you put one in for me so I can schedule this?  Thanks [facility]

## 2019-04-30 ENCOUNTER — Encounter: Payer: Self-pay | Admitting: *Deleted

## 2019-04-30 ENCOUNTER — Other Ambulatory Visit: Payer: Self-pay

## 2019-04-30 ENCOUNTER — Telehealth: Payer: Self-pay

## 2019-04-30 ENCOUNTER — Ambulatory Visit (INDEPENDENT_AMBULATORY_CARE_PROVIDER_SITE_OTHER): Payer: PRIVATE HEALTH INSURANCE | Admitting: *Deleted

## 2019-04-30 DIAGNOSIS — Z7901 Long term (current) use of anticoagulants: Secondary | ICD-10-CM | POA: Diagnosis not present

## 2019-04-30 DIAGNOSIS — I4891 Unspecified atrial fibrillation: Secondary | ICD-10-CM | POA: Diagnosis not present

## 2019-04-30 LAB — POCT INR: INR: 2.7 (ref 2.0–3.0)

## 2019-04-30 NOTE — Progress Notes (Signed)
Oncology Nurse Navigator Documentation  Oncology Nurse Navigator Flowsheets 04/30/2019  Navigator Location [facility]  Referral Date to RadOnc/MedOnc -  Navigator Encounter Type Other/I received a message from pulmonary team that patient did not want to be seen at this time and will call back when ready.  I updated Dr. Gloriann Loan   Treatment Phase Abnormal Scans  Barriers/Navigation Needs Coordination of Care  Interventions Coordination of Care  Coordination of Care Other  Acuity Level 2  Time Spent with Patient 30

## 2019-04-30 NOTE — Patient Instructions (Addendum)
Description   Continue  taking 3mg  (1 tablet) daily.  Recheck one week. Call [facility] with new meds, questions or concerns [phone].

## 2019-04-30 NOTE — Telephone Encounter (Signed)
-----   Message from Garner Nash, DO sent at 04/26/2019  7:16 PM EDT ----- Regarding: New Patient George Lewis,  Please schedule in new patient appointment lung nodule slot. I should have appts available next week Thanks Leory Plowman

## 2019-04-30 NOTE — Telephone Encounter (Signed)
Call made to patient, he states he will call us when he can make the appointment. He reports he has several appointments and right now he is not able to give me a date or time. Nothing further needed at this time.

## 2019-05-01 ENCOUNTER — Other Ambulatory Visit: Payer: Self-pay | Admitting: *Deleted

## 2019-05-01 ENCOUNTER — Encounter (HOSPITAL_COMMUNITY): Payer: Self-pay | Admitting: Physician Assistant

## 2019-05-01 ENCOUNTER — Ambulatory Visit (INDEPENDENT_AMBULATORY_CARE_PROVIDER_SITE_OTHER): Payer: PRIVATE HEALTH INSURANCE | Admitting: Physician Assistant

## 2019-05-01 ENCOUNTER — Encounter: Payer: Self-pay | Admitting: Family

## 2019-05-01 ENCOUNTER — Encounter: Payer: Self-pay | Admitting: *Deleted

## 2019-05-01 ENCOUNTER — Telehealth: Payer: Self-pay | Admitting: Internal Medicine

## 2019-05-01 ENCOUNTER — Other Ambulatory Visit: Payer: Self-pay

## 2019-05-01 ENCOUNTER — Ambulatory Visit (HOSPITAL_COMMUNITY)
Admission: RE | Admit: 2019-05-01 | Discharge: 2019-05-01 | Disposition: A | Discharge status: Home / Self Care | Payer: PRIVATE HEALTH INSURANCE | Source: Ambulatory Visit | Attending: Family | Admitting: Family

## 2019-05-01 ENCOUNTER — Encounter: Payer: Self-pay | Admitting: Internal Medicine

## 2019-05-01 ENCOUNTER — Telehealth: Payer: Self-pay | Admitting: *Deleted

## 2019-05-01 ENCOUNTER — Ambulatory Visit (HOSPITAL_BASED_OUTPATIENT_CLINIC_OR_DEPARTMENT_OTHER)
Admit: 2019-05-01 | Discharge: 2019-05-01 | Disposition: A | Discharge status: Home / Self Care | Payer: PRIVATE HEALTH INSURANCE | Attending: Physician Assistant | Admitting: Physician Assistant

## 2019-05-01 VITALS — BP 114/72 | HR 81 | Ht 72.0 in | Wt 273.8 lb

## 2019-05-01 VITALS — BP 104/69 | HR 74 | Temp 97.9°F | Resp 16 | Ht 73.0 in | Wt 273.9 lb

## 2019-05-01 DIAGNOSIS — Z992 Dependence on renal dialysis: Secondary | ICD-10-CM

## 2019-05-01 DIAGNOSIS — N186 End stage renal disease: Secondary | ICD-10-CM | POA: Diagnosis present

## 2019-05-01 DIAGNOSIS — I480 Paroxysmal atrial fibrillation: Secondary | ICD-10-CM | POA: Insufficient documentation

## 2019-05-01 NOTE — Progress Notes (Signed)
Primary Care Physician: Esaw Grandchild, NP Primary Cardiologist: Dr Margaretann Loveless  Primary Electrophysiologist: none Referring Physician: Dr Christy Sartorius. is a 63 y.o. male with a history of right renal cell carcinomas/pnephrectomy in 9/19, ESRD on HD(TTS), HTN,HLD,SVT, DM type II, OSA on CPAP,morbid obesity, and paroxysmal atrial fibrillation who presents for consultation in the [facility].  The patient was initially diagnosed with atrial fibrillation 04/22/19 while he was hospitalized for diarrhea/food poisoning. Telemetry initially showed afib with RVR with rates up to 200 bpm. His electrolytes were within normal limits. Echo 02/26/19 showed normal EF 60-65% with mild LAE. Patient was started on amiodarone and coumadin. He reports that he feels like he is slowly improving since his hospitalization. His energy is slowly returning. He denies any heart racing or palpitations. He does have OSA and is on CPAP.  Today, he denies symptoms of palpitations, chest pain, shortness of breath, orthopnea, PND, lower extremity edema, dizziness, presyncope, syncope, snoring, daytime somnolence, bleeding, or neurologic sequela. The patient is tolerating medications without difficulties and is otherwise without complaint today. +fatigue   Atrial Fibrillation Risk Factors:  he does have symptoms or diagnosis of sleep apnea. he is compliant with CPAP therapy.   he has a BMI of Body mass index is 37.13 kg/m.Marland Kitchen Filed Weights   05/01/19 0955  Weight: 124.2 kg    Family History  Problem Relation Age of Onset  . Alzheimer's disease Mother   . Alzheimer's disease Father   . Heart disease Father   . Heart attack Father   . Cancer - Other Sister      Atrial Fibrillation Management history:  Previous antiarrhythmic drugs: amiodarone Previous cardioversions: none Previous ablations: none CHADS2VASC score: 3 Anticoagulation history: warfarin   Past  Medical History:  Diagnosis Date  . Arthritis    knees  . Bilateral renal cysts 03/23/2018   Noted MRI ABD  . Cholelithiasis 03/19/2018   noted on CT AB/Pelvis  . CKD (chronic kidney disease), stage IV [facility])    nephrologist-- dr Hollie Salk [name]);  05-01-2018 has not started dialysis, scheduled for AV fistula creation 05-07-2018  . Diverticulosis of colon 04/19/2018   Noted on CT abd/pelvis  . GERD (gastroesophageal reflux disease)   . Hepatic steatosis 03/19/2018   Mild diffuse, noted on CT AB/Pelvis  . History of kidney stones   . History of pulmonary embolus (PE) 03/2008   treated with coumadin for 6 months  . History of sepsis 04/20/2018   per discharge note , probable UTI  . Hypertension   . Nocturia   . OSA on CPAP   . Pancreatic lesion    0.4cm cystic per CT 07/ 2019  . Pneumonia   . Pulmonary nodule    Solid 28m Right Lower Lobe  . Right renal mass 04/10/2018   new dx--  scheduled for nephrectomy 05-16-2018  . S/P ureteral stent placement: 04/16/2018 04/19/2018   Past Surgical History:  Procedure Laterality Date  . AV FISTULA PLACEMENT Left 05/07/2018   Procedure: Creation of Left arm Radiocephalic Fistula;  Surgeon: CMarty Heck MD;  [facility]  Service: Vascular;  Laterality: Left;  . CYSTOSCOPY/RETROGRADE/URETEROSCOPY/STONE EXTRACTION WITH BASKET  x2 last one 1990s approx.  . CYSTOSCOPY/URETEROSCOPY/HOLMIUM LASER/STENT PLACEMENT Left 04/16/2018   Procedure: CYSTOSCOPY/LEFT URETEROSCOPY/LEFT RETROGRADE/STENT PLACEMENT;  Surgeon: BLucas Mallow MD;  Location: [facility];  Service: Urology;  Laterality: Left;  . EUS N/A 05/03/2018   Procedure: UPPER ENDOSCOPIC  ULTRASOUND (EUS) RADIAL;  Surgeon: Milus Banister, MD;  Location: [facility];  Service: Gastroenterology;  Laterality: N/A;  . EUS N/A 05/03/2018   Procedure: UPPER ENDOSCOPIC ULTRASOUND (EUS) LINEAR;  Surgeon: Milus Banister, MD;  Location: [facility];  Service: Gastroenterology;   Laterality: N/A;  . EXTRACORPOREAL SHOCK WAVE LITHOTRIPSY  x3  last one 2004 approx.  Marland Kitchen FINE NEEDLE ASPIRATION N/A 05/03/2018   Procedure: FINE NEEDLE ASPIRATION (FNA) LINEAR;  Surgeon: Milus Banister, MD;  Location: [facility];  Service: Gastroenterology;  Laterality: N/A;  . LAPAROSCOPIC NEPHRECTOMY, HAND ASSISTED Right 05/16/2018   Procedure: HAND ASSISTED LAPAROSCOPIC RIGHT NEPHRECTOMY;  Surgeon: Lucas Mallow, MD;  Location: [facility];  Service: Urology;  Laterality: Right;  . left index finger attachment  1980s   3-4 surgeries  . TOE SURGERY  1980s   in beween 2nd and 3rd toes cyst removed    Current Outpatient Medications  Medication Sig Dispense Refill  . acetaminophen (TYLENOL) 500 MG tablet Take 500 mg by mouth every 6 (six) hours as needed for mild pain or headache.    . allopurinol (ZYLOPRIM) 100 MG tablet Take 1 tablet (100 mg total) by mouth every evening. 30 tablet 0  . amiodarone (PACERONE) 400 MG tablet Take 0.5 tablets (200 mg total) by mouth 2 (two) times daily for 7 days, THEN 0.5 tablets (200 mg total) 2 (two) times daily for 23 days. 30 tablet 0  . aspirin EC 81 MG EC tablet Take 1 tablet (81 mg total) by mouth daily. 30 tablet 0  . atorvastatin (LIPITOR) 40 MG tablet Take 1 tablet (40 mg total) by mouth daily. (Patient taking differently: Take 40 mg by mouth every evening. ) 30 tablet 0  . fluticasone (FLONASE) 50 MCG/ACT nasal spray Place 1 spray into both nostrils daily as needed for allergies or rhinitis.    Marland Kitchen insulin glargine (LANTUS) 100 UNIT/ML injection Inject 0.32 mLs (32 Units total) into the skin at bedtime. (Patient taking differently: Inject 28 Units into the skin at bedtime. ) 30 mL 11  . loratadine (CLARITIN) 10 MG tablet Take 10 mg by mouth every evening.     Marland Kitchen omeprazole (PRILOSEC OTC) 20 MG tablet Take 40 mg by mouth every morning.     . tamsulosin (FLOMAX) 0.4 MG CAPS capsule Take 0.4 mg by mouth every evening.   3  . warfarin (COUMADIN) 3 MG tablet  Take as directed by [facility] Clinic 35 tablet 1  . blood glucose meter kit and supplies Dispense based on patient and insurance preference. Use up to four times daily as directed. (FOR ICD-10 E10.9, E11.9). 1 each 0  . Continuous Blood Gluc Sensor (FREESTYLE LIBRE 14 DAY SENSOR) MISC 1 each by Does not apply route every 14 (fourteen) days. Change every 2 weeks 2 each 11  . metoprolol tartrate (LOPRESSOR) 25 MG tablet Take 25 mg by mouth as needed (>130).     . nitroGLYCERIN (NITROSTAT) 0.4 MG SL tablet Place 1 tablet (0.4 mg total) under the tongue every 5 (five) minutes as needed for chest pain. (Patient not taking: Reported on 05/01/2019) 14 tablet 12   No current facility-administered medications for this encounter.     Allergies  Allergen Reactions  . Penicillins Rash and Other (See Comments)    Has patient had a PCN reaction causing immediate rash, facial/tongue/throat swelling, SOB or lightheadedness with hypotension: yes Has patient had a PCN reaction causing severe rash involving mucus membranes or skin necrosis: no  Has patient had a PCN reaction that required hospitalization: no Has patient had a PCN reaction occurring within the last 10 years: no If all of the above answers are "NO", then may proceed with Cephalosporin use.     Social History   Socioeconomic History  . Marital status: Married    Spouse name: Not on file  . Number of children: Not on file  . Years of education: Not on file  . Highest education level: Not on file  Occupational History  . Occupation: disabled  Social Needs  . Financial resource strain: Patient refused  . Food insecurity    Worry: Never true    Inability: Never true  . Transportation needs    Medical: No    Non-medical: No  Tobacco Use  . Smoking status: Never Smoker  . Smokeless tobacco: Never Used  Substance and Sexual Activity  . Alcohol use: Never    Frequency: Never  . Drug use: Never  . Sexual activity: Not on file  Lifestyle   . Physical activity    Days per week: 0 days    Minutes per session: 0 min  . Stress: Not on file  Relationships  . Social connections    Talks on phone: More than three times a week    Gets together: Never    Attends religious service: More than 4 times per year    Active member of club or organization: No    Attends meetings of clubs or organizations: Never    Relationship status: Married  . Intimate partner violence    Fear of current or ex partner: Not on file    Emotionally abused: Not on file    Physically abused: Not on file    Forced sexual activity: Not on file  Other Topics Concern  . Not on file  Social History Narrative  . Not on file     ROS- All systems are reviewed and negative except as per the HPI above.  Physical Exam: Vitals:   05/01/19 0955  BP: 114/72  Pulse: 81  Weight: 124.2 kg  Height: 6' (1.829 m)    GEN- The patient is well appearing obese male, alert and oriented x 3 today.   Head- normocephalic, atraumatic Eyes-  Sclera clear, conjunctiva pink Ears- hearing intact Oropharynx- clear Neck- supple  Lungs- Clear to ausculation bilaterally, normal work of breathing Heart- Regular rate and rhythm, no murmurs, rubs or gallops  GI- soft, NT, ND, + BS Extremities- no clubbing, cyanosis, or edema MS- no significant deformity or atrophy Skin- no rash or lesion Psych- euthymic mood, full affect Neuro- strength and sensation are intact  Wt Readings from Last 3 Encounters:  05/01/19 124.2 kg  04/23/19 124.6 kg  03/07/19 132.4 kg    EKG today demonstrates SR HR 81, PR 164, QRS 88, QTc 464  Echo 02/26/19 demonstrated  1. The left ventricle has normal systolic function with an ejection fraction of 60-65%. The cavity size was normal. Left ventricular diastolic Doppler parameters are consistent with impaired relaxation.  2. The right ventricle has normal systolic function. The cavity was normal. There is no increase in right ventricular wall  thickness.  3. Left atrial size was mildly dilated.  4. The aortic valve is tricuspid. Mild thickening of the aortic valve. Mild calcification of the aortic valve.  Epic records are reviewed at length today  Assessment and Plan:  1. Paroxysmal atrial fibrillation Patient converted to SR on amiodarone. ? 2/2 acute shigellosis infection.  General education about afib provided and questions answered. We also discussed his stroke risk and the risks and benefits of anticoagulation. Continue amiodarone 200 mg daily. Will consider stopping/decreasing at follow up if no further afib. Continue warfarin  This patients CHA2DS2-VASc Score and unadjusted Ischemic Stroke Rate (% per year) is equal to 3.2 % stroke rate/year from a score of 3  Above score calculated as 1 point each if present [CHF, HTN, DM, Vascular=MI/PAD/Aortic Plaque, Age if 65-74, or Male] Above score calculated as 2 points each if present [Age > 75, or Stroke/TIA/TE]   2. Obesity Body mass index is 37.13 kg/m. Lifestyle modification was discussed at length including regular exercise and weight reduction. Patient doing well, he has lost ~60 lbs.  3. Obstructive sleep apnea The importance of adequate treatment of sleep apnea was discussed today in order to improve our ability to maintain sinus rhythm long term. Compliant with CPAP.   4. ESRD on HD T/Th/Sat. Followed by nephrology.  5. HTN Stable, no changes today.   Follow up in AF clinic in 3 weeks.   [facility] [address] [place], [place] [address] [phone] 05/01/2019 10:36 AM

## 2019-05-01 NOTE — Telephone Encounter (Signed)
Pt takes warfarin for afib with CHADS2VASc score of 4 (HTN, DM, prior VTE (PE in 2006 treated with Coumadin for 6 months)). Ok to hold warfarin for 3 days prior to procedure.

## 2019-05-01 NOTE — Progress Notes (Addendum)
History of Present Illness:  Patient is a 63 y.o. year old male who presents for examination of his malfunctioning fistula.   He had a  history of stage IV chronic kidney disease status post nephrectomy that presents for ongoing follow-up.  We performed a left radiocephalic AV fistula on 12/30/6642.  On his initial postop visit there was concern for poor maturation near the anastomosis as well as some poor flow volumes.  He has been doing exercises by squeezing a ball with his left hand.  He is now on HD and his center reports that the fistula is hard to stick.     The patient was initially diagnosed with atrial fibrillation 04/22/19 while he was hospitalized for diarrhea/food poisoning. Telemetry initially showed afib with RVR with rates up to 200 bpm. His electrolytes were within normal limits. Echo 02/26/19 showed normal EF 60-65% with mild LAE.  He has been hypotensive with recent health changes and now the left radial cephalic fistula has no thrill.   Past medical history includes:SVt, A fib started on Coumadin recently now in regular HR, DM, Renal cell carcinoma, and ESRD with right TDC placed by [facility].  Statin yes BB NO Asa yes Anticoagulation Coumadin  Past Medical History:  Diagnosis Date  . Arthritis    knees  . Bilateral renal cysts 03/23/2018   Noted MRI ABD  . Cholelithiasis 03/19/2018   noted on CT AB/Pelvis  . CKD (chronic kidney disease), stage IV [facility])    nephrologist-- dr Hollie Salk [name]);  05-01-2018 has not started dialysis, scheduled for AV fistula creation 05-07-2018  . Diverticulosis of colon 04/19/2018   Noted on CT abd/pelvis  . GERD (gastroesophageal reflux disease)   . Hepatic steatosis 03/19/2018   Mild diffuse, noted on CT AB/Pelvis  . History of kidney stones   . History of pulmonary embolus (PE) 03/2008   treated with coumadin for 6 months  . History of sepsis 04/20/2018   per discharge note , probable UTI  . Hypertension   . Nocturia    . OSA on CPAP   . Pancreatic lesion    0.4cm cystic per CT 07/ 2019  . Pneumonia   . Pulmonary nodule    Solid 59m Right Lower Lobe  . Right renal mass 04/10/2018   new dx--  scheduled for nephrectomy 05-16-2018  . S/P ureteral stent placement: 04/16/2018 04/19/2018    Past Surgical History:  Procedure Laterality Date  . AV FISTULA PLACEMENT Left 05/07/2018   Procedure: Creation of Left arm Radiocephalic Fistula;  Surgeon: CMarty Heck MD;  [facility]  Service: Vascular;  Laterality: Left;  . CYSTOSCOPY/RETROGRADE/URETEROSCOPY/STONE EXTRACTION WITH BASKET  x2 last one 1990s approx.  . CYSTOSCOPY/URETEROSCOPY/HOLMIUM LASER/STENT PLACEMENT Left 04/16/2018   Procedure: CYSTOSCOPY/LEFT URETEROSCOPY/LEFT RETROGRADE/STENT PLACEMENT;  Surgeon: BLucas Mallow MD;  Location: [facility];  Service: Urology;  Laterality: Left;  . EUS N/A 05/03/2018   Procedure: UPPER ENDOSCOPIC ULTRASOUND (EUS) RADIAL;  Surgeon: JMilus Banister MD;  Location: [facility];  Service: Gastroenterology;  Laterality: N/A;  . EUS N/A 05/03/2018   Procedure: UPPER ENDOSCOPIC ULTRASOUND (EUS) LINEAR;  Surgeon: JMilus Banister MD;  [facility];  Service: Gastroenterology;  Laterality: N/A;  . EXTRACORPOREAL SHOCK WAVE LITHOTRIPSY  x3  last one 2004 approx.  .Marland KitchenFINE NEEDLE ASPIRATION N/A 05/03/2018   Procedure: FINE NEEDLE ASPIRATION (FNA) LINEAR;  Surgeon: JMilus Banister MD;  Location: [facility];  Service: Gastroenterology;  Laterality: N/A;  .  LAPAROSCOPIC NEPHRECTOMY, HAND ASSISTED Right 05/16/2018   Procedure: HAND ASSISTED LAPAROSCOPIC RIGHT NEPHRECTOMY;  Surgeon: Lucas Mallow, MD;  Location: [facility];  Service: Urology;  Laterality: Right;  . left index finger attachment  1980s   3-4 surgeries  . TOE SURGERY  1980s   in beween 2nd and 3rd toes cyst removed     Social History Social History   Tobacco Use  . Smoking status: Never Smoker  . Smokeless tobacco: Never Used  Substance  Use Topics  . Alcohol use: Never    Frequency: Never  . Drug use: Never    Family History Family History  Problem Relation Age of Onset  . Alzheimer's disease Mother   . Alzheimer's disease Father   . Heart disease Father   . Heart attack Father   . Cancer - Other Sister     Allergies  Allergies  Allergen Reactions  . Penicillins Rash and Other (See Comments)    Has patient had a PCN reaction causing immediate rash, facial/tongue/throat swelling, SOB or lightheadedness with hypotension: yes Has patient had a PCN reaction causing severe rash involving mucus membranes or skin necrosis: no Has patient had a PCN reaction that required hospitalization: no Has patient had a PCN reaction occurring within the last 10 years: no If all of the above answers are "NO", then may proceed with Cephalosporin use.      Current Outpatient Medications  Medication Sig Dispense Refill  . acetaminophen (TYLENOL) 500 MG tablet Take 500 mg by mouth every 6 (six) hours as needed for mild pain or headache.    . allopurinol (ZYLOPRIM) 100 MG tablet Take 1 tablet (100 mg total) by mouth every evening. 30 tablet 0  . amiodarone (PACERONE) 400 MG tablet Take 0.5 tablets (200 mg total) by mouth 2 (two) times daily for 7 days, THEN 0.5 tablets (200 mg total) 2 (two) times daily for 23 days. 30 tablet 0  . aspirin EC 81 MG EC tablet Take 1 tablet (81 mg total) by mouth daily. 30 tablet 0  . atorvastatin (LIPITOR) 40 MG tablet Take 1 tablet (40 mg total) by mouth daily. (Patient taking differently: Take 40 mg by mouth every evening. ) 30 tablet 0  . blood glucose meter kit and supplies Dispense based on patient and insurance preference. Use up to four times daily as directed. (FOR ICD-10 E10.9, E11.9). 1 each 0  . fluticasone (FLONASE) 50 MCG/ACT nasal spray Place 1 spray into both nostrils daily as needed for allergies or rhinitis.    Marland Kitchen insulin glargine (LANTUS) 100 UNIT/ML injection Inject 0.32 mLs (32 Units  total) into the skin at bedtime. (Patient taking differently: Inject 28 Units into the skin at bedtime. ) 30 mL 11  . loratadine (CLARITIN) 10 MG tablet Take 10 mg by mouth every evening.     . nitroGLYCERIN (NITROSTAT) 0.4 MG SL tablet Place 1 tablet (0.4 mg total) under the tongue every 5 (five) minutes as needed for chest pain. 14 tablet 12  . omeprazole (PRILOSEC OTC) 20 MG tablet Take 40 mg by mouth every morning.     . tamsulosin (FLOMAX) 0.4 MG CAPS capsule Take 0.4 mg by mouth every evening.   3  . warfarin (COUMADIN) 3 MG tablet Take as directed by [facility] 35 tablet 1   No current facility-administered medications for this visit.     ROS:   General:  No weight loss, Fever, chills  HEENT: No recent headaches, no nasal bleeding,  no visual changes, no sore throat  Neurologic: No dizziness, blackouts, seizures. No recent symptoms of stroke or mini- stroke. No recent episodes of slurred speech, or temporary blindness.  Cardiac: No recent episodes of chest pain/pressure, no shortness of breath at rest.  No shortness of breath with exertion.  Positive history of atrial fibrillation or irregular heartbeat  Vascular: No history of rest pain in feet.  No history of claudication.  No history of non-healing ulcer, No history of DVT   Pulmonary: No home oxygen, no productive cough, no hemoptysis,  No asthma or wheezing  Musculoskeletal:  _0  Arthritis, _1  Low back pain,  _2  Joint pain  Hematologic:No history of hypercoagulable state.  No history of easy bleeding.  No history of anemia  Gastrointestinal: No hematochezia or melena,  No gastroesophageal reflux, no trouble swallowing  Urinary: _3  chronic Kidney disease, [x ] on HD - _4  MWF or [ x] TTHS, _5  Burning with urination, _6  Frequent urination, _7  Difficulty urinating;   Skin: No rashes  Psychological: No history of anxiety,  No history of depression   Physical Examination  Vitals:   05/01/19 1443  BP: 104/69   Pulse: 74  Resp: 16  Temp: 97.9 F (36.6 C)  TempSrc: Temporal  SpO2: 100%  Weight: 273 lb 14.4 oz (124.2 kg)  Height: _8  (1.854 m)    Body mass index is 36.14 kg/m.  General:  Alert and oriented, no acute distress HEENT: Normal Neck: No bruit or JVD Pulmonary: Clear to auscultation bilaterally Cardiac: Regular Rate and Rhythm without murmur Gastrointestinal: Soft, non-tender, non-distended, no mass, no scars Skin: No rash, well healed left forearm scar from previous access. Extremity Pulses:  2+ radial, brachial pulses bilaterally Musculoskeletal: No deformity or edema  Neurologic: Upper and lower extremity motor 5/5 and symmetric  DATA:  Fistula duplex 05/01/2019 OUTFLOW VEINPSV (cm/s)Diameter (cm)Depth (cm)          Describe            +------------+----------+-------------+----------+-----------------------------+ AC Fossa       112        0.37        0.66              patent             +------------+----------+-------------+----------+-----------------------------+ Prox Forearm    8         0.34        0.60              patent             +------------+----------+-------------+----------+-----------------------------+ Mid Forearm     7         0.36        0.51        partially-occlusive      +------------+----------+-------------+----------+-----------------------------+ Dist Forearm    0         0.29        0.44   occluded and competing branch +------------+----------+-------------+----------+-----------------------------+       Summary: Left radiocephalic arteriovenous fistula demotrates occlusive thrombus within the outflow vein just proximal to the anastmosis to the distal forearm.  04/2018 vein mapping +-----------------+-------------+----------+--------+ Left Basilic     Diameter (cm)Depth (cm)Findings +-----------------+-------------+----------+--------+ Mid upper arm        0.59                         +-----------------+-------------+----------+--------+ Dist  upper arm       0.51                        +-----------------+-------------+----------+--------+ Antecubital fossa    0.50                        +-----------------+-------------+----------+--------+ Prox forearm         0.41                        +-----------------+-------------+----------+--------+    ASSESSMENT:  ESRD with HD TTS via right TDC New A fib on Coumadin Thrombosed left radial cephalic AV fistula.   PLAN: He has an acceptable left basilic vein on vein mapping.  I will schedule him for basilic AV fistula creation verses graft since he is now in ESRD and on HD.  He is right hand dominant.  He and his wife agree to this plan.    Roxy Horseman PA-C [facility] Office: [phone]  MD in clinic [place]

## 2019-05-01 NOTE — Telephone Encounter (Signed)
Request for Surgical Clearance  1. What type of surgery is being performed?  CREATION OF ARTERIOVENOUS FISTULA VERSUS INSERTION OF A/V GRAFT LEFT ARM    2. When is this surgery scheduled? 05/15/2019  3. What type of clearance is required (medical clearance vs. Pharmacy clearance to hold med vs. Both)? BOTH  LEXISCAN SCHEDULED FOR 05/08/2019    4. Are there any medications that need to be held prior to surgery and how long?  COUMADIN HOLD X 3 DAYS PRE-OP    5. Practice name and name of physician performing surgery?   [facility] DR. Marshall    6.  What is your office phone number? [phone]    7. What is your office fax number? (Be sure to include anyone who it needs to go Attn to) [phone]    8. Anesthesia type (None, local, MAC, general)? MAC   REMINDER TO USER: Remember to please route this message to P [facility] PREOP in a phone note.

## 2019-05-01 NOTE — Telephone Encounter (Signed)
Called patient a second time and left another VM to call back to confirm his stress test.

## 2019-05-02 ENCOUNTER — Telehealth (HOSPITAL_COMMUNITY): Payer: Self-pay

## 2019-05-02 NOTE — Telephone Encounter (Signed)
   Primary Cardiologist: Elouise Munroe, MD  Chart reviewed as part of pre-operative protocol coverage. Given past medical history and time since last visit, based on ACC/AHA guidelines, Keiji Melland. would be at acceptable risk for the planned procedure. He is scheduled for lexiscan myoview on 05/07/19. If this were to be high risk he would need further cardiac workup. If low risk, proceed with graft placement. Pt is having no anginal symptoms.   According to our pharmacy protocol: Pt takes warfarin for afib with CHADS2VASc score of 4 (HTN, DM, prior VTE (PE in 2006 treated with Coumadin for 6 months)). Ok to hold warfarin for 3 days prior to procedure.   I will route this recommendation to the requesting party via Epic fax function.  I will leave in our preop pool until stress test done and reviewed. If low risk, proceed with surgery.   Please call with questions.  Daune Perch, NP 05/02/2019, 9:40 AM

## 2019-05-02 NOTE — Telephone Encounter (Signed)
Unable to reach patient. Encounter complete.

## 2019-05-03 ENCOUNTER — Telehealth (HOSPITAL_COMMUNITY): Payer: Self-pay

## 2019-05-03 NOTE — Telephone Encounter (Signed)
Counter complete.

## 2019-05-07 ENCOUNTER — Other Ambulatory Visit: Payer: Self-pay

## 2019-05-07 ENCOUNTER — Ambulatory Visit: Payer: PRIVATE HEALTH INSURANCE | Admitting: Internal Medicine

## 2019-05-07 ENCOUNTER — Inpatient Hospital Stay (HOSPITAL_COMMUNITY): Admission: RE | Admit: 2019-05-07 | Payer: PRIVATE HEALTH INSURANCE | Source: Ambulatory Visit

## 2019-05-07 ENCOUNTER — Ambulatory Visit (INDEPENDENT_AMBULATORY_CARE_PROVIDER_SITE_OTHER): Payer: PRIVATE HEALTH INSURANCE | Admitting: *Deleted

## 2019-05-07 DIAGNOSIS — Z7901 Long term (current) use of anticoagulants: Secondary | ICD-10-CM | POA: Diagnosis not present

## 2019-05-07 DIAGNOSIS — I4891 Unspecified atrial fibrillation: Secondary | ICD-10-CM

## 2019-05-07 LAB — POCT INR: INR: 2.7 (ref 2.0–3.0)

## 2019-05-07 NOTE — Telephone Encounter (Signed)
myoview scheduled for 9/11

## 2019-05-07 NOTE — Patient Instructions (Signed)
Description   Continue  taking 3mg  (1 tablet) daily. Take last dose on 05/11/19, hold until after procedure on 05/15/19, Resume coumadin after procedure per Dr. Claretha Cooper instructions.  Recheck 2 weeks. Call [facility] with new meds, questions or concerns [phone].

## 2019-05-08 ENCOUNTER — Ambulatory Visit (HOSPITAL_COMMUNITY)
Admission: RE | Admit: 2019-05-08 | Discharge: 2019-05-08 | Disposition: A | Discharge status: Home / Self Care | Payer: PRIVATE HEALTH INSURANCE | Source: Ambulatory Visit | Attending: Cardiology | Admitting: Cardiology

## 2019-05-08 ENCOUNTER — Ambulatory Visit (HOSPITAL_COMMUNITY): Payer: PRIVATE HEALTH INSURANCE

## 2019-05-08 DIAGNOSIS — I471 Supraventricular tachycardia: Secondary | ICD-10-CM

## 2019-05-08 DIAGNOSIS — R55 Syncope and collapse: Secondary | ICD-10-CM | POA: Diagnosis not present

## 2019-05-08 MED ORDER — REGADENOSON 0.4 MG/5ML IV SOLN
0.4000 mg | Freq: Once | INTRAVENOUS | Status: AC
  Administered 2019-05-08: 0.4 mg via INTRAVENOUS

## 2019-05-08 MED ORDER — TECHNETIUM TC 99M TETROFOSMIN IV KIT
31.0000 | PACK | Freq: Once | INTRAVENOUS | Status: AC | PRN
  Administered 2019-05-08: 31 via INTRAVENOUS
  Filled 2019-05-08: qty 31

## 2019-05-08 MED ORDER — AMINOPHYLLINE 25 MG/ML IV SOLN
75.0000 mg | Freq: Once | INTRAVENOUS | Status: AC
  Administered 2019-05-08: 75 mg via INTRAVENOUS

## 2019-05-10 ENCOUNTER — Other Ambulatory Visit: Payer: Self-pay

## 2019-05-10 ENCOUNTER — Ambulatory Visit (HOSPITAL_COMMUNITY)
Admission: RE | Admit: 2019-05-10 | Discharge: 2019-05-10 | Disposition: A | Discharge status: Home / Self Care | Payer: PRIVATE HEALTH INSURANCE | Source: Ambulatory Visit | Attending: Cardiology | Admitting: Cardiology

## 2019-05-10 LAB — MYOCARDIAL PERFUSION IMAGING
LV dias vol: 118 mL (ref 62–150)
LV sys vol: 56 mL
Peak HR: 96 {beats}/min
Rest HR: 73 {beats}/min
SDS: 6
SRS: 1
SSS: 7
TID: 0.79

## 2019-05-10 MED ORDER — TECHNETIUM TC 99M TETROFOSMIN IV KIT
32.0000 | PACK | Freq: Once | INTRAVENOUS | Status: AC | PRN
  Administered 2019-05-10: 32 via INTRAVENOUS

## 2019-05-13 ENCOUNTER — Other Ambulatory Visit (HOSPITAL_COMMUNITY)
Admission: RE | Admit: 2019-05-13 | Discharge: 2019-05-13 | Disposition: A | Discharge status: Home / Self Care | Payer: PRIVATE HEALTH INSURANCE | Source: Ambulatory Visit | Attending: Vascular Surgery | Admitting: Vascular Surgery

## 2019-05-13 DIAGNOSIS — Z01812 Encounter for preprocedural laboratory examination: Secondary | ICD-10-CM | POA: Insufficient documentation

## 2019-05-13 DIAGNOSIS — Z20828 Contact with and (suspected) exposure to other viral communicable diseases: Secondary | ICD-10-CM | POA: Insufficient documentation

## 2019-05-13 LAB — SARS CORONAVIRUS 2 (TAT 6-24 HRS): SARS Coronavirus 2: NEGATIVE

## 2019-05-13 NOTE — Telephone Encounter (Signed)
    Dr. Marlou Porch can you address this patients recent Myoview stress test findings in the setting of need for creation of AV fistula versus insertion of AV graft per Dr. Donzetta Matters for ESRD?   He was seen in hospital consultation, most recently for atrial fibrillation with RVR however was previously hospitalized 03/06/2019 to 03/07/2019 for chest pain thought to be more atypical with negative enzymes and no acute changes on EKG.  Plan was for outpatient Lexiscan Myoview stress test.  This was performed 05/08/2019 which showed no ST segment deviation however there was a small defect of severe severity present in the apex location.  Study was considered abnormal with significant apical thinning versus infarct.  Notes state cannot exclude very mild superimposed apical lateral ischemia.  Please send your recommendations to the preoperative pool.  Thank you  Sharee Pimple

## 2019-05-13 NOTE — Telephone Encounter (Signed)
Reviewed NUC stress images. Overall small defect, lower risk.  He may proceed with dialysis access surgery with Dr. Donzetta Matters.   Candee Furbish, MD

## 2019-05-14 ENCOUNTER — Encounter (HOSPITAL_COMMUNITY): Payer: Self-pay | Admitting: *Deleted

## 2019-05-14 ENCOUNTER — Other Ambulatory Visit: Payer: Self-pay

## 2019-05-14 MED ORDER — VANCOMYCIN HCL 10 G IV SOLR
1500.0000 mg | INTRAVENOUS | Status: AC
  Administered 2019-05-15: 1500 mg via INTRAVENOUS
  Filled 2019-05-14 (×2): qty 1500

## 2019-05-14 NOTE — Progress Notes (Signed)
Spoke with pt and his wife, George Lewis for pre-op call. Pt denies cardiac history, but pt ws diagnosed with A-fib in August and was placed on Coumadin then. Pt's cardiologist is Dr. Marlou Porch. Cardiac clearance in Epic on 05/01/19. Was instructed to hold it for 3 days prior to surgery, they state his last dose was 05/11/19. Pt is a type 2 diabetic. Last A1C was 7.4 in June. States his fasting blood sugar is usually in the 80's - 90's. Instructed pt to take 1/2 of his regular dose of Lantus Insulin this evening, he will take 13 units. Instructed pt to check his blood sugar when he gets up in the AM. If blood sugar is 70 or below, treat with 1/2 cup of clear juice (apple or cranberry) and recheck blood sugar 15 minutes after drinking juice. Pt and wife voiced understanding.  Pt had his Covid test done yesterday and it is negative. He states he's been in quarantine since the test was done except going to dialysis today. He states he wore his mask when he went.  Informed pt's wife that she may wait in the waiting room while pt is in pre-op, surgery and PACU. She voiced understanding.

## 2019-05-14 NOTE — Anesthesia Preprocedure Evaluation (Addendum)
Anesthesia Evaluation  Patient identified by MRN, date of birth, ID band Patient awake    Reviewed: Allergy & Precautions, NPO status   Airway Mallampati: II  TM Distance: >3 FB     Dental   Pulmonary    breath sounds clear to auscultation       Cardiovascular hypertension,  Rhythm:Regular Rate:Normal     Neuro/Psych    GI/Hepatic Neg liver ROS, GERD  ,  Endo/Other  diabetes  Renal/GU Renal disease     Musculoskeletal   Abdominal   Peds  Hematology   Anesthesia Other Findings   Reproductive/Obstetrics                            Anesthesia Physical Anesthesia Plan  ASA: III  Anesthesia Plan: General and MAC   Post-op Pain Management:    Induction: Intravenous  PONV Risk Score and Plan: Ondansetron, Dexamethasone and Midazolam  Airway Management Planned: Oral ETT and LMA  Additional Equipment:   Intra-op Plan:   Post-operative Plan: Possible Post-op intubation/ventilation  Informed Consent: I have reviewed the patients History and Physical, chart, labs and discussed the procedure including the risks, benefits and alternatives for the proposed anesthesia with the patient or authorized representative who has indicated his/her understanding and acceptance.     Dental advisory given  Plan Discussed with: CRNA and Anesthesiologist  Anesthesia Plan Comments:       Anesthesia Quick Evaluation

## 2019-05-14 NOTE — Telephone Encounter (Signed)
   Reviewed chart. Patient with hx of Renal CA s/p nephrectomy, ESRD on dialysis, diabetes mellitus, hypertension, hyperlipidemia, prior pulmonary embolism, paroxysmal atrial fibrillation.  He needs revision of his AV fistula.    He was admitted in July 2020 with chest pain.  Hs-Troponins were neg.  He was set up for an OP Myoview.  He was admitted in August 2020 with AF with RVR in the setting of GI illness.  He was placed on Coumadin anticoagulation and Amiodarone.  He has maintained normal sinus rhythm.    Echocardiogram 02/26/2019: EF 60-65, impaired relaxation, mild LAE. Myoview 05/10/2019: Apical thinning vs infarct, EF 52 (reviewed by Dr. Marlou Porch; felt to be low risk).  PLAN:  1. Patient may proceed with non-cardiac surgery without further testing. He is at acceptable risk. 2. He may hold Warfarin for 3 days prior to his surgery and resume as soon as possible post op. 3. Please call with questions. 4. This note will be routed to Dr. Donzetta Matters in [place] and faxed to his office.  Richardson Dopp, PA-C    05/14/2019 8:26 AM

## 2019-05-15 ENCOUNTER — Ambulatory Visit (HOSPITAL_COMMUNITY): Payer: PRIVATE HEALTH INSURANCE | Admitting: Anesthesiology

## 2019-05-15 ENCOUNTER — Encounter (HOSPITAL_COMMUNITY)
Admission: RE | Disposition: A | Discharge status: Home / Self Care | Payer: PRIVATE HEALTH INSURANCE | Source: Home / Self Care | Attending: Vascular Surgery

## 2019-05-15 ENCOUNTER — Ambulatory Visit: Payer: PRIVATE HEALTH INSURANCE | Admitting: Adult Health

## 2019-05-15 ENCOUNTER — Ambulatory Visit (HOSPITAL_COMMUNITY)
Admission: RE | Admit: 2019-05-15 | Discharge: 2019-05-15 | Disposition: A | Discharge status: Home / Self Care | Payer: PRIVATE HEALTH INSURANCE | Attending: Vascular Surgery | Admitting: Vascular Surgery

## 2019-05-15 ENCOUNTER — Encounter (HOSPITAL_COMMUNITY): Payer: Self-pay

## 2019-05-15 DIAGNOSIS — I4891 Unspecified atrial fibrillation: Secondary | ICD-10-CM | POA: Insufficient documentation

## 2019-05-15 DIAGNOSIS — I120 Hypertensive chronic kidney disease with stage 5 chronic kidney disease or end stage renal disease: Secondary | ICD-10-CM | POA: Diagnosis not present

## 2019-05-15 DIAGNOSIS — N186 End stage renal disease: Secondary | ICD-10-CM | POA: Diagnosis present

## 2019-05-15 DIAGNOSIS — Z79899 Other long term (current) drug therapy: Secondary | ICD-10-CM | POA: Insufficient documentation

## 2019-05-15 DIAGNOSIS — Z794 Long term (current) use of insulin: Secondary | ICD-10-CM | POA: Diagnosis not present

## 2019-05-15 DIAGNOSIS — G4733 Obstructive sleep apnea (adult) (pediatric): Secondary | ICD-10-CM | POA: Diagnosis not present

## 2019-05-15 DIAGNOSIS — Z86711 Personal history of pulmonary embolism: Secondary | ICD-10-CM | POA: Diagnosis not present

## 2019-05-15 DIAGNOSIS — Z7901 Long term (current) use of anticoagulants: Secondary | ICD-10-CM | POA: Diagnosis not present

## 2019-05-15 DIAGNOSIS — E1122 Type 2 diabetes mellitus with diabetic chronic kidney disease: Secondary | ICD-10-CM | POA: Insufficient documentation

## 2019-05-15 DIAGNOSIS — N185 Chronic kidney disease, stage 5: Secondary | ICD-10-CM

## 2019-05-15 DIAGNOSIS — K219 Gastro-esophageal reflux disease without esophagitis: Secondary | ICD-10-CM | POA: Diagnosis not present

## 2019-05-15 DIAGNOSIS — Z7982 Long term (current) use of aspirin: Secondary | ICD-10-CM | POA: Diagnosis not present

## 2019-05-15 HISTORY — DX: Unspecified atrial fibrillation: I48.91

## 2019-05-15 HISTORY — DX: Type 2 diabetes mellitus without complications: E11.9

## 2019-05-15 HISTORY — PX: AV FISTULA PLACEMENT: SHX1204

## 2019-05-15 HISTORY — DX: Irritable bowel syndrome, unspecified: K58.9

## 2019-05-15 LAB — GLUCOSE, CAPILLARY
Glucose-Capillary: 77 mg/dL (ref 70–99)
Glucose-Capillary: 83 mg/dL (ref 70–99)

## 2019-05-15 LAB — POCT I-STAT 4, (NA,K, GLUC, HGB,HCT)
Glucose, Bld: 86 mg/dL (ref 70–99)
HCT: 37 % — ABNORMAL LOW (ref 39.0–52.0)
Hemoglobin: 12.6 g/dL — ABNORMAL LOW (ref 13.0–17.0)
Potassium: 4.4 mmol/L (ref 3.5–5.1)
Sodium: 139 mmol/L (ref 135–145)

## 2019-05-15 LAB — PROTIME-INR
INR: 1.4 — ABNORMAL HIGH (ref 0.8–1.2)
Prothrombin Time: 17.2 seconds — ABNORMAL HIGH (ref 11.4–15.2)

## 2019-05-15 SURGERY — ARTERIOVENOUS (AV) FISTULA CREATION
Anesthesia: Monitor Anesthesia Care | Site: Arm Upper | Laterality: Left

## 2019-05-15 MED ORDER — PROPOFOL 10 MG/ML IV BOLUS
INTRAVENOUS | Status: AC
  Filled 2019-05-15: qty 40

## 2019-05-15 MED ORDER — MIDAZOLAM HCL 5 MG/5ML IJ SOLN
INTRAMUSCULAR | Status: DC | PRN
  Administered 2019-05-15: 2 mg via INTRAVENOUS

## 2019-05-15 MED ORDER — LIDOCAINE-EPINEPHRINE (PF) 1 %-1:200000 IJ SOLN
INTRAMUSCULAR | Status: AC
  Filled 2019-05-15: qty 30

## 2019-05-15 MED ORDER — MIDAZOLAM HCL 2 MG/2ML IJ SOLN
INTRAMUSCULAR | Status: AC
  Filled 2019-05-15: qty 2

## 2019-05-15 MED ORDER — FENTANYL CITRATE (PF) 250 MCG/5ML IJ SOLN
INTRAMUSCULAR | Status: AC
  Filled 2019-05-15: qty 5

## 2019-05-15 MED ORDER — SODIUM CHLORIDE 0.9 % IV SOLN
INTRAVENOUS | Status: DC
  Administered 2019-05-15: 07:00:00 via INTRAVENOUS

## 2019-05-15 MED ORDER — FENTANYL CITRATE (PF) 250 MCG/5ML IJ SOLN
INTRAMUSCULAR | Status: DC | PRN
  Administered 2019-05-15 (×2): 50 ug via INTRAVENOUS

## 2019-05-15 MED ORDER — LIDOCAINE-EPINEPHRINE (PF) 1 %-1:200000 IJ SOLN
INTRAMUSCULAR | Status: DC | PRN
  Administered 2019-05-15: 17 mL

## 2019-05-15 MED ORDER — LIDOCAINE 2% (20 MG/ML) 5 ML SYRINGE
INTRAMUSCULAR | Status: DC | PRN
  Administered 2019-05-15: 20 mg via INTRAVENOUS

## 2019-05-15 MED ORDER — FENTANYL CITRATE (PF) 100 MCG/2ML IJ SOLN: 25.0000 ug | INTRAMUSCULAR | Status: DC | PRN

## 2019-05-15 MED ORDER — DIPHENHYDRAMINE HCL 50 MG/ML IJ SOLN
INTRAMUSCULAR | Status: DC | PRN
  Administered 2019-05-15: 12.5 mg via INTRAVENOUS

## 2019-05-15 MED ORDER — CHLORHEXIDINE GLUCONATE 4 % EX LIQD: 60.0000 mL | Freq: Once | CUTANEOUS | Status: DC

## 2019-05-15 MED ORDER — SODIUM CHLORIDE 0.9 % IV SOLN
INTRAVENOUS | Status: AC
  Filled 2019-05-15: qty 1.2

## 2019-05-15 MED ORDER — 0.9 % SODIUM CHLORIDE (POUR BTL) OPTIME
TOPICAL | Status: DC | PRN
  Administered 2019-05-15: 1000 mL

## 2019-05-15 MED ORDER — HYDROCODONE-ACETAMINOPHEN 5-325 MG PO TABS: 1.0000 | ORAL_TABLET | ORAL | 0 refills | Status: DC | PRN

## 2019-05-15 MED ORDER — DEXTROSE 50 % IV SOLN
INTRAVENOUS | Status: DC | PRN
  Administered 2019-05-15: .25 via INTRAVENOUS

## 2019-05-15 MED ORDER — PROPOFOL 10 MG/ML IV BOLUS
INTRAVENOUS | Status: DC | PRN
  Administered 2019-05-15: 10 mg via INTRAVENOUS

## 2019-05-15 MED ORDER — SODIUM CHLORIDE 0.9 % IV SOLN
INTRAVENOUS | Status: DC | PRN
  Administered 2019-05-15: 09:00:00 50 ug/min via INTRAVENOUS

## 2019-05-15 MED ORDER — SODIUM CHLORIDE 0.9 % IV SOLN
INTRAVENOUS | Status: DC | PRN
  Administered 2019-05-15: 500 mL

## 2019-05-15 MED ORDER — DEXTROSE 50 % IV SOLN
INTRAVENOUS | Status: AC
  Filled 2019-05-15: qty 50

## 2019-05-15 MED ORDER — PROPOFOL 500 MG/50ML IV EMUL
INTRAVENOUS | Status: DC | PRN
  Administered 2019-05-15: 50 ug/kg/min via INTRAVENOUS

## 2019-05-15 SURGICAL SUPPLY — 34 items
ARMBAND PINK RESTRICT EXTREMIT (MISCELLANEOUS) ×3 IMPLANT
CANISTER SUCT 3000ML PPV (MISCELLANEOUS) ×3 IMPLANT
CLIP VESOCCLUDE MED 6/CT (CLIP) ×3 IMPLANT
CLIP VESOCCLUDE SM WIDE 6/CT (CLIP) ×3 IMPLANT
COVER PROBE W GEL 5X96 (DRAPES) ×3 IMPLANT
COVER WAND RF STERILE (DRAPES) IMPLANT
DERMABOND ADVANCED (GAUZE/BANDAGES/DRESSINGS) ×2
DERMABOND ADVANCED .7 DNX12 (GAUZE/BANDAGES/DRESSINGS) ×1 IMPLANT
ELECT REM PT RETURN 9FT ADLT (ELECTROSURGICAL) ×3
ELECTRODE REM PT RTRN 9FT ADLT (ELECTROSURGICAL) ×1 IMPLANT
GLOVE BIO SURGEON STRL SZ 6.5 (GLOVE) ×2 IMPLANT
GLOVE BIO SURGEON STRL SZ7.5 (GLOVE) ×6 IMPLANT
GLOVE BIO SURGEONS STRL SZ 6.5 (GLOVE) ×1
GLOVE BIOGEL PI IND STRL 6.5 (GLOVE) ×3 IMPLANT
GLOVE BIOGEL PI INDICATOR 6.5 (GLOVE) ×6
GLOVE ECLIPSE 6.5 STRL STRAW (GLOVE) ×3 IMPLANT
GOWN STRL REUS W/ TWL LRG LVL3 (GOWN DISPOSABLE) ×2 IMPLANT
GOWN STRL REUS W/ TWL XL LVL3 (GOWN DISPOSABLE) ×2 IMPLANT
GOWN STRL REUS W/TWL LRG LVL3 (GOWN DISPOSABLE) ×4
GOWN STRL REUS W/TWL XL LVL3 (GOWN DISPOSABLE) ×4
HOVERMATT SINGLE USE (MISCELLANEOUS) IMPLANT
INSERT FOGARTY SM (MISCELLANEOUS) IMPLANT
KIT BASIN OR (CUSTOM PROCEDURE TRAY) ×3 IMPLANT
KIT TURNOVER KIT B (KITS) ×3 IMPLANT
NS IRRIG 1000ML POUR BTL (IV SOLUTION) ×3 IMPLANT
PACK CV ACCESS (CUSTOM PROCEDURE TRAY) ×3 IMPLANT
PAD ARMBOARD 7.5X6 YLW CONV (MISCELLANEOUS) ×6 IMPLANT
SUT MNCRL AB 4-0 PS2 18 (SUTURE) ×3 IMPLANT
SUT PROLENE 6 0 BV (SUTURE) ×3 IMPLANT
SUT VIC AB 3-0 SH 27 (SUTURE) ×2
SUT VIC AB 3-0 SH 27X BRD (SUTURE) ×1 IMPLANT
TOWEL GREEN STERILE (TOWEL DISPOSABLE) ×3 IMPLANT
UNDERPAD 30X30 (UNDERPADS AND DIAPERS) ×3 IMPLANT
WATER STERILE IRR 1000ML POUR (IV SOLUTION) ×3 IMPLANT

## 2019-05-15 NOTE — Anesthesia Postprocedure Evaluation (Signed)
Anesthesia Post Note  Patient: George Lewis.  Procedure(s) Performed: Creation of Brachiocephalic fistula left arm (Left Arm Upper)     Patient location during evaluation: PACU Anesthesia Type: MAC Level of consciousness: awake Pain management: pain level controlled Respiratory status: spontaneous breathing Cardiovascular status: stable Postop Assessment: no apparent nausea or vomiting Anesthetic complications: no    Last Vitals:  Vitals:   05/15/19 0957 05/15/19 1007  BP: 120/65 115/61  Pulse: 69 65  Resp: 19 18  Temp:  (!) 36.1 C  SpO2: 100% 100%    Last Pain:  Vitals:   05/15/19 1007  TempSrc:   PainSc: 0-No pain                 Taunja Brickner

## 2019-05-15 NOTE — Op Note (Signed)
    Patient name: George Lewis. MRN: [id number] DOB: 23-Oct-1955 Sex: male  05/15/2019 Pre-operative Diagnosis: End-stage renal disease Post-operative diagnosis:  Same Surgeon:  Erlene Quan C. Donzetta Matters, MD Assistant: Arlee Muslim, PA Procedure Performed:   Left arm first stage basilic vein transposition fistula  Indications: 63 year old male with end-stage renal disease.  Has a failed left radiocephalic AV fistula.  He is now indicated for upper arm fistula versus graft.  Findings: There was a suitable appearing basilic vein by both duplex and open examination.  The artery was large free of disease.  At completion there is a strong thrill in all vein as well as a palpable radial pulse the wrist.   Procedure:  The patient was identified in the holding area and taken to the operating room where is placed upon the operating table MAC anesthesia induced.  Sterilely prepped draped left upper extremity usual fashion antibiotics were minister and timeout was called.  We began using ultrasound identified a basilic vein.  There is really no identifiable cephalic vein.  There was a palpable brachial pulse above the antecubitum.  Below the antecubitum the basilic vein was actually quite diminutive.  The arm was anesthetized with 1% lidocaine with epinephrine.  Curvilinear incision was made between the palpable brachial pulse and identified basilic vein.  We dissected out the vein marked for orientation.  Branches were taken between clips and ties.  We then dissected out the artery placed a vessel loop around it.  Vein was transected distally and tied off.  It was spatulated flushed with heparinized saline.  It was reclamped.  We then clamped the artery distally proximally opened longitudinally flushed heparinized saline by directions.  The vein was then sewn inside of the artery with 6-0 Prolene suture.  Prior to completion anastomosis we allowed flushing all directions.  Upon completion there was a good thrill in  the vein palpable radial pulse the wrist both these were confirmed with Doppler.  Satisfied we irrigated the wound obtain hemostasis closed in layers Vicryl Monocryl.  Dermabond was placed to the level the skin.  He is awake and anesthesia having tolerated procedure without immediate complication.  All counts were correct at completion.  EBL 20 cc    Lexxus Underhill C. Donzetta Matters, MD [facility] Office: [phone] Pager: [phone]

## 2019-05-15 NOTE — Discharge Instructions (Signed)
° °  [facility] ° °Discharge Instructions ° °AV Fistula or Graft Surgery for Dialysis Access ° °Please refer to the following instructions for your post-procedure care. Your surgeon or physician assistant will discuss any changes with you. ° °Activity ° °You may drive the day following your surgery, if you are comfortable and no longer taking prescription pain medication. Resume full activity as the soreness in your incision resolves. ° °Bathing/Showering ° °You may shower after you go home. Keep your incision dry for 48 hours. Do not soak in a bathtub, hot tub, or swim until the incision heals completely. You may not shower if you have a hemodialysis catheter. ° °Incision Care ° °Clean your incision with mild soap and water after 48 hours. Pat the area dry with a clean towel. You do not need a bandage unless otherwise instructed. Do not apply any ointments or creams to your incision. You may have skin glue on your incision. Do not peel it off. It will come off on its own in about one week. Your arm may swell a bit after surgery. To reduce swelling use pillows to elevate your arm so it is above your heart. Your doctor will tell you if you need to lightly wrap your arm with an ACE bandage. ° °Diet ° °Resume your normal diet. There are not special food restrictions following this procedure. In order to heal from your surgery, it is CRITICAL to get adequate nutrition. Your body requires vitamins, minerals, and protein. Vegetables are the best source of vitamins and minerals. Vegetables also provide the perfect balance of protein. Processed food has little nutritional value, so try to avoid this. ° °Medications ° °Resume taking all of your medications. If your incision is causing pain, you may take over-the counter pain relievers such as acetaminophen (Tylenol). If you were prescribed a stronger pain medication, please be aware these medications can cause nausea and constipation. Prevent  nausea by taking the medication with a snack or meal. Avoid constipation by drinking plenty of fluids and eating foods with high amount of fiber, such as fruits, vegetables, and grains. Do not take Tylenol if you are taking prescription pain medications. ° ° ° ° °Follow up °Your surgeon may want to see you in the office following your access surgery. If so, this will be arranged at the time of your surgery. ° °Please call us immediately for any of the following conditions: ° °Increased pain, redness, drainage (pus) from your incision site °Fever of 101 degrees or higher °Severe or worsening pain at your incision site °Hand pain or numbness. ° °Reduce your risk of vascular disease: ° °Stop smoking. If you would like help, call [facility] at 1-800-QUIT-NOW (1-800-784-8669) or [facility] at 336-586-4000 ° °Manage your cholesterol °Maintain a desired weight °Control your diabetes °Keep your blood pressure down ° °Dialysis ° °It will take several weeks to several months for your new dialysis access to be ready for use. Your surgeon will determine when it is OK to use it. Your nephrologist will continue to direct your dialysis. You can continue to use your Permcath until your new access is ready for use. ° °If you have any questions, please call the office at [phone]. ° °

## 2019-05-15 NOTE — Transfer of Care (Signed)
Immediate Anesthesia Transfer of Care Note  Patient: George Lewis.  Procedure(s) Performed: Creation of Brachiocephalic fistula left arm (Left Arm Upper)  Patient Location: PACU  Anesthesia Type:MAC  Level of Consciousness: awake, alert  and oriented  Airway & Oxygen Therapy: Patient Spontanous Breathing and Patient connected to nasal cannula oxygen  Post-op Assessment: Report given to RN, Post -op Vital signs reviewed and stable and Patient moving all extremities X 4  Post vital signs: Reviewed and stable  Last Vitals:  Vitals Value Taken Time  BP 109/61 05/15/19 0942  Temp    Pulse 73 05/15/19 0945  Resp 18 05/15/19 0945  SpO2 100 % 05/15/19 0945  Vitals shown include unvalidated device data.  Last Pain:  Vitals:   05/15/19 0648  TempSrc:   PainSc: 0-No pain      Patients Stated Pain Goal: 3 (06/28/58 4585)  Complications: No apparent anesthesia complications

## 2019-05-15 NOTE — H&P (Signed)
   History and Physical Update  The patient was interviewed and re-examined.  The patient's previous History and Physical has been reviewed and is unchanged from recent office visit. Plan for left arm avf.   Anwitha Mapes C. Donzetta Matters, MD [facility] Office: [phone] Pager: [phone]   05/15/2019, 8:22 AM

## 2019-05-16 ENCOUNTER — Encounter (HOSPITAL_COMMUNITY): Payer: Self-pay | Admitting: Vascular Surgery

## 2019-05-22 ENCOUNTER — Other Ambulatory Visit: Payer: Self-pay

## 2019-05-22 ENCOUNTER — Ambulatory Visit (HOSPITAL_COMMUNITY)
Admission: RE | Admit: 2019-05-22 | Discharge: 2019-05-22 | Disposition: A | Discharge status: Home / Self Care | Payer: PRIVATE HEALTH INSURANCE | Source: Ambulatory Visit | Attending: Physician Assistant | Admitting: Physician Assistant

## 2019-05-22 ENCOUNTER — Encounter (HOSPITAL_COMMUNITY): Payer: Self-pay | Admitting: Physician Assistant

## 2019-05-22 VITALS — BP 112/74 | HR 82 | Ht 72.0 in | Wt 273.0 lb

## 2019-05-22 DIAGNOSIS — Z6837 Body mass index (BMI) 37.0-37.9, adult: Secondary | ICD-10-CM | POA: Diagnosis not present

## 2019-05-22 DIAGNOSIS — Z85528 Personal history of other malignant neoplasm of kidney: Secondary | ICD-10-CM | POA: Diagnosis not present

## 2019-05-22 DIAGNOSIS — Z7982 Long term (current) use of aspirin: Secondary | ICD-10-CM | POA: Diagnosis not present

## 2019-05-22 DIAGNOSIS — K219 Gastro-esophageal reflux disease without esophagitis: Secondary | ICD-10-CM | POA: Insufficient documentation

## 2019-05-22 DIAGNOSIS — I129 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 kidney disease, or unspecified chronic kidney disease: Secondary | ICD-10-CM | POA: Insufficient documentation

## 2019-05-22 DIAGNOSIS — E1122 Type 2 diabetes mellitus with diabetic chronic kidney disease: Secondary | ICD-10-CM | POA: Insufficient documentation

## 2019-05-22 DIAGNOSIS — Z809 Family history of malignant neoplasm, unspecified: Secondary | ICD-10-CM | POA: Diagnosis not present

## 2019-05-22 DIAGNOSIS — I480 Paroxysmal atrial fibrillation: Secondary | ICD-10-CM

## 2019-05-22 DIAGNOSIS — I471 Supraventricular tachycardia: Secondary | ICD-10-CM | POA: Diagnosis not present

## 2019-05-22 DIAGNOSIS — Z8249 Family history of ischemic heart disease and other diseases of the circulatory system: Secondary | ICD-10-CM | POA: Diagnosis not present

## 2019-05-22 DIAGNOSIS — G4733 Obstructive sleep apnea (adult) (pediatric): Secondary | ICD-10-CM | POA: Diagnosis not present

## 2019-05-22 DIAGNOSIS — Z905 Acquired absence of kidney: Secondary | ICD-10-CM | POA: Insufficient documentation

## 2019-05-22 DIAGNOSIS — Z880 Allergy status to penicillin: Secondary | ICD-10-CM | POA: Insufficient documentation

## 2019-05-22 DIAGNOSIS — Z794 Long term (current) use of insulin: Secondary | ICD-10-CM | POA: Diagnosis not present

## 2019-05-22 DIAGNOSIS — Z7901 Long term (current) use of anticoagulants: Secondary | ICD-10-CM | POA: Diagnosis not present

## 2019-05-22 DIAGNOSIS — M1710 Unilateral primary osteoarthritis, unspecified knee: Secondary | ICD-10-CM | POA: Insufficient documentation

## 2019-05-22 DIAGNOSIS — Z86711 Personal history of pulmonary embolism: Secondary | ICD-10-CM | POA: Diagnosis not present

## 2019-05-22 DIAGNOSIS — Z79899 Other long term (current) drug therapy: Secondary | ICD-10-CM | POA: Diagnosis not present

## 2019-05-22 DIAGNOSIS — N186 End stage renal disease: Secondary | ICD-10-CM | POA: Insufficient documentation

## 2019-05-22 NOTE — Progress Notes (Signed)
Primary Care Physician: Esaw Grandchild, NP Primary Cardiologist: Dr Margaretann Loveless  Primary Electrophysiologist: none Referring Physician: Dr Christy Sartorius. is a 63 y.o. male with a history of right renal cell carcinomas/pnephrectomy in 9/19, ESRD on HD(TTS), HTN,HLD,SVT, DM type II, OSA on CPAP,morbid obesity, and paroxysmal atrial fibrillation who presents for consultation in the [facility].  The patient was initially diagnosed with atrial fibrillation 04/22/19 while he was hospitalized for diarrhea/food poisoning. Telemetry initially showed afib with RVR with rates up to 200 bpm. His electrolytes were within normal limits. Echo 02/26/19 showed normal EF 60-65% with mild LAE. Patient was started on amiodarone and coumadin. He does have OSA and is on CPAP.  On follow up today, patient reports that he is doing reasonably well. He has not had any heart racing or palpitations and is in SR today. He recently underwent AV fistula creation.  Today, he denies symptoms of palpitations, chest pain, shortness of breath, orthopnea, PND, lower extremity edema, dizziness, presyncope, syncope, snoring, daytime somnolence, bleeding, or neurologic sequela. The patient is tolerating medications without difficulties and is otherwise without complaint today.   Atrial Fibrillation Risk Factors:  he does have symptoms or diagnosis of sleep apnea. he is compliant with CPAP therapy.   he has a BMI of Body mass index is 37.03 kg/m.Marland Kitchen Filed Weights   05/22/19 0844  Weight: 123.8 kg    Family History  Problem Relation Age of Onset  . Alzheimer's disease Mother   . Alzheimer's disease Father   . Heart disease Father   . Heart attack Father   . Cancer - Other Sister      Atrial Fibrillation Management history:  Previous antiarrhythmic drugs: amiodarone Previous cardioversions: none Previous ablations: none CHADS2VASC score: 3 Anticoagulation history:  warfarin   Past Medical History:  Diagnosis Date  . Anemia   . Arthritis    knees  . Atrial fibrillation ([place])   . Bilateral renal cysts 03/23/2018   Noted MRI ABD  . Cancer [facility])    right kidney cancer  . Cholelithiasis 03/19/2018   noted on CT AB/Pelvis  . CKD (chronic kidney disease), stage IV [facility])    nephrologist-- dr Hollie Salk [name]);  05-01-2018 has not started dialysis, scheduled for AV fistula creation 05-07-2018  . Diabetes mellitus without complication ([place])   . Diverticulosis of colon 04/19/2018   Noted on CT abd/pelvis  . GERD (gastroesophageal reflux disease)   . Hepatic steatosis 03/19/2018   Mild diffuse, noted on CT AB/Pelvis  . History of kidney stones   . History of pulmonary embolus (PE) 03/2008   treated with coumadin for 6 months  . History of sepsis 04/20/2018   per discharge note , probable UTI  . Hypertension   . IBS (irritable bowel syndrome)   . Nocturia   . OSA on CPAP   . Pancreatic lesion    0.4cm cystic per CT 07/ 2019  . Pneumonia   . Pulmonary nodule    Solid 30m Right Lower Lobe  . Right renal mass 04/10/2018   new dx--  scheduled for nephrectomy 05-16-2018  . S/P ureteral stent placement: 04/16/2018 04/19/2018   Past Surgical History:  Procedure Laterality Date  . AV FISTULA PLACEMENT Left 05/07/2018   Procedure: Creation of Left arm Radiocephalic Fistula;  Surgeon: CMarty Heck MD;  [facility];  Service: Vascular;  Laterality: Left;  . AV FISTULA PLACEMENT Left 05/15/2019   Procedure: Creation  of Brachiocephalic fistula left arm;  Surgeon: Waynetta Sandy, MD;  Location: [facility];  Service: Vascular;  Laterality: Left;  . CYSTOSCOPY/RETROGRADE/URETEROSCOPY/STONE EXTRACTION WITH BASKET  x2 last one 1990s approx.  . CYSTOSCOPY/URETEROSCOPY/HOLMIUM LASER/STENT PLACEMENT Left 04/16/2018   Procedure: CYSTOSCOPY/LEFT URETEROSCOPY/LEFT RETROGRADE/STENT PLACEMENT;  Surgeon: Lucas Mallow, MD;  Location: [facility];   Service: Urology;  Laterality: Left;  . EUS N/A 05/03/2018   Procedure: UPPER ENDOSCOPIC ULTRASOUND (EUS) RADIAL;  Surgeon: Milus Banister, MD;  Location: [facility];  Service: Gastroenterology;  Laterality: N/A;  . EUS N/A 05/03/2018   Procedure: UPPER ENDOSCOPIC ULTRASOUND (EUS) LINEAR;  Surgeon: Milus Banister, MD;  Location: [facility];  Service: Gastroenterology;  Laterality: N/A;  . EXTRACORPOREAL SHOCK WAVE LITHOTRIPSY  x3  last one 2004 approx.  Marland Kitchen FINE NEEDLE ASPIRATION N/A 05/03/2018   Procedure: FINE NEEDLE ASPIRATION (FNA) LINEAR;  Surgeon: Milus Banister, MD;  Location: [facility];  Service: Gastroenterology;  Laterality: N/A;  . LAPAROSCOPIC NEPHRECTOMY, HAND ASSISTED Right 05/16/2018   Procedure: HAND ASSISTED LAPAROSCOPIC RIGHT NEPHRECTOMY;  Surgeon: Lucas Mallow, MD;  Location: [facility];  Service: Urology;  Laterality: Right;  . left index finger attachment  1980s   3-4 surgeries  . TOE SURGERY  1980s   in beween 2nd and 3rd toes cyst removed    Current Outpatient Medications  Medication Sig Dispense Refill  . acetaminophen (TYLENOL) 500 MG tablet Take 1,000 mg by mouth every 6 (six) hours as needed for mild pain or headache.     . allopurinol (ZYLOPRIM) 100 MG tablet Take 1 tablet (100 mg total) by mouth every evening. 30 tablet 0  . amiodarone (PACERONE) 400 MG tablet Take 0.5 tablets (200 mg total) by mouth 2 (two) times daily for 7 days, THEN 0.5 tablets (200 mg total) 2 (two) times daily for 23 days. (Patient taking differently: Take 0.5 tablet (200 mg) by mouth once daily in the afternoon.) 30 tablet 0  . aspirin EC 81 MG EC tablet Take 1 tablet (81 mg total) by mouth daily. (Patient taking differently: Take 81 mg by mouth every evening. ) 30 tablet 0  . atorvastatin (LIPITOR) 40 MG tablet Take 1 tablet (40 mg total) by mouth daily. (Patient taking differently: Take 40 mg by mouth every evening. ) 30 tablet 0  . blood glucose meter kit and supplies Dispense based  on patient and insurance preference. Use up to four times daily as directed. (FOR ICD-10 E10.9, E11.9). 1 each 0  . diphenhydramine-acetaminophen (TYLENOL PM) 25-500 MG TABS tablet Take 2 tablets by mouth at bedtime as needed (sleep).    . fluticasone (FLONASE) 50 MCG/ACT nasal spray Place 1 spray into both nostrils daily as needed for allergies or rhinitis.    Marland Kitchen LANTUS SOLOSTAR 100 UNIT/ML Solostar Pen Inject 26 Units into the skin at bedtime.     Marland Kitchen loratadine (CLARITIN) 10 MG tablet Take 10 mg by mouth every evening.     . metoprolol tartrate (LOPRESSOR) 25 MG tablet Take 25 mg by mouth daily as needed (for elevated blood pressure (greater than 130)).    Marland Kitchen nitroGLYCERIN (NITROSTAT) 0.4 MG SL tablet Place 1 tablet (0.4 mg total) under the tongue every 5 (five) minutes as needed for chest pain. 14 tablet 12  . omeprazole (PRILOSEC OTC) 20 MG tablet Take 40 mg by mouth daily before breakfast.     . tamsulosin (FLOMAX) 0.4 MG CAPS capsule Take 0.4 mg by mouth every evening.  3  . warfarin (COUMADIN) 3 MG tablet Take as directed by [facility] (Patient taking differently: Take 3 mg by mouth every evening. Take as directed by [facility]) 35 tablet 1   No current facility-administered medications for this encounter.     Allergies  Allergen Reactions  . Penicillins Rash and Other (See Comments)    Has patient had a PCN reaction causing immediate rash, facial/tongue/throat swelling, SOB or lightheadedness with hypotension: yes Has patient had a PCN reaction causing severe rash involving mucus membranes or skin necrosis: no Has patient had a PCN reaction that required hospitalization: no Has patient had a PCN reaction occurring within the last 10 years: no If all of the above answers are "NO", then may proceed with Cephalosporin use.     Social History   Socioeconomic History  . Marital status: Married    Spouse name: Not on file  . Number of children: Not on file  . Years of  education: Not on file  . Highest education level: Not on file  Occupational History  . Occupation: disabled  Social Needs  . Financial resource strain: Patient refused  . Food insecurity    Worry: Never true    Inability: Never true  . Transportation needs    Medical: No    Non-medical: No  Tobacco Use  . Smoking status: Never Smoker  . Smokeless tobacco: Never Used  Substance and Sexual Activity  . Alcohol use: Never    Frequency: Never  . Drug use: Never  . Sexual activity: Not on file  Lifestyle  . Physical activity    Days per week: 0 days    Minutes per session: 0 min  . Stress: Not on file  Relationships  . Social connections    Talks on phone: More than three times a week    Gets together: Never    Attends religious service: More than 4 times per year    Active member of club or organization: No    Attends meetings of clubs or organizations: Never    Relationship status: Married  . Intimate partner violence    Fear of current or ex partner: Not on file    Emotionally abused: Not on file    Physically abused: Not on file    Forced sexual activity: Not on file  Other Topics Concern  . Not on file  Social History Narrative  . Not on file     ROS- All systems are reviewed and negative except as per the HPI above.  Physical Exam: Vitals:   05/22/19 0844  BP: 112/74  Pulse: 82  Weight: 123.8 kg  Height: 6' (1.829 m)    GEN- The patient is well appearing obese male, alert and oriented x 3 today.   HEENT-head normocephalic, atraumatic, sclera clear, conjunctiva pink, hearing intact, trachea midline. Lungs- Clear to ausculation bilaterally, normal work of breathing Heart- Regular rate and rhythm, no murmurs, rubs or gallops  GI- soft, NT, ND, + BS Extremities- no clubbing, cyanosis, or edema MS- no significant deformity or atrophy Skin- no rash or lesion Psych- euthymic mood, full affect Neuro- strength and sensation are intact   Wt Readings from  Last 3 Encounters:  05/22/19 123.8 kg  05/15/19 123.4 kg  05/08/19 123.8 kg    EKG today demonstrates SR HR 82, PR 158, QRS 86, QTc 455  Echo 02/26/19 demonstrated  1. The left ventricle has normal systolic function with an ejection fraction of 60-65%. The cavity size  was normal. Left ventricular diastolic Doppler parameters are consistent with impaired relaxation.  2. The right ventricle has normal systolic function. The cavity was normal. There is no increase in right ventricular wall thickness.  3. Left atrial size was mildly dilated.  4. The aortic valve is tricuspid. Mild thickening of the aortic valve. Mild calcification of the aortic valve.  Epic records are reviewed at length today  Assessment and Plan:  1. Paroxysmal atrial fibrillation Patient converted to SR on amiodarone. ? 2/2 acute shigellosis infection.  Patient appears to be maintaining SR.  Patient will complete 30 day course of amiodarone tomorrow. Will discontinue at that time. Can consider long term AAD should he have reoccurrence of his afib.  Continue warfarin  This patients CHA2DS2-VASc Score and unadjusted Ischemic Stroke Rate (% per year) is equal to 3.2 % stroke rate/year from a score of 3  Above score calculated as 1 point each if present [CHF, HTN, DM, Vascular=MI/PAD/Aortic Plaque, Age if 65-74, or Male] Above score calculated as 2 points each if present [Age > 75, or Stroke/TIA/TE]   2. Obesity Body mass index is 37.03 kg/m. Lifestyle modification was discussed and encouraged including regular physical activity and weight reduction. Patient doing well, he has lost ~60 lbs.  3. Obstructive sleep apnea The importance of adequate treatment of sleep apnea was discussed today in order to improve our ability to maintain sinus rhythm long term. Compliant with CPAP therapy.   4. ESRD on HD T/Th/Sat. Followed by nephrology. S/p fistula 05/15/19.  5. HTN Stable, no changes today.    Follow up with Dr  Margaretann Loveless in 3 months.    [facility] [address] [place], [place] [address] [phone] 05/22/2019 8:58 AM

## 2019-05-24 ENCOUNTER — Ambulatory Visit (INDEPENDENT_AMBULATORY_CARE_PROVIDER_SITE_OTHER): Payer: PRIVATE HEALTH INSURANCE | Admitting: *Deleted

## 2019-05-24 ENCOUNTER — Other Ambulatory Visit: Payer: Self-pay

## 2019-05-24 DIAGNOSIS — I4891 Unspecified atrial fibrillation: Secondary | ICD-10-CM

## 2019-05-24 DIAGNOSIS — Z7901 Long term (current) use of anticoagulants: Secondary | ICD-10-CM | POA: Diagnosis not present

## 2019-05-24 LAB — POCT INR: INR: 1.4 — AB (ref 2.0–3.0)

## 2019-05-24 NOTE — Patient Instructions (Addendum)
Description    Take an extra 1/2 tablet today and tomorrow, then continue taking 3mg  (1 tablet) daily. Recheck in 1 week. Stopped amio on 9/24,  Call [facility] with new meds, questions or concerns [phone].

## 2019-05-28 ENCOUNTER — Ambulatory Visit (INDEPENDENT_AMBULATORY_CARE_PROVIDER_SITE_OTHER): Payer: PRIVATE HEALTH INSURANCE | Admitting: Adult Health

## 2019-05-28 ENCOUNTER — Encounter: Payer: Self-pay | Admitting: Adult Health

## 2019-05-28 ENCOUNTER — Other Ambulatory Visit: Payer: Self-pay

## 2019-05-28 VITALS — BP 104/71 | HR 98 | Temp 99.0°F | Ht 73.0 in | Wt 273.2 lb

## 2019-05-28 DIAGNOSIS — E669 Obesity, unspecified: Secondary | ICD-10-CM

## 2019-05-28 DIAGNOSIS — E119 Type 2 diabetes mellitus without complications: Secondary | ICD-10-CM | POA: Diagnosis not present

## 2019-05-28 DIAGNOSIS — I480 Paroxysmal atrial fibrillation: Secondary | ICD-10-CM

## 2019-05-28 DIAGNOSIS — Z0000 Encounter for general adult medical examination without abnormal findings: Secondary | ICD-10-CM | POA: Diagnosis not present

## 2019-05-28 DIAGNOSIS — N186 End stage renal disease: Secondary | ICD-10-CM

## 2019-05-28 DIAGNOSIS — I10 Essential (primary) hypertension: Secondary | ICD-10-CM | POA: Diagnosis not present

## 2019-05-28 DIAGNOSIS — E1169 Type 2 diabetes mellitus with other specified complication: Secondary | ICD-10-CM | POA: Diagnosis not present

## 2019-05-28 DIAGNOSIS — Z992 Dependence on renal dialysis: Secondary | ICD-10-CM

## 2019-05-28 LAB — POCT GLYCOSYLATED HEMOGLOBIN (HGB A1C): Hemoglobin A1C: 5.1 % (ref 4.0–5.6)

## 2019-05-28 LAB — POCT UA - MICROALBUMIN
Albumin/Creatinine Ratio, Urine, POC: >300
Creatinine, POC: 100 mg/dL
Microalbumin Ur, POC: 150 mg/L

## 2019-05-28 NOTE — Assessment & Plan Note (Signed)
He has not used Metoprolol >4 months- not on any anti-hypertensives He has not used any Nitro He denies any cardiac sx's

## 2019-05-28 NOTE — Patient Instructions (Addendum)
Diabetes Mellitus and Nutrition, Adult When you have diabetes (diabetes mellitus), it is very important to have healthy eating habits because your blood sugar (glucose) levels are greatly affected by what you eat and drink. Eating healthy foods in the appropriate amounts, at about the same times every day, can help you:  Control your blood glucose.  Lower your risk of heart disease.  Improve your blood pressure.  Reach or maintain a healthy weight. Every person with diabetes is different, and each person has different needs for a meal plan. Your health care provider may recommend that you work with a diet and nutrition specialist (dietitian) to make a meal plan that is best for you. Your meal plan may vary depending on factors such as:  The calories you need.  The medicines you take.  Your weight.  Your blood glucose, blood pressure, and cholesterol levels.  Your activity level.  Other health conditions you have, such as heart or kidney disease. How do carbohydrates affect me? Carbohydrates, also called carbs, affect your blood glucose level more than any other type of food. Eating carbs naturally raises the amount of glucose in your blood. Carb counting is a method for keeping track of how many carbs you eat. Counting carbs is important to keep your blood glucose at a healthy level, especially if you use insulin or take certain oral diabetes medicines. It is important to know how many carbs you can safely have in each meal. This is different for every person. Your dietitian can help you calculate how many carbs you should have at each meal and for each snack. Foods that contain carbs include:  Bread, cereal, rice, pasta, and crackers.  Potatoes and corn.  Peas, beans, and lentils.  Milk and yogurt.  Fruit and juice.  Desserts, such as cakes, cookies, ice cream, and candy. How does alcohol affect me? Alcohol can cause a sudden decrease in blood glucose (hypoglycemia),  especially if you use insulin or take certain oral diabetes medicines. Hypoglycemia can be a life-threatening condition. Symptoms of hypoglycemia (sleepiness, dizziness, and confusion) are similar to symptoms of having too much alcohol. If your health care provider says that alcohol is safe for you, follow these guidelines:  Limit alcohol intake to no more than 1 drink per day for nonpregnant women and 2 drinks per day for men. One drink equals 12 oz of beer, 5 oz of wine, or 1 oz of hard liquor.  Do not drink on an empty stomach.  Keep yourself hydrated with water, diet soda, or unsweetened iced tea.  Keep in mind that regular soda, juice, and other mixers may contain a lot of sugar and must be counted as carbs. What are tips for following this plan?  Reading food labels  Start by checking the serving size on the "Nutrition Facts" label of packaged foods and drinks. The amount of calories, carbs, fats, and other nutrients listed on the label is based on one serving of the item. Many items contain more than one serving per package.  Check the total grams (g) of carbs in one serving. You can calculate the number of servings of carbs in one serving by dividing the total carbs by 15. For example, if a food has 30 g of total carbs, it would be equal to 2 servings of carbs.  Check the number of grams (g) of saturated and trans fats in one serving. Choose foods that have low or no amount of these fats.  Check the number of  milligrams (mg) of salt (sodium) in one serving. Most people should limit total sodium intake to less than 2,300 mg per day.  Always check the nutrition information of foods labeled as "low-fat" or "nonfat". These foods may be higher in added sugar or refined carbs and should be avoided.  Talk to your dietitian to identify your daily goals for nutrients listed on the label. Shopping  Avoid buying canned, premade, or processed foods. These foods tend to be high in fat, sodium,  and added sugar.  Shop around the outside edge of the grocery store. This includes fresh fruits and vegetables, bulk grains, fresh meats, and fresh dairy. Cooking  Use low-heat cooking methods, such as baking, instead of high-heat cooking methods like deep frying.  Cook using healthy oils, such as olive, canola, or sunflower oil.  Avoid cooking with butter, cream, or high-fat meats. Meal planning  Eat meals and snacks regularly, preferably at the same times every day. Avoid going long periods of time without eating.  Eat foods high in fiber, such as fresh fruits, vegetables, beans, and whole grains. Talk to your dietitian about how many servings of carbs you can eat at each meal.  Eat 4-6 ounces (oz) of lean protein each day, such as lean meat, chicken, fish, eggs, or tofu. One oz of lean protein is equal to: ? 1 oz of meat, chicken, or fish. ? 1 egg. ?  cup of tofu.  Eat some foods each day that contain healthy fats, such as avocado, nuts, seeds, and fish. Lifestyle  Check your blood glucose regularly.  Exercise regularly as told by your health care provider. This may include: ? 150 minutes of moderate-intensity or vigorous-intensity exercise each week. This could be brisk walking, biking, or water aerobics. ? Stretching and doing strength exercises, such as yoga or weightlifting, at least 2 times a week.  Take medicines as told by your health care provider.  Do not use any products that contain nicotine or tobacco, such as cigarettes and e-cigarettes. If you need help quitting, ask your health care provider.  Work with a Social worker or diabetes educator to identify strategies to manage stress and any emotional and social challenges. Questions to ask a health care provider  Do I need to meet with a diabetes educator?  Do I need to meet with a dietitian?  What number can I call if I have questions?  When are the best times to check my blood glucose? Where to find more  information:  American Diabetes Association: diabetes.org  Academy of Nutrition and Dietetics: www.eatright.[employer] of Diabetes and Digestive and Kidney Diseases (NIH): [employer] Summary  A healthy meal plan will help you control your blood glucose and maintain a healthy lifestyle.  Working with a diet and nutrition specialist (dietitian) can help you make a meal plan that is best for you.  Keep in mind that carbohydrates (carbs) and alcohol have immediate effects on your blood glucose levels. It is important to count carbs and to use alcohol carefully. This information is not intended to replace advice given to you by your health care provider. Make sure you discuss any questions you have with your health care provider. Document Released: 05/12/2005 Document Revised: 07/28/2017 Document Reviewed: 09/19/2016 Elsevier Patient Education  2020 [place] all medications as directed, with one change- Reduce Lantus from 24 to 18 units once daily. Continue to check your blood glucose as you have been and follow-up your Endocrinologist/Dr. Cruzita Lederer in the ASAP.  Continue close follow-up with your other various specialist's. Continue to social distance and wear a mask when in public. Follow-up here in 3 months, sooner if needed. GREAT TO SEE YOU!

## 2019-05-28 NOTE — Assessment & Plan Note (Signed)
Lab Results  Component Value Date   HGBA1C 5.1 05/28/2019   HGBA1C 7.4 (A) 02/11/2019   HGBA1C 14.9 (H) 12/19/2018  Last contact with Endo/Dr. Cruzita Lederer- via telephone 03/08/2019, last OV 02/11/2019- was instructed to f/u in 3 months He provided BG log- AM BG 88-96, Prior to lunch 73-102, Prior to dinner 73-125 He is currently taking Lantus 24 U QS- advised to reduce to 18 U and follow up with Endo immediately

## 2019-05-28 NOTE — Progress Notes (Signed)
Subjective:    [name]., male    DOB: 11-19-1955, 63 y.o.   MRN: [id number]  HPI: 01/28/2019 OV: Mr. Shin is here to establish as a new pt. He is a pleasant 63 year old male. PMH: HTN, Obesity, OSA, Renal Cell Ca, S/P Nephrectomy 2019,CKD IV with fistula (left radiocephalic AV fistula on 05/05/7413) , and new onset diabetes A1c >14 at last hospital admission April 2020. He has been using various urgent cares for primary care needs and frequent ED visits with subsequent hospital admissions. He is followed by Dr. Upton/Nephrology-advised to follow up with 2-3 weeks and have the following labs: CBC, BMP, Intact PTH Echocardiogramrecommendedfor nonsustained asymptomatic VT noted incidentally on Tele in the hospital He was started on Lantus and Humolog Sliding Scale, provided detailed instructions on how to manage diabetes  Pancreatic neck mass The patient had a1.1cm indeterminate pancreatic mass noted incidentally on MRI abdomen from 02/2018 in setting of RCC.  At that time was recommendedto have a3-6 monthintervalrepeat noncon MRI- never completed CT WO completed during April 2020 hospitalization  IMPRESSION: Hepatic steatosis. Left nephrolithiasis. Mild left hydronephrosis is noted without evidence of obstructing calculus. Status post right nephrectomy. Diverticulosis of descending and sigmoid colon without inflammation.  Recommendation to repeat MRI abdomen WO in 3-6 months  Recent Labs       Lab Results  Component Value Date   HGBA1C 14.9 (H) 12/19/2018     Mr. Wandel has been seen by his Nephrologist/Dr. Hollie Salk, labs completed. He was seen by his previous PCP, labs completed, but due to lapse in time since last OV- he was told he would have to re-establish as a new pt. He reports medication compliance, denies SE He has not used Metoprolol tartrate 68m BID in months due to SBP 110, his Nephrologist instructed him to hold BB if SBP  <130 He has been using Lantus and Humalog per Dr. DSabino Niemannclear instructions- BG has been steadily trending down, with BG in 80s the last two weeks- will decrease injectable therapy. He reports following Diabetic diet, walking frequently, and even doing yard work- as tolerated. He reports vision change, specifically blurred vision. He states "it has been years since I have had my eyes looked at". Recommend diabetic eye examination   02/25/2019 OV: Ms. RGobinis here f/u: T2D, Obesity, HTN, OSA,   Renal Cell Ca, S/P Nephrectomy 2019  He established with Endocrinologist 02/11/2019: anti-diabetic medications adjusted: Please continue: Metformin 1000 mg 2x a day, with meals- HE IS NOT ON METFORMIN Please decrease:  Lantus 32 units once a day at bedtime Novolog 6-8 units 3x a day, before meal Stop granola Please let me know if the sugars are consistently <80 or >200.  02/25/2019 OV: Mr. RReasreports medication compliance. Per his BG log- he is having readings- 70-120s Advised him to call his Endocrinologist ASAP. He continues to slowly loss wt, current wt 301, 5 lbs wt loss since initial OV 01/28/2019 He has been following a diabetic diet, reports poor appetite. Discussed the importance of eating after taking the Humalog injection, he cannot skip meals after humalog injection. He reports fatigue that has been steadily worsening since Fall 2020. Echocardiogram ordered 01/28/2019- yet to be completed. He reports being able to climb the 14 stairs in his home with and without difficulty, "depending on the day". He denies CP/palpitations. He reports having several treadmill tests >15 years ago. He reports urinating several times/day He reports difficulty with accepting that he cannot  work- he is awaiting disability to be awarded. He will f/u with Nephrologist 15 March 2019. He denies any complications with L AV fistula  05/28/2019 OV: Mr. Rathbone is here for 3 month f/u-HTN, Obesity,  OSA, Renal Cell Ca, S/P Nephrectomy 2019,CKD IV with fistula  He has had multiple hospitalizations for GI upset (diarrhea/food poisoning), Precordial Pain, DX'd WITH ATRIAL FIB 04/22/2019, started on anti-coag, now established with Cardiology, Atrial Fib Clinic, and Coumadin Clinic. He has not followed up with primary care as directed after various hospitalizations- today is first contact Echo 02/26/19 showed normal EF 60-65% with mild LAE 05/22/2019 A Fib Clinic- He is to stop Amiodarone 05/23/2019  Last contact with Endo/Dr. Cruzita Lederer- via telephone 03/08/2019, last OV 02/11/2019- was instructed to f/u in 3 months He provided BG log- AM BG 88-96, Prior to lunch 73-102, Prior to dinner 73-125 He is currently taking Lantus 24 U QS- advised to reduce to 18 U and follow up with Endo immediately   Lab Results  Component Value Date   HGBA1C 5.1 05/28/2019   HGBA1C 7.4 (A) 02/11/2019   HGBA1C 14.9 (H) 12/19/2018   He has not used Metoprolol >4 months- not on any anti-hypertensives He has not used any Nitro He denies any cardiac sx's  He has HD Tuesday, Thursday, Sat- on Kidney transplant list  He was awarded disability finally   He states, "I am still adjusting to all this medical stuff"  Patient Care Team    Relationship Specialty Notifications Start End  Mina Marble D, NP PCP - General Family Medicine  01/16/19   Elouise Munroe, MD PCP - Cardiology Cardiology  03/06/19     Patient Active Problem List   Diagnosis Date Noted  . Atrial fibrillation ([place]) 04/26/2019  . Long term (current) use of anticoagulants 04/26/2019  . Diarrhea 04/20/2019  . Near syncope 04/20/2019  . NSVT (nonsustained ventricular tachycardia) ([place]) 04/20/2019  . ESRD on hemodialysis ([place]) 04/20/2019  . Chest pain 03/06/2019  . Diabetes mellitus type 2 in obese ([place]) 03/06/2019  . Obesity (BMI 30-39.9) 03/06/2019  . Healthcare maintenance 01/28/2019  . DKA (diabetic ketoacidoses) ([place]) 12/19/2018  . Newly  diagnosed diabetes ([place]) 12/19/2018  . Syncope 06/12/2018  . Ileus ([place])   . SVT (supraventricular tachycardia) ([place])   . Renal mass, right 05/16/2018  . Mass of pancreas   . Abnormal CT of the abdomen   . Bacteremia due to Enterococcus 04/23/2018  . Sepsis ([place]) 04/19/2018  . Acute renal failure superimposed on stage 4 chronic kidney disease ([place]) 04/19/2018  . Dehydration 04/19/2018  . UTI (urinary tract infection): Probable 04/19/2018  . Leukocytosis 04/19/2018  . Anemia 04/19/2018  . Renal cell carcinoma, right ([place]) 04/19/2018  . S/P ureteral stent placement: 04/16/2018 04/19/2018  . GERD (gastroesophageal reflux disease) 04/19/2018  . OSA on CPAP 04/19/2018  . HTN (hypertension) 04/19/2018     Past Medical History:  Diagnosis Date  . Anemia   . Arthritis    knees  . Atrial fibrillation ([place])   . Bilateral renal cysts 03/23/2018   Noted MRI ABD  . [facility])    right kidney cancer  . Cholelithiasis 03/19/2018   noted on CT AB/Pelvis  . CKD (chronic kidney disease), stage IV [facility])    nephrologist-- dr Hollie Salk [name]);  05-01-2018 has not started dialysis, scheduled for AV fistula creation 05-07-2018  . Diabetes mellitus without complication ([place])   . Diverticulosis of colon 04/19/2018   Noted on CT abd/pelvis  .  GERD (gastroesophageal reflux disease)   . Hepatic steatosis 03/19/2018   Mild diffuse, noted on CT AB/Pelvis  . History of kidney stones   . History of pulmonary embolus (PE) 03/2008   treated with coumadin for 6 months  . History of sepsis 04/20/2018   per discharge note , probable UTI  . Hypertension   . IBS (irritable bowel syndrome)   . Nocturia   . OSA on CPAP   . Pancreatic lesion    0.4cm cystic per CT 07/ 2019  . Pneumonia   . Pulmonary nodule    Solid 69m Right Lower Lobe  . Right renal mass 04/10/2018   new dx--  scheduled for nephrectomy 05-16-2018  . S/P ureteral stent placement: 04/16/2018 04/19/2018     Past Surgical  History:  Procedure Laterality Date  . AV FISTULA PLACEMENT Left 05/07/2018   Procedure: Creation of Left arm Radiocephalic Fistula;  Surgeon: CMarty Heck MD;  [place]  Service: Vascular;  Laterality: Left;  . AV FISTULA PLACEMENT Left 05/15/2019   Procedure: Creation of Brachiocephalic fistula left arm;  Surgeon: CWaynetta Sandy MD;  [place] [facility]  Service: Vascular;  Laterality: Left;  . CYSTOSCOPY/RETROGRADE/URETEROSCOPY/STONE EXTRACTION WITH BASKET  x2 last one 1990s approx.  . CYSTOSCOPY/URETEROSCOPY/HOLMIUM LASER/STENT PLACEMENT Left 04/16/2018   Procedure: CYSTOSCOPY/LEFT URETEROSCOPY/LEFT RETROGRADE/STENT PLACEMENT;  Surgeon: BLucas Mallow MD;  Location: [facility];  Service: Urology;  Laterality: Left;  . EUS N/A 05/03/2018   Procedure: UPPER ENDOSCOPIC ULTRASOUND (EUS) RADIAL;  Surgeon: JMilus Banister MD;  [facility];  Service: Gastroenterology;  Laterality: N/A;  . EUS N/A 05/03/2018   Procedure: UPPER ENDOSCOPIC ULTRASOUND (EUS) LINEAR;  Surgeon: JMilus Banister MD;  [facility];  Service: Gastroenterology;  Laterality: N/A;  . EXTRACORPOREAL SHOCK WAVE LITHOTRIPSY  x3  last one 2004 approx.  .Marland KitchenFINE NEEDLE ASPIRATION N/A 05/03/2018   Procedure: FINE NEEDLE ASPIRATION (FNA) LINEAR;  Surgeon: JMilus Banister MD;  [facility];  Service: Gastroenterology;  Laterality: N/A;  . LAPAROSCOPIC NEPHRECTOMY, HAND ASSISTED Right 05/16/2018   Procedure: HAND ASSISTED LAPAROSCOPIC RIGHT NEPHRECTOMY;  Surgeon: BLucas Mallow MD;  Location: [facility];  Service: Urology;  Laterality: Right;  . left index finger attachment  1980s   3-4 surgeries  . TOE SURGERY  1980s   in beween 2nd and 3rd toes cyst removed     Family History  Problem Relation Age of Onset  . Alzheimer's disease Mother   . Alzheimer's disease Father   . Heart disease Father   . Heart attack Father   . Cancer - Other Sister      Social History    Substance and Sexual Activity  Drug Use Never     Social History   Substance and Sexual Activity  Alcohol Use Never  . Frequency: Never     Social History   Tobacco Use  Smoking Status Never Smoker  Smokeless Tobacco Never Used     Outpatient Encounter Medications as of 05/28/2019  Medication Sig Note  . acetaminophen (TYLENOL) 500 MG tablet Take 1,000 mg by mouth every 6 (six) hours as needed for mild pain or headache.    . allopurinol (ZYLOPRIM) 100 MG tablet Take 1 tablet (100 mg total) by mouth every evening.   .Marland Kitchenaspirin EC 81 MG EC tablet Take 1 tablet (81 mg total) by mouth daily. (Patient taking differently: Take 81 mg by mouth every evening. )   . atorvastatin (LIPITOR)  40 MG tablet Take 1 tablet (40 mg total) by mouth daily. (Patient taking differently: Take 40 mg by mouth every evening. )   . blood glucose meter kit and supplies Dispense based on patient and insurance preference. Use up to four times daily as directed. (FOR ICD-10 E10.9, E11.9).   Marland Kitchen diphenhydramine-acetaminophen (TYLENOL PM) 25-500 MG TABS tablet Take 2 tablets by mouth at bedtime as needed (sleep).   . fluticasone (FLONASE) 50 MCG/ACT nasal spray Place 1 spray into both nostrils daily as needed for allergies or rhinitis.   Marland Kitchen LANTUS SOLOSTAR 100 UNIT/ML Solostar Pen Inject 18 Units into the skin at bedtime. 05/15/2019: 13u 05/14/19  . loratadine (CLARITIN) 10 MG tablet Take 10 mg by mouth every evening.    . metoprolol tartrate (LOPRESSOR) 25 MG tablet Take 25 mg by mouth daily as needed (for elevated blood pressure (greater than 130)).   Marland Kitchen nitroGLYCERIN (NITROSTAT) 0.4 MG SL tablet Place 1 tablet (0.4 mg total) under the tongue every 5 (five) minutes as needed for chest pain. 05/09/2019: On hand (Never used)  . omeprazole (PRILOSEC OTC) 20 MG tablet Take 40 mg by mouth daily before breakfast.    . tamsulosin (FLOMAX) 0.4 MG CAPS capsule Take 0.4 mg by mouth every evening.    . warfarin (COUMADIN) 3 MG  tablet Take as directed by Coumadin Clinic (Patient taking differently: Take 3 mg by mouth every evening. Take as directed by Coumadin Clinic)   . [DISCONTINUED] amiodarone (PACERONE) 400 MG tablet Take 0.5 tablets (200 mg total) by mouth 2 (two) times daily for 7 days, THEN 0.5 tablets (200 mg total) 2 (two) times daily for 23 days. (Patient taking differently: Take 0.5 tablet (200 mg) by mouth once daily in the afternoon.)    No facility-administered encounter medications on file as of 05/28/2019.     Allergies: Penicillins  Body mass index is 36.04 kg/m.  Blood pressure 104/71, pulse 98, temperature 99 F (37.2 C), temperature source Oral, height '6\' 1"'  (1.854 m), weight 273 lb 3.2 oz (123.9 kg), SpO2 100 %.  Review of Systems  Constitutional: Positive for fatigue and unexpected weight change. Negative for activity change, appetite change, chills, diaphoresis and fever.  Eyes: Negative for visual disturbance.  Respiratory: Negative for cough, chest tightness, shortness of breath, wheezing and stridor.   Gastrointestinal: Negative for abdominal distention, abdominal pain, blood in stool, constipation, diarrhea, nausea and rectal pain.  Endocrine: Negative for cold intolerance, heat intolerance, polydipsia, polyphagia and polyuria.  Genitourinary:       HD  Musculoskeletal: Negative for arthralgias, back pain, gait problem, joint swelling, myalgias, neck pain and neck stiffness.  Skin: Negative for color change, pallor, rash and wound.  Neurological: Negative for dizziness and headaches.  Hematological: Negative for adenopathy. Does not bruise/bleed easily.  Psychiatric/Behavioral: Negative for agitation, behavioral problems, confusion, decreased concentration, dysphoric mood, hallucinations, self-injury, sleep disturbance and suicidal ideas. The patient is not nervous/anxious and is not hyperactive.        Objective:   Physical Exam Vitals signs and nursing note reviewed.   Constitutional:      General: He is not in acute distress.    Appearance: He is obese. He is not ill-appearing, toxic-appearing or diaphoretic.  HENT:     Head: Normocephalic and atraumatic.  Eyes:     Extraocular Movements: Extraocular movements intact.     Conjunctiva/sclera: Conjunctivae normal.     Pupils: Pupils are equal, round, and reactive to light.  Cardiovascular:  Rate and Rhythm: Normal rate and regular rhythm.     Pulses: Normal pulses.     Heart sounds: Normal heart sounds. No murmur. No friction rub. No gallop.   Pulmonary:     Effort: Pulmonary effort is normal. No respiratory distress.     Breath sounds: Normal breath sounds. No stridor. No wheezing, rhonchi or rales.  Chest:     Chest wall: No tenderness.  Musculoskeletal:     Comments: LUE- AV fistula: + thrill/bruit  Skin:    General: Skin is warm and dry.     Capillary Refill: Capillary refill takes less than 2 seconds.  Neurological:     Mental Status: He is alert and oriented to person, place, and time.  Psychiatric:        Mood and Affect: Mood normal.        Behavior: Behavior normal.        Thought Content: Thought content normal.        Judgment: Judgment normal.       Assessment & Plan:   1. Newly diagnosed diabetes ([place])   2. Healthcare maintenance   3. Essential hypertension   4. Diabetes mellitus type 2 in obese ([facility])   5. Paroxysmal atrial fibrillation ([place])   6. ESRD on hemodialysis [facility])     Healthcare maintenance Continue all medications as directed, with one change- Reduce Lantus from 24 to 18 units once daily. Continue to check your blood glucose as you have been and follow-up your Endocrinologist/Dr. Cruzita Lederer in the ASAP. Continue close follow-up with your other various specialist's. Continue to social distance and wear a mask when in public. Follow-up here in 3 months, sooner if needed.  HTN (hypertension) He has not used Metoprolol >4 months- not on any  anti-hypertensives He has not used any Nitro He denies any cardiac sx's  Diabetes mellitus type 2 in obese [facility]) Lab Results  Component Value Date   HGBA1C 5.1 05/28/2019   HGBA1C 7.4 (A) 02/11/2019   HGBA1C 14.9 (H) 12/19/2018  Last contact with Endo/Dr. Cruzita Lederer- via telephone 03/08/2019, last OV 02/11/2019- was instructed to f/u in 3 months He provided BG log- AM BG 88-96, Prior to lunch 73-102, Prior to dinner 73-125 He is currently taking Lantus 24 U QS- advised to reduce to 18 U and follow up with Endo immediately   Atrial fibrillation ([place]) DX'd WITH ATRIAL FIB 04/22/2019, started on anti-coag, now established with Cardiology, Atrial Fib Clinic, and Coumadin Clinic. He has not followed up with primary care as directed after various hospitalizations- today is first contact Echo 02/26/19 showed normal EF 60-65% with mild LAE 05/22/2019 A Fib Clinic- He is to stop Amiodarone 05/23/2019  ESRD on hemodialysis ([place]) Tues, Thurs, SAt Currently on Transplant List    FOLLOW-UP:  Return in about 3 months (around 08/27/2019) for Regular Follow Up, Diabetes, Obesity.

## 2019-05-28 NOTE — Assessment & Plan Note (Signed)
Tues, Thurs, SAt Currently on Transplant List

## 2019-05-28 NOTE — Assessment & Plan Note (Signed)
Continue all medications as directed, with one change- Reduce Lantus from 24 to 18 units once daily. Continue to check your blood glucose as you have been and follow-up your Endocrinologist/Dr. Cruzita Lederer in the ASAP. Continue close follow-up with your other various specialist's. Continue to social distance and wear a mask when in public. Follow-up here in 3 months, sooner if needed.

## 2019-05-28 NOTE — Assessment & Plan Note (Signed)
DX'd WITH ATRIAL FIB 04/22/2019, started on anti-coag, now established with Cardiology, Atrial Fib Clinic, and Coumadin Clinic. He has not followed up with primary care as directed after various hospitalizations- today is first contact Echo 02/26/19 showed normal EF 60-65% with mild LAE 05/22/2019 A Fib Clinic- He is to stop Amiodarone 05/23/2019

## 2019-05-29 LAB — MISCELLANEOUS TEST

## 2019-05-31 ENCOUNTER — Ambulatory Visit (INDEPENDENT_AMBULATORY_CARE_PROVIDER_SITE_OTHER): Payer: PRIVATE HEALTH INSURANCE | Admitting: *Deleted

## 2019-05-31 ENCOUNTER — Other Ambulatory Visit: Payer: Self-pay

## 2019-05-31 DIAGNOSIS — I4891 Unspecified atrial fibrillation: Secondary | ICD-10-CM | POA: Diagnosis not present

## 2019-05-31 DIAGNOSIS — Z7901 Long term (current) use of anticoagulants: Secondary | ICD-10-CM | POA: Diagnosis not present

## 2019-05-31 LAB — POCT INR: INR: 1.5 — AB (ref 2.0–3.0)

## 2019-05-31 NOTE — Patient Instructions (Addendum)
Description    Take an extra 1/2 tablet today and tomorrow, then start taking 3mg  (1 tablet) daily except for 4.5mg  (1.5 tablets) on Wednesday. Recheck in 10/13. Stopped amio on 9/24,  Call [facility] with new meds, questions or concerns [phone].

## 2019-06-11 ENCOUNTER — Ambulatory Visit (INDEPENDENT_AMBULATORY_CARE_PROVIDER_SITE_OTHER): Payer: PRIVATE HEALTH INSURANCE | Admitting: *Deleted

## 2019-06-11 ENCOUNTER — Other Ambulatory Visit: Payer: Self-pay

## 2019-06-11 DIAGNOSIS — I4891 Unspecified atrial fibrillation: Secondary | ICD-10-CM

## 2019-06-11 DIAGNOSIS — Z7901 Long term (current) use of anticoagulants: Secondary | ICD-10-CM | POA: Diagnosis not present

## 2019-06-11 LAB — POCT INR: INR: 1.4 — AB (ref 2.0–3.0)

## 2019-06-11 NOTE — Patient Instructions (Signed)
Increase dose to 1 1/2 tablets daily except 1 tablet on Sundays, Tuesdays and Thursdays. Recheck in 1 week. Stopped amio on 9/24,  Call [facility] clinic with new meds, questions or concerns [phone].

## 2019-06-12 ENCOUNTER — Encounter: Payer: Self-pay | Admitting: Pulmonary Disease

## 2019-06-12 ENCOUNTER — Telehealth: Payer: Self-pay | Admitting: Pulmonary Disease

## 2019-06-12 ENCOUNTER — Ambulatory Visit: Payer: PRIVATE HEALTH INSURANCE | Admitting: Pulmonary Disease

## 2019-06-12 VITALS — BP 122/84 | HR 82 | Temp 97.6°F | Ht 73.0 in | Wt 272.8 lb

## 2019-06-12 DIAGNOSIS — R918 Other nonspecific abnormal finding of lung field: Secondary | ICD-10-CM

## 2019-06-12 NOTE — Progress Notes (Deleted)
George Lewis    [id number]    [birth year]  Primary Care Physician:Danford, Berna Spare, NP  Referring Physician: Curt Bears, [place],  [place] [address]  Chief complaint:  ***  HPI:  ***  Pets: Occupation: Exposures: Smoking history: Travel history: Relevant family history:   Outpatient Encounter Medications as of 06/12/2019  Medication Sig  . acetaminophen (TYLENOL) 500 MG tablet Take 1,000 mg by mouth every 6 (six) hours as needed for mild pain or headache.   . allopurinol (ZYLOPRIM) 100 MG tablet Take 1 tablet (100 mg total) by mouth every evening.  Marland Kitchen aspirin EC 81 MG EC tablet Take 1 tablet (81 mg total) by mouth daily. (Patient taking differently: Take 81 mg by mouth every evening. )  . atorvastatin (LIPITOR) 40 MG tablet Take 1 tablet (40 mg total) by mouth daily. (Patient taking differently: Take 40 mg by mouth every evening. )  . blood glucose meter kit and supplies Dispense based on patient and insurance preference. Use up to four times daily as directed. (FOR ICD-10 E10.9, E11.9).  Marland Kitchen diphenhydramine-acetaminophen (TYLENOL PM) 25-500 MG TABS tablet Take 2 tablets by mouth at bedtime as needed (sleep).  . fluticasone (FLONASE) 50 MCG/ACT nasal spray Place 1 spray into both nostrils daily as needed for allergies or rhinitis.  Marland Kitchen LANTUS SOLOSTAR 100 UNIT/ML Solostar Pen Inject 13 Units into the skin at bedtime.   Marland Kitchen loratadine (CLARITIN) 10 MG tablet Take 10 mg by mouth every evening.   . metoprolol tartrate (LOPRESSOR) 25 MG tablet Take 25 mg by mouth daily as needed (for elevated blood pressure (greater than 130)).  Marland Kitchen nitroGLYCERIN (NITROSTAT) 0.4 MG SL tablet Place 1 tablet (0.4 mg total) under the tongue every 5 (five) minutes as needed for chest pain.  Marland Kitchen omeprazole (PRILOSEC OTC) 20 MG tablet Take 40 mg by mouth daily before breakfast.   . tamsulosin (FLOMAX) 0.4 MG CAPS capsule Take 0.4 mg by mouth every evening.   .  warfarin (COUMADIN) 3 MG tablet Take as directed by [facility] (Patient taking differently: Take 3 mg by mouth every evening. Take as directed by [facility])   No facility-administered encounter medications on file as of 06/12/2019.     Allergies as of 06/12/2019 - Review Complete 06/12/2019  Allergen Reaction Noted  . Penicillins Rash and Other (See Comments) 04/02/2018    Past Medical History:  Diagnosis Date  . Anemia   . Arthritis    knees  . Atrial fibrillation ([place])   . Bilateral renal cysts 03/23/2018   Noted MRI ABD  . Cancer [facility])    right kidney cancer  . Cholelithiasis 03/19/2018   noted on CT AB/Pelvis  . CKD (chronic kidney disease), stage IV [facility])    nephrologist-- dr Hollie Salk [name]);  05-01-2018 has not started dialysis, scheduled for AV fistula creation 05-07-2018  . Diabetes mellitus without complication ([place])   . Diverticulosis of colon 04/19/2018   Noted on CT abd/pelvis  . GERD (gastroesophageal reflux disease)   . Hepatic steatosis 03/19/2018   Mild diffuse, noted on CT AB/Pelvis  . History of kidney stones   . History of pulmonary embolus (PE) 03/2008   treated with coumadin for 6 months  . History of sepsis 04/20/2018   per discharge note , probable UTI  . Hypertension   . IBS (irritable bowel syndrome)   . Nocturia   . OSA on CPAP   .  Pancreatic lesion    0.4cm cystic per CT 07/ 2019  . Pneumonia   . Pulmonary nodule    Solid 20m Right Lower Lobe  . Right renal mass 04/10/2018   new dx--  scheduled for nephrectomy 05-16-2018  . S/P ureteral stent placement: 04/16/2018 04/19/2018    Past Surgical History:  Procedure Laterality Date  . AV FISTULA PLACEMENT Left 05/07/2018   Procedure: Creation of Left arm Radiocephalic Fistula;  Surgeon: CMarty Heck MD;  [facility] [facility]  Service: Vascular;  Laterality: Left;  . AV FISTULA PLACEMENT Left 05/15/2019   Procedure: Creation of Brachiocephalic fistula left arm;  Surgeon:  CWaynetta Sandy MD;  [place] [facility]  Service: Vascular;  Laterality: Left;  . CYSTOSCOPY/RETROGRADE/URETEROSCOPY/STONE EXTRACTION WITH BASKET  x2 last one 1990s approx.  . CYSTOSCOPY/URETEROSCOPY/HOLMIUM LASER/STENT PLACEMENT Left 04/16/2018   Procedure: CYSTOSCOPY/LEFT URETEROSCOPY/LEFT RETROGRADE/STENT PLACEMENT;  Surgeon: BLucas Mallow MD;  [facility];  Service: Urology;  Laterality: Left;  . EUS N/A 05/03/2018   Procedure: UPPER ENDOSCOPIC ULTRASOUND (EUS) RADIAL;  Surgeon: JMilus Banister MD;  [facility];  Service: Gastroenterology;  Laterality: N/A;  . EUS N/A 05/03/2018   Procedure: UPPER ENDOSCOPIC ULTRASOUND (EUS) LINEAR;  Surgeon: JMilus Banister MD;  [facility];  Service: Gastroenterology;  Laterality: N/A;  . EXTRACORPOREAL SHOCK WAVE LITHOTRIPSY  x3  last one 2004 approx.  .Marland KitchenFINE NEEDLE ASPIRATION N/A 05/03/2018   Procedure: FINE NEEDLE ASPIRATION (FNA) LINEAR;  Surgeon: JMilus Banister MD;  [facility];  Service: Gastroenterology;  Laterality: N/A;  . LAPAROSCOPIC NEPHRECTOMY, HAND ASSISTED Right 05/16/2018   Procedure: HAND ASSISTED LAPAROSCOPIC RIGHT NEPHRECTOMY;  Surgeon: BLucas Mallow MD;  [facility];  Service: Urology;  Laterality: Right;  . left index finger attachment  1980s   3-4 surgeries  . TOE SURGERY  1980s   in beween 2nd and 3rd toes cyst removed    Family History  Problem Relation Age of Onset  . Alzheimer's disease Mother   . Alzheimer's disease Father   . Heart disease Father   . Heart attack Father   . Cancer - Other Sister     Social History   Socioeconomic History  . Marital status: Married    Spouse name: Not on file  . Number of children: Not on file  . Years of education: Not on file  . Highest education level: Not on file  Occupational History  . Occupation: disabled  Social Needs  . Financial resource strain: Patient refused  . Food insecurity    Worry: Never true     Inability: Never true  . Transportation needs    Medical: No    Non-medical: No  Tobacco Use  . Smoking status: Never Smoker  . Smokeless tobacco: Never Used  Substance and Sexual Activity  . Alcohol use: Never    Frequency: Never  . Drug use: Never  . Sexual activity: Not on file  Lifestyle  . Physical activity    Days per week: 0 days    Minutes per session: 0 min  . Stress: Not on file  Relationships  . Social connections    Talks on phone: More than three times a week    Gets together: Never    Attends religious service: More than 4 times per year    Active member of club or organization: No    Attends meetings of clubs or organizations: Never    Relationship status: Married  .  Intimate partner violence    Fear of current or ex partner: Not on file    Emotionally abused: Not on file    Physically abused: Not on file    Forced sexual activity: Not on file  Other Topics Concern  . Not on file  Social History Narrative  . Not on file    Review of systems: Review of Systems  Constitutional: Negative for fever and chills.  HENT: Negative.   Eyes: Negative for blurred vision.  Respiratory: as per HPI  Cardiovascular: Negative for chest pain and palpitations.  Gastrointestinal: Negative for vomiting, diarrhea, blood per rectum. Genitourinary: Negative for dysuria, urgency, frequency and hematuria.  Musculoskeletal: Negative for myalgias, back pain and joint pain.  Skin: Negative for itching and rash.  Neurological: Negative for dizziness, tremors, focal weakness, seizures and loss of consciousness.  Endo/Heme/Allergies: Negative for environmental allergies.  Psychiatric/Behavioral: Negative for depression, suicidal ideas and hallucinations.  All other systems reviewed and are negative.  Physical Exam: Blood pressure 122/84, pulse 82, temperature 97.6 F (36.4 C), temperature source Temporal, height _0  (1.854 m), weight 272 lb 12.8 oz (123.7 kg), SpO2 97 %. Gen:       No acute distress HEENT:  EOMI, sclera anicteric Neck:     No masses; no thyromegaly Lungs:    Clear to auscultation bilaterally; normal respiratory effort*** CV:         Regular rate and rhythm; no murmurs Abd:      + bowel sounds; soft, non-tender; no palpable masses, no distension Ext:    No edema; adequate peripheral perfusion Skin:      Warm and dry; no rash Neuro: alert and oriented x 3 Psych: normal mood and affect  Data Reviewed: Imaging:  PFTs:  Labs:  Assessment:  ***  Plan/Recommendations: ***  Marshell Garfinkel MD [facility] 06/12/2019, 10:59 AM  CC: Curt Bears, MD

## 2019-06-12 NOTE — Progress Notes (Signed)
George Lewis    [id number]    Sep 16, 1955  Primary Care Physician:Danford, Berna Spare, NP  Referring Physician: Curt Bears, [place],  [place] [address]  Chief complaint: Consult for lung nodule  HPI: 63 year old with history of kidney cancer, end-stage renal disease on hemodialysis, remote pulmonary embolism, atrial fibrillation, sleep apnea Referred for evaluation of pulmonary nodule  We do not have any records or imaging at hand but as per patient he has had CT of the kidney at his urologist office which shows enlarging lung nodules.  History notable for right renal cancer status post nephrectomy in September 2019.  He has end-stage renal disease starting dialysis July 2020.  History of pulmonary embolism around 2005 for which he was treated with 6 months of anticoagulation.  More recently had been placed on amiodarone and Coumadin anticoagulation for atrial fibrillation.  He was taken off amiodarone in sept 2020 as he was in sinus rhythm but is still on the Coumadin. Has history of sleep apnea and on CPAP for at least 15 to 20 years.  Complains of cough, dyspnea, fevers, chills  Pets: Has a dog.  No birds, farm Occupation: Retired Conservation officer, nature Exposures: May have exposure to asbestos and mold while he was working.  He has been on disability for the past 16 years Smoking history: Never smoker Travel history: No significant travel Relevant family history: No significant family history of lung disease.  Outpatient Encounter Medications as of 06/12/2019  Medication Sig  . acetaminophen (TYLENOL) 500 MG tablet Take 1,000 mg by mouth every 6 (six) hours as needed for mild pain or headache.   . allopurinol (ZYLOPRIM) 100 MG tablet Take 1 tablet (100 mg total) by mouth every evening.  Marland Kitchen aspirin EC 81 MG EC tablet Take 1 tablet (81 mg total) by mouth daily. (Patient taking differently: Take 81 mg by mouth every evening. )  .  atorvastatin (LIPITOR) 40 MG tablet Take 1 tablet (40 mg total) by mouth daily. (Patient taking differently: Take 40 mg by mouth every evening. )  . blood glucose meter kit and supplies Dispense based on patient and insurance preference. Use up to four times daily as directed. (FOR ICD-10 E10.9, E11.9).  Marland Kitchen diphenhydramine-acetaminophen (TYLENOL PM) 25-500 MG TABS tablet Take 2 tablets by mouth at bedtime as needed (sleep).  . fluticasone (FLONASE) 50 MCG/ACT nasal spray Place 1 spray into both nostrils daily as needed for allergies or rhinitis.  Marland Kitchen LANTUS SOLOSTAR 100 UNIT/ML Solostar Pen Inject 13 Units into the skin at bedtime.   Marland Kitchen loratadine (CLARITIN) 10 MG tablet Take 10 mg by mouth every evening.   . metoprolol tartrate (LOPRESSOR) 25 MG tablet Take 25 mg by mouth daily as needed (for elevated blood pressure (greater than 130)).  Marland Kitchen nitroGLYCERIN (NITROSTAT) 0.4 MG SL tablet Place 1 tablet (0.4 mg total) under the tongue every 5 (five) minutes as needed for chest pain.  Marland Kitchen omeprazole (PRILOSEC OTC) 20 MG tablet Take 40 mg by mouth daily before breakfast.   . tamsulosin (FLOMAX) 0.4 MG CAPS capsule Take 0.4 mg by mouth every evening.   . warfarin (COUMADIN) 3 MG tablet Take as directed by [facility] (Patient taking differently: Take 3 mg by mouth every evening. Take as directed by [facility])   No facility-administered encounter medications on file as of 06/12/2019.     Allergies as of 06/12/2019 - Review Complete 06/12/2019  Allergen  Reaction Noted  . Penicillins Rash and Other (See Comments) 04/02/2018    Past Medical History:  Diagnosis Date  . Anemia   . Arthritis    knees  . Atrial fibrillation ([place])   . Bilateral renal cysts 03/23/2018   Noted MRI ABD  . Cancer [facility])    right kidney cancer  . Cholelithiasis 03/19/2018   noted on CT AB/Pelvis  . CKD (chronic kidney disease), stage IV [facility])    nephrologist-- dr Hollie Salk [name]);  05-01-2018 has not started  dialysis, scheduled for AV fistula creation 05-07-2018  . Diabetes mellitus without complication ([place])   . Diverticulosis of colon 04/19/2018   Noted on CT abd/pelvis  . GERD (gastroesophageal reflux disease)   . Hepatic steatosis 03/19/2018   Mild diffuse, noted on CT AB/Pelvis  . History of kidney stones   . History of pulmonary embolus (PE) 03/2008   treated with coumadin for 6 months  . History of sepsis 04/20/2018   per discharge note , probable UTI  . Hypertension   . IBS (irritable bowel syndrome)   . Nocturia   . OSA on CPAP   . Pancreatic lesion    0.4cm cystic per CT 07/ 2019  . Pneumonia   . Pulmonary nodule    Solid 71m Right Lower Lobe  . Right renal mass 04/10/2018   new dx--  scheduled for nephrectomy 05-16-2018  . S/P ureteral stent placement: 04/16/2018 04/19/2018    Past Surgical History:  Procedure Laterality Date  . AV FISTULA PLACEMENT Left 05/07/2018   Procedure: Creation of Left arm Radiocephalic Fistula;  Surgeon: CMarty Heck MD;  [facility]  Service: Vascular;  Laterality: Left;  . AV FISTULA PLACEMENT Left 05/15/2019   Procedure: Creation of Brachiocephalic fistula left arm;  Surgeon: CWaynetta Sandy MD;  [facility]  Service: Vascular;  Laterality: Left;  . CYSTOSCOPY/RETROGRADE/URETEROSCOPY/STONE EXTRACTION WITH BASKET  x2 last one 1990s approx.  . CYSTOSCOPY/URETEROSCOPY/HOLMIUM LASER/STENT PLACEMENT Left 04/16/2018   Procedure: CYSTOSCOPY/LEFT URETEROSCOPY/LEFT RETROGRADE/STENT PLACEMENT;  Surgeon: BLucas Mallow MD;  [facility];  Service: Urology;  Laterality: Left;  . EUS N/A 05/03/2018   Procedure: UPPER ENDOSCOPIC ULTRASOUND (EUS) RADIAL;  Surgeon: JMilus Banister MD;  [facility];  Service: Gastroenterology;  Laterality: N/A;  . EUS N/A 05/03/2018   Procedure: UPPER ENDOSCOPIC ULTRASOUND (EUS) LINEAR;  Surgeon: JMilus Banister MD;  [facility];  Service: Gastroenterology;  Laterality:  N/A;  . EXTRACORPOREAL SHOCK WAVE LITHOTRIPSY  x3  last one 2004 approx.  .Marland KitchenFINE NEEDLE ASPIRATION N/A 05/03/2018   Procedure: FINE NEEDLE ASPIRATION (FNA) LINEAR;  Surgeon: JMilus Banister MD;  [facility];  Service: Gastroenterology;  Laterality: N/A;  . LAPAROSCOPIC NEPHRECTOMY, HAND ASSISTED Right 05/16/2018   Procedure: HAND ASSISTED LAPAROSCOPIC RIGHT NEPHRECTOMY;  Surgeon: BLucas Mallow MD;  [facility];  Service: Urology;  Laterality: Right;  . left index finger attachment  1980s   3-4 surgeries  . TOE SURGERY  1980s   in beween 2nd and 3rd toes cyst removed    Family History  Problem Relation Age of Onset  . Alzheimer's disease Mother   . Alzheimer's disease Father   . Heart disease Father   . Heart attack Father   . Cancer - Other Sister     Social History   Socioeconomic History  . Marital status: Married    Spouse name: Not on file  . Number of children: Not on  file  . Years of education: Not on file  . Highest education level: Not on file  Occupational History  . Occupation: disabled  Social Needs  . Financial resource strain: Patient refused  . Food insecurity    Worry: Never true    Inability: Never true  . Transportation needs    Medical: No    Non-medical: No  Tobacco Use  . Smoking status: Never Smoker  . Smokeless tobacco: Never Used  Substance and Sexual Activity  . Alcohol use: Never    Frequency: Never  . Drug use: Never  . Sexual activity: Not on file  Lifestyle  . Physical activity    Days per week: 0 days    Minutes per session: 0 min  . Stress: Not on file  Relationships  . Social connections    Talks on phone: More than three times a week    Gets together: Never    Attends religious service: More than 4 times per year    Active member of club or organization: No    Attends meetings of clubs or organizations: Never    Relationship status: Married  . Intimate partner violence    Fear of current or ex partner:  Not on file    Emotionally abused: Not on file    Physically abused: Not on file    Forced sexual activity: Not on file  Other Topics Concern  . Not on file  Social History Narrative  . Not on file    Review of systems: Review of Systems  Constitutional: Negative for fever and chills.  HENT: Negative.   Eyes: Negative for blurred vision.  Respiratory: as per HPI  Cardiovascular: Negative for chest pain and palpitations.  Gastrointestinal: Negative for vomiting, diarrhea, blood per rectum. Genitourinary: Negative for dysuria, urgency, frequency and hematuria.  Musculoskeletal: Negative for myalgias, back pain and joint pain.  Skin: Negative for itching and rash.  Neurological: Negative for dizziness, tremors, focal weakness, seizures and loss of consciousness.  Endo/Heme/Allergies: Negative for environmental allergies.  Psychiatric/Behavioral: Negative for depression, suicidal ideas and hallucinations.  All other systems reviewed and are negative.  Physical Exam: Blood pressure 122/84, pulse 82, temperature 97.6 F (36.4 C), temperature source Temporal, height _0  (1.854 m), weight 272 lb 12.8 oz (123.7 kg), SpO2 97 %. Gen:      No acute distress HEENT:  EOMI, sclera anicteric Neck:     No masses; no thyromegaly Lungs:    Clear to auscultation bilaterally; normal respiratory effort CV:         Regular rate and rhythm; no murmurs Abd:      + bowel sounds; soft, non-tender; no palpable masses, no distension Ext:    No edema; adequate peripheral perfusion Skin:      Warm and dry; no rash Neuro: alert and oriented x 3 Psych: normal mood and affect  Data Reviewed: Imaging: CT abdomen pelvis 12/20/2018- no abnormalities in the lower chest.  Left nephrolithiasis with mild hydronephrosis.  Status post right nephrectomy.  I have reviewed the images personally.  Assessment:  Referral for lung nodule As per patient he has had recent urologic imaging which shows new/enlarging lung  nodule although CT abdomen pelvis from April this year does not show any lung abnormality.  There were no records or imaging available to review.  We will get these from the urology office Schedule for CT chest.  Will do a super D scan in case he needs navigational bronchoscopy. Follow-up in 1-2  weeks for review and plan for next steps.  OSA Has been on CPAP for 14 years.  He used to follow with Dr[facility]ren Rang at [facility] but not anymore We will get download for review.  Plan/Recommendations: - Obtain outside records, imaging - Schedule CT chest  Marshell Garfinkel MD [facility] 06/12/2019, 11:13 AM  CC: Curt Bears, MD

## 2019-06-12 NOTE — Patient Instructions (Signed)
I will schedule you for super D CT of the chest We will get records from Dr. Gloriann Loan at [facility] urology regarding your previous imaging.  It would be helpful to get these images on a disc for review  I will have you follow-up with my partner Dr. Valeta Harms in 1-2 weeks in case you need a procedure called a bronchoscope for biopsy of the lung nodules

## 2019-06-12 NOTE — Telephone Encounter (Signed)
Spoke with connie in medical records at [facility] urology. 4 disc mailed from [facility] urology to office all CTs from 2019-present placed in mail 06/13/19.  She is also going to send last office note.  Will route to Dr. Vaughan Browner as Juluis Rainier and Danae Chen to look for disc in mail

## 2019-06-18 ENCOUNTER — Other Ambulatory Visit: Payer: Self-pay

## 2019-06-18 ENCOUNTER — Ambulatory Visit (INDEPENDENT_AMBULATORY_CARE_PROVIDER_SITE_OTHER): Payer: PRIVATE HEALTH INSURANCE | Admitting: Pharmacist Clinician (PhC)/ Clinical Pharmacy Specialist

## 2019-06-18 DIAGNOSIS — Z7901 Long term (current) use of anticoagulants: Secondary | ICD-10-CM | POA: Diagnosis not present

## 2019-06-18 DIAGNOSIS — I4891 Unspecified atrial fibrillation: Secondary | ICD-10-CM | POA: Diagnosis not present

## 2019-06-18 LAB — POCT INR: INR: 1.7 — AB (ref 2.0–3.0)

## 2019-06-18 NOTE — Patient Instructions (Signed)
Take 2 tablets today, then increase dose to 1.5 tablets daily except 1 tablet each Tuesday and Thursday.  Repeat INR in 1 week

## 2019-06-20 NOTE — Telephone Encounter (Signed)
Thank you I will close message

## 2019-06-20 NOTE — Telephone Encounter (Signed)
Yes it came in today I will give it to [place] so she has it when he see's him in office.

## 2019-06-20 NOTE — Telephone Encounter (Signed)
George Lewis have you seen this disc. Patient has appointment with Dr. Valeta Harms 06/26/19

## 2019-06-21 ENCOUNTER — Other Ambulatory Visit: Payer: Self-pay

## 2019-06-21 ENCOUNTER — Encounter (INDEPENDENT_AMBULATORY_CARE_PROVIDER_SITE_OTHER): Payer: Self-pay

## 2019-06-21 ENCOUNTER — Ambulatory Visit (INDEPENDENT_AMBULATORY_CARE_PROVIDER_SITE_OTHER)
Admission: RE | Admit: 2019-06-21 | Discharge: 2019-06-21 | Disposition: A | Discharge status: Home / Self Care | Payer: PRIVATE HEALTH INSURANCE | Source: Ambulatory Visit | Attending: Pulmonary Disease | Admitting: Pulmonary Disease

## 2019-06-21 DIAGNOSIS — R918 Other nonspecific abnormal finding of lung field: Secondary | ICD-10-CM

## 2019-06-25 ENCOUNTER — Ambulatory Visit (INDEPENDENT_AMBULATORY_CARE_PROVIDER_SITE_OTHER): Payer: PRIVATE HEALTH INSURANCE | Admitting: Pharmacist

## 2019-06-25 ENCOUNTER — Other Ambulatory Visit: Payer: Self-pay

## 2019-06-25 ENCOUNTER — Other Ambulatory Visit: Payer: Self-pay | Admitting: Internal Medicine

## 2019-06-25 DIAGNOSIS — Z7901 Long term (current) use of anticoagulants: Secondary | ICD-10-CM | POA: Diagnosis not present

## 2019-06-25 DIAGNOSIS — I4891 Unspecified atrial fibrillation: Secondary | ICD-10-CM | POA: Diagnosis not present

## 2019-06-25 DIAGNOSIS — N186 End stage renal disease: Secondary | ICD-10-CM

## 2019-06-25 DIAGNOSIS — Z992 Dependence on renal dialysis: Secondary | ICD-10-CM

## 2019-06-25 LAB — POCT INR: INR: 2.2 (ref 2.0–3.0)

## 2019-06-25 NOTE — Patient Instructions (Signed)
Continue taking 1.5 tablets daily except 1 tablet each Tuesday and Thursday.  Repeat INR in 2 week

## 2019-06-26 ENCOUNTER — Encounter: Payer: Self-pay | Admitting: Pulmonary Disease

## 2019-06-26 ENCOUNTER — Ambulatory Visit: Payer: PRIVATE HEALTH INSURANCE | Admitting: Pulmonary Disease

## 2019-06-26 VITALS — BP 100/64 | HR 85 | Temp 97.4°F | Ht 73.0 in | Wt 271.0 lb

## 2019-06-26 DIAGNOSIS — R911 Solitary pulmonary nodule: Secondary | ICD-10-CM | POA: Diagnosis not present

## 2019-06-26 DIAGNOSIS — N186 End stage renal disease: Secondary | ICD-10-CM

## 2019-06-26 DIAGNOSIS — Z85528 Personal history of other malignant neoplasm of kidney: Secondary | ICD-10-CM | POA: Diagnosis not present

## 2019-06-26 DIAGNOSIS — R918 Other nonspecific abnormal finding of lung field: Secondary | ICD-10-CM

## 2019-06-26 DIAGNOSIS — Z992 Dependence on renal dialysis: Secondary | ICD-10-CM

## 2019-06-26 NOTE — Patient Instructions (Addendum)
Thank you for visiting Dr. Valeta Harms at [facility]. Today we recommend the following: Orders Placed This Encounter  Procedures  . NM PET Image Initial (PI) Skull Base To Thigh   Return in about 6 weeks (around 08/07/2019) for with APP or Dr. Valeta Harms.    Please do your part to reduce the spread of COVID-19.

## 2019-06-26 NOTE — Progress Notes (Signed)
Synopsis: Referred in October 2020 for lung nodule by Esaw Grandchild, NP  Subjective:   [name]. GENDER: male DOB: [birth year], MRN: [id number]  Chief Complaint  Patient presents with  . Follow-up    Patient with a past medical history of atrial fibrillation on Coumadin, OSA on CPAP, diabetes, CKD.  Patient seen today in consultation for lingular lung nodule.  Patient has a past medical history of renal cell carcinoma diagnosed in 2019 status post nephrectomy.  Subsequent scans have been stable.  Did find a lingular lung nodule in January 2020.  Subsequent CT imaging follow-up in May showed enlargement to 9 x 8 mm.  Subsequent imaging in July as well as recently in October 2020 both demonstrated stability of the nodule at 9 x 8 mm.  He is also on dialysis Tuesday Thursday Saturday.  He has a recent fistula failure with a tunneled cath currently in place and plans for AV fistula revision.  Overall very worried and concerned about the lung nodule.  He has been told that it could represent spread of his previous cancer.  Today we reviewed the images in the office.  Otherwise he has no symptoms.  No respiratory symptoms.  Lifelong non-smoker.  Lifelong no alcohol use.  Patient denies hemoptysis fevers chills night sweats weight loss.   Past Medical History:  Diagnosis Date  . Anemia   . Arthritis    knees  . Atrial fibrillation ([place])   . Bilateral renal cysts 03/23/2018   Noted MRI ABD  . Cancer [facility])    right kidney cancer  . Cholelithiasis 03/19/2018   noted on CT AB/Pelvis  . CKD (chronic kidney disease), stage IV [facility])    nephrologist-- dr Hollie Salk [name]);  05-01-2018 has not started dialysis, scheduled for AV fistula creation 05-07-2018  . Diabetes mellitus without complication ([place])   . Diverticulosis of colon 04/19/2018   Noted on CT abd/pelvis  . GERD (gastroesophageal reflux disease)   . Hepatic steatosis 03/19/2018   Mild diffuse, noted on  CT AB/Pelvis  . History of kidney stones   . History of pulmonary embolus (PE) 03/2008   treated with coumadin for 6 months  . History of sepsis 04/20/2018   per discharge note , probable UTI  . Hypertension   . IBS (irritable bowel syndrome)   . Nocturia   . OSA on CPAP   . Pancreatic lesion    0.4cm cystic per CT 07/ 2019  . Pneumonia   . Pulmonary nodule    Solid 73m Right Lower Lobe  . Right renal mass 04/10/2018   new dx--  scheduled for nephrectomy 05-16-2018  . S/P ureteral stent placement: 04/16/2018 04/19/2018     Family History  Problem Relation Age of Onset  . Alzheimer's disease Mother   . Alzheimer's disease Father   . Heart disease Father   . Heart attack Father   . Cancer - Other Sister      Past Surgical History:  Procedure Laterality Date  . AV FISTULA PLACEMENT Left 05/07/2018   Procedure: Creation of Left arm Radiocephalic Fistula;  Surgeon: CMarty Heck MD;  [place] [facility]  Service: Vascular;  Laterality: Left;  . AV FISTULA PLACEMENT Left 05/15/2019   Procedure: Creation of Brachiocephalic fistula left arm;  Surgeon: CWaynetta Sandy MD;  [place] [facility]  Service: Vascular;  Laterality: Left;  . CYSTOSCOPY/RETROGRADE/URETEROSCOPY/STONE EXTRACTION WITH BASKET  x2 last one 1990s approx.  . CYSTOSCOPY/URETEROSCOPY/HOLMIUM LASER/STENT PLACEMENT  Left 04/16/2018   Procedure: CYSTOSCOPY/LEFT URETEROSCOPY/LEFT RETROGRADE/STENT PLACEMENT;  Surgeon: Lucas Mallow, MD;  [facility];  Service: Urology;  Laterality: Left;  . EUS N/A 05/03/2018   Procedure: UPPER ENDOSCOPIC ULTRASOUND (EUS) RADIAL;  Surgeon: Milus Banister, MD;  [facility];  Service: Gastroenterology;  Laterality: N/A;  . EUS N/A 05/03/2018   Procedure: UPPER ENDOSCOPIC ULTRASOUND (EUS) LINEAR;  Surgeon: Milus Banister, MD;  [facility];  Service: Gastroenterology;  Laterality: N/A;  . EXTRACORPOREAL SHOCK WAVE LITHOTRIPSY  x3  last one 2004 approx.  Marland Kitchen  FINE NEEDLE ASPIRATION N/A 05/03/2018   Procedure: FINE NEEDLE ASPIRATION (FNA) LINEAR;  Surgeon: Milus Banister, MD;  [facility];  Service: Gastroenterology;  Laterality: N/A;  . LAPAROSCOPIC NEPHRECTOMY, HAND ASSISTED Right 05/16/2018   Procedure: HAND ASSISTED LAPAROSCOPIC RIGHT NEPHRECTOMY;  Surgeon: Lucas Mallow, MD;  Location: [facility];  Service: Urology;  Laterality: Right;  . left index finger attachment  1980s   3-4 surgeries  . TOE SURGERY  1980s   in beween 2nd and 3rd toes cyst removed    Social History   Socioeconomic History  . Marital status: Married    Spouse name: Not on file  . Number of children: Not on file  . Years of education: Not on file  . Highest education level: Not on file  Occupational History  . Occupation: disabled  Social Needs  . Financial resource strain: Patient refused  . Food insecurity    Worry: Never true    Inability: Never true  . Transportation needs    Medical: No    Non-medical: No  Tobacco Use  . Smoking status: Never Smoker  . Smokeless tobacco: Never Used  Substance and Sexual Activity  . Alcohol use: Never    Frequency: Never  . Drug use: Never  . Sexual activity: Not on file  Lifestyle  . Physical activity    Days per week: 0 days    Minutes per session: 0 min  . Stress: Not on file  Relationships  . Social connections    Talks on phone: More than three times a week    Gets together: Never    Attends religious service: More than 4 times per year    Active member of club or organization: No    Attends meetings of clubs or organizations: Never    Relationship status: Married  . Intimate partner violence    Fear of current or ex partner: Not on file    Emotionally abused: Not on file    Physically abused: Not on file    Forced sexual activity: Not on file  Other Topics Concern  . Not on file  Social History Narrative  . Not on file     Allergies  Allergen Reactions  . Penicillins Rash and Other  (See Comments)    Has patient had a PCN reaction causing immediate rash, facial/tongue/throat swelling, SOB or lightheadedness with hypotension: yes Has patient had a PCN reaction causing severe rash involving mucus membranes or skin necrosis: no Has patient had a PCN reaction that required hospitalization: no Has patient had a PCN reaction occurring within the last 10 years: no If all of the above answers are "NO", then may proceed with Cephalosporin use.      Outpatient Medications Prior to Visit  Medication Sig Dispense Refill  . acetaminophen (TYLENOL) 500 MG tablet Take 1,000 mg by mouth every 6 (six) hours as needed for mild  pain or headache.     . allopurinol (ZYLOPRIM) 100 MG tablet Take 1 tablet (100 mg total) by mouth every evening. 30 tablet 0  . aspirin EC 81 MG EC tablet Take 1 tablet (81 mg total) by mouth daily. (Patient taking differently: Take 81 mg by mouth every evening. ) 30 tablet 0  . atorvastatin (LIPITOR) 40 MG tablet Take 1 tablet (40 mg total) by mouth daily. (Patient taking differently: Take 40 mg by mouth every evening. ) 30 tablet 0  . blood glucose meter kit and supplies Dispense based on patient and insurance preference. Use up to four times daily as directed. (FOR ICD-10 E10.9, E11.9). 1 each 0  . diphenhydramine-acetaminophen (TYLENOL PM) 25-500 MG TABS tablet Take 2 tablets by mouth at bedtime as needed (sleep).    . fluticasone (FLONASE) 50 MCG/ACT nasal spray Place 1 spray into both nostrils daily as needed for allergies or rhinitis.    Marland Kitchen LANTUS SOLOSTAR 100 UNIT/ML Solostar Pen Inject 13 Units into the skin at bedtime.     Marland Kitchen loratadine (CLARITIN) 10 MG tablet Take 10 mg by mouth every evening.     . metoprolol tartrate (LOPRESSOR) 25 MG tablet Take 25 mg by mouth daily as needed (for elevated blood pressure (greater than 130)).    Marland Kitchen nitroGLYCERIN (NITROSTAT) 0.4 MG SL tablet Place 1 tablet (0.4 mg total) under the tongue every 5 (five) minutes as needed for  chest pain. 14 tablet 12  . omeprazole (PRILOSEC OTC) 20 MG tablet Take 40 mg by mouth daily before breakfast.     . tamsulosin (FLOMAX) 0.4 MG CAPS capsule Take 0.4 mg by mouth every evening.   3  . warfarin (COUMADIN) 3 MG tablet Take as directed by [facility] (Patient taking differently: Take 3 mg by mouth every evening. Take as directed by [facility]) 35 tablet 1   No facility-administered medications prior to visit.     Review of Systems  Constitutional: Negative for chills, fever, malaise/fatigue and weight loss.  HENT: Negative for hearing loss, sore throat and tinnitus.   Eyes: Negative for blurred vision and double vision.  Respiratory: Negative for cough, hemoptysis, sputum production, shortness of breath, wheezing and stridor.   Cardiovascular: Negative for chest pain, palpitations, orthopnea, leg swelling and PND.  Gastrointestinal: Negative for abdominal pain, constipation, diarrhea, heartburn, nausea and vomiting.  Genitourinary: Negative for dysuria, hematuria and urgency.  Musculoskeletal: Negative for joint pain and myalgias.  Skin: Negative for itching and rash.  Neurological: Negative for dizziness, tingling, weakness and headaches.  Endo/Heme/Allergies: Negative for environmental allergies. Does not bruise/bleed easily.  Psychiatric/Behavioral: Negative for depression. The patient is not nervous/anxious and does not have insomnia.   All other systems reviewed and are negative.    Objective:  Physical Exam Vitals signs reviewed.  Constitutional:      General: He is not in acute distress.    Appearance: He is well-developed. He is obese.  HENT:     Head: Normocephalic and atraumatic.  Eyes:     General: No scleral icterus.    Conjunctiva/sclera: Conjunctivae normal.     Pupils: Pupils are equal, round, and reactive to light.  Neck:     Musculoskeletal: Neck supple.     Vascular: No JVD.     Trachea: No tracheal deviation.  Cardiovascular:     Rate  and Rhythm: Normal rate and regular rhythm.     Heart sounds: Normal heart sounds. No murmur.  Pulmonary:  Effort: Pulmonary effort is normal. No tachypnea, accessory muscle usage or respiratory distress.     Breath sounds: No stridor. No wheezing, rhonchi or rales.  Abdominal:     General: Bowel sounds are normal. There is no distension.     Palpations: Abdomen is soft.     Tenderness: There is no abdominal tenderness.  Musculoskeletal:        General: No tenderness.     Comments: Right chest catheter Left arm failed fistula, scar  Lymphadenopathy:     Cervical: No cervical adenopathy.  Skin:    General: Skin is warm and dry.     Capillary Refill: Capillary refill takes less than 2 seconds.     Findings: No rash.  Neurological:     Mental Status: He is alert and oriented to person, place, and time.  Psychiatric:        Behavior: Behavior normal.      Vitals:   06/26/19 1422  BP: 100/64  Pulse: 85  Temp: (!) 97.4 F (36.3 C)  TempSrc: Oral  SpO2: 98%  Weight: 271 lb (122.9 kg)  Height: '6\' 1"'  (1.854 m)   98% on RA BMI Readings from Last 3 Encounters:  06/26/19 35.75 kg/m  06/12/19 35.99 kg/m  05/28/19 36.04 kg/m   Wt Readings from Last 3 Encounters:  06/26/19 271 lb (122.9 kg)  06/12/19 272 lb 12.8 oz (123.7 kg)  05/28/19 273 lb 3.2 oz (123.9 kg)     CBC    Component Value Date/Time   WBC 7.6 04/23/2019 1330   RBC 4.58 04/23/2019 1330   HGB 12.6 (L) 05/15/2019 0715   HCT 37.0 (L) 05/15/2019 0715   PLT 209 04/23/2019 1330   MCV 82.1 04/23/2019 1330   MCH 27.3 04/23/2019 1330   MCHC 33.2 04/23/2019 1330   RDW 15.3 04/23/2019 1330   LYMPHSABS 2.2 12/19/2018 0048   MONOABS 1.0 12/19/2018 0048   EOSABS 0.4 12/19/2018 0048   BASOSABS 0.1 12/19/2018 0048     Chest Imaging: CT chest imaging from January 2020, May 2020, July 20 18 June 2019. All of the images reviewed today in succession with the patient. Imaging originally showed small lung  nodule in the lingula in January which showed enlargement progression it may up to 9 mm x 8 mm. He has documented stability from that point until October, last week which revealed a stable 9 mm x 8 mm lung nodule. Also has a another small nodule within the azygous recess of the right lower lobe that is 4 x 5 mm. The patient's images have been independently reviewed by me.    Pulmonary Functions Testing Results: No flowsheet data found.  FeNO: None   Pathology: None   Echocardiogram: None   Heart Catheterization: None     Assessment & Plan:     ICD-10-CM   1. Nodule of upper lobe of left lung  R91.1   2. Abnormal findings on diagnostic imaging of lung  R91.8 NM PET Image Initial (PI) Skull Base To Thigh  3. History of renal cell cancer  Z85.528   4. ESRD on hemodialysis ([place])  N18.6    Z99.2     Discussion:  This is a 63 year old gentleman with a left upper lobe lingular lung nodule that has been followed now for several months.  He has a past medical history of renal cell carcinoma status post nephrectomy in 2019.  No chemotherapy following this.  He is ESRD on dialysis now.  Plan:  We  will start by evaluating the nodule with PET scan. If the PET scan is negative I feel like we can continue to follow the lung nodule with serial CAT scan imaging potentially 6 months from now. Patient is very anxious about this and I believe the PET scan will help [place] discern next best steps. If PET scan is positive we may need to consider percutaneous needle biopsy. It is currently a very small lesion and very peripheral within the lingula.  We could potentially consider navigational bronchoscopy however we may need to consider additional planning for this.  It may be best reached by interventional radiology.  Greater than 50% of this patient's 25-minute office visit was spent discussing patient's CAT scan imaging and expects to have further diagnostics.     Current Outpatient Medications:  .   acetaminophen (TYLENOL) 500 MG tablet, Take 1,000 mg by mouth every 6 (six) hours as needed for mild pain or headache. , Disp: , Rfl:  .  allopurinol (ZYLOPRIM) 100 MG tablet, Take 1 tablet (100 mg total) by mouth every evening., Disp: 30 tablet, Rfl: 0 .  aspirin EC 81 MG EC tablet, Take 1 tablet (81 mg total) by mouth daily. (Patient taking differently: Take 81 mg by mouth every evening. ), Disp: 30 tablet, Rfl: 0 .  atorvastatin (LIPITOR) 40 MG tablet, Take 1 tablet (40 mg total) by mouth daily. (Patient taking differently: Take 40 mg by mouth every evening. ), Disp: 30 tablet, Rfl: 0 .  blood glucose meter kit and supplies, Dispense based on patient and insurance preference. Use up to four times daily as directed. (FOR ICD-10 E10.9, E11.9)., Disp: 1 each, Rfl: 0 .  diphenhydramine-acetaminophen (TYLENOL PM) 25-500 MG TABS tablet, Take 2 tablets by mouth at bedtime as needed (sleep)., Disp: , Rfl:  .  fluticasone (FLONASE) 50 MCG/ACT nasal spray, Place 1 spray into both nostrils daily as needed for allergies or rhinitis., Disp: , Rfl:  .  LANTUS SOLOSTAR 100 UNIT/ML Solostar Pen, Inject 13 Units into the skin at bedtime. , Disp: , Rfl:  .  loratadine (CLARITIN) 10 MG tablet, Take 10 mg by mouth every evening. , Disp: , Rfl:  .  metoprolol tartrate (LOPRESSOR) 25 MG tablet, Take 25 mg by mouth daily as needed (for elevated blood pressure (greater than 130))., Disp: , Rfl:  .  nitroGLYCERIN (NITROSTAT) 0.4 MG SL tablet, Place 1 tablet (0.4 mg total) under the tongue every 5 (five) minutes as needed for chest pain., Disp: 14 tablet, Rfl: 12 .  omeprazole (PRILOSEC OTC) 20 MG tablet, Take 40 mg by mouth daily before breakfast. , Disp: , Rfl:  .  tamsulosin (FLOMAX) 0.4 MG CAPS capsule, Take 0.4 mg by mouth every evening. , Disp: , Rfl: 3 .  warfarin (COUMADIN) 3 MG tablet, Take as directed by [facility] (Patient taking differently: Take 3 mg by mouth every evening. Take as directed by [facility]  Clinic), Disp: 35 tablet, Rfl: 1   Garner Nash, DO [facility] 06/26/2019 2:45 PM

## 2019-06-27 ENCOUNTER — Other Ambulatory Visit: Payer: Self-pay

## 2019-06-27 ENCOUNTER — Telehealth (HOSPITAL_COMMUNITY): Payer: Self-pay

## 2019-06-27 MED ORDER — WARFARIN SODIUM 3 MG PO TABS: 3.0000 mg | ORAL_TABLET | Freq: Every evening | ORAL | 0 refills | Status: DC

## 2019-06-27 NOTE — Telephone Encounter (Signed)

## 2019-06-28 ENCOUNTER — Ambulatory Visit (HOSPITAL_COMMUNITY)
Admission: RE | Admit: 2019-06-28 | Discharge: 2019-06-28 | Disposition: A | Discharge status: Home / Self Care | Payer: PRIVATE HEALTH INSURANCE | Source: Ambulatory Visit | Attending: Vascular Surgery | Admitting: Vascular Surgery

## 2019-06-28 ENCOUNTER — Other Ambulatory Visit: Payer: Self-pay

## 2019-06-28 ENCOUNTER — Other Ambulatory Visit: Payer: Self-pay | Admitting: *Deleted

## 2019-06-28 ENCOUNTER — Encounter: Payer: Self-pay | Admitting: *Deleted

## 2019-06-28 ENCOUNTER — Ambulatory Visit (INDEPENDENT_AMBULATORY_CARE_PROVIDER_SITE_OTHER): Payer: Self-pay | Admitting: Physician Assistant

## 2019-06-28 VITALS — BP 96/69 | HR 78 | Temp 97.9°F | Resp 12 | Ht 73.0 in | Wt 271.9 lb

## 2019-06-28 DIAGNOSIS — Z992 Dependence on renal dialysis: Secondary | ICD-10-CM

## 2019-06-28 DIAGNOSIS — N186 End stage renal disease: Secondary | ICD-10-CM

## 2019-06-28 NOTE — Progress Notes (Signed)
POST OPERATIVE OFFICE NOTE    CC:  F/u for surgery  HPI:  This is a 63 y.o. male who is s/p Left arm first stage basilic vein transposition fistula.  He is now 4 weeks out from surgery.  He denise pain, loss of motor and loss of sensation in the left UE.  He is here for exam and planning of second stage basilic.  Allergies  Allergen Reactions  . Penicillins Rash and Other (See Comments)    Has patient had a PCN reaction causing immediate rash, facial/tongue/throat swelling, SOB or lightheadedness with hypotension: yes Has patient had a PCN reaction causing severe rash involving mucus membranes or skin necrosis: no Has patient had a PCN reaction that required hospitalization: no Has patient had a PCN reaction occurring within the last 10 years: no If all of the above answers are "NO", then may proceed with Cephalosporin use.     Current Outpatient Medications  Medication Sig Dispense Refill  . acetaminophen (TYLENOL) 500 MG tablet Take 1,000 mg by mouth every 6 (six) hours as needed for mild pain or headache.     . allopurinol (ZYLOPRIM) 100 MG tablet Take 1 tablet (100 mg total) by mouth every evening. 30 tablet 0  . aspirin EC 81 MG EC tablet Take 1 tablet (81 mg total) by mouth daily. (Patient taking differently: Take 81 mg by mouth every evening. ) 30 tablet 0  . atorvastatin (LIPITOR) 40 MG tablet Take 1 tablet (40 mg total) by mouth daily. (Patient taking differently: Take 40 mg by mouth every evening. ) 30 tablet 0  . blood glucose meter kit and supplies Dispense based on patient and insurance preference. Use up to four times daily as directed. (FOR ICD-10 E10.9, E11.9). 1 each 0  . diphenhydramine-acetaminophen (TYLENOL PM) 25-500 MG TABS tablet Take 2 tablets by mouth at bedtime as needed (sleep).    . fluticasone (FLONASE) 50 MCG/ACT nasal spray Place 1 spray into both nostrils daily as needed for allergies or rhinitis.    Marland Kitchen LANTUS SOLOSTAR 100 UNIT/ML Solostar Pen Inject 13  Units into the skin at bedtime.     Marland Kitchen loratadine (CLARITIN) 10 MG tablet Take 10 mg by mouth every evening.     . metoprolol tartrate (LOPRESSOR) 25 MG tablet Take 25 mg by mouth daily as needed (for elevated blood pressure (greater than 130)).    Marland Kitchen nitroGLYCERIN (NITROSTAT) 0.4 MG SL tablet Place 1 tablet (0.4 mg total) under the tongue every 5 (five) minutes as needed for chest pain. 14 tablet 12  . omeprazole (PRILOSEC OTC) 20 MG tablet Take 40 mg by mouth daily before breakfast.     . tamsulosin (FLOMAX) 0.4 MG CAPS capsule Take 0.4 mg by mouth every evening.   3  . warfarin (COUMADIN) 3 MG tablet Take 1 tablet (3 mg total) by mouth every evening. Take as directed by [facility] 90 tablet 0   No current facility-administered medications for this visit.      ROS:  See HPI  Physical Exam:    Incision:  Well healed left AC incision Extremities:  Grip 5/5 sensation intact equal B UE, palpable radial pulse. heart: RRR Abdomen:  Soft, NTTP Lungs non labored breathing, CTA  Assessment/Plan:  This is a 63 y.o. male who is s/p:First stage Basilic AV fistula   His fistula is ,maturing well diameter> 0.9 cm, however it is deep from the surface of his skin.  He will be scheduled for second stage basilic  fistula transposition.  HD TTS.  He is on HD via right TDC.   Roxy Horseman PA-C [facility] [phone]  Clinic MD:  Donzetta Matters

## 2019-07-09 ENCOUNTER — Ambulatory Visit (INDEPENDENT_AMBULATORY_CARE_PROVIDER_SITE_OTHER): Payer: PRIVATE HEALTH INSURANCE | Admitting: Pharmacist

## 2019-07-09 ENCOUNTER — Other Ambulatory Visit: Payer: Self-pay

## 2019-07-09 DIAGNOSIS — I4891 Unspecified atrial fibrillation: Secondary | ICD-10-CM

## 2019-07-09 DIAGNOSIS — Z7901 Long term (current) use of anticoagulants: Secondary | ICD-10-CM

## 2019-07-09 LAB — POCT INR: INR: 2.2 (ref 2.0–3.0)

## 2019-07-10 ENCOUNTER — Ambulatory Visit: Payer: PRIVATE HEALTH INSURANCE | Admitting: Acute Care

## 2019-07-12 ENCOUNTER — Other Ambulatory Visit (HOSPITAL_COMMUNITY)
Admission: RE | Admit: 2019-07-12 | Discharge: 2019-07-12 | Disposition: A | Discharge status: Home / Self Care | Payer: PRIVATE HEALTH INSURANCE | Source: Ambulatory Visit | Attending: Vascular Surgery | Admitting: Vascular Surgery

## 2019-07-12 ENCOUNTER — Encounter (HOSPITAL_COMMUNITY): Payer: Self-pay | Admitting: *Deleted

## 2019-07-12 DIAGNOSIS — Z20828 Contact with and (suspected) exposure to other viral communicable diseases: Secondary | ICD-10-CM | POA: Insufficient documentation

## 2019-07-12 DIAGNOSIS — Z01812 Encounter for preprocedural laboratory examination: Secondary | ICD-10-CM | POA: Diagnosis present

## 2019-07-12 LAB — SARS CORONAVIRUS 2 (TAT 6-24 HRS): SARS Coronavirus 2: NEGATIVE

## 2019-07-12 MED ORDER — VANCOMYCIN HCL 10 G IV SOLR
1500.0000 mg | INTRAVENOUS | Status: AC
  Administered 2019-07-15: 1500 mg via INTRAVENOUS
  Filled 2019-07-12: qty 1500

## 2019-07-12 NOTE — Anesthesia Preprocedure Evaluation (Addendum)
Anesthesia Evaluation  Patient identified by MRN, date of birth, ID band Patient awake    Reviewed: Allergy & Precautions, NPO status , Patient's Chart, lab work & pertinent test results  Airway Mallampati: II  TM Distance: >3 FB     Dental   Pulmonary sleep apnea , pneumonia,    breath sounds clear to auscultation       Cardiovascular hypertension, + Peripheral Vascular Disease   Rhythm:Regular Rate:Normal     Neuro/Psych negative neurological ROS     GI/Hepatic Neg liver ROS, GERD  ,  Endo/Other  diabetes  Renal/GU Renal disease     Musculoskeletal  (+) Arthritis ,   Abdominal   Peds  Hematology  (+) anemia ,   Anesthesia Other Findings   Reproductive/Obstetrics                            Anesthesia Physical Anesthesia Plan  ASA: III  Anesthesia Plan: General   Post-op Pain Management:    Induction: Intravenous  PONV Risk Score and Plan: 2 and Ondansetron and Midazolam  Airway Management Planned: LMA  Additional Equipment:   Intra-op Plan:   Post-operative Plan:   Informed Consent: I have reviewed the patients History and Physical, chart, labs and discussed the procedure including the risks, benefits and alternatives for the proposed anesthesia with the patient or authorized representative who has indicated his/her understanding and acceptance.     Dental advisory given  Plan Discussed with: CRNA and Anesthesiologist  Anesthesia Plan Comments: (Pt recently underwent stage 1 basilic vein transposition 0/34/74 without complication. Prior to that surgery he was cleared by cardiology (copied below). Currently on HD via [facility]. No interim health changes noted. Will need DOS labs and eval.   Cardiac clearance 05/14/19: "Patient with hx of Renal CA s/p nephrectomy, ESRD on dialysis, diabetes mellitus, hypertension, hyperlipidemia, prior pulmonary embolism, paroxysmal atrial  fibrillation.  He needs revision of his AV fistula.  He was admitted in July 2020 with chest pain.  Hs-Troponins were neg.  He was set up for an OP Myoview.  He was admitted in August 2020 with AF with RVR in the setting of GI illness.  He was placed on Coumadin anticoagulation and Amiodarone.  He has maintained normal sinus rhythm.  Echocardiogram 02/26/2019: EF 60-65, impaired relaxation, mild LAE. Myoview 05/10/2019: Apical thinning vs infarct, EF 52 (reviewed by Dr. Marlou Porch; felt to be low risk). PLAN:  1. Patient may proceed with non-cardiac surgery without further testing. He is at acceptable risk. 2. He may hold Warfarin for 3 days prior to his surgery and resume as soon as possible post op. 3. Please call with questions. 4. This note will be routed to Dr. Donzetta Matters in [place] and faxed to his office."   Nuclear stress 05/10/19:  The left ventricular ejection fraction is mildly decreased (45-54%).  Nuclear stress EF: 52%.  There was no ST segment deviation noted during stress.  Defect 1: There is a small defect of severe severity present in the apex location.  This is a low risk study.   Abnormal, low risk stress nuclear study with significant apical thinning vs infarct; cannot exclude very mild superimposed apical lateral ischemia; EF low normal (52) with normal wall motion.  Echo 02/26/19:  1. The left ventricle has normal systolic function with an ejection fraction of 60-65%. The cavity size was normal. Left ventricular diastolic Doppler parameters are consistent with impaired relaxation.  2. The right  ventricle has normal systolic function. The cavity was normal. There is no increase in right ventricular wall thickness.  3. Left atrial size was mildly dilated.  4. The aortic valve is tricuspid. Mild thickening of the aortic valve. Mild calcification of the aortic valve.)      Anesthesia Quick Evaluation

## 2019-07-12 NOTE — Progress Notes (Signed)
Patient denies shortness of breath, fever, cough and chest pain.  PCP - Mina Marble, NP Cardiologist - Dr Marlou Porch  Pulmonologist - Dr Valeta Harms Urologist- Dr Gloriann Loan  Chest x-ray - 03/06/19 EKG - 05/22/19 Stress Test - 05/10/19 ECHO - 02/26/19 Cardiac Cath - denies  Fasting Blood Sugar - 80-90s Checks Blood Sugar  3  times a day  . THE NIGHT BEFORE SURGERY, take 9 units of Lantus insulin    . If your blood sugar is less than 70 mg/dL, you will need to treat for low blood sugar: o Treat a low blood sugar (less than 70 mg/dL) with  cup of clear juice (cranberry or apple) o Recheck blood sugar in 15 minutes after treatment (to make sure it is greater than 70 mg/dL). If your blood sugar is not greater than 70 mg/dL on recheck, call [phone] for further instructions.  Blood Thinner Instructions: Hold coumadin 3 days prior to surgery per MD.  Coumadin last does was on 07/11/19.  Anesthesia review: Yes, James, PA  STOP now taking any Aleve, Naproxen, Ibuprofen, Motrin, Advil, Goody's, BC's, all herbal medications, fish oil, and all vitamins.   Coronavirus Screening Have you experienced the following symptoms:  Cough yes/no: No Fever (>100.40F)  yes/no: No Runny nose yes/no: No Sore throat yes/no: No Difficulty breathing/shortness of breath  yes/no: No  Have you traveled in the last 14 days and where? yes/no: No  Patient verbalized understanding of instructions that were given via phone.

## 2019-07-12 NOTE — Progress Notes (Signed)
Anesthesia Chart Review: Same day workup  Pt recently underwent stage 1 basilic vein transposition 0/34/91 without complication. Prior to that surgery he was cleared by cardiology (copied below). Currently on HD via [facility]. No interim health changes noted. Will need DOS labs and eval.   Cardiac clearance 05/14/19: "Patient with hx of Renal CA s/p nephrectomy, ESRD on dialysis, diabetes mellitus, hypertension, hyperlipidemia, prior pulmonary embolism, paroxysmal atrial fibrillation.  He needs revision of his AV fistula.  He was admitted in July 2020 with chest pain.  Hs-Troponins were neg.  He was set up for an OP Myoview.  He was admitted in August 2020 with AF with RVR in the setting of GI illness.  He was placed on Coumadin anticoagulation and Amiodarone.  He has maintained normal sinus rhythm.  Echocardiogram 02/26/2019: EF 60-65, impaired relaxation, mild LAE. Myoview 05/10/2019: Apical thinning vs infarct, EF 52 (reviewed by Dr. Marlou Porch; felt to be low risk). PLAN:  1. Patient may proceed with non-cardiac surgery without further testing. He is at acceptable risk. 2. He may hold Warfarin for 3 days prior to his surgery and resume as soon as possible post op. 3. Please call with questions. 4. This note will be routed to Dr. Donzetta Matters in [place] and faxed to his office."   Nuclear stress 05/10/19:  The left ventricular ejection fraction is mildly decreased (45-54%).  Nuclear stress EF: 52%.  There was no ST segment deviation noted during stress.  Defect 1: There is a small defect of severe severity present in the apex location.  This is a low risk study.   Abnormal, low risk stress nuclear study with significant apical thinning vs infarct; cannot exclude very mild superimposed apical lateral ischemia; EF low normal (52) with normal wall motion.  Echo 02/26/19:  1. The left ventricle has normal systolic function with an ejection fraction of 60-65%. The cavity size was normal. Left ventricular diastolic  Doppler parameters are consistent with impaired relaxation.  2. The right ventricle has normal systolic function. The cavity was normal. There is no increase in right ventricular wall thickness.  3. Left atrial size was mildly dilated.  4. The aortic valve is tricuspid. Mild thickening of the aortic valve. Mild calcification of the aortic valve.   Wynonia Musty [facility]/Anesthesiology Phone [phone] 07/12/2019 11:57 AM

## 2019-07-15 ENCOUNTER — Ambulatory Visit (HOSPITAL_COMMUNITY)
Admission: RE | Admit: 2019-07-15 | Discharge: 2019-07-15 | Disposition: A | Discharge status: Home / Self Care | Payer: PRIVATE HEALTH INSURANCE | Attending: Vascular Surgery | Admitting: Vascular Surgery

## 2019-07-15 ENCOUNTER — Ambulatory Visit (HOSPITAL_COMMUNITY): Payer: PRIVATE HEALTH INSURANCE | Admitting: Physician Assistant

## 2019-07-15 ENCOUNTER — Encounter (HOSPITAL_COMMUNITY): Payer: Self-pay

## 2019-07-15 ENCOUNTER — Encounter (HOSPITAL_COMMUNITY)
Admission: RE | Disposition: A | Discharge status: Home / Self Care | Payer: Self-pay | Source: Home / Self Care | Attending: Vascular Surgery

## 2019-07-15 ENCOUNTER — Other Ambulatory Visit: Payer: Self-pay

## 2019-07-15 DIAGNOSIS — Z992 Dependence on renal dialysis: Secondary | ICD-10-CM | POA: Insufficient documentation

## 2019-07-15 DIAGNOSIS — I120 Hypertensive chronic kidney disease with stage 5 chronic kidney disease or end stage renal disease: Secondary | ICD-10-CM | POA: Insufficient documentation

## 2019-07-15 DIAGNOSIS — Z85528 Personal history of other malignant neoplasm of kidney: Secondary | ICD-10-CM | POA: Insufficient documentation

## 2019-07-15 DIAGNOSIS — I480 Paroxysmal atrial fibrillation: Secondary | ICD-10-CM | POA: Diagnosis not present

## 2019-07-15 DIAGNOSIS — Z86711 Personal history of pulmonary embolism: Secondary | ICD-10-CM | POA: Insufficient documentation

## 2019-07-15 DIAGNOSIS — E785 Hyperlipidemia, unspecified: Secondary | ICD-10-CM | POA: Diagnosis not present

## 2019-07-15 DIAGNOSIS — N186 End stage renal disease: Secondary | ICD-10-CM | POA: Insufficient documentation

## 2019-07-15 DIAGNOSIS — E1122 Type 2 diabetes mellitus with diabetic chronic kidney disease: Secondary | ICD-10-CM | POA: Diagnosis not present

## 2019-07-15 DIAGNOSIS — N185 Chronic kidney disease, stage 5: Secondary | ICD-10-CM

## 2019-07-15 HISTORY — PX: BASCILIC VEIN TRANSPOSITION: SHX5742

## 2019-07-15 HISTORY — DX: Peripheral vascular disease, unspecified: I73.9

## 2019-07-15 LAB — POCT I-STAT, CHEM 8
BUN: 54 mg/dL — ABNORMAL HIGH (ref 8–23)
Calcium, Ion: 1.01 mmol/L — ABNORMAL LOW (ref 1.15–1.40)
Chloride: 99 mmol/L (ref 98–111)
Creatinine, Ser: 4.7 mg/dL — ABNORMAL HIGH (ref 0.61–1.24)
Glucose, Bld: 79 mg/dL (ref 70–99)
HCT: 40 % (ref 39.0–52.0)
Hemoglobin: 13.6 g/dL (ref 13.0–17.0)
Potassium: 4.3 mmol/L (ref 3.5–5.1)
Sodium: 140 mmol/L (ref 135–145)
TCO2: 26 mmol/L (ref 22–32)

## 2019-07-15 LAB — GLUCOSE, CAPILLARY
Glucose-Capillary: 151 mg/dL — ABNORMAL HIGH (ref 70–99)
Glucose-Capillary: 88 mg/dL (ref 70–99)

## 2019-07-15 SURGERY — TRANSPOSITION, VEIN, BASILIC
Anesthesia: General | Site: Arm Upper | Laterality: Left

## 2019-07-15 MED ORDER — PHENYLEPHRINE HCL-NACL 10-0.9 MG/250ML-% IV SOLN
INTRAVENOUS | Status: DC | PRN
  Administered 2019-07-15: 30 ug/min via INTRAVENOUS

## 2019-07-15 MED ORDER — HYDROCODONE-ACETAMINOPHEN 5-325 MG PO TABS
1.0000 | ORAL_TABLET | Freq: Once | ORAL | Status: AC
  Administered 2019-07-15: 1 via ORAL

## 2019-07-15 MED ORDER — ONDANSETRON HCL 4 MG/2ML IJ SOLN
INTRAMUSCULAR | Status: DC | PRN
  Administered 2019-07-15: 4 mg via INTRAVENOUS

## 2019-07-15 MED ORDER — HEPARIN SODIUM (PORCINE) 1000 UNIT/ML IJ SOLN
INTRAMUSCULAR | Status: AC
  Filled 2019-07-15: qty 1

## 2019-07-15 MED ORDER — DEXTROSE 50 % IV SOLN
INTRAVENOUS | Status: AC
  Filled 2019-07-15: qty 50

## 2019-07-15 MED ORDER — CISATRACURIUM BESYLATE 20 MG/10ML IV SOLN
INTRAVENOUS | Status: AC
  Filled 2019-07-15: qty 10

## 2019-07-15 MED ORDER — LIDOCAINE-EPINEPHRINE 1 %-1:100000 IJ SOLN
INTRAMUSCULAR | Status: AC
  Filled 2019-07-15: qty 1

## 2019-07-15 MED ORDER — FENTANYL CITRATE (PF) 100 MCG/2ML IJ SOLN: 25.0000 ug | INTRAMUSCULAR | Status: DC | PRN

## 2019-07-15 MED ORDER — PROPOFOL 10 MG/ML IV BOLUS
INTRAVENOUS | Status: AC
  Filled 2019-07-15: qty 20

## 2019-07-15 MED ORDER — FENTANYL CITRATE (PF) 250 MCG/5ML IJ SOLN
INTRAMUSCULAR | Status: AC
  Filled 2019-07-15: qty 5

## 2019-07-15 MED ORDER — 0.9 % SODIUM CHLORIDE (POUR BTL) OPTIME
TOPICAL | Status: DC | PRN
  Administered 2019-07-15: 1000 mL

## 2019-07-15 MED ORDER — VANCOMYCIN HCL IN DEXTROSE 1-5 GM/200ML-% IV SOLN
INTRAVENOUS | Status: AC
  Filled 2019-07-15: qty 200

## 2019-07-15 MED ORDER — DEXAMETHASONE SODIUM PHOSPHATE 10 MG/ML IJ SOLN
INTRAMUSCULAR | Status: DC | PRN
  Administered 2019-07-15: 4 mg via INTRAVENOUS

## 2019-07-15 MED ORDER — HYDROCODONE-ACETAMINOPHEN 5-325 MG PO TABS
ORAL_TABLET | ORAL | Status: AC
  Filled 2019-07-15: qty 1

## 2019-07-15 MED ORDER — PHENYLEPHRINE 40 MCG/ML (10ML) SYRINGE FOR IV PUSH (FOR BLOOD PRESSURE SUPPORT)
PREFILLED_SYRINGE | INTRAVENOUS | Status: DC | PRN
  Administered 2019-07-15 (×6): 80 ug via INTRAVENOUS

## 2019-07-15 MED ORDER — DEXAMETHASONE SODIUM PHOSPHATE 10 MG/ML IJ SOLN
INTRAMUSCULAR | Status: AC
  Filled 2019-07-15: qty 1

## 2019-07-15 MED ORDER — LIDOCAINE 2% (20 MG/ML) 5 ML SYRINGE
INTRAMUSCULAR | Status: DC | PRN
  Administered 2019-07-15: 100 mg via INTRAVENOUS

## 2019-07-15 MED ORDER — HYDROCODONE-ACETAMINOPHEN 5-325 MG PO TABS: 1.0000 | ORAL_TABLET | ORAL | 0 refills | Status: DC | PRN

## 2019-07-15 MED ORDER — FENTANYL CITRATE (PF) 100 MCG/2ML IJ SOLN
INTRAMUSCULAR | Status: DC | PRN
  Administered 2019-07-15: 50 ug via INTRAVENOUS
  Administered 2019-07-15 (×2): 25 ug via INTRAVENOUS

## 2019-07-15 MED ORDER — ONDANSETRON HCL 4 MG/2ML IJ SOLN
INTRAMUSCULAR | Status: AC
  Filled 2019-07-15: qty 2

## 2019-07-15 MED ORDER — SUCCINYLCHOLINE CHLORIDE 200 MG/10ML IV SOSY
PREFILLED_SYRINGE | INTRAVENOUS | Status: DC | PRN
  Administered 2019-07-15: 140 mg via INTRAVENOUS

## 2019-07-15 MED ORDER — MIDAZOLAM HCL 5 MG/5ML IJ SOLN
INTRAMUSCULAR | Status: DC | PRN
  Administered 2019-07-15: 2 mg via INTRAVENOUS

## 2019-07-15 MED ORDER — SODIUM CHLORIDE 0.9 % IV SOLN
INTRAVENOUS | Status: DC | PRN
  Administered 2019-07-15: 12:00:00

## 2019-07-15 MED ORDER — PROPOFOL 10 MG/ML IV BOLUS
INTRAVENOUS | Status: DC | PRN
  Administered 2019-07-15: 20 mg via INTRAVENOUS
  Administered 2019-07-15: 200 mg via INTRAVENOUS
  Administered 2019-07-15: 30 mg via INTRAVENOUS

## 2019-07-15 MED ORDER — SODIUM CHLORIDE 0.9 % IV SOLN
INTRAVENOUS | Status: AC
  Filled 2019-07-15: qty 1.2

## 2019-07-15 MED ORDER — LIDOCAINE 2% (20 MG/ML) 5 ML SYRINGE
INTRAMUSCULAR | Status: AC
  Filled 2019-07-15: qty 5

## 2019-07-15 MED ORDER — DEXTROSE 50 % IV SOLN
1.0000 | Freq: Once | INTRAVENOUS | Status: AC
  Administered 2019-07-15: 50 mL via INTRAVENOUS

## 2019-07-15 MED ORDER — SODIUM CHLORIDE 0.9 % IV SOLN
INTRAVENOUS | Status: DC
  Administered 2019-07-15 (×2): via INTRAVENOUS

## 2019-07-15 MED ORDER — MIDAZOLAM HCL 2 MG/2ML IJ SOLN
INTRAMUSCULAR | Status: AC
  Filled 2019-07-15: qty 2

## 2019-07-15 SURGICAL SUPPLY — 35 items
ARMBAND PINK RESTRICT EXTREMIT (MISCELLANEOUS) ×3 IMPLANT
CANISTER SUCT 3000ML PPV (MISCELLANEOUS) ×3 IMPLANT
CLIP VESOCCLUDE MED 24/CT (CLIP) ×3 IMPLANT
CLIP VESOCCLUDE MED 6/CT (CLIP) IMPLANT
CLIP VESOCCLUDE SM WIDE 24/CT (CLIP) ×3 IMPLANT
CLIP VESOCCLUDE SM WIDE 6/CT (CLIP) IMPLANT
COVER PROBE W GEL 5X96 (DRAPES) ×3 IMPLANT
COVER WAND RF STERILE (DRAPES) IMPLANT
DERMABOND ADVANCED (GAUZE/BANDAGES/DRESSINGS) ×4
DERMABOND ADVANCED .7 DNX12 (GAUZE/BANDAGES/DRESSINGS) ×2 IMPLANT
ELECT REM PT RETURN 9FT ADLT (ELECTROSURGICAL) ×3
ELECTRODE REM PT RTRN 9FT ADLT (ELECTROSURGICAL) ×1 IMPLANT
GLOVE BIO SURGEON STRL SZ7.5 (GLOVE) ×6 IMPLANT
GLOVE BIOGEL PI IND STRL 6 (GLOVE) ×2 IMPLANT
GLOVE BIOGEL PI IND STRL 6.5 (GLOVE) ×1 IMPLANT
GLOVE BIOGEL PI INDICATOR 6 (GLOVE) ×4
GLOVE BIOGEL PI INDICATOR 6.5 (GLOVE) ×2
GOWN STRL REUS W/ TWL LRG LVL3 (GOWN DISPOSABLE) ×1 IMPLANT
GOWN STRL REUS W/ TWL XL LVL3 (GOWN DISPOSABLE) ×2 IMPLANT
GOWN STRL REUS W/TWL LRG LVL3 (GOWN DISPOSABLE) ×2
GOWN STRL REUS W/TWL XL LVL3 (GOWN DISPOSABLE) ×4
KIT BASIN OR (CUSTOM PROCEDURE TRAY) ×3 IMPLANT
KIT TURNOVER KIT B (KITS) ×3 IMPLANT
NS IRRIG 1000ML POUR BTL (IV SOLUTION) ×3 IMPLANT
PACK CV ACCESS (CUSTOM PROCEDURE TRAY) ×3 IMPLANT
PAD ARMBOARD 7.5X6 YLW CONV (MISCELLANEOUS) ×6 IMPLANT
SUT MNCRL AB 4-0 PS2 18 (SUTURE) ×6 IMPLANT
SUT PROLENE 5 0 C 1 36 (SUTURE) ×3 IMPLANT
SUT PROLENE 6 0 BV (SUTURE) ×6 IMPLANT
SUT SILK 2 0 SH (SUTURE) IMPLANT
SUT VIC AB 3-0 SH 27 (SUTURE) ×4
SUT VIC AB 3-0 SH 27X BRD (SUTURE) ×2 IMPLANT
TOWEL GREEN STERILE (TOWEL DISPOSABLE) ×3 IMPLANT
UNDERPAD 30X30 (UNDERPADS AND DIAPERS) ×3 IMPLANT
WATER STERILE IRR 1000ML POUR (IV SOLUTION) ×3 IMPLANT

## 2019-07-15 NOTE — Anesthesia Postprocedure Evaluation (Signed)
Anesthesia Post Note  Patient: George Lewis.  Procedure(s) Performed: BASILIC VEIN TRANSPOSITION SECOND STAGE (Left Arm Upper)     Patient location during evaluation: PACU Anesthesia Type: General Level of consciousness: awake Pain management: pain level controlled Vital Signs Assessment: post-procedure vital signs reviewed and stable Respiratory status: spontaneous breathing Cardiovascular status: stable Postop Assessment: no apparent nausea or vomiting Anesthetic complications: no    Last Vitals:  Vitals:   07/15/19 1332 07/15/19 1345  BP: 101/66 (!) 105/59  Pulse: 90 88  Resp: 18 (!) 23  Temp: 36.7 C 36.7 C  SpO2: 100% 97%    Last Pain:  Vitals:   07/15/19 1354  TempSrc:   PainSc: 4                  Tracen Mahler

## 2019-07-15 NOTE — Anesthesia Procedure Notes (Addendum)
Procedure Name: Intubation Date/Time: 07/15/2019 11:52 AM Performed by: Jenne Campus, CRNA Pre-anesthesia Checklist: Patient identified, Emergency Drugs available, Suction available and Patient being monitored Patient Re-evaluated:Patient Re-evaluated prior to induction Oxygen Delivery Method: Circle system utilized Preoxygenation: Pre-oxygenation with 100% oxygen Induction Type: IV induction and Rapid sequence Laryngoscope Size: Glidescope and 4 Grade View: Grade I Tube type: Oral Tube size: 7.5 mm Number of attempts: 1 Airway Equipment and Method: Stylet and Oral airway Placement Confirmation: ETT inserted through vocal cords under direct vision,  positive ETCO2 and breath sounds checked- equal and bilateral Secured at: 23 cm Tube secured with: Tape Dental Injury: Teeth and Oropharynx as per pre-operative assessment

## 2019-07-15 NOTE — Progress Notes (Signed)
iStat 8 results w/Glucose 79; patient states his CBG at home this morning prior to coming to hospital was 86.  Dr. Gennie Alma, MD - Anesthesia, notified of both - new orders received for 1amp D50 IVP x one and recheck in 20 mins.  Patient is asymptomatic.  Will continue to monitor.

## 2019-07-15 NOTE — Transfer of Care (Signed)
Immediate Anesthesia Transfer of Care Note  Patient: George Lewis.  Procedure(s) Performed: BASILIC VEIN TRANSPOSITION SECOND STAGE (Left Arm Upper)  Patient Location: PACU  Anesthesia Type:General  Level of Consciousness: awake, oriented and patient cooperative  Airway & Oxygen Therapy: Patient Spontanous Breathing and Patient connected to face mask oxygen  Post-op Assessment: Report given to RN and Post -op Vital signs reviewed and stable  Post vital signs: Reviewed  Last Vitals:  Vitals Value Taken Time  BP 101/66 07/15/19 1332  Temp 36.7 C 07/15/19 1332  Pulse 91 07/15/19 1334  Resp 19 07/15/19 1334  SpO2 95 % 07/15/19 1334  Vitals shown include unvalidated device data.  Last Pain:  Vitals:   07/15/19 1018  TempSrc:   PainSc: 0-No pain         Complications: No apparent anesthesia complications

## 2019-07-15 NOTE — Op Note (Signed)
    Patient name: George Lewis. MRN: [id number] DOB: May 06, 1956 Sex: male  07/15/2019 Pre-operative Diagnosis: End-stage renal disease Post-operative diagnosis:  Same Surgeon:  Erlene Quan C. Donzetta Matters, MD Assistant: Arlee Muslim, PA Procedure Performed:  Left arm second stage basilic vein transposition fistula creation  Indications: 63 year old male with end-stage renal disease on dialysis via catheter.  He has a previous placed left sided first stage basilic vein fistula.  He is now indicated for second stage with transposition.  Findings: Fistula itself was thick throughout approximately 1 cm diameter.  At completion there was a very strong thrill and a palpable radial pulse at the wrist.  This fistula can be cannulated in 4 weeks from this procedure   Procedure:  The patient was identified in the holding area and taken to the operating room where is placed supine operative table in LMA anesthesia induced.  He was sterilely prepped and draped in the left upper extremity in the usual fashion antibiotics were administered and a timeout was called.  We began by opening the previous incision just above the antecubitum.  I dissected down to the fistula.  I then made 2 separate skip incisions to the axilla.  Branches were taken between clips and ties.  When the fistula was fully freed up we marked it for orientation.  I clamped it near the antecubitum and also in the axilla and transected it near the antecubitum.  I then tunneled it medially on the arm just under the surface of the skin.  Flushed with heparinized saline.  I spatulated both ends and sewed them end-to-end with 6-0 Prolene suture.  Prior completion anastomosis we allowed flushing all direction.  Upon completion there was good thrill in the fistula.  There was good radial artery pulse at the wrist confirmed with Doppler.  Satisfied we irrigated the wounds obtain hemostasis.  We closed them with Vicryl and Monocryl and Dermabond placed to the  level the skin.  He is awake anesthesia having tolerated procedure well immediate complication needle counts were correct at completion.  EBL: 50 cc   Brandon C. Donzetta Matters, MD [facility] Office: [phone] Pager: [phone]

## 2019-07-15 NOTE — H&P (Signed)
   History and Physical Update  The patient was interviewed and re-examined.  The patient's previous History and Physical has been reviewed and is unchanged from recent office visit. Plan for left second stage bvt.   Arber Wiemers C. Donzetta Matters, MD [facility] Office: [phone] Pager: [phone]  07/15/2019, 9:51 AM

## 2019-07-15 NOTE — Discharge Instructions (Signed)
° °  [facility] ° °Discharge Instructions ° °AV Fistula or Graft Surgery for Dialysis Access ° °Please refer to the following instructions for your post-procedure care. Your surgeon or physician assistant will discuss any changes with you. ° °Activity ° °You may drive the day following your surgery, if you are comfortable and no longer taking prescription pain medication. Resume full activity as the soreness in your incision resolves. ° °Bathing/Showering ° °You may shower after you go home. Keep your incision dry for 48 hours. Do not soak in a bathtub, hot tub, or swim until the incision heals completely. You may not shower if you have a hemodialysis catheter. ° °Incision Care ° °Clean your incision with mild soap and water after 48 hours. Pat the area dry with a clean towel. You do not need a bandage unless otherwise instructed. Do not apply any ointments or creams to your incision. You may have skin glue on your incision. Do not peel it off. It will come off on its own in about one week. Your arm may swell a bit after surgery. To reduce swelling use pillows to elevate your arm so it is above your heart. Your doctor will tell you if you need to lightly wrap your arm with an ACE bandage. ° °Diet ° °Resume your normal diet. There are not special food restrictions following this procedure. In order to heal from your surgery, it is CRITICAL to get adequate nutrition. Your body requires vitamins, minerals, and protein. Vegetables are the best source of vitamins and minerals. Vegetables also provide the perfect balance of protein. Processed food has little nutritional value, so try to avoid this. ° °Medications ° °Resume taking all of your medications. If your incision is causing pain, you may take over-the counter pain relievers such as acetaminophen (Tylenol). If you were prescribed a stronger pain medication, please be aware these medications can cause nausea and constipation. Prevent  nausea by taking the medication with a snack or meal. Avoid constipation by drinking plenty of fluids and eating foods with high amount of fiber, such as fruits, vegetables, and grains. Do not take Tylenol if you are taking prescription pain medications. ° ° ° ° °Follow up °Your surgeon may want to see you in the office following your access surgery. If so, this will be arranged at the time of your surgery. ° °Please call us immediately for any of the following conditions: ° °Increased pain, redness, drainage (pus) from your incision site °Fever of 101 degrees or higher °Severe or worsening pain at your incision site °Hand pain or numbness. ° °Reduce your risk of vascular disease: ° °Stop smoking. If you would like help, call [facility] at 1-800-QUIT-NOW (1-800-784-8669) or [place] at 336-586-4000 ° °Manage your cholesterol °Maintain a desired weight °Control your diabetes °Keep your blood pressure down ° °Dialysis ° °It will take several weeks to several months for your new dialysis access to be ready for use. Your surgeon will determine when it is OK to use it. Your nephrologist will continue to direct your dialysis. You can continue to use your Permcath until your new access is ready for use. ° °If you have any questions, please call the office at [phone]. ° °

## 2019-07-16 ENCOUNTER — Encounter (HOSPITAL_COMMUNITY): Payer: Self-pay | Admitting: Vascular Surgery

## 2019-07-26 ENCOUNTER — Telehealth: Payer: Self-pay | Admitting: Cardiology

## 2019-07-26 NOTE — Telephone Encounter (Signed)
The patient's wife called in with concerns of patient having had a near syncopal episode yesterday at dialysis.  She provided the dialysis nurse phone number.  I called and spoke to Georgia at [facility] in [place] [phone] extension [phone].  Apparently just after dialysis during vital signs the patient's blood pressure dropped from 1 35-90 with standing.  His eyes rolled back in his head was twitching.  They sent him back down and gave him 1 L of fluids.  The nurse reports that his heart rate was irregular and around 140.  After being seated his blood pressure improved and heart rate was 86 and regular.  She does note that he had some shortness of breath with exertion and fatigue on the day before and she was concerned that he was having episodes of in and out of A. Fib.  The patient was advised to go to that hospital but he refused. The patient's wife says that he is feeling well today with blood pressure 112/51 last night and 107/63 this morning with heart rate of 76. Upon review of last office note with the A. fib clinic, amiodarone had been stopped with plans to restart it if the patient had a recurrent episode.  The patient continues to be anticoagulated with warfarin. I have advised the patient's wife to restart amiodarone 200 mg daily and I will send this note to the A. fib clinic for review on Monday morning.  I also encouraged her to ensure that Mr. Dimmer has adequate fluid intake. We reviewed signs and symptoms to alert her to seek urgent medical attention including low blood pressure, high heart rate with symptoms and patient, chest discomfort, shortness of breath, lightheadedness, passing out.  She states that she understands.

## 2019-07-30 ENCOUNTER — Ambulatory Visit (INDEPENDENT_AMBULATORY_CARE_PROVIDER_SITE_OTHER): Payer: PRIVATE HEALTH INSURANCE | Admitting: *Deleted

## 2019-07-30 ENCOUNTER — Other Ambulatory Visit: Payer: Self-pay

## 2019-07-30 DIAGNOSIS — I4891 Unspecified atrial fibrillation: Secondary | ICD-10-CM

## 2019-07-30 DIAGNOSIS — Z7901 Long term (current) use of anticoagulants: Secondary | ICD-10-CM | POA: Diagnosis not present

## 2019-07-30 LAB — POCT INR: INR: 2.5 (ref 2.0–3.0)

## 2019-07-30 NOTE — Patient Instructions (Signed)
11/27  Started Amiodarone 200mg  daily Continue taking 1.5 tablets daily except 1 tablet each Tuesday and Thursday.  Repeat INR in 1 week due to amiodarone

## 2019-07-31 ENCOUNTER — Telehealth: Payer: Self-pay | Admitting: Pulmonary Disease

## 2019-07-31 ENCOUNTER — Telehealth: Payer: Self-pay | Admitting: Internal Medicine

## 2019-07-31 NOTE — Telephone Encounter (Signed)
Patient has a CT and an appt with Dr. Margaretann Loveless on 08/05/19. Patient's wife, George Lewis, states that the patient has a spot on his lung which is requiring him to have the CT done with dye. She states that recently, since Thanksgiving, that the patient has been having issues with afib again. He was kept on meds for this until September 24th then taken off. But recently he has been having to stop in the middle of his dialysis treatments and given fluid and then over thanksgiving break he stood up twice and twtiched, his eyes roll back and then fall back in his chair. She is skeptical on whether or not he should go to his CT appt before being seen by Dr. Margaretann Loveless or have the CT rescheduled.

## 2019-07-31 NOTE — Telephone Encounter (Signed)
Spoke with the pt's spouse  Pt is currently scheduled for PET scan on 08/05/19 and appt with his cardiologist right after  She is concerned about him having PET before he sees his cardiologist bc he has been having issues with passing out after dialysis and has been having "convulsions"- she is thinking this is related to his A fib  She wants to know if we need to reschedule PET  Please advise thanks!

## 2019-07-31 NOTE — Telephone Encounter (Signed)
Spoke with the pt's spouse  She is now ok with him keeping the appt and will call back if she changes her mind  Nothing further needed

## 2019-07-31 NOTE — Telephone Encounter (Signed)
Will forward to Dr Margaretann Loveless to review to see if pt should reschedule pet scan til after appt with her See message below .

## 2019-07-31 NOTE — Telephone Encounter (Signed)
PCCM:  I am ok to reschedule the PET. However would like to have it done with in 1-2 weeks of the previously scheduled date.   Garner Nash, DO [facility] 07/31/2019 3:18 PM

## 2019-08-05 ENCOUNTER — Ambulatory Visit: Payer: PRIVATE HEALTH INSURANCE | Admitting: Internal Medicine

## 2019-08-05 ENCOUNTER — Encounter (HOSPITAL_COMMUNITY)
Admission: RE | Admit: 2019-08-05 | Discharge: 2019-08-05 | Disposition: A | Discharge status: Home / Self Care | Payer: PRIVATE HEALTH INSURANCE | Source: Ambulatory Visit | Attending: Pulmonary Disease | Admitting: Pulmonary Disease

## 2019-08-05 ENCOUNTER — Other Ambulatory Visit: Payer: Self-pay

## 2019-08-05 ENCOUNTER — Encounter: Payer: Self-pay | Admitting: Internal Medicine

## 2019-08-05 ENCOUNTER — Telehealth: Payer: Self-pay | Admitting: Pulmonary Disease

## 2019-08-05 VITALS — BP 110/68 | HR 98 | Temp 97.5°F | Ht 73.0 in | Wt 280.0 lb

## 2019-08-05 DIAGNOSIS — N186 End stage renal disease: Secondary | ICD-10-CM | POA: Diagnosis not present

## 2019-08-05 DIAGNOSIS — I4891 Unspecified atrial fibrillation: Secondary | ICD-10-CM

## 2019-08-05 DIAGNOSIS — R55 Syncope and collapse: Secondary | ICD-10-CM | POA: Diagnosis not present

## 2019-08-05 DIAGNOSIS — R918 Other nonspecific abnormal finding of lung field: Secondary | ICD-10-CM | POA: Diagnosis present

## 2019-08-05 DIAGNOSIS — Z7901 Long term (current) use of anticoagulants: Secondary | ICD-10-CM

## 2019-08-05 DIAGNOSIS — Z992 Dependence on renal dialysis: Secondary | ICD-10-CM

## 2019-08-05 LAB — GLUCOSE, CAPILLARY: Glucose-Capillary: 90 mg/dL (ref 70–99)

## 2019-08-05 MED ORDER — FLUDEOXYGLUCOSE F - 18 (FDG) INJECTION: 13.5200 | Freq: Once | INTRAVENOUS | Status: DC | PRN

## 2019-08-05 NOTE — Telephone Encounter (Signed)
Received call from [facility] with [facility].  She wanted to make sure that our provider views the impression from today's PET. Routing to Dr. Valeta Harms.   "IMPRESSION: 1. No metabolic activity associated with the lingular nodule. 2. Intense activity in the head of the pancreas associates with the small ovoid intermediate lesion. 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 Recommend endoscopic ultrasound for evaluation and potential sampling 3. Intense metabolic activity associated with a fatty peritoneal lesion beneath the umbilicus. Differential would include postsurgical fatty inflammation versus inflamed epiploic appendage. Metastatic disease is felt unlikely. 4. 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  These results will be called to the ordering clinician or representative by the Radiologist Assistant, and communication documented in the PACS or zVision Dashboard."

## 2019-08-05 NOTE — Progress Notes (Signed)
Cardiology Office Note:    Date:  08/05/2019   ID:  George Mead., DOB Apr 03, 1956, MRN [id number]  PCP:  Esaw Grandchild, NP  Cardiologist:  Elouise Munroe, MD  Electrophysiologist:  None   Referring MD: Esaw Grandchild, NP   Chief Complaint: Presyncopal episode, afib  History of Present Illness:    George Hanton. is a 63 y.o. male with a hx of right renal cell carcinomas/pnephrectomy in 9/19, ESRD on HD(TTS), HTN,HLD,SVT, DM type II, OSA on CPAP,morbid obesity, and paroxysmal atrial fibrillation.  He had a recent episode of near syncope during dialysis. Please see J. Phylliss Bob NP telephone encounter note 07/26/2019.  The patient's wife called into the on-call cardiology line noting that the patient had an episode of orthostatic hypotension after dialysis where his eyes rolled back in his head and he was twitching without frank seizure activity.  He was given a 1 L bolus and his heart rate was noted to be irregular and rapid into the 140s.  His heart rate improved after he was seated and his blood pressure normalized.  He is having some shortness of breath with exertion around that time as well.  Given concerns of recurrent A. fib he was restarted on amiodarone 200 mg daily.  He was also due to have a PET scan for a lung nodule requiring further work-up, this is being coordinated by pulmonary medicine.  His wife was concerned if he would be able to undergo this PET scan given that he has had issues with symptomatic hypotension and A. fib during dialysis.  Per the last A. fib clinic note, he was noted to convert to sinus rhythm on amiodarone and his A. fib was felt to be secondary to infection.  His amiodarone was stopped after 30-day course and recommended for reinitiation if A. fib recurred.  Overall he feels well today but is concerned about the follow-up after PET scan and implications of findings on PET.  He has an upcoming appointment with pulmonary medicine where this  can be discussed further.  Past Medical History:  Diagnosis Date   Anemia    Arthritis    knees   Atrial fibrillation ([place])    Bilateral renal cysts 03/23/2018   Noted MRI ABD   Cancer ([place])    right kidney cancer   Cholelithiasis 03/19/2018   noted on CT AB/Pelvis   CKD (chronic kidney disease), stage IV [facility])    nephrologist-- dr Hollie Salk [name]);  05-01-2018 has not started dialysis, scheduled for AV fistula creation 05-07-2018   Diabetes mellitus without complication ([place])    Diverticulosis of colon 04/19/2018   Noted on CT abd/pelvis   GERD (gastroesophageal reflux disease)    Hepatic steatosis 03/19/2018   Mild diffuse, noted on CT AB/Pelvis   History of kidney stones    History of pulmonary embolus (PE) 03/2008   treated with coumadin for 6 months   History of sepsis 04/20/2018   per discharge note , probable UTI   Hypertension    IBS (irritable bowel syndrome)    Nocturia    OSA on CPAP    uses CPAP nightly   Pancreatic lesion    0.4cm cystic per CT 07/ 2019   Peripheral vascular disease ([facility])    Pneumonia    x 1   Pulmonary nodule    Solid 91m Right Lower Lobe   Right renal mass 04/10/2018   new dx--  scheduled for nephrectomy 05-16-2018   S/P  ureteral stent placement: 04/16/2018 04/19/2018    Past Surgical History:  Procedure Laterality Date   AV FISTULA PLACEMENT Left 05/07/2018   Procedure: Creation of Left arm Radiocephalic Fistula;  Surgeon: Marty Heck, MD;  Location: [place];  Service: Vascular;  Laterality: Left;   AV FISTULA PLACEMENT Left 05/15/2019   Procedure: Creation of Brachiocephalic fistula left arm;  Surgeon: Waynetta Sandy, MD;  Location: [place];  Service: Vascular;  Laterality: Left;   [place] Left 07/15/2019   Procedure: BASILIC VEIN TRANSPOSITION SECOND STAGE;  Surgeon: Waynetta Sandy, MD;  Location: [place];  Service: Vascular;  Laterality: Left;    COLONOSCOPY     CYSTOSCOPY/RETROGRADE/URETEROSCOPY/STONE EXTRACTION WITH BASKET  x2 last one 1990s approx.   CYSTOSCOPY/URETEROSCOPY/HOLMIUM LASER/STENT PLACEMENT Left 04/16/2018   Procedure: CYSTOSCOPY/LEFT URETEROSCOPY/LEFT RETROGRADE/STENT PLACEMENT;  Surgeon: Lucas Mallow, MD;  Location: [facility];  Service: Urology;  Laterality: Left;   EUS N/A 05/03/2018   Procedure: UPPER ENDOSCOPIC ULTRASOUND (EUS) RADIAL;  Surgeon: Milus Banister, MD;  [facility];  Service: Gastroenterology;  Laterality: N/A;   EUS N/A 05/03/2018   Procedure: UPPER ENDOSCOPIC ULTRASOUND (EUS) LINEAR;  Surgeon: Milus Banister, MD;  [facility];  Service: Gastroenterology;  Laterality: N/A;   EXTRACORPOREAL SHOCK WAVE LITHOTRIPSY  x3  last one 2004 approx.   FINE NEEDLE ASPIRATION N/A 05/03/2018   Procedure: FINE NEEDLE ASPIRATION (FNA) LINEAR;  Surgeon: Milus Banister, MD;  [facility];  Service: Gastroenterology;  Laterality: N/A;   LAPAROSCOPIC NEPHRECTOMY, HAND ASSISTED Right 05/16/2018   Procedure: HAND ASSISTED LAPAROSCOPIC RIGHT NEPHRECTOMY;  Surgeon: Lucas Mallow, MD;  Location: [facility];  Service: Urology;  Laterality: Right;   left index finger attachment  1980s   3-4 surgeries   TOE SURGERY  1980s   in beween 2nd and 3rd toes cyst removed   UPPER GI ENDOSCOPY      Current Medications: Current Meds  Medication Sig   acetaminophen (TYLENOL) 500 MG tablet Take 1,000 mg by mouth every 6 (six) hours as needed for mild pain or headache.    allopurinol (ZYLOPRIM) 100 MG tablet Take 1 tablet (100 mg total) by mouth every evening.   aspirin EC 81 MG EC tablet Take 1 tablet (81 mg total) by mouth daily. (Patient taking differently: Take 81 mg by mouth every evening. )   atorvastatin (LIPITOR) 40 MG tablet Take 1 tablet (40 mg total) by mouth daily. (Patient taking differently: Take 40 mg by mouth every evening. )   blood glucose meter kit and supplies Dispense  based on patient and insurance preference. Use up to four times daily as directed. (FOR ICD-10 E10.9, E11.9).   diphenhydramine-acetaminophen (TYLENOL PM) 25-500 MG TABS tablet Take 2 tablets by mouth at bedtime as needed (sleep).   fluticasone (FLONASE) 50 MCG/ACT nasal spray Place 1-2 sprays into both nostrils daily as needed for allergies or rhinitis.    HYDROcodone-acetaminophen (NORCO) 5-325 MG tablet Take 1 tablet by mouth every 4 (four) hours as needed for moderate pain. (Patient not taking: Reported on 08/09/2019)   LANTUS SOLOSTAR 100 UNIT/ML Solostar Pen Inject 18 Units into the skin at bedtime.    loratadine (CLARITIN) 10 MG tablet Take 10 mg by mouth every evening.    metoprolol tartrate (LOPRESSOR) 25 MG tablet Take 25 mg by mouth daily as needed (for elevated blood pressure (greater than 130)).   nitroGLYCERIN (NITROSTAT) 0.4 MG SL tablet Place 1 tablet (0.4 mg total)  under the tongue every 5 (five) minutes as needed for chest pain. (Patient not taking: Reported on 08/09/2019)   omeprazole (PRILOSEC OTC) 20 MG tablet Take 40 mg by mouth daily before breakfast.    tamsulosin (FLOMAX) 0.4 MG CAPS capsule Take 0.4 mg by mouth every evening.    warfarin (COUMADIN) 3 MG tablet Take 1 tablet (3 mg total) by mouth every evening. Take as directed by [facility] (Patient taking differently: Take 3-4.5 mg by mouth See admin instructions. Take 1 tablet (3 mg) by mouth on Tuesdays & Thursdays. Take 1.5 tablets (4.5 mg) by mouth on Mondays, Wednesdays, Fridays, Saturdays & Sundays.)   amiodarone (PACERONE) 200 MG tablet Take 200 mg by mouth daily.     Allergies:   Penicillins   Social History   Socioeconomic History   Marital status: Married    Spouse name: Not on file   Number of children: Not on file   Years of education: Not on file   Highest education level: Not on file  Occupational History   Occupation: disabled  Tobacco Use   Smoking status: Never Smoker    Smokeless tobacco: Never Used  Substance and Sexual Activity   Alcohol use: Never   Drug use: Never   Sexual activity: Not on file  Other Topics Concern   Not on file  Social History Narrative   Not on file   Social Determinants of Health   Financial Resource Strain:    Difficulty of Paying Living Expenses: Not on file  Food Insecurity:    Worried About [place] in the Last Year: Not on file   Ran Out of Food in the Last Year: Not on file  Transportation Needs:    Lack of Transportation (Medical): Not on file   Lack of Transportation (Non-Medical): Not on file  Physical Activity:    Days of Exercise per Week: Not on file   Minutes of Exercise per Session: Not on file  Stress:    Feeling of Stress : Not on file  Social Connections:    Frequency of Communication with Friends and Family: Not on file   Frequency of Social Gatherings with Friends and Family: Not on file   Attends Religious Services: Not on file   Active Member of Clubs or Organizations: Not on file   Attends Archivist Meetings: Not on file   Marital Status: Not on file     Family History: The patient's family history includes Alzheimer's disease in his father and mother; Cancer - Other in his sister; Heart attack in his father; Heart disease in his father.  ROS:   Please see the history of present illness.    All other systems reviewed and are negative.  EKGs/Labs/Other Studies Reviewed:    The following studies were reviewed today:  EKG:  NSR  Recent Labs: 03/06/2019: B Natriuretic Peptide 34.8 04/21/2019: TSH 1.167 04/23/2019: ALT 14; Magnesium 1.9; Platelets 209 07/15/2019: BUN 54; Creatinine, Ser 4.70; Hemoglobin 13.6; Potassium 4.3; Sodium 140  Recent Lipid Panel    Component Value Date/Time   CHOL 183 02/25/2019 0918   TRIG 135 02/25/2019 0918   HDL 25 (L) 02/25/2019 0918   CHOLHDL 7.3 (H) 02/25/2019 0918   LDLCALC 131 (H) 02/25/2019 0918    Physical  Exam:    VS:  BP 110/68 (BP Location: Right Wrist, Patient Position: Sitting, Cuff Size: Large)    Pulse 98    Temp (!) 97.5 F (36.4 C)  Ht '6\' 1"'  (1.854 m)    Wt 280 lb (127 kg)    BMI 36.94 kg/m     Wt Readings from Last 5 Encounters:  08/09/19 274 lb (124.3 kg)  08/07/19 277 lb 12.8 oz (126 kg)  08/05/19 280 lb (127 kg)  07/15/19 268 lb (121.6 kg)  06/28/19 271 lb 14.4 oz (123.3 kg)     Constitutional: No acute distress Eyes: sclera non-icteric, normal conjunctiva and lids ENMT: normal dentition, moist mucous membranes Cardiovascular: regular rhythm, normal rate, no murmurs. S1 and S2 normal. Radial pulses normal bilaterally. No jugular venous distention.  Respiratory: clear to auscultation bilaterally GI : normal bowel sounds, soft and nontender. No distention.   MSK: extremities warm, well perfused. No edema.  NEURO: grossly nonfocal exam, moves all extremities. PSYCH: alert and oriented x 3, normal mood and affect.   ASSESSMENT:    1. Near syncope   2. Atrial fibrillation, unspecified type ([place])   3. Long term (current) use of anticoagulants   4. ESRD on hemodialysis [facility])    PLAN:    Near syncope -volume management per dialysis, however I have encouraged the patient not to get dehydrated as I have seen him in the hospital for syncope previously which was felt to be neurocardiogenic as well.  Orthostatic vital signs are normal today.  Atrial fibrillation, paroxysmal-continue on amiodarone 200 mg daily and follow-up with A. fib clinic in 2 to 3 months to revisit amiodarone use versus alternate antiarrhythmic drug.  With his history of OSA, and volume shifts as a possible provoking factor, he may continue to have paroxysms of A. fib that seems to be relatively symptomatic.  A rhythm control strategy seems very appropriate.  Long-term anticoagulation for A. fib-continue warfarin, no issues with bleeding endorsed by the patient.  Hyperlipidemia-lipids from June are above  goal, LDL 131.  In July his atorvastatin was increased to 40 mg daily, recommend repeat lipid panel with PCP or at next visit to optimize therapy further.  TIME SPENT WITH PATIENT: 25 minutes of direct patient care. More than 50% of that time was spent on coordination of care and counseling regarding the above-mentioned issues.  Cherlynn Kaiser, MD [place]   [facility]   Medication Adjustments/Labs and Tests Ordered: Current medicines are reviewed at length with the patient today.  Concerns regarding medicines are outlined above.  No orders of the defined types were placed in this encounter.  No orders of the defined types were placed in this encounter.   Patient Instructions  Medication Instructions:  Continue your Amiodarone and other medications as directed  *If you need a refill on your cardiac medications before your next appointment, please call your pharmacy*  Lab Work: NONE   Testing/Procedures: NONE  Follow-Up: At [facility], you and your health needs are our priority.  As part of our continuing mission to provide you with exceptional heart care, we have created designated Provider Care Teams.  These Care Teams include your primary Cardiologist (physician) and Advanced Practice Providers (APPs -  Physician Assistants and Nurse Practitioners) who all work together to provide you with the care you need, when you need it.  Your next appointment:   6 month(s)  The format for your next appointment:   In Person  Provider:   You may see Elouise Munroe, MD or one of the following Advanced Practice Providers on your designated Care Team:    Rosaria Ferries, PA-C  Jory Sims, DNP, ANP  Cadence Kathlen Mody,  NP   Your physician recommends that you schedule a follow-up appointment in: [facility] IN 2-3 MONTHS, IN PERSON

## 2019-08-05 NOTE — Patient Instructions (Signed)
Medication Instructions:  Continue your Amiodarone and other medications as directed  *If you need a refill on your cardiac medications before your next appointment, please call your pharmacy*  Lab Work: NONE   Testing/Procedures: NONE  Follow-Up: At [facility], you and your health needs are our priority.  As part of our continuing mission to provide you with exceptional heart care, we have created designated Provider Care Teams.  These Care Teams include your primary Cardiologist (physician) and Advanced Practice Providers (APPs -  Physician Assistants and Nurse Practitioners) who all work together to provide you with the care you need, when you need it.  Your next appointment:   6 month(s)  The format for your next appointment:   In Person  Provider:   You may see George Munroe, MD or one of the following Advanced Practice Providers on your designated Care Team:    Rosaria Ferries, PA-C  Jory Sims, DNP, ANP  Cadence Kathlen Mody, NP   Your physician recommends that you schedule a follow-up appointment in: [facility] CLINIC IN 2-3 MONTHS, IN PERSON

## 2019-08-06 ENCOUNTER — Ambulatory Visit (INDEPENDENT_AMBULATORY_CARE_PROVIDER_SITE_OTHER): Payer: PRIVATE HEALTH INSURANCE | Admitting: *Deleted

## 2019-08-06 DIAGNOSIS — I4891 Unspecified atrial fibrillation: Secondary | ICD-10-CM

## 2019-08-06 DIAGNOSIS — Z7901 Long term (current) use of anticoagulants: Secondary | ICD-10-CM

## 2019-08-06 LAB — POCT INR: INR: 2.3 (ref 2.0–3.0)

## 2019-08-06 NOTE — Telephone Encounter (Signed)
PCCM: Thanks we will review in office tomorrow.  Garner Nash, DO [facility] 08/06/2019 12:57 PM

## 2019-08-06 NOTE — Patient Instructions (Signed)
11/27  Started Amiodarone 200mg  daily Continue taking 1.5 tablets daily except 1 tablet each Tuesday and Thursday.  Repeat INR in 3 week due to amiodarone

## 2019-08-07 ENCOUNTER — Ambulatory Visit (INDEPENDENT_AMBULATORY_CARE_PROVIDER_SITE_OTHER): Payer: PRIVATE HEALTH INSURANCE | Admitting: Pulmonary Disease

## 2019-08-07 ENCOUNTER — Other Ambulatory Visit: Payer: Self-pay

## 2019-08-07 ENCOUNTER — Encounter: Payer: Self-pay | Admitting: Pulmonary Disease

## 2019-08-07 VITALS — BP 110/84 | HR 88 | Temp 97.8°F | Ht 73.0 in | Wt 277.8 lb

## 2019-08-07 DIAGNOSIS — N186 End stage renal disease: Secondary | ICD-10-CM | POA: Diagnosis not present

## 2019-08-07 DIAGNOSIS — Z85528 Personal history of other malignant neoplasm of kidney: Secondary | ICD-10-CM

## 2019-08-07 DIAGNOSIS — R948 Abnormal results of function studies of other organs and systems: Secondary | ICD-10-CM

## 2019-08-07 DIAGNOSIS — R918 Other nonspecific abnormal finding of lung field: Secondary | ICD-10-CM | POA: Diagnosis not present

## 2019-08-07 DIAGNOSIS — R911 Solitary pulmonary nodule: Secondary | ICD-10-CM | POA: Diagnosis not present

## 2019-08-07 DIAGNOSIS — K8689 Other specified diseases of pancreas: Secondary | ICD-10-CM

## 2019-08-07 DIAGNOSIS — Z992 Dependence on renal dialysis: Secondary | ICD-10-CM

## 2019-08-07 NOTE — Patient Instructions (Signed)
Thank you for visiting Dr. Valeta Harms at [facility]. Today we recommend the following:  Orders Placed This Encounter  Procedures  . CT CHEST WO CONTRAST  . Ambulatory referral to Gastroenterology   Return in about 6 months (around 02/05/2020) for with APP or Dr. Valeta Harms.    Please do your part to reduce the spread of COVID-19.

## 2019-08-07 NOTE — Progress Notes (Signed)
Synopsis: Referred in October 2020 for lung nodule by Esaw Grandchild, NP  Subjective:   [name]. GENDER: male DOB: 1956-03-12, MRN: [id number]  Chief Complaint  Patient presents with  . Follow-up    PET scan 07/06/2019. Reports no new concerns.     Patient with a past medical history of atrial fibrillation on Coumadin, OSA on CPAP, diabetes, CKD.  Patient seen today in consultation for lingular lung nodule.  Patient has a past medical history of renal cell carcinoma diagnosed in 2019 status post nephrectomy.  Subsequent scans have been stable.  Did find a lingular lung nodule in January 2020.  Subsequent CT imaging follow-up in May showed enlargement to 9 x 8 mm.  Subsequent imaging in July as well as recently in October 2020 both demonstrated stability of the nodule at 9 x 8 mm.  He is also on dialysis Tuesday Thursday Saturday.  He has a recent fistula failure with a tunneled cath currently in place and plans for AV fistula revision.  Overall very worried and concerned about the lung nodule.  He has been told that it could represent spread of his previous cancer.  Today we reviewed the images in the office.  Otherwise he has no symptoms.  No respiratory symptoms.  Lifelong non-smoker.  Lifelong no alcohol use.  Patient denies hemoptysis fevers chills night sweats weight loss.  OV 08/07/2019: Patient here for follow-up after PET scan imaging.  Lung nodule on PET scan was non-PET avid however there is a lesion found within the pancreas.  Discussed this today in the office.  Patient has no complaints from respiratory standpoint today.  He is recently underwent a new fistula revision with Dr. Kasandra Knudsen.  Going back to see him next week.  Still has his tunneled catheter.  Going to dialysis Tuesday Thursday Saturday.   Past Medical History:  Diagnosis Date  . Anemia   . Arthritis    knees  . Atrial fibrillation ([place])   . Bilateral renal cysts 03/23/2018   Noted MRI ABD  .  Cancer [facility])    right kidney cancer  . Cholelithiasis 03/19/2018   noted on CT AB/Pelvis  . CKD (chronic kidney disease), stage IV [facility])    nephrologist-- dr Hollie Salk [name]);  05-01-2018 has not started dialysis, scheduled for AV fistula creation 05-07-2018  . Diabetes mellitus without complication ([place])   . Diverticulosis of colon 04/19/2018   Noted on CT abd/pelvis  . GERD (gastroesophageal reflux disease)   . Hepatic steatosis 03/19/2018   Mild diffuse, noted on CT AB/Pelvis  . History of kidney stones   . History of pulmonary embolus (PE) 03/2008   treated with coumadin for 6 months  . History of sepsis 04/20/2018   per discharge note , probable UTI  . Hypertension   . IBS (irritable bowel syndrome)   . Nocturia   . OSA on CPAP    uses CPAP nightly  . Pancreatic lesion    0.4cm cystic per CT 07/ 2019  . Peripheral vascular disease ([place])   . Pneumonia    x 1  . Pulmonary nodule    Solid 27m Right Lower Lobe  . Right renal mass 04/10/2018   new dx--  scheduled for nephrectomy 05-16-2018  . S/P ureteral stent placement: 04/16/2018 04/19/2018     Family History  Problem Relation Age of Onset  . Alzheimer's disease Mother   . Alzheimer's disease Father   . Heart disease Father   .  Heart attack Father   . Cancer - Other Sister      Past Surgical History:  Procedure Laterality Date  . AV FISTULA PLACEMENT Left 05/07/2018   Procedure: Creation of Left arm Radiocephalic Fistula;  Surgeon: Marty Heck, MD;  Location: [place];  Service: Vascular;  Laterality: Left;  . AV FISTULA PLACEMENT Left 05/15/2019   Procedure: Creation of Brachiocephalic fistula left arm;  Surgeon: Waynetta Sandy, MD;  Location: [place];  Service: Vascular;  Laterality: Left;  . BASCILIC VEIN TRANSPOSITION Left 07/15/2019   Procedure: BASILIC VEIN TRANSPOSITION SECOND STAGE;  Surgeon: Waynetta Sandy, MD;  Location: [place];  Service: Vascular;  Laterality: Left;  .  COLONOSCOPY    . CYSTOSCOPY/RETROGRADE/URETEROSCOPY/STONE EXTRACTION WITH BASKET  x2 last one 1990s approx.  . CYSTOSCOPY/URETEROSCOPY/HOLMIUM LASER/STENT PLACEMENT Left 04/16/2018   Procedure: CYSTOSCOPY/LEFT URETEROSCOPY/LEFT RETROGRADE/STENT PLACEMENT;  Surgeon: Lucas Mallow, MD;  Location: [facility];  Service: Urology;  Laterality: Left;  . EUS N/A 05/03/2018   Procedure: UPPER ENDOSCOPIC ULTRASOUND (EUS) RADIAL;  Surgeon: Milus Banister, MD;  [facility];  Service: Gastroenterology;  Laterality: N/A;  . EUS N/A 05/03/2018   Procedure: UPPER ENDOSCOPIC ULTRASOUND (EUS) LINEAR;  Surgeon: Milus Banister, MD;  [facility];  Service: Gastroenterology;  Laterality: N/A;  . EXTRACORPOREAL SHOCK WAVE LITHOTRIPSY  x3  last one 2004 approx.  Marland Kitchen FINE NEEDLE ASPIRATION N/A 05/03/2018   Procedure: FINE NEEDLE ASPIRATION (FNA) LINEAR;  Surgeon: Milus Banister, MD;  [facility];  Service: Gastroenterology;  Laterality: N/A;  . LAPAROSCOPIC NEPHRECTOMY, HAND ASSISTED Right 05/16/2018   Procedure: HAND ASSISTED LAPAROSCOPIC RIGHT NEPHRECTOMY;  Surgeon: Lucas Mallow, MD;  [facility];  Service: Urology;  Laterality: Right;  . left index finger attachment  1980s   3-4 surgeries  . TOE SURGERY  1980s   in beween 2nd and 3rd toes cyst removed  . UPPER GI ENDOSCOPY      Social History   Socioeconomic History  . Marital status: Married    Spouse name: Not on file  . Number of children: Not on file  . Years of education: Not on file  . Highest education level: Not on file  Occupational History  . Occupation: disabled  Social Needs  . Financial resource strain: Patient refused  . Food insecurity    Worry: Never true    Inability: Never true  . Transportation needs    Medical: No    Non-medical: No  Tobacco Use  . Smoking status: Never Smoker  . Smokeless tobacco: Never Used  Substance and Sexual Activity  . Alcohol use: Never    Frequency: Never  .  Drug use: Never  . Sexual activity: Not on file  Lifestyle  . Physical activity    Days per week: 0 days    Minutes per session: 0 min  . Stress: Not on file  Relationships  . Social connections    Talks on phone: More than three times a week    Gets together: Never    Attends religious service: More than 4 times per year    Active member of club or organization: No    Attends meetings of clubs or organizations: Never    Relationship status: Married  . Intimate partner violence    Fear of current or ex partner: Not on file    Emotionally abused: Not on file    Physically abused: Not on file    Forced sexual  activity: Not on file  Other Topics Concern  . Not on file  Social History Narrative  . Not on file     Allergies  Allergen Reactions  . Penicillins Rash and Other (See Comments)    Has patient had a PCN reaction causing immediate rash, facial/tongue/throat swelling, SOB or lightheadedness with hypotension: yes Has patient had a PCN reaction causing severe rash involving mucus membranes or skin necrosis: no Has patient had a PCN reaction that required hospitalization: no Has patient had a PCN reaction occurring within the last 10 years: no If all of the above answers are "NO", then may proceed with Cephalosporin use.      Outpatient Medications Prior to Visit  Medication Sig Dispense Refill  . acetaminophen (TYLENOL) 500 MG tablet Take 1,000 mg by mouth every 6 (six) hours as needed for mild pain or headache.     . allopurinol (ZYLOPRIM) 100 MG tablet Take 1 tablet (100 mg total) by mouth every evening. 30 tablet 0  . amiodarone (PACERONE) 200 MG tablet Take 100 mg by mouth daily.    Marland Kitchen aspirin EC 81 MG EC tablet Take 1 tablet (81 mg total) by mouth daily. (Patient taking differently: Take 81 mg by mouth every evening. ) 30 tablet 0  . atorvastatin (LIPITOR) 40 MG tablet Take 1 tablet (40 mg total) by mouth daily. (Patient taking differently: Take 40 mg by mouth every  evening. ) 30 tablet 0  . blood glucose meter kit and supplies Dispense based on patient and insurance preference. Use up to four times daily as directed. (FOR ICD-10 E10.9, E11.9). 1 each 0  . diphenhydramine-acetaminophen (TYLENOL PM) 25-500 MG TABS tablet Take 2 tablets by mouth at bedtime as needed (sleep).    . fluticasone (FLONASE) 50 MCG/ACT nasal spray Place 1-2 sprays into both nostrils daily as needed for allergies or rhinitis.     Marland Kitchen HYDROcodone-acetaminophen (NORCO) 5-325 MG tablet Take 1 tablet by mouth every 4 (four) hours as needed for moderate pain. 6 tablet 0  . LANTUS SOLOSTAR 100 UNIT/ML Solostar Pen Inject 18 Units into the skin at bedtime.     Marland Kitchen loratadine (CLARITIN) 10 MG tablet Take 10 mg by mouth every evening.     . metoprolol tartrate (LOPRESSOR) 25 MG tablet Take 25 mg by mouth daily as needed (for elevated blood pressure (greater than 130)).    Marland Kitchen nitroGLYCERIN (NITROSTAT) 0.4 MG SL tablet Place 1 tablet (0.4 mg total) under the tongue every 5 (five) minutes as needed for chest pain. 14 tablet 12  . omeprazole (PRILOSEC OTC) 20 MG tablet Take 40 mg by mouth daily before breakfast.     . tamsulosin (FLOMAX) 0.4 MG CAPS capsule Take 0.4 mg by mouth every evening.   3  . warfarin (COUMADIN) 3 MG tablet Take 1 tablet (3 mg total) by mouth every evening. Take as directed by [facility] (Patient taking differently: Take 3-4.5 mg by mouth See admin instructions. Take 1 tablet (3 mg) by mouth on Tuesdays & Thursdays. Take 1.5 tablets (4.5 mg) by mouth on Mondays, Wednesdays, Fridays, Saturdays & Sundays.) 90 tablet 0   Facility-Administered Medications Prior to Visit  Medication Dose Route Frequency Provider Last Rate Last Dose  . fludeoxyglucose F - 18 (FDG) injection 61.95 millicurie  09.32 millicurie Intravenous Once PRN Lorin Picket, MD        Review of Systems  Constitutional: Negative for chills, fever, malaise/fatigue and weight loss.  HENT: Negative for hearing  loss,  sore throat and tinnitus.   Eyes: Negative for blurred vision and double vision.  Respiratory: Positive for shortness of breath. Negative for cough, hemoptysis, sputum production, wheezing and stridor.   Cardiovascular: Negative for chest pain, palpitations, orthopnea, leg swelling and PND.  Gastrointestinal: Negative for abdominal pain, constipation, diarrhea, heartburn, nausea and vomiting.  Genitourinary: Negative for dysuria, hematuria and urgency.  Musculoskeletal: Negative for joint pain and myalgias.  Skin: Negative for itching and rash.  Neurological: Negative for dizziness, tingling, weakness and headaches.  Endo/Heme/Allergies: Negative for environmental allergies. Does not bruise/bleed easily.  Psychiatric/Behavioral: Negative for depression. The patient is not nervous/anxious and does not have insomnia.   All other systems reviewed and are negative.    Objective:  Physical Exam Vitals signs reviewed.  Constitutional:      General: He is not in acute distress.    Appearance: He is well-developed.  HENT:     Head: Normocephalic and atraumatic.  Eyes:     General: No scleral icterus.    Conjunctiva/sclera: Conjunctivae normal.     Pupils: Pupils are equal, round, and reactive to light.  Neck:     Musculoskeletal: Neck supple.     Vascular: No JVD.     Trachea: No tracheal deviation.  Cardiovascular:     Rate and Rhythm: Normal rate and regular rhythm.     Heart sounds: Normal heart sounds. No murmur.  Pulmonary:     Effort: Pulmonary effort is normal. No tachypnea, accessory muscle usage or respiratory distress.     Breath sounds: No stridor. No wheezing, rhonchi or rales.  Abdominal:     General: Bowel sounds are normal. There is no distension.     Palpations: Abdomen is soft.     Tenderness: There is no abdominal tenderness.  Musculoskeletal:        General: No tenderness.  Lymphadenopathy:     Cervical: No cervical adenopathy.  Skin:    General: Skin is  warm and dry.     Capillary Refill: Capillary refill takes less than 2 seconds.     Findings: No rash.  Neurological:     Mental Status: He is alert and oriented to person, place, and time.  Psychiatric:        Behavior: Behavior normal.      Vitals:   08/07/19 1350  BP: 110/84  Pulse: 88  Temp: 97.8 F (36.6 C)  TempSrc: Temporal  SpO2: 97%  Weight: 277 lb 12.8 oz (126 kg)  Height: '6\' 1"'  (1.854 m)   97% on RA BMI Readings from Last 3 Encounters:  08/07/19 36.65 kg/m  08/05/19 36.94 kg/m  07/15/19 35.36 kg/m   Wt Readings from Last 3 Encounters:  08/07/19 277 lb 12.8 oz (126 kg)  08/05/19 280 lb (127 kg)  07/15/19 268 lb (121.6 kg)     CBC    Component Value Date/Time   WBC 7.6 04/23/2019 1330   RBC 4.58 04/23/2019 1330   HGB 13.6 07/15/2019 1022   HCT 40.0 07/15/2019 1022   PLT 209 04/23/2019 1330   MCV 82.1 04/23/2019 1330   MCH 27.3 04/23/2019 1330   MCHC 33.2 04/23/2019 1330   RDW 15.3 04/23/2019 1330   LYMPHSABS 2.2 12/19/2018 0048   MONOABS 1.0 12/19/2018 0048   EOSABS 0.4 12/19/2018 0048   BASOSABS 0.1 12/19/2018 0048     Chest Imaging: CT chest imaging from January 2020, May 2020, July 20 18 June 2019. All of the images reviewed today in succession with the  patient. Imaging originally showed small lung nodule in the lingula in January which showed enlargement progression it may up to 9 mm x 8 mm. He has documented stability from that point until October, last week which revealed a stable 9 mm x 8 mm lung nodule. Also has a another small nodule within the azygous recess of the right lower lobe that is 4 x 5 mm. The patient's images have been independently reviewed by me.    Nuclear medicine pet imaging 10/31/5972: No hypermetabolic activity within the lingular lung nodule.  However there is intense activity within the head of the pancreas associated with a small ovoid intermediate sized lesion concerning for a primary pancreatic  malignancy. The patient's images have been independently reviewed by me.    Pulmonary Functions Testing Results: No flowsheet data found.  FeNO: None   Pathology: None   Echocardiogram: None   Heart Catheterization: None     Assessment & Plan:     ICD-10-CM   1. Nodule of upper lobe of left lung  R91.1   2. Abnormal findings on diagnostic imaging of lung  R91.8   3. History of renal cell cancer  Z85.528   4. ESRD on hemodialysis (HCC)  N18.6    Z99.2   5. Abnormal PET scan, retroperitoneal  R94.8     Discussion:  63 year old gentleman followed here for a left upper lobe lung nodule with a history of renal cell carcinoma status post nephrectomy in 2019.  Nuclear medicine pet imaging with no uptake within this lingular lung nodule.  I believe we can continue to follow this on imaging to see if there is any change or growth however the new finding of a pancreatic head lesion is concerning.  Plan: We will refer patient to gastroenterology for endoscopic ultrasound and biopsy of pancreatic lesion.  As for the patient's lung nodule will have a repeat noncontrasted CT of the chest in 6 months.  Greater than 50% of this patient's 15-minute office visit was in face-to-face discussing the recommendation treatment plan.      Current Outpatient Medications:  .  acetaminophen (TYLENOL) 500 MG tablet, Take 1,000 mg by mouth every 6 (six) hours as needed for mild pain or headache. , Disp: , Rfl:  .  allopurinol (ZYLOPRIM) 100 MG tablet, Take 1 tablet (100 mg total) by mouth every evening., Disp: 30 tablet, Rfl: 0 .  amiodarone (PACERONE) 200 MG tablet, Take 100 mg by mouth daily., Disp: , Rfl:  .  aspirin EC 81 MG EC tablet, Take 1 tablet (81 mg total) by mouth daily. (Patient taking differently: Take 81 mg by mouth every evening. ), Disp: 30 tablet, Rfl: 0 .  atorvastatin (LIPITOR) 40 MG tablet, Take 1 tablet (40 mg total) by mouth daily. (Patient taking differently: Take 40 mg by mouth  every evening. ), Disp: 30 tablet, Rfl: 0 .  blood glucose meter kit and supplies, Dispense based on patient and insurance preference. Use up to four times daily as directed. (FOR ICD-10 E10.9, E11.9)., Disp: 1 each, Rfl: 0 .  diphenhydramine-acetaminophen (TYLENOL PM) 25-500 MG TABS tablet, Take 2 tablets by mouth at bedtime as needed (sleep)., Disp: , Rfl:  .  fluticasone (FLONASE) 50 MCG/ACT nasal spray, Place 1-2 sprays into both nostrils daily as needed for allergies or rhinitis. , Disp: , Rfl:  .  HYDROcodone-acetaminophen (NORCO) 5-325 MG tablet, Take 1 tablet by mouth every 4 (four) hours as needed for moderate pain., Disp: 6 tablet, Rfl: 0 .  LANTUS SOLOSTAR 100 UNIT/ML Solostar Pen, Inject 18 Units into the skin at bedtime. , Disp: , Rfl:  .  loratadine (CLARITIN) 10 MG tablet, Take 10 mg by mouth every evening. , Disp: , Rfl:  .  metoprolol tartrate (LOPRESSOR) 25 MG tablet, Take 25 mg by mouth daily as needed (for elevated blood pressure (greater than 130))., Disp: , Rfl:  .  nitroGLYCERIN (NITROSTAT) 0.4 MG SL tablet, Place 1 tablet (0.4 mg total) under the tongue every 5 (five) minutes as needed for chest pain., Disp: 14 tablet, Rfl: 12 .  omeprazole (PRILOSEC OTC) 20 MG tablet, Take 40 mg by mouth daily before breakfast. , Disp: , Rfl:  .  tamsulosin (FLOMAX) 0.4 MG CAPS capsule, Take 0.4 mg by mouth every evening. , Disp: , Rfl: 3 .  warfarin (COUMADIN) 3 MG tablet, Take 1 tablet (3 mg total) by mouth every evening. Take as directed by [facility] (Patient taking differently: Take 3-4.5 mg by mouth See admin instructions. Take 1 tablet (3 mg) by mouth on Tuesdays & Thursdays. Take 1.5 tablets (4.5 mg) by mouth on Mondays, Wednesdays, Fridays, Saturdays & Sundays.), Disp: 90 tablet, Rfl: 0 No current facility-administered medications for this visit.   Facility-Administered Medications Ordered in Other Visits:  .  fludeoxyglucose F - 18 (FDG) injection 35.68 millicurie, 61.68  millicurie, Intravenous, Once PRN, Lorin Picket, MD   Garner Nash, DO [facility] 08/07/2019 2:14 PM

## 2019-08-08 ENCOUNTER — Other Ambulatory Visit (INDEPENDENT_AMBULATORY_CARE_PROVIDER_SITE_OTHER): Payer: PRIVATE HEALTH INSURANCE

## 2019-08-08 DIAGNOSIS — Z7901 Long term (current) use of anticoagulants: Secondary | ICD-10-CM | POA: Diagnosis not present

## 2019-08-08 DIAGNOSIS — R911 Solitary pulmonary nodule: Secondary | ICD-10-CM | POA: Insufficient documentation

## 2019-08-08 DIAGNOSIS — I4891 Unspecified atrial fibrillation: Secondary | ICD-10-CM | POA: Diagnosis not present

## 2019-08-08 DIAGNOSIS — I471 Supraventricular tachycardia: Secondary | ICD-10-CM

## 2019-08-08 DIAGNOSIS — R55 Syncope and collapse: Secondary | ICD-10-CM

## 2019-08-08 DIAGNOSIS — I10 Essential (primary) hypertension: Secondary | ICD-10-CM

## 2019-08-08 DIAGNOSIS — I472 Ventricular tachycardia: Secondary | ICD-10-CM

## 2019-08-08 DIAGNOSIS — I4729 Other ventricular tachycardia: Secondary | ICD-10-CM

## 2019-08-08 NOTE — Progress Notes (Signed)
POST OPERATIVE OFFICE NOTE    CC:  F/u for surgery  HPI:  This is a 63 y.o. male who is s/p left 2nd stage BVT on 07/15/2019 by Dr. Donzetta Matters.  He has a tunneled dialysis catheter that was placed by [facility] that is working well.   The fistula was thick throughout ~ 1cm.  At completion there was a strong thrill and a palpable radial pulse at the wrist. It was noted that the fistula could be cannulated 4 weeks from 07/15/2019.  He presents today for follow up.  He states he is doing very well.  He has occasional numbness in his left hand if he rests his elbow down on a solid surface for too long.  He denies any pain or numbness otherwise.    Allergies  Allergen Reactions  . Penicillins Rash and Other (See Comments)    Has patient had a PCN reaction causing immediate rash, facial/tongue/throat swelling, SOB or lightheadedness with hypotension: yes Has patient had a PCN reaction causing severe rash involving mucus membranes or skin necrosis: no Has patient had a PCN reaction that required hospitalization: no Has patient had a PCN reaction occurring within the last 10 years: no If all of the above answers are "NO", then may proceed with Cephalosporin use.     Current Outpatient Medications  Medication Sig Dispense Refill  . acetaminophen (TYLENOL) 500 MG tablet Take 1,000 mg by mouth every 6 (six) hours as needed for mild pain or headache.     . allopurinol (ZYLOPRIM) 100 MG tablet Take 1 tablet (100 mg total) by mouth every evening. 30 tablet 0  . amiodarone (PACERONE) 200 MG tablet Take 100 mg by mouth daily.    Marland Kitchen aspirin EC 81 MG EC tablet Take 1 tablet (81 mg total) by mouth daily. (Patient taking differently: Take 81 mg by mouth every evening. ) 30 tablet 0  . atorvastatin (LIPITOR) 40 MG tablet Take 1 tablet (40 mg total) by mouth daily. (Patient taking differently: Take 40 mg by mouth every evening. ) 30 tablet 0  . blood glucose meter kit and supplies Dispense based on patient and  insurance preference. Use up to four times daily as directed. (FOR ICD-10 E10.9, E11.9). 1 each 0  . diphenhydramine-acetaminophen (TYLENOL PM) 25-500 MG TABS tablet Take 2 tablets by mouth at bedtime as needed (sleep).    . fluticasone (FLONASE) 50 MCG/ACT nasal spray Place 1-2 sprays into both nostrils daily as needed for allergies or rhinitis.     Marland Kitchen HYDROcodone-acetaminophen (NORCO) 5-325 MG tablet Take 1 tablet by mouth every 4 (four) hours as needed for moderate pain. 6 tablet 0  . LANTUS SOLOSTAR 100 UNIT/ML Solostar Pen Inject 18 Units into the skin at bedtime.     Marland Kitchen loratadine (CLARITIN) 10 MG tablet Take 10 mg by mouth every evening.     . metoprolol tartrate (LOPRESSOR) 25 MG tablet Take 25 mg by mouth daily as needed (for elevated blood pressure (greater than 130)).    Marland Kitchen nitroGLYCERIN (NITROSTAT) 0.4 MG SL tablet Place 1 tablet (0.4 mg total) under the tongue every 5 (five) minutes as needed for chest pain. 14 tablet 12  . omeprazole (PRILOSEC OTC) 20 MG tablet Take 40 mg by mouth daily before breakfast.     . tamsulosin (FLOMAX) 0.4 MG CAPS capsule Take 0.4 mg by mouth every evening.   3  . warfarin (COUMADIN) 3 MG tablet Take 1 tablet (3 mg total) by mouth every evening. Take  as directed by [facility] (Patient taking differently: Take 3-4.5 mg by mouth See admin instructions. Take 1 tablet (3 mg) by mouth on Tuesdays & Thursdays. Take 1.5 tablets (4.5 mg) by mouth on Mondays, Wednesdays, Fridays, Saturdays & Sundays.) 90 tablet 0   No current facility-administered medications for this visit.   Facility-Administered Medications Ordered in Other Visits  Medication Dose Route Frequency Provider Last Rate Last Admin  . fludeoxyglucose F - 18 (FDG) injection 42.32 millicurie  00.94 millicurie Intravenous Once PRN Lorin Picket, MD         ROS:  See HPI  Physical Exam:  Today's Vitals   08/09/19 0932  BP: 115/67  Pulse: 94  Resp: 18  Temp: 97.6 F (36.4 C)  TempSrc:  Temporal  SpO2: 99%  Weight: 274 lb (124.3 kg)  Height: '6\' 1"'  (1.854 m)   Body mass index is 36.15 kg/m.   Incision:  Healed nicely Extremities:  Unable to palpate left radial/ulnar pulse. Motor and sensation are in tact and hand grip is strong.  There is an excellent thrill in the fistula and is easily palpable.    Assessment/Plan:  This is a 63 y.o. male who is s/p: Left arm second stage basilic vein transposition fistula creation on 07/15/2019 by Dr. Donzetta Matters  -pt doing well and does not have evidence of steal.  He does have occasional numbness in his left hand when resting his elbow on a solid surface but this improves.  I did discuss steal sx with him and should he develop this, he will contact us asap.   -the fistula can be accessed on 08/14/2019.  Once it is used several times to the satisfaction of the dialysis center, he can be scheduled to get his catheter removed, which was placed by [facility].  -he will f/u as needed.    Leontine Locket, PA-C [facility] [phone]  Clinic MD:  Donzetta Matters

## 2019-08-08 NOTE — Progress Notes (Signed)
ekg 

## 2019-08-09 ENCOUNTER — Other Ambulatory Visit: Payer: Self-pay

## 2019-08-09 ENCOUNTER — Ambulatory Visit (INDEPENDENT_AMBULATORY_CARE_PROVIDER_SITE_OTHER): Payer: PRIVATE HEALTH INSURANCE | Admitting: Physician Assistant

## 2019-08-09 VITALS — BP 115/67 | HR 94 | Temp 97.6°F | Resp 18 | Ht 73.0 in | Wt 274.0 lb

## 2019-08-09 DIAGNOSIS — Z992 Dependence on renal dialysis: Secondary | ICD-10-CM

## 2019-08-09 DIAGNOSIS — N186 End stage renal disease: Secondary | ICD-10-CM

## 2019-08-14 ENCOUNTER — Telehealth: Payer: Self-pay | Admitting: Pulmonary Disease

## 2019-08-14 NOTE — Telephone Encounter (Signed)
PCCM:  I would have patient call and discuss with Dr. Ardis Hughs office. He was going to reach out to surgeon as well. This area has been biopsied before.   [facility] 08/14/2019 1:43 PM

## 2019-08-14 NOTE — Telephone Encounter (Signed)
Spoke with patient's wife Vaughan Basta. She stated that she is confused. She called Dr. Ardis Hughs office. Dr. Ardis Hughs office would not give her any information and told her to call our office back. She wanted to know who the patient would be referred to since Dr. Ardis Hughs denied the referral.   I looked at the referral note, it does mention Dr. Barry Dienes and Dr. Gloriann Loan. Did not mention this to [facility]   Dr. Valeta Harms, do we need to tell her to contact their offices?

## 2019-08-14 NOTE — Telephone Encounter (Signed)
I spoke with the pt's spouse and notified of recs per Dr Valeta Harms  She will call Dr Eugenia Pancoast office  Nothing further needed

## 2019-08-14 NOTE — Telephone Encounter (Signed)
Dr. Valeta Harms, this patient was last seen by you on 08/07/19. The referral to GI was issued same day. However, the information below was noted on it:  "----- Message ----- From: Garner Nash, DO Sent: 08/14/2019  10:10 AM EST To: Hoy Register  Thanks I spoke with Dr. Angelica Chessman ----- Message ----- From: Hoy Register Sent: 08/14/2019   8:53 AM EST To: Garner Nash, DO  Good morning, Dr. Valeta Harms, please see message from Dr. Ardis Hughs below:  "I already biopsied this spot a little over a year ago.  I communicated this with the referring physician as well as surgeon Dr. Barry Dienes and urologist Dr. Gloriann Loan.  He does not need repeat EUS.""  Based on this, please advise what information we need to give to the patient and his wife so they are not worried or upset about the cancellation. Is there specific follow up needed? Please advise, thank you much.

## 2019-08-14 NOTE — Telephone Encounter (Signed)
PCCM:  I will reach out to Dr. Ardis Hughs to help clarify any questions. From previous messages he was going to speak with Dr. Barry Dienes and Dr. Gloriann Loan. I am unsure of the plans at this time.   CC: Louanne Belton, DO [facility] 08/14/2019 4:42 PM

## 2019-08-14 NOTE — Telephone Encounter (Signed)
Noted. Will keep encounter open.

## 2019-08-15 NOTE — Telephone Encounter (Signed)
Great thank you. I really appreciate it.  Lance Muss, DO [facility] 08/15/2019 9:23 AM

## 2019-08-15 NOTE — Telephone Encounter (Signed)
I spoke with his wife George Lewis on the phone this morning.  Erik Obey is the [phone] phone number and that is the best way to reach them.  We had a nice conversation and I believe I answered all of her questions and concerns.  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  Can your office reach out to them to offer an upcoming appointment? Thanks   Patty, Can you work with Dr. Marlowe Aschoff office about the above?  Thanks   Dr. Gloriann Loan, FYI  Dr. Valeta Harms, [facility]

## 2019-08-15 NOTE — Telephone Encounter (Signed)
09/02/19 3:45 pm appt with Dr Barry Dienes at the [place] office .  The pt has been advised

## 2019-08-20 ENCOUNTER — Other Ambulatory Visit (HOSPITAL_COMMUNITY): Payer: Self-pay | Admitting: *Deleted

## 2019-08-20 MED ORDER — AMIODARONE HCL 200 MG PO TABS: 200.0000 mg | ORAL_TABLET | Freq: Every day | ORAL | 3 refills | Status: DC

## 2019-08-27 ENCOUNTER — Other Ambulatory Visit: Payer: Self-pay

## 2019-08-27 ENCOUNTER — Ambulatory Visit (INDEPENDENT_AMBULATORY_CARE_PROVIDER_SITE_OTHER): Payer: PRIVATE HEALTH INSURANCE | Admitting: *Deleted

## 2019-08-27 DIAGNOSIS — Z7901 Long term (current) use of anticoagulants: Secondary | ICD-10-CM | POA: Diagnosis not present

## 2019-08-27 DIAGNOSIS — I4891 Unspecified atrial fibrillation: Secondary | ICD-10-CM | POA: Diagnosis not present

## 2019-08-27 LAB — POCT INR: INR: 2.3 (ref 2.0–3.0)

## 2019-08-27 NOTE — Patient Instructions (Signed)
11/27  Started Amiodarone 200mg  daily Continue taking 1.5 tablets daily except 1 tablet each Tuesday and Thursday.  Repeat INR in 4 weeks

## 2019-08-28 ENCOUNTER — Encounter: Payer: Self-pay | Admitting: Adult Health

## 2019-08-28 ENCOUNTER — Ambulatory Visit (INDEPENDENT_AMBULATORY_CARE_PROVIDER_SITE_OTHER): Payer: PRIVATE HEALTH INSURANCE | Admitting: Adult Health

## 2019-08-28 VITALS — BP 122/79 | HR 91 | Ht 73.0 in | Wt 280.0 lb

## 2019-08-28 DIAGNOSIS — I480 Paroxysmal atrial fibrillation: Secondary | ICD-10-CM

## 2019-08-28 DIAGNOSIS — E1169 Type 2 diabetes mellitus with other specified complication: Secondary | ICD-10-CM

## 2019-08-28 DIAGNOSIS — Z79899 Other long term (current) drug therapy: Secondary | ICD-10-CM

## 2019-08-28 DIAGNOSIS — E785 Hyperlipidemia, unspecified: Secondary | ICD-10-CM

## 2019-08-28 DIAGNOSIS — K8689 Other specified diseases of pancreas: Secondary | ICD-10-CM

## 2019-08-28 DIAGNOSIS — Z0000 Encounter for general adult medical examination without abnormal findings: Secondary | ICD-10-CM

## 2019-08-28 DIAGNOSIS — E7800 Pure hypercholesterolemia, unspecified: Secondary | ICD-10-CM | POA: Diagnosis not present

## 2019-08-28 DIAGNOSIS — E119 Type 2 diabetes mellitus without complications: Secondary | ICD-10-CM | POA: Diagnosis not present

## 2019-08-28 DIAGNOSIS — E669 Obesity, unspecified: Secondary | ICD-10-CM

## 2019-08-28 DIAGNOSIS — I10 Essential (primary) hypertension: Secondary | ICD-10-CM

## 2019-08-28 LAB — POCT GLYCOSYLATED HEMOGLOBIN (HGB A1C): Hemoglobin A1C: 5.1 % (ref 4.0–5.6)

## 2019-08-28 NOTE — Assessment & Plan Note (Signed)
Lab Results  Component Value Date   HGBA1C 5.1 08/28/2019   HGBA1C 5.1 05/28/2019   HGBA1C 7.4 (A) 02/11/2019  He reports AM BG 86-106, Prior to lunch 71-126, Prior to dinner 94-121 He is currently on Lantus 18 U QHS- which was reduced from 24 U to 18 on  05/28/2019 due to low A1c and BG levels at home- he has yet to follow-up with his Endo/Dr. Cruzita Lederer Will further reduce today- advised to f/u with Endo Message sent to Dr. Cruzita Lederer

## 2019-08-28 NOTE — Patient Instructions (Addendum)
Type 2 Diabetes Mellitus, Self Care, Adult When you have type 2 diabetes (type 2 diabetes mellitus), you must make sure your blood sugar (glucose) stays in a healthy range. You can do this with:  Nutrition.  Exercise.  Lifestyle changes.  Medicines or insulin, if needed.  Support from your doctors and others. How to stay aware of blood sugar   Check your blood sugar level every day, as often as told.  Have your A1c (hemoglobin A1c) level checked two or more times a year. Have it checked more often if your doctor tells you to. Your doctor will set personal treatment goals for you. Generally, you should have these blood sugar levels:  Before meals (preprandial): 80-130 mg/dL (4.4-7.2 mmol/L).  After meals (postprandial): below 180 mg/dL (10 mmol/L).  A1c level: less than 7%. How to manage high and low blood sugar Signs of high blood sugar High blood sugar is called hyperglycemia. Know the signs of high blood sugar. Signs may include:  Feeling: ? Thirsty. ? Hungry. ? Very tired.  Needing to pee (urinate) more than usual.  Blurry vision. Signs of low blood sugar Low blood sugar is called hypoglycemia. This is when blood sugar is at or below 70 mg/dL (3.9 mmol/L). Signs may include:  Feeling: ? Hungry. ? Worried or nervous (anxious). ? Sweaty and clammy. ? Confused. ? Dizzy. ? Sleepy. ? Sick to your stomach (nauseous).  Having: ? A fast heartbeat. ? A headache. ? A change in your vision. ? Jerky movements that you cannot control (seizure). ? Tingling or no feeling (numbness) around your mouth, lips, or tongue.  Having trouble with: ? Moving (coordination). ? Sleeping. ? Passing out (fainting). ? Getting upset easily (irritability). Treating low blood sugar To treat low blood sugar, eat or drink something sugary right away. If you can think clearly and swallow safely, follow the 15:15 rule:  Take 15 grams of a fast-acting carb (carbohydrate). Talk with your  doctor about how much you should take.  Some fast-acting carbs are: ? Sugar tablets (glucose pills). Take 3-4 pills. ? 6-8 pieces of hard candy. ? 4-6 oz (120-150 mL) of fruit juice. ? 4-6 oz (120-150 mL) of regular (not diet) soda. ? 1 Tbsp (15 mL) honey or sugar.  Check your blood sugar 15 minutes after you take the carb.  If your blood sugar is still at or below 70 mg/dL (3.9 mmol/L), take 15 grams of a carb again.  If your blood sugar does not go above 70 mg/dL (3.9 mmol/L) after 3 tries, get help right away.  After your blood sugar goes back to normal, eat a meal or a snack within 1 hour. Treating very low blood sugar If your blood sugar is at or below 54 mg/dL (3 mmol/L), you have very low blood sugar (severe hypoglycemia). This is an emergency. Do not wait to see if the symptoms will go away. Get medical help right away. Call your local emergency services (911 in the [place] If you have very low blood sugar and you cannot eat or drink, you may need a glucagon shot (injection). A family member or friend should learn how to check your blood sugar and how to give you a glucagon shot. Ask your doctor if you need to have a glucagon shot kit at home. Follow these instructions at home: Medicine  Take insulin and diabetes medicines as told.  If your doctor says you should take more or less insulin and medicines, do this exactly as told.  Do not run out of insulin or medicines. Having diabetes can raise your risk for other long-term conditions. These include heart disease and kidney disease. Your doctor may prescribe medicines to help you not have these problems. Food   Make healthy food choices. These include: ? Chicken, fish, egg whites, and beans. ? Oats, whole wheat, bulgur, brown rice, quinoa, and millet. ? Fresh fruits and vegetables. ? Low-fat dairy products. ? Nuts, avocado, olive oil, and canola oil.  Meet with a food specialist (dietitian). He or she can help you make an  eating plan that is right for you.  Follow instructions from your doctor about what you cannot eat or drink.  Drink enough fluid to keep your pee (urine) pale yellow.  Keep track of carbs that you eat. Do this by reading food labels and learning food serving sizes.  Follow your sick day plan when you cannot eat or drink normally. Make this plan with your doctor so it is ready to use. Activity  Exercise 3 or more times a week.  Do not go more than 2 days without exercising.  Talk with your doctor before you start a new exercise. Your doctor may need to tell you to change: ? How much insulin or medicines you take. ? How much food you eat. Lifestyle  Do not use any tobacco products. These include cigarettes, chewing tobacco, and e-cigarettes. If you need help quitting, ask your doctor.  Ask your doctor how much alcohol is safe for you.  Learn to deal with stress. If you need help with this, ask your doctor. Body care   Stay up to date with your shots (immunizations).  Have your eyes and feet checked by a doctor as often as told.  Check your skin and feet every day. Check for cuts, bruises, redness, blisters, or sores.  Brush your teeth and gums two times a day. Floss one or more times a day.  Go to the dentist one or more times every 6 months.  Stay at a healthy weight. General instructions  Take over-the-counter and prescription medicines only as told by your doctor.  Share your diabetes care plan with: ? Your work or school. ? People you live with.  Carry a card or wear jewelry that says you have diabetes.  Keep all follow-up visits as told by your doctor. This is important. Questions to ask your doctor  Do I need to meet with a diabetes educator?  Where can I find a support group for people with diabetes? Where to find more information To learn more about diabetes, visit:  American Diabetes Association: www.diabetes.org  American Association of Diabetes  Educators: www.diabeteseducator.org Summary  When you have type 2 diabetes, you must make sure your blood sugar (glucose) stays in a healthy range.  Check your blood sugar every day, as often as told.  Having diabetes can raise your risk for other conditions. Your doctor may prescribe medicines to help you not have these problems.  Keep all follow-up visits as told by your doctor. This is important. This information is not intended to replace advice given to you by your health care provider. Make sure you discuss any questions you have with your health care provider. Document Released: 12/07/2015 Document Revised: 02/05/2018 Document Reviewed: 09/18/2015 Elsevier Patient Education  [place].   Please reduce Lantus from 18 U to 10 U once nightly. Please follow-up with Endocrinology/Dr. Cruzita Lederer ASAP. If your fasting blood sugar is ever <90- call Dr. Cruzita Lederer. Continue  all medications as directed. Please keep appt with Dr. Barry Dienes 4 Jan. Please keep follow-up with [facility]mad[facility] Clinic and Atrial Fibrillation clinic. Continue to social distance and wear a mask when in public. We will call you with lab results tomorrow- please check to see if you are taking Atorvastatin to treat high cholesterol. Follow-up here in 3 months. GREAT TO SEE YOU!

## 2019-08-28 NOTE — Assessment & Plan Note (Signed)
PET 08/05/2019- IMPRESSION: 1. No metabolic activity associated with the lingular nodule. 2. Intense activity in the head of the pancreas associates with the small ovoid intermediate lesion. 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 Recommend endoscopic ultrasound for evaluation and potential sampling 3. Intense metabolic activity associated with a fatty peritoneal lesion beneath the umbilicus. Differential would include postsurgical fatty inflammation versus inflamed epiploic appendage. Metastatic disease is felt unlikely. 4. 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 Pulmonology notes- OV 08/07/2019: Patient here for follow-up after PET scan imaging.  Lung nodule on PET scan was non-PET avid however there is a lesion found within the pancreas.  Discussed this today in the office.    Pt was referred to gastroenterology for endoscopic ultrasound and biopsy of pancreatic lesion- has appt with Dr. Barry Dienes 4 Jan

## 2019-08-28 NOTE — Assessment & Plan Note (Signed)
He experienced syncopal event during HD 07/25/2019- He was restarted on Amiordarone 200mg  QD due to concerns about A Fib On Coumadin

## 2019-08-28 NOTE — Progress Notes (Signed)
Subjective:    Patient ID: [name]., male    DOB: 1956-07-20, 63 y.o.   MRN: [id number]  HPI:01/28/2019 OV: Mr. Barbeau is here to establish as a new pt. He is a pleasant 63 year old male. PMH: HTN, Obesity, OSA, Renal Cell Ca, S/P Nephrectomy 2019,CKD IV with fistula (left radiocephalic AV fistula on 02/26/2457) , and new onset diabetes A1c >14 at last hospital admission April 2020. He has been using various urgent cares for primary care needs and frequent ED visits with subsequent hospital admissions. He is followed by Dr. Upton/Nephrology-advised to follow up with 2-3 weeks and have the following labs: CBC, BMP, Intact PTH Echocardiogramrecommendedfor nonsustained asymptomatic VT noted incidentally on Tele in the hospital He was started on Lantus and Humolog Sliding Scale, provided detailed instructions on how to manage diabetes  Pancreatic neck mass The patient had a1.1cm indeterminate pancreatic mass noted incidentally on MRI abdomen from 02/2018 in setting of RCC.  At that time was recommendedto have a3-6 monthintervalrepeat noncon MRI- never completed CT WO completed during April 2020 hospitalization  IMPRESSION: Hepatic steatosis. Left nephrolithiasis. Mild left hydronephrosis is noted without evidence of obstructing calculus. Status post right nephrectomy. Diverticulosis of descending and sigmoid colon without inflammation.  Recommendation to repeat MRI abdomen WO in 3-6 months  Recent Labs       Lab Results  Component Value Date   HGBA1C 14.9 (H) 12/19/2018     Mr. Belay has been seen by his Nephrologist/Dr. Hollie Salk, labs completed. He was seen by his previous PCP, labs completed, but due to lapse in time since last OV- he was told he would have to re-establish as a new pt. He reports medication compliance, denies SE He has not used Metoprolol tartrate 101m BID in months due to SBP 110, his Nephrologist instructed him to hold BB if SBP  <130 He has been using Lantus and Humalog per Dr. DSabino Niemannclear instructions- BG has been steadily trending down, with BG in 80s the last two weeks- will decrease injectable therapy. He reports following Diabetic diet, walking frequently, and even doing yard work- as tolerated. He reports vision change, specifically blurred vision. He states "it has been years since I have had my eyes looked at". Recommend diabetic eye examination   02/25/2019 OV: Ms. RVeais here f/u:T2D, Obesity, HTN, OSA,Renal Cell Ca, S/P Nephrectomy 2019  He established with Endocrinologist 02/11/2019: anti-diabetic medications adjusted: Please continue: Metformin 1000 mg 2x a day, with meals- HE IS NOT ON METFORMIN Please decrease: Lantus 32 units once a day at bedtime Novolog 6-8 units 3x a day, before meal Stop granola Please let me know if the sugars are consistently <80 or >200.  02/25/2019 OV: Mr. RRonereports medication compliance. Per his BG log- he is having readings- 70-120s Advised him to call his Endocrinologist ASAP. He continues to slowly loss wt, current wt 301, 5 lbs wt loss since initial OV 01/28/2019 He has been following a diabetic diet, reports poor appetite. Discussed the importance of eating after taking the Humalog injection, he cannot skip meals after humalog injection. He reports fatigue that has been steadily worsening since Fall 2020. Echocardiogram ordered 01/28/2019- yet to be completed. He reports being able to climb the 14 stairs in his home with and without difficulty, "depending on the day". He denies CP/palpitations. He reports having several treadmill tests >15 years ago. He reports urinating several times/day He reports difficulty with accepting that he cannot work- he is awaiting disability to  be awarded. He will f/u with Nephrologist 15 March 2019. He denies any complications with L AV fistula  05/28/2019 OV: Mr. Poplaski is here for 3 month f/u-HTN, Obesity,  OSA, Renal Cell Ca, S/P Nephrectomy 2019,CKD IV with fistula  He has had multiple hospitalizations for GI upset (diarrhea/food poisoning), Precordial Pain, DX'd WITH ATRIAL FIB 04/22/2019, started on anti-coag, now established with Cardiology, Atrial Fib Clinic, and Coumadin Clinic. He has not followed up with primary care as directed after various hospitalizations- today is first contact Echo 02/26/19 showed normal EF 60-65% with mild LAE 05/22/2019 A Fib Clinic- He is to stop Amiodarone 05/23/2019  Last contact with Endo/Dr. Cruzita Lederer- via telephone 03/08/2019, last OV 02/11/2019- was instructed to f/u in 3 months He provided BG log- AM BG 88-96, Prior to lunch 73-102, Prior to dinner 73-125 He is currently taking Lantus 24 U QS- advised to reduce to 18 U and follow up with Endo immediately   Recent Labs       Lab Results  Component Value Date   HGBA1C 5.1 05/28/2019   HGBA1C 7.4 (A) 02/11/2019   HGBA1C 14.9 (H) 12/19/2018     He has not used Metoprolol >4 months- not on any anti-hypertensives He has not used any Nitro He denies any cardiac sx's  He has HD Tuesday, Thursday, Sat- on Kidney transplant list  He was awarded disability finally   He states, "I am still adjusting to all this medical stuff"  08/28/2019 OV: Mr. Kouba is here for regular f/u: HTN, T2D, ESRD, HLD,  A Fib He denies current acute cardiac sx's  He experienced syncopal event during HD 07/25/2019- He was restarted on Amiordarone 229m QD due to concerns about A Fib  He reports AM BG 86-106, Prior to lunch 71-126, Prior to dinner 94-121 He is currently on Lantus 18 U QHS- which was reduced from 24 U to 18 on  05/28/2019 due to low A1c and BG levels at home- he has yet to follow-up with his Endo/Dr. GCruzita LedererWill further reduce today- advised to f/u with Endo  PET 08/05/2019- IMPRESSION: 1. No metabolic activity associated with the lingular nodule. 2. Intense activity in the head of the pancreas  associates with the small ovoid intermediate lesion. 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 Recommend endoscopic ultrasound for evaluation and potential sampling 3. Intense metabolic activity associated with a fatty peritoneal lesion beneath the umbilicus. Differential would include postsurgical fatty inflammation versus inflamed epiploic appendage. Metastatic disease is felt unlikely. 4. 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 Pulmonology notes- OV 08/07/2019: Patient here for follow-up after PET scan imaging.  Lung nodule on PET scan was non-PET avid however there is a lesion found within the pancreas.  Discussed this today in the office.  Patient has no complaints from respiratory standpoint today.  He is recently underwent a new fistula revision with Dr. CKasandra Knudsen  Going back to see him next week.  Still has his tunneled catheter.  Going to dialysis Tuesday Thursday Saturday.  Pt was referred to gastroenterology for endoscopic ultrasound and biopsy of pancreatic lesion.  F/u on lung nodule will have a repeat noncontrasted CT of the chest in 6 months with Pulmonology   LUE  - Left arm second stage basilic vein transposition fistula creation on 07/15/2019 by Dr. CDonzetta MattersFistula accessed since 08/14/2019  Hemodialysis on  Tues/Thurs/Sat Patient Care Team    Relationship Specialty Notifications Start End  Casey Maxfield, KBerna Spare NP PCP - General Family Medicine  01/16/19   Elouise Munroe, MD PCP - Cardiology Cardiology  03/06/19     Patient Active Problem List   Diagnosis Date Noted  . Atrial fibrillation ([place]) 04/26/2019  . Long term (current) use of anticoagulants 04/26/2019  . Diarrhea 04/20/2019  . Near syncope 04/20/2019  . NSVT (nonsustained ventricular tachycardia) ([place]) 04/20/2019  . ESRD on hemodialysis ([place]) 04/20/2019  . Chest pain 03/06/2019  . Diabetes mellitus type 2 in obese ([place]) 03/06/2019  . Obesity (BMI 30-39.9) 03/06/2019  .  Healthcare maintenance 01/28/2019  . DKA (diabetic ketoacidoses) ([place]) 12/19/2018  . Newly diagnosed diabetes ([place]) 12/19/2018  . Syncope 06/12/2018  . Ileus ([place])   . SVT (supraventricular tachycardia) ([place])   . Renal mass, right 05/16/2018  . Mass of pancreas   . Abnormal CT of the abdomen   . Bacteremia due to Enterococcus 04/23/2018  . Sepsis ([place]) 04/19/2018  . Acute renal failure superimposed on stage 4 chronic kidney disease ([place]) 04/19/2018  . Dehydration 04/19/2018  . UTI (urinary tract infection): Probable 04/19/2018  . Leukocytosis 04/19/2018  . Anemia 04/19/2018  . Renal cell carcinoma, right ([place]) 04/19/2018  . S/P ureteral stent placement: 04/16/2018 04/19/2018  . GERD (gastroesophageal reflux disease) 04/19/2018  . OSA on CPAP 04/19/2018  . HTN (hypertension) 04/19/2018     Past Medical History:  Diagnosis Date  . Anemia   . Arthritis    knees  . Atrial fibrillation ([place])   . Bilateral renal cysts 03/23/2018   Noted MRI ABD  . Cancer [facility])    right kidney cancer  . Cancer [facility])    pancreatic  . Cholelithiasis 03/19/2018   noted on CT AB/Pelvis  . CKD (chronic kidney disease), stage IV [facility])    nephrologist-- dr Hollie Salk [name]);  05-01-2018 has not started dialysis, scheduled for AV fistula creation 05-07-2018  . Diabetes mellitus without complication ([place])   . Diverticulosis of colon 04/19/2018   Noted on CT abd/pelvis  . GERD (gastroesophageal reflux disease)   . Hepatic steatosis 03/19/2018   Mild diffuse, noted on CT AB/Pelvis  . History of kidney stones   . History of pulmonary embolus (PE) 03/2008   treated with coumadin for 6 months  . History of sepsis 04/20/2018   per discharge note , probable UTI  . Hypertension   . IBS (irritable bowel syndrome)   . Nocturia   . OSA on CPAP    uses CPAP nightly  . Pancreatic lesion    0.4cm cystic per CT 07/ 2019  . Peripheral vascular disease ([place])   . Pneumonia    x 1  . Pulmonary nodule     Solid 4m Right Lower Lobe  . Right renal mass 04/10/2018   new dx--  scheduled for nephrectomy 05-16-2018  . S/P ureteral stent placement: 04/16/2018 04/19/2018     Past Surgical History:  Procedure Laterality Date  . AV FISTULA PLACEMENT Left 05/07/2018   Procedure: Creation of Left arm Radiocephalic Fistula;  Surgeon: CMarty Heck MD;  [place]  Service: Vascular;  Laterality: Left;  . AV FISTULA PLACEMENT Left 05/15/2019   Procedure: Creation of Brachiocephalic fistula left arm;  Surgeon: CWaynetta Sandy MD;  [place]  Service: Vascular;  Laterality: Left;  . BASCILIC VEIN TRANSPOSITION Left 07/15/2019   Procedure: BASILIC VEIN TRANSPOSITION SECOND STAGE;  Surgeon: CWaynetta Sandy MD;  [place]  Service: Vascular;  Laterality: Left;  . COLONOSCOPY    . CYSTOSCOPY/RETROGRADE/URETEROSCOPY/STONE EXTRACTION WITH  BASKET  x2 last one 1990s approx.  . CYSTOSCOPY/URETEROSCOPY/HOLMIUM LASER/STENT PLACEMENT Left 04/16/2018   Procedure: CYSTOSCOPY/LEFT URETEROSCOPY/LEFT RETROGRADE/STENT PLACEMENT;  Surgeon: Lucas Mallow, MD;  [facility];  Service: Urology;  Laterality: Left;  . EUS N/A 05/03/2018   Procedure: UPPER ENDOSCOPIC ULTRASOUND (EUS) RADIAL;  Surgeon: Milus Banister, MD;  [facility];  Service: Gastroenterology;  Laterality: N/A;  . EUS N/A 05/03/2018   Procedure: UPPER ENDOSCOPIC ULTRASOUND (EUS) LINEAR;  Surgeon: Milus Banister, MD;  [facility];  Service: Gastroenterology;  Laterality: N/A;  . EXTRACORPOREAL SHOCK WAVE LITHOTRIPSY  x3  last one 2004 approx.  Marland Kitchen FINE NEEDLE ASPIRATION N/A 05/03/2018   Procedure: FINE NEEDLE ASPIRATION (FNA) LINEAR;  Surgeon: Milus Banister, MD;  [facility];  Service: Gastroenterology;  Laterality: N/A;  . LAPAROSCOPIC NEPHRECTOMY, HAND ASSISTED Right 05/16/2018   Procedure: HAND ASSISTED LAPAROSCOPIC RIGHT NEPHRECTOMY;  Surgeon: Lucas Mallow, MD;   [facility];  Service: Urology;  Laterality: Right;  . left index finger attachment  1980s   3-4 surgeries  . TOE SURGERY  1980s   in beween 2nd and 3rd toes cyst removed  . UPPER GI ENDOSCOPY       Family History  Problem Relation Age of Onset  . Alzheimer's disease Mother   . Alzheimer's disease Father   . Heart disease Father   . Heart attack Father   . Cancer - Other Sister      Social History   Substance and Sexual Activity  Drug Use Never     Social History   Substance and Sexual Activity  Alcohol Use Never     Social History   Tobacco Use  Smoking Status Never Smoker  Smokeless Tobacco Never Used     Outpatient Encounter Medications as of 08/28/2019  Medication Sig Note  . acetaminophen (TYLENOL) 500 MG tablet Take 1,000 mg by mouth every 6 (six) hours as needed for mild pain or headache.    . allopurinol (ZYLOPRIM) 100 MG tablet Take 1 tablet (100 mg total) by mouth every evening.   Marland Kitchen amiodarone (PACERONE) 200 MG tablet Take 1 tablet (200 mg total) by mouth daily.   Marland Kitchen aspirin EC 81 MG EC tablet Take 1 tablet (81 mg total) by mouth daily. (Patient taking differently: Take 81 mg by mouth every evening. )   . atorvastatin (LIPITOR) 40 MG tablet Take 1 tablet (40 mg total) by mouth daily. (Patient taking differently: Take 40 mg by mouth every evening. )   . AURYXIA 1 GM 210 MG(Fe) tablet Take 210 mg by mouth 3 (three) times daily.   . blood glucose meter kit and supplies Dispense based on patient and insurance preference. Use up to four times daily as directed. (FOR ICD-10 E10.9, E11.9).   Marland Kitchen diphenhydramine-acetaminophen (TYLENOL PM) 25-500 MG TABS tablet Take 2 tablets by mouth at bedtime as needed (sleep).   . fluticasone (FLONASE) 50 MCG/ACT nasal spray Place 1-2 sprays into both nostrils daily as needed for allergies or rhinitis.    Marland Kitchen HYDROcodone-acetaminophen (NORCO) 5-325 MG tablet Take 1 tablet by mouth every 4 (four) hours as needed for moderate  pain.   Marland Kitchen LANTUS SOLOSTAR 100 UNIT/ML Solostar Pen Inject 10 Units into the skin at bedtime.   Marland Kitchen loratadine (CLARITIN) 10 MG tablet Take 10 mg by mouth every evening.    . metoprolol tartrate (LOPRESSOR) 25 MG tablet Take 25 mg by mouth daily as needed (for elevated blood pressure (  greater than 130)).   Marland Kitchen nitroGLYCERIN (NITROSTAT) 0.4 MG SL tablet Place 1 tablet (0.4 mg total) under the tongue every 5 (five) minutes as needed for chest pain. 05/09/2019: On hand (Never used)  . omeprazole (PRILOSEC OTC) 20 MG tablet Take 40 mg by mouth daily before breakfast.    . tamsulosin (FLOMAX) 0.4 MG CAPS capsule Take 0.4 mg by mouth every evening.    . warfarin (COUMADIN) 3 MG tablet Take 1 tablet (3 mg total) by mouth every evening. Take as directed by Coumadin Clinic (Patient taking differently: Take 3-4.5 mg by mouth See admin instructions. Take 1 tablet (3 mg) by mouth on Tuesdays & Thursdays. Take 1.5 tablets (4.5 mg) by mouth on Mondays, Wednesdays, Fridays, Saturdays & Sundays.) 07/05/2019: Patient dosing may adjust on 07/09/2019   No facility-administered encounter medications on file as of 08/28/2019.    Allergies: Penicillins  Body mass index is 36.94 kg/m.  Blood pressure 122/79, pulse 91, height '6\' 1"'  (1.854 m), weight 280 lb (127 kg), SpO2 100 %.  Review of Systems  Constitutional: Positive for fatigue. Negative for activity change, appetite change, chills, diaphoresis, fever and unexpected weight change.  Eyes: Negative for visual disturbance.  Respiratory: Negative for cough, chest tightness, shortness of breath, wheezing and stridor.   Cardiovascular: Negative for chest pain, palpitations and leg swelling.  Endocrine: Negative for polydipsia, polyphagia and polyuria.  Genitourinary: Positive for difficulty urinating.  Musculoskeletal: Negative for arthralgias.  Neurological: Negative for dizziness and headaches.  Hematological: Negative for adenopathy. Does not bruise/bleed easily.   Psychiatric/Behavioral: Negative for agitation, behavioral problems, confusion, decreased concentration, dysphoric mood, hallucinations, self-injury, sleep disturbance and suicidal ideas. The patient is not nervous/anxious and is not hyperactive.        Objective:   Physical Exam Vitals and nursing note reviewed.  Constitutional:      General: He is not in acute distress.    Appearance: Normal appearance. He is obese. He is not ill-appearing, toxic-appearing or diaphoretic.  HENT:     Head: Normocephalic and atraumatic.  Cardiovascular:     Rate and Rhythm: Normal rate and regular rhythm.     Pulses: Normal pulses.     Heart sounds: No murmur. No friction rub. No gallop.   Pulmonary:     Effort: Pulmonary effort is normal. No respiratory distress.     Breath sounds: Normal breath sounds. No stridor. No wheezing, rhonchi or rales.  Chest:     Chest wall: No tenderness.  Skin:    General: Skin is warm and dry.     Capillary Refill: Capillary refill takes less than 2 seconds.          Comments:  Fistula present LUE- bruit + thrill +, no signs of infection  Neurological:     Mental Status: He is alert.        Assessment & Plan:   1. Newly diagnosed diabetes ([place])   2. Hyperlipidemia associated with type 2 diabetes mellitus ([place])   3. On statin therapy   4. Elevated LDL cholesterol level   5. Healthcare maintenance   6. Mass of pancreas   7. Paroxysmal atrial fibrillation (H[facility])   8. Essential hypertension   9. Diabetes mellitus type 2 in obese [facility])     Healthcare maintenance Please reduce Lantus from 18 U to 10 U once nightly. Please follow-up with Endocrinology/Dr. Cruzita Lederer ASAP. If your fasting blood sugar is ever <90- call Dr. Cruzita Lederer. Continue all medications as directed. Please keep appt with  Dr. Barry Dienes 4 Jan. Please keep follow-up with Coumadin Clinic and Atrial Fibrillation clinic. Continue to social distance and wear a mask when in public. We will call you  with lab results tomorrow- please check to see if you are taking Atorvastatin to treat high cholesterol. Follow-up here in 3 months.  Mass of pancreas PET 08/05/2019- IMPRESSION: 1. No metabolic activity associated with the lingular nodule. 2. Intense activity in the head of the pancreas associates with the small ovoid intermediate lesion. 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 Recommend endoscopic ultrasound for evaluation and potential sampling 3. Intense metabolic activity associated with a fatty peritoneal lesion beneath the umbilicus. Differential would include postsurgical fatty inflammation versus inflamed epiploic appendage. Metastatic disease is felt unlikely. 4. 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 Pulmonology notes- OV 08/07/2019: Patient here for follow-up after PET scan imaging.  Lung nodule on PET scan was non-PET avid however there is a lesion found within the pancreas.  Discussed this today in the office.    Pt was referred to gastroenterology for endoscopic ultrasound and biopsy of pancreatic lesion- has appt with Dr. Barry Dienes 4 Jan  Atrial fibrillation [facility]) He experienced syncopal event during HD 07/25/2019- He was restarted on Amiordarone 228m QD due to concerns about A Fib On Coumadin  HTN (hypertension) Metoprolol tartrate 217mQD as needed- not currently taking BP today 122/79, HR 91  Diabetes mellitus type 2 in obese [facility]  Component Value Date   HGBA1C 5.1 08/28/2019   HGBA1C 5.1 05/28/2019   HGBA1C 7.4 (A) 02/11/2019  He reports AM BG 86-106, Prior to lunch 71-126, Prior to dinner 94-121 He is currently on Lantus 18 U QHS- which was reduced from 24 U to 18 on  05/28/2019 due to low A1c and BG levels at home- he has yet to follow-up with his Endo/Dr. GhCruzita Ledererill further reduce today- advised to f/u with Endo Message sent to Dr. GhCruzita Lederer  FOLLOW-UP:  Return in about 3 months (around  11/26/2019) for Regular Follow Up, Diabetes, Hypercholestermia.

## 2019-08-28 NOTE — Assessment & Plan Note (Signed)
Please reduce Lantus from 18 U to 10 U once nightly. Please follow-up with Endocrinology/Dr. Cruzita Lederer ASAP. If your fasting blood sugar is ever <90- call Dr. Cruzita Lederer. Continue all medications as directed. Please keep appt with Dr. Barry Dienes 4 Jan. Please keep follow-up with Coumadin Clinic and Atrial Fibrillation clinic. Continue to social distance and wear a mask when in public. We will call you with lab results tomorrow- please check to see if you are taking Atorvastatin to treat high cholesterol. Follow-up here in 3 months.

## 2019-08-28 NOTE — Assessment & Plan Note (Signed)
Metoprolol tartrate 25mg  QD as needed- not currently taking BP today 122/79, HR 91

## 2019-08-29 ENCOUNTER — Other Ambulatory Visit: Payer: Self-pay | Admitting: Internal Medicine

## 2019-08-29 LAB — HEPATIC FUNCTION PANEL
ALT: 11 IU/L (ref 0–44)
AST: 20 IU/L (ref 0–40)
Albumin: 4.2 g/dL (ref 3.8–4.8)
Alkaline Phosphatase: 124 IU/L — ABNORMAL HIGH (ref 39–117)
Bilirubin Total: 0.4 mg/dL (ref 0.0–1.2)
Bilirubin, Direct: 0.1 mg/dL (ref 0.00–0.40)
Total Protein: 6.8 g/dL (ref 6.0–8.5)

## 2019-08-29 LAB — LIPID PANEL
Chol/HDL Ratio: 4.9 ratio (ref 0.0–5.0)
Cholesterol, Total: 141 mg/dL (ref 100–199)
HDL: 29 mg/dL — ABNORMAL LOW (ref >39)
LDL Chol Calc (NIH): 83 mg/dL (ref 0–99)
Triglycerides: 165 mg/dL — ABNORMAL HIGH (ref 0–149)
VLDL Cholesterol Cal: 29 mg/dL (ref 5–40)

## 2019-09-02 ENCOUNTER — Telehealth: Payer: Self-pay | Admitting: *Deleted

## 2019-09-02 NOTE — Telephone Encounter (Signed)
-----   Message from Esaw Grandchild, NP sent at 09/02/2019  1:27 PM EST ----- Good Afternoon Demarkis Gheen, Can you please call Ms. Heskett and share- Hepatic fx panel and Lipid panel- stable. Continue current mediation regime (recently dx'd with pancreatic malignancy-has appt with Dr. Barry Dienes today). Thanks! Valetta Fuller

## 2019-09-02 NOTE — Telephone Encounter (Signed)
Tried to contact pt/pts wife to inform them of below and the mailbox was full so I was unable to LM to ask them to call back.  If they call back please inform them of below.Taryn Nave Zimmerman Rumple, CMA

## 2019-09-03 NOTE — Telephone Encounter (Signed)
Patient is aware of lab results. AS< CMA

## 2019-09-05 ENCOUNTER — Other Ambulatory Visit: Payer: Self-pay | Admitting: Internal Medicine

## 2019-09-06 MED ORDER — WARFARIN SODIUM 3 MG PO TABS: ORAL_TABLET | ORAL | 0 refills | Status: DC

## 2019-09-09 ENCOUNTER — Other Ambulatory Visit: Payer: Self-pay

## 2019-09-13 ENCOUNTER — Encounter: Payer: Self-pay | Admitting: Internal Medicine

## 2019-09-13 ENCOUNTER — Ambulatory Visit: Payer: PRIVATE HEALTH INSURANCE | Admitting: Internal Medicine

## 2019-09-13 VITALS — BP 118/60 | HR 104 | Ht 73.0 in | Wt 274.0 lb

## 2019-09-13 DIAGNOSIS — E782 Mixed hyperlipidemia: Secondary | ICD-10-CM

## 2019-09-13 DIAGNOSIS — Z794 Long term (current) use of insulin: Secondary | ICD-10-CM | POA: Diagnosis not present

## 2019-09-13 DIAGNOSIS — E669 Obesity, unspecified: Secondary | ICD-10-CM

## 2019-09-13 DIAGNOSIS — Z992 Dependence on renal dialysis: Secondary | ICD-10-CM

## 2019-09-13 DIAGNOSIS — N186 End stage renal disease: Secondary | ICD-10-CM

## 2019-09-13 DIAGNOSIS — E1122 Type 2 diabetes mellitus with diabetic chronic kidney disease: Secondary | ICD-10-CM | POA: Diagnosis not present

## 2019-09-13 NOTE — Progress Notes (Addendum)
Patient ID: George Gramling., male   DOB: 1955-12-21, 64 y.o.   MRN: [id number]   This visit occurred during the SARS-CoV-2 public health emergency.  Safety protocols were in place, including screening questions prior to the visit, additional usage of staff PPE, and extensive cleaning of exam room while observing appropriate contact time as indicated for disinfecting solutions.   HPI: George Matt. is a 64 y.o.-year-old male,old male, referred by his PCP, [place], Berna Spare, NP, for management of DM2, dx in 11/2018, insulin-dependent, uncontrolled, without long-term complications.  Last visit 7 months ago.  Since last visit, he was diagnosed with a pancreatic mass seen on PET scan.  This is suspicious for an islet cell tumor. He may not need surgery.  He started HD in 02/2019.  He started to change his diet afterwards and eliminate sweets and he is also exercising regularly >> he lost 30 pounds since last visit.  Reviewed and addended history: He was dx with DM in 11/2018.  He was admitted at that time with a glucose in the 900s, hydrated, and started on insulin.  He was discharged on insulin but his sugars improved and he even developed hypoglycemia.  His insulin doses were decreased approximately 1 week ago but he continues to have low blood sugars and to feel dizzy and tired and has blurry vision.  Reviewed her HbA1c levels: Lab Results  Component Value Date   HGBA1C 5.1 08/28/2019   HGBA1C 5.1 05/28/2019   HGBA1C 7.4 (A) 02/11/2019   HGBA1C 14.9 (H) 12/19/2018   Pt is on a regimen of: -  >> stopped 2/2 CKD - Lantus 36 >> 32 units 2x a day >> 26 >> 18 >> 10 units daily -  >> stopped  Pt checks his sugars 3 times a day: - am: 85-115 >> 77-102 - 2h after b'fast: n/c - before lunch: 68-96, 212 >> 69-105, 126 - 2h after lunch: 52, 58, 76-135 >> n/c - before dinner: 59-133 >> 67-121, 131, 153 - 2h after dinner: 67-144 >> n/c - bedtime: n/c >> 88-103 >> n/c - nighttime: n/c Lowest  sugar was 52 >> 67; he has hypoglycemia awareness at 70. Highest sugar was 926 >> 153.  Glucometer: Accu-Chek Guide  Pt's meals are: - Breakfast: egg x1, [place] sausage or liver pudding, toast - Lunch: grilled chicken + salad, pinneapple - Dinner: grilled chicken or fish, hamburger, salad or veggies - Snacks, fruit (  -+ ESRD, s/p R nephrectomy 2019 for RCC - sees Dr. Hollie Salk - now on HD TTS- last BUN/creatinine:  Lab Results  Component Value Date   BUN 54 (H) 07/15/2019   BUN 75 (H) 04/23/2019   CREATININE 4.70 (H) 07/15/2019   CREATININE 5.43 (H) 04/23/2019   -+ HL; last set of lipids: Lab Results  Component Value Date   CHOL 141 08/28/2019   HDL 29 (L) 08/28/2019   LDLCALC 83 08/28/2019   TRIG 165 (H) 08/28/2019   CHOLHDL 4.9 08/28/2019   - last eye exam was 2020: no DR  - no numbness and tingling in his feet.  Pt has no FH of DM.  He also has hepatic steatosis.  ROS: Constitutional: no weight gain/+ weight loss, + fatigue, no subjective hyperthermia, no subjective hypothermia Eyes: no blurry vision, no xerophthalmia ENT: no sore throat, no nodules palpated in neck, no dysphagia, no odynophagia, no hoarseness Cardiovascular: no CP/no SOB/no palpitations/no leg swelling Respiratory: no cough/no SOB/no wheezing Gastrointestinal: no N/no V/no D/no C/no acid  reflux Musculoskeletal: no muscle aches/no joint aches Skin: no rashes, no hair loss Neurological: no tremors/no numbness/no tingling/no dizziness  I reviewed pt's medications, allergies, PMH, social hx, family hx, and changes were documented in the history of present illness. Otherwise, unchanged from my initial visit note.  Past Medical History:  Diagnosis Date  . Anemia   . Arthritis    knees  . Atrial fibrillation ([place])   . Bilateral renal cysts 03/23/2018   Noted MRI ABD  . Cancer [facility])    right kidney cancer  . Cancer [facility])    pancreatic  . Cholelithiasis 03/19/2018   noted on CT AB/Pelvis  . CKD  (chronic kidney disease), stage IV [facility])    nephrologist-- dr Hollie Salk [name]);  05-01-2018 has not started dialysis, scheduled for AV fistula creation 05-07-2018  . Diabetes mellitus without complication ([place])   . Diverticulosis of colon 04/19/2018   Noted on CT abd/pelvis  . GERD (gastroesophageal reflux disease)   . Hepatic steatosis 03/19/2018   Mild diffuse, noted on CT AB/Pelvis  . History of kidney stones   . History of pulmonary embolus (PE) 03/2008   treated with coumadin for 6 months  . History of sepsis 04/20/2018   per discharge note , probable UTI  . Hypertension   . IBS (irritable bowel syndrome)   . Nocturia   . OSA on CPAP    uses CPAP nightly  . Pancreatic lesion    0.4cm cystic per CT 07/ 2019  . Peripheral vascular disease ([place])   . Pneumonia    x 1  . Pulmonary nodule    Solid 78m Right Lower Lobe  . Right renal mass 04/10/2018   new dx--  scheduled for nephrectomy 05-16-2018  . S/P ureteral stent placement: 04/16/2018 04/19/2018   Past Surgical History:  Procedure Laterality Date  . AV FISTULA PLACEMENT Left 05/07/2018   Procedure: Creation of Left arm Radiocephalic Fistula;  Surgeon: CMarty Heck MD;  [place] [facility]  Service: Vascular;  Laterality: Left;  . AV FISTULA PLACEMENT Left 05/15/2019   Procedure: Creation of Brachiocephalic fistula left arm;  Surgeon: CWaynetta Sandy MD;  [place] [facility]  Service: Vascular;  Laterality: Left;  . BASCILIC VEIN TRANSPOSITION Left 07/15/2019   Procedure: BASILIC VEIN TRANSPOSITION SECOND STAGE;  Surgeon: CWaynetta Sandy MD;  [place] [facility]  Service: Vascular;  Laterality: Left;  . COLONOSCOPY    . CYSTOSCOPY/RETROGRADE/URETEROSCOPY/STONE EXTRACTION WITH BASKET  x2 last one 1990s approx.  . CYSTOSCOPY/URETEROSCOPY/HOLMIUM LASER/STENT PLACEMENT Left 04/16/2018   Procedure: CYSTOSCOPY/LEFT URETEROSCOPY/LEFT RETROGRADE/STENT PLACEMENT;  Surgeon: BLucas Mallow MD;  [facility];  Service: Urology;  Laterality: Left;  . EUS N/A 05/03/2018   Procedure: UPPER ENDOSCOPIC ULTRASOUND (EUS) RADIAL;  Surgeon: JMilus Banister MD;  [facility];  Service: Gastroenterology;  Laterality: N/A;  . EUS N/A 05/03/2018   Procedure: UPPER ENDOSCOPIC ULTRASOUND (EUS) LINEAR;  Surgeon: JMilus Banister MD;  [facility];  Service: Gastroenterology;  Laterality: N/A;  . EXTRACORPOREAL SHOCK WAVE LITHOTRIPSY  x3  last one 2004 approx.  .Marland KitchenFINE NEEDLE ASPIRATION N/A 05/03/2018   Procedure: FINE NEEDLE ASPIRATION (FNA) LINEAR;  Surgeon: JMilus Banister MD;  [facility];  Service: Gastroenterology;  Laterality: N/A;  . LAPAROSCOPIC NEPHRECTOMY, HAND ASSISTED Right 05/16/2018   Procedure: HAND ASSISTED LAPAROSCOPIC RIGHT NEPHRECTOMY;  Surgeon: BLucas Mallow MD;  [facility];  Service: Urology;  Laterality: Right;  . left index finger attachment  1980s   3-4 surgeries  . TOE SURGERY  1980s   in beween 2nd and 3rd toes cyst removed  . UPPER GI ENDOSCOPY     Social History   Socioeconomic History  . Marital status: Married    Spouse name: Not on file  . Number of children: Not on file  . Years of education: Not on file  . Highest education level: Not on file  Occupational History  . Occupation: disabled  Tobacco Use  . Smoking status: Never Smoker  . Smokeless tobacco: Never Used  Substance and Sexual Activity  . Alcohol use: Never  . Drug use: Never  . Sexual activity: Not on file  Other Topics Concern  . Not on file  Social History Narrative  . Not on file   Social Determinants of Health   Financial Resource Strain:   . Difficulty of Paying Living Expenses: Not on file  Food Insecurity:   . Worried About Charity fundraiser in the Last Year: Not on file  . Ran Out of Food in the Last Year: Not on file  Transportation Needs:   . Lack of Transportation (Medical): Not on file  . Lack of Transportation (Non-Medical): Not on file   Physical Activity:   . Days of Exercise per Week: Not on file  . Minutes of Exercise per Session: Not on file  Stress:   . Feeling of Stress : Not on file  Social Connections:   . Frequency of Communication with Friends and Family: Not on file  . Frequency of Social Gatherings with Friends and Family: Not on file  . Attends Religious Services: Not on file  . Active Member of Clubs or Organizations: Not on file  . Attends Archivist Meetings: Not on file  . Marital Status: Not on file  Intimate Partner Violence:   . Fear of Current or Ex-Partner: Not on file  . Emotionally Abused: Not on file  . Physically Abused: Not on file  . Sexually Abused: Not on file   Medications: - Metformin 1000 mg 2x a day, with meals - Lantus 32 units 2x a day  - Humalog 8-10 units 3x a day, before meals  Also: Current Outpatient Medications on File Prior to Visit  Medication Sig Dispense Refill  . acetaminophen (TYLENOL) 500 MG tablet Take 1,000 mg by mouth every 6 (six) hours as needed for mild pain or headache.     . allopurinol (ZYLOPRIM) 100 MG tablet Take 1 tablet (100 mg total) by mouth every evening. 30 tablet 0  . amiodarone (PACERONE) 200 MG tablet Take 1 tablet (200 mg total) by mouth daily. 30 tablet 3  . aspirin EC 81 MG EC tablet Take 1 tablet (81 mg total) by mouth daily. (Patient taking differently: Take 81 mg by mouth every evening. ) 30 tablet 0  . atorvastatin (LIPITOR) 40 MG tablet Take 1 tablet (40 mg total) by mouth daily. (Patient taking differently: Take 40 mg by mouth every evening. ) 30 tablet 0  . AURYXIA 1 GM 210 MG(Fe) tablet Take 210 mg by mouth 3 (three) times daily.    . blood glucose meter kit and supplies Dispense based on patient and insurance preference. Use up to four times daily as directed. (FOR ICD-10 E10.9, E11.9). 1 each 0  . diphenhydramine-acetaminophen (TYLENOL PM) 25-500 MG TABS tablet Take 2 tablets by mouth at bedtime as needed (sleep).    .  fluticasone (FLONASE) 50 MCG/ACT nasal spray Place 1-2  sprays into both nostrils daily as needed for allergies or rhinitis.     Marland Kitchen HYDROcodone-acetaminophen (NORCO) 5-325 MG tablet Take 1 tablet by mouth every 4 (four) hours as needed for moderate pain. 6 tablet 0  . LANTUS SOLOSTAR 100 UNIT/ML Solostar Pen Inject 10 Units into the skin at bedtime.    Marland Kitchen loratadine (CLARITIN) 10 MG tablet Take 10 mg by mouth every evening.     . metoprolol tartrate (LOPRESSOR) 25 MG tablet Take 25 mg by mouth daily as needed (for elevated blood pressure (greater than 130)).    Marland Kitchen nitroGLYCERIN (NITROSTAT) 0.4 MG SL tablet Place 1 tablet (0.4 mg total) under the tongue every 5 (five) minutes as needed for chest pain. 14 tablet 12  . omeprazole (PRILOSEC OTC) 20 MG tablet Take 40 mg by mouth daily before breakfast.     . tamsulosin (FLOMAX) 0.4 MG CAPS capsule Take 0.4 mg by mouth every evening.   3  . warfarin (COUMADIN) 3 MG tablet Take 1 to 1.5 tablets by mouth daily as directed by coumadin clinic 135 tablet 0   No current facility-administered medications on file prior to visit.   Allergies  Allergen Reactions  . Penicillins Rash and Other (See Comments)    Has patient had a PCN reaction causing immediate rash, facial/tongue/throat swelling, SOB or lightheadedness with hypotension: yes Has patient had a PCN reaction causing severe rash involving mucus membranes or skin necrosis: no Has patient had a PCN reaction that required hospitalization: no Has patient had a PCN reaction occurring within the last 10 years: no If all of the above answers are "NO", then may proceed with Cephalosporin use.    Family History  Problem Relation Age of Onset  . Alzheimer's disease Mother   . Alzheimer's disease Father   . Heart disease Father   . Heart attack Father   . Cancer - Other Sister    PE: BP 118/60   Pulse (!) 104   Ht 6' 1" (1.854 m) Comment: measured  Wt 274 lb (124.3 kg)   SpO2 98%   BMI 36.15 kg/m  Wt  Readings from Last 3 Encounters:  09/13/19 274 lb (124.3 kg)  08/28/19 280 lb (127 kg)  08/09/19 274 lb (124.3 kg)   Constitutional: overweight, in NAD Eyes: PERRLA, EOMI, no exophthalmos ENT: moist mucous membranes, no thyromegaly, no cervical lymphadenopathy Cardiovascular: Tachycardia, RR, No MRG Respiratory: CTA B Gastrointestinal: abdomen soft, NT, ND, BS+ Musculoskeletal: no deformities, strength intact in all 4 Skin: moist, warm, no rashes Neurological: no tremor with outstretched hands, DTR normal in all 4  ASSESSMENT: 1. DM2, insulin-dependent, uncontrolled, without long-term complications  2. HL  3. Obesity class 2  PLAN:  1. Patient with recently diagnosed diabetes, before our last visit, previously on basal-bolus insulin regimen, now off rapid acting insulin.  Also, the dose of Lantus was decreased due to low blood sugars.  Currently, he is taking only 10 units of Lantus every day. -At this visit, we reviewed his carefully kept logs: Sugars are almost entirely normal, mostly in the 80s to 100 range.  Occasionally, he has blood sugars in the 70s or even 60s.  Therefore, it is point, we can start reducing the Lantus and I am hoping we can stop it completely, soon.  However, I would first want to check him for insulin deficiency and pancreatic biopsy will check these labs today. -At next visit, his sugars start increasing, we may need to add a GLP-1 receptor agonist.  He does not have a history of pancreatitis or family history of medullary thyroid cancer.  We are otherwise limited in the treatment options due to his advanced kidney failure - I suggested to:  Patient Instructions  Please decrease: - Lantus to 5 units once a day at bedtime  Please return in 4 months with your sugar log.   - advised to check sugars at different times of the day - 2-3x a day, rotating check times - advised for yearly eye exams >> he is UTD - return to clinic in 3-4 months  2. HL - Reviewed  latest lipid panel from 07/2019: LDL close to goal, TG a little high, HDL low Lab Results  Component Value Date   CHOL 141 08/28/2019   HDL 29 (L) 08/28/2019   LDLCALC 83 08/28/2019   TRIG 165 (H) 08/28/2019   CHOLHDL 4.9 08/28/2019  - Continues the statin - Lipitor 40 without side effects.  3. Obesity class 2 - lost 30 lbs since last OV >> changed his diet - will try to stop insulin which should also help him lose weight  When the labs return, if there is no sign of type 1 diabetes, I will ask him to stop Lantus completely.  Component     Latest Ref Rng & Units 09/13/2019  Glucose, Plasma     65 - 99 mg/dL 118 (H)  C-Peptide     0.80 - 3.85 ng/mL 16.30 (H)  ZNT8 Antibodies     U/mL <15  Glutamic Acid Decarb Ab     <5 IU/mL <5  Islet Cell Ab     Neg:<1:1 Negative   Will stop insulin completely since investigation for type 1 diabetes is negative.  George Kingdom, MD PhD [facility]

## 2019-09-13 NOTE — Patient Instructions (Addendum)
Please decrease: - Lantus to 5 units once a day at bedtime  Please stop at the lab.  Please return in 4 months with your sugar log. [phone]

## 2019-09-17 LAB — GLUTAMIC ACID DECARBOXYLASE AUTO ABS: Glutamic Acid Decarb Ab: <5 IU/mL (ref <5)

## 2019-09-17 LAB — C-PEPTIDE: C-Peptide: 16.3 ng/mL — ABNORMAL HIGH (ref 0.80–3.85)

## 2019-09-17 LAB — GLUCOSE, FASTING: Glucose, Plasma: 118 mg/dL — ABNORMAL HIGH (ref 65–99)

## 2019-09-20 LAB — ANTI-ISLET CELL ANTIBODY: Islet Cell Ab: NEGATIVE

## 2019-09-20 LAB — ZNT8 ANTIBODIES: ZNT8 Antibodies: <15 U/mL

## 2019-09-24 ENCOUNTER — Telehealth: Payer: Self-pay | Admitting: Internal Medicine

## 2019-09-24 ENCOUNTER — Ambulatory Visit (INDEPENDENT_AMBULATORY_CARE_PROVIDER_SITE_OTHER): Payer: PRIVATE HEALTH INSURANCE | Admitting: *Deleted

## 2019-09-24 ENCOUNTER — Other Ambulatory Visit: Payer: Self-pay

## 2019-09-24 DIAGNOSIS — Z7901 Long term (current) use of anticoagulants: Secondary | ICD-10-CM | POA: Diagnosis not present

## 2019-09-24 DIAGNOSIS — I4891 Unspecified atrial fibrillation: Secondary | ICD-10-CM

## 2019-09-24 LAB — POCT INR: INR: 2.8 (ref 2.0–3.0)

## 2019-09-24 NOTE — Patient Instructions (Signed)
11/27  Started Amiodarone 200mg  daily Continue taking 1.5 tablets daily except 1 tablet each Tuesday and Thursday.  Repeat INR in 4 weeks

## 2019-09-24 NOTE — Telephone Encounter (Signed)
Patient's wife Vaughan Basta requests to be called at ph# [phone] to be given patient's lab results and to discuss medication/dosage (Lantus).

## 2019-09-25 NOTE — Telephone Encounter (Signed)
I am still waiting for 1 antibody to return. The rest of the labs point towards type 2, not type 1 DM. Can you please check with them what Q they have about Lantus? We have been tapering this down.

## 2019-09-25 NOTE — Telephone Encounter (Signed)
Dr. Cruzita Lederer has not yet reviewed the results.

## 2019-09-30 NOTE — Telephone Encounter (Signed)
Called pt's wife and she stated that she wanted to know if pt's insulin dose would decrease further from 5 units. She stated that Dr. Cruzita Lederer stated that blood work would determine whether pt is to decrease further. I did advise pt's wife that Dr. Cruzita Lederer stated that she was still waiting on one antibody test to come back. Pt's wife verbalized understanding and stated that once that result comes back, someone can inform her about the Lantus as well. She stated that she did not need two phone calls and she understands it may take a while.

## 2019-09-30 NOTE — Telephone Encounter (Signed)
Called pt's wife again and she answered this time. She was given MD message about stopping Lantus as well as result note. She verbalized understanding and did not have any further questions.

## 2019-09-30 NOTE — Telephone Encounter (Signed)
Attempted to call pt's wife back and inform her of this. Pt's wife did not answer. Voicemail box was full. Will attempt to call again later.

## 2019-09-30 NOTE — Telephone Encounter (Signed)
George Lewis, Can you please advised him to stop the insulin completely if sugars are controlled. He can watch the sugars afterwards and we can always add it back, daily increase.  I will let them know about the remaining on the body, but it does take very long to come back and something may be wrong with the order.  I will need to check on this.

## 2019-10-07 ENCOUNTER — Ambulatory Visit (HOSPITAL_COMMUNITY)
Admission: RE | Admit: 2019-10-07 | Discharge: 2019-10-07 | Disposition: A | Discharge status: Home / Self Care | Payer: PRIVATE HEALTH INSURANCE | Source: Ambulatory Visit | Attending: Physician Assistant | Admitting: Physician Assistant

## 2019-10-07 ENCOUNTER — Other Ambulatory Visit: Payer: Self-pay

## 2019-10-07 VITALS — BP 108/64 | HR 85 | Ht 73.0 in | Wt 282.8 lb

## 2019-10-07 DIAGNOSIS — Z79899 Other long term (current) drug therapy: Secondary | ICD-10-CM | POA: Diagnosis not present

## 2019-10-07 DIAGNOSIS — I129 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 kidney disease, or unspecified chronic kidney disease: Secondary | ICD-10-CM | POA: Insufficient documentation

## 2019-10-07 DIAGNOSIS — N186 End stage renal disease: Secondary | ICD-10-CM | POA: Insufficient documentation

## 2019-10-07 DIAGNOSIS — D649 Anemia, unspecified: Secondary | ICD-10-CM | POA: Diagnosis not present

## 2019-10-07 DIAGNOSIS — D6869 Other thrombophilia: Secondary | ICD-10-CM | POA: Insufficient documentation

## 2019-10-07 DIAGNOSIS — K219 Gastro-esophageal reflux disease without esophagitis: Secondary | ICD-10-CM | POA: Diagnosis not present

## 2019-10-07 DIAGNOSIS — K589 Irritable bowel syndrome without diarrhea: Secondary | ICD-10-CM | POA: Diagnosis not present

## 2019-10-07 DIAGNOSIS — E119 Type 2 diabetes mellitus without complications: Secondary | ICD-10-CM | POA: Diagnosis not present

## 2019-10-07 DIAGNOSIS — G4733 Obstructive sleep apnea (adult) (pediatric): Secondary | ICD-10-CM | POA: Diagnosis not present

## 2019-10-07 DIAGNOSIS — Z7901 Long term (current) use of anticoagulants: Secondary | ICD-10-CM | POA: Diagnosis not present

## 2019-10-07 DIAGNOSIS — N184 Chronic kidney disease, stage 4 (severe): Secondary | ICD-10-CM | POA: Diagnosis not present

## 2019-10-07 DIAGNOSIS — I480 Paroxysmal atrial fibrillation: Secondary | ICD-10-CM | POA: Insufficient documentation

## 2019-10-07 DIAGNOSIS — Z7982 Long term (current) use of aspirin: Secondary | ICD-10-CM | POA: Insufficient documentation

## 2019-10-07 LAB — TSH: TSH: 1.659 u[IU]/mL (ref 0.350–4.500)

## 2019-10-07 MED ORDER — AMIODARONE HCL 100 MG PO TABS: 100.0000 mg | ORAL_TABLET | Freq: Every day | ORAL | 3 refills | Status: DC

## 2019-10-07 NOTE — Progress Notes (Addendum)
Primary Care Physician: Esaw Grandchild, NP Primary Cardiologist: Dr Margaretann Loveless  Primary Electrophysiologist: none Referring Physician: Dr Christy Sartorius. is a 64 y.o. male with a history of right renal cell carcinomas/pnephrectomy in 9/19, ESRD on HD(TTS), HTN,HLD,SVT, DM type II, OSA on CPAP,morbid obesity, and paroxysmal atrial fibrillation who presents for follow up in the [facility].  The patient was initially diagnosed with atrial fibrillation 04/22/19 while he was hospitalized for diarrhea/food poisoning. Telemetry initially showed afib with RVR with rates up to 200 bpm. His electrolytes were within normal limits. Echo 02/26/19 showed normal EF 60-65% with mild LAE. Patient was started on amiodarone and coumadin for a CHADS2VASC score of 3. He does have OSA and is on CPAP.  On follow up today, patient reports that he has done well from a cardiac standpoint. He did have one episode of afib after HD and significant hypotension which resolved quickly. He was placed back on amiodarone by Dr Margaretann Loveless. He denies any other episodes of afib. He is tolerating the medication without difficulty.   Today, he denies symptoms of palpitations, chest pain, shortness of breath, orthopnea, PND, lower extremity edema, dizziness, presyncope, syncope, snoring, daytime somnolence, bleeding, or neurologic sequela. The patient is tolerating medications without difficulties and is otherwise without complaint today.   Atrial Fibrillation Risk Factors:  he does have symptoms or diagnosis of sleep apnea. he is compliant with CPAP therapy.   he has a BMI of Body mass index is 37.31 kg/m.Marland Kitchen Filed Weights   10/07/19 0921  Weight: 128.3 kg    Family History  Problem Relation Age of Onset  . Alzheimer's disease Mother   . Alzheimer's disease Father   . Heart disease Father   . Heart attack Father   . Cancer - Other Sister      Atrial Fibrillation Management  history:  Previous antiarrhythmic drugs: amiodarone Previous cardioversions: none Previous ablations: none CHADS2VASC score: 3 Anticoagulation history: warfarin   Past Medical History:  Diagnosis Date  . Anemia   . Arthritis    knees  . Atrial fibrillation ([place])   . Bilateral renal cysts 03/23/2018   Noted MRI ABD  . Cancer [facility])    right kidney cancer  . Cancer [facility])    pancreatic  . Cholelithiasis 03/19/2018   noted on CT AB/Pelvis  . CKD (chronic kidney disease), stage IV [facility])    nephrologist-- dr Hollie Salk [name]);  05-01-2018 has not started dialysis, scheduled for AV fistula creation 05-07-2018  . Diabetes mellitus without complication ([place])   . Diverticulosis of colon 04/19/2018   Noted on CT abd/pelvis  . GERD (gastroesophageal reflux disease)   . Hepatic steatosis 03/19/2018   Mild diffuse, noted on CT AB/Pelvis  . History of kidney stones   . History of pulmonary embolus (PE) 03/2008   treated with coumadin for 6 months  . History of sepsis 04/20/2018   per discharge note , probable UTI  . Hypertension   . IBS (irritable bowel syndrome)   . Nocturia   . OSA on CPAP    uses CPAP nightly  . Pancreatic lesion    0.4cm cystic per CT 07/ 2019  . Peripheral vascular disease ([place])   . Pneumonia    x 1  . Pulmonary nodule    Solid 53m Right Lower Lobe  . Right renal mass 04/10/2018   new dx--  scheduled for nephrectomy 05-16-2018  . S/P ureteral stent placement: 04/16/2018  04/19/2018   Past Surgical History:  Procedure Laterality Date  . AV FISTULA PLACEMENT Left 05/07/2018   Procedure: Creation of Left arm Radiocephalic Fistula;  Surgeon: Marty Heck, MD;  Location: [place];  Service: Vascular;  Laterality: Left;  . AV FISTULA PLACEMENT Left 05/15/2019   Procedure: Creation of Brachiocephalic fistula left arm;  Surgeon: Waynetta Sandy, MD;  Location: [place];  Service: Vascular;  Laterality: Left;  . BASCILIC VEIN TRANSPOSITION Left  07/15/2019   Procedure: BASILIC VEIN TRANSPOSITION SECOND STAGE;  Surgeon: Waynetta Sandy, MD;  Location: [place];  Service: Vascular;  Laterality: Left;  . COLONOSCOPY    . CYSTOSCOPY/RETROGRADE/URETEROSCOPY/STONE EXTRACTION WITH BASKET  x2 last one 1990s approx.  . CYSTOSCOPY/URETEROSCOPY/HOLMIUM LASER/STENT PLACEMENT Left 04/16/2018   Procedure: CYSTOSCOPY/LEFT URETEROSCOPY/LEFT RETROGRADE/STENT PLACEMENT;  Surgeon: Lucas Mallow, MD;  Location: [facility];  Service: Urology;  Laterality: Left;  . EUS N/A 05/03/2018   Procedure: UPPER ENDOSCOPIC ULTRASOUND (EUS) RADIAL;  Surgeon: Milus Banister, MD;  Location: [facility];  Service: Gastroenterology;  Laterality: N/A;  . EUS N/A 05/03/2018   Procedure: UPPER ENDOSCOPIC ULTRASOUND (EUS) LINEAR;  Surgeon: Milus Banister, MD;  Location: [facility];  Service: Gastroenterology;  Laterality: N/A;  . EXTRACORPOREAL SHOCK WAVE LITHOTRIPSY  x3  last one 2004 approx.  Marland Kitchen FINE NEEDLE ASPIRATION N/A 05/03/2018   Procedure: FINE NEEDLE ASPIRATION (FNA) LINEAR;  Surgeon: Milus Banister, MD;  Location: [facility];  Service: Gastroenterology;  Laterality: N/A;  . LAPAROSCOPIC NEPHRECTOMY, HAND ASSISTED Right 05/16/2018   Procedure: HAND ASSISTED LAPAROSCOPIC RIGHT NEPHRECTOMY;  Surgeon: Lucas Mallow, MD;  Location: [facility];  Service: Urology;  Laterality: Right;  . left index finger attachment  1980s   3-4 surgeries  . TOE SURGERY  1980s   in beween 2nd and 3rd toes cyst removed  . UPPER GI ENDOSCOPY      Current Outpatient Medications  Medication Sig Dispense Refill  . Accu-Chek FastClix Lancets MISC     . acetaminophen (TYLENOL) 500 MG tablet Take 1,000 mg by mouth every 6 (six) hours as needed for mild pain or headache.     . allopurinol (ZYLOPRIM) 100 MG tablet Take 1 tablet (100 mg total) by mouth every evening. 30 tablet 0  . amiodarone (PACERONE) 100 MG tablet Take 1 tablet (100 mg total) by mouth daily. 30 tablet 3  . aspirin  EC 81 MG EC tablet Take 1 tablet (81 mg total) by mouth daily. (Patient taking differently: Take 81 mg by mouth every evening. ) 30 tablet 0  . atorvastatin (LIPITOR) 40 MG tablet Take 1 tablet (40 mg total) by mouth daily. (Patient taking differently: Take 40 mg by mouth every evening. ) 30 tablet 0  . AURYXIA 1 GM 210 MG(Fe) tablet Take 210 mg by mouth 3 (three) times daily.    . blood glucose meter kit and supplies Dispense based on patient and insurance preference. Use up to four times daily as directed. (FOR ICD-10 E10.9, E11.9). 1 each 0  . diphenhydramine-acetaminophen (TYLENOL PM) 25-500 MG TABS tablet Take 2 tablets by mouth at bedtime as needed (sleep).    . fluticasone (FLONASE) 50 MCG/ACT nasal spray Place 1-2 sprays into both nostrils daily as needed for allergies or rhinitis.     Marland Kitchen HYDROcodone-acetaminophen (NORCO) 5-325 MG tablet Take 1 tablet by mouth every 4 (four) hours as needed for moderate pain. 6 tablet 0  . Lidocaine-Prilocaine (EMLA EX) EMLA 2.5-2.5%    .  loratadine (CLARITIN) 10 MG tablet Take 10 mg by mouth every evening.     . metoprolol tartrate (LOPRESSOR) 25 MG tablet Take 25 mg by mouth daily as needed (for elevated blood pressure (greater than 130)).    . midodrine (PROAMATINE) 10 MG tablet Take 10 mg by mouth 3 (three) times a week. Taking 30 mins prior to Dialysis    . nitroGLYCERIN (NITROSTAT) 0.4 MG SL tablet Place 1 tablet (0.4 mg total) under the tongue every 5 (five) minutes as needed for chest pain. 14 tablet 12  . omeprazole (PRILOSEC OTC) 20 MG tablet Take 40 mg by mouth daily before breakfast.     . warfarin (COUMADIN) 3 MG tablet Take 1 to 1.5 tablets by mouth daily as directed by coumadin clinic 135 tablet 0   No current facility-administered medications for this encounter.    Allergies  Allergen Reactions  . Penicillins Rash and Other (See Comments)    Has patient had a PCN reaction causing immediate rash, facial/tongue/throat swelling, SOB or  lightheadedness with hypotension: yes Has patient had a PCN reaction causing severe rash involving mucus membranes or skin necrosis: no Has patient had a PCN reaction that required hospitalization: no Has patient had a PCN reaction occurring within the last 10 years: no If all of the above answers are "NO", then may proceed with Cephalosporin use.     Social History   Socioeconomic History  . Marital status: Married    Spouse name: Not on file  . Number of children: Not on file  . Years of education: Not on file  . Highest education level: Not on file  Occupational History  . Occupation: disabled  Tobacco Use  . Smoking status: Never Smoker  . Smokeless tobacco: Never Used  Substance and Sexual Activity  . Alcohol use: Never  . Drug use: Never  . Sexual activity: Not on file  Other Topics Concern  . Not on file  Social History Narrative  . Not on file   Social Determinants of Health   Financial Resource Strain:   . Difficulty of Paying Living Expenses: Not on file  Food Insecurity:   . Worried About Charity fundraiser in the Last Year: Not on file  . Ran Out of Food in the Last Year: Not on file  Transportation Needs:   . Lack of Transportation (Medical): Not on file  . Lack of Transportation (Non-Medical): Not on file  Physical Activity:   . Days of Exercise per Week: Not on file  . Minutes of Exercise per Session: Not on file  Stress:   . Feeling of Stress : Not on file  Social Connections:   . Frequency of Communication with Friends and Family: Not on file  . Frequency of Social Gatherings with Friends and Family: Not on file  . Attends Religious Services: Not on file  . Active Member of Clubs or Organizations: Not on file  . Attends Archivist Meetings: Not on file  . Marital Status: Not on file  Intimate Partner Violence:   . Fear of Current or Ex-Partner: Not on file  . Emotionally Abused: Not on file  . Physically Abused: Not on file  .  Sexually Abused: Not on file     ROS- All systems are reviewed and negative except as per the HPI above.  Physical Exam: Vitals:   10/07/19 0921  BP: 108/64  Pulse: 85  Weight: 128.3 kg  Height: '6\' 1"'  (1.854 m)  GEN- The patient is well appearing obese male, alert and oriented x 3 today.   HEENT-head normocephalic, atraumatic, sclera clear, conjunctiva pink, hearing intact, trachea midline. Lungs- Clear to ausculation bilaterally, normal work of breathing Heart- Regular rate and rhythm, no murmurs, rubs or gallops  GI- soft, NT, ND, + BS Extremities- no clubbing, cyanosis, or edema MS- no significant deformity or atrophy Skin- no rash or lesion Psych- euthymic mood, full affect Neuro- strength and sensation are intact   Wt Readings from Last 3 Encounters:  10/07/19 128.3 kg  09/13/19 124.3 kg  08/28/19 127 kg    EKG today demonstrates SR HR 85, PR 148, QRS 98, QTc 447  Echo 02/26/19 demonstrated  1. The left ventricle has normal systolic function with an ejection fraction of 60-65%. The cavity size was normal. Left ventricular diastolic Doppler parameters are consistent with impaired relaxation.  2. The right ventricle has normal systolic function. The cavity was normal. There is no increase in right ventricular wall thickness.  3. Left atrial size was mildly dilated.  4. The aortic valve is tricuspid. Mild thickening of the aortic valve. Mild calcification of the aortic valve.  Epic records are reviewed at length today  Assessment and Plan:  1. Paroxysmal atrial fibrillation Patient appears to be maintaining SR. AAD options are limited with ESRD. Will decrease amiodarone to 100 mg daily. Check TSH, recent LFTs reviewed.  Continue warfarin  This patients CHA2DS2-VASc Score and unadjusted Ischemic Stroke Rate (% per year) is equal to 3.2 % stroke rate/year from a score of 3  Above score calculated as 1 point each if present [CHF, HTN, DM, Vascular=MI/PAD/Aortic  Plaque, Age if 65-74, or Male] Above score calculated as 2 points each if present [Age > 75, or Stroke/TIA/TE]   2. Obesity Body mass index is 37.31 kg/m. Lifestyle modification was discussed and encouraged including regular physical activity and weight reduction.  3. Obstructive sleep apnea The importance of adequate treatment of sleep apnea was discussed today in order to improve our ability to maintain sinus rhythm long term. Patient reports compliance with CPAP therapy.  4. ESRD on HD T/Th/Sat. S/p fistula 05/15/19. Followed by nephrology.  5. HTN Stable, no changes today.   Follow up in the AF clinic in 3 months.   [facility] [address] [place], [place] [address] [phone] 10/07/2019 9:46 AM

## 2019-10-07 NOTE — Patient Instructions (Signed)
Decrease Amiodarone 100mg  daily

## 2019-10-22 ENCOUNTER — Other Ambulatory Visit: Payer: Self-pay

## 2019-10-22 ENCOUNTER — Ambulatory Visit (INDEPENDENT_AMBULATORY_CARE_PROVIDER_SITE_OTHER): Payer: PRIVATE HEALTH INSURANCE | Admitting: *Deleted

## 2019-10-22 DIAGNOSIS — I4891 Unspecified atrial fibrillation: Secondary | ICD-10-CM | POA: Diagnosis not present

## 2019-10-22 DIAGNOSIS — Z7901 Long term (current) use of anticoagulants: Secondary | ICD-10-CM

## 2019-10-22 LAB — POCT INR: INR: 3.6 — AB (ref 2.0–3.0)

## 2019-10-22 NOTE — Patient Instructions (Signed)
11/27  Started Amiodarone 200mg  daily Hold warfarin tonight then resume 1.5 tablets daily except 1 tablet each Tuesday and Thursday.  Repeat INR in 3 weeks

## 2019-11-12 ENCOUNTER — Other Ambulatory Visit: Payer: Self-pay

## 2019-11-12 ENCOUNTER — Encounter (INDEPENDENT_AMBULATORY_CARE_PROVIDER_SITE_OTHER): Payer: Self-pay

## 2019-11-12 ENCOUNTER — Ambulatory Visit (INDEPENDENT_AMBULATORY_CARE_PROVIDER_SITE_OTHER): Payer: PRIVATE HEALTH INSURANCE | Admitting: Pharmacist

## 2019-11-12 DIAGNOSIS — I4891 Unspecified atrial fibrillation: Secondary | ICD-10-CM

## 2019-11-12 DIAGNOSIS — Z7901 Long term (current) use of anticoagulants: Secondary | ICD-10-CM | POA: Diagnosis not present

## 2019-11-12 LAB — POCT INR: INR: 5.1 — AB (ref 2.0–3.0)

## 2019-11-12 NOTE — Patient Instructions (Signed)
Hold warfarin tonight, tomorrow and Thursday, then decrease dose to 1 tablet (3mg ) daily except 1.5 (4.5mg )  tablet each Monday, Wed and Friday.  Repeat INR in 2 weeks

## 2019-11-14 ENCOUNTER — Telehealth: Payer: Self-pay | Admitting: Internal Medicine

## 2019-11-14 NOTE — Telephone Encounter (Signed)
Manufacturer recommendations for Eliquis would be 5 mg BID, even with ESRD, given his age <63 y/o and his weight is >60 kgs. I'm happy to talk to him about Eliquis if he would like. Will defer coumadin adjustments to coumadin clinic.

## 2019-11-14 NOTE — Telephone Encounter (Signed)
George Lewis dose was decreased yesterday.  INR's had been fairly stable until last 2 INR's.  Had been restarted on amiodarone but dose was decreased on 10/07/19.  Let me know what treatment you prefer and I will call wife. [name]

## 2019-11-14 NOTE — Telephone Encounter (Signed)
  Patient has been taking coumadin but his last two coumadin checks his INR has been up. She states the doctor at his dialysis center would like to know if the patient can take Eliquis 2.5mg  or does his coumadin need to be adjusted?

## 2019-11-14 NOTE — Telephone Encounter (Signed)
Called and discussed elevated INR levels the past 2 visits.  Pt had been restarted on amiodarone and had received his 2nd covid vaccine on 3/13 that possibly interfered with his INR as he had been stable previously.  Warfarin dose was decreased yesterday.  Informed wife we would like to continue warfarin for now and see if readings level off.  If not can always discuss Eliquis in more detail.  Wife and patient in agreement.  Has f/u INR appt on 11/26/19.

## 2019-11-26 ENCOUNTER — Encounter: Payer: PRIVATE HEALTH INSURANCE | Admitting: *Deleted

## 2019-11-26 ENCOUNTER — Other Ambulatory Visit: Payer: Self-pay

## 2019-11-26 ENCOUNTER — Ambulatory Visit (INDEPENDENT_AMBULATORY_CARE_PROVIDER_SITE_OTHER): Payer: PRIVATE HEALTH INSURANCE | Admitting: Pharmacist Clinician (PhC)/ Clinical Pharmacy Specialist

## 2019-11-26 DIAGNOSIS — Z7901 Long term (current) use of anticoagulants: Secondary | ICD-10-CM

## 2019-11-26 DIAGNOSIS — I4891 Unspecified atrial fibrillation: Secondary | ICD-10-CM

## 2019-11-26 LAB — POCT INR: INR: 2.2 (ref 2.0–3.0)

## 2019-11-26 NOTE — Patient Instructions (Signed)
Continue with 1 tablet (3mg ) daily except 1.5 (4.5mg )  tablet each Monday, Wed and Friday.  Repeat INR in 3 weeks

## 2019-12-17 ENCOUNTER — Ambulatory Visit (INDEPENDENT_AMBULATORY_CARE_PROVIDER_SITE_OTHER): Payer: PRIVATE HEALTH INSURANCE | Admitting: Pharmacist

## 2019-12-17 ENCOUNTER — Other Ambulatory Visit: Payer: Self-pay

## 2019-12-17 DIAGNOSIS — I4891 Unspecified atrial fibrillation: Secondary | ICD-10-CM | POA: Diagnosis not present

## 2019-12-17 DIAGNOSIS — Z7901 Long term (current) use of anticoagulants: Secondary | ICD-10-CM | POA: Diagnosis not present

## 2019-12-17 LAB — POCT INR: INR: 3.2 — AB (ref 2.0–3.0)

## 2019-12-17 NOTE — Patient Instructions (Signed)
Description   Skip Coumadin today, then continue with 1 tablet (3mg ) daily except 1.5 (4.5mg )  tablet each Monday, Wed and Friday.  Repeat INR in 3 weeks.

## 2019-12-19 ENCOUNTER — Other Ambulatory Visit: Payer: Self-pay | Admitting: Internal Medicine

## 2019-12-24 ENCOUNTER — Other Ambulatory Visit: Payer: Self-pay | Admitting: Internal Medicine

## 2019-12-24 NOTE — Telephone Encounter (Signed)
*  STAT* If patient is at the pharmacy, call can be transferred to refill team.   1. Which medications need to be refilled? (please list name of each medication and dose if known)  warfarin (COUMADIN) 3 MG tablet  2. Which pharmacy/location (including street and city if local pharmacy) is medication to be sent to? [place], [place]  3. Do they need a 30 day or 90 day supply? 90 day supply   Patient runs out of medication tonight.

## 2019-12-25 ENCOUNTER — Other Ambulatory Visit: Payer: Self-pay

## 2019-12-25 MED ORDER — WARFARIN SODIUM 3 MG PO TABS: ORAL_TABLET | ORAL | 0 refills | Status: DC

## 2019-12-25 NOTE — Telephone Encounter (Signed)
Refill sent by other means

## 2019-12-26 ENCOUNTER — Other Ambulatory Visit: Payer: Self-pay | Admitting: Physician Assistant

## 2019-12-26 ENCOUNTER — Other Ambulatory Visit: Payer: Self-pay

## 2019-12-26 ENCOUNTER — Telehealth: Payer: Self-pay | Admitting: Physician Assistant

## 2019-12-26 MED ORDER — GLUCOSE BLOOD VI STRP: ORAL_STRIP | 0 refills | Status: DC

## 2019-12-26 MED ORDER — ACCU-CHEK FASTCLIX LANCETS MISC: 0 refills | Status: DC

## 2019-12-26 MED ORDER — WARFARIN SODIUM 3 MG PO TABS: ORAL_TABLET | ORAL | 0 refills | Status: DC

## 2019-12-26 NOTE — Telephone Encounter (Signed)
Refill sent to pharmacy.  Please call patient to schedule apt per last office visit recommendations from 07/2019. AS, CMA

## 2019-12-26 NOTE — Telephone Encounter (Signed)
Katy's pt's wife called states he needs refill on Accu-Check glucose meter supplies :   Accu-Chek FastClix Lancets MISC [[id number]]   Order Details Dose: -- Route: -- Frequency: --  Dispense Quantity: -- Refills: --        &   Patient also needs ( Accu-chek test strips)   --Forwarding request to med asst to send refill order to :   [place], [place] [phone] (Phone) [phone] (Fax)   --Please contact patient if there are any questions or concerns @ [phone].  --glh

## 2019-12-26 NOTE — Telephone Encounter (Signed)
       Pt's last AVS states:  Return in about 3 months (around 11/26/2019) for Regular Follow Up, Diabetes, Hypercholestermia.

## 2020-01-02 NOTE — Progress Notes (Signed)
Primary Care Physician: No primary care provider on file. Primary Cardiologist: Dr Margaretann Loveless  Primary Electrophysiologist: none Referring Physician: Dr Christy Sartorius. is a 64 y.o. male with a history of right renal cell carcinomas/pnephrectomy in 9/19, ESRD on HD(TTS), HTN,HLD,SVT, DM type II, OSA on CPAP,morbid obesity, and paroxysmal atrial fibrillation who presents for follow up in the [facility]. The patient was initially diagnosed with atrial fibrillation 04/22/19 while he was hospitalized for diarrhea/food poisoning. Telemetry initially showed afib with RVR with rates up to 200 bpm. His electrolytes were within normal limits. Echo 02/26/19 showed normal EF 60-65% with mild LAE. Patient was started on amiodarone and coumadin for a CHADS2VASC score of 3. He does have OSA and is on CPAP. Patient did have one episode of afib after HD and significant hypotension which resolved quickly. He was placed back on amiodarone by Dr Margaretann Loveless.   On follow up today, patient reports that he has done well from an afib standpoint. He denies any heart racing or palpitations. He states that his heart is checked at every session of HD and he has been slow and regular. He denies any bleeding issues on anticoagulation. He does admit that he feels fatigued while doing yard work.  Today, he denies symptoms of palpitations, chest pain, shortness of breath, orthopnea, PND, lower extremity edema, dizziness, presyncope, syncope, snoring, daytime somnolence, bleeding, or neurologic sequela. The patient is tolerating medications without difficulties and is otherwise without complaint today.   Atrial Fibrillation Risk Factors:  he does have symptoms or diagnosis of sleep apnea. he is compliant with CPAP therapy.   he has a BMI of Body mass index is 37.21 kg/m.Marland Kitchen Filed Weights   01/03/20 1000  Weight: 127.9 kg    Family History  Problem Relation Age of Onset  .  Alzheimer's disease Mother   . Alzheimer's disease Father   . Heart disease Father   . Heart attack Father   . Cancer - Other Sister      Atrial Fibrillation Management history:  Previous antiarrhythmic drugs: amiodarone Previous cardioversions: none Previous ablations: none CHADS2VASC score: 3 Anticoagulation history: warfarin   Past Medical History:  Diagnosis Date  . Anemia   . Arthritis    knees  . Atrial fibrillation ([place])   . Bilateral renal cysts 03/23/2018   Noted MRI ABD  . Cancer [facility])    right kidney cancer  . Cancer [facility])    pancreatic  . Cholelithiasis 03/19/2018   noted on CT AB/Pelvis  . CKD (chronic kidney disease), stage IV [facility])    nephrologist-- dr Hollie Salk [name]);  05-01-2018 has not started dialysis, scheduled for AV fistula creation 05-07-2018  . Diabetes mellitus without complication ([place])   . Diverticulosis of colon 04/19/2018   Noted on CT abd/pelvis  . GERD (gastroesophageal reflux disease)   . Hepatic steatosis 03/19/2018   Mild diffuse, noted on CT AB/Pelvis  . History of kidney stones   . History of pulmonary embolus (PE) 03/2008   treated with coumadin for 6 months  . History of sepsis 04/20/2018   per discharge note , probable UTI  . Hypertension   . IBS (irritable bowel syndrome)   . Nocturia   . OSA on CPAP    uses CPAP nightly  . Pancreatic lesion    0.4cm cystic per CT 07/ 2019  . Peripheral vascular disease ([place])   . Pneumonia    x 1  . Pulmonary  nodule    Solid 73m Right Lower Lobe  . Right renal mass 04/10/2018   new dx--  scheduled for nephrectomy 05-16-2018  . S/P ureteral stent placement: 04/16/2018 04/19/2018   Past Surgical History:  Procedure Laterality Date  . AV FISTULA PLACEMENT Left 05/07/2018   Procedure: Creation of Left arm Radiocephalic Fistula;  Surgeon: CMarty Heck MD;  [place] [facility]  Service: Vascular;  Laterality: Left;  . AV FISTULA PLACEMENT Left 05/15/2019   Procedure: Creation  of Brachiocephalic fistula left arm;  Surgeon: CWaynetta Sandy MD;  [place] [facility]  Service: Vascular;  Laterality: Left;  . BASCILIC VEIN TRANSPOSITION Left 07/15/2019   Procedure: BASILIC VEIN TRANSPOSITION SECOND STAGE;  Surgeon: CWaynetta Sandy MD;  [place]  Service: Vascular;  Laterality: Left;  . COLONOSCOPY    . CYSTOSCOPY/RETROGRADE/URETEROSCOPY/STONE EXTRACTION WITH BASKET  x2 last one 1990s approx.  . CYSTOSCOPY/URETEROSCOPY/HOLMIUM LASER/STENT PLACEMENT Left 04/16/2018   Procedure: CYSTOSCOPY/LEFT URETEROSCOPY/LEFT RETROGRADE/STENT PLACEMENT;  Surgeon: BLucas Mallow MD;  [facility];  Service: Urology;  Laterality: Left;  . EUS N/A 05/03/2018   Procedure: UPPER ENDOSCOPIC ULTRASOUND (EUS) RADIAL;  Surgeon: JMilus Banister MD;  [facility];  Service: Gastroenterology;  Laterality: N/A;  . EUS N/A 05/03/2018   Procedure: UPPER ENDOSCOPIC ULTRASOUND (EUS) LINEAR;  Surgeon: JMilus Banister MD;  [facility];  Service: Gastroenterology;  Laterality: N/A;  . EXTRACORPOREAL SHOCK WAVE LITHOTRIPSY  x3  last one 2004 approx.  .Marland KitchenFINE NEEDLE ASPIRATION N/A 05/03/2018   Procedure: FINE NEEDLE ASPIRATION (FNA) LINEAR;  Surgeon: JMilus Banister MD;  [facility];  Service: Gastroenterology;  Laterality: N/A;  . LAPAROSCOPIC NEPHRECTOMY, HAND ASSISTED Right 05/16/2018   Procedure: HAND ASSISTED LAPAROSCOPIC RIGHT NEPHRECTOMY;  Surgeon: BLucas Mallow MD;  [facility];  Service: Urology;  Laterality: Right;  . left index finger attachment  1980s   3-4 surgeries  . TOE SURGERY  1980s   in beween 2nd and 3rd toes cyst removed  . UPPER GI ENDOSCOPY      Current Outpatient Medications  Medication Sig Dispense Refill  . Accu-Chek FastClix Lancets MISC Use to check fasting blood sugar and 2 hrs after largest meal. 100 each 0  . acetaminophen (TYLENOL) 500 MG tablet Take 1,000 mg by mouth as needed for mild pain or  headache.     . allopurinol (ZYLOPRIM) 100 MG tablet Take 1 tablet (100 mg total) by mouth every evening. 30 tablet 0  . amiodarone (PACERONE) 100 MG tablet Take 1 tablet (100 mg total) by mouth daily. 30 tablet 3  . aspirin EC 81 MG EC tablet Take 1 tablet (81 mg total) by mouth daily. (Patient taking differently: Take 81 mg by mouth every evening. ) 30 tablet 0  . atorvastatin (LIPITOR) 40 MG tablet Take 1 tablet (40 mg total) by mouth daily. (Patient taking differently: Take 40 mg by mouth every evening. ) 30 tablet 0  . AURYXIA 1 GM 210 MG(Fe) tablet Take 210 mg by mouth 3 (three) times daily.    . blood glucose meter kit and supplies Dispense based on patient and insurance preference. Use up to four times daily as directed. (FOR ICD-10 E10.9, E11.9). 1 each 0  . diphenhydramine-acetaminophen (TYLENOL PM) 25-500 MG TABS tablet Take 2 tablets by mouth at bedtime as needed (sleep).    . fluticasone (FLONASE) 50 MCG/ACT nasal spray Place 1-2 sprays into both nostrils as needed for allergies or rhinitis.     .Marland Kitchen  glucose blood test strip Use to check fasting blood sugar and 2 hrs after largest meal. 100 each 0  . iron sucrose in sodium chloride 0.9 % 100 mL Iron Sucrose (Venofer)    . Lidocaine-Prilocaine (EMLA EX) EMLA 2.5-2.5%    . loratadine (CLARITIN) 10 MG tablet Take 10 mg by mouth every evening.     . midodrine (PROAMATINE) 10 MG tablet Take 10 mg by mouth 2 (two) times daily at 8 am and 10 pm. Taking 30 mins prior to Dialysis    . nitroGLYCERIN (NITROSTAT) 0.4 MG SL tablet Place 1 tablet (0.4 mg total) under the tongue every 5 (five) minutes as needed for chest pain. 14 tablet 12  . NON FORMULARY Heparin Sodium (Porcine) 1,000 Units/mL Systemic    . omeprazole (PRILOSEC OTC) 20 MG tablet Take 40 mg by mouth daily before breakfast.     . tamsulosin (FLOMAX) 0.4 MG CAPS capsule Take 0.4 mg by mouth daily.    Marland Kitchen warfarin (COUMADIN) 3 MG tablet Take 1 to 1.5 tablets by mouth daily as directed by  coumadin clinic 135 tablet 0  . metoprolol tartrate (LOPRESSOR) 25 MG tablet Take 25 mg by mouth daily as needed (for elevated blood pressure (greater than 130)).     No current facility-administered medications for this encounter.    Allergies  Allergen Reactions  . Penicillins Rash and Other (See Comments)    Has patient had a PCN reaction causing immediate rash, facial/tongue/throat swelling, SOB or lightheadedness with hypotension: yes Has patient had a PCN reaction causing severe rash involving mucus membranes or skin necrosis: no Has patient had a PCN reaction that required hospitalization: no Has patient had a PCN reaction occurring within the last 10 years: no If all of the above answers are "NO", then may proceed with Cephalosporin use.     Social History   Socioeconomic History  . Marital status: Married    Spouse name: Not on file  . Number of children: Not on file  . Years of education: Not on file  . Highest education level: Not on file  Occupational History  . Occupation: disabled  Tobacco Use  . Smoking status: Never Smoker  . Smokeless tobacco: Never Used  Substance and Sexual Activity  . Alcohol use: Never  . Drug use: Never  . Sexual activity: Not on file  Other Topics Concern  . Not on file  Social History Narrative  . Not on file   Social Determinants of Health   Financial Resource Strain:   . Difficulty of Paying Living Expenses:   Food Insecurity:   . Worried About Charity fundraiser in the Last Year:   . Arboriculturist in the Last Year:   Transportation Needs:   . Film/video editor (Medical):   Marland Kitchen Lack of Transportation (Non-Medical):   Physical Activity:   . Days of Exercise per Week:   . Minutes of Exercise per Session:   Stress:   . Feeling of Stress :   Social Connections:   . Frequency of Communication with Friends and Family:   . Frequency of Social Gatherings with Friends and Family:   . Attends Religious Services:   .  Active Member of Clubs or Organizations:   . Attends Archivist Meetings:   Marland Kitchen Marital Status:   Intimate Partner Violence:   . Fear of Current or Ex-Partner:   . Emotionally Abused:   Marland Kitchen Physically Abused:   . Sexually Abused:  ROS- All systems are reviewed and negative except as per the HPI above.  Physical Exam: Vitals:   01/03/20 1000  BP: 104/62  Pulse: 85  Weight: 127.9 kg  Height: '6\' 1"'  (1.854 m)    GEN- The patient is well appearing obese male, alert and oriented x 3 today.   HEENT-head normocephalic, atraumatic, sclera clear, conjunctiva pink, hearing intact, trachea midline. Lungs- Clear to ausculation bilaterally, normal work of breathing Heart- Regular rate and rhythm, no murmurs, rubs or gallops  GI- soft, NT, ND, + BS Extremities- no clubbing, cyanosis, or edema MS- no significant deformity or atrophy Skin- no rash or lesion Psych- euthymic mood, full affect Neuro- strength and sensation are intact   Wt Readings from Last 3 Encounters:  01/03/20 127.9 kg  10/07/19 128.3 kg  09/13/19 124.3 kg    EKG today demonstrates SR HR 85, PR 166, QRS 90, QTc 442  Echo 02/26/19 demonstrated  1. The left ventricle has normal systolic function with an ejection fraction of 60-65%. The cavity size was normal. Left ventricular diastolic Doppler parameters are consistent with impaired relaxation.  2. The right ventricle has normal systolic function. The cavity was normal. There is no increase in right ventricular wall thickness.  3. Left atrial size was mildly dilated.  4. The aortic valve is tricuspid. Mild thickening of the aortic valve. Mild calcification of the aortic valve.  Epic records are reviewed at length today  Assessment and Plan:  1. Paroxysmal atrial fibrillation Patient appears to be maintaining SR. AAD options are limited with ESRD. Continue amiodarone to 100 mg daily. Check cmet today. Recent TSH reviewed.  Continue warfarin  This  patients CHA2DS2-VASc Score and unadjusted Ischemic Stroke Rate (% per year) is equal to 3.2 % stroke rate/year from a score of 3  Above score calculated as 1 point each if present [CHF, HTN, DM, Vascular=MI/PAD/Aortic Plaque, Age if 65-74, or Male] Above score calculated as 2 points each if present [Age > 75, or Stroke/TIA/TE]  2. Obesity Body mass index is 37.21 kg/m. Lifestyle modification was discussed and encouraged including regular physical activity and weight reduction.  3. Obstructive sleep apnea Patient reports compliance with CPAP therapy.  4. ESRD on HD T/Th/Sat. S/p fistula 05/15/19.  5. HTN Stable, no changes today.   Follow up with Dr Margaretann Loveless per recall. AF clinic as needed.    [facility] [address] [place], [place] [address] [phone] 01/03/2020 10:44 AM

## 2020-01-03 ENCOUNTER — Ambulatory Visit (HOSPITAL_COMMUNITY)
Admission: RE | Admit: 2020-01-03 | Discharge: 2020-01-03 | Disposition: A | Discharge status: Home / Self Care | Payer: PRIVATE HEALTH INSURANCE | Source: Ambulatory Visit | Attending: Physician Assistant | Admitting: Physician Assistant

## 2020-01-03 ENCOUNTER — Other Ambulatory Visit: Payer: Self-pay

## 2020-01-03 VITALS — BP 104/62 | HR 85 | Ht 73.0 in | Wt 282.0 lb

## 2020-01-03 DIAGNOSIS — E1122 Type 2 diabetes mellitus with diabetic chronic kidney disease: Secondary | ICD-10-CM | POA: Diagnosis not present

## 2020-01-03 DIAGNOSIS — Z8507 Personal history of malignant neoplasm of pancreas: Secondary | ICD-10-CM | POA: Insufficient documentation

## 2020-01-03 DIAGNOSIS — Z7982 Long term (current) use of aspirin: Secondary | ICD-10-CM | POA: Insufficient documentation

## 2020-01-03 DIAGNOSIS — Z86711 Personal history of pulmonary embolism: Secondary | ICD-10-CM | POA: Diagnosis not present

## 2020-01-03 DIAGNOSIS — K219 Gastro-esophageal reflux disease without esophagitis: Secondary | ICD-10-CM | POA: Diagnosis not present

## 2020-01-03 DIAGNOSIS — Z820 Family history of epilepsy and other diseases of the nervous system: Secondary | ICD-10-CM | POA: Insufficient documentation

## 2020-01-03 DIAGNOSIS — Z905 Acquired absence of kidney: Secondary | ICD-10-CM | POA: Insufficient documentation

## 2020-01-03 DIAGNOSIS — G4733 Obstructive sleep apnea (adult) (pediatric): Secondary | ICD-10-CM | POA: Insufficient documentation

## 2020-01-03 DIAGNOSIS — Z8249 Family history of ischemic heart disease and other diseases of the circulatory system: Secondary | ICD-10-CM | POA: Insufficient documentation

## 2020-01-03 DIAGNOSIS — I480 Paroxysmal atrial fibrillation: Secondary | ICD-10-CM

## 2020-01-03 DIAGNOSIS — Z7901 Long term (current) use of anticoagulants: Secondary | ICD-10-CM | POA: Insufficient documentation

## 2020-01-03 DIAGNOSIS — E1151 Type 2 diabetes mellitus with diabetic peripheral angiopathy without gangrene: Secondary | ICD-10-CM | POA: Diagnosis not present

## 2020-01-03 DIAGNOSIS — Z992 Dependence on renal dialysis: Secondary | ICD-10-CM | POA: Insufficient documentation

## 2020-01-03 DIAGNOSIS — Z85528 Personal history of other malignant neoplasm of kidney: Secondary | ICD-10-CM | POA: Diagnosis not present

## 2020-01-03 DIAGNOSIS — Z79899 Other long term (current) drug therapy: Secondary | ICD-10-CM | POA: Diagnosis not present

## 2020-01-03 DIAGNOSIS — Z880 Allergy status to penicillin: Secondary | ICD-10-CM | POA: Diagnosis not present

## 2020-01-03 DIAGNOSIS — N186 End stage renal disease: Secondary | ICD-10-CM | POA: Diagnosis not present

## 2020-01-03 DIAGNOSIS — E785 Hyperlipidemia, unspecified: Secondary | ICD-10-CM | POA: Diagnosis not present

## 2020-01-03 DIAGNOSIS — I120 Hypertensive chronic kidney disease with stage 5 chronic kidney disease or end stage renal disease: Secondary | ICD-10-CM | POA: Diagnosis not present

## 2020-01-03 DIAGNOSIS — Z87442 Personal history of urinary calculi: Secondary | ICD-10-CM | POA: Diagnosis not present

## 2020-01-03 DIAGNOSIS — I4891 Unspecified atrial fibrillation: Secondary | ICD-10-CM | POA: Diagnosis present

## 2020-01-03 DIAGNOSIS — Z6837 Body mass index (BMI) 37.0-37.9, adult: Secondary | ICD-10-CM | POA: Insufficient documentation

## 2020-01-03 DIAGNOSIS — D6869 Other thrombophilia: Secondary | ICD-10-CM

## 2020-01-03 LAB — COMPREHENSIVE METABOLIC PANEL
ALT: 17 U/L (ref 0–44)
AST: 22 U/L (ref 15–41)
Albumin: 3.8 g/dL (ref 3.5–5.0)
Alkaline Phosphatase: 71 U/L (ref 38–126)
Anion gap: 12 (ref 5–15)
BUN: 39 mg/dL — ABNORMAL HIGH (ref 8–23)
CO2: 30 mmol/L (ref 22–32)
Calcium: 9 mg/dL (ref 8.9–10.3)
Chloride: 98 mmol/L (ref 98–111)
Creatinine, Ser: 7.23 mg/dL — ABNORMAL HIGH (ref 0.61–1.24)
GFR calc Af Amer: 9 mL/min — ABNORMAL LOW (ref >60)
GFR calc non Af Amer: 7 mL/min — ABNORMAL LOW (ref >60)
Glucose, Bld: 104 mg/dL — ABNORMAL HIGH (ref 70–99)
Potassium: 5.7 mmol/L — ABNORMAL HIGH (ref 3.5–5.1)
Sodium: 140 mmol/L (ref 135–145)
Total Bilirubin: 1.1 mg/dL (ref 0.3–1.2)
Total Protein: 7.6 g/dL (ref 6.5–8.1)

## 2020-01-03 NOTE — Addendum Note (Signed)
Encounter addended by: Hinda Kehr, CMA on: 01/03/2020 11:54 AM  Actions taken: Order Reconciliation Section accessed

## 2020-01-07 ENCOUNTER — Ambulatory Visit (INDEPENDENT_AMBULATORY_CARE_PROVIDER_SITE_OTHER): Payer: PRIVATE HEALTH INSURANCE | Admitting: Pharmacist Clinician (PhC)/ Clinical Pharmacy Specialist

## 2020-01-07 ENCOUNTER — Other Ambulatory Visit: Payer: Self-pay

## 2020-01-07 DIAGNOSIS — I4891 Unspecified atrial fibrillation: Secondary | ICD-10-CM

## 2020-01-07 DIAGNOSIS — Z7901 Long term (current) use of anticoagulants: Secondary | ICD-10-CM

## 2020-01-07 LAB — POCT INR: INR: 2.8 (ref 2.0–3.0)

## 2020-01-07 NOTE — Patient Instructions (Signed)
continue with 1 tablet (3mg ) daily except 1.5 (4.5mg )  tablet each Monday, Wed and Friday.  Repeat INR in 4 weeks.

## 2020-01-08 ENCOUNTER — Telehealth: Payer: Self-pay | Admitting: Adult Health

## 2020-01-08 NOTE — Telephone Encounter (Signed)
Patient's wife called states he is asking for a Handicap plaque for his car.   --Forwarding request to Dorothea Ogle to print one from [facility] & forward to provider for processing.  --glh

## 2020-01-13 ENCOUNTER — Encounter: Payer: Self-pay | Admitting: Internal Medicine

## 2020-01-13 ENCOUNTER — Other Ambulatory Visit: Payer: Self-pay

## 2020-01-13 ENCOUNTER — Ambulatory Visit: Payer: PRIVATE HEALTH INSURANCE | Admitting: Internal Medicine

## 2020-01-13 VITALS — BP 120/70 | HR 100 | Ht 73.0 in | Wt 285.0 lb

## 2020-01-13 DIAGNOSIS — E782 Mixed hyperlipidemia: Secondary | ICD-10-CM | POA: Diagnosis not present

## 2020-01-13 DIAGNOSIS — Z794 Long term (current) use of insulin: Secondary | ICD-10-CM | POA: Diagnosis not present

## 2020-01-13 DIAGNOSIS — E669 Obesity, unspecified: Secondary | ICD-10-CM

## 2020-01-13 DIAGNOSIS — E1122 Type 2 diabetes mellitus with diabetic chronic kidney disease: Secondary | ICD-10-CM

## 2020-01-13 DIAGNOSIS — Z992 Dependence on renal dialysis: Secondary | ICD-10-CM | POA: Diagnosis not present

## 2020-01-13 DIAGNOSIS — N186 End stage renal disease: Secondary | ICD-10-CM

## 2020-01-13 LAB — POCT GLYCOSYLATED HEMOGLOBIN (HGB A1C): Hemoglobin A1C: 5.1 % (ref 4.0–5.6)

## 2020-01-13 NOTE — Patient Instructions (Addendum)
Please continue off diabetic medications.  You can check sugars 1x a day, rotating check times.  Please return in 6 months with your sugar log.

## 2020-01-13 NOTE — Progress Notes (Signed)
[name]., male   DOB: October 08, 1955, 64 y.o.   MRN: [id number]   This visit occurred during the SARS-CoV-2 public health emergency.  Safety protocols were in place, including screening questions prior to the visit, additional usage of staff PPE, and extensive cleaning of exam room while observing appropriate contact time as indicated for disinfecting solutions.   HPI: George Lewis. is a 64 y.o.-year-old male, referred by his PCP, [place], Berna Spare, NP, for management of DM2, dx in 11/2018, insulin-dependent, uncontrolled, without long-term complications.  Last visit 4 months ago.  Before last visit, he was diagnosed with a pancreatic mass seen on PET scan.  This was suspicious for an islet cell tumor.  He did not need surgery so far and he only needs follow-up.  Before last visit, he changed his diet and eliminated sweets and also started to exercise regularly.  He lost 30 pounds.  Since then, he gained 11 back.  Reviewed and addended history: He was dx with DM in 11/2018. He was admitted at that time with a glucose in the 900s, hydrated, and started on insulin.  He was discharged on insulin but his sugars improved and he even developed hypoglycemia.  His insulin doses were decreased  but he continued to have low blood sugars and to feel dizzy and tired and has blurry vision. At last visit, we ruled out type 1 diabetes: Component     Latest Ref Rng & Units 09/13/2019  Glucose, Plasma     65 - 99 mg/dL 118 (H)  C-Peptide     0.80 - 3.85 ng/mL 16.30 (H)  ZNT8 Antibodies     U/mL <15  Glutamic Acid Decarb Ab     <5 IU/mL <5  Islet Cell Ab     Neg:<1:1 Negative  After this, we stopped insulin completely.  Reviewed his HbA1c levels: Lab Results  Component Value Date   HGBA1C 5.1 08/28/2019   HGBA1C 5.1 05/28/2019   HGBA1C 7.4 (A) 02/11/2019   HGBA1C 14.9 (H) 12/19/2018   Pt is not on any medications for diabetes now.  He was previously on: - Metformin,  which was stopped due to CKD - Humalog and Lantus which were stopped due to good control  Pt checks his sugars 1-3 times a day: - am: 85-115 >> 77-102 >> 88-114, 123 - 2h after b'fast: n/c - before lunch: 68-96, 212 >> 69-105, 126 >> n/c - 2h after lunch: 52, 58, 76-135 >> n/c - before dinner: 59-133 >> 67-121, 131, 153 >> n/c - 2h after dinner: 67-144 >> n/c - bedtime: n/c >> 88-103 >> n/c >> 61, 68-113 - nighttime: n/c Lowest sugar was 52 >> 67 >> 61; he has hypoglycemia awareness in the 70s. Highest sugar was 926 >> 153 >> 126.  Glucometer: Accu-Chek Guide  Pt's meals are: - Breakfast: egg x1, [place] sausage or liver pudding, toast - Lunch: grilled chicken + salad, pinneapple - Dinner: grilled chicken or fish, hamburger, salad or veggies - Snacks, fruit   -+ ESRD, s/p R nephrectomy 2019 for RCC - sees Dr. Hollie Salk -now on HD TTS stent 02/2019- last BUN/creatinine:  Lab Results  Component Value Date   BUN 39 (H) 01/03/2020   BUN 54 (H) 07/15/2019   CREATININE 7.23 (H) 01/03/2020   CREATININE 4.70 (H) 07/15/2019   -+ HL; last set of lipids: Lab Results  Component Value Date   CHOL 141 08/28/2019   HDL 29 (L) 08/28/2019   [place]  83 08/28/2019   TRIG 165 (H) 08/28/2019   CHOLHDL 4.9 08/28/2019   - last eye exam was in 02/2019: No DR  -He denies numbness and tingling in his feet.  Pt has no FH of DM.  He also has hepatic steatosis.  ROS: Constitutional: no weight gain/no weight loss, no fatigue, no subjective hyperthermia, no subjective hypothermia Eyes: no blurry vision, no xerophthalmia ENT: no sore throat, no nodules palpated in neck, no dysphagia, no odynophagia, no hoarseness Cardiovascular: no CP/no SOB/no palpitations/no leg swelling Respiratory: no cough/no SOB/no wheezing Gastrointestinal: no N/no V/no D/no C/no acid reflux Musculoskeletal: no muscle aches/no joint aches Skin: no rashes, no hair loss Neurological: no tremors/no numbness/no tingling/no  dizziness  I reviewed pt's medications, allergies, PMH, social hx, family hx, and changes were documented in the history of present illness. Otherwise, unchanged from my initial visit note.  Past Medical History:  Diagnosis Date  . Anemia   . Arthritis    knees  . Atrial fibrillation ([place])   . Bilateral renal cysts 03/23/2018   Noted MRI ABD  . Cancer [facility])    right kidney cancer  . Cancer [facility])    pancreatic  . Cholelithiasis 03/19/2018   noted on CT AB/Pelvis  . CKD (chronic kidney disease), stage IV [facility])    nephrologist-- dr Hollie Salk [name]);  05-01-2018 has not started dialysis, scheduled for AV fistula creation 05-07-2018  . Diabetes mellitus without complication ([place])   . Diverticulosis of colon 04/19/2018   Noted on CT abd/pelvis  . GERD (gastroesophageal reflux disease)   . Hepatic steatosis 03/19/2018   Mild diffuse, noted on CT AB/Pelvis  . History of kidney stones   . History of pulmonary embolus (PE) 03/2008   treated with coumadin for 6 months  . History of sepsis 04/20/2018   per discharge note , probable UTI  . Hypertension   . IBS (irritable bowel syndrome)   . Nocturia   . OSA on CPAP    uses CPAP nightly  . Pancreatic lesion    0.4cm cystic per CT 07/ 2019  . Peripheral vascular disease ([place])   . Pneumonia    x 1  . Pulmonary nodule    Solid 44m Right Lower Lobe  . Right renal mass 04/10/2018   new dx--  scheduled for nephrectomy 05-16-2018  . S/P ureteral stent placement: 04/16/2018 04/19/2018   Past Surgical History:  Procedure Laterality Date  . AV FISTULA PLACEMENT Left 05/07/2018   Procedure: Creation of Left arm Radiocephalic Fistula;  Surgeon: CMarty Heck MD;  [place] [facility]  Service: Vascular;  Laterality: Left;  . AV FISTULA PLACEMENT Left 05/15/2019   Procedure: Creation of Brachiocephalic fistula left arm;  Surgeon: CWaynetta Sandy MD;  [place] [facility]  Service: Vascular;  Laterality: Left;  . BASCILIC VEIN  TRANSPOSITION Left 07/15/2019   Procedure: BASILIC VEIN TRANSPOSITION SECOND STAGE;  Surgeon: CWaynetta Sandy MD;  [place] [facility]  Service: Vascular;  Laterality: Left;  . COLONOSCOPY    . CYSTOSCOPY/RETROGRADE/URETEROSCOPY/STONE EXTRACTION WITH BASKET  x2 last one 1990s approx.  . CYSTOSCOPY/URETEROSCOPY/HOLMIUM LASER/STENT PLACEMENT Left 04/16/2018   Procedure: CYSTOSCOPY/LEFT URETEROSCOPY/LEFT RETROGRADE/STENT PLACEMENT;  Surgeon: BLucas Mallow MD;  Location: [facility];  Service: Urology;  Laterality: Left;  . EUS N/A 05/03/2018   Procedure: UPPER ENDOSCOPIC ULTRASOUND (EUS) RADIAL;  Surgeon: JMilus Banister MD;  [facility];  Service: Gastroenterology;  Laterality: N/A;  . EUS N/A 05/03/2018   Procedure: UPPER ENDOSCOPIC  ULTRASOUND (EUS) LINEAR;  Surgeon: Milus Banister, MD;  [facility];  Service: Gastroenterology;  Laterality: N/A;  . EXTRACORPOREAL SHOCK WAVE LITHOTRIPSY  x3  last one 2004 approx.  Marland Kitchen FINE NEEDLE ASPIRATION N/A 05/03/2018   Procedure: FINE NEEDLE ASPIRATION (FNA) LINEAR;  Surgeon: Milus Banister, MD;  [facility]: [facility];  Service: Gastroenterology;  Laterality: N/A;  . LAPAROSCOPIC NEPHRECTOMY, HAND ASSISTED Right 05/16/2018   Procedure: HAND ASSISTED LAPAROSCOPIC RIGHT NEPHRECTOMY;  Surgeon: Lucas Mallow, MD;  Location: [facility];  Service: Urology;  Laterality: Right;  . left index finger attachment  1980s   3-4 surgeries  . TOE SURGERY  1980s   in beween 2nd and 3rd toes cyst removed  . UPPER GI ENDOSCOPY     Social History   Socioeconomic History  . Marital status: Married    Spouse name: Not on file  . Number of children: Not on file  . Years of education: Not on file  . Highest education level: Not on file  Occupational History  . Occupation: disabled  Tobacco Use  . Smoking status: Never Smoker  . Smokeless tobacco: Never Used  Substance and Sexual Activity  . Alcohol use: Never  . Drug use: Never  . Sexual  activity: Not on file  Other Topics Concern  . Not on file  Social History Narrative  . Not on file   Social Determinants of Health   Financial Resource Strain:   . Difficulty of Paying Living Expenses:   Food Insecurity:   . Worried About Charity fundraiser in the Last Year:   . Arboriculturist in the Last Year:   Transportation Needs:   . Film/video editor (Medical):   Marland Kitchen Lack of Transportation (Non-Medical):   Physical Activity:   . Days of Exercise per Week:   . Minutes of Exercise per Session:   Stress:   . Feeling of Stress :   Social Connections:   . Frequency of Communication with Friends and Family:   . Frequency of Social Gatherings with Friends and Family:   . Attends Religious Services:   . Active Member of Clubs or Organizations:   . Attends Archivist Meetings:   Marland Kitchen Marital Status:   Intimate Partner Violence:   . Fear of Current or Ex-Partner:   . Emotionally Abused:   Marland Kitchen Physically Abused:   . Sexually Abused:    Medications: Current Outpatient Medications on File Prior to Visit  Medication Sig Dispense Refill  . Accu-Chek FastClix Lancets MISC Use to check fasting blood sugar and 2 hrs after largest meal. 100 each 0  . acetaminophen (TYLENOL) 500 MG tablet Take 1,000 mg by mouth as needed for mild pain or headache.     . allopurinol (ZYLOPRIM) 100 MG tablet Take 1 tablet (100 mg total) by mouth every evening. 30 tablet 0  . amiodarone (PACERONE) 100 MG tablet Take 1 tablet (100 mg total) by mouth daily. 30 tablet 3  . aspirin EC 81 MG EC tablet Take 1 tablet (81 mg total) by mouth daily. (Patient taking differently: Take 81 mg by mouth every evening. ) 30 tablet 0  . atorvastatin (LIPITOR) 40 MG tablet Take 1 tablet (40 mg total) by mouth daily. (Patient taking differently: Take 40 mg by mouth every evening. ) 30 tablet 0  . AURYXIA 1 GM 210 MG(Fe) tablet Take 210 mg by mouth 3 (three) times daily.    . blood glucose meter kit  and supplies  Dispense based on patient and insurance preference. Use up to four times daily as directed. (FOR ICD-10 E10.9, E11.9). 1 each 0  . diphenhydramine-acetaminophen (TYLENOL PM) 25-500 MG TABS tablet Take 2 tablets by mouth at bedtime as needed (sleep).    . fluticasone (FLONASE) 50 MCG/ACT nasal spray Place 1-2 sprays into both nostrils as needed for allergies or rhinitis.     Marland Kitchen glucose blood test strip Use to check fasting blood sugar and 2 hrs after largest meal. 100 each 0  . iron sucrose in sodium chloride 0.9 % 100 mL Iron Sucrose (Venofer)    . Lidocaine-Prilocaine (EMLA EX) EMLA 2.5-2.5%    . loratadine (CLARITIN) 10 MG tablet Take 10 mg by mouth every evening.     . midodrine (PROAMATINE) 10 MG tablet Take 10 mg by mouth 2 (two) times daily at 8 am and 10 pm. Taking 30 mins prior to Dialysis    . nitroGLYCERIN (NITROSTAT) 0.4 MG SL tablet Place 1 tablet (0.4 mg total) under the tongue every 5 (five) minutes as needed for chest pain. 14 tablet 12  . NON FORMULARY Heparin Sodium (Porcine) 1,000 Units/mL Systemic    . omeprazole (PRILOSEC OTC) 20 MG tablet Take 40 mg by mouth daily before breakfast.     . tamsulosin (FLOMAX) 0.4 MG CAPS capsule Take 0.4 mg by mouth daily.    Marland Kitchen warfarin (COUMADIN) 3 MG tablet Take 1 to 1.5 tablets by mouth daily as directed by coumadin clinic 135 tablet 0   No current facility-administered medications on file prior to visit.   Allergies  Allergen Reactions  . Penicillins Rash and Other (See Comments)    Has patient had a PCN reaction causing immediate rash, facial/tongue/throat swelling, SOB or lightheadedness with hypotension: yes Has patient had a PCN reaction causing severe rash involving mucus membranes or skin necrosis: no Has patient had a PCN reaction that required hospitalization: no Has patient had a PCN reaction occurring within the last 10 years: no If all of the above answers are "NO", then may proceed with Cephalosporin use.    Family History   Problem Relation Age of Onset  . Alzheimer's disease Mother   . Alzheimer's disease Father   . Heart disease Father   . Heart attack Father   . Cancer - Other Sister    PE: BP 120/70   Pulse 100   Ht '6\' 1"'$  (1.854 m)   Wt 285 lb (129.3 kg)   SpO2 99%   BMI 37.60 kg/m  Wt Readings from Last 3 Encounters:  01/13/20 285 lb (129.3 kg)  01/03/20 282 lb (127.9 kg)  10/07/19 282 lb 12.8 oz (128.3 kg)   Constitutional: overweight, in NAD Eyes: PERRLA, EOMI, no exophthalmos ENT: moist mucous membranes, no thyromegaly, no cervical lymphadenopathy Cardiovascular: tachycardia, RR, No MRG Respiratory: CTA B Gastrointestinal: abdomen soft, NT, ND, BS+ Musculoskeletal: no deformities, strength intact in all 4 Skin: moist, warm, no rashes Neurological: no tremor with outstretched hands, DTR normal in all 4  ASSESSMENT: 1. DM2, insulin-dependent, uncontrolled, without long-term complications He does not have a history of pancreatitis or family history of medullary thyroid cancer  2. HL  3. Obesity class 2  PLAN:  1. Patient with recent onset diabetes, previously on Metformin, and basal-bolus insulin regimen, now off all of the diabetic medications due to improvement of his blood sugars after improving his diet and starting exercise.  At last visit he was having low blood sugars and, after  we ruled out type 1 diabetes, I advised him to stop Lantus completely.  We are limited in his diabetes management by his end-stage renal disease.  In the future, if we need intensification of treatment, we can consider a GLP-1 receptor agonist. -At this visit, which sugars remain very well controlled without any diabetic medications.  He was able to stop insulin right after our last visit.  As of now, he does not need any diabetic medications.  I advised him to continue to check his sugars but alternate the check times between different times of the day. - I suggested to:  Patient Instructions  Please  continue off diabetic medications.  Please return in 6 months with your sugar log.   - we checked his HbA1c: 5.1% (stable) - advised to check sugars at different times of the day - 1x a day, rotating check times - advised for yearly eye exams >> he is UTD - return to clinic in 6 months  2. HL -Reviewed latest lipid panel from 07/2019: LDL slightly above goal, triglycerides slightly high, HDL low: Lab Results  Component Value Date   CHOL 141 08/28/2019   HDL 29 (L) 08/28/2019   LDLCALC 83 08/28/2019   TRIG 165 (H) 08/28/2019   CHOLHDL 4.9 08/28/2019  -Continues Lipitor 40 without side effects.  3. Obesity class 2 -After changing his diet and starting exercise, he lost 30 pounds before last visit.  He gained 11 pounds since then.  His dry weight was recently adjusted up. -We stopped insulin after last visit, and this should also help with weight loss  Philemon Kingdom, MD PhD [facility]

## 2020-01-13 NOTE — Addendum Note (Signed)
Addended by: Cardell Peach I on: 01/13/2020 11:04 AM   Modules accepted: Orders

## 2020-02-04 ENCOUNTER — Telehealth: Payer: Self-pay | Admitting: Internal Medicine

## 2020-02-04 NOTE — Telephone Encounter (Signed)
Dose clarified:  Patient to continue current regimen of warfarin 3mg  daily except for 4.5mg  every Monday, Wednesday and Friday.   Next INR on 6/15 at 11:30am. Appt confirmed as well.

## 2020-02-04 NOTE — Telephone Encounter (Signed)
New Message:     Pt thought he had a Coumadin appt there today, but it is not until next week. He needs to know how to take his Coumadin today please.

## 2020-02-11 ENCOUNTER — Other Ambulatory Visit: Payer: Self-pay

## 2020-02-11 ENCOUNTER — Ambulatory Visit (INDEPENDENT_AMBULATORY_CARE_PROVIDER_SITE_OTHER): Payer: PRIVATE HEALTH INSURANCE | Admitting: Pharmacist

## 2020-02-11 DIAGNOSIS — Z7901 Long term (current) use of anticoagulants: Secondary | ICD-10-CM | POA: Diagnosis not present

## 2020-02-11 DIAGNOSIS — I4891 Unspecified atrial fibrillation: Secondary | ICD-10-CM | POA: Diagnosis not present

## 2020-02-11 LAB — POCT INR: INR: 3.6 — AB (ref 2.0–3.0)

## 2020-02-11 NOTE — Patient Instructions (Signed)
Description   Hold dose of warfarin tonight.  Reduce dose on Wednesday to 3 mg.  Then continue with 1 tablet (3mg ) daily except 1.5 (4.5mg )  tablet each Monday and Friday.  Repeat INR in 2 weeks.

## 2020-02-11 NOTE — Progress Notes (Signed)
Established Patient Office Visit  Subjective:  Patient ID: George Boursiquot., male    DOB: 1956-02-14  Age: 64 y.o. MRN: [id number]  CC:  Chief Complaint  Patient presents with  . Diabetes  . Hypertension  . Hyperlipidemia    HPI George Lewis. presents for chronic follow-up on diabetes mellitus, hypertension, and hyperlipidemia.  Diabetes: Pt followed by endocrinology. Pt is controlling diabetes with diet. No hypoglycemic events. Checking glucose at home.  Brings his glucose log and FBS range from 90-120s.  HTN: Pt denies chest pain, palpitations, or lower extremity swelling. Taking medication as directed without side effects when needed. Checks BP at home and brings blood pressure log, readings range 100-113/55-68. Pt follows a low salt diet. Reports good hydration.  HLD: Pt taking medication as directed without issues. Denies side effects including myalgias and RUQ pain.  Atrial fibrillation: Followed by cardiology.  On warfarin so goes to the Coumadin clinic on a weekly basis. Denies bleeding issues.  ESRD on HD: Followed by Nephrology, goes to [facility] in [place] for HD. Pt has noticed urinary retention and urgency which he knows can be related to HD but wants to make sure is not a UTI. He also has low back pain. Denies dysuria, fever, or chills.   Dizziness: Reports feeling dizzy for 1 to 2 weeks especially when going from one position to another.  Denies nausea, vomiting, altered mental status, hearing loss, or headache.    Past Medical History:  Diagnosis Date  . Anemia   . Arthritis    knees  . Atrial fibrillation ([place])   . Bilateral renal cysts 03/23/2018   Noted MRI ABD  . Cancer [facility])    right kidney cancer  . Cancer [facility])    pancreatic  . Cholelithiasis 03/19/2018   noted on CT AB/Pelvis  . CKD (chronic kidney disease), stage IV [facility])    nephrologist-- dr Hollie Salk [name]);  05-01-2018 has not started dialysis, scheduled for AV fistula creation  05-07-2018  . Diabetes mellitus without complication ([place])   . Diverticulosis of colon 04/19/2018   Noted on CT abd/pelvis  . GERD (gastroesophageal reflux disease)   . Hepatic steatosis 03/19/2018   Mild diffuse, noted on CT AB/Pelvis  . History of kidney stones   . History of pulmonary embolus (PE) 03/2008   treated with coumadin for 6 months  . History of sepsis 04/20/2018   per discharge note , probable UTI  . Hypertension   . IBS (irritable bowel syndrome)   . Nocturia   . OSA on CPAP    uses CPAP nightly  . Pancreatic lesion    0.4cm cystic per CT 07/ 2019  . Peripheral vascular disease ([place])   . Pneumonia    x 1  . Pulmonary nodule    Solid 37m Right Lower Lobe  . Right renal mass 04/10/2018   new dx--  scheduled for nephrectomy 05-16-2018  . S/P ureteral stent placement: 04/16/2018 04/19/2018    Past Surgical History:  Procedure Laterality Date  . AV FISTULA PLACEMENT Left 05/07/2018   Procedure: Creation of Left arm Radiocephalic Fistula;  Surgeon: CMarty Heck MD;  [place]  Service: Vascular;  Laterality: Left;  . AV FISTULA PLACEMENT Left 05/15/2019   Procedure: Creation of Brachiocephalic fistula left arm;  Surgeon: CWaynetta Sandy MD;  [place]  Service: Vascular;  Laterality: Left;  . BASCILIC VEIN TRANSPOSITION Left 07/15/2019   Procedure: BASILIC VEIN TRANSPOSITION SECOND STAGE;  Surgeon: Waynetta Sandy, MD;  Location: [place];  Service: Vascular;  Laterality: Left;  . COLONOSCOPY    . CYSTOSCOPY/RETROGRADE/URETEROSCOPY/STONE EXTRACTION WITH BASKET  x2 last one 1990s approx.  . CYSTOSCOPY/URETEROSCOPY/HOLMIUM LASER/STENT PLACEMENT Left 04/16/2018   Procedure: CYSTOSCOPY/LEFT URETEROSCOPY/LEFT RETROGRADE/STENT PLACEMENT;  Surgeon: Lucas Mallow, MD;  Location: [facility];  Service: Urology;  Laterality: Left;  . EUS N/A 05/03/2018   Procedure: UPPER ENDOSCOPIC ULTRASOUND (EUS) RADIAL;  Surgeon: Milus Banister, MD;   [facility];  Service: Gastroenterology;  Laterality: N/A;  . EUS N/A 05/03/2018   Procedure: UPPER ENDOSCOPIC ULTRASOUND (EUS) LINEAR;  Surgeon: Milus Banister, MD;  [facility];  Service: Gastroenterology;  Laterality: N/A;  . EXTRACORPOREAL SHOCK WAVE LITHOTRIPSY  x3  last one 2004 approx.  Marland Kitchen FINE NEEDLE ASPIRATION N/A 05/03/2018   Procedure: FINE NEEDLE ASPIRATION (FNA) LINEAR;  Surgeon: Milus Banister, MD;  [facility];  Service: Gastroenterology;  Laterality: N/A;  . LAPAROSCOPIC NEPHRECTOMY, HAND ASSISTED Right 05/16/2018   Procedure: HAND ASSISTED LAPAROSCOPIC RIGHT NEPHRECTOMY;  Surgeon: Lucas Mallow, MD;  Location[facility];  Service: Urology;  Laterality: Right;  . left index finger attachment  1980s   3-4 surgeries  . TOE SURGERY  1980s   in beween 2nd and 3rd toes cyst removed  . UPPER GI ENDOSCOPY      Family History  Problem Relation Age of Onset  . Alzheimer's disease Mother   . Alzheimer's disease Father   . Heart disease Father   . Heart attack Father   . Cancer - Other Sister     Social History   Socioeconomic History  . Marital status: Married    Spouse name: Not on file  . Number of children: Not on file  . Years of education: Not on file  . Highest education level: Not on file  Occupational History  . Occupation: disabled  Tobacco Use  . Smoking status: Never Smoker  . Smokeless tobacco: Never Used  Vaping Use  . Vaping Use: Never used  Substance and Sexual Activity  . Alcohol use: Never  . Drug use: Never  . Sexual activity: Not on file  Other Topics Concern  . Not on file  Social History Narrative  . Not on file   Social Determinants of Health   Financial Resource Strain:   . Difficulty of Paying Living Expenses:   Food Insecurity:   . Worried About Charity fundraiser in the Last Year:   . Arboriculturist in the Last Year:   Transportation Needs:   . Film/video editor (Medical):   Marland Kitchen Lack of  Transportation (Non-Medical):   Physical Activity:   . Days of Exercise per Week:   . Minutes of Exercise per Session:   Stress:   . Feeling of Stress :   Social Connections:   . Frequency of Communication with Friends and Family:   . Frequency of Social Gatherings with Friends and Family:   . Attends Religious Services:   . Active Member of Clubs or Organizations:   . Attends Archivist Meetings:   Marland Kitchen Marital Status:   Intimate Partner Violence:   . Fear of Current or Ex-Partner:   . Emotionally Abused:   Marland Kitchen Physically Abused:   . Sexually Abused:     Outpatient Medications Prior to Visit  Medication Sig Dispense Refill  . Accu-Chek FastClix Lancets MISC Use to check fasting blood sugar and 2 hrs after  largest meal. 100 each 0  . acetaminophen (TYLENOL) 500 MG tablet Take 1,000 mg by mouth as needed for mild pain or headache.     . allopurinol (ZYLOPRIM) 100 MG tablet Take 1 tablet (100 mg total) by mouth every evening. 30 tablet 0  . amiodarone (PACERONE) 100 MG tablet Take 1 tablet (100 mg total) by mouth daily. 30 tablet 3  . aspirin EC 81 MG EC tablet Take 1 tablet (81 mg total) by mouth daily. (Patient taking differently: Take 81 mg by mouth every evening. ) 30 tablet 0  . atorvastatin (LIPITOR) 40 MG tablet Take 1 tablet (40 mg total) by mouth daily. (Patient taking differently: Take 40 mg by mouth every evening. ) 30 tablet 0  . AURYXIA 1 GM 210 MG(Fe) tablet Take 210 mg by mouth 3 (three) times daily.    . blood glucose meter kit and supplies Dispense based on patient and insurance preference. Use up to four times daily as directed. (FOR ICD-10 E10.9, E11.9). 1 each 0  . diphenhydramine-acetaminophen (TYLENOL PM) 25-500 MG TABS tablet Take 2 tablets by mouth at bedtime as needed (sleep).    Marland Kitchen glucose blood test strip Use to check fasting blood sugar and 2 hrs after largest meal. 100 each 0  . iron sucrose in sodium chloride 0.9 % 100 mL Iron Sucrose (Venofer)    .  Lidocaine-Prilocaine (EMLA EX) EMLA 2.5-2.5%    . loratadine (CLARITIN) 10 MG tablet Take 10 mg by mouth every evening.     . midodrine (PROAMATINE) 10 MG tablet Take 10 mg by mouth 2 (two) times daily at 8 am and 10 pm. Taking 30 mins prior to Dialysis    . nitroGLYCERIN (NITROSTAT) 0.4 MG SL tablet Place 1 tablet (0.4 mg total) under the tongue every 5 (five) minutes as needed for chest pain. 14 tablet 12  . omeprazole (PRILOSEC OTC) 20 MG tablet Take 40 mg by mouth daily before breakfast.     . tamsulosin (FLOMAX) 0.4 MG CAPS capsule Take 0.4 mg by mouth daily.    Marland Kitchen warfarin (COUMADIN) 3 MG tablet Take 1 to 1.5 tablets by mouth daily as directed by coumadin clinic 135 tablet 0  . fluticasone (FLONASE) 50 MCG/ACT nasal spray Place 1-2 sprays into both nostrils as needed for allergies or rhinitis.  (Patient not taking: Reported on 02/12/2020)    . NON FORMULARY Heparin Sodium (Porcine) 1,000 Units/mL Systemic (Patient not taking: Reported on 02/12/2020)     No facility-administered medications prior to visit.    Allergies  Allergen Reactions  . Penicillins Rash and Other (See Comments)    Has patient had a PCN reaction causing immediate rash, facial/tongue/throat swelling, SOB or lightheadedness with hypotension: yes Has patient had a PCN reaction causing severe rash involving mucus membranes or skin necrosis: no Has patient had a PCN reaction that required hospitalization: no Has patient had a PCN reaction occurring within the last 10 years: no If all of the above answers are "NO", then may proceed with Cephalosporin use.     ROS Review of Systems Review of Systems:  A fourteen system review of systems was performed and found to be positive as per HPI.    Objective:    Physical Exam General:  Well Developed, well nourished, appropriate for stated age.  Neuro:  Alert and oriented,  extra-ocular muscles intact. Positive Dix Hallpike maneuver.  HEENT:  Normocephalic, atraumatic, neck  supple. B/L ear fluid behind TM without erythema or drainage noted.  Skin:  no gross rash, warm, pink. Cardiac:  RRR, S1 S2 Respiratory:  ECTA B/L and A/P, Not using accessory muscles, speaking in full sentences- unlabored. MSK: No CVA tenderness. Tenderness to palpation of mid-back and SI joints. Vascular:  Ext warm, no cyanosis apprec.; cap RF less 2 sec. Psych:  No HI/SI, judgement and insight good, Euthymic mood. Full Affect.   BP 122/69   Pulse 98   Temp 97.9 F (36.6 C) (Oral)   Ht _0  (1.854 m)   Wt 280 lb 6.4 oz (127.2 kg)   SpO2 99%   BMI 36.99 kg/m  Wt Readings from Last 3 Encounters:  02/12/20 280 lb 6.4 oz (127.2 kg)  01/13/20 285 lb (129.3 kg)  01/03/20 282 lb (127.9 kg)     Health Maintenance Due  Topic Date Due  . Hepatitis C Screening  Never done  . PNEUMOCOCCAL POLYSACCHARIDE VACCINE AGE 51-64 HIGH RISK  Never done  . TETANUS/TDAP  Never done  . COLONOSCOPY  Never done    There are no preventive care reminders to display for this patient.  Lab Results  Component Value Date   TSH 1.659 10/07/2019   Lab Results  Component Value Date   WBC 7.6 04/23/2019   HGB 13.6 07/15/2019   HCT 40.0 07/15/2019   MCV 82.1 04/23/2019   PLT 209 04/23/2019   Lab Results  Component Value Date   NA 140 01/03/2020   K 5.7 (H) 01/03/2020   CO2 30 01/03/2020   GLUCOSE 104 (H) 01/03/2020   BUN 39 (H) 01/03/2020   CREATININE 7.23 (H) 01/03/2020   BILITOT 1.1 01/03/2020   ALKPHOS 71 01/03/2020   AST 22 01/03/2020   ALT 17 01/03/2020   PROT 7.6 01/03/2020   ALBUMIN 3.8 01/03/2020   CALCIUM 9.0 01/03/2020   ANIONGAP 12 01/03/2020   Lab Results  Component Value Date   CHOL 141 08/28/2019   Lab Results  Component Value Date   HDL 29 (L) 08/28/2019   Lab Results  Component Value Date   LDLCALC 83 08/28/2019   Lab Results  Component Value Date   TRIG 165 (H) 08/28/2019   Lab Results  Component Value Date   CHOLHDL 4.9 08/28/2019   Lab Results   Component Value Date   HGBA1C 5.1 01/13/2020      Assessment & Plan:   Problem List Items Addressed This Visit      Cardiovascular and Mediastinum   HTN (hypertension)   Paroxysmal atrial fibrillation (HCC)     Endocrine   Type 2 diabetes mellitus with chronic kidney disease on chronic dialysis, with long-term current use of insulin (HCC) - Primary     Genitourinary   UTI (urinary tract infection): Probable   Relevant Medications   ciprofloxacin (CIPRO) 500 MG tablet   ESRD on hemodialysis ([place])    Other Visit Diagnoses    Hyperlipidemia associated with type 2 diabetes mellitus ([place])       Urinary urgency       Relevant Orders   POCT urinalysis dipstick (Completed)   Urine Culture (Completed)   Urinary retention       Relevant Orders   POCT urinalysis dipstick (Completed)   Urine Culture (Completed)   Low back pain, unspecified back pain laterality, unspecified chronicity, unspecified whether sciatica present       Relevant Orders   POCT urinalysis dipstick (Completed)   Urine Culture (Completed)   Benign paroxysmal positional vertigo, unspecified laterality  Fluid level behind tympanic membrane of both ears         Type 2 Diabetes Mellitus: - Most recent A1c 5.1, stable. - Continue to follow low carbohydrate and glucose with diet. - Continue ambulatory glucose monitoring and keep a log. - Continue to follow-up with endocrinology as instructed.  HTN: - BP today is 122/69, at goal. - Continue ambulatory BP and pulse monitoring and keep a log. - Continue DASH diet. - Stay well hydrated, at least 64 fl oz  HLD: - Last lipid panel: Triglycerides elevated, HDL low, LDL at 83 - Continue atorvastatin 40 mg. - Follow heart healthy diet and stay as active as possible.   PAF: -Followed by cardiology/atrial fibrillation clinic. -Continue amiodarone and warfarin.  ESRD: -Continue hemodialysis sessions. -Most recent CMP: Creatinine 7.23, GFR 7  Urinary  urgency, urinary retention, UTI, LBP: -Patient afebrile and stable, no CVA tenderness on exam. -Urine sample obtained for dipstick urinalysis which revealed hematuria, pyuria, trace of ketones, and negative nitrites. -Will send for urine culture, pending results will send appropriate antibiotic if indicated. -If symptoms worsen notify the clinic and will start empiric antibiotic if urine culture results still pending.  BPPV: -Nystagmus noted and patient became dizzy with Dix-Hallpike maneuver indicating BPPV. -Provided at home exercises for Epley maneuver.  B/L fluid level behind TM: - Advised to use Flonase spray x 1-2 weeks and continue taking Claritin.      Meds ordered this encounter  Medications  . ciprofloxacin (CIPRO) 500 MG tablet    Sig: Take 1 tablet by mouth once daily x 5 days. On dialysis days, take after dialysis session.    Dispense:  5 tablet    Refill:  0    Order Specific Question:   Supervising Provider    Answer:   Beatrice Lecher D [[id number]]    Follow-up: Return for DM, HTN, HLD in 3-4 months and FBW (lipid panel, cmp).    Lorrene Reid, PA-C

## 2020-02-12 ENCOUNTER — Encounter: Payer: Self-pay | Admitting: Physician Assistant

## 2020-02-12 ENCOUNTER — Ambulatory Visit (INDEPENDENT_AMBULATORY_CARE_PROVIDER_SITE_OTHER): Payer: PRIVATE HEALTH INSURANCE | Admitting: Physician Assistant

## 2020-02-12 ENCOUNTER — Other Ambulatory Visit: Payer: Self-pay

## 2020-02-12 VITALS — BP 122/69 | HR 98 | Temp 97.9°F | Ht 73.0 in | Wt 280.4 lb

## 2020-02-12 DIAGNOSIS — E785 Hyperlipidemia, unspecified: Secondary | ICD-10-CM

## 2020-02-12 DIAGNOSIS — Z794 Long term (current) use of insulin: Secondary | ICD-10-CM

## 2020-02-12 DIAGNOSIS — H6593 Unspecified nonsuppurative otitis media, bilateral: Secondary | ICD-10-CM

## 2020-02-12 DIAGNOSIS — R339 Retention of urine, unspecified: Secondary | ICD-10-CM | POA: Diagnosis not present

## 2020-02-12 DIAGNOSIS — E1122 Type 2 diabetes mellitus with diabetic chronic kidney disease: Secondary | ICD-10-CM | POA: Diagnosis not present

## 2020-02-12 DIAGNOSIS — M545 Low back pain, unspecified: Secondary | ICD-10-CM

## 2020-02-12 DIAGNOSIS — Z992 Dependence on renal dialysis: Secondary | ICD-10-CM

## 2020-02-12 DIAGNOSIS — N186 End stage renal disease: Secondary | ICD-10-CM

## 2020-02-12 DIAGNOSIS — E1169 Type 2 diabetes mellitus with other specified complication: Secondary | ICD-10-CM | POA: Diagnosis not present

## 2020-02-12 DIAGNOSIS — R3915 Urgency of urination: Secondary | ICD-10-CM

## 2020-02-12 DIAGNOSIS — H8110 Benign paroxysmal vertigo, unspecified ear: Secondary | ICD-10-CM

## 2020-02-12 DIAGNOSIS — I10 Essential (primary) hypertension: Secondary | ICD-10-CM

## 2020-02-12 DIAGNOSIS — N3001 Acute cystitis with hematuria: Secondary | ICD-10-CM

## 2020-02-12 DIAGNOSIS — I480 Paroxysmal atrial fibrillation: Secondary | ICD-10-CM

## 2020-02-12 LAB — POCT URINALYSIS DIPSTICK
Glucose, UA: NEGATIVE
Nitrite, UA: NEGATIVE
Protein, UA: POSITIVE — AB
Spec Grav, UA: 1.02 (ref 1.010–1.025)
Urobilinogen, UA: 0.2 E.U./dL
pH, UA: 5.5 (ref 5.0–8.0)

## 2020-02-12 NOTE — Patient Instructions (Addendum)
Use Flonase x 1-2 weeks; one spray into each nostril once daily  Benign Positional Vertigo Vertigo is the feeling that you or your surroundings are moving when they are not. Benign positional vertigo is the most common form of vertigo. This is usually a harmless condition (benign). This condition is positional. This means that symptoms are triggered by certain movements and positions. This condition can be dangerous if it occurs while you are doing something that could cause harm to you or others. This includes activities such as driving or operating machinery. What are the causes? In many cases, the cause of this condition is not known. It may be caused by a disturbance in an area of the inner ear that helps your brain to sense movement and balance. This disturbance can be caused by:  Viral infection (labyrinthitis).  Head injury.  Repetitive motion, such as jumping, dancing, or running. What increases the risk? You are more likely to develop this condition if:  You are a woman.  You are 64 years of age or older. What are the signs or symptoms? Symptoms of this condition usually happen when you move your head or your eyes in different directions. Symptoms may start suddenly, and usually last for less than a minute. They include:  Loss of balance and falling.  Feeling like you are spinning or moving.  Feeling like your surroundings are spinning or moving.  Nausea and vomiting.  Blurred vision.  Dizziness.  Involuntary eye movement (nystagmus). Symptoms can be mild and cause only minor problems, or they can be severe and interfere with daily life. Episodes of benign positional vertigo may return (recur) over time. Symptoms may improve over time. How is this diagnosed? This condition may be diagnosed based on:  Your medical history.  Physical exam of the head, neck, and ears.  Tests, such as: ? MRI. ? CT scan. ? Eye movement tests. Your health care provider may ask you to  change positions quickly while he or she watches you for symptoms of benign positional vertigo, such as nystagmus. Eye movement may be tested with a variety of exams that are designed to evaluate or stimulate vertigo. ? An electroencephalogram (EEG). This records electrical activity in your brain. ? Hearing tests. You may be referred to a health care provider who specializes in ear, nose, and throat (ENT) problems (otolaryngologist) or a provider who specializes in disorders of the nervous system (neurologist). How is this treated?  This condition may be treated in a session in which your health care provider moves your head in specific positions to adjust your inner ear back to normal. Treatment for this condition may take several sessions. Surgery may be needed in severe cases, but this is rare. In some cases, benign positional vertigo may resolve on its own in 2-4 weeks. Follow these instructions at home: Safety  Move slowly. Avoid sudden body or head movements or certain positions, as told by your health care provider.  Avoid driving until your health care provider says it is safe for you to do so.  Avoid operating heavy machinery until your health care provider says it is safe for you to do so.  Avoid doing any tasks that would be dangerous to you or others if vertigo occurs.  If you have trouble walking or keeping your balance, try using a cane for stability. If you feel dizzy or unstable, sit down right away.  Return to your normal activities as told by your health care provider. Ask your health  care provider what activities are safe for you. General instructions  Take over-the-counter and prescription medicines only as told by your health care provider.  Drink enough fluid to keep your urine pale yellow.  Keep all follow-up visits as told by your health care provider. This is important. Contact a health care provider if:  You have a fever.  Your condition gets worse or you  develop new symptoms.  Your family or friends notice any behavioral changes.  You have nausea or vomiting that gets worse.  You have numbness or a "pins and needles" sensation. Get help right away if you:  Have difficulty speaking or moving.  Are always dizzy.  Faint.  Develop severe headaches.  Have weakness in your legs or arms.  Have changes in your hearing or vision.  Develop a stiff neck.  Develop sensitivity to light. Summary  Vertigo is the feeling that you or your surroundings are moving when they are not. Benign positional vertigo is the most common form of vertigo.  The cause of this condition is not known. It may be caused by a disturbance in an area of the inner ear that helps your brain to sense movement and balance.  Symptoms include loss of balance and falling, feeling that you or your surroundings are moving, nausea and vomiting, and blurred vision.  This condition can be diagnosed based on symptoms, physical exam, and other tests, such as MRI, CT scan, eye movement tests, and hearing tests.  Follow safety instructions as told by your health care provider. You will also be told when to contact your health care provider in case of problems. This information is not intended to replace advice given to you by your health care provider. Make sure you discuss any questions you have with your health care provider. Document Revised: 01/24/2018 Document Reviewed: 01/24/2018 Elsevier Patient Education  [employer].   How to Treat Vertigo at Home with Exercises  What is Vertigo?  Vertigo is a relatively common symptom most often associated with conditions such as sinusitis (inflammation of your sinuses due to viruses, allergies, or bacterial infections), or an inner ear infection or ear trauma.   It can be brought on by trauma (e.g. a blow to the head or whiplash) or more serious things like minor strokes.   Symptoms can also be brought on by normal  degenerative changes to your inner ear that occur with aging.  The condition tends to be more commonly seen in the elderly but it can occur in all ages.    Patients most often complain of dizziness, as if the room is spinning around them.   Symptoms are provoked by quick head movements or changes in position like going from standing to lying in bed, or even turning over in bed.   It may present with nausea and/or vomiting, and can be very debilitating to some folks.    By far the most common cause, known as Benign Paroxysmal Positional Vertigo (BPPV), is categorized by a sudden onset of symptoms, that are intense but short-lived (60 seconds or less), which is triggered by a change in head position.   Symptoms usually dissipate if you stay in one position and do not move your head.   Within the inner ear are collections of calcium carbonate crystals referred to as "otoliths" which may become dislodged from their normal position and migrate into the semicircular canals of the inner ear, throwing off your body's ability to sense where you are in space.  Fig. 921 Anatomy of the Right Osseous Labyrinth. Antonieta Iba. Anatomy of the Human Body. 1918.            What Else Could Be Behind My Vertigo?  Some other causes of vertigo include:  Meniere's disease (disorder of inner ear with ringing in ears, feeling of fullness/pressure within ear, and fluctuating hearing loss) Tumours Neurological disorders e.g. Multiple Sclerosis Motion Sickness (lack of coordination between visual stimuli, inner ear balance and positional sense) Migraine Labyrinthitis (inflammation of the fluid-filled tubes and sacs within the inner ear; may also be associated with changes in hearing) Vestibular neuritis (inflammation of the nerves associated with transmission of sensory info from the inner ear; usually of viral origins)  How it can be treated/cured? While certain medications have been prescribed for vertigo  including Lorazepam your doing well 7 house the house going organizing and getting things ready for sale with the and Meclizine (for motion sickness), there exists no evidence to support a recommendation of any medication in the routine treatment of BPPV.  Clinical trials have demonstrated that repositioning techniques (listed below) are a superior option for management Otis Dials et al., 2008).    Figure above:  (A) Instructions for the modified Epley procedure (MEP) for left ear posterior canal benign paroxysmal positional vertigo (PC-BPPV). For right ear BPPV, the procedure has to be performed in the opposite direction, starting with the head turned to the right side.  1. Start by sitting on a bed with your head turned 45 to the left. Place a pillow behind you so that on lying back it will be under your shoulders.  2. Lie back quickly with shoulders on the pillow, neck extended, and head resting on the bed. In this position, the affected (left) ear is underneath. Wait for 30 secondS.  3. Turn your head 90 to the right (without raising it), and wait again for 30 seconds.  4. Turn your body and head another 90 to the right, and wait for another 30 seconds.  5. Sit up on the right side. This maneuver should be performed three times a day. Repeat this daily until you are free from positional vertigo for 24 hours.   (B) Instructions for the modified Semont maneuver (MSM) for left ear PC-BPPV. For right ear BPPV, the maneuver has to be performed in the opposite direction, starting with the head turned toward the left ear.  1. Sit upright on a bed with your head turned 45 toward the right ear.  2. Drop quickly to the left side, so that your head touches the bed behind your left ear. Wait 30 seconds.  3. Move head and trunk in a swift movement toward the other side without stopping in the upright position, so that your head comes to rest on the right side of your forehead. Wait again for 30 seconds.  4. Sit  up again.  This maneuver should be performed three times a day. Repeat this daily until you are free from positional vertigo symptoms for 24 hours.   (   See the video in the supplementary material on the NeurologyWeb site; go to http://www.neurology.org/content/63/1/150/F1.expansion.html   )     You can also try this motion at home as well- Self-Treatment of Benign Paroxysmal Positional Vertigo Benign Paroxysmal Positioning Vertigo is caused by loose inner ear crystals in the inner ear that migrate while sleeping to the back-bottom inner ear balance canal, the so-called "posterior semi-circular canal." The maneuver demonstrated below is the way to reposition  the loose crystals so that the symptoms caused by the loose crystals go away. You may have a floating, swaying sense while walking or sitting for a few days after this procedure.

## 2020-02-16 LAB — URINE CULTURE

## 2020-02-17 MED ORDER — CIPROFLOXACIN HCL 500 MG PO TABS: ORAL_TABLET | ORAL | 0 refills | Status: DC

## 2020-02-25 ENCOUNTER — Other Ambulatory Visit: Payer: Self-pay

## 2020-02-25 ENCOUNTER — Ambulatory Visit (INDEPENDENT_AMBULATORY_CARE_PROVIDER_SITE_OTHER): Payer: PRIVATE HEALTH INSURANCE | Admitting: Pharmacist

## 2020-02-25 DIAGNOSIS — I4891 Unspecified atrial fibrillation: Secondary | ICD-10-CM

## 2020-02-25 DIAGNOSIS — Z7901 Long term (current) use of anticoagulants: Secondary | ICD-10-CM | POA: Diagnosis not present

## 2020-02-25 DIAGNOSIS — I480 Paroxysmal atrial fibrillation: Secondary | ICD-10-CM

## 2020-02-25 LAB — POCT INR: INR: 2.8 (ref 2.0–3.0)

## 2020-02-25 NOTE — Patient Instructions (Signed)
Description   Continue taking warfarin 1 tablet (3mg ) daily except 1.5 (4.5mg )  tablet each Monday and Friday.  Repeat INR in 4 weeks.

## 2020-02-28 ENCOUNTER — Other Ambulatory Visit (HOSPITAL_COMMUNITY): Payer: Self-pay | Admitting: Physician Assistant

## 2020-03-04 ENCOUNTER — Ambulatory Visit: Payer: PRIVATE HEALTH INSURANCE | Admitting: Internal Medicine

## 2020-03-04 ENCOUNTER — Encounter: Payer: Self-pay | Admitting: Internal Medicine

## 2020-03-04 ENCOUNTER — Other Ambulatory Visit: Payer: Self-pay

## 2020-03-04 VITALS — BP 90/56 | HR 85 | Ht 73.0 in | Wt 282.0 lb

## 2020-03-04 DIAGNOSIS — I480 Paroxysmal atrial fibrillation: Secondary | ICD-10-CM

## 2020-03-04 DIAGNOSIS — Z7901 Long term (current) use of anticoagulants: Secondary | ICD-10-CM

## 2020-03-04 DIAGNOSIS — I471 Supraventricular tachycardia: Secondary | ICD-10-CM

## 2020-03-04 DIAGNOSIS — D6869 Other thrombophilia: Secondary | ICD-10-CM

## 2020-03-04 DIAGNOSIS — Z79899 Other long term (current) drug therapy: Secondary | ICD-10-CM | POA: Diagnosis not present

## 2020-03-04 DIAGNOSIS — N186 End stage renal disease: Secondary | ICD-10-CM

## 2020-03-04 DIAGNOSIS — E669 Obesity, unspecified: Secondary | ICD-10-CM

## 2020-03-04 DIAGNOSIS — Z992 Dependence on renal dialysis: Secondary | ICD-10-CM

## 2020-03-04 DIAGNOSIS — I10 Essential (primary) hypertension: Secondary | ICD-10-CM

## 2020-03-04 DIAGNOSIS — Z794 Long term (current) use of insulin: Secondary | ICD-10-CM

## 2020-03-04 DIAGNOSIS — E1122 Type 2 diabetes mellitus with diabetic chronic kidney disease: Secondary | ICD-10-CM

## 2020-03-04 NOTE — Patient Instructions (Signed)
Medication Instructions:  NO CHANGES *If you need a refill on your cardiac medications before your next appointment, please call your pharmacy*   Lab Work: NOT NEEDED .   Testing/Procedures: NOT NEEDED   Follow-Up: At [facility], you and your health needs are our priority.  As part of our continuing mission to provide you with exceptional heart care, we have created designated Provider Care Teams.  These Care Teams include your primary Cardiologist (physician) and Advanced Practice Providers (APPs -  Physician Assistants and Nurse Practitioners) who all work together to provide you with the care you need, when you need it.  We recommend signing up for the patient portal called "MyChart".  Sign up information is provided on this After Visit Summary.  MyChart is used to connect with patients for Virtual Visits (Telemedicine).  Patients are able to view lab/test results, encounter notes, upcoming appointments, etc.  Non-urgent messages can be sent to your provider as well.   To learn more about what you can do with MyChart, go to [email or web address].    Your next appointment:   6 month(s)  The format for your next appointment:   In Person  Provider:   Gayatri Acharya, MD    

## 2020-03-04 NOTE — Progress Notes (Signed)
Cardiology Office Note:    Date:  03/04/2020   ID:  George Mead., DOB [birth year], MRN [id number]  PCP:  Lorrene Reid, PA-C  Cardiologist:  Elouise Munroe, MD  Electrophysiologist:  None   Referring MD: No ref. provider found   Chief Complaint: Atrial fibrillation  History of Present Illness:    George Everard. is a 64 y.o. male with a history of right renal cell carcinoma s/p nephrectomy in 9/19, ESRD on HD (TTS), HTN, HLD, SVT, DM type II, OSA on CPAP, morbid obesity, and paroxysmal atrial fibrillation. He presents for cardiovascular follow up. He is here with his wife George Lewis.   His main concern today is hypotension that is relatively asymptomatic.  Previously the hypotension has limited his ability to complete a full dialysis run, however he was started on midodrine 10 mg twice daily and more recently has only had 1 instance of a prematurely truncated dialysis run.  He is otherwise doing well.  He denies significant dizziness, lightheadedness or presyncope.  He has seen me previously for a presyncopal episode, I have reminded him of this and he states that this is no longer significant issue.  He is not symptomatic during dialysis.  His wife shows me the log of blood pressures and these are generally in the 100-110 mmHg range systolic.  We have reviewed red flag indications for presentation to the office or the hospital with hypotension.  He is tolerating midodrine well and we have discussed medication usage and precautions.  He is in sinus rhythm today and has recently seen Adline Peals, PA in A. fib clinic.  He continues on amiodarone 100 mg daily, and warfarin for anticoagulation.  He is having no issues with bleeding and is tolerating warfarin well.  Most recent INR 2.8 on 02/25/2020.  He has OSA and is on CPAP and tolerating well.  He has no other significant concerns and is thankful for the care he has received from the A. fib clinic and our office.  He denies chest  pain, shortness of breath, palpitations, PND, orthopnea.  Has mild leg swelling.  Denies recent syncope or presyncope.  Past Medical History:  Diagnosis Date  . Anemia   . Arthritis    knees  . Atrial fibrillation ([place])   . Bilateral renal cysts 03/23/2018   Noted MRI ABD  . Cancer [facility])    right kidney cancer  . Cancer [facility])    pancreatic  . Cholelithiasis 03/19/2018   noted on CT AB/Pelvis  . CKD (chronic kidney disease), stage IV [facility])    nephrologist-- dr Hollie Salk [name]);  05-01-2018 has not started dialysis, scheduled for AV fistula creation 05-07-2018  . Diabetes mellitus without complication ([place])   . Diverticulosis of colon 04/19/2018   Noted on CT abd/pelvis  . GERD (gastroesophageal reflux disease)   . Hepatic steatosis 03/19/2018   Mild diffuse, noted on CT AB/Pelvis  . History of kidney stones   . History of pulmonary embolus (PE) 03/2008   treated with coumadin for 6 months  . History of sepsis 04/20/2018   per discharge note , probable UTI  . Hypertension   . IBS (irritable bowel syndrome)   . Nocturia   . OSA on CPAP    uses CPAP nightly  . Pancreatic lesion    0.4cm cystic per CT 07/ 2019  . Peripheral vascular disease ([place])   . Pneumonia    x 1  . Pulmonary nodule    Solid  27m Right Lower Lobe  . Right renal mass 04/10/2018   new dx--  scheduled for nephrectomy 05-16-2018  . S/P ureteral stent placement: 04/16/2018 04/19/2018    Past Surgical History:  Procedure Laterality Date  . AV FISTULA PLACEMENT Left 05/07/2018   Procedure: Creation of Left arm Radiocephalic Fistula;  Surgeon: CMarty Heck MD;  Location[place] [facility]  Service: Vascular;  Laterality: Left;  . AV FISTULA PLACEMENT Left 05/15/2019   Procedure: Creation of Brachiocephalic fistula left arm;  Surgeon: CWaynetta Sandy MD;  [place] [facility]  Service: Vascular;  Laterality: Left;  . BASCILIC VEIN TRANSPOSITION Left 07/15/2019   Procedure: BASILIC VEIN TRANSPOSITION  SECOND STAGE;  Surgeon: CWaynetta Sandy MD;  Location: [facility]  Service: Vascular;  Laterality: Left;  . COLONOSCOPY    . CYSTOSCOPY/RETROGRADE/URETEROSCOPY/STONE EXTRACTION WITH BASKET  x2 last one 1990s approx.  . CYSTOSCOPY/URETEROSCOPY/HOLMIUM LASER/STENT PLACEMENT Left 04/16/2018   Procedure: CYSTOSCOPY/LEFT URETEROSCOPY/LEFT RETROGRADE/STENT PLACEMENT;  Surgeon: BLucas Mallow MD;  [facility];  Service: Urology;  Laterality: Left;  . EUS N/A 05/03/2018   Procedure: UPPER ENDOSCOPIC ULTRASOUND (EUS) RADIAL;  Surgeon: JMilus Banister MD;  [facility];  Service: Gastroenterology;  Laterality: N/A;  . EUS N/A 05/03/2018   Procedure: UPPER ENDOSCOPIC ULTRASOUND (EUS) LINEAR;  Surgeon: JMilus Banister MD;  [facility];  Service: Gastroenterology;  Laterality: N/A;  . EXTRACORPOREAL SHOCK WAVE LITHOTRIPSY  x3  last one 2004 approx.  .Marland KitchenFINE NEEDLE ASPIRATION N/A 05/03/2018   Procedure: FINE NEEDLE ASPIRATION (FNA) LINEAR;  Surgeon: JMilus Banister MD;  [facility];  Service: Gastroenterology;  Laterality: N/A;  . LAPAROSCOPIC NEPHRECTOMY, HAND ASSISTED Right 05/16/2018   Procedure: HAND ASSISTED LAPAROSCOPIC RIGHT NEPHRECTOMY;  Surgeon: BLucas Mallow MD;  [facility];  Service: Urology;  Laterality: Right;  . left index finger attachment  1980s   3-4 surgeries  . TOE SURGERY  1980s   in beween 2nd and 3rd toes cyst removed  . UPPER GI ENDOSCOPY      Current Medications: Current Meds  Medication Sig  . Accu-Chek FastClix Lancets MISC Use to check fasting blood sugar and 2 hrs after largest meal.  . acetaminophen (TYLENOL) 500 MG tablet Take 1,000 mg by mouth as needed for mild pain or headache.   . allopurinol (ZYLOPRIM) 100 MG tablet Take 1 tablet (100 mg total) by mouth every evening.  .Marland Kitchenamiodarone (PACERONE) 100 MG tablet Take 1 tablet (100 mg total) by mouth daily.  .Marland Kitchenaspirin EC 81 MG EC tablet Take 1 tablet (81 mg total) by  mouth daily. (Patient taking differently: Take 81 mg by mouth every evening. )  . atorvastatin (LIPITOR) 40 MG tablet Take 1 tablet (40 mg total) by mouth daily. (Patient taking differently: Take 40 mg by mouth every evening. )  . AURYXIA 1 GM 210 MG(Fe) tablet Take 210 mg by mouth 3 (three) times daily.  . blood glucose meter kit and supplies Dispense based on patient and insurance preference. Use up to four times daily as directed. (FOR ICD-10 E10.9, E11.9).  .Marland Kitchendiphenhydramine-acetaminophen (TYLENOL PM) 25-500 MG TABS tablet Take 2 tablets by mouth at bedtime as needed (sleep).  . fluticasone (FLONASE) 50 MCG/ACT nasal spray Place 1-2 sprays into both nostrils as needed for allergies or rhinitis.   .Marland Kitchenglucose blood test strip Use to check fasting blood sugar and 2 hrs after largest meal.  . loratadine (CLARITIN) 10 MG tablet Take 10 mg  by mouth every evening.   . midodrine (PROAMATINE) 10 MG tablet Take 10 mg by mouth in the morning and at bedtime.  . nitroGLYCERIN (NITROSTAT) 0.4 MG SL tablet Place 1 tablet (0.4 mg total) under the tongue every 5 (five) minutes as needed for chest pain.  Marland Kitchen omeprazole (PRILOSEC OTC) 20 MG tablet Take 40 mg by mouth daily before breakfast.   . tamsulosin (FLOMAX) 0.4 MG CAPS capsule Take 0.4 mg by mouth daily.  Marland Kitchen warfarin (COUMADIN) 3 MG tablet Take 1 to 1.5 tablets by mouth daily as directed by coumadin clinic  . [DISCONTINUED] ciprofloxacin (CIPRO) 500 MG tablet Take 1 tablet by mouth once daily x 5 days. On dialysis days, take after dialysis session.  . [DISCONTINUED] iron sucrose in sodium chloride 0.9 % 100 mL Iron Sucrose (Venofer)  . [DISCONTINUED] Lidocaine-Prilocaine (EMLA EX) EMLA 2.5-2.5%  . [DISCONTINUED] midodrine (PROAMATINE) 10 MG tablet Take 10 mg by mouth 2 (two) times daily at 8 am and 10 pm. Taking 30 mins prior to Dialysis  . [DISCONTINUED] NON FORMULARY Heparin Sodium (Porcine) 1,000 Units/mL Systemic     Allergies:   Penicillins   Social  History   Socioeconomic History  . Marital status: Married    Spouse name: Not on file  . Number of children: Not on file  . Years of education: Not on file  . Highest education level: Not on file  Occupational History  . Occupation: disabled  Tobacco Use  . Smoking status: Never Smoker  . Smokeless tobacco: Never Used  Vaping Use  . Vaping Use: Never used  Substance and Sexual Activity  . Alcohol use: Never  . Drug use: Never  . Sexual activity: Not on file  Other Topics Concern  . Not on file  Social History Narrative  . Not on file   Social Determinants of Health   Financial Resource Strain:   . Difficulty of Paying Living Expenses:   Food Insecurity:   . Worried About Charity fundraiser in the Last Year:   . Arboriculturist in the Last Year:   Transportation Needs:   . Film/video editor (Medical):   Marland Kitchen Lack of Transportation (Non-Medical):   Physical Activity:   . Days of Exercise per Week:   . Minutes of Exercise per Session:   Stress:   . Feeling of Stress :   Social Connections:   . Frequency of Communication with Friends and Family:   . Frequency of Social Gatherings with Friends and Family:   . Attends Religious Services:   . Active Member of Clubs or Organizations:   . Attends Archivist Meetings:   Marland Kitchen Marital Status:      Family History: The patient's family history includes Alzheimer's disease in his father and mother; Cancer - Other in his sister; Heart attack in his father; Heart disease in his father.  ROS:   Please see the history of present illness.    All other systems reviewed and are negative.  EKGs/Labs/Other Studies Reviewed:    The following studies were reviewed today:  EKG:  SR, PVCs  Recent Labs: 03/06/2019: B Natriuretic Peptide 34.8 04/23/2019: Magnesium 1.9; Platelets 209 07/15/2019: Hemoglobin 13.6 10/07/2019: TSH 1.659 01/03/2020: ALT 17; BUN 39; Creatinine, Ser 7.23; Potassium 5.7; Sodium 140  Recent Lipid  Panel    Component Value Date/Time   CHOL 141 08/28/2019 1041   TRIG 165 (H) 08/28/2019 1041   HDL 29 (L) 08/28/2019 1041   CHOLHDL  4.9 08/28/2019 1041   LDLCALC 83 08/28/2019 1041    Physical Exam:    VS:  BP (!) 90/56 (BP Location: Right Arm, Patient Position: Sitting, Cuff Size: Large)   Pulse 85   Ht '6\' 1"'$  (1.854 m)   Wt 282 lb (127.9 kg)   SpO2 98%   BMI 37.21 kg/m     Wt Readings from Last 5 Encounters:  03/04/20 282 lb (127.9 kg)  02/12/20 280 lb 6.4 oz (127.2 kg)  01/13/20 285 lb (129.3 kg)  01/03/20 282 lb (127.9 kg)  10/07/19 282 lb 12.8 oz (128.3 kg)     Constitutional: No acute distress Eyes: sclera non-icteric, normal conjunctiva and lids ENMT: Mask in place Cardiovascular: regular rhythm, normal rate, no murmurs. S1 and S2 normal. Radial pulses normal bilaterally. No jugular venous distention.  Left upper extremity fistula with appropriate thrill. Respiratory: clear to auscultation bilaterally GI : normal bowel sounds, soft and nontender. No distention.   MSK: extremities warm, well perfused.  Trace bilateral edema.  NEURO: grossly nonfocal exam, moves all extremities. PSYCH: alert and oriented x 3, normal mood and affect.   ASSESSMENT:    1. Paroxysmal atrial fibrillation (HCC)   2. Long term (current) use of anticoagulants   3. Medication management   4. Secondary hypercoagulable state ([place])   5. Hypertension, unspecified type   6. SVT (supraventricular tachycardia) ([place])   7. ESRD on hemodialysis ([place])   8. Type 2 diabetes mellitus with chronic kidney disease on chronic dialysis, with long-term current use of insulin (HCC)   9. Obesity (BMI 30-39.9)    PLAN:    Paroxysmal atrial fibrillation ([place]) - Plan: EKG 12-Lead Currently in sinus rhythm, doing well on amiodarone and warfarin.  No rate control agents on board given hypotension in sinus rhythm.  CHA2DS2-VASc or is 3 for hypertension, diabetes, peripheral arterial disease.  Long term  (current) use of anticoagulants-continues on warfarin, describes some bleeding with heparin administration during dialysis.  Medication management-we discussed indications for aspirin, he has a history of peripheral vascular disease, and with this indication aspirin can be continued.  Will review with pharmacy.  Secondary hypercoagulable state (HCC)-as above, continue on warfarin  Hypertension, unspecified type-has been dealing more with hypotension lately and has been started on midodrine which she is tolerating well.  SVT (supraventricular tachycardia) (HCC)-not an active issue, not on rate control.  ESRD on hemodialysis (HCC)-Tuesday Thursday Saturday, per nephrology.  Total time of encounter: 30 minutes total time of encounter, including 20 minutes spent in face-to-face patient care on the date of this encounter. This time includes coordination of care and counseling regarding above mentioned problem list. Remainder of non-face-to-face time involved reviewing chart documents/testing relevant to the patient encounter and documentation in the medical record. I have independently reviewed documentation from referring provider.   Cherlynn Kaiser, MD [place]  [facility]    Medication Adjustments/Labs and Tests Ordered: Current medicines are reviewed at length with the patient today.  Concerns regarding medicines are outlined above.  Orders Placed This Encounter  Procedures  . EKG 12-Lead   No orders of the defined types were placed in this encounter.   Patient Instructions  Medication Instructions:  NO CHANGES  *If you need a refill on your cardiac medications before your next appointment, please call your pharmacy*   Lab Work: NOT NEEDED   Testing/Procedures: NOT NEEDED   Follow-Up: At [facility], you and your health needs are our priority.  As part of our continuing  mission to provide you with exceptional heart care, we have created designated Provider Care  Teams.  These Care Teams include your primary Cardiologist (physician) and Advanced Practice Providers (APPs -  Physician Assistants and Nurse Practitioners) who all work together to provide you with the care you need, when you need it.  We recommend signing up for the patient portal called "MyChart".  Sign up information is provided on this After Visit Summary.  MyChart is used to connect with patients for Virtual Visits (Telemedicine).  Patients are able to view lab/test results, encounter notes, upcoming appointments, etc.  Non-urgent messages can be sent to your provider as well.   To learn more about what you can do with MyChart, go to [email or web address].    Your next appointment:   6 month(s)  The format for your next appointment:   In Person  Provider:   Cherlynn Kaiser, MD

## 2020-03-06 ENCOUNTER — Other Ambulatory Visit (HOSPITAL_COMMUNITY): Payer: Self-pay | Admitting: Physician Assistant

## 2020-03-09 ENCOUNTER — Other Ambulatory Visit: Payer: Self-pay | Admitting: Physician Assistant

## 2020-03-16 ENCOUNTER — Other Ambulatory Visit: Payer: Self-pay

## 2020-03-16 ENCOUNTER — Ambulatory Visit (INDEPENDENT_AMBULATORY_CARE_PROVIDER_SITE_OTHER): Payer: PRIVATE HEALTH INSURANCE | Admitting: Physician Assistant

## 2020-03-16 DIAGNOSIS — R8290 Unspecified abnormal findings in urine: Secondary | ICD-10-CM

## 2020-03-16 DIAGNOSIS — R300 Dysuria: Secondary | ICD-10-CM

## 2020-03-16 LAB — POCT URINALYSIS DIPSTICK
Bilirubin, UA: NEGATIVE
Glucose, UA: NEGATIVE
Nitrite, UA: NEGATIVE
Protein, UA: POSITIVE — AB
Spec Grav, UA: 1.02 (ref 1.010–1.025)
Urobilinogen, UA: 0.2 E.U./dL
pH, UA: 6 (ref 5.0–8.0)

## 2020-03-18 LAB — URINE CULTURE

## 2020-03-20 ENCOUNTER — Telehealth: Payer: Self-pay | Admitting: Physician Assistant

## 2020-03-20 DIAGNOSIS — N3001 Acute cystitis with hematuria: Secondary | ICD-10-CM

## 2020-03-20 MED ORDER — LEVOFLOXACIN 250 MG PO TABS: 250.0000 mg | ORAL_TABLET | ORAL | 0 refills | Status: DC

## 2020-03-20 NOTE — Telephone Encounter (Signed)
Urine culture positive. Sent rx for Levaquin 250 mg to take every 48 hours. -Andalyn Heckstall,PA-C

## 2020-03-24 ENCOUNTER — Other Ambulatory Visit: Payer: Self-pay

## 2020-03-24 ENCOUNTER — Ambulatory Visit (INDEPENDENT_AMBULATORY_CARE_PROVIDER_SITE_OTHER): Payer: PRIVATE HEALTH INSURANCE

## 2020-03-24 DIAGNOSIS — Z5181 Encounter for therapeutic drug level monitoring: Secondary | ICD-10-CM

## 2020-03-24 DIAGNOSIS — I480 Paroxysmal atrial fibrillation: Secondary | ICD-10-CM | POA: Diagnosis not present

## 2020-03-24 DIAGNOSIS — Z7901 Long term (current) use of anticoagulants: Secondary | ICD-10-CM | POA: Diagnosis not present

## 2020-03-24 DIAGNOSIS — I4891 Unspecified atrial fibrillation: Secondary | ICD-10-CM | POA: Diagnosis not present

## 2020-03-24 LAB — POCT INR: INR: 2.3 (ref 2.0–3.0)

## 2020-03-24 NOTE — Patient Instructions (Signed)
Continue taking warfarin 1 tablet (3mg ) daily except 1.5 (4.5mg )  tablet each Monday and Friday.  Repeat INR in 5 weeks.

## 2020-04-02 ENCOUNTER — Other Ambulatory Visit: Payer: PRIVATE HEALTH INSURANCE

## 2020-04-05 NOTE — Progress Notes (Signed)
Pt completed antibiotic for cystitis. Repeat UA to evaluate for resolution. UA positive for leukocytes, protein, hematuria, and ketones. Sent for urine culture.  Lorrene Reid, PA-C

## 2020-04-08 ENCOUNTER — Ambulatory Visit (INDEPENDENT_AMBULATORY_CARE_PROVIDER_SITE_OTHER)
Admission: RE | Admit: 2020-04-08 | Discharge: 2020-04-08 | Disposition: A | Discharge status: Home / Self Care | Payer: PRIVATE HEALTH INSURANCE | Source: Ambulatory Visit | Attending: Pulmonary Disease | Admitting: Pulmonary Disease

## 2020-04-08 ENCOUNTER — Other Ambulatory Visit: Payer: Self-pay

## 2020-04-08 ENCOUNTER — Encounter: Payer: Self-pay | Admitting: Optometry

## 2020-04-08 DIAGNOSIS — R918 Other nonspecific abnormal finding of lung field: Secondary | ICD-10-CM

## 2020-04-08 LAB — HM DIABETES EYE EXAM

## 2020-04-28 ENCOUNTER — Other Ambulatory Visit: Payer: Self-pay

## 2020-04-28 ENCOUNTER — Ambulatory Visit (INDEPENDENT_AMBULATORY_CARE_PROVIDER_SITE_OTHER): Payer: PRIVATE HEALTH INSURANCE

## 2020-04-28 DIAGNOSIS — Z7901 Long term (current) use of anticoagulants: Secondary | ICD-10-CM | POA: Diagnosis not present

## 2020-04-28 DIAGNOSIS — I480 Paroxysmal atrial fibrillation: Secondary | ICD-10-CM | POA: Diagnosis not present

## 2020-04-28 LAB — POCT INR: INR: 2.3 (ref 2.0–3.0)

## 2020-04-28 NOTE — Patient Instructions (Signed)
Continue taking warfarin 1 tablet (3mg ) daily except 1.5 (4.5mg )  tablet each Monday and Friday.  Repeat INR in 6 weeks.

## 2020-04-29 ENCOUNTER — Telehealth: Payer: Self-pay | Admitting: Pulmonary Disease

## 2020-04-29 NOTE — Telephone Encounter (Signed)
Spoke with the pt's spouse (on [place])  She is asking for the results of CT Chest done 04/08/20  Please advise, thanks!

## 2020-04-30 NOTE — Telephone Encounter (Signed)
We need to schedule this patient for a tele visit with me to review his CT Chest results. His wife called 9/1, requesting results be called. ( The message went to Dr. Fabio Bering box, so I was unaware of the call).I have tried calling her today, and there was no answer. I have left a HIPPA compliant message requesting the patient call the office for the results. I will speak with her about them when she calls, or as a tele visit if you could please schedule. Thanks so much.

## 2020-04-30 NOTE — Telephone Encounter (Signed)
Spoke with patient's wife regarding CT result's per Sela Hilding  Patient's wife needs to make a telephone visit after 4:00 pm because of her work schedule .Scheduled patient for a telephone visit on 9/8 at 4:30pm left a voicemail on patient's wife's phone. If that does not work for patient and his wife she can change the time . Nothing else futher

## 2020-05-05 ENCOUNTER — Other Ambulatory Visit: Payer: Self-pay | Admitting: Acute Care

## 2020-05-05 DIAGNOSIS — R911 Solitary pulmonary nodule: Secondary | ICD-10-CM

## 2020-05-05 NOTE — Progress Notes (Signed)
Order for ct chest placed for 3 months

## 2020-05-05 NOTE — Progress Notes (Signed)
Please schedule patient for a 3 month follow up CT without contrast. Thanks so much

## 2020-05-05 NOTE — Progress Notes (Signed)
Pt. Is scheduled for a tele visit to go over results 05/06/2020

## 2020-05-06 ENCOUNTER — Encounter: Payer: Self-pay | Admitting: Acute Care

## 2020-05-06 ENCOUNTER — Ambulatory Visit (INDEPENDENT_AMBULATORY_CARE_PROVIDER_SITE_OTHER): Payer: PRIVATE HEALTH INSURANCE | Admitting: Acute Care

## 2020-05-06 ENCOUNTER — Other Ambulatory Visit: Payer: Self-pay

## 2020-05-06 DIAGNOSIS — Z85528 Personal history of other malignant neoplasm of kidney: Secondary | ICD-10-CM | POA: Diagnosis not present

## 2020-05-06 DIAGNOSIS — R911 Solitary pulmonary nodule: Secondary | ICD-10-CM | POA: Diagnosis not present

## 2020-05-06 DIAGNOSIS — N19 Unspecified kidney failure: Secondary | ICD-10-CM

## 2020-05-06 NOTE — Progress Notes (Signed)
Virtual Visit via Video Note  I connected with George Lewis. on 05/06/20 at  4:30 PM EDT by a video enabled telemedicine application and verified that I am speaking with the correct person using two identifiers.  Location: Patient: At home Provider: [address], [place], [place], [address]   I discussed the limitations of evaluation and management by telemedicine and the availability of in person appointments. The patient expressed understanding and agreed to proceed.   Synopsis 64 year old gentleman with a left upper lobe lingular lung nodule that has been followed now for several months.  He has a past medical history of renal cell carcinoma status post nephrectomy in 2019.  No chemotherapy following this.  He is ESRD on dialysis now. He is starting the process of evaluation for renal  transplantation  at [facility].Pt is followed by Dr. Valeta Harms.  History of Present Illness: Pt. Presents for follow up regarding CT scan results.Pt had a CT 04/08/2020. There was notation of interval enlargement  of a subpleural nodule within the right lower lobe within the azygoesophageal recess, now measuring 8 mm. Findings are concerning for metastatic disease, as patient has had renal cancer in the past..Dr. Icard has looked at the nodule, which is in a very difficult location  to biopsy. He does not feel this is a nodule that can be reached by either EBUS or navigational bronch.. He was concerned about the stable lingular nodule and the stable nodule at the pancreatic head. The patient has followed up with both his urologist and GI physician and both are not recommending any further biopsy . Urology is recommending a follow up CT Kidney in November 2021. Pt. States he is doing well. He is excited about the fact he is starting the process for renal transplant.He denies any cough, hemoptysis or unintentional weight loss.  No fever, chest pain, orthopnea .   Observations/Objective:  Chest  Imaging: CT Chest 04/08/2020 Interval enlargement of a subpleural nodule within the right lower lobe within the azygoesophageal recess, now measuring 8 mm. Findings are concerning for metastatic disease. 2. Stable 9 mm lingular nodule. 3. Stable 14 mm nodule within the pancreatic head, corresponding to the hypermetabolic nodule seen on 05/21/3006 PET CT examination. 4. Borderline pulmonary arterial enlargement suggesting pulmonary artery hypertension.    CT chest imaging from January 2020, May 2020, July 20 18 June 2019.. Imaging originally showed small lung nodule in the lingula in January which showed enlargement progression it may up to 9 mm x 8 mm. He has documented stability from that point until October, last week which revealed a stable 9 mm x 8 mm lung nodule. Also has a another small nodule within the azygous recess of the right lower lobe that is 4 x 5 mm.   Nuclear medicine pet imaging 62/09/6331: No hypermetabolic activity within the lingular lung nodule.  However there is intense activity within the head of the pancreas associated with a small ovoid intermediate sized lesion concerning for a primary pancreatic malignancy. The patient's images have been independently reviewed by me.     Assessment and Plan: Interval enlargement of subpleural nodule within the right lower lobe within the azygoesophageal recess, now measuring 8 mm. concerning for metastatic disease No amendable to EBUS/ Navigational bronch No hypermetabolic activity on PET 54/5625 Plan 3 month follow up CT Chest to evaluate for stability, resolution or growth We will call you with results, and have Dr.Icard evaluate for any additional procedures/ imaging. Follow up CT  abdomen with urology 06/2020 Follow up with Dr. Lindell Spar NP  in 3 months to review CT results and determine plan of care. Follow up with GI per Dr. Arnoldo Morale  Follow Up Instructions: Follow up with Dr. Lindell Spar NP  in 3 months to  review CT results and determine plan of care Follow up CT abdomen with urology 06/2020 Follow up with GI per Dr. Arnoldo Morale   I discussed the assessment and treatment plan with the patient. The patient was provided an opportunity to ask questions and all were answered. The patient agreed with the plan and demonstrated an understanding of the instructions.   The patient was advised to call back or seek an in-person evaluation if the symptoms worsen or if the condition fails to improve as anticipated.  I provided 32 minutes of non-face-to-face time during this encounter.   George Spatz, NP 05/07/2020 10:37 PM

## 2020-05-07 ENCOUNTER — Encounter: Payer: Self-pay | Admitting: Acute Care

## 2020-05-07 NOTE — Patient Instructions (Addendum)
It is good to talk with you today. We will do a 3 month follow up CT Chest  We will call you with results, and have Dr.Icard evaluate for any additional procedures/ imaging. Follow up CT abdomen with urology 06/2020 Follow up with GI per Dr. Arnoldo Morale Follow up with Dr. Lindell Spar NP  in 3 months to review CT results and determine plan of care. Call if you need [place] sooner Please contact office for sooner follow up if symptoms do not improve or worsen or seek emergency care

## 2020-05-13 ENCOUNTER — Ambulatory Visit: Payer: PRIVATE HEALTH INSURANCE | Admitting: Physician Assistant

## 2020-05-20 NOTE — Progress Notes (Signed)
Thanks for helping coordinate Lance Muss, DO [facility] 05/20/2020 3:30 PM

## 2020-06-03 ENCOUNTER — Telehealth: Payer: Self-pay | Admitting: Physician Assistant

## 2020-06-03 NOTE — Telephone Encounter (Signed)
Patient wife advised pt should go to [facility] for eval or per Herb Grays we can place referral to PT.   Patient wife decline. AS, CMA

## 2020-06-03 NOTE — Telephone Encounter (Signed)
Patient's wife called and he had been seen for fluid in his ears and he is still dizzy from it and the exercises were not helping. Is there are any meds that can help?

## 2020-06-09 ENCOUNTER — Other Ambulatory Visit: Payer: Self-pay

## 2020-06-09 ENCOUNTER — Telehealth: Payer: Self-pay | Admitting: *Deleted

## 2020-06-09 ENCOUNTER — Ambulatory Visit (INDEPENDENT_AMBULATORY_CARE_PROVIDER_SITE_OTHER): Payer: PRIVATE HEALTH INSURANCE

## 2020-06-09 DIAGNOSIS — I480 Paroxysmal atrial fibrillation: Secondary | ICD-10-CM | POA: Diagnosis not present

## 2020-06-09 DIAGNOSIS — Z7901 Long term (current) use of anticoagulants: Secondary | ICD-10-CM

## 2020-06-09 LAB — POCT INR: INR: 2.5 (ref 2.0–3.0)

## 2020-06-09 NOTE — Telephone Encounter (Signed)
A message was left, re: his follow up visit. 

## 2020-06-09 NOTE — Patient Instructions (Signed)
Continue taking warfarin 1 tablet (3mg ) daily except 1.5 (4.5mg )  tablet each Monday and Friday.  Repeat INR in 6 weeks.

## 2020-06-21 ENCOUNTER — Other Ambulatory Visit: Payer: Self-pay | Admitting: Physician Assistant

## 2020-07-15 ENCOUNTER — Encounter: Payer: Self-pay | Admitting: Internal Medicine

## 2020-07-15 ENCOUNTER — Ambulatory Visit: Payer: PRIVATE HEALTH INSURANCE | Admitting: Internal Medicine

## 2020-07-15 ENCOUNTER — Other Ambulatory Visit: Payer: Self-pay

## 2020-07-15 VITALS — BP 100/68 | HR 84 | Ht 73.0 in | Wt 285.2 lb

## 2020-07-15 DIAGNOSIS — E669 Obesity, unspecified: Secondary | ICD-10-CM | POA: Diagnosis not present

## 2020-07-15 DIAGNOSIS — E782 Mixed hyperlipidemia: Secondary | ICD-10-CM

## 2020-07-15 DIAGNOSIS — E1122 Type 2 diabetes mellitus with diabetic chronic kidney disease: Secondary | ICD-10-CM | POA: Diagnosis not present

## 2020-07-15 DIAGNOSIS — Z992 Dependence on renal dialysis: Secondary | ICD-10-CM

## 2020-07-15 DIAGNOSIS — N186 End stage renal disease: Secondary | ICD-10-CM

## 2020-07-15 LAB — POCT GLYCOSYLATED HEMOGLOBIN (HGB A1C): Hemoglobin A1C: 5.5 % (ref 4.0–5.6)

## 2020-07-15 NOTE — Addendum Note (Signed)
Addended by: Lauralyn Primes on: 07/15/2020 08:45 AM   Modules accepted: Orders

## 2020-07-15 NOTE — Progress Notes (Signed)
[name]., male   DOB: 04-15-1956, 64 y.o.   MRN: [id number]   This visit occurred during the SARS-CoV-2 public health emergency.  Safety protocols were in place, including screening questions prior to the visit, additional usage of staff PPE, and extensive cleaning of exam room while observing appropriate contact time as indicated for disinfecting solutions.   HPI: George Lewis. is a 64 y.o.-year-old male, referred by his PCP, No ref. provider found, for management of DM2, dx in 11/2018, noninsulin-dependent, controlled, without long-term complications.  Last visit 6 months ago.  Last year, he changed his diet and eliminating sweets and also started to exercise regularly.  He lost 30 pounds.  Since then, he gained some back but his diabetes remains well controlled.  He will be on the renal transplant list at [facility] - he is completing the investigation for this.  Reviewed history: He was dx with DM in 11/2018. He was admitted at that time with a glucose in the 900s, hydrated, and started on insulin.  He was discharged on insulin but his sugars improved and he even developed hypoglycemia.  His insulin doses were decreased  but he continued to have low blood sugars and to feel dizzy and tired and has blurry vision. At last visit, we ruled out type 1 diabetes: Component     Latest Ref Rng & Units 09/13/2019  Glucose, Plasma     65 - 99 mg/dL 118 (H)  C-Peptide     0.80 - 3.85 ng/mL 16.30 (H)  ZNT8 Antibodies     U/mL <15  Glutamic Acid Decarb Ab     <5 IU/mL <5  Islet Cell Ab     Neg:<1:1 Negative  After this, we stopped insulin completely.  Reviewed his HbA1c levels: Lab Results  Component Value Date   HGBA1C 5.1 01/13/2020   HGBA1C 5.1 08/28/2019   HGBA1C 5.1 05/28/2019   HGBA1C 7.4 (A) 02/11/2019   HGBA1C 14.9 (H) 12/19/2018   She is not on any diabetic medications for now.  He was previously on: - Metformin, which was stopped due to CKD -  Humalog and Lantus which were stopped due to good control  Pt checks his sugars once a day per review of his log: - am: 85-115 >> 77-102 >> 88-114, 123 >> 86-127, 134, 135 - 2h after b'fast: n/c - before lunch: 68-96, 212 >> 69-105, 126 >> n/c - 2h after lunch: 52, 58, 76-135 >> n/c - before dinner: 59-133 >> 67-121, 131, 153 >> n/c >> 42, 90-123 - 2h after dinner: 67-144 >> n/c - bedtime: n/c >> 88-103 >> n/c >> 61, 68-113 - nighttime: n/c Lowest sugar was 52 >> 67 >> 61 >> 42 (?) after dialysis; he has hypoglycemia awareness in the 70s. Highest sugar was 926 >> 153 >> 126 >> 135.  Glucometer: Accu-Chek Guide  Pt's meals are: - Breakfast: egg x1, [place] sausage or liver pudding, toast - Lunch: grilled chicken + salad, pinneapple - Dinner: grilled chicken or fish, hamburger, salad or veggies - Snacks, fruit   -+ ESRD, s/p R nephrectomy 2019 for RCC - sees Dr. Hollie Salk -now on HD TTS stent 02/2019- last BUN/creatinine:  Lab Results  Component Value Date   BUN 39 (H) 01/03/2020   BUN 54 (H) 07/15/2019   CREATININE 7.23 (H) 01/03/2020   CREATININE 4.70 (H) 07/15/2019   -+ HL; last set of lipids: Lab Results  Component Value Date   CHOL 141 08/28/2019  HDL 29 (L) 08/28/2019   LDLCALC 83 08/28/2019   TRIG 165 (H) 08/28/2019   CHOLHDL 4.9 08/28/2019  On Lipitor 40.  - last eye exam was on 04/08/2020: No DR  - no numbness and tingling in his feet.  Pt has no FH of DM.  She also has a history of hepatic steatosis.  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  He did not need surgery so far and he only needs follow-up.  ROS: Constitutional: no weight gain/no weight loss, no fatigue, no subjective hyperthermia, no subjective hypothermia Eyes: no blurry vision, no xerophthalmia ENT: no sore throat, no nodules palpated in neck, no dysphagia, no odynophagia, no hoarseness Cardiovascular: no CP/no SOB/no palpitations/no leg  swelling Respiratory: no cough/no SOB/no wheezing Gastrointestinal: no N/no V/no D/no C/no acid reflux Musculoskeletal: no muscle aches/no joint aches Skin: no rashes, no hair loss Neurological: no tremors/no numbness/no tingling/no dizziness  I reviewed pt's medications, allergies, PMH, social hx, family hx, and changes were documented in the history of present illness. Otherwise, unchanged from my initial visit note.  Past Medical History:  Diagnosis Date  . Anemia   . Arthritis    knees  . Atrial fibrillation ([place])   . Bilateral renal cysts 03/23/2018   Noted MRI ABD  . Cancer [facility])    right kidney cancer  . Cancer [facility])    pancreatic  . Cholelithiasis 03/19/2018   noted on CT AB/Pelvis  . CKD (chronic kidney disease), stage IV [facility])    nephrologist-- dr Hollie Salk [name]);  05-01-2018 has not started dialysis, scheduled for AV fistula creation 05-07-2018  . Diabetes mellitus without complication ([place])   . Diverticulosis of colon 04/19/2018   Noted on CT abd/pelvis  . GERD (gastroesophageal reflux disease)   . Hepatic steatosis 03/19/2018   Mild diffuse, noted on CT AB/Pelvis  . History of kidney stones   . History of pulmonary embolus (PE) 03/2008   treated with coumadin for 6 months  . History of sepsis 04/20/2018   per discharge note , probable UTI  . Hypertension   . IBS (irritable bowel syndrome)   . Nocturia   . OSA on CPAP    uses CPAP nightly  . Pancreatic lesion    0.4cm cystic per CT 07/ 2019  . Peripheral vascular disease ([place])   . Pneumonia    x 1  . Pulmonary nodule    Solid 65m Right Lower Lobe  . Right renal mass 04/10/2018   new dx--  scheduled for nephrectomy 05-16-2018  . S/P ureteral stent placement: 04/16/2018 04/19/2018   Past Surgical History:  Procedure Laterality Date  . AV FISTULA PLACEMENT Left 05/07/2018   Procedure: Creation of Left arm Radiocephalic Fistula;  Surgeon: CMarty Heck MD;  [place] [facility]  Service:  Vascular;  Laterality: Left;  . AV FISTULA PLACEMENT Left 05/15/2019   Procedure: Creation of Brachiocephalic fistula left arm;  Surgeon: CWaynetta Sandy MD;  [place]  Service: Vascular;  Laterality: Left;  . BASCILIC VEIN TRANSPOSITION Left 07/15/2019   Procedure: BASILIC VEIN TRANSPOSITION SECOND STAGE;  Surgeon: CWaynetta Sandy MD;  [place] [facility]  Service: Vascular;  Laterality: Left;  . COLONOSCOPY    . CYSTOSCOPY/RETROGRADE/URETEROSCOPY/STONE EXTRACTION WITH BASKET  x2 last one 1990s approx.  . CYSTOSCOPY/URETEROSCOPY/HOLMIUM LASER/STENT PLACEMENT Left 04/16/2018   Procedure: CYSTOSCOPY/LEFT URETEROSCOPY/LEFT RETROGRADE/STENT PLACEMENT;  Surgeon: BLucas Mallow MD;  Location: [facility];  Service: Urology;  Laterality: Left;  . EUS N/A 05/03/2018   Procedure: UPPER ENDOSCOPIC ULTRASOUND (EUS) RADIAL;  Surgeon: Milus Banister, MD;  [facility];  Service: Gastroenterology;  Laterality: N/A;  . EUS N/A 05/03/2018   Procedure: UPPER ENDOSCOPIC ULTRASOUND (EUS) LINEAR;  Surgeon: Milus Banister, MD;  [facility];  Service: Gastroenterology;  Laterality: N/A;  . EXTRACORPOREAL SHOCK WAVE LITHOTRIPSY  x3  last one 2004 approx.  Marland Kitchen FINE NEEDLE ASPIRATION N/A 05/03/2018   Procedure: FINE NEEDLE ASPIRATION (FNA) LINEAR;  Surgeon: Milus Banister, MD;  [facility];  Service: Gastroenterology;  Laterality: N/A;  . LAPAROSCOPIC NEPHRECTOMY, HAND ASSISTED Right 05/16/2018   Procedure: HAND ASSISTED LAPAROSCOPIC RIGHT NEPHRECTOMY;  Surgeon: Lucas Mallow, MD;  Location: [facility];  Service: Urology;  Laterality: Right;  . left index finger attachment  1980s   3-4 surgeries  . TOE SURGERY  1980s   in beween 2nd and 3rd toes cyst removed  . UPPER GI ENDOSCOPY     Social History   Socioeconomic History  . Marital status: Married    Spouse name: Not on file  . Number of children: Not on file  . Years of education: Not on file  . Highest  education level: Not on file  Occupational History  . Occupation: disabled  Tobacco Use  . Smoking status: Never Smoker  . Smokeless tobacco: Never Used  Vaping Use  . Vaping Use: Never used  Substance and Sexual Activity  . Alcohol use: Never  . Drug use: Never  . Sexual activity: Not on file  Other Topics Concern  . Not on file  Social History Narrative  . Not on file   Social Determinants of Health   Financial Resource Strain:   . Difficulty of Paying Living Expenses: Not on file  Food Insecurity:   . Worried About Charity fundraiser in the Last Year: Not on file  . Ran Out of Food in the Last Year: Not on file  Transportation Needs:   . Lack of Transportation (Medical): Not on file  . Lack of Transportation (Non-Medical): Not on file  Physical Activity:   . Days of Exercise per Week: Not on file  . Minutes of Exercise per Session: Not on file  Stress:   . Feeling of Stress : Not on file  Social Connections:   . Frequency of Communication with Friends and Family: Not on file  . Frequency of Social Gatherings with Friends and Family: Not on file  . Attends Religious Services: Not on file  . Active Member of Clubs or Organizations: Not on file  . Attends Archivist Meetings: Not on file  . Marital Status: Not on file  Intimate Partner Violence:   . Fear of Current or Ex-Partner: Not on file  . Emotionally Abused: Not on file  . Physically Abused: Not on file  . Sexually Abused: Not on file   Medications: Current Outpatient Medications on File Prior to Visit  Medication Sig Dispense Refill  . Accu-Chek FastClix Lancets MISC Use to check fasting blood sugar and 2 hrs after largest meal. 100 each 0  . ACCU-CHEK GUIDE test strip USE 1 STRIP TO CHECK FASTING BLOOD SUGAR AND 2 HOURS AFTER LARGEST MEAL 100 each 3  . acetaminophen (TYLENOL) 500 MG tablet Take 1,000 mg by mouth as needed for mild pain or headache.     . allopurinol (ZYLOPRIM) 100 MG tablet Take 1  tablet (100 mg  total) by mouth every evening. 30 tablet 0  . amiodarone (PACERONE) 100 MG tablet Take 1 tablet (100 mg total) by mouth daily. 30 tablet 3  . amiodarone (PACERONE) 200 MG tablet Take 0.5 tablets (100 mg total) by mouth daily. 45 tablet 1  . aspirin EC 81 MG EC tablet Take 1 tablet (81 mg total) by mouth daily. (Patient taking differently: Take 81 mg by mouth every evening. ) 30 tablet 0  . atorvastatin (LIPITOR) 40 MG tablet Take 1 tablet (40 mg total) by mouth daily. (Patient taking differently: Take 40 mg by mouth every evening. ) 30 tablet 0  . AURYXIA 1 GM 210 MG(Fe) tablet Take 210 mg by mouth 3 (three) times daily.    . blood glucose meter kit and supplies Dispense based on patient and insurance preference. Use up to four times daily as directed. (FOR ICD-10 E10.9, E11.9). 1 each 0  . diphenhydramine-acetaminophen (TYLENOL PM) 25-500 MG TABS tablet Take 2 tablets by mouth at bedtime as needed (sleep).    . fluticasone (FLONASE) 50 MCG/ACT nasal spray Place 1-2 sprays into both nostrils as needed for allergies or rhinitis.     Marland Kitchen levofloxacin (LEVAQUIN) 250 MG tablet Take 1 tablet (250 mg total) by mouth every other day. 3 tablet 0  . loratadine (CLARITIN) 10 MG tablet Take 10 mg by mouth every evening.     . midodrine (PROAMATINE) 10 MG tablet Take 10 mg by mouth in the morning and at bedtime.    . nitroGLYCERIN (NITROSTAT) 0.4 MG SL tablet Place 1 tablet (0.4 mg total) under the tongue every 5 (five) minutes as needed for chest pain. 14 tablet 12  . omeprazole (PRILOSEC OTC) 20 MG tablet Take 40 mg by mouth daily before breakfast.     . tamsulosin (FLOMAX) 0.4 MG CAPS capsule Take 0.4 mg by mouth daily.    Marland Kitchen warfarin (COUMADIN) 3 MG tablet Take 1 to 1.5 tablets by mouth daily as directed by coumadin clinic 135 tablet 0   No current facility-administered medications on file prior to visit.   Allergies  Allergen Reactions  . Penicillins Rash and Other (See Comments)    Has  patient had a PCN reaction causing immediate rash, facial/tongue/throat swelling, SOB or lightheadedness with hypotension: yes Has patient had a PCN reaction causing severe rash involving mucus membranes or skin necrosis: no Has patient had a PCN reaction that required hospitalization: no Has patient had a PCN reaction occurring within the last 10 years: no If all of the above answers are "NO", then may proceed with Cephalosporin use.    Family History  Problem Relation Age of Onset  . Alzheimer's disease Mother   . Alzheimer's disease Father   . Heart disease Father   . Heart attack Father   . Cancer - Other Sister    PE: BP 100/68   Pulse 84   Ht 6' 1" (1.854 m)   Wt 285 lb 3.2 oz (129.4 kg)   SpO2 98%   BMI 37.63 kg/m  Wt Readings from Last 3 Encounters:  07/15/20 285 lb 3.2 oz (129.4 kg)  03/04/20 282 lb (127.9 kg)  02/12/20 280 lb 6.4 oz (127.2 kg)   Constitutional: overweight, in NAD Eyes: PERRLA, EOMI, no exophthalmos ENT: moist mucous membranes, no thyromegaly, no cervical lymphadenopathy Cardiovascular: RRR, No MRG Respiratory: CTA B Gastrointestinal: abdomen soft, NT, ND, BS+ Musculoskeletal: no deformities, strength intact in all 4 Skin: moist, warm, no rashes Neurological: no tremor with outstretched  hands, DTR normal in all 4  ASSESSMENT: 1. DM2, insulin-dependent, uncontrolled, without long-term complications He does not have a history of pancreatitis or family history of medullary thyroid cancer  2. HL  3. Obesity class 2  PLAN:  1. Patient with relatively recent onset of diabetes, previously on Metformin and basal-bolus insulin regimen, now off all of the diabetic medications due to improvement of his blood sugars after improving his diet and starting exercise.  Last year, he started to develop low blood sugars and, after we ruled out type 1 diabetes, we stopped his long-acting insulin completely.  We are limited in his diabetic regimen by his end-stage  renal disease.  In the future, if we need intensification of treatment, we can consider a GLP-1 receptor agonist.  At last visit, his HbA1c was excellent, at 5.1%, and his sugars were all at goal.  I advised him to check his sugars at different times of the day, but we did not start him on any diabetic medication. -At today's visit, we reviewed his sugar log together.  He is checking sugars once a day, alternating checks, but writes all his blood sugars in the same: In the morning I advised him to try to write them in the corresponding columns.  However, they are almost all at goal so for now there is no need to escalate his regimen. - I suggested to:  Patient Instructions  Please continue off diabetic medications.  Please return in 6 months with your sugar log.   - we checked his HbA1c: 5.5% (slightly higher) - advised to check sugars at different times of the day - 1x a day, rotating check times - advised for yearly eye exams >> he is UTD - return to clinic in 6 months  2. HL -Reviewed latest lipid panel from 07/2019: LDL slightly above goal, triglycerides slightly high, HDL low: Lab Results  Component Value Date   CHOL 141 08/28/2019   HDL 29 (L) 08/28/2019   LDLCALC 83 08/28/2019   TRIG 165 (H) 08/28/2019   CHOLHDL 4.9 08/28/2019  -Continues Lipitor 40 without side effects  3. Obesity class 2 -After changing his diet and starting exercise, he lost 30 pounds last year, however, he gained 11 pounds before last visit and remained stable since then -We stopped insulin before last visit and this should also help with weight loss  Philemon Kingdom, MD PhD [facility] Endocrinology

## 2020-07-15 NOTE — Patient Instructions (Signed)
Please continue off diabetic medications.  Please return in 6 months with your sugar log.

## 2020-07-21 ENCOUNTER — Ambulatory Visit (INDEPENDENT_AMBULATORY_CARE_PROVIDER_SITE_OTHER): Payer: PRIVATE HEALTH INSURANCE

## 2020-07-21 ENCOUNTER — Other Ambulatory Visit: Payer: Self-pay

## 2020-07-21 DIAGNOSIS — I480 Paroxysmal atrial fibrillation: Secondary | ICD-10-CM | POA: Diagnosis not present

## 2020-07-21 DIAGNOSIS — Z7901 Long term (current) use of anticoagulants: Secondary | ICD-10-CM | POA: Diagnosis not present

## 2020-07-21 LAB — POCT INR: INR: 2.4 (ref 2.0–3.0)

## 2020-07-21 NOTE — Patient Instructions (Signed)
Continue taking warfarin 1 tablet (3mg ) daily except 1.5 (4.5mg )  tablet each Monday and Friday.  Repeat INR in 6 weeks.

## 2020-07-29 ENCOUNTER — Ambulatory Visit (INDEPENDENT_AMBULATORY_CARE_PROVIDER_SITE_OTHER)
Admission: RE | Admit: 2020-07-29 | Discharge: 2020-07-29 | Disposition: A | Discharge status: Home / Self Care | Payer: PRIVATE HEALTH INSURANCE | Source: Ambulatory Visit | Attending: Acute Care | Admitting: Acute Care

## 2020-07-29 ENCOUNTER — Other Ambulatory Visit: Payer: Self-pay

## 2020-07-29 DIAGNOSIS — R911 Solitary pulmonary nodule: Secondary | ICD-10-CM

## 2020-08-03 ENCOUNTER — Ambulatory Visit (INDEPENDENT_AMBULATORY_CARE_PROVIDER_SITE_OTHER): Payer: PRIVATE HEALTH INSURANCE | Admitting: Physician Assistant

## 2020-08-03 ENCOUNTER — Encounter: Payer: Self-pay | Admitting: Physician Assistant

## 2020-08-03 ENCOUNTER — Other Ambulatory Visit: Payer: Self-pay

## 2020-08-03 VITALS — BP 95/62 | HR 124 | Temp 99.0°F | Ht 73.0 in | Wt 289.1 lb

## 2020-08-03 DIAGNOSIS — R911 Solitary pulmonary nodule: Secondary | ICD-10-CM | POA: Diagnosis not present

## 2020-08-03 DIAGNOSIS — E1169 Type 2 diabetes mellitus with other specified complication: Secondary | ICD-10-CM | POA: Diagnosis not present

## 2020-08-03 DIAGNOSIS — E669 Obesity, unspecified: Secondary | ICD-10-CM

## 2020-08-03 DIAGNOSIS — Z992 Dependence on renal dialysis: Secondary | ICD-10-CM

## 2020-08-03 DIAGNOSIS — N186 End stage renal disease: Secondary | ICD-10-CM

## 2020-08-03 DIAGNOSIS — C641 Malignant neoplasm of right kidney, except renal pelvis: Secondary | ICD-10-CM

## 2020-08-03 DIAGNOSIS — Z794 Long term (current) use of insulin: Secondary | ICD-10-CM

## 2020-08-03 DIAGNOSIS — E785 Hyperlipidemia, unspecified: Secondary | ICD-10-CM

## 2020-08-03 DIAGNOSIS — E1122 Type 2 diabetes mellitus with diabetic chronic kidney disease: Secondary | ICD-10-CM

## 2020-08-03 DIAGNOSIS — I10 Essential (primary) hypertension: Secondary | ICD-10-CM

## 2020-08-03 NOTE — Assessment & Plan Note (Signed)
-  Followed by Urology. Pt is s/p nephrectomy.

## 2020-08-03 NOTE — Assessment & Plan Note (Addendum)
-  Continue dialysis sessions. -Continue to follow up with Urology and [facility].

## 2020-08-03 NOTE — Progress Notes (Signed)
Established Patient Office Visit  Subjective:  [name]., male    DOB: 10/06/1955  Age: 64 y.o. MRN: [id number]  CC:  Chief Complaint  Patient presents with  . Diabetes  . Hyperlipidemia  . Hypertension    HPI [name]. presents for follow up on diabetes mellitus, hyperlipidemia, and hypertension.  Diabetes mellitus: Followed by Endocrinology. Pt denies increased urination or thirst. Pt continues to monitor carbohydrates and glucose. No hypoglycemic events. Checking glucose at home. Reports highest glucose reading is 120 and lowest have been 85-89, postprandial.   HTN: Pt denies new onset chest pain, palpitations, dizziness, headache or lower extremity swelling. States Urologist discontinued Flomax because it was making his blood pressure go up. Checks BP at home and readings fluctuate from 105-137/75-79. Pt follows a low salt diet and monitors water intake.  HLD: Pt taking medication as directed without issues. Denies side effects. Continues to watch his diet. Usually eats chicken, fruits and some vegetables. Due to colder weather has not been as active working outside but is trying to stay as active as possible.  ESRD: On dialysis. Has started pre-transplant evaluation for kidney transplant through [facility].  Lung nodule: Reports had recent chest CT to follow up on lung nodule that had changed. Has an upcoming follow up with Urologist to discuss report. Denies unintentional weight loss, cough, dyspnea or hemoptysis.     Past Medical History:  Diagnosis Date  . Anemia   . Arthritis    knees  . Atrial fibrillation ([place])   . Bilateral renal cysts 03/23/2018   Noted MRI ABD  . Cancer [facility])    right kidney cancer  . Cancer [facility])    pancreatic  . Cholelithiasis 03/19/2018   noted on CT AB/Pelvis  . CKD (chronic kidney disease), stage IV [facility])    nephrologist-- dr Hollie Salk [name]);  05-01-2018 has not started dialysis, scheduled  for AV fistula creation 05-07-2018  . Diabetes mellitus without complication ([place])   . Diverticulosis of colon 04/19/2018   Noted on CT abd/pelvis  . GERD (gastroesophageal reflux disease)   . Hepatic steatosis 03/19/2018   Mild diffuse, noted on CT AB/Pelvis  . History of kidney stones   . History of pulmonary embolus (PE) 03/2008   treated with coumadin for 6 months  . History of sepsis 04/20/2018   per discharge note , probable UTI  . Hypertension   . IBS (irritable bowel syndrome)   . Nocturia   . OSA on CPAP    uses CPAP nightly  . Pancreatic lesion    0.4cm cystic per CT 07/ 2019  . Peripheral vascular disease ([place])   . Pneumonia    x 1  . Pulmonary nodule    Solid 87m Right Lower Lobe  . Right renal mass 04/10/2018   new dx--  scheduled for nephrectomy 05-16-2018  . S/P ureteral stent placement: 04/16/2018 04/19/2018    Past Surgical History:  Procedure Laterality Date  . AV FISTULA PLACEMENT Left 05/07/2018   Procedure: Creation of Left arm Radiocephalic Fistula;  Surgeon: CMarty Heck MD;  [place] [facility] [place] [facility]  Service: Vascular;  Laterality: Left;  . AV FISTULA PLACEMENT Left 05/15/2019   Procedure: Creation of Brachiocephalic fistula left arm;  Surgeon: CWaynetta Sandy MD;  [place] [facility]  Service: Vascular;  Laterality: Left;  . BASCILIC VEIN TRANSPOSITION Left 07/15/2019   Procedure: BASILIC VEIN TRANSPOSITION SECOND STAGE;  Surgeon: CWaynetta Sandy MD;  [facility];  Service: Vascular;  Laterality: Left;  . COLONOSCOPY    . CYSTOSCOPY/RETROGRADE/URETEROSCOPY/STONE EXTRACTION WITH BASKET  x2 last one 1990s approx.  . CYSTOSCOPY/URETEROSCOPY/HOLMIUM LASER/STENT PLACEMENT Left 04/16/2018   Procedure: CYSTOSCOPY/LEFT URETEROSCOPY/LEFT RETROGRADE/STENT PLACEMENT;  Surgeon: Lucas Mallow, MD;  [facility]: [facility];  Service: Urology;  Laterality: Left;  . EUS N/A 05/03/2018   Procedure: UPPER ENDOSCOPIC ULTRASOUND (EUS) RADIAL;  Surgeon:  Milus Banister, MD;  Location: [facility];  Service: Gastroenterology;  Laterality: N/A;  . EUS N/A 05/03/2018   Procedure: UPPER ENDOSCOPIC ULTRASOUND (EUS) LINEAR;  Surgeon: Milus Banister, MD;  Location: [facility];  Service: Gastroenterology;  Laterality: N/A;  . EXTRACORPOREAL SHOCK WAVE LITHOTRIPSY  x3  last one 2004 approx.  Marland Kitchen FINE NEEDLE ASPIRATION N/A 05/03/2018   Procedure: FINE NEEDLE ASPIRATION (FNA) LINEAR;  Surgeon: Milus Banister, MD;  [facility]: [facility];  Service: Gastroenterology;  Laterality: N/A;  . LAPAROSCOPIC NEPHRECTOMY, HAND ASSISTED Right 05/16/2018   Procedure: HAND ASSISTED LAPAROSCOPIC RIGHT NEPHRECTOMY;  Surgeon: Lucas Mallow, MD;  [facility]: [facility];  Service: Urology;  Laterality: Right;  . left index finger attachment  1980s   3-4 surgeries  . TOE SURGERY  1980s   in beween 2nd and 3rd toes cyst removed  . UPPER GI ENDOSCOPY      Family History  Problem Relation Age of Onset  . Alzheimer's disease Mother   . Alzheimer's disease Father   . Heart disease Father   . Heart attack Father   . Cancer - Other Sister     Social History   Socioeconomic History  . Marital status: Married    Spouse name: Not on file  . Number of children: Not on file  . Years of education: Not on file  . Highest education level: Not on file  Occupational History  . Occupation: disabled  Tobacco Use  . Smoking status: Never Smoker  . Smokeless tobacco: Never Used  Vaping Use  . Vaping Use: Never used  Substance and Sexual Activity  . Alcohol use: Never  . Drug use: Never  . Sexual activity: Not on file  Other Topics Concern  . Not on file  Social History Narrative  . Not on file   Social Determinants of Health   Financial Resource Strain:   . Difficulty of Paying Living Expenses: Not on file  Food Insecurity:   . Worried About Charity fundraiser in the Last Year: Not on file  . Ran Out of Food in the Last Year: Not on file  Transportation Needs:    . Lack of Transportation (Medical): Not on file  . Lack of Transportation (Non-Medical): Not on file  Physical Activity:   . Days of Exercise per Week: Not on file  . Minutes of Exercise per Session: Not on file  Stress:   . Feeling of Stress : Not on file  Social Connections:   . Frequency of Communication with Friends and Family: Not on file  . Frequency of Social Gatherings with Friends and Family: Not on file  . Attends Religious Services: Not on file  . Active Member of Clubs or Organizations: Not on file  . Attends Archivist Meetings: Not on file  . Marital Status: Not on file  Intimate Partner Violence:   . Fear of Current or Ex-Partner: Not on file  . Emotionally Abused: Not on file  . Physically Abused: Not on file  . Sexually Abused: Not on file  Outpatient Medications Prior to Visit  Medication Sig Dispense Refill  . Accu-Chek FastClix Lancets MISC Use to check fasting blood sugar and 2 hrs after largest meal. 100 each 0  . ACCU-CHEK GUIDE test strip USE 1 STRIP TO CHECK FASTING BLOOD SUGAR AND 2 HOURS AFTER LARGEST MEAL 100 each 3  . acetaminophen (TYLENOL) 500 MG tablet Take 1,000 mg by mouth as needed for mild pain or headache.     . allopurinol (ZYLOPRIM) 100 MG tablet Take 1 tablet (100 mg total) by mouth every evening. 30 tablet 0  . amiodarone (PACERONE) 200 MG tablet Take 0.5 tablets (100 mg total) by mouth daily. 45 tablet 1  . aspirin EC 81 MG EC tablet Take 1 tablet (81 mg total) by mouth daily. (Patient taking differently: Take 81 mg by mouth every evening. ) 30 tablet 0  . atorvastatin (LIPITOR) 40 MG tablet Take 1 tablet (40 mg total) by mouth daily. (Patient taking differently: Take 40 mg by mouth every evening. ) 30 tablet 0  . AURYXIA 1 GM 210 MG(Fe) tablet Take 210 mg by mouth 3 (three) times daily.    . blood glucose meter kit and supplies Dispense based on patient and insurance preference. Use up to four times daily as directed. (FOR ICD-10  E10.9, E11.9). 1 each 0  . diphenhydramine-acetaminophen (TYLENOL PM) 25-500 MG TABS tablet Take 2 tablets by mouth at bedtime as needed (sleep).    . fluticasone (FLONASE) 50 MCG/ACT nasal spray Place 1-2 sprays into both nostrils as needed for allergies or rhinitis.     Marland Kitchen loratadine (CLARITIN) 10 MG tablet Take 10 mg by mouth every evening.     . nitroGLYCERIN (NITROSTAT) 0.4 MG SL tablet Place 1 tablet (0.4 mg total) under the tongue every 5 (five) minutes as needed for chest pain. 14 tablet 12  . omeprazole (PRILOSEC OTC) 20 MG tablet Take 40 mg by mouth daily before breakfast.     . warfarin (COUMADIN) 3 MG tablet Take 1 to 1.5 tablets by mouth daily as directed by coumadin clinic 135 tablet 0  . midodrine (PROAMATINE) 10 MG tablet Take 10 mg by mouth in the morning and at bedtime.    . tamsulosin (FLOMAX) 0.4 MG CAPS capsule Take 0.4 mg by mouth daily.     No facility-administered medications prior to visit.    Allergies  Allergen Reactions  . Penicillins Rash and Other (See Comments)    Has patient had a PCN reaction causing immediate rash, facial/tongue/throat swelling, SOB or lightheadedness with hypotension: yes Has patient had a PCN reaction causing severe rash involving mucus membranes or skin necrosis: no Has patient had a PCN reaction that required hospitalization: no Has patient had a PCN reaction occurring within the last 10 years: no If all of the above answers are "NO", then may proceed with Cephalosporin use.     ROS Review of Systems A fourteen system review of systems was performed and found to be positive as per HPI.  Objective:    Physical Exam General:  Well Developed, well nourished, in no acute distress   Neuro:  Alert and oriented,  extra-ocular muscles intact  HEENT:  Normocephalic, atraumatic, neck supple Skin:  no gross rash, warm, pink. Cardiac:  RRR, S1 S2 Respiratory:  ECTA B/L w/o wheezing, Not using accessory muscles, speaking in full sentences-  unlabored. Vascular:  Ext warm, no cyanosis apprec. Psych:  No HI/SI, judgement and insight good, Euthymic mood. Full Affect.   BP 95/62  Pulse (!) 124   Temp 99 F (37.2 C) (Oral)   Ht '6\' 1"'  (1.854 m)   Wt 289 lb 1.6 oz (131.1 kg)   SpO2 100% Comment: on RA  BMI 38.14 kg/m  Wt Readings from Last 3 Encounters:  08/03/20 289 lb 1.6 oz (131.1 kg)  07/15/20 285 lb 3.2 oz (129.4 kg)  03/04/20 282 lb (127.9 kg)     Health Maintenance Due  Topic Date Due  . Hepatitis C Screening  Never done  . PNEUMOCOCCAL POLYSACCHARIDE VACCINE AGE 70-64 HIGH RISK  Never done  . TETANUS/TDAP  Never done  . COLONOSCOPY  Never done  . URINE MICROALBUMIN  05/27/2020    There are no preventive care reminders to display for this patient.  Lab Results  Component Value Date   TSH 1.659 10/07/2019   Lab Results  Component Value Date   WBC 7.6 04/23/2019   HGB 13.6 07/15/2019   HCT 40.0 07/15/2019   MCV 82.1 04/23/2019   PLT 209 04/23/2019   Lab Results  Component Value Date   NA 140 01/03/2020   K 5.7 (H) 01/03/2020   CO2 30 01/03/2020   GLUCOSE 104 (H) 01/03/2020   BUN 39 (H) 01/03/2020   CREATININE 7.23 (H) 01/03/2020   BILITOT 1.1 01/03/2020   ALKPHOS 71 01/03/2020   AST 22 01/03/2020   ALT 17 01/03/2020   PROT 7.6 01/03/2020   ALBUMIN 3.8 01/03/2020   CALCIUM 9.0 01/03/2020   ANIONGAP 12 01/03/2020   Lab Results  Component Value Date   CHOL 141 08/28/2019   Lab Results  Component Value Date   HDL 29 (L) 08/28/2019   Lab Results  Component Value Date   LDLCALC 83 08/28/2019   Lab Results  Component Value Date   TRIG 165 (H) 08/28/2019   Lab Results  Component Value Date   CHOLHDL 4.9 08/28/2019   Lab Results  Component Value Date   HGBA1C 5.5 07/15/2020      Assessment & Plan:   Problem List Items Addressed This Visit      Cardiovascular and Mediastinum   HTN (hypertension)    -Controlled without medications. Per pt, not taking Midodrine. -Continue  to follow up with cardiology. -Continue ambulatory BP/pulse monitoring. -Continue low sodium diet. -Will continue to monitor.        Endocrine   Type 2 diabetes mellitus with chronic kidney disease on chronic dialysis, with long-term current use of insulin ([place]) - Primary    -Controlled with diet. -Continue to follow up with Endocrinology. -Continue to monitor carbohydrates and glucose. -Continue to stay as active as possible. -Will continue to monitor.        Genitourinary   End stage renal disease ([place])    -Continue dialysis sessions. -Continue to follow up with Urology and [facility].      Renal cell carcinoma, right ([place])    -Followed by Urology. Pt is s/p nephrectomy.        Other   Obesity (BMI 30-39.9) (Chronic)    -Associated with diabetes mellitus, hyperlipidemia and hypertension.  -Recommend to continue with monitoring diet and stay as active as possible. -Follow a Mediterranean diet.       Other Visit Diagnoses    Hyperlipidemia associated with type 2 diabetes mellitus ([place])       Lung nodule         Hyperlipidemia associated with type 2 diabetes mellitus: -Last lipid panel: Total cholesterol 141, triglycerides 165, HDL 29, LDL  83 (goal<70). -Continue Lipitor 40 mg. If LDL remains above goal recommend medication adjustments. -Follow a diet low in saturated and transfats. -Schedule a lab visit for fasting blood work to repeat lipid panel and hepatic function. -Will continue to monitor alongside cardiology.   Lung nodule: -Followed by pulmonology and urology. Recommend to follow up with Urology as scheduled. -Reviewed Pulmonology consult note 05/06/2020, biopsy was not recommended by GI and Urologist due to difficult location to biopsy. -Chest CT performed 04/08/2020 IMPRESSION: 1. Interval enlargement of a subpleural nodule within the right lower lobe within the azygoesophageal recess, now measuring 8 mm. Findings are concerning for  metastatic disease. 2. Stable 9 mm lingular nodule. 3. Stable 14 mm nodule within the pancreatic head, corresponding to the hypermetabolic nodule seen on 01/48/4039 PET CT examination. 4. Borderline pulmonary arterial enlargement suggesting pulmonary artery hypertension.  -Chest CT performed 07/29/2020 IMPRESSION: 1. Slight interval enlargement of lesion in the neck of the pancreas, potentially metastatic or related to primary pancreatic neoplasm, given slow enlargement would favor indolent neoplasm such as islet cell tumor. 2. Bilateral pulmonary nodules displaying slow enlargement over time also suspicious for metastatic disease specifically with respect to medial RIGHT lung base and in the lingula. 3. Three-vessel coronary artery disease. 4. Aortic atherosclerosis.   No orders of the defined types were placed in this encounter.   Follow-up: Return in about 4 months (around 12/02/2020) for DM, HTN, HLD; FBW (lipid panel, cmp, cbc) in 1-3 weeks.   Note:  This note was prepared with assistance of Dragon voice recognition software. Occasional wrong-word or sound-a-like substitutions may have occurred due to the inherent limitations of voice recognition software.  Lorrene Reid, PA-C

## 2020-08-03 NOTE — Assessment & Plan Note (Signed)
-  Controlled with diet. -Continue to follow up with Endocrinology. -Continue to monitor carbohydrates and glucose. -Continue to stay as active as possible. -Will continue to monitor.

## 2020-08-03 NOTE — Assessment & Plan Note (Addendum)
-  Associated with diabetes mellitus, hyperlipidemia and hypertension.  -Recommend to continue with monitoring diet and stay as active as possible. -Follow a Mediterranean diet.

## 2020-08-03 NOTE — Patient Instructions (Signed)

## 2020-08-03 NOTE — Assessment & Plan Note (Signed)
-  Controlled without medications. Per pt, not taking Midodrine. -Continue to follow up with cardiology. -Continue ambulatory BP/pulse monitoring. -Continue low sodium diet. -Will continue to monitor.

## 2020-08-05 ENCOUNTER — Other Ambulatory Visit: Payer: Self-pay

## 2020-08-05 ENCOUNTER — Other Ambulatory Visit: Payer: PRIVATE HEALTH INSURANCE

## 2020-08-05 DIAGNOSIS — I10 Essential (primary) hypertension: Secondary | ICD-10-CM

## 2020-08-05 DIAGNOSIS — E1122 Type 2 diabetes mellitus with diabetic chronic kidney disease: Secondary | ICD-10-CM

## 2020-08-05 DIAGNOSIS — D721 Eosinophilia, unspecified: Secondary | ICD-10-CM

## 2020-08-05 DIAGNOSIS — E785 Hyperlipidemia, unspecified: Secondary | ICD-10-CM

## 2020-08-05 DIAGNOSIS — D649 Anemia, unspecified: Secondary | ICD-10-CM

## 2020-08-06 LAB — COMPREHENSIVE METABOLIC PANEL
ALT: 16 IU/L (ref 0–44)
AST: 22 IU/L (ref 0–40)
Albumin/Globulin Ratio: 1.4 (ref 1.2–2.2)
Albumin: 4.4 g/dL (ref 3.8–4.8)
Alkaline Phosphatase: 53 IU/L (ref 44–121)
BUN/Creatinine Ratio: 4 — ABNORMAL LOW (ref 10–24)
BUN: 29 mg/dL — ABNORMAL HIGH (ref 8–27)
Bilirubin Total: 0.5 mg/dL (ref 0.0–1.2)
CO2: 21 mmol/L (ref 20–29)
Calcium: 9.3 mg/dL (ref 8.6–10.2)
Chloride: 96 mmol/L (ref 96–106)
Creatinine, Ser: 6.88 mg/dL — ABNORMAL HIGH (ref 0.76–1.27)
GFR calc Af Amer: 9 mL/min/{1.73_m2} — ABNORMAL LOW (ref >59)
GFR calc non Af Amer: 8 mL/min/{1.73_m2} — ABNORMAL LOW (ref >59)
Globulin, Total: 3.2 g/dL (ref 1.5–4.5)
Glucose: 98 mg/dL (ref 65–99)
Potassium: 4 mmol/L (ref 3.5–5.2)
Sodium: 139 mmol/L (ref 134–144)
Total Protein: 7.6 g/dL (ref 6.0–8.5)

## 2020-08-06 LAB — CBC WITH DIFFERENTIAL/PLATELET
Basophils Absolute: 0.1 10*3/uL (ref 0.0–0.2)
Basos: 1 %
EOS (ABSOLUTE): 0.3 10*3/uL (ref 0.0–0.4)
Eos: 3 %
Hematocrit: 32.6 % — ABNORMAL LOW (ref 37.5–51.0)
Hemoglobin: 11.2 g/dL — ABNORMAL LOW (ref 13.0–17.7)
Immature Grans (Abs): 0.3 10*3/uL — ABNORMAL HIGH (ref 0.0–0.1)
Immature Granulocytes: 2 %
Lymphocytes Absolute: 2.4 10*3/uL (ref 0.7–3.1)
Lymphs: 21 %
MCH: 31.5 pg (ref 26.6–33.0)
MCHC: 34.4 g/dL (ref 31.5–35.7)
MCV: 92 fL (ref 79–97)
Monocytes Absolute: 1 10*3/uL — ABNORMAL HIGH (ref 0.1–0.9)
Monocytes: 9 %
Neutrophils Absolute: 7.2 10*3/uL — ABNORMAL HIGH (ref 1.4–7.0)
Neutrophils: 64 %
Platelets: 232 10*3/uL (ref 150–450)
RBC: 3.55 x10E6/uL — ABNORMAL LOW (ref 4.14–5.80)
RDW: 13.8 % (ref 11.6–15.4)
WBC: 11.2 10*3/uL — ABNORMAL HIGH (ref 3.4–10.8)

## 2020-08-06 LAB — LIPID PANEL
Chol/HDL Ratio: 5.5 ratio — ABNORMAL HIGH (ref 0.0–5.0)
Cholesterol, Total: 177 mg/dL (ref 100–199)
HDL: 32 mg/dL — ABNORMAL LOW (ref >39)
LDL Chol Calc (NIH): 106 mg/dL — ABNORMAL HIGH (ref 0–99)
Triglycerides: 224 mg/dL — ABNORMAL HIGH (ref 0–149)
VLDL Cholesterol Cal: 39 mg/dL (ref 5–40)

## 2020-08-06 NOTE — Progress Notes (Signed)
I have attempted to call the patient with the results of his CT screening scan.  There was no answer at either his mobile number or his home phone number.  I have left a message on the mobile number requesting that he call the office for his results.  There was no option for leaving message on the home machine. Langley Gauss, if the patient does call back his screening scan has some progressive changes.  Dr. Valeta Harms would like to see the patient in the office for a 15-minute time slot next week if possible as he is in the office and has some openings. There are openings 12/15 at 3:15 PM, and 12/16 at 11:30 AM 9:30 AM and 3 PM. If the patient calls back please schedule him for one of the slots.  If he does not call back I will try him again on Monday. Fax results to PCP and let him know we are trying to get the patient scheduled for follow-up with Dr. Valeta Harms.  Thanks so much

## 2020-08-06 NOTE — Progress Notes (Signed)
I attempted to call the patient with the results of his scan again today. There was no answer.

## 2020-08-12 ENCOUNTER — Other Ambulatory Visit: Payer: Self-pay

## 2020-08-12 ENCOUNTER — Telehealth: Payer: Self-pay | Admitting: Pulmonary Disease

## 2020-08-12 ENCOUNTER — Ambulatory Visit: Payer: PRIVATE HEALTH INSURANCE | Admitting: Pulmonary Disease

## 2020-08-12 VITALS — BP 126/60 | HR 112 | Temp 97.1°F | Ht 73.0 in | Wt 285.1 lb

## 2020-08-12 DIAGNOSIS — R918 Other nonspecific abnormal finding of lung field: Secondary | ICD-10-CM

## 2020-08-12 DIAGNOSIS — Z85528 Personal history of other malignant neoplasm of kidney: Secondary | ICD-10-CM

## 2020-08-12 DIAGNOSIS — K8689 Other specified diseases of pancreas: Secondary | ICD-10-CM | POA: Diagnosis not present

## 2020-08-12 DIAGNOSIS — N186 End stage renal disease: Secondary | ICD-10-CM | POA: Diagnosis not present

## 2020-08-12 DIAGNOSIS — Z7901 Long term (current) use of anticoagulants: Secondary | ICD-10-CM

## 2020-08-12 NOTE — Progress Notes (Signed)
Synopsis: Referred in October 2020 for lung nodule by Lorrene Reid, PA-C  Subjective:   [name]. GENDER: male DOB: 03-Feb-1956, MRN: [id number]  Chief Complaint  Patient presents with  . Follow-up    4 month follow up     Patient with a past medical history of atrial fibrillation on Coumadin, OSA on CPAP, diabetes, CKD.  Patient seen today in consultation for lingular lung nodule.  Patient has a past medical history of renal cell carcinoma diagnosed in 2019 status post nephrectomy.  Subsequent scans have been stable.  Did find a lingular lung nodule in January 2020.  Subsequent CT imaging follow-up in May showed enlargement to 9 x 8 mm.  Subsequent imaging in July as well as recently in October 2020 both demonstrated stability of the nodule at 9 x 8 mm.  He is also on dialysis Tuesday Thursday Saturday.  He has a recent fistula failure with a tunneled cath currently in place and plans for AV fistula revision.  Overall very worried and concerned about the lung nodule.  He has been told that it could represent spread of his previous cancer.  Today we reviewed the images in the office.  Otherwise he has no symptoms.  No respiratory symptoms.  Lifelong non-smoker.  Lifelong no alcohol use.  Patient denies hemoptysis fevers chills night sweats weight loss.  OV 08/07/2019: Patient here for follow-up after PET scan imaging.  Lung nodule on PET scan was non-PET avid however there is a lesion found within the pancreas.  Discussed this today in the office.  Patient has no complaints from respiratory standpoint today.  He is recently underwent a new fistula revision with Dr. Kasandra Knudsen.  Going back to see him next week.  Still has his tunneled catheter.  Going to dialysis Tuesday Thursday Saturday.  OV 08/12/2020:Here today for follow-up regarding recent CT chest that was completed on 07/29/2020: Had interval enlargement in the lesion of the neck of the pancreas.  Bilateral slowly  enlarging pulmonary nodules suspicious for metastasis.  Today, overall doing well today.  He was able to get his fistula revised and now on a regular Tuesday Thursdays Saturday schedule for dialysis.  Considerations at this time for work-up for potential kidney transplantation.  Unfortunately recent CT scan of the chest revealed enlarging nodules concerning for malignancy.  From a respiratory standpoint patient has no complaints.   Past Medical History:  Diagnosis Date  . Anemia   . Arthritis    knees  . Atrial fibrillation ([place])   . Bilateral renal cysts 03/23/2018   Noted MRI ABD  . Cancer [facility])    right kidney cancer  . Cancer [facility])    pancreatic  . Cholelithiasis 03/19/2018   noted on CT AB/Pelvis  . CKD (chronic kidney disease), stage IV [facility])    nephrologist-- dr Hollie Salk [name]);  05-01-2018 has not started dialysis, scheduled for AV fistula creation 05-07-2018  . Diabetes mellitus without complication ([place])   . Diverticulosis of colon 04/19/2018   Noted on CT abd/pelvis  . GERD (gastroesophageal reflux disease)   . Hepatic steatosis 03/19/2018   Mild diffuse, noted on CT AB/Pelvis  . History of kidney stones   . History of pulmonary embolus (PE) 03/2008   treated with coumadin for 6 months  . History of sepsis 04/20/2018   per discharge note , probable UTI  . Hypertension   . IBS (irritable bowel syndrome)   . Nocturia   . OSA on  CPAP    uses CPAP nightly  . Pancreatic lesion    0.4cm cystic per CT 07/ 2019  . Peripheral vascular disease ([place])   . Pneumonia    x 1  . Pulmonary nodule    Solid 43m Right Lower Lobe  . Right renal mass 04/10/2018   new dx--  scheduled for nephrectomy 05-16-2018  . S/P ureteral stent placement: 04/16/2018 04/19/2018     Family History  Problem Relation Age of Onset  . Alzheimer's disease Mother   . Alzheimer's disease Father   . Heart disease Father   . Heart attack Father   . Cancer - Other Sister      Past Surgical  History:  Procedure Laterality Date  . AV FISTULA PLACEMENT Left 05/07/2018   Procedure: Creation of Left arm Radiocephalic Fistula;  Surgeon: CMarty Heck MD;  Location[facility]  Service: Vascular;  Laterality: Left;  . AV FISTULA PLACEMENT Left 05/15/2019   Procedure: Creation of Brachiocephalic fistula left arm;  Surgeon: CWaynetta Sandy MD;  Location: [facility]  Service: Vascular;  Laterality: Left;  . BASCILIC VEIN TRANSPOSITION Left 07/15/2019   Procedure: BASILIC VEIN TRANSPOSITION SECOND STAGE;  Surgeon: CWaynetta Sandy MD;  Location: [facility]  Service: Vascular;  Laterality: Left;  . COLONOSCOPY    . CYSTOSCOPY/RETROGRADE/URETEROSCOPY/STONE EXTRACTION WITH BASKET  x2 last one 1990s approx.  . CYSTOSCOPY/URETEROSCOPY/HOLMIUM LASER/STENT PLACEMENT Left 04/16/2018   Procedure: CYSTOSCOPY/LEFT URETEROSCOPY/LEFT RETROGRADE/STENT PLACEMENT;  Surgeon: BLucas Mallow MD;  [facility];  Service: Urology;  Laterality: Left;  . EUS N/A 05/03/2018   Procedure: UPPER ENDOSCOPIC ULTRASOUND (EUS) RADIAL;  Surgeon: JMilus Banister MD;  [facility];  Service: Gastroenterology;  Laterality: N/A;  . EUS N/A 05/03/2018   Procedure: UPPER ENDOSCOPIC ULTRASOUND (EUS) LINEAR;  Surgeon: JMilus Banister MD;  [facility];  Service: Gastroenterology;  Laterality: N/A;  . EXTRACORPOREAL SHOCK WAVE LITHOTRIPSY  x3  last one 2004 approx.  .Marland KitchenFINE NEEDLE ASPIRATION N/A 05/03/2018   Procedure: FINE NEEDLE ASPIRATION (FNA) LINEAR;  Surgeon: JMilus Banister MD;  [facility];  Service: Gastroenterology;  Laterality: N/A;  . LAPAROSCOPIC NEPHRECTOMY, HAND ASSISTED Right 05/16/2018   Procedure: HAND ASSISTED LAPAROSCOPIC RIGHT NEPHRECTOMY;  Surgeon: BLucas Mallow MD;  [facility];  Service: Urology;  Laterality: Right;  . left index finger attachment  1980s   3-4 surgeries  . TOE SURGERY  1980s   in beween 2nd and 3rd toes cyst removed  . UPPER GI  ENDOSCOPY      Social History   Socioeconomic History  . Marital status: Married    Spouse name: Not on file  . Number of children: Not on file  . Years of education: Not on file  . Highest education level: Not on file  Occupational History  . Occupation: disabled  Tobacco Use  . Smoking status: Never Smoker  . Smokeless tobacco: Never Used  Vaping Use  . Vaping Use: Never used  Substance and Sexual Activity  . Alcohol use: Never  . Drug use: Never  . Sexual activity: Not on file  Other Topics Concern  . Not on file  Social History Narrative  . Not on file   Social Determinants of Health   Financial Resource Strain: Not on file  Food Insecurity: Not on file  Transportation Needs: Not on file  Physical Activity: Not on file  Stress: Not on file  Social Connections: Not on file  Intimate Partner Violence: Not on file     Allergies  Allergen Reactions  . Penicillins Rash and Other (See Comments)    Has patient had a PCN reaction causing immediate rash, facial/tongue/throat swelling, SOB or lightheadedness with hypotension: yes Has patient had a PCN reaction causing severe rash involving mucus membranes or skin necrosis: no Has patient had a PCN reaction that required hospitalization: no Has patient had a PCN reaction occurring within the last 10 years: no If all of the above answers are "NO", then may proceed with Cephalosporin use.      Outpatient Medications Prior to Visit  Medication Sig Dispense Refill  . Accu-Chek FastClix Lancets MISC Use to check fasting blood sugar and 2 hrs after largest meal. 100 each 0  . ACCU-CHEK GUIDE test strip USE 1 STRIP TO CHECK FASTING BLOOD SUGAR AND 2 HOURS AFTER LARGEST MEAL 100 each 3  . acetaminophen (TYLENOL) 500 MG tablet Take 1,000 mg by mouth as needed for mild pain or headache.     . allopurinol (ZYLOPRIM) 100 MG tablet Take 1 tablet (100 mg total) by mouth every evening. 30 tablet 0  . amiodarone (PACERONE) 200 MG  tablet Take 0.5 tablets (100 mg total) by mouth daily. 45 tablet 1  . aspirin EC 81 MG EC tablet Take 1 tablet (81 mg total) by mouth daily. (Patient taking differently: Take 81 mg by mouth every evening.) 30 tablet 0  . atorvastatin (LIPITOR) 40 MG tablet Take 1 tablet (40 mg total) by mouth daily. (Patient taking differently: Take 40 mg by mouth every evening.) 30 tablet 0  . AURYXIA 1 GM 210 MG(Fe) tablet Take 210 mg by mouth in the morning and at bedtime.    . blood glucose meter kit and supplies Dispense based on patient and insurance preference. Use up to four times daily as directed. (FOR ICD-10 E10.9, E11.9). 1 each 0  . diphenhydramine-acetaminophen (TYLENOL PM) 25-500 MG TABS tablet Take 2 tablets by mouth at bedtime as needed (sleep).    . fluticasone (FLONASE) 50 MCG/ACT nasal spray Place 1-2 sprays into both nostrils as needed for allergies or rhinitis.     Marland Kitchen loratadine (CLARITIN) 10 MG tablet Take 10 mg by mouth every evening.     . nitroGLYCERIN (NITROSTAT) 0.4 MG SL tablet Place 1 tablet (0.4 mg total) under the tongue every 5 (five) minutes as needed for chest pain. 14 tablet 12  . omeprazole (PRILOSEC OTC) 20 MG tablet Take 40 mg by mouth daily before breakfast.     . warfarin (COUMADIN) 3 MG tablet Take 1 to 1.5 tablets by mouth daily as directed by coumadin clinic 135 tablet 0   No facility-administered medications prior to visit.    Review of Systems  Constitutional: Negative for chills, fever, malaise/fatigue and weight loss.  HENT: Negative for hearing loss, sore throat and tinnitus.   Eyes: Negative for blurred vision and double vision.  Respiratory: Negative for cough, hemoptysis, sputum production, shortness of breath, wheezing and stridor.   Cardiovascular: Negative for chest pain, palpitations, orthopnea, leg swelling and PND.  Gastrointestinal: Negative for abdominal pain, constipation, diarrhea, heartburn, nausea and vomiting.  Genitourinary: Negative for dysuria,  hematuria and urgency.  Musculoskeletal: Negative for joint pain and myalgias.  Skin: Negative for itching and rash.  Neurological: Negative for dizziness, tingling, weakness and headaches.  Endo/Heme/Allergies: Negative for environmental allergies. Does not bruise/bleed easily.  Psychiatric/Behavioral: Negative for depression. The patient is not nervous/anxious and does not have insomnia.  All other systems reviewed and are negative.    Objective:  Physical Exam Vitals reviewed.  Constitutional:      General: He is not in acute distress.    Appearance: He is well-developed and well-nourished. He is obese.  HENT:     Head: Normocephalic and atraumatic.     Mouth/Throat:     Mouth: Oropharynx is clear and moist.  Eyes:     General: No scleral icterus.    Conjunctiva/sclera: Conjunctivae normal.     Pupils: Pupils are equal, round, and reactive to light.  Neck:     Vascular: No JVD.     Trachea: No tracheal deviation.  Cardiovascular:     Rate and Rhythm: Normal rate and regular rhythm.     Pulses: Intact distal pulses.     Heart sounds: Normal heart sounds. No murmur heard.   Pulmonary:     Effort: Pulmonary effort is normal. No tachypnea, accessory muscle usage or respiratory distress.     Breath sounds: Normal breath sounds. No stridor. No wheezing, rhonchi or rales.  Abdominal:     General: Bowel sounds are normal. There is no distension.     Palpations: Abdomen is soft.     Tenderness: There is no abdominal tenderness.  Musculoskeletal:        General: No tenderness or edema.     Cervical back: Neck supple.  Lymphadenopathy:     Cervical: No cervical adenopathy.  Skin:    General: Skin is warm and dry.     Capillary Refill: Capillary refill takes less than 2 seconds.     Findings: No rash.  Neurological:     Mental Status: He is alert and oriented to person, place, and time.  Psychiatric:        Mood and Affect: Mood and affect normal.        Behavior:  Behavior normal.      Vitals:   08/12/20 1140  BP: 126/60  Pulse: (!) 112  Temp: (!) 97.1 F (36.2 C)  TempSrc: Tympanic  SpO2: 98%  Weight: 285 lb 2 oz (129.3 kg)  Height: '6\' 1"'  (1.854 m)   98% on RA BMI Readings from Last 3 Encounters:  08/12/20 37.62 kg/m  08/03/20 38.14 kg/m  07/15/20 37.63 kg/m   Wt Readings from Last 3 Encounters:  08/12/20 285 lb 2 oz (129.3 kg)  08/03/20 289 lb 1.6 oz (131.1 kg)  07/15/20 285 lb 3.2 oz (129.4 kg)     CBC    Component Value Date/Time   WBC 11.2 (H) 08/05/2020 0852   WBC 7.6 04/23/2019 1330   RBC 3.55 (L) 08/05/2020 0852   RBC 4.58 04/23/2019 1330   HGB 11.2 (L) 08/05/2020 0852   HCT 32.6 (L) 08/05/2020 0852   PLT 232 08/05/2020 0852   MCV 92 08/05/2020 0852   MCH 31.5 08/05/2020 0852   MCH 27.3 04/23/2019 1330   MCHC 34.4 08/05/2020 0852   MCHC 33.2 04/23/2019 1330   RDW 13.8 08/05/2020 0852   LYMPHSABS 2.4 08/05/2020 0852   MONOABS 1.0 12/19/2018 0048   EOSABS 0.3 08/05/2020 0852   BASOSABS 0.1 08/05/2020 0852     Chest Imaging: CT chest imaging from January 2020, May 2020, July 20 18 June 2019. All of the images reviewed today in succession with the patient. Imaging originally showed small lung nodule in the lingula in January which showed enlargement progression it may up to 9 mm x 8 mm. He has documented stability from that  point until October, last week which revealed a stable 9 mm x 8 mm lung nodule. Also has a another small nodule within the azygous recess of the right lower lobe that is 4 x 5 mm. The patient's images have been independently reviewed by me.    Nuclear medicine pet imaging 82/04/5620: No hypermetabolic activity within the lingular lung nodule.  However there is intense activity within the head of the pancreas associated with a small ovoid intermediate sized lesion concerning for a primary pancreatic malignancy. The patient's images have been independently reviewed by me.   December  2021: CT chest: Interval enlargement of lesion in the neck of the pancreas has been followed before.  Bilateral pulmonary nodules with increase in size as well concerning for malignancy.  Pulmonary Functions Testing Results: No flowsheet data found.  FeNO: None   Pathology: None   Echocardiogram: None   Heart Catheterization: None     Assessment & Plan:     ICD-10-CM   1. Lung nodules  R91.8 Ambulatory referral to Pulmonology    Procedural/ Surgical Case Request: VIDEO BRONCHOSCOPY WITH ENDOBRONCHIAL NAVIGATION  2. H/O renal cell cancer  Z85.528   3. History of renal cell cancer  Z85.528   4. Pancreatic mass  K86.89   5. ESRD (end stage renal disease) ([place])  N18.6   6. Anticoagulated on Coumadin  Z79.01     Discussion:  64 year old gentleman with here today for follow-up of his left upper lobe lingular lung nodule as well as a right lower lobe lung nodule.  He has a history of renal cell carcinoma status post nephrectomy in 2019.  He has had subsequent serial images for follow-up of these nodules.  Unfortunately they have shown progression and enlargement.  At this point the lingular nodule is now 13 mm in largest cross-section the lower lobe nodule in the right is 9 mm in largest cross-section.  Also has a pancreatic lesion that has slowly been enlarging.  He follows with Dr. Barry Dienes from [place] regarding this.  Plan: Today in the office we discussed the risk benefits and alternatives of proceeding with tissue diagnosis of the enlarging lung nodules. Since he is considering transplantation for the kidney I believe it is imperative that we help figure out whether or not the enlarging nodules are malignant. The CT evidence of its short-term growth between August and now would suggest concern for malignancy.  We discussed options to include percutaneous needle biopsy versus video bronchoscopy with endobronchial navigation to the lesion. I believe his lingular lesion is potentially  reachable even though it is close to the periphery of the lung.  The right lower lobe lesion is in a difficult location not to mention its near approximation to the inferior vena cava. We discussed all of these risks to include bleeding, pneumothorax.  Additionally if this is metastatic renal cancer carries a slight higher risk of bleeding just because of the vascular nature of the tumor.  In conjunction he has platelet dysfunction at baseline as he is in end-stage renal disease patient and is also on Coumadin.  He is a very high risk for having bleeding complications related to biopsy and this was complained of the patient today in the office.  Despite all of this I believe the next best steps would be consideration for navigational bronchoscopy and the patient is agreeable to proceed.  We will tentatively plan this for [facility]/25/2020 at [facility].  Patient will need to hold Coumadin 5 days prior this  was discussed with the patient. We will need to arrange dialysis need as well around the holidays as well as prior to the procedure.  I have sent communication out to the patient's cardiologist, nephrologist, surgeon as well as urologist.      Current Outpatient Medications:  .  Accu-Chek FastClix Lancets MISC, Use to check fasting blood sugar and 2 hrs after largest meal., Disp: 100 each, Rfl: 0 .  ACCU-CHEK GUIDE test strip, USE 1 STRIP TO CHECK FASTING BLOOD SUGAR AND 2 HOURS AFTER LARGEST MEAL, Disp: 100 each, Rfl: 3 .  acetaminophen (TYLENOL) 500 MG tablet, Take 1,000 mg by mouth as needed for mild pain or headache. , Disp: , Rfl:  .  allopurinol (ZYLOPRIM) 100 MG tablet, Take 1 tablet (100 mg total) by mouth every evening., Disp: 30 tablet, Rfl: 0 .  amiodarone (PACERONE) 200 MG tablet, Take 0.5 tablets (100 mg total) by mouth daily., Disp: 45 tablet, Rfl: 1 .  aspirin EC 81 MG EC tablet, Take 1 tablet (81 mg total) by mouth daily. (Patient taking differently: Take 81 mg by mouth  every evening.), Disp: 30 tablet, Rfl: 0 .  atorvastatin (LIPITOR) 40 MG tablet, Take 1 tablet (40 mg total) by mouth daily. (Patient taking differently: Take 40 mg by mouth every evening.), Disp: 30 tablet, Rfl: 0 .  AURYXIA 1 GM 210 MG(Fe) tablet, Take 210 mg by mouth in the morning and at bedtime., Disp: , Rfl:  .  blood glucose meter kit and supplies, Dispense based on patient and insurance preference. Use up to four times daily as directed. (FOR ICD-10 E10.9, E11.9)., Disp: 1 each, Rfl: 0 .  diphenhydramine-acetaminophen (TYLENOL PM) 25-500 MG TABS tablet, Take 2 tablets by mouth at bedtime as needed (sleep)., Disp: , Rfl:  .  fluticasone (FLONASE) 50 MCG/ACT nasal spray, Place 1-2 sprays into both nostrils as needed for allergies or rhinitis. , Disp: , Rfl:  .  loratadine (CLARITIN) 10 MG tablet, Take 10 mg by mouth every evening. , Disp: , Rfl:  .  nitroGLYCERIN (NITROSTAT) 0.4 MG SL tablet, Place 1 tablet (0.4 mg total) under the tongue every 5 (five) minutes as needed for chest pain., Disp: 14 tablet, Rfl: 12 .  omeprazole (PRILOSEC OTC) 20 MG tablet, Take 40 mg by mouth daily before breakfast. , Disp: , Rfl:  .  warfarin (COUMADIN) 3 MG tablet, Take 1 to 1.5 tablets by mouth daily as directed by coumadin clinic, Disp: 135 tablet, Rfl: 0  I spent 42 minutes dedicated to the care of this patient on the date of this encounter to include pre-visit review of records, face-to-face time with the patient discussing conditions above, post visit ordering of testing, clinical documentation with the electronic health record, making appropriate referrals as documented, and communicating necessary findings to members of the patients care team.    Garner Nash, DO [facility] 08/12/2020 12:06 PM

## 2020-08-12 NOTE — H&P (View-Only) (Signed)
Synopsis: Referred in October 2020 for lung nodule by Lorrene Reid, PA-C  Subjective:   [name]. GENDER: male DOB: 1956/01/27, MRN: [id number]  Chief Complaint  Patient presents with  . Follow-up    4 month follow up     Patient with a past medical history of atrial fibrillation on Coumadin, OSA on CPAP, diabetes, CKD.  Patient seen today in consultation for lingular lung nodule.  Patient has a past medical history of renal cell carcinoma diagnosed in 2019 status post nephrectomy.  Subsequent scans have been stable.  Did find a lingular lung nodule in January 2020.  Subsequent CT imaging follow-up in May showed enlargement to 9 x 8 mm.  Subsequent imaging in July as well as recently in October 2020 both demonstrated stability of the nodule at 9 x 8 mm.  He is also on dialysis Tuesday Thursday Saturday.  He has a recent fistula failure with a tunneled cath currently in place and plans for AV fistula revision.  Overall very worried and concerned about the lung nodule.  He has been told that it could represent spread of his previous cancer.  Today we reviewed the images in the office.  Otherwise he has no symptoms.  No respiratory symptoms.  Lifelong non-smoker.  Lifelong no alcohol use.  Patient denies hemoptysis fevers chills night sweats weight loss.  OV 08/07/2019: Patient here for follow-up after PET scan imaging.  Lung nodule on PET scan was non-PET avid however there is a lesion found within the pancreas.  Discussed this today in the office.  Patient has no complaints from respiratory standpoint today.  He is recently underwent a new fistula revision with Dr. Kasandra Knudsen.  Going back to see him next week.  Still has his tunneled catheter.  Going to dialysis Tuesday Thursday Saturday.  OV 08/12/2020:Here today for follow-up regarding recent CT chest that was completed on 07/29/2020: Had interval enlargement in the lesion of the neck of the pancreas.  Bilateral slowly  enlarging pulmonary nodules suspicious for metastasis.  Today, overall doing well today.  He was able to get his fistula revised and now on a regular Tuesday Thursdays Saturday schedule for dialysis.  Considerations at this time for work-up for potential kidney transplantation.  Unfortunately recent CT scan of the chest revealed enlarging nodules concerning for malignancy.  From a respiratory standpoint patient has no complaints.   Past Medical History:  Diagnosis Date  . Anemia   . Arthritis    knees  . Atrial fibrillation ([place])   . Bilateral renal cysts 03/23/2018   Noted MRI ABD  . Cancer [facility])    right kidney cancer  . Cancer [facility])    pancreatic  . Cholelithiasis 03/19/2018   noted on CT AB/Pelvis  . CKD (chronic kidney disease), stage IV [facility])    nephrologist-- dr Hollie Salk [name]);  05-01-2018 has not started dialysis, scheduled for AV fistula creation 05-07-2018  . Diabetes mellitus without complication ([place])   . Diverticulosis of colon 04/19/2018   Noted on CT abd/pelvis  . GERD (gastroesophageal reflux disease)   . Hepatic steatosis 03/19/2018   Mild diffuse, noted on CT AB/Pelvis  . History of kidney stones   . History of pulmonary embolus (PE) 03/2008   treated with coumadin for 6 months  . History of sepsis 04/20/2018   per discharge note , probable UTI  . Hypertension   . IBS (irritable bowel syndrome)   . Nocturia   . OSA on  CPAP    uses CPAP nightly  . Pancreatic lesion    0.4cm cystic per CT 07/ 2019  . Peripheral vascular disease ([place])   . Pneumonia    x 1  . Pulmonary nodule    Solid 40m Right Lower Lobe  . Right renal mass 04/10/2018   new dx--  scheduled for nephrectomy 05-16-2018  . S/P ureteral stent placement: 04/16/2018 04/19/2018     Family History  Problem Relation Age of Onset  . Alzheimer's disease Mother   . Alzheimer's disease Father   . Heart disease Father   . Heart attack Father   . Cancer - Other Sister      Past Surgical  History:  Procedure Laterality Date  . AV FISTULA PLACEMENT Left 05/07/2018   Procedure: Creation of Left arm Radiocephalic Fistula;  Surgeon: CMarty Heck MD;  [place]  Service: Vascular;  Laterality: Left;  . AV FISTULA PLACEMENT Left 05/15/2019   Procedure: Creation of Brachiocephalic fistula left arm;  Surgeon: CWaynetta Sandy MD;  [place]  Service: Vascular;  Laterality: Left;  . BASCILIC VEIN TRANSPOSITION Left 07/15/2019   Procedure: BASILIC VEIN TRANSPOSITION SECOND STAGE;  Surgeon: CWaynetta Sandy MD;  [place]  Service: Vascular;  Laterality: Left;  . COLONOSCOPY    . CYSTOSCOPY/RETROGRADE/URETEROSCOPY/STONE EXTRACTION WITH BASKET  x2 last one 1990s approx.  . CYSTOSCOPY/URETEROSCOPY/HOLMIUM LASER/STENT PLACEMENT Left 04/16/2018   Procedure: CYSTOSCOPY/LEFT URETEROSCOPY/LEFT RETROGRADE/STENT PLACEMENT;  Surgeon: BLucas Mallow MD;  [facility];  Service: Urology;  Laterality: Left;  . EUS N/A 05/03/2018   Procedure: UPPER ENDOSCOPIC ULTRASOUND (EUS) RADIAL;  Surgeon: JMilus Banister MD;  [facility];  Service: Gastroenterology;  Laterality: N/A;  . EUS N/A 05/03/2018   Procedure: UPPER ENDOSCOPIC ULTRASOUND (EUS) LINEAR;  Surgeon: JMilus Banister MD;  [facility];  Service: Gastroenterology;  Laterality: N/A;  . EXTRACORPOREAL SHOCK WAVE LITHOTRIPSY  x3  last one 2004 approx.  .Marland KitchenFINE NEEDLE ASPIRATION N/A 05/03/2018   Procedure: FINE NEEDLE ASPIRATION (FNA) LINEAR;  Surgeon: JMilus Banister MD;  [facility];  Service: Gastroenterology;  Laterality: N/A;  . LAPAROSCOPIC NEPHRECTOMY, HAND ASSISTED Right 05/16/2018   Procedure: HAND ASSISTED LAPAROSCOPIC RIGHT NEPHRECTOMY;  Surgeon: BLucas Mallow MD;  [facility];  Service: Urology;  Laterality: Right;  . left index finger attachment  1980s   3-4 surgeries  . TOE SURGERY  1980s   in beween 2nd and 3rd toes cyst removed  . UPPER GI  ENDOSCOPY      Social History   Socioeconomic History  . Marital status: Married    Spouse name: Not on file  . Number of children: Not on file  . Years of education: Not on file  . Highest education level: Not on file  Occupational History  . Occupation: disabled  Tobacco Use  . Smoking status: Never Smoker  . Smokeless tobacco: Never Used  Vaping Use  . Vaping Use: Never used  Substance and Sexual Activity  . Alcohol use: Never  . Drug use: Never  . Sexual activity: Not on file  Other Topics Concern  . Not on file  Social History Narrative  . Not on file   Social Determinants of Health   Financial Resource Strain: Not on file  Food Insecurity: Not on file  Transportation Needs: Not on file  Physical Activity: Not on file  Stress: Not on file  Social Connections: Not on file  Intimate Partner Violence: Not on file     Allergies  Allergen Reactions  . Penicillins Rash and Other (See Comments)    Has patient had a PCN reaction causing immediate rash, facial/tongue/throat swelling, SOB or lightheadedness with hypotension: yes Has patient had a PCN reaction causing severe rash involving mucus membranes or skin necrosis: no Has patient had a PCN reaction that required hospitalization: no Has patient had a PCN reaction occurring within the last 10 years: no If all of the above answers are "NO", then may proceed with Cephalosporin use.      Outpatient Medications Prior to Visit  Medication Sig Dispense Refill  . Accu-Chek FastClix Lancets MISC Use to check fasting blood sugar and 2 hrs after largest meal. 100 each 0  . ACCU-CHEK GUIDE test strip USE 1 STRIP TO CHECK FASTING BLOOD SUGAR AND 2 HOURS AFTER LARGEST MEAL 100 each 3  . acetaminophen (TYLENOL) 500 MG tablet Take 1,000 mg by mouth as needed for mild pain or headache.     . allopurinol (ZYLOPRIM) 100 MG tablet Take 1 tablet (100 mg total) by mouth every evening. 30 tablet 0  . amiodarone (PACERONE) 200 MG  tablet Take 0.5 tablets (100 mg total) by mouth daily. 45 tablet 1  . aspirin EC 81 MG EC tablet Take 1 tablet (81 mg total) by mouth daily. (Patient taking differently: Take 81 mg by mouth every evening.) 30 tablet 0  . atorvastatin (LIPITOR) 40 MG tablet Take 1 tablet (40 mg total) by mouth daily. (Patient taking differently: Take 40 mg by mouth every evening.) 30 tablet 0  . AURYXIA 1 GM 210 MG(Fe) tablet Take 210 mg by mouth in the morning and at bedtime.    . blood glucose meter kit and supplies Dispense based on patient and insurance preference. Use up to four times daily as directed. (FOR ICD-10 E10.9, E11.9). 1 each 0  . diphenhydramine-acetaminophen (TYLENOL PM) 25-500 MG TABS tablet Take 2 tablets by mouth at bedtime as needed (sleep).    . fluticasone (FLONASE) 50 MCG/ACT nasal spray Place 1-2 sprays into both nostrils as needed for allergies or rhinitis.     Marland Kitchen loratadine (CLARITIN) 10 MG tablet Take 10 mg by mouth every evening.     . nitroGLYCERIN (NITROSTAT) 0.4 MG SL tablet Place 1 tablet (0.4 mg total) under the tongue every 5 (five) minutes as needed for chest pain. 14 tablet 12  . omeprazole (PRILOSEC OTC) 20 MG tablet Take 40 mg by mouth daily before breakfast.     . warfarin (COUMADIN) 3 MG tablet Take 1 to 1.5 tablets by mouth daily as directed by coumadin clinic 135 tablet 0   No facility-administered medications prior to visit.    Review of Systems  Constitutional: Negative for chills, fever, malaise/fatigue and weight loss.  HENT: Negative for hearing loss, sore throat and tinnitus.   Eyes: Negative for blurred vision and double vision.  Respiratory: Negative for cough, hemoptysis, sputum production, shortness of breath, wheezing and stridor.   Cardiovascular: Negative for chest pain, palpitations, orthopnea, leg swelling and PND.  Gastrointestinal: Negative for abdominal pain, constipation, diarrhea, heartburn, nausea and vomiting.  Genitourinary: Negative for dysuria,  hematuria and urgency.  Musculoskeletal: Negative for joint pain and myalgias.  Skin: Negative for itching and rash.  Neurological: Negative for dizziness, tingling, weakness and headaches.  Endo/Heme/Allergies: Negative for environmental allergies. Does not bruise/bleed easily.  Psychiatric/Behavioral: Negative for depression. The patient is not nervous/anxious and does not have insomnia.  All other systems reviewed and are negative.    Objective:  Physical Exam Vitals reviewed.  Constitutional:      General: He is not in acute distress.    Appearance: He is well-developed and well-nourished. He is obese.  HENT:     Head: Normocephalic and atraumatic.     Mouth/Throat:     Mouth: Oropharynx is clear and moist.  Eyes:     General: No scleral icterus.    Conjunctiva/sclera: Conjunctivae normal.     Pupils: Pupils are equal, round, and reactive to light.  Neck:     Vascular: No JVD.     Trachea: No tracheal deviation.  Cardiovascular:     Rate and Rhythm: Normal rate and regular rhythm.     Pulses: Intact distal pulses.     Heart sounds: Normal heart sounds. No murmur heard.   Pulmonary:     Effort: Pulmonary effort is normal. No tachypnea, accessory muscle usage or respiratory distress.     Breath sounds: Normal breath sounds. No stridor. No wheezing, rhonchi or rales.  Abdominal:     General: Bowel sounds are normal. There is no distension.     Palpations: Abdomen is soft.     Tenderness: There is no abdominal tenderness.  Musculoskeletal:        General: No tenderness or edema.     Cervical back: Neck supple.  Lymphadenopathy:     Cervical: No cervical adenopathy.  Skin:    General: Skin is warm and dry.     Capillary Refill: Capillary refill takes less than 2 seconds.     Findings: No rash.  Neurological:     Mental Status: He is alert and oriented to person, place, and time.  Psychiatric:        Mood and Affect: Mood and affect normal.        Behavior:  Behavior normal.      Vitals:   08/12/20 1140  BP: 126/60  Pulse: (!) 112  Temp: (!) 97.1 F (36.2 C)  TempSrc: Tympanic  SpO2: 98%  Weight: 285 lb 2 oz (129.3 kg)  Height: '6\' 1"'  (1.854 m)   98% on RA BMI Readings from Last 3 Encounters:  08/12/20 37.62 kg/m  08/03/20 38.14 kg/m  07/15/20 37.63 kg/m   Wt Readings from Last 3 Encounters:  08/12/20 285 lb 2 oz (129.3 kg)  08/03/20 289 lb 1.6 oz (131.1 kg)  07/15/20 285 lb 3.2 oz (129.4 kg)     CBC    Component Value Date/Time   WBC 11.2 (H) 08/05/2020 0852   WBC 7.6 04/23/2019 1330   RBC 3.55 (L) 08/05/2020 0852   RBC 4.58 04/23/2019 1330   HGB 11.2 (L) 08/05/2020 0852   HCT 32.6 (L) 08/05/2020 0852   PLT 232 08/05/2020 0852   MCV 92 08/05/2020 0852   MCH 31.5 08/05/2020 0852   MCH 27.3 04/23/2019 1330   MCHC 34.4 08/05/2020 0852   MCHC 33.2 04/23/2019 1330   RDW 13.8 08/05/2020 0852   LYMPHSABS 2.4 08/05/2020 0852   MONOABS 1.0 12/19/2018 0048   EOSABS 0.3 08/05/2020 0852   BASOSABS 0.1 08/05/2020 0852     Chest Imaging: CT chest imaging from January 2020, May 2020, July 20 18 June 2019. All of the images reviewed today in succession with the patient. Imaging originally showed small lung nodule in the lingula in January which showed enlargement progression it may up to 9 mm x 8 mm. He has documented stability from that  point until October, last week which revealed a stable 9 mm x 8 mm lung nodule. Also has a another small nodule within the azygous recess of the right lower lobe that is 4 x 5 mm. The patient's images have been independently reviewed by me.    Nuclear medicine pet imaging 55/10/7480: No hypermetabolic activity within the lingular lung nodule.  However there is intense activity within the head of the pancreas associated with a small ovoid intermediate sized lesion concerning for a primary pancreatic malignancy. The patient's images have been independently reviewed by me.   December  2021: CT chest: Interval enlargement of lesion in the neck of the pancreas has been followed before.  Bilateral pulmonary nodules with increase in size as well concerning for malignancy.  Pulmonary Functions Testing Results: No flowsheet data found.  FeNO: None   Pathology: None   Echocardiogram: None   Heart Catheterization: None     Assessment & Plan:     ICD-10-CM   1. Lung nodules  R91.8 Ambulatory referral to Pulmonology    Procedural/ Surgical Case Request: VIDEO BRONCHOSCOPY WITH ENDOBRONCHIAL NAVIGATION  2. H/O renal cell cancer  Z85.528   3. History of renal cell cancer  Z85.528   4. Pancreatic mass  K86.89   5. ESRD (end stage renal disease) ([place])  N18.6   6. Anticoagulated on Coumadin  Z79.01     Discussion:  64 year old gentleman with here today for follow-up of his left upper lobe lingular lung nodule as well as a right lower lobe lung nodule.  He has a history of renal cell carcinoma status post nephrectomy in 2019.  He has had subsequent serial images for follow-up of these nodules.  Unfortunately they have shown progression and enlargement.  At this point the lingular nodule is now 13 mm in largest cross-section the lower lobe nodule in the right is 9 mm in largest cross-section.  Also has a pancreatic lesion that has slowly been enlarging.  He follows with Dr. Barry Dienes from [place] regarding this.  Plan: Today in the office we discussed the risk benefits and alternatives of proceeding with tissue diagnosis of the enlarging lung nodules. Since he is considering transplantation for the kidney I believe it is imperative that we help figure out whether or not the enlarging nodules are malignant. The CT evidence of its short-term growth between August and now would suggest concern for malignancy.  We discussed options to include percutaneous needle biopsy versus video bronchoscopy with endobronchial navigation to the lesion. I believe his lingular lesion is potentially  reachable even though it is close to the periphery of the lung.  The right lower lobe lesion is in a difficult location not to mention its near approximation to the inferior vena cava. We discussed all of these risks to include bleeding, pneumothorax.  Additionally if this is metastatic renal cancer carries a slight higher risk of bleeding just because of the vascular nature of the tumor.  In conjunction he has platelet dysfunction at baseline as he is in end-stage renal disease patient and is also on Coumadin.  He is a very high risk for having bleeding complications related to biopsy and this was complained of the patient today in the office.  Despite all of this I believe the next best steps would be consideration for navigational bronchoscopy and the patient is agreeable to proceed.  We will tentatively plan this for 08/25/2020 at [facility] endoscopy.  Patient will need to hold Coumadin 5 days prior this  was discussed with the patient. We will need to arrange dialysis need as well around the holidays as well as prior to the procedure.  I have sent communication out to the patient's cardiologist, nephrologist, surgeon as well as urologist.      Current Outpatient Medications:  .  Accu-Chek FastClix Lancets MISC, Use to check fasting blood sugar and 2 hrs after largest meal., Disp: 100 each, Rfl: 0 .  ACCU-CHEK GUIDE test strip, USE 1 STRIP TO CHECK FASTING BLOOD SUGAR AND 2 HOURS AFTER LARGEST MEAL, Disp: 100 each, Rfl: 3 .  acetaminophen (TYLENOL) 500 MG tablet, Take 1,000 mg by mouth as needed for mild pain or headache. , Disp: , Rfl:  .  allopurinol (ZYLOPRIM) 100 MG tablet, Take 1 tablet (100 mg total) by mouth every evening., Disp: 30 tablet, Rfl: 0 .  amiodarone (PACERONE) 200 MG tablet, Take 0.5 tablets (100 mg total) by mouth daily., Disp: 45 tablet, Rfl: 1 .  aspirin EC 81 MG EC tablet, Take 1 tablet (81 mg total) by mouth daily. (Patient taking differently: Take 81 mg by mouth  every evening.), Disp: 30 tablet, Rfl: 0 .  atorvastatin (LIPITOR) 40 MG tablet, Take 1 tablet (40 mg total) by mouth daily. (Patient taking differently: Take 40 mg by mouth every evening.), Disp: 30 tablet, Rfl: 0 .  AURYXIA 1 GM 210 MG(Fe) tablet, Take 210 mg by mouth in the morning and at bedtime., Disp: , Rfl:  .  blood glucose meter kit and supplies, Dispense based on patient and insurance preference. Use up to four times daily as directed. (FOR ICD-10 E10.9, E11.9)., Disp: 1 each, Rfl: 0 .  diphenhydramine-acetaminophen (TYLENOL PM) 25-500 MG TABS tablet, Take 2 tablets by mouth at bedtime as needed (sleep)., Disp: , Rfl:  .  fluticasone (FLONASE) 50 MCG/ACT nasal spray, Place 1-2 sprays into both nostrils as needed for allergies or rhinitis. , Disp: , Rfl:  .  loratadine (CLARITIN) 10 MG tablet, Take 10 mg by mouth every evening. , Disp: , Rfl:  .  nitroGLYCERIN (NITROSTAT) 0.4 MG SL tablet, Place 1 tablet (0.4 mg total) under the tongue every 5 (five) minutes as needed for chest pain., Disp: 14 tablet, Rfl: 12 .  omeprazole (PRILOSEC OTC) 20 MG tablet, Take 40 mg by mouth daily before breakfast. , Disp: , Rfl:  .  warfarin (COUMADIN) 3 MG tablet, Take 1 to 1.5 tablets by mouth daily as directed by coumadin clinic, Disp: 135 tablet, Rfl: 0  I spent 42 minutes dedicated to the care of this patient on the date of this encounter to include pre-visit review of records, face-to-face time with the patient discussing conditions above, post visit ordering of testing, clinical documentation with the electronic health record, making appropriate referrals as documented, and communicating necessary findings to members of the patients care team.    Garner Nash, DO [facility] 08/12/2020 12:06 PM

## 2020-08-12 NOTE — Patient Instructions (Addendum)
Thank you for visiting Dr. Valeta Harms at [facility]. Today we recommend the following:  Orders Placed This Encounter  Procedures  . Procedural/ Surgical Case Request: VIDEO BRONCHOSCOPY WITH ENDOBRONCHIAL NAVIGATION  . Ambulatory referral to Pulmonology   Stop coumadin on 08/19/2020 Please coordinate dialysis need with dialysis center.  Nothing to eat the night before  Tentative date is 08/25/2020 Expect a call from pre-op services.   Return in about 4 weeks (around 09/09/2020) for with, Eric Form, NP, or Dr. Valeta Harms.    Please do your part to reduce the spread of COVID-19.

## 2020-08-13 ENCOUNTER — Other Ambulatory Visit: Payer: Self-pay | Admitting: Internal Medicine

## 2020-08-13 IMAGING — DX CHEST - 2 VIEW
2 series · 2 of 2 positions shown · non-contrast
Comparison: One-view chest x-ray 12/19/2018

CLINICAL DATA: Anterior chest pain. Pain woke the patient from
sleep. Pain is worse with a deep breath. Initial encounter.

EXAM:
CHEST - 2 VIEW

[chest pa]
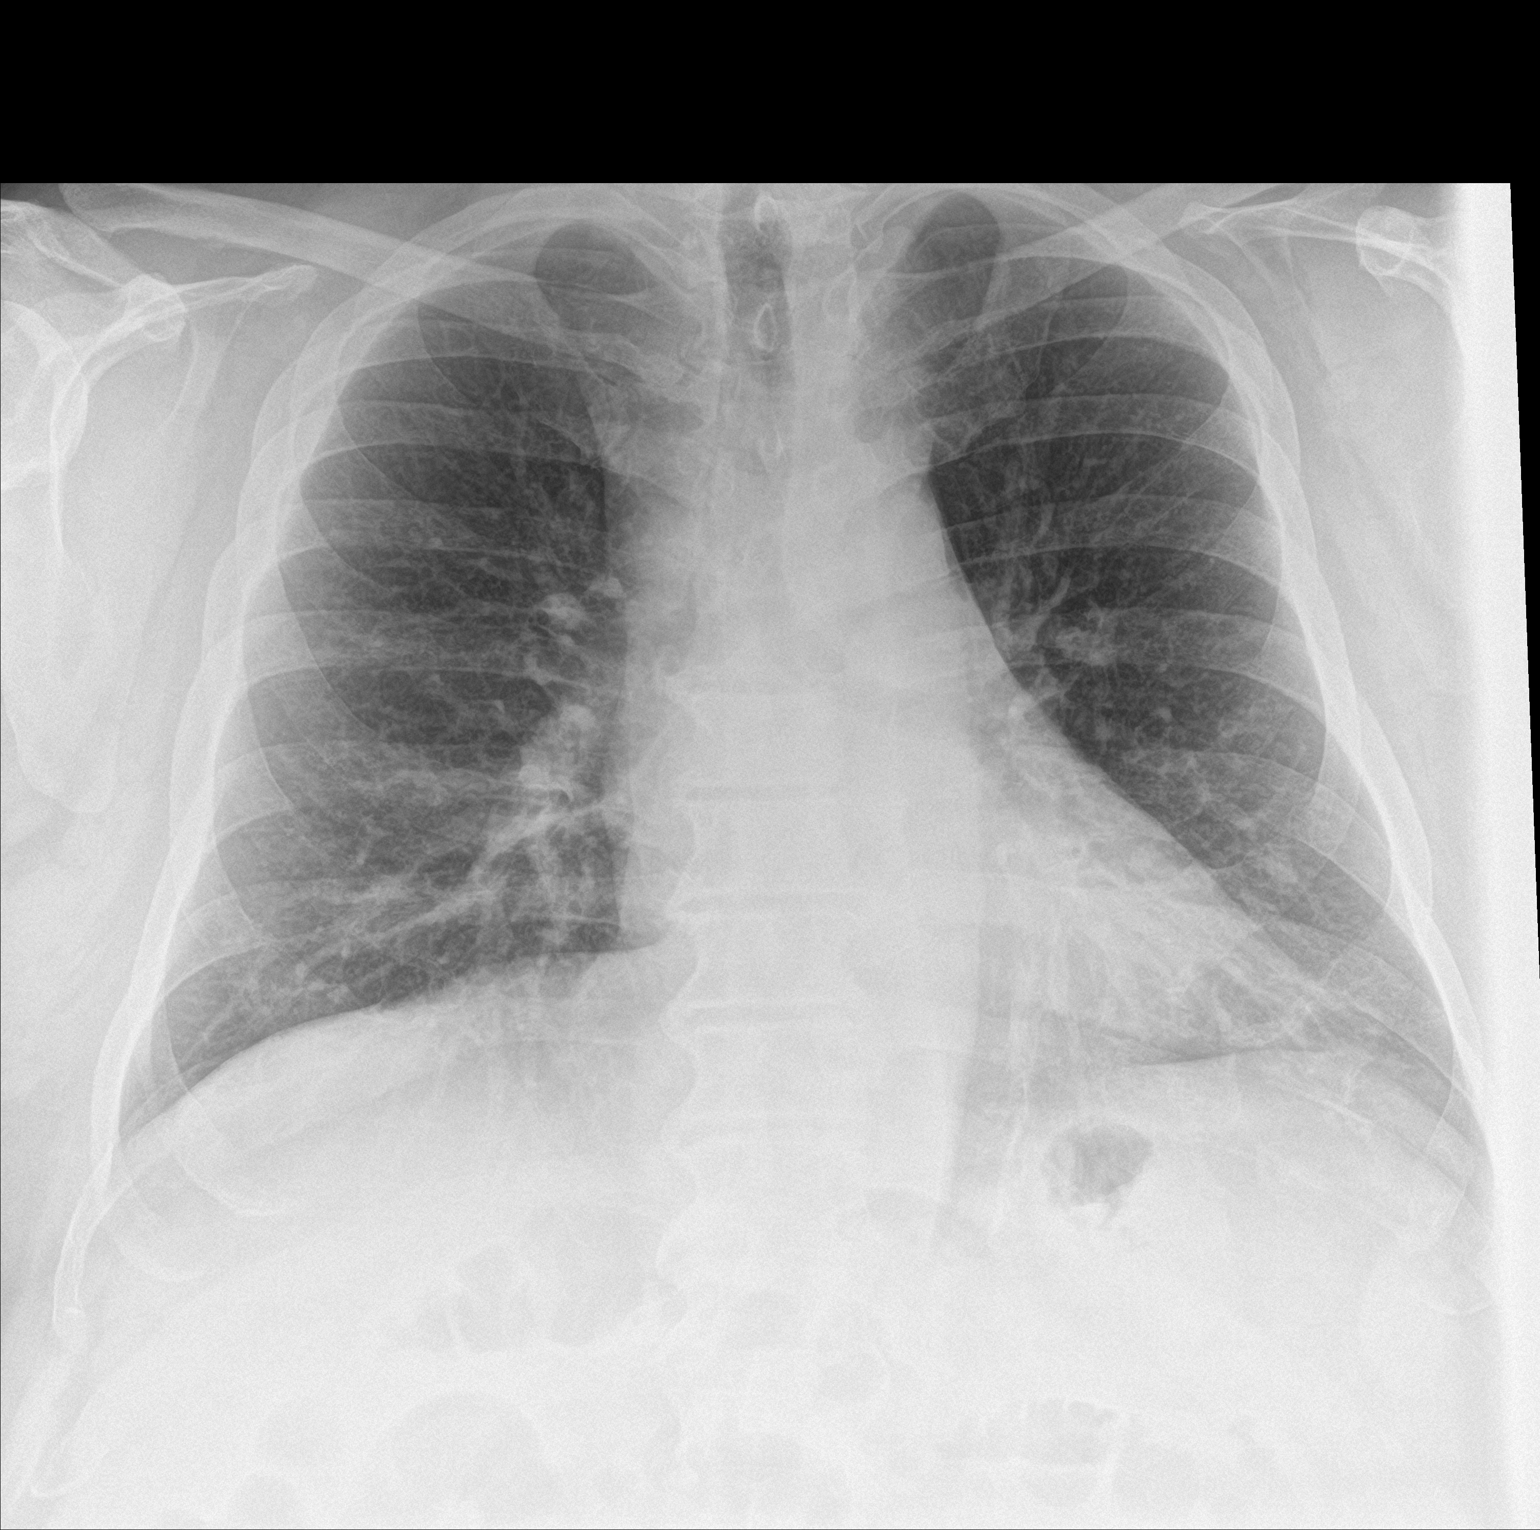

[chest lat]
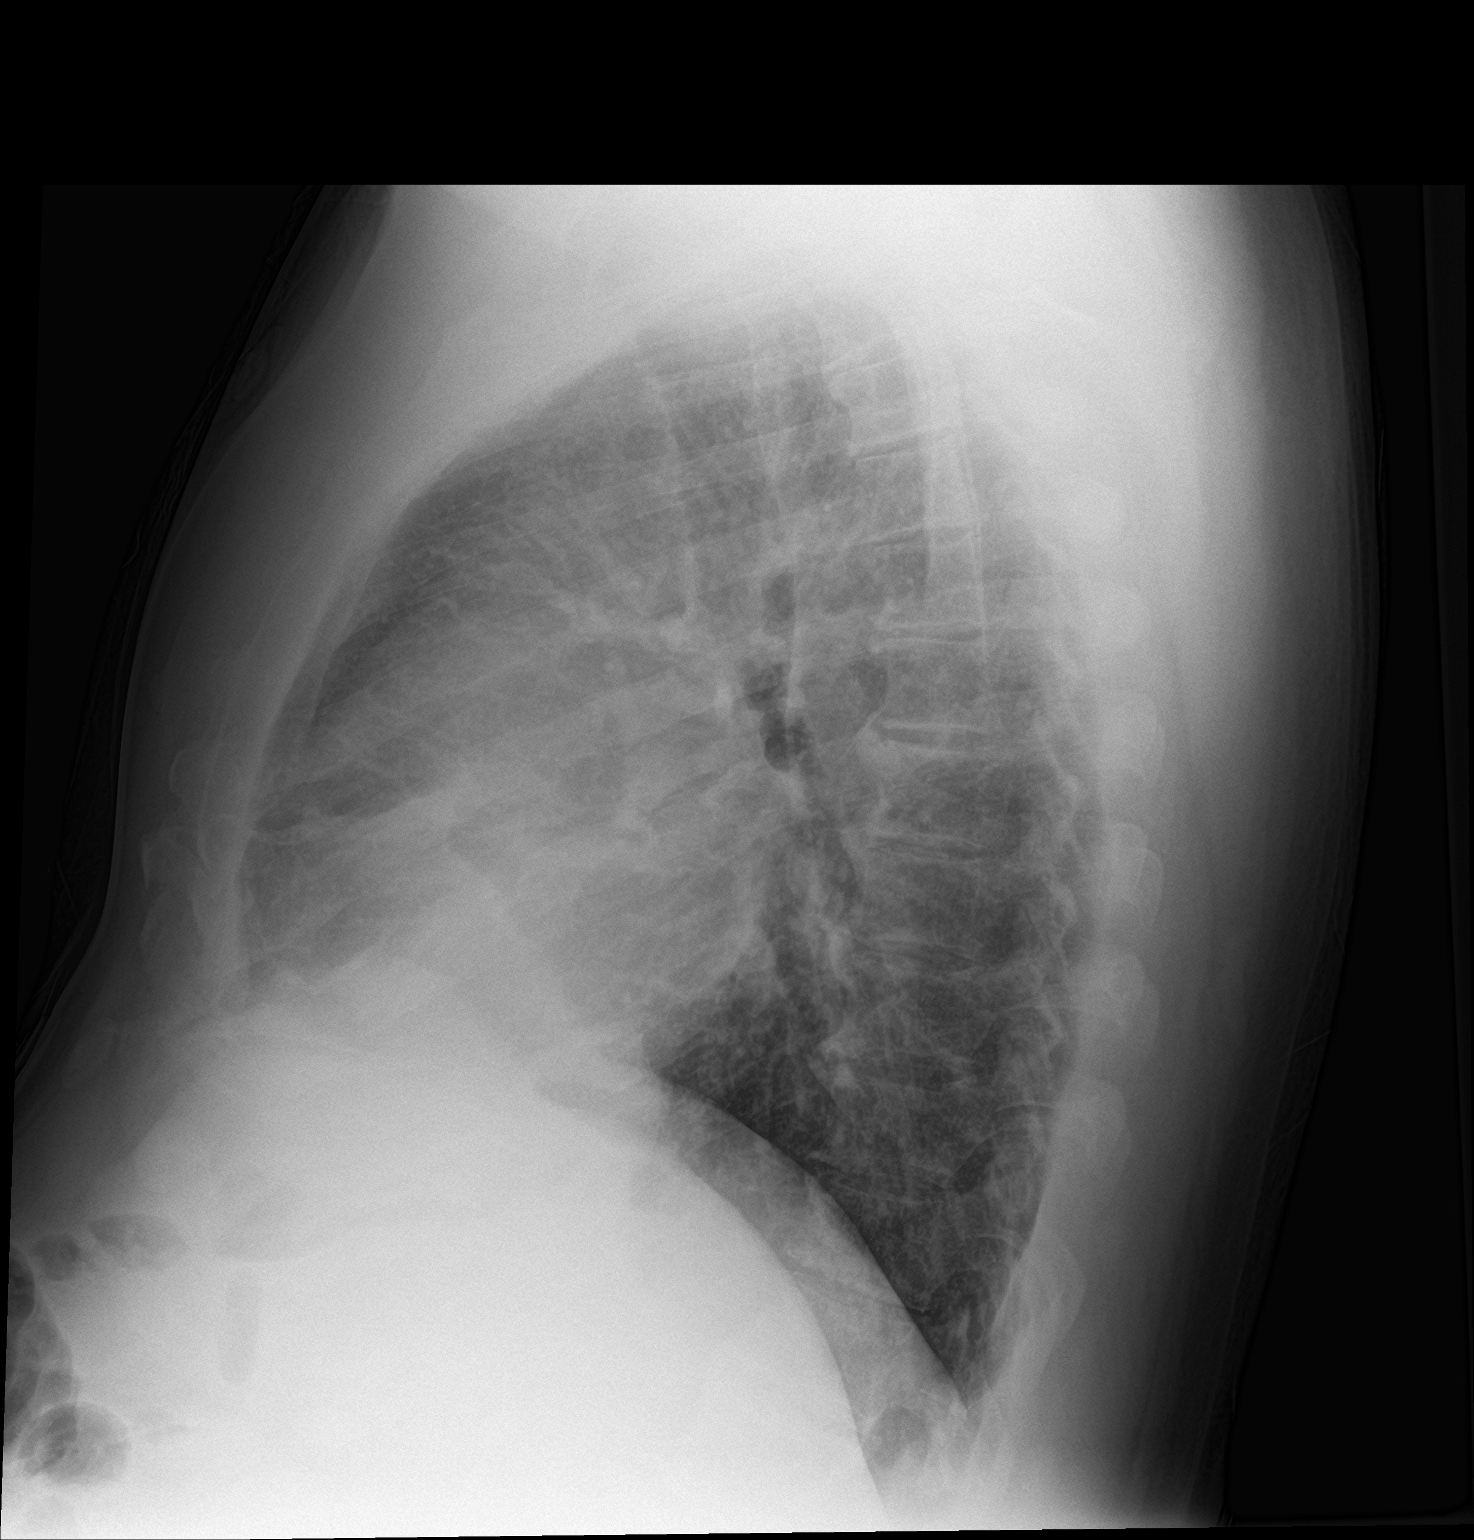

[2 of 2 positions shown; findings below may reference images not displayed]

FINDINGS: The heart size is exaggerated by low lung volumes. Mild bibasilar
airspace opacities likely reflect atelectasis. No other significant
consolidation is present. There is no edema or effusion. The
visualized soft tissues and bony thorax are unremarkable.
IMPRESSION: 1. Mild bibasilar opacities in the setting of low lung volumes
likely reflect atelectasis.
2. Borderline cardiomegaly without failure.  If

## 2020-08-18 ENCOUNTER — Telehealth: Payer: Self-pay | Admitting: Internal Medicine

## 2020-08-18 MED ORDER — WARFARIN SODIUM 3 MG PO TABS: ORAL_TABLET | ORAL | 2 refills | Status: DC

## 2020-08-18 NOTE — Telephone Encounter (Signed)
Forwarded to pharm md to review

## 2020-08-18 NOTE — Telephone Encounter (Signed)
Refill sent in

## 2020-08-18 NOTE — Telephone Encounter (Signed)
*  STAT* If patient is at the pharmacy, call can be transferred to refill team.   1. Which medications need to be refilled? (please list name of each medication and dose if known) warfarin (COUMADIN) 3 MG tablet  2. Which pharmacy/location (including street and city if local pharmacy) is medication to be sent to? [facility] [place], [place]  3. Do they need a 30 day or 90 day supply? 30 day supply

## 2020-08-19 ENCOUNTER — Telehealth: Payer: Self-pay | Admitting: *Deleted

## 2020-08-19 NOTE — Telephone Encounter (Signed)
I have called and spoke with pt and his wife.  They are aware that he will stop the coumadin 12/23 and will hold until after the [facility] on 12/28/with BI.  He did get the dialysis set up for Wednesday.

## 2020-08-20 ENCOUNTER — Encounter (HOSPITAL_COMMUNITY): Payer: Self-pay | Admitting: Pulmonary Disease

## 2020-08-20 ENCOUNTER — Telehealth: Payer: Self-pay | Admitting: Internal Medicine

## 2020-08-20 NOTE — Telephone Encounter (Signed)
Yes, Ok to stay on 81mg  ASA daily. Thanks BLI

## 2020-08-20 NOTE — Telephone Encounter (Signed)
Returned the call to the patient. She stated that the patient has a bronchoscopy scheduled for 08/25/20. The pulmonologist had advised the patient to hold Coumadin for 5 days prior (note in epic). The patient's wife was calling because she wanted to know if the patient should hold the aspirin as well. She was advised to call the pulmonology office but stated that they are currently closed until next week.   Message has been routed to the cardiologist and pulmonologist

## 2020-08-20 NOTE — Telephone Encounter (Signed)
The patient's wife has been made aware and verbalized her understanding.

## 2020-08-20 NOTE — Progress Notes (Addendum)
PCP:  Lorrene Reid, PA-C Cardiologist:  Dr. Beryle Beams  EKG:  03/04/20 CXR:  03/05/20 ECHO:  02/26/19 Stress Test:  Denies Cardiac Cath:  DEnies  Pt no longer on insulin  ASA:  Patient to call for instructions Blood Thinners:  Last dose Coumadin 08/19/20  OSA/CPAP:  Yes, wears nightly  Covid test 08/24/20  Anesthesia Review:  No   Patient denies shortness of breath, fever, cough, and chest pain at PAT appointment.  Patient verbalized understanding of instructions provided today at the PAT appointment.  Patient asked to review instructions at home and day of surgery.

## 2020-08-20 NOTE — Telephone Encounter (Signed)
New message:     Patient wife calling because she has some questions concerning patient  Medications. Please call patient wife.

## 2020-08-24 ENCOUNTER — Other Ambulatory Visit (HOSPITAL_COMMUNITY)
Admission: RE | Admit: 2020-08-24 | Discharge: 2020-08-24 | Disposition: A | Discharge status: Home / Self Care | Payer: PRIVATE HEALTH INSURANCE | Source: Ambulatory Visit | Attending: Pulmonary Disease | Admitting: Pulmonary Disease

## 2020-08-24 DIAGNOSIS — Z20822 Contact with and (suspected) exposure to covid-19: Secondary | ICD-10-CM | POA: Insufficient documentation

## 2020-08-24 DIAGNOSIS — Z01812 Encounter for preprocedural laboratory examination: Secondary | ICD-10-CM | POA: Insufficient documentation

## 2020-08-24 LAB — SARS CORONAVIRUS 2 (TAT 6-24 HRS): SARS Coronavirus 2: NEGATIVE

## 2020-08-25 ENCOUNTER — Ambulatory Visit (HOSPITAL_COMMUNITY): Payer: PRIVATE HEALTH INSURANCE

## 2020-08-25 ENCOUNTER — Ambulatory Visit (HOSPITAL_COMMUNITY)
Admission: RE | Admit: 2020-08-25 | Discharge: 2020-08-25 | Disposition: A | Discharge status: Home / Self Care | Payer: PRIVATE HEALTH INSURANCE | Attending: Pulmonary Disease | Admitting: Pulmonary Disease

## 2020-08-25 ENCOUNTER — Other Ambulatory Visit (HOSPITAL_COMMUNITY): Payer: Self-pay | Admitting: Physician Assistant

## 2020-08-25 ENCOUNTER — Ambulatory Visit (HOSPITAL_COMMUNITY): Payer: PRIVATE HEALTH INSURANCE | Admitting: Certified Registered Nurse Anesthetist

## 2020-08-25 ENCOUNTER — Encounter (HOSPITAL_COMMUNITY): Payer: Self-pay | Admitting: Pulmonary Disease

## 2020-08-25 ENCOUNTER — Encounter (HOSPITAL_COMMUNITY)
Admission: RE | Disposition: A | Discharge status: Home / Self Care | Payer: Self-pay | Source: Home / Self Care | Attending: Pulmonary Disease

## 2020-08-25 ENCOUNTER — Other Ambulatory Visit: Payer: Self-pay

## 2020-08-25 DIAGNOSIS — N186 End stage renal disease: Secondary | ICD-10-CM | POA: Diagnosis not present

## 2020-08-25 DIAGNOSIS — Z86711 Personal history of pulmonary embolism: Secondary | ICD-10-CM | POA: Insufficient documentation

## 2020-08-25 DIAGNOSIS — E1122 Type 2 diabetes mellitus with diabetic chronic kidney disease: Secondary | ICD-10-CM | POA: Diagnosis not present

## 2020-08-25 DIAGNOSIS — Z8507 Personal history of malignant neoplasm of pancreas: Secondary | ICD-10-CM | POA: Diagnosis not present

## 2020-08-25 DIAGNOSIS — E1151 Type 2 diabetes mellitus with diabetic peripheral angiopathy without gangrene: Secondary | ICD-10-CM | POA: Insufficient documentation

## 2020-08-25 DIAGNOSIS — Z880 Allergy status to penicillin: Secondary | ICD-10-CM | POA: Diagnosis not present

## 2020-08-25 DIAGNOSIS — R918 Other nonspecific abnormal finding of lung field: Secondary | ICD-10-CM | POA: Diagnosis not present

## 2020-08-25 DIAGNOSIS — R911 Solitary pulmonary nodule: Secondary | ICD-10-CM | POA: Insufficient documentation

## 2020-08-25 DIAGNOSIS — Z85528 Personal history of other malignant neoplasm of kidney: Secondary | ICD-10-CM | POA: Insufficient documentation

## 2020-08-25 DIAGNOSIS — Z809 Family history of malignant neoplasm, unspecified: Secondary | ICD-10-CM | POA: Diagnosis not present

## 2020-08-25 DIAGNOSIS — Z8249 Family history of ischemic heart disease and other diseases of the circulatory system: Secondary | ICD-10-CM | POA: Insufficient documentation

## 2020-08-25 DIAGNOSIS — Z79899 Other long term (current) drug therapy: Secondary | ICD-10-CM | POA: Diagnosis not present

## 2020-08-25 DIAGNOSIS — Z905 Acquired absence of kidney: Secondary | ICD-10-CM | POA: Insufficient documentation

## 2020-08-25 DIAGNOSIS — I120 Hypertensive chronic kidney disease with stage 5 chronic kidney disease or end stage renal disease: Secondary | ICD-10-CM | POA: Diagnosis not present

## 2020-08-25 DIAGNOSIS — Z7901 Long term (current) use of anticoagulants: Secondary | ICD-10-CM | POA: Insufficient documentation

## 2020-08-25 DIAGNOSIS — I4891 Unspecified atrial fibrillation: Secondary | ICD-10-CM | POA: Diagnosis not present

## 2020-08-25 DIAGNOSIS — J984 Other disorders of lung: Secondary | ICD-10-CM | POA: Diagnosis present

## 2020-08-25 DIAGNOSIS — Z820 Family history of epilepsy and other diseases of the nervous system: Secondary | ICD-10-CM | POA: Diagnosis not present

## 2020-08-25 DIAGNOSIS — Z9889 Other specified postprocedural states: Secondary | ICD-10-CM

## 2020-08-25 HISTORY — PX: BRONCHIAL NEEDLE ASPIRATION BIOPSY: SHX5106

## 2020-08-25 HISTORY — PX: BRONCHIAL BRUSHINGS: SHX5108

## 2020-08-25 HISTORY — PX: VIDEO BRONCHOSCOPY WITH ENDOBRONCHIAL NAVIGATION: SHX6175

## 2020-08-25 HISTORY — PX: BRONCHIAL WASHINGS: SHX5105

## 2020-08-25 LAB — GLUCOSE, CAPILLARY
Glucose-Capillary: 90 mg/dL (ref 70–99)
Glucose-Capillary: 78 mg/dL (ref 70–99)

## 2020-08-25 LAB — POCT I-STAT, CHEM 8
BUN: 92 mg/dL — ABNORMAL HIGH (ref 8–23)
Calcium, Ion: 1.05 mmol/L — ABNORMAL LOW (ref 1.15–1.40)
Chloride: 101 mmol/L (ref 98–111)
Creatinine, Ser: 9.8 mg/dL — ABNORMAL HIGH (ref 0.61–1.24)
Glucose, Bld: 87 mg/dL (ref 70–99)
HCT: 31 % — ABNORMAL LOW (ref 39.0–52.0)
Hemoglobin: 10.5 g/dL — ABNORMAL LOW (ref 13.0–17.0)
Potassium: 4 mmol/L (ref 3.5–5.1)
Sodium: 139 mmol/L (ref 135–145)
TCO2: 24 mmol/L (ref 22–32)

## 2020-08-25 LAB — PROTIME-INR
INR: 1.1 (ref 0.8–1.2)
Prothrombin Time: 13.7 seconds (ref 11.4–15.2)

## 2020-08-25 SURGERY — VIDEO BRONCHOSCOPY WITH ENDOBRONCHIAL NAVIGATION
Anesthesia: General | Laterality: Bilateral

## 2020-08-25 MED ORDER — LIDOCAINE 2% (20 MG/ML) 5 ML SYRINGE
INTRAMUSCULAR | Status: DC | PRN
  Administered 2020-08-25: 60 mg via INTRAVENOUS

## 2020-08-25 MED ORDER — PROPOFOL 500 MG/50ML IV EMUL
INTRAVENOUS | Status: DC | PRN
  Administered 2020-08-25: 50 ug/kg/min via INTRAVENOUS

## 2020-08-25 MED ORDER — PHENYLEPHRINE HCL-NACL 10-0.9 MG/250ML-% IV SOLN
INTRAVENOUS | Status: DC | PRN
  Administered 2020-08-25: 30 ug/min via INTRAVENOUS

## 2020-08-25 MED ORDER — SUGAMMADEX SODIUM 200 MG/2ML IV SOLN
INTRAVENOUS | Status: DC | PRN
  Administered 2020-08-25: 300 mg via INTRAVENOUS

## 2020-08-25 MED ORDER — ROCURONIUM BROMIDE 10 MG/ML (PF) SYRINGE
PREFILLED_SYRINGE | INTRAVENOUS | Status: DC | PRN
  Administered 2020-08-25: 70 mg via INTRAVENOUS

## 2020-08-25 MED ORDER — FENTANYL CITRATE (PF) 250 MCG/5ML IJ SOLN
INTRAMUSCULAR | Status: DC | PRN
  Administered 2020-08-25: 100 ug via INTRAVENOUS

## 2020-08-25 MED ORDER — ALBUMIN HUMAN 5 % IV SOLN: INTRAVENOUS | Status: DC | PRN

## 2020-08-25 MED ORDER — SODIUM CHLORIDE 0.9 % IV SOLN: INTRAVENOUS | Status: DC

## 2020-08-25 MED ORDER — PROPOFOL 10 MG/ML IV BOLUS
INTRAVENOUS | Status: DC | PRN
  Administered 2020-08-25: 30 mg via INTRAVENOUS
  Administered 2020-08-25: 120 mg via INTRAVENOUS

## 2020-08-25 MED ORDER — ONDANSETRON HCL 4 MG/2ML IJ SOLN
INTRAMUSCULAR | Status: DC | PRN
  Administered 2020-08-25: 4 mg via INTRAVENOUS

## 2020-08-25 MED ORDER — SUCCINYLCHOLINE CHLORIDE 200 MG/10ML IV SOSY
PREFILLED_SYRINGE | INTRAVENOUS | Status: DC | PRN
  Administered 2020-08-25: 120 mg via INTRAVENOUS

## 2020-08-25 MED ORDER — PHENYLEPHRINE 40 MCG/ML (10ML) SYRINGE FOR IV PUSH (FOR BLOOD PRESSURE SUPPORT)
PREFILLED_SYRINGE | INTRAVENOUS | Status: DC | PRN
  Administered 2020-08-25 (×2): 160 ug via INTRAVENOUS
  Administered 2020-08-25: 80 ug via INTRAVENOUS

## 2020-08-25 MED ORDER — DEXAMETHASONE SODIUM PHOSPHATE 10 MG/ML IJ SOLN
INTRAMUSCULAR | Status: DC | PRN
  Administered 2020-08-25: 10 mg via INTRAVENOUS

## 2020-08-25 SURGICAL SUPPLY — 46 items
ADAPTER BRONCH F/PENTAX (ADAPTER) ×4 IMPLANT
ADAPTER VALVE BIOPSY EBUS (MISCELLANEOUS) IMPLANT
ADPR BSCP EDG PNTX (ADAPTER) ×2
ADPTR VALVE BIOPSY EBUS (MISCELLANEOUS)
BRUSH CYTOL CELLEBRITY 1.5X140 (MISCELLANEOUS) ×4 IMPLANT
BRUSH SUPERTRAX BIOPSY (INSTRUMENTS) IMPLANT
BRUSH SUPERTRAX NDL-TIP CYTO (INSTRUMENTS) ×4 IMPLANT
CANISTER SUCT 3000ML PPV (MISCELLANEOUS) ×4 IMPLANT
CHANNEL WORK EXTEND EDGE 180 (KITS) IMPLANT
CHANNEL WORK EXTEND EDGE 45 (KITS) IMPLANT
CHANNEL WORK EXTEND EDGE 90 (KITS) IMPLANT
CONT SPEC 4OZ CLIKSEAL STRL BL (MISCELLANEOUS) ×4 IMPLANT
COVER BACK TABLE 60X90IN (DRAPES) ×4 IMPLANT
FILTER STRAW FLUID ASPIR (MISCELLANEOUS) IMPLANT
FORCEPS BIOP SUPERTRX PREMAR (INSTRUMENTS) ×4 IMPLANT
GAUZE SPONGE 4X4 12PLY STRL (GAUZE/BANDAGES/DRESSINGS) ×4 IMPLANT
GLOVE SURG SS PI 7.5 STRL IVOR (GLOVE) ×8 IMPLANT
GOWN STRL REUS W/ TWL LRG LVL3 (GOWN DISPOSABLE) ×4 IMPLANT
GOWN STRL REUS W/TWL LRG LVL3 (GOWN DISPOSABLE) ×8
KIT CLEAN ENDO COMPLIANCE (KITS) ×4 IMPLANT
KIT LOCATABLE GUIDE (CANNULA) IMPLANT
KIT MARKER FIDUCIAL DELIVERY (KITS) IMPLANT
KIT PROCEDURE EDGE 180 (KITS) IMPLANT
KIT PROCEDURE EDGE 45 (KITS) IMPLANT
KIT PROCEDURE EDGE 90 (KITS) IMPLANT
KIT TURNOVER KIT B (KITS) ×4 IMPLANT
MARKER SKIN DUAL TIP RULER LAB (MISCELLANEOUS) ×4 IMPLANT
NEEDLE SUPERTRX PREMARK BIOPSY (NEEDLE) ×4 IMPLANT
NS IRRIG 1000ML POUR BTL (IV SOLUTION) ×4 IMPLANT
OIL SILICONE PENTAX (PARTS (SERVICE/REPAIRS)) ×4 IMPLANT
PAD ARMBOARD 7.5X6 YLW CONV (MISCELLANEOUS) ×8 IMPLANT
PATCHES PATIENT (LABEL) ×12 IMPLANT
SOL ANTI FOG 6CC (MISCELLANEOUS) ×2 IMPLANT
SOLUTION ANTI FOG 6CC (MISCELLANEOUS) ×2
SYR 20CC LL (SYRINGE) ×4 IMPLANT
SYR 20ML ECCENTRIC (SYRINGE) ×4 IMPLANT
SYR 50ML SLIP (SYRINGE) ×4 IMPLANT
TOWEL OR 17X24 6PK STRL BLUE (TOWEL DISPOSABLE) ×4 IMPLANT
TRAP SPECIMEN MUCOUS 40CC (MISCELLANEOUS) IMPLANT
TUBE CONNECTING 20'X1/4 (TUBING) ×1
TUBE CONNECTING 20X1/4 (TUBING) ×3 IMPLANT
UNDERPAD 30X30 (UNDERPADS AND DIAPERS) ×4 IMPLANT
VALVE BIOPSY  SINGLE USE (MISCELLANEOUS) ×2
VALVE BIOPSY SINGLE USE (MISCELLANEOUS) ×2 IMPLANT
VALVE SUCTION BRONCHIO DISP (MISCELLANEOUS) ×4 IMPLANT
WATER STERILE IRR 1000ML POUR (IV SOLUTION) ×4 IMPLANT

## 2020-08-25 NOTE — Discharge Instructions (Signed)
Flexible Bronchoscopy, Care After This sheet gives you information about how to care for yourself after your test. Your doctor may also give you more specific instructions. If you have problems or questions, contact your doctor. Follow these instructions at home: Eating and drinking  The day after the test, go back to your normal diet. Driving  Do not drive for 24 hours if you were given a medicine to help you relax (sedative).  Do not drive or use heavy machinery while taking prescription pain medicine. General instructions   Take over-the-counter and prescription medicines only as told by your doctor.  Return to your normal activities as told. Ask what activities are safe for you.  Do not use any products that have nicotine or tobacco in them. This includes cigarettes and e-cigarettes. If you need help quitting, ask your doctor.  Keep all follow-up visits as told by your doctor. This is important. It is very important if you had a tissue sample (biopsy) taken. Get help right away if:  You have shortness of breath that gets worse.  You get light-headed.  You feel like you are going to pass out (faint).  You have chest pain.  You cough up: ? More than a little blood. ? More blood than before. Summary  Do not eat or drink anything (not even water) for 2 hours after your test, or until your numbing medicine wears off.  Do not use cigarettes. Do not use e-cigarettes.  Get help right away if you have chest pain. This information is not intended to replace advice given to you by your health care provider. Make sure you discuss any questions you have with your health care provider. Document Revised: 07/28/2017 Document Reviewed: 09/02/2016 Elsevier Patient Education  2020 [employer].

## 2020-08-25 NOTE — Interval H&P Note (Signed)
History and Physical Interval Note:  08/25/2020 1:28 PM  George Lewis.  has presented today for surgery, with the diagnosis of lung nodule.  The various methods of treatment have been discussed with the patient and family. After consideration of risks, benefits and other options for treatment, the patient has consented to  Procedure(s): [place] (Bilateral) as a surgical intervention.  The patient's history has been reviewed, patient examined, no change in status, stable for surgery.  I have reviewed the patient's chart and labs.  Questions were answered to the patient's satisfaction.     [place]

## 2020-08-25 NOTE — Anesthesia Preprocedure Evaluation (Signed)
Anesthesia Evaluation  Patient identified by MRN, date of birth, ID band Patient awake    Reviewed: Allergy & Precautions, NPO status , Patient's Chart, lab work & pertinent test results  History of Anesthesia Complications Negative for: history of anesthetic complications  Airway Mallampati: IV  TM Distance: >3 FB Neck ROM: Full    Dental  (+) Teeth Intact   Pulmonary sleep apnea and Continuous Positive Airway Pressure Ventilation ,  Pulmonary nodule   Pulmonary exam normal        Cardiovascular hypertension, + Peripheral Vascular Disease  Normal cardiovascular exam+ dysrhythmias Atrial Fibrillation      Neuro/Psych negative neurological ROS  negative psych ROS   GI/Hepatic Neg liver ROS, GERD  ,  Endo/Other  diabetes  Renal/GU ESRF and DialysisRenal disease  negative genitourinary   Musculoskeletal negative musculoskeletal ROS (+)   Abdominal   Peds  Hematology  (+) anemia , coumadin   Anesthesia Other Findings   Reproductive/Obstetrics                             Anesthesia Physical Anesthesia Plan  ASA: III  Anesthesia Plan: General   Post-op Pain Management:    Induction: Intravenous  PONV Risk Score and Plan: 2 and Ondansetron, Dexamethasone, Treatment may vary due to age or medical condition and Midazolam  Airway Management Planned: Oral ETT  Additional Equipment: None  Intra-op Plan:   Post-operative Plan: Extubation in OR  Informed Consent: I have reviewed the patients History and Physical, chart, labs and discussed the procedure including the risks, benefits and alternatives for the proposed anesthesia with the patient or authorized representative who has indicated his/her understanding and acceptance.     Dental advisory given  Plan Discussed with:   Anesthesia Plan Comments:         Anesthesia Quick Evaluation

## 2020-08-25 NOTE — Anesthesia Procedure Notes (Addendum)
Procedure Name: Intubation Date/Time: 08/25/2020 1:49 PM Performed by: Bryson Corona, CRNA Pre-anesthesia Checklist: Patient identified, Emergency Drugs available, Suction available and Patient being monitored Patient Re-evaluated:Patient Re-evaluated prior to induction Oxygen Delivery Method: Circle system utilized Preoxygenation: Pre-oxygenation with 100% oxygen Induction Type: IV induction Laryngoscope Size: Glidescope and 4 Grade View: Grade I Tube type: Oral Tube size: 8.5 mm Number of attempts: 1 Airway Equipment and Method: Stylet and Oral airway Placement Confirmation: ETT inserted through vocal cords under direct vision,  positive ETCO2 and breath sounds checked- equal and bilateral Secured at: 25 cm Tube secured with: Tape Dental Injury: Teeth and Oropharynx as per pre-operative assessment  Difficulty Due To: Difficulty was anticipated and Difficult Airway- due to large tongue Comments: Large tongue, short neck, and large amount of peritonsillar tissue.

## 2020-08-25 NOTE — Transfer of Care (Signed)
Immediate Anesthesia Transfer of Care Note  Patient: George Lewis.  Procedure(s) Performed: VIDEO BRONCHOSCOPY WITH ENDOBRONCHIAL NAVIGATION (Bilateral ) BRONCHIAL BRUSHINGS BRONCHIAL NEEDLE ASPIRATION BIOPSIES BRONCHIAL WASHINGS  Patient Location: Endoscopy Unit  Anesthesia Type:General  Level of Consciousness: awake, alert  and oriented  Airway & Oxygen Therapy: Patient Spontanous Breathing  Post-op Assessment: Report given to RN and Post -op Vital signs reviewed and stable  Post vital signs: Reviewed and stable  Last Vitals:  Vitals Value Taken Time  BP 92/35 08/25/20 1501  Temp    Pulse 99 08/25/20 1502  Resp 24 08/25/20 1502  SpO2 92 % 08/25/20 1502  Vitals shown include unvalidated device data.  Last Pain:  Vitals:   08/25/20 1041  TempSrc:   PainSc: 0-No pain      Patients Stated Pain Goal: 7 (18/33/58 2518)  Complications: No complications documented.

## 2020-08-25 NOTE — Progress Notes (Signed)
cxr done. Dr notified

## 2020-08-25 NOTE — Op Note (Signed)
Video Bronchoscopy with Electromagnetic Navigation Procedure Note  Date of Operation: 08/25/2020  Pre-op Diagnosis: Left upper lobe, lingular lung nodule  Post-op Diagnosis: Left upper lobe, lingular lung nodule  Surgeon: Garner Nash, DO   Assistants: None   Anesthesia: General endotracheal anesthesia  Operation: Flexible video fiberoptic bronchoscopy with electromagnetic navigation and biopsies.  Estimated Blood Loss: Minimal, <1 cc   Complications: None   Indications and History: George Lewis. is a 64 y.o. male with left upper lobe lingular lung nodule, slowly enlarging, history of renal cell carcinoma and pancreatic lesion..  The risks, benefits, complications, treatment options and expected outcomes were discussed with the patient.  The possibilities of pneumothorax, pneumonia, reaction to medication, pulmonary aspiration, perforation of a viscus, bleeding, failure to diagnose a condition and creating a complication requiring transfusion or operation were discussed with the patient who freely signed the consent.    Description of Procedure: The patient was seen in the Preoperative Area, was examined and was deemed appropriate to proceed.  The patient was taken to [facility] endoscopy room 2, identified as George Lewis. and the procedure verified as Flexible Video Fiberoptic Bronchoscopy.  A Time Out was held and the above information confirmed.   Prior to the date of the procedure a high-resolution CT scan of the chest was performed. Utilizing [place] a virtual tracheobronchial tree was generated to allow the creation of distinct navigation pathways to the patient's parenchymal abnormalities. After being taken to the operating room general anesthesia was initiated and the patient  was orally intubated. The video fiberoptic bronchoscope was introduced via the endotracheal tube and a general inspection was performed which showed normal right and left lung  anatomy with no evidence of endobronchial lesion. The extendable working channel and locator guide were introduced into the bronchoscope.  A full fluoroscopic sweep was obtained from RAO 25 degrees to LAO 25 degrees with inspiratory breath-hold APL at 20 cm water.  This was used for fluoroscopy navigation local registration.  The distinct navigation pathways prepared prior to this procedure were then utilized to navigate to within 0.9cm of patient's lesion(s) identified on CT scan.  Overall due to the location I believe there was significant CT and fluoroscopy navigation lesional divergence.  Under the fluoroscopic sweep it did appear as if there was a similar shaped lesion superior to the catheter tip however did not necessarily correlate with the radiographic location identified on CT.  Ended up taking samples from both the initial CT defined spot as well as the location chosen on fluoroscopy navigation.  Positioning of the catheter direction attempted for confirmation with fluoroscopic images faced caudally and distally on the patient.  Due to the anterior location in front of the heart it was a difficult to identify a window that had more direct visualization of the lesion. The extendable working channel was secured into place and the locator guide was withdrawn. Under fluoroscopic guidance transbronchial needle brushings, transbronchial Wang needle biopsies, and transbronchial forceps biopsies were performed to be sent for cytology and pathology. A bronchioalveolar lavage was performed in the left upper lobe and sent for cytology. At the end of the procedure a general airway inspection was performed and there was no evidence of active bleeding.  Standard therapeutic bronchoscope was inserted into the airway and bilateral mainstem's were aspirated for clearance of clot and any remaining debris or secretions.  The bronchoscope was removed.  The patient tolerated the procedure well. There was no significant blood  loss  and there were no obvious complications. A post-procedural chest x-ray is pending.  Samples: 1. Transbronchial needle brushings from left upper lobe 2. Transbronchial Wang needle biopsies from left upper lobe 3. Transbronchial forceps biopsies from left upper lobe 4. Bronchoalveolar lavage from left upper lobe  Plans:  The patient will be discharged from the PACU to home when recovered from anesthesia and after chest x-ray is reviewed. We will review the cytology, pathology and microbiology results with the patient when they become available. Outpatient followup will be with Garner Nash, DO.   Garner Nash, DO [facility] 08/25/2020 3:14 PM

## 2020-08-26 ENCOUNTER — Encounter (HOSPITAL_COMMUNITY): Payer: Self-pay | Admitting: Pulmonary Disease

## 2020-08-26 LAB — CYTOLOGY - NON PAP

## 2020-08-27 NOTE — Anesthesia Postprocedure Evaluation (Signed)
Anesthesia Post Note  Patient: George Lewis.  Procedure(s) Performed: VIDEO BRONCHOSCOPY WITH ENDOBRONCHIAL NAVIGATION (Bilateral ) BRONCHIAL BRUSHINGS BRONCHIAL NEEDLE ASPIRATION BIOPSIES BRONCHIAL WASHINGS     Patient location during evaluation: PACU Anesthesia Type: General Level of consciousness: awake and alert Pain management: pain level controlled Vital Signs Assessment: post-procedure vital signs reviewed and stable Respiratory status: spontaneous breathing, nonlabored ventilation and respiratory function stable Cardiovascular status: blood pressure returned to baseline and stable Postop Assessment: no apparent nausea or vomiting Anesthetic complications: no   No complications documented.  Last Vitals:  Vitals:   08/25/20 1510 08/25/20 1520  BP: (!) 98/43 (!) 102/48  Pulse: 99 98  Resp: (!) 26 20  Temp:    SpO2: 97% 99%    Last Pain:  Vitals:   08/25/20 1520  TempSrc:   PainSc: 0-No pain                 Audry Pili

## 2020-08-31 ENCOUNTER — Other Ambulatory Visit: Payer: Self-pay

## 2020-08-31 ENCOUNTER — Telehealth: Payer: Self-pay | Admitting: Pulmonary Disease

## 2020-08-31 ENCOUNTER — Ambulatory Visit (INDEPENDENT_AMBULATORY_CARE_PROVIDER_SITE_OTHER): Payer: PRIVATE HEALTH INSURANCE

## 2020-08-31 ENCOUNTER — Ambulatory Visit (INDEPENDENT_AMBULATORY_CARE_PROVIDER_SITE_OTHER): Payer: PRIVATE HEALTH INSURANCE | Admitting: Internal Medicine

## 2020-08-31 ENCOUNTER — Encounter: Payer: Self-pay | Admitting: Internal Medicine

## 2020-08-31 VITALS — BP 98/88 | HR 114 | Ht 73.0 in | Wt 286.8 lb

## 2020-08-31 DIAGNOSIS — Z79899 Other long term (current) drug therapy: Secondary | ICD-10-CM

## 2020-08-31 DIAGNOSIS — I480 Paroxysmal atrial fibrillation: Secondary | ICD-10-CM

## 2020-08-31 DIAGNOSIS — D6869 Other thrombophilia: Secondary | ICD-10-CM | POA: Diagnosis not present

## 2020-08-31 DIAGNOSIS — Z7901 Long term (current) use of anticoagulants: Secondary | ICD-10-CM

## 2020-08-31 DIAGNOSIS — I10 Essential (primary) hypertension: Secondary | ICD-10-CM

## 2020-08-31 DIAGNOSIS — Z794 Long term (current) use of insulin: Secondary | ICD-10-CM

## 2020-08-31 DIAGNOSIS — E669 Obesity, unspecified: Secondary | ICD-10-CM

## 2020-08-31 DIAGNOSIS — Z992 Dependence on renal dialysis: Secondary | ICD-10-CM

## 2020-08-31 DIAGNOSIS — N186 End stage renal disease: Secondary | ICD-10-CM

## 2020-08-31 DIAGNOSIS — E1122 Type 2 diabetes mellitus with diabetic chronic kidney disease: Secondary | ICD-10-CM

## 2020-08-31 LAB — POCT INR: INR: 1.2 — AB (ref 2.0–3.0)

## 2020-08-31 NOTE — Patient Instructions (Signed)
Take 2.5 tablets today and then Continue taking warfarin 1 tablet (3mg ) daily except 1.5 (4.5mg )  tablet each Monday and Friday.  Repeat INR in 6 weeks.

## 2020-08-31 NOTE — Telephone Encounter (Signed)
Called and spoke with pt's wife Vaughan Basta who stated that they missed a call from Dr. Valeta Harms about test results and are requesting a call back. Dr. Valeta Harms, please advise.

## 2020-08-31 NOTE — Telephone Encounter (Signed)
Will add to cancer conference discussion this week

## 2020-08-31 NOTE — Patient Instructions (Signed)
Medication Instructions:  STOP: ASPIRIN *If you need a refill on your cardiac medications before your next appointment, please call your pharmacy*  Follow-Up: At [facility], you and your health needs are our priority.  As part of our continuing mission to provide you with exceptional heart care, we have created designated Provider Care Teams.  These Care Teams include your primary Cardiologist (physician) and Advanced Practice Providers (APPs -  Physician Assistants and Nurse Practitioners) who all work together to provide you with the care you need, when you need it.  We recommend signing up for the patient portal called "MyChart".  Sign up information is provided on this After Visit Summary.  MyChart is used to connect with patients for Virtual Visits (Telemedicine).  Patients are able to view lab/test results, encounter notes, upcoming appointments, etc.  Non-urgent messages can be sent to your provider as well.   To learn more about what you can do with MyChart, go to [email or web address].    Your next appointment:   2 month(s)  The format for your next appointment:   In Person  Provider:   Cherlynn Kaiser, MD  Other Instructions [place]

## 2020-08-31 NOTE — Progress Notes (Signed)
Cardiology Office Note:    Date:  08/31/2020   ID:  George Mead., DOB 17-Mar-1956, MRN [id number]  PCP:  Lorrene Reid, PA-C  Cardiologist:  Elouise Munroe, MD  Electrophysiologist:  None   Referring MD: Lorrene Reid, PA-C   Chief Complaint/Reason for Referral: PAF  History of Present Illness:    George Bejar. is a 65 y.o. male with a history of right renal cell carcinoma s/p nephrectomy in 9/19, ESRD on HD (TTS), HTN, HLD, SVT, DM type II, OSA on CPAP, morbid obesity, and paroxysmal atrial fibrillation. He presents for cardiovascular follow up. He is here with his wife George Lewis.    George Lewis recently underwent bronchoscopy with biopsy of left upper lobe lingular lung nodule.  Preliminary notes in the chart suggest atypical cells are seen.  I do not see final pathology, however during our encounter it appears Dr. Valeta Harms left a telephone encounter attempting to contact the patient to review results further.  I defer to him to share results with the patient and next steps, patient and his wife are anticipating this call.  Patient shares with me that he may need CT-guided biopsy for definitive pathology.  He is also undergoing a work-up for renal transplant at [facility].  I have reviewed consultation from transplant where they feel he is a borderline candidate for transplantation given BMI, lung nodules, hypotension, and chronic use of Coumadin.  We reviewed his use of Coumadin in detail today.  He does have a history of pulmonary emboli remotely.  However he also has a history of paroxysmal atrial fibrillation with a CHA2DS2-VASc score of at least 2 (hypertension, diabetes).  There is historical documentation of PAD, however the patient does not recall any significant issues with PAD, and feels he had no worrisome findings on recent transplant evaluation imaging of his legs performed at [facility].  He was previously taking midodrine but this was stopped as  it was not needed after he was advised to stop Flomax.  He has had less hypotension.  I have reviewed his blood pressure log provided by him and his wife today.  He is symptomatic with low blood pressures as low as 91/55 on January 1 and December 23 blood pressure of 99/60 3 mmHg.  He goes for dialysis Tuesday Thursday Saturday.  His symptoms with hypotension are weakness.  Overall his blood pressure has been acceptable.  He is reported to have a history of PAD, but he is not aware of this diagnosis, and it seems it is not severe historically.  We have discussed in the past stopping aspirin.  There is concern from renal transplant team about risk of bleeding if he were to be called for transplant, in light of this it is reasonable to stop his aspirin today.  I have reviewed this with the pharmacist in our office at a prior visit with similar recommendations.  He was last seen by the A. fib clinic in May 2021 at which time he was instructed to continue warfarin.  There are concerns about his use of Coumadin and possible need for renal transplant.  In addition he may have an active malignancy that is being worked up.  His anticoagulation may need to be revisited and we will arrange for A. fib clinic follow-up in light of this.   Past Medical History:  Diagnosis Date  . Anemia   . Arthritis    knees  . Atrial fibrillation ([place])   .  Bilateral renal cysts 03/23/2018   Noted MRI ABD  . Cancer [facility])    right kidney cancer  . Cancer [facility])    pancreatic  . Cholelithiasis 03/19/2018   noted on CT AB/Pelvis  . CKD (chronic kidney disease), stage IV [facility])    nephrologist-- dr Hollie Salk [name]);  05-01-2018 has not started dialysis, scheduled for AV fistula creation 05-07-2018  . Diabetes mellitus without complication ([place])   . Diverticulosis of colon 04/19/2018   Noted on CT abd/pelvis  . GERD (gastroesophageal reflux disease)   . Hepatic steatosis 03/19/2018   Mild diffuse, noted on CT  AB/Pelvis  . History of kidney stones   . History of pulmonary embolus (PE) 03/2008   treated with coumadin for 6 months  . History of sepsis 04/20/2018   per discharge note , probable UTI  . Hypertension   . IBS (irritable bowel syndrome)   . Nocturia   . OSA on CPAP    uses CPAP nightly  . Pancreatic lesion    0.4cm cystic per CT 07/ 2019  . Peripheral vascular disease ([place])   . Pneumonia    x 1  . Pulmonary nodule    Solid 94m Right Lower Lobe  . Right renal mass 04/10/2018   new dx--  scheduled for nephrectomy 05-16-2018  . S/P ureteral stent placement: 04/16/2018 04/19/2018    Past Surgical History:  Procedure Laterality Date  . AV FISTULA PLACEMENT Left 05/07/2018   Procedure: Creation of Left arm Radiocephalic Fistula;  Surgeon: CMarty Heck MD;  [place]  Service: Vascular;  Laterality: Left;  . AV FISTULA PLACEMENT Left 05/15/2019   Procedure: Creation of Brachiocephalic fistula left arm;  Surgeon: CWaynetta Sandy MD;  [place]  Service: Vascular;  Laterality: Left;  . BASCILIC VEIN TRANSPOSITION Left 07/15/2019   Procedure: BASILIC VEIN TRANSPOSITION SECOND STAGE;  Surgeon: CWaynetta Sandy MD;  [place]  Service: Vascular;  Laterality: Left;  . BRONCHIAL BRUSHINGS  08/25/2020   Procedure: BRONCHIAL BRUSHINGS;  Surgeon: IGarner Nash DO;  [place] [facility];  Service: Pulmonary;;  . BRONCHIAL NEEDLE ASPIRATION BIOPSY  08/25/2020   Procedure: BRONCHIAL NEEDLE ASPIRATION BIOPSIES;  Surgeon: IGarner Nash DO;  [place];  Service: Pulmonary;;  . BRONCHIAL WASHINGS  08/25/2020   Procedure: BRONCHIAL WASHINGS;  Surgeon: IGarner Nash DO;  [place];  Service: Pulmonary;;  . COLONOSCOPY    . CYSTOSCOPY/RETROGRADE/URETEROSCOPY/STONE EXTRACTION WITH BASKET  x2 last one 1990s approx.  . CYSTOSCOPY/URETEROSCOPY/HOLMIUM LASER/STENT PLACEMENT Left 04/16/2018   Procedure: CYSTOSCOPY/LEFT  URETEROSCOPY/LEFT RETROGRADE/STENT PLACEMENT;  Surgeon: BLucas Mallow MD;  [facility];  Service: Urology;  Laterality: Left;  . EUS N/A 05/03/2018   Procedure: UPPER ENDOSCOPIC ULTRASOUND (EUS) RADIAL;  Surgeon: JMilus Banister MD;  [facility];  Service: Gastroenterology;  Laterality: N/A;  . EUS N/A 05/03/2018   Procedure: UPPER ENDOSCOPIC ULTRASOUND (EUS) LINEAR;  Surgeon: JMilus Banister MD;  [facility];  Service: Gastroenterology;  Laterality: N/A;  . EXTRACORPOREAL SHOCK WAVE LITHOTRIPSY  x3  last one 2004 approx.  .Marland KitchenFINE NEEDLE ASPIRATION N/A 05/03/2018   Procedure: FINE NEEDLE ASPIRATION (FNA) LINEAR;  Surgeon: JMilus Banister MD;  [facility];  Service: Gastroenterology;  Laterality: N/A;  . LAPAROSCOPIC NEPHRECTOMY, HAND ASSISTED Right 05/16/2018   Procedure: HAND ASSISTED LAPAROSCOPIC RIGHT NEPHRECTOMY;  Surgeon: BLucas Mallow MD;  [facility];  Service: Urology;  Laterality: Right;  .  left index finger attachment  1980s   3-4 surgeries  . TOE SURGERY  1980s   in beween 2nd and 3rd toes cyst removed  . UPPER GI ENDOSCOPY    . VIDEO BRONCHOSCOPY WITH ENDOBRONCHIAL NAVIGATION Bilateral 08/25/2020   Procedure: VIDEO BRONCHOSCOPY WITH ENDOBRONCHIAL NAVIGATION;  Surgeon: Garner Nash, DO;  Location: [place];  Service: Pulmonary;  Laterality: Bilateral;    Current Medications: Current Meds  Medication Sig  . Accu-Chek FastClix Lancets MISC Use to check fasting blood sugar and 2 hrs after largest meal.  . ACCU-CHEK GUIDE test strip USE 1 STRIP TO CHECK FASTING BLOOD SUGAR AND 2 HOURS AFTER LARGEST MEAL  . acetaminophen (TYLENOL) 500 MG tablet Take 1,000 mg by mouth as needed for mild pain or headache.   . allopurinol (ZYLOPRIM) 100 MG tablet Take 1 tablet (100 mg total) by mouth every evening.  Marland Kitchen amiodarone (PACERONE) 200 MG tablet Take 1/2 (one-half) tablet by mouth once daily  . atorvastatin (LIPITOR) 40 MG tablet Take  1 tablet (40 mg total) by mouth daily. (Patient taking differently: Take 40 mg by mouth every evening.)  . AURYXIA 1 GM 210 MG(Fe) tablet Take 210 mg by mouth every evening.  . blood glucose meter kit and supplies Dispense based on patient and insurance preference. Use up to four times daily as directed. (FOR ICD-10 E10.9, E11.9).  Marland Kitchen diphenhydramine-acetaminophen (TYLENOL PM) 25-500 MG TABS tablet Take 2 tablets by mouth at bedtime as needed (sleep).  . fluticasone (FLONASE) 50 MCG/ACT nasal spray Place 1-2 sprays into both nostrils as needed for allergies or rhinitis.   Marland Kitchen loratadine (CLARITIN) 10 MG tablet Take 10 mg by mouth every evening.   . nitroGLYCERIN (NITROSTAT) 0.4 MG SL tablet Place 1 tablet (0.4 mg total) under the tongue every 5 (five) minutes as needed for chest pain.  Marland Kitchen omeprazole (PRILOSEC OTC) 20 MG tablet Take 40 mg by mouth daily before breakfast.   . warfarin (COUMADIN) 3 MG tablet Take 1 to 1.5 tablets by mouth daily as directed by coumadin clinic  . [DISCONTINUED] aspirin EC 81 MG EC tablet Take 1 tablet (81 mg total) by mouth daily. (Patient taking differently: Take 81 mg by mouth every evening.)     Allergies:   Penicillins   Social History   Tobacco Use  . Smoking status: Never Smoker  . Smokeless tobacco: Never Used  Vaping Use  . Vaping Use: Never used  Substance Use Topics  . Alcohol use: Never  . Drug use: Never     Family History: The patient's family history includes Alzheimer's disease in his father and mother; Cancer - Other in his sister; Heart attack in his father; Heart disease in his father.  ROS:   Please see the history of present illness.    All other systems reviewed and are negative.  EKGs/Labs/Other Studies Reviewed:    The following studies were reviewed today:  EKG:  Sinus tachycardia rate 114  Recent Labs: 10/07/2019: TSH 1.659 08/05/2020: ALT 16; Platelets 232 08/25/2020: BUN 92; Creatinine, Ser 9.80; Hemoglobin 10.5; Potassium  4.0; Sodium 139  Recent Lipid Panel    Component Value Date/Time   CHOL 177 08/05/2020 0852   TRIG 224 (H) 08/05/2020 0852   HDL 32 (L) 08/05/2020 0852   CHOLHDL 5.5 (H) 08/05/2020 0852   LDLCALC 106 (H) 08/05/2020 4010    Physical Exam:    VS:  BP 98/88 (BP Location: Right Arm, Patient Position: Sitting)   Pulse (!) 114  Ht _0  (1.854 m)   Wt 286 lb 12.8 oz (130.1 kg)   SpO2 98%   BMI 37.84 kg/m     Wt Readings from Last 5 Encounters:  08/31/20 286 lb 12.8 oz (130.1 kg)  08/25/20 277 lb 12.5 oz (126 kg)  08/12/20 285 lb 2 oz (129.3 kg)  08/03/20 289 lb 1.6 oz (131.1 kg)  07/15/20 285 lb 3.2 oz (129.4 kg)    Constitutional: No acute distress Eyes: sclera non-icteric, normal conjunctiva and lids ENMT: normal dentition, moist mucous membranes Cardiovascular: regular rhythm, tachycardic rate, no murmurs. S1 and S2 normal. No jugular venous distention.  Respiratory: clear to auscultation bilaterally GI : normal bowel sounds, soft and nontender. No distention.   MSK: extremities warm, well perfused. No edema.  Fistula in left upper extremity NEURO: grossly nonfocal exam, moves all extremities. PSYCH: alert and oriented x 3, normal mood and affect.   ASSESSMENT:    1. Paroxysmal atrial fibrillation (HCC)   2. Long term (current) use of anticoagulants   3. Medication management   4. Secondary hypercoagulable state ([place])   5. Hypertension, unspecified type   6. ESRD on hemodialysis ([place])   7. Type 2 diabetes mellitus with chronic kidney disease on chronic dialysis, with long-term current use of insulin ([place])   8. Obesity (BMI 30-39.9)    PLAN:    PAF -patient has a history of paroxysmal atrial fibrillation and for that he takes Coumadin with a CHA2DS2-VASc score of at least 2 for hypertension and diabetes.  Questionable history of PAD which would raise his CHA2DS2-VASc score to 3.  We have spent a long time today discussing indications for continued Coumadin.  He does  have a history of PE, but the indication to continue with anticoagulation is atrial fibrillation.  He raises concerns that if he is called urgently for renal transplant, what would need to be done with his warfarin.  We discussed the pros and cons of stopping warfarin altogether.  We have also discussed that if he has an active malignancy continued anticoagulation may be indicated with Lovenox instead which may resolve some issues.  Many decisions hinge on his pathology report from his bronchoscopy performed last week.  We will await that information and further work-up.  For the time being I would like for him to continue his Coumadin for stroke prevention and atrial fibrillation.  To continue this complex decision making I would like for him to revisit with the atrial fibrillation clinic.  Fortunately he has had no bleeding events.  We are stopping his aspirin today.  Hypertension-the patient has in fact had hypotension related to chronic dialysis.  No longer on midodrine and no longer on Flomax.  Overall blood pressure is stable.  He does have symptomatic hypotension.  If need be midodrine can be considered in the future.  Lung nodule undergoing work-up-suspicious for malignancy, undergoing work-up with pulmonary medicine.  We will follow along and support as needed.   Total time of encounter: 30 minutes total time of encounter, including 25 minutes spent in face-to-face patient care on the date of this encounter. This time includes coordination of care and counseling regarding above mentioned problem list. Remainder of non-face-to-face time involved reviewing chart documents/testing relevant to the patient encounter and documentation in the medical record. I have independently reviewed documentation from referring provider.   Cherlynn Kaiser, MD [place]  [facility]    Medication Adjustments/Labs and Tests Ordered: Current medicines are reviewed at length with the  patient today.  Concerns  regarding medicines are outlined above.   Orders Placed This Encounter  Procedures  . EKG 12-Lead    No orders of the defined types were placed in this encounter.   Patient Instructions  Medication Instructions:  STOP: ASPIRIN *If you need a refill on your cardiac medications before your next appointment, please call your pharmacy*  Follow-Up: At [facility], you and your health needs are our priority.  As part of our continuing mission to provide you with exceptional heart care, we have created designated Provider Care Teams.  These Care Teams include your primary Cardiologist (physician) and Advanced Practice Providers (APPs -  Physician Assistants and Nurse Practitioners) who all work together to provide you with the care you need, when you need it.  We recommend signing up for the patient portal called "MyChart".  Sign up information is provided on this After Visit Summary.  MyChart is used to connect with patients for Virtual Visits (Telemedicine).  Patients are able to view lab/test results, encounter notes, upcoming appointments, etc.  Non-urgent messages can be sent to your provider as well.   To learn more about what you can do with MyChart, go to [email or web address].    Your next appointment:   2 month(s)  The format for your next appointment:   In Person  Provider:   Cherlynn Kaiser, MD  Other Instructions [place]

## 2020-08-31 NOTE — Telephone Encounter (Signed)
PCCM:  I attempted to call the patient at the number provided in the chart.  A VM was left for the patient to call us back to review path results and next steps.   Path with just atypical cells present.   Plan to review case at [facility].   Garner Nash, DO [place] [facility] 08/31/2020 9:56 AM

## 2020-09-01 ENCOUNTER — Other Ambulatory Visit: Payer: Self-pay | Admitting: General Surgery

## 2020-09-01 DIAGNOSIS — C7A8 Other malignant neuroendocrine tumors: Secondary | ICD-10-CM

## 2020-09-01 NOTE — Telephone Encounter (Signed)
I called patients wife to discuss case.  Plans to discuss at [facility] on Thursday morning to discuss next steps.   [place] [facility] 09/01/2020 10:14 AM

## 2020-09-01 NOTE — Telephone Encounter (Signed)
Discussed with Dr. Valeta Harms, states he will call patient today to discuss.  Will forward message back to Dr. Valeta Harms.  Thanks!

## 2020-09-01 NOTE — Telephone Encounter (Addendum)
Procedure performed as scheduled. Will close message.

## 2020-09-02 ENCOUNTER — Other Ambulatory Visit: Payer: Self-pay

## 2020-09-02 NOTE — Progress Notes (Signed)
The proposed treatment discussed in conference is for discussion purposes only and is not a binding recommendation.  The patients have not been physically examined, or presented with their treatment options.  Therefore, final treatment plans cannot be decided.   

## 2020-09-03 ENCOUNTER — Telehealth: Payer: Self-pay | Admitting: Pulmonary Disease

## 2020-09-03 ENCOUNTER — Other Ambulatory Visit: Payer: Self-pay | Admitting: *Deleted

## 2020-09-03 DIAGNOSIS — C3490 Malignant neoplasm of unspecified part of unspecified bronchus or lung: Secondary | ICD-10-CM

## 2020-09-03 NOTE — Telephone Encounter (Signed)
PCCM:  Patient's case was discussed at [facility].   Left upper lobe bx with atypical cells, more consistent with possible lung primary? But no definitive. They did not think it looked like metastatic renal cell.   PET scan ordered (please obtain ASAP)   Referral to cardiothoracic surgery, Dr. Dagmar Hait, DO [place] [facility] 09/03/2020 8:03 AM

## 2020-09-03 NOTE — Telephone Encounter (Signed)
Currently, 1/17 is the first available PET.  Scheduling asked me to call back tomorrow before 4:30 to see if we are able to get this worked in somewhere sooner.

## 2020-09-03 NOTE — Telephone Encounter (Signed)
I have called and spoke with pts wife and she stated that the PET scan is scheduled for 1/19 and she is aware that referral has been sent for Dr. Roxan Hockey.  She is aware that someone will reach out to them about this appt.

## 2020-09-03 NOTE — Progress Notes (Signed)
The proposed treatment discussed in cancer conference 09/03/20 is for discussion purpose only and is not a binding recommendation.  The patient was not physically examined nor present for their treatment options.  Therefore, final treatment plans cannot be decided.

## 2020-09-04 NOTE — Telephone Encounter (Signed)
Still unable to get a sooner PET scheduled.

## 2020-09-08 NOTE — Telephone Encounter (Signed)
Per Central Scheduling pt has the first available PET.  I will continue to attempt to get it sooner.

## 2020-09-14 ENCOUNTER — Ambulatory Visit: Payer: PRIVATE HEALTH INSURANCE | Admitting: Pulmonary Disease

## 2020-09-16 ENCOUNTER — Encounter (HOSPITAL_COMMUNITY)
Admission: RE | Admit: 2020-09-16 | Discharge: 2020-09-16 | Disposition: A | Discharge status: Home / Self Care | Payer: PRIVATE HEALTH INSURANCE | Source: Ambulatory Visit | Attending: Pulmonary Disease | Admitting: Pulmonary Disease

## 2020-09-16 ENCOUNTER — Other Ambulatory Visit: Payer: Self-pay

## 2020-09-16 DIAGNOSIS — C3490 Malignant neoplasm of unspecified part of unspecified bronchus or lung: Secondary | ICD-10-CM | POA: Insufficient documentation

## 2020-09-16 LAB — GLUCOSE, CAPILLARY: Glucose-Capillary: 109 mg/dL — ABNORMAL HIGH (ref 70–99)

## 2020-09-16 MED ORDER — FLUDEOXYGLUCOSE F - 18 (FDG) INJECTION
14.2900 | Freq: Once | INTRAVENOUS | Status: AC | PRN
  Administered 2020-09-16: 14.29 via INTRAVENOUS

## 2020-09-17 ENCOUNTER — Encounter: Payer: PRIVATE HEALTH INSURANCE | Admitting: Thoracic Surgery (Cardiothoracic Vascular Surgery)

## 2020-09-21 ENCOUNTER — Other Ambulatory Visit: Payer: Self-pay

## 2020-09-21 ENCOUNTER — Encounter: Payer: Self-pay | Admitting: Thoracic Surgery (Cardiothoracic Vascular Surgery)

## 2020-09-21 ENCOUNTER — Institutional Professional Consult (permissible substitution): Payer: PRIVATE HEALTH INSURANCE | Admitting: Thoracic Surgery (Cardiothoracic Vascular Surgery)

## 2020-09-21 VITALS — BP 74/48 | HR 127 | Resp 20 | Ht 73.0 in | Wt 289.2 lb

## 2020-09-21 DIAGNOSIS — R918 Other nonspecific abnormal finding of lung field: Secondary | ICD-10-CM | POA: Diagnosis not present

## 2020-09-21 NOTE — Progress Notes (Signed)
PCP is George Reid, PA-C Referring Provider is Icard, George Graves, DO  Chief Complaint  Patient presents with  . Lung Lesion    Initial surgical consult, CT chest 12/1, bronch 12/28, PET 09/16/20    HPI: [name] is sent for consultation regarding bilateral pulmonary nodules  George Lewis is a 65 year old man with a complex past medical history including renal cell carcinoma, pancreatic mass, bilateral lung nodules, history of pulmonary embolus (12 years ago), obesity, atrial fibrillation, chronic anticoagulation, obstructive sleep apnea, end-stage renal disease on hemodialysis, type 2 diabetes, reflux, arthritis, and numerous other issues.  He had a right nephrectomy for a stage IV renal cell carcinoma in 2019.  He had undergone a biopsy of a pancreatic lesion around that time.  He was told it was benign.  He has been followed with CT scans.  He has had two lung nodules, one in the right lower lobe and the other in the lingula that have slowly increased in size over time.  He had a PET/CT which showed a hypermetabolic 1 cm mass in the pancreatic neck and significant metabolic activity in the lingular nodule with SUV of 3.8.  There is low-level uptake in the right lower lobe nodule 1.6.  However, that nodule has grown over time.  He had bronchoscopy and biopsy by Dr. Valeta Lewis.  That was nondiagnostic.  Past Medical History:  Diagnosis Date  . Anemia   . Arthritis    knees  . Atrial fibrillation ([place])   . Bilateral renal cysts 03/23/2018   Noted MRI ABD  . Cancer [facility])    right kidney cancer  . Cancer [facility])    pancreatic  . Cholelithiasis 03/19/2018   noted on CT AB/Pelvis  . CKD (chronic kidney disease), stage IV [facility])    nephrologist-- dr Hollie Salk [name]);  05-01-2018 has not started dialysis, scheduled for AV fistula creation 05-07-2018  . Diabetes mellitus without complication ([place])   . Diverticulosis of colon 04/19/2018   Noted on CT abd/pelvis  . GERD  (gastroesophageal reflux disease)   . Hepatic steatosis 03/19/2018   Mild diffuse, noted on CT AB/Pelvis  . History of kidney stones   . History of pulmonary embolus (PE) 03/2008   treated with coumadin for 6 months  . History of sepsis 04/20/2018   per discharge note , probable UTI  . Hypertension   . IBS (irritable bowel syndrome)   . Nocturia   . OSA on CPAP    uses CPAP nightly  . Pancreatic lesion    0.4cm cystic per CT 07/ 2019  . Peripheral vascular disease ([place])   . Pneumonia    x 1  . Pulmonary nodule    Solid 70m Right Lower Lobe  . Right renal mass 04/10/2018   new dx--  scheduled for nephrectomy 05-16-2018  . S/P ureteral stent placement: 04/16/2018 04/19/2018    Past Surgical History:  Procedure Laterality Date  . AV FISTULA PLACEMENT Left 05/07/2018   Procedure: Creation of Left arm Radiocephalic Fistula;  Surgeon: CMarty Heck MD;  [place] [facility]  Service: Vascular;  Laterality: Left;  . AV FISTULA PLACEMENT Left 05/15/2019   Procedure: Creation of Brachiocephalic fistula left arm;  Surgeon: CWaynetta Sandy MD;  [place] [facility]  Service: Vascular;  Laterality: Left;  . BASCILIC VEIN TRANSPOSITION Left 07/15/2019   Procedure: BASILIC VEIN TRANSPOSITION SECOND STAGE;  Surgeon: CWaynetta Sandy MD;  [place] [facility]  Service: Vascular;  Laterality: Left;  . BRONCHIAL  BRUSHINGS  08/25/2020   Procedure: BRONCHIAL BRUSHINGS;  Surgeon: Garner Nash, DO;  Location: [facility];  Service: Pulmonary;;  . BRONCHIAL NEEDLE ASPIRATION BIOPSY  08/25/2020   Procedure: BRONCHIAL NEEDLE ASPIRATION BIOPSIES;  Surgeon: Garner Nash, DO;  Location: [place] [facility];  Service: Pulmonary;;  . BRONCHIAL WASHINGS  08/25/2020   Procedure: BRONCHIAL WASHINGS;  Surgeon: Garner Nash, DO;  Location: [place] [facility];  Service: Pulmonary;;  . COLONOSCOPY    . CYSTOSCOPY/RETROGRADE/URETEROSCOPY/STONE EXTRACTION WITH BASKET  x2 last one 1990s approx.  .  CYSTOSCOPY/URETEROSCOPY/HOLMIUM LASER/STENT PLACEMENT Left 04/16/2018   Procedure: CYSTOSCOPY/LEFT URETEROSCOPY/LEFT RETROGRADE/STENT PLACEMENT;  Surgeon: Lucas Mallow, MD;  [facility];  Service: Urology;  Laterality: Left;  . EUS N/A 05/03/2018   Procedure: UPPER ENDOSCOPIC ULTRASOUND (EUS) RADIAL;  Surgeon: Milus Banister, MD;  Location[facility];  Service: Gastroenterology;  Laterality: N/A;  . EUS N/A 05/03/2018   Procedure: UPPER ENDOSCOPIC ULTRASOUND (EUS) LINEAR;  Surgeon: Milus Banister, MD;  Location[facility];  Service: Gastroenterology;  Laterality: N/A;  . EXTRACORPOREAL SHOCK WAVE LITHOTRIPSY  x3  last one 2004 approx.  Marland Kitchen FINE NEEDLE ASPIRATION N/A 05/03/2018   Procedure: FINE NEEDLE ASPIRATION (FNA) LINEAR;  Surgeon: Milus Banister, MD;  [facility];  Service: Gastroenterology;  Laterality: N/A;  . LAPAROSCOPIC NEPHRECTOMY, HAND ASSISTED Right 05/16/2018   Procedure: HAND ASSISTED LAPAROSCOPIC RIGHT NEPHRECTOMY;  Surgeon: Lucas Mallow, MD;  [facility]: [facility];  Service: Urology;  Laterality: Right;  . left index finger attachment  1980s   3-4 surgeries  . TOE SURGERY  1980s   in beween 2nd and 3rd toes cyst removed  . UPPER GI ENDOSCOPY    . VIDEO BRONCHOSCOPY WITH ENDOBRONCHIAL NAVIGATION Bilateral 08/25/2020   Procedure: VIDEO BRONCHOSCOPY WITH ENDOBRONCHIAL NAVIGATION;  Surgeon: Garner Nash, DO;  Location: [place];  Service: Pulmonary;  Laterality: Bilateral;    Family History  Problem Relation Age of Onset  . Alzheimer's disease Mother   . Alzheimer's disease Father   . Heart disease Father   . Heart attack Father   . Cancer - Other Sister     Social History Social History   Tobacco Use  . Smoking status: Never Smoker  . Smokeless tobacco: Never Used  Vaping Use  . Vaping Use: Never used  Substance Use Topics  . Alcohol use: Never  . Drug use: Never    Current Outpatient Medications  Medication Sig Dispense Refill   . Accu-Chek FastClix Lancets MISC Use to check fasting blood sugar and 2 hrs after largest meal. 100 each 0  . ACCU-CHEK GUIDE test strip USE 1 STRIP TO CHECK FASTING BLOOD SUGAR AND 2 HOURS AFTER LARGEST MEAL 100 each 3  . acetaminophen (TYLENOL) 500 MG tablet Take 1,000 mg by mouth as needed for mild pain or headache.     . allopurinol (ZYLOPRIM) 100 MG tablet Take 1 tablet (100 mg total) by mouth every evening. 30 tablet 0  . amiodarone (PACERONE) 200 MG tablet Take 1/2 (one-half) tablet by mouth once daily 45 tablet 0  . atorvastatin (LIPITOR) 40 MG tablet Take 1 tablet (40 mg total) by mouth daily. (Patient taking differently: Take 40 mg by mouth every evening.) 30 tablet 0  . AURYXIA 1 GM 210 MG(Fe) tablet Take 210 mg by mouth every evening.    . blood glucose meter kit and supplies Dispense based on patient and insurance preference. Use up to four times daily as directed. (FOR ICD-10  E10.9, E11.9). 1 each 0  . diphenhydramine-acetaminophen (TYLENOL PM) 25-500 MG TABS tablet Take 2 tablets by mouth at bedtime as needed (sleep).    . fluticasone (FLONASE) 50 MCG/ACT nasal spray Place 1-2 sprays into both nostrils as needed for allergies or rhinitis.     Marland Kitchen loratadine (CLARITIN) 10 MG tablet Take 10 mg by mouth every evening.     . midodrine (PROAMATINE) 10 MG tablet Take 10 mg by mouth in the morning and at bedtime.    . nitroGLYCERIN (NITROSTAT) 0.4 MG SL tablet Place 1 tablet (0.4 mg total) under the tongue every 5 (five) minutes as needed for chest pain. 14 tablet 12  . omeprazole (PRILOSEC OTC) 20 MG tablet Take 40 mg by mouth daily before breakfast.     . Sennosides (SENOKOT EXTRA STRENGTH) 17.2 MG TABS Take 17.2 mg by mouth. Three once daily    . warfarin (COUMADIN) 3 MG tablet Take 1 to 1.5 tablets by mouth daily as directed by coumadin clinic 45 tablet 2   No current facility-administered medications for this visit.    Allergies  Allergen Reactions  . Penicillins Rash and Other  (See Comments)    Has patient had a PCN reaction causing immediate rash, facial/tongue/throat swelling, SOB or lightheadedness with hypotension: yes Has patient had a PCN reaction causing severe rash involving mucus membranes or skin necrosis: no Has patient had a PCN reaction that required hospitalization: no Has patient had a PCN reaction occurring within the last 10 years: no If all of the above answers are "NO", then may proceed with Cephalosporin use.     Review of Systems  Constitutional: Positive for activity change and fatigue. Negative for appetite change.  HENT: Negative for trouble swallowing and voice change.   Respiratory: Positive for apnea (CPAP).   Cardiovascular: Negative for chest pain and leg swelling.  Gastrointestinal: Positive for abdominal pain (Reflux).  Genitourinary:       Dialysis  Neurological: Positive for syncope (2 years ago).  Hematological: Bruises/bleeds easily (Coumadin).  All other systems reviewed and are negative.   BP (!) 74/48 (BP Location: Right Arm, Patient Position: Sitting)   Pulse (!) 127   Resp 20   Ht '6\' 1"'  (1.854 m)   Wt 289 lb 3.2 oz (131.2 kg)   SpO2 98% Comment: RA with mask on  BMI 38.16 kg/m  Physical Exam Constitutional:      General: He is not in acute distress.    Appearance: He is obese.  HENT:     Head: Normocephalic and atraumatic.  Eyes:     General: No scleral icterus.    Extraocular Movements: Extraocular movements intact.  Cardiovascular:     Rate and Rhythm: Tachycardia present. Rhythm irregular.     Heart sounds: Normal heart sounds. No murmur heard.   Pulmonary:     Effort: Pulmonary effort is normal. No respiratory distress.     Breath sounds: Normal breath sounds. No wheezing or rales.  Abdominal:     General: There is no distension.     Palpations: Abdomen is soft.     Tenderness: There is no abdominal tenderness.  Musculoskeletal:        General: No swelling.  Skin:    General: Skin is warm and  dry.  Neurological:     General: No focal deficit present.     Mental Status: He is alert and oriented to person, place, and time.     Cranial Nerves: No cranial nerve deficit.  Motor: No weakness.    Diagnostic Tests: NUCLEAR MEDICINE PET SKULL BASE TO THIGH  TECHNIQUE: 14.3 mCi F-18 FDG was injected intravenously. Full-ring PET imaging was performed from the skull base to thigh after the radiotracer. CT data was obtained and used for attenuation correction and anatomic localization.  Fasting blood glucose: 109 mg/dl  COMPARISON:  07/29/2020 chest CT.  08/05/2019 PET-CT.  FINDINGS: Mediastinal blood pool activity: SUV max 3.4  Liver activity: SUV max NA  NECK: No hypermetabolic lymph nodes in the neck.  Incidental CT findings: none  CHEST:  Mildly hypermetabolic solid 1.4 cm lingular pulmonary nodule with max SUV 3.8 (series 8/image 39), previously 1.0 cm with max SUV 0.7 on 08/05/2019 PET-CT, increased in size and metabolism.  Medial basilar right lower lobe 0.9 cm solid pulmonary nodule with low level uptake with max SUV 1.6 (series 8/image 48), previously 0.5 cm with max SUV 1.1, increased in size and metabolism.  No enlarged or hypermetabolic axillary, mediastinal or hilar lymph nodes.  Incidental CT findings: Coronary atherosclerosis.  ABDOMEN/PELVIS:  Hypermetabolic solid 1.0 cm pancreatic neck lesion near SMV with max SUV 8.7 (series 4/image 123), previously 1.0 cm with max SUV 12.4, stable in size and decreased in metabolism.  No evidence of recurrent mass in the right nephrectomy bed.  No abnormal hypermetabolic activity within the liver, adrenal glands, or spleen. No hypermetabolic lymph nodes in the abdomen or pelvis.  Incidental CT findings: Diffuse hepatic steatosis. Minimally atherosclerotic nonaneurysmal abdominal aorta. Moderate left colonic diverticulosis.  SKELETON: No focal hypermetabolic activity to suggest  skeletal metastasis.  Incidental CT findings: none  IMPRESSION: 1. Mildly hypermetabolic solid 1.4 cm lingula and 0.9 cm medial basilar right lower lobe pulmonary nodules, both increased in size and metabolism since 08/05/2019 PET-CT, suspicious for indolent malignancy. Slow growing renal cell carcinoma metastases are favored. 2. Hypermetabolic 1.0 cm pancreatic neck lesion, stable in size and decreased in metabolism since 08/05/2019 PET-CT, compatible with biopsy-proven neuroendocrine tumor. 3. No additional sites of hypermetabolic metastatic disease. No evidence of tumor recurrence in the right nephrectomy bed. 4. Chronic findings include: Aortic Atherosclerosis (ICD10-I70.0). Coronary atherosclerosis. Diffuse hepatic steatosis. Moderate left colonic diverticulosis.   Electronically Signed   By: George Sorrel M.D.   On: 09/16/2020 10:39 CT CHEST WITHOUT CONTRAST  TECHNIQUE: Multidetector CT imaging of the chest was performed following the standard protocol without IV contrast.  COMPARISON:  Chest abdomen and pelvis evaluation from July of 2020 and recent CT of the chest from August 2021  FINDINGS: Cardiovascular: Aorta is normal caliber. Three-vessel coronary artery disease. Heart size is normal. Central pulmonary vasculature is normal caliber. Limited assessment of cardiovascular structures given lack of intravenous contrast.  Mediastinum/Nodes: Esophagus grossly normal. No hilar lymphadenopathy. No mediastinal lymphadenopathy. No axillary lymphadenopathy.  Lungs/Pleura: RIGHT lower lobe pulmonary nodule (image 111, series 3) 9 mm previously 8 mm. The tiny 4 mm nodule adjacent to this on image 98 in the medial RIGHT lower lobe is stable.  LEFT pulmonary nodule in the lingula (image 88, series 3) 1.3 by 0.7 cm.  Tiny pulmonary nodule in the LEFT lower lobe is unchanged (image 105, series 100, reconstructed maximum intensity projection images. Scattered  calcified granulomata. No effusion. No consolidation. Airways are patent.  Upper Abdomen: 16 mm lesion in the neck of the pancreas corresponding to increased PET activity 14 mm on the most recent comparison and approximately 12 mm on the study of October of 2020 visualized portions of the adrenal glands are normal. The liver  gallbladder are unremarkable. Spleen normal size. No acute upper abdominal process.  Musculoskeletal: No acute musculoskeletal finding. Spinal degenerative changes.  IMPRESSION: 1. Slight interval enlargement of lesion in the neck of the pancreas, potentially metastatic or related to primary pancreatic neoplasm, given slow enlargement would favor indolent neoplasm such as islet cell tumor. 2. Bilateral pulmonary nodules displaying slow enlargement over time also suspicious for metastatic disease specifically with respect to medial RIGHT lung base and in the lingula. 3. Three-vessel coronary artery disease. 4. Aortic atherosclerosis.  Aortic Atherosclerosis (ICD10-I70.0).   Electronically Signed   By: George Bills M.D.   On: 07/29/2020 17:55  I personally reviewed the CT and PET/CT images.  There are bilateral pulmonary nodules.  Impression: George Lewis is a 65 year old man with a complex past medical history including renal cell carcinoma, pancreatic mass, bilateral lung nodules, history of pulmonary embolus (20 years ago), obesity, atrial fibrillation, chronic anticoagulation, obstructive sleep apnea, end-stage renal disease on hemodialysis, type 2 diabetes, reflux, arthritis, and numerous other issues.  He has lung nodules in the right lower lobe as well as in the lingular segment of the left upper lobe.  These are solid nodules that have slowly grown over time.  The lingular nodule is slightly larger and more hypermetabolic.  In all likelihood they represent the same process.  The differential diagnosis includes metastatic renal cell  carcinoma, metastatic lesions from the pancreas should that be malignant, primary bronchogenic carcinoma, as well as infectious and inflammatory nodules.  Metastatic renal cell carcinoma is the most likely.  Unfortunately hehas not been able to get a definitive diagnosis.  Wedge resection for definitive diagnosis with the straightforward from a technical standpoint.  However, any operation in his case is going to have significant risk for major complications given his other medical issues.  I discussed the proposed operation with him.  I informed him and his wife of the general nature of the procedure including the need for general anesthesia, the incisions to be used, the use of the robot, the need for drainage tube postoperatively, the expected hospital stay, and the overall recovery.  I informed them of the indications, risks, benefits, and alternatives.  They understand the lung nodules have to be addressed separately and would not be done at the same time.  They understand the risks include, but are not limited to death, MI, DVT, PE, bleeding, possible need for transfusion, infection, prolonged air leak, cardiac arrhythmias, as well as possibility of other unforeseeable complications.  They do understand he is a high risk patient.  End-stage renal disease-on dialysis Tuesday, Thursday and Saturday  He also has multiple other issues going on.  He is scheduled to have a pancreatic biopsy at [facility] on 10/05/2020.  I think we should wait until after that before considering a lung operation.  There also is the matter of his atrial fibrillation.  Today his heart rate is 127 although he is not particularly symptomatic he does feel tired and rundown.  He is scheduled to be seen about that in [place] in the near future.  Coronary calcification-noted on CT.  No anginal symptoms.  History of PE 12 years ago.  Currently is on Coumadin for atrial fibrillation Plan: I recommended that he follow-up regarding  his atrial fibrillation and had his pancreatic biopsy done.  I will see him back in about 3 weeks.  Melrose Nakayama, MD [facility] [phone]

## 2020-09-30 ENCOUNTER — Other Ambulatory Visit: Payer: Self-pay

## 2020-09-30 ENCOUNTER — Ambulatory Visit (HOSPITAL_COMMUNITY)
Admission: RE | Admit: 2020-09-30 | Discharge: 2020-09-30 | Disposition: A | Discharge status: Home / Self Care | Payer: Medicare Other | Source: Ambulatory Visit | Attending: Physician Assistant | Admitting: Physician Assistant

## 2020-09-30 VITALS — BP 88/70 | HR 116 | Ht 73.0 in | Wt 289.0 lb

## 2020-09-30 DIAGNOSIS — I480 Paroxysmal atrial fibrillation: Secondary | ICD-10-CM | POA: Diagnosis not present

## 2020-09-30 DIAGNOSIS — Z8249 Family history of ischemic heart disease and other diseases of the circulatory system: Secondary | ICD-10-CM | POA: Diagnosis not present

## 2020-09-30 DIAGNOSIS — R000 Tachycardia, unspecified: Secondary | ICD-10-CM | POA: Diagnosis not present

## 2020-09-30 DIAGNOSIS — Z7901 Long term (current) use of anticoagulants: Secondary | ICD-10-CM | POA: Insufficient documentation

## 2020-09-30 DIAGNOSIS — R5383 Other fatigue: Secondary | ICD-10-CM | POA: Diagnosis not present

## 2020-09-30 DIAGNOSIS — Z6838 Body mass index (BMI) 38.0-38.9, adult: Secondary | ICD-10-CM | POA: Diagnosis not present

## 2020-09-30 DIAGNOSIS — G4733 Obstructive sleep apnea (adult) (pediatric): Secondary | ICD-10-CM | POA: Diagnosis not present

## 2020-09-30 DIAGNOSIS — Z713 Dietary counseling and surveillance: Secondary | ICD-10-CM | POA: Insufficient documentation

## 2020-09-30 DIAGNOSIS — E669 Obesity, unspecified: Secondary | ICD-10-CM | POA: Insufficient documentation

## 2020-09-30 DIAGNOSIS — Z79899 Other long term (current) drug therapy: Secondary | ICD-10-CM | POA: Diagnosis not present

## 2020-09-30 DIAGNOSIS — I959 Hypotension, unspecified: Secondary | ICD-10-CM | POA: Insufficient documentation

## 2020-09-30 DIAGNOSIS — D6869 Other thrombophilia: Secondary | ICD-10-CM | POA: Insufficient documentation

## 2020-09-30 DIAGNOSIS — N186 End stage renal disease: Secondary | ICD-10-CM | POA: Diagnosis not present

## 2020-09-30 DIAGNOSIS — I120 Hypertensive chronic kidney disease with stage 5 chronic kidney disease or end stage renal disease: Secondary | ICD-10-CM | POA: Diagnosis not present

## 2020-09-30 MED ORDER — AMIODARONE HCL 200 MG PO TABS: 200.0000 mg | ORAL_TABLET | Freq: Every day | ORAL | 1 refills | Status: DC

## 2020-09-30 NOTE — Patient Instructions (Signed)
Increase amiodarone to 200mg  once a day  Follow up in 3 months

## 2020-09-30 NOTE — Progress Notes (Signed)
Electrophysiology TeleHealth Note   Audio/video telehealth visit is felt to be most appropriate for this patient at this time.  See consent below from today for patient consent regarding telehealth for the At[facility] Fibrillation Clinic.    Date:  09/30/2020   ID:  George Mead., DOB [birth year], MRN [id number]  Location: AF Clinic Provider location: [address] [place], [place] [address] Evaluation Performed: Follow up  PCP:  Lorrene Reid, PA-C  Primary Cardiologist: Dr Margaretann Loveless  Primary Electrophysiologist: none     History of Present Illness: George Hank. is a 65 y.o. male with a history of right renal cell carcinomas/pnephrectomy in 9/19, ESRD on HD(TTS), HTN,HLD,SVT, DM type II, OSA on CPAP,morbid obesity, and paroxysmal atrial fibrillation who presents for follow up in the [facility]. The patient was initially diagnosed with atrial fibrillation 04/22/19 while he was hospitalized for diarrhea/food poisoning. Telemetry initially showed afib with RVR with rates up to 200 bpm. His electrolytes were within normal limits. Echo 02/26/19 showed normal EF 60-65% with mild LAE. Patient was started on amiodarone and coumadin for a CHADS2VASC score of 3. He does have OSA and is on CPAP.   On follow up today, patient is currently on the kidney [facility] list at [facility]. He his also undergoing workup for possible active malignancies in his lungs and pancreas. He is in sinus tachycardia today. He does report increased fatigue.   Today, he denies symptoms of palpitations, chest pain, shortness of breath, orthopnea, PND, lower extremity edema, claudication, dizziness, presyncope, syncope, bleeding, or neurologic sequela. The patient is tolerating medications without difficulties and is otherwise without complaint today.    Atrial Fibrillation Risk Factors:   he does have symptoms or diagnosis of sleep apnea. he is compliant with CPAP therapy.   he  has a BMI of Body mass index is 38.13 kg/m.Marland Kitchen Filed Weights   09/30/20 0827  Weight: 131.1 kg     Atrial Fibrillation Management history:  Previous antiarrhythmic drugs: amiodarone Previous cardioversions: none Previous ablations: none CHADS2VASC score: 3 Anticoagulation history: warfarin    Past Medical History:  Diagnosis Date  . Anemia   . Arthritis    knees  . Atrial fibrillation ([place])   . Bilateral renal cysts 03/23/2018   Noted MRI ABD  . Cancer [facility])    right kidney cancer  . Cancer [facility])    pancreatic  . Cholelithiasis 03/19/2018   noted on CT AB/Pelvis  . CKD (chronic kidney disease), stage IV [facility])    nephrologist-- dr Hollie Salk [name]);  05-01-2018 has not started dialysis, scheduled for AV fistula creation 05-07-2018  . Diabetes mellitus without complication ([place])   . Diverticulosis of colon 04/19/2018   Noted on CT abd/pelvis  . GERD (gastroesophageal reflux disease)   . Hepatic steatosis 03/19/2018   Mild diffuse, noted on CT AB/Pelvis  . History of kidney stones   . History of pulmonary embolus (PE) 03/2008   treated with coumadin for 6 months  . History of sepsis 04/20/2018   per discharge note , probable UTI  . Hypertension   . IBS (irritable bowel syndrome)   . Nocturia   . OSA on CPAP    uses CPAP nightly  . Pancreatic lesion    0.4cm cystic per CT 07/ 2019  . Peripheral vascular disease ([place])   . Pneumonia    x 1  . Pulmonary nodule    Solid 71m Right Lower Lobe  . Right  renal mass 04/10/2018   new dx--  scheduled for nephrectomy 05-16-2018  . S/P ureteral stent placement: 04/16/2018 04/19/2018   Past Surgical History:  Procedure Laterality Date  . AV FISTULA PLACEMENT Left 05/07/2018   Procedure: Creation of Left arm Radiocephalic Fistula;  Surgeon: Marty Heck, MD;  Location: [place];  Service: Vascular;  Laterality: Left;  . AV FISTULA PLACEMENT Left 05/15/2019   Procedure: Creation of Brachiocephalic fistula left arm;   Surgeon: Waynetta Sandy, MD;  Location: [place];  Service: Vascular;  Laterality: Left;  . BASCILIC VEIN TRANSPOSITION Left 07/15/2019   Procedure: BASILIC VEIN TRANSPOSITION SECOND STAGE;  Surgeon: Waynetta Sandy, MD;  Location: [place];  Service: Vascular;  Laterality: Left;  . BRONCHIAL BRUSHINGS  08/25/2020   Procedure: BRONCHIAL BRUSHINGS;  Surgeon: Garner Nash, DO;  Location: [facility];  Service: Pulmonary;;  . BRONCHIAL NEEDLE ASPIRATION BIOPSY  08/25/2020   Procedure: BRONCHIAL NEEDLE ASPIRATION BIOPSIES;  Surgeon: Garner Nash, DO;  Location: [place] [facility];  Service: Pulmonary;;  . BRONCHIAL WASHINGS  08/25/2020   Procedure: BRONCHIAL WASHINGS;  Surgeon: Garner Nash, DO;  Location: [facility];  Service: Pulmonary;;  . COLONOSCOPY    . CYSTOSCOPY/RETROGRADE/URETEROSCOPY/STONE EXTRACTION WITH BASKET  x2 last one 1990s approx.  . CYSTOSCOPY/URETEROSCOPY/HOLMIUM LASER/STENT PLACEMENT Left 04/16/2018   Procedure: CYSTOSCOPY/LEFT URETEROSCOPY/LEFT RETROGRADE/STENT PLACEMENT;  Surgeon: Lucas Mallow, MD;  Location: [facility];  Service: Urology;  Laterality: Left;  . EUS N/A 05/03/2018   Procedure: UPPER ENDOSCOPIC ULTRASOUND (EUS) RADIAL;  Surgeon: Milus Banister, MD;  Location: [facility];  Service: Gastroenterology;  Laterality: N/A;  . EUS N/A 05/03/2018   Procedure: UPPER ENDOSCOPIC ULTRASOUND (EUS) LINEAR;  Surgeon: Milus Banister, MD;  Location: [facility];  Service: Gastroenterology;  Laterality: N/A;  . EXTRACORPOREAL SHOCK WAVE LITHOTRIPSY  x3  last one 2004 approx.  Marland Kitchen FINE NEEDLE ASPIRATION N/A 05/03/2018   Procedure: FINE NEEDLE ASPIRATION (FNA) LINEAR;  Surgeon: Milus Banister, MD;  Location: [facility];  Service: Gastroenterology;  Laterality: N/A;  . LAPAROSCOPIC NEPHRECTOMY, HAND ASSISTED Right 05/16/2018   Procedure: HAND ASSISTED LAPAROSCOPIC RIGHT NEPHRECTOMY;  Surgeon: Lucas Mallow, MD;  Location: [facility];  Service: Urology;   Laterality: Right;  . left index finger attachment  1980s   3-4 surgeries  . TOE SURGERY  1980s   in beween 2nd and 3rd toes cyst removed  . UPPER GI ENDOSCOPY    . VIDEO BRONCHOSCOPY WITH ENDOBRONCHIAL NAVIGATION Bilateral 08/25/2020   Procedure: VIDEO BRONCHOSCOPY WITH ENDOBRONCHIAL NAVIGATION;  Surgeon: Garner Nash, DO;  Location: [place];  Service: Pulmonary;  Laterality: Bilateral;     Current Outpatient Medications  Medication Sig Dispense Refill  . Accu-Chek FastClix Lancets MISC Use to check fasting blood sugar and 2 hrs after largest meal. 100 each 0  . ACCU-CHEK GUIDE test strip USE 1 STRIP TO CHECK FASTING BLOOD SUGAR AND 2 HOURS AFTER LARGEST MEAL 100 each 3  . acetaminophen (TYLENOL) 500 MG tablet Take 1,000 mg by mouth as needed for mild pain or headache.     . allopurinol (ZYLOPRIM) 100 MG tablet Take 1 tablet (100 mg total) by mouth every evening. 30 tablet 0  . atorvastatin (LIPITOR) 40 MG tablet Take 1 tablet (40 mg total) by mouth daily. (Patient taking differently: Take 40 mg by mouth every evening.) 30 tablet 0  . AURYXIA 1 GM 210 MG(Fe) tablet Take 210 mg by mouth every evening.    . blood  glucose meter kit and supplies Dispense based on patient and insurance preference. Use up to four times daily as directed. (FOR ICD-10 E10.9, E11.9). 1 each 0  . diphenhydramine-acetaminophen (TYLENOL PM) 25-500 MG TABS tablet Take 2 tablets by mouth at bedtime as needed (sleep).    . fluticasone (FLONASE) 50 MCG/ACT nasal spray Place 1-2 sprays into both nostrils as needed for allergies or rhinitis.     Marland Kitchen loratadine (CLARITIN) 10 MG tablet Take 10 mg by mouth every evening.     . midodrine (PROAMATINE) 10 MG tablet Taking 1 tablet in the am Tuesday, Thursday and Saturday and then 1 tablet half way through his treatment- Only on Dialysis days    . nitroGLYCERIN (NITROSTAT) 0.4 MG SL tablet Place 1 tablet (0.4 mg total) under the tongue every 5 (five) minutes as needed for  chest pain. 14 tablet 12  . omeprazole (PRILOSEC OTC) 20 MG tablet Take 40 mg by mouth daily before breakfast.     . Sennosides (SENOKOT EXTRA STRENGTH) 17.2 MG TABS Take 17.2 mg by mouth. Taking by mouth 2-3 times daily    . warfarin (COUMADIN) 3 MG tablet Take 1 to 1.5 tablets by mouth daily as directed by coumadin clinic 45 tablet 2  . amiodarone (PACERONE) 200 MG tablet Take 1 tablet (200 mg total) by mouth daily. 90 tablet 1   No current facility-administered medications for this encounter.    Allergies:   Penicillins   Social History:  The patient  reports that he has never smoked. He has never used smokeless tobacco. He reports that he does not drink alcohol and does not use drugs.   Family History:  The patient's  family history includes Alzheimer's disease in his father and mother; Cancer - Other in his sister; Heart attack in his father; Heart disease in his father.    ROS:  Please see the history of present illness.   All other systems are personally reviewed and negative.   Exam: Well appearing, alert and conversant, regular work of breathing,  good skin color Eyes- anicteric, neuro- grossly intact, skin- no apparent rash or lesions or cyanosis, mouth- oral mucosa is pink  Recent Labs: 10/07/2019: TSH 1.659 08/05/2020: ALT 16; Platelets 232 08/25/2020: BUN 92; Creatinine, Ser 9.80; Hemoglobin 10.5; Potassium 4.0; Sodium 139  personally reviewed    EKG today demonstrates  Sinus tachycardia Vent. rate 116 BPM PR interval 140 ms QRS duration 90 ms QT/QTc 356/494 ms  Echo 02/26/19 demonstrated  1. The left ventricle has normal systolic function with an ejection fraction of 60-65%. The cavity size was normal. Left ventricular diastolic Doppler parameters are consistent with impaired relaxation. 2. The right ventricle has normal systolic function. The cavity was normal. There is no increase in right ventricular wall thickness. 3. Left atrial size was mildly dilated. 4.  The aortic valve is tricuspid. Mild thickening of the aortic valve. Mild calcification of the aortic valve.  Epic records are reviewed at length today   CHA2DS2-VASc Score = 3  The patient's score is based upon: CHF History: No HTN History: Yes Diabetes History: Yes Stroke History: No Vascular Disease History: Yes Age Score: 0 Gender Score: 0      ASSESSMENT AND PLAN: 1. Paroxysmal Atrial Fibrillation (ICD10:  I48.0) The patient's CHA2DS2-VASc score is 3, indicating a 3.2% annual risk of stroke.   Patient in sinus tach today. Unable to use rate control medications due to hypotension.  Increase amiodarone to 200 mg daily.  We discussed  anticoagulation at length today. Given that he is on the renal transplant list, there is concern that he would not be able to have surgery because of warfarin. ? If warfarin can be quickly reversed with Vit K to allow him to have surgery? Patient is currently undergoing surgeries for pancreas and lung masses. If these are malignant, he would not be a candidate for transplant at this time. Will plan to continue warfarin (currently on hold for pancreas procedure) for now until we know more about his transplant status. Patient in agreement with plan.  2. Secondary Hypercoagulable State (ICD10:  D68.69) The patient is at significant risk for stroke/thromboembolism based upon his CHA2DS2-VASc Score of 3.  Continue Warfarin (Coumadin).   3. Obesity Body mass index is 38.13 kg/m. Lifestyle modification was discussed and encouraged including regular physical activity and weight reduction.  4. OSA Patient reports compliance with CPAP therapy.  5. HTN Stable, no changes today.  6. ESRD HD TTS   Follow-up with Dr Margaretann Loveless as scheduled. AF clinic in 3 months.   Current medicines are reviewed at length with the patient today.   The patient has concerns regarding his medicines. These were addressed as above. The following changes were made today:   Increase amiodarone   Labs/ tests ordered today include:  Orders Placed This Encounter  Procedures  . EKG 12-Lead    Patient Risk:  after full review of this patients clinical status, I feel that they are at moderate risk at this time.   Today, I have spent 20 minutes with the patient with telehealth technology discussing the above.    Gwenlyn Perking PA-C 09/30/2020 9:22 AM  [facility] [address] [place], [place] [address] [phone]   I hereby voluntarily request, consent and authorize the [facility] and its employed or contracted physicians, physician assistants, nurse practitioners or other licensed health care professionals (the Practitioner), to provide me with telemedicine health care services (the "Services") as deemed necessary by the treating Practitioner. I acknowledge and consent to receive the Services by the Practitioner via telemedicine. I understand that the telemedicine visit will involve communicating with the Practitioner through live audiovisual communication technology and the disclosure of certain medical information by electronic transmission. I acknowledge that I have been given the opportunity to request an in-person assessment or other available alternative prior to the telemedicine visit and am voluntarily participating in the telemedicine visit.   I understand that I have the right to withhold or withdraw my consent to the use of telemedicine in the course of my care at any time, without affecting my right to future care or treatment, and that the Practitioner or I may terminate the telemedicine visit at any time. I understand that I have the right to inspect all information obtained and/or recorded in the course of the telemedicine visit and may receive copies of available information for a reasonable fee.  I understand that some of the potential risks of receiving the Services via telemedicine include:   Delay or  interruption in medical evaluation due to technological equipment failure or disruption;  Information transmitted may not be sufficient (e.g. poor resolution of images) to allow for appropriate medical decision making by the Practitioner; and/or  In rare instances, security protocols could fail, causing a breach of personal health information.   Furthermore, I acknowledge that it is my responsibility to provide information about my medical history, conditions and care that is complete and accurate to the best  of my ability. I acknowledge that Practitioner's advice, recommendations, and/or decision may be based on factors not within their control, such as incomplete or inaccurate data provided by me or distortions of diagnostic images or specimens that may result from electronic transmissions. I understand that the practice of medicine is not an exact science and that Practitioner makes no warranties or guarantees regarding treatment outcomes. I acknowledge that I will receive a copy of this consent concurrently upon execution via email to the email address I last provided but may also request a printed copy by calling the office of the [facility].  I understand that my insurance will be billed for this visit.   I have read or had this consent read to me.  I understand the contents of this consent, which adequately explains the benefits and risks of the Services being provided via telemedicine.  I have been provided ample opportunity to ask questions regarding this consent and the Services and have had my questions answered to my satisfaction.  I give my informed consent for the services to be provided through the use of telemedicine in my medical care  By participating in this telemedicine visit I agree to the above.

## 2020-09-30 NOTE — Progress Notes (Signed)
Patient is being seen in the Atrial Fibrillation Clinic. VS, EKG, and device interrogations performed in the clinic. Clint Fenton, PA is at home and seeing patients via telemedicine as he is in quarantine.

## 2020-10-05 HISTORY — PX: EUS: SHX5427

## 2020-10-12 ENCOUNTER — Ambulatory Visit (INDEPENDENT_AMBULATORY_CARE_PROVIDER_SITE_OTHER): Payer: Medicare Other | Admitting: Thoracic Surgery (Cardiothoracic Vascular Surgery)

## 2020-10-12 ENCOUNTER — Other Ambulatory Visit: Payer: Self-pay

## 2020-10-12 ENCOUNTER — Other Ambulatory Visit: Payer: Self-pay | Admitting: *Deleted

## 2020-10-12 ENCOUNTER — Encounter: Payer: Self-pay | Admitting: Thoracic Surgery (Cardiothoracic Vascular Surgery)

## 2020-10-12 VITALS — BP 101/69 | HR 116 | Temp 97.8°F | Resp 20 | Ht 73.0 in | Wt 293.8 lb

## 2020-10-12 DIAGNOSIS — R911 Solitary pulmonary nodule: Secondary | ICD-10-CM

## 2020-10-12 DIAGNOSIS — R918 Other nonspecific abnormal finding of lung field: Secondary | ICD-10-CM

## 2020-10-12 NOTE — Progress Notes (Signed)
[address]       [place] [address]             [phone]    HPI:  George Lewis returns for scheduled follow-up visit regarding his lung nodules.  George Lewis is a 65 year old man with a past medical history significant for renal cell carcinoma, pancreatic abnormality on PET, bilateral lung nodules, history of pulmonary embolus 12 years ago, obesity, poorly controlled atrial fibrillation with rapid ventricular response, chronic anticoagulation, obstructive sleep apnea, end-stage renal disease on hemodialysis, type 2 diabetes, reflux, and arthritis.  He had a right nephrectomy for stage IV renal cell carcinoma in 2019.  He has been followed with CTs since then.  He has had 2 lung nodules one in the right lower lobe and the other one in the lingula.  Those have slowly increased in size over time.  Recently a PET/CT showed a hypermetabolic 1 cm mass in the pancreas and an SUV of 3.8 in the lingular nodule and an SUV of 1.6 in the right lower lobe nodule.  Dr. Valeta Harms did bronchoscopy and biopsy of the lung nodules which was nondiagnostic.  He had an EUS of his pancreatic lesion.  No suspicious nodules were identified.  He is back on Coumadin.  He had a virtual visit with cardiology recently.  His amiodarone was increased from 100 to 200 mg daily.  Zubrod Score: At the time of surgery this patient's most appropriate activity status/level should be described as: '[]'     0    Normal activity, no symptoms '[x]'     1    Restricted in physical strenuous activity but ambulatory, able to do out light work '[]'     2    Ambulatory and capable of self care, unable to do work activities, up and about >50 % of waking hours                              '[]'     3    Only limited self care, in bed greater than 50% of waking hours '[]'     4    Completely disabled, no self care, confined to bed or chair '[]'     5    Moribund  Past Medical History:  Diagnosis Date  . Anemia   .  Arthritis    knees  . Atrial fibrillation ([place])   . Bilateral renal cysts 03/23/2018   Noted MRI ABD  . Cancer [facility])    right kidney cancer  . Cancer [facility])    pancreatic  . Cholelithiasis 03/19/2018   noted on CT AB/Pelvis  . CKD (chronic kidney disease), stage IV [facility])    nephrologist-- dr Hollie Salk [name]);  05-01-2018 has not started dialysis, scheduled for AV fistula creation 05-07-2018  . Diabetes mellitus without complication ([place])   . Diverticulosis of colon 04/19/2018   Noted on CT abd/pelvis  . GERD (gastroesophageal reflux disease)   . Hepatic steatosis 03/19/2018   Mild diffuse, noted on CT AB/Pelvis  . History of kidney stones   . History of pulmonary embolus (PE) 03/2008   treated with coumadin for 6 months  . History of sepsis 04/20/2018   per discharge note , probable UTI  . Hypertension   . IBS (irritable bowel syndrome)   . Nocturia   . OSA on CPAP    uses CPAP nightly  . Pancreatic lesion  0.4cm cystic per CT 07/ 2019  . Peripheral vascular disease ([place])   . Pneumonia    x 1  . Pulmonary nodule    Solid 20m Right Lower Lobe  . Right renal mass 04/10/2018   new dx--  scheduled for nephrectomy 05-16-2018  . S/P ureteral stent placement: 04/16/2018 04/19/2018   Past Surgical History:  Procedure Laterality Date  . AV FISTULA PLACEMENT Left 05/07/2018   Procedure: Creation of Left arm Radiocephalic Fistula;  Surgeon: CMarty Heck MD;  [place]  Service: Vascular;  Laterality: Left;  . AV FISTULA PLACEMENT Left 05/15/2019   Procedure: Creation of Brachiocephalic fistula left arm;  Surgeon: CWaynetta Sandy MD;  [place]  Service: Vascular;  Laterality: Left;  . BASCILIC VEIN TRANSPOSITION Left 07/15/2019   Procedure: BASILIC VEIN TRANSPOSITION SECOND STAGE;  Surgeon: CWaynetta Sandy MD;  [place]  Service: Vascular;  Laterality: Left;  . BRONCHIAL BRUSHINGS  08/25/2020   Procedure: BRONCHIAL  BRUSHINGS;  Surgeon: IGarner Nash DO;  [facility];  Service: Pulmonary;;  . BRONCHIAL NEEDLE ASPIRATION BIOPSY  08/25/2020   Procedure: BRONCHIAL NEEDLE ASPIRATION BIOPSIES;  Surgeon: IGarner Nash DO;  [facility];  Service: Pulmonary;;  . BRONCHIAL WASHINGS  08/25/2020   Procedure: BRONCHIAL WASHINGS;  Surgeon: IGarner Nash DO;  [place];  Service: Pulmonary;;  . COLONOSCOPY    . CYSTOSCOPY/RETROGRADE/URETEROSCOPY/STONE EXTRACTION WITH BASKET  x2 last one 1990s approx.  . CYSTOSCOPY/URETEROSCOPY/HOLMIUM LASER/STENT PLACEMENT Left 04/16/2018   Procedure: CYSTOSCOPY/LEFT URETEROSCOPY/LEFT RETROGRADE/STENT PLACEMENT;  Surgeon: BLucas Mallow MD;  [facility];  Service: Urology;  Laterality: Left;  . EUS N/A 05/03/2018   Procedure: UPPER ENDOSCOPIC ULTRASOUND (EUS) RADIAL;  Surgeon: JMilus Banister MD;  [facility];  Service: Gastroenterology;  Laterality: N/A;  . EUS N/A 05/03/2018   Procedure: UPPER ENDOSCOPIC ULTRASOUND (EUS) LINEAR;  Surgeon: JMilus Banister MD;  [facility];  Service: Gastroenterology;  Laterality: N/A;  . EXTRACORPOREAL SHOCK WAVE LITHOTRIPSY  x3  last one 2004 approx.  .Marland KitchenFINE NEEDLE ASPIRATION N/A 05/03/2018   Procedure: FINE NEEDLE ASPIRATION (FNA) LINEAR;  Surgeon: JMilus Banister MD;  [facility];  Service: Gastroenterology;  Laterality: N/A;  . LAPAROSCOPIC NEPHRECTOMY, HAND ASSISTED Right 05/16/2018   Procedure: HAND ASSISTED LAPAROSCOPIC RIGHT NEPHRECTOMY;  Surgeon: BLucas Mallow MD;  [facility];  Service: Urology;  Laterality: Right;  . left index finger attachment  1980s   3-4 surgeries  . TOE SURGERY  1980s   in beween 2nd and 3rd toes cyst removed  . UPPER GI ENDOSCOPY    . VIDEO BRONCHOSCOPY WITH ENDOBRONCHIAL NAVIGATION Bilateral 08/25/2020   Procedure: VIDEO BRONCHOSCOPY WITH ENDOBRONCHIAL NAVIGATION;  Surgeon: IGarner Nash DO;  [place]  Service: Pulmonary;  Laterality: Bilateral;    Current Outpatient Medications  Medication Sig Dispense Refill  . Accu-Chek FastClix Lancets MISC Use to check fasting blood sugar and 2 hrs after largest meal. 100 each 0  . ACCU-CHEK GUIDE test strip USE 1 STRIP TO CHECK FASTING BLOOD SUGAR AND 2 HOURS AFTER LARGEST MEAL 100 each 3  . acetaminophen (TYLENOL) 500 MG tablet Take 1,000 mg by mouth as needed for mild pain or headache.     . allopurinol (ZYLOPRIM) 100 MG tablet Take 1 tablet (100 mg total) by mouth every evening. 30 tablet 0  . amiodarone (PACERONE) 200 MG tablet Take 1 tablet (200 mg total) by  mouth daily. 90 tablet 1  . atorvastatin (LIPITOR) 40 MG tablet Take 1 tablet (40 mg total) by mouth daily. (Patient taking differently: Take 40 mg by mouth every evening.) 30 tablet 0  . AURYXIA 1 GM 210 MG(Fe) tablet Take 210 mg by mouth every evening.    . blood glucose meter kit and supplies Dispense based on patient and insurance preference. Use up to four times daily as directed. (FOR ICD-10 E10.9, E11.9). 1 each 0  . diphenhydramine-acetaminophen (TYLENOL PM) 25-500 MG TABS tablet Take 2 tablets by mouth at bedtime as needed (sleep).    . fluticasone (FLONASE) 50 MCG/ACT nasal spray Place 1-2 sprays into both nostrils as needed for allergies or rhinitis.     Marland Kitchen loratadine (CLARITIN) 10 MG tablet Take 10 mg by mouth every evening.     . midodrine (PROAMATINE) 10 MG tablet Taking 1 tablet in the am Tuesday, Thursday and Saturday and then 1 tablet half way through his treatment- Only on Dialysis days    . nitroGLYCERIN (NITROSTAT) 0.4 MG SL tablet Place 1 tablet (0.4 mg total) under the tongue every 5 (five) minutes as needed for chest pain. 14 tablet 12  . omeprazole (PRILOSEC OTC) 20 MG tablet Take 40 mg by mouth daily before breakfast.     . Sennosides (SENOKOT EXTRA STRENGTH) 17.2 MG TABS Take 17.2 mg by mouth. Taking by mouth 2-3 times daily    . warfarin (COUMADIN) 3 MG  tablet Take 1 to 1.5 tablets by mouth daily as directed by coumadin clinic 45 tablet 2   No current facility-administered medications for this visit.    Physical Exam BP 101/69 (BP Location: Right Arm, Patient Position: Sitting, Cuff Size: Large)   Pulse (!) 116   Temp 97.8 F (36.6 C) (Skin)   Resp 20   Ht '6\' 1"'  (1.854 m)   Wt 293 lb 12.8 oz (133.3 kg)   SpO2 98% Comment: RA  BMI 38.76 kg/m  Obese 65 year old man in no acute distress Alert and oriented x3 with no focal deficits No cervical or supraclavicular adenopathy Cardiac tachycardic and irregularly irregular, no murmur Lungs clear bilaterally No peripheral edema   Diagnostic Tests: I again reviewed his CT and PET/CT.  Surprisingly no suspicious nodules are seen in the pancreas given the degree of hypermetabolism in that area.  The lung nodules are as described in the H&P.  The lingular nodule is larger and more hypermetabolic than the right lower lobe nodule.   Impression: George "Kennyth Lose "Lewis is a 65 year old man with complicated medical history.  His past medical history significant for renal cell carcinoma, pancreatic abnormality on PET, bilateral lung nodules, history of pulmonary embolus 12 years ago, obesity, poorly controlled atrial fibrillation with rapid ventricular response, chronic anticoagulation, obstructive sleep apnea, end-stage renal disease on hemodialysis, type 2 diabetes, reflux, and arthritis.  He has bilateral lung nodules.  There is a lingular nodule is about 1.4 cm and has an SUV of 3.8.  There is a smaller right lower lobe nodule that has an SUV of 1.6.  These most likely are the same process.  Differential diagnosis includes metastatic renal cell carcinoma, primary bronchogenic carcinoma, infectious and inflammatory nodules.  Navigational bronchoscopy was not successful in establishing a diagnosis.  We again discussed the possibility of a robotic assisted left VATS for wedge resection of the  lingular nodule.  I informed George Lewis of the indications, risks, benefits, and alternatives.  We again discussed the general nature of the  procedure including the need for general anesthesia, the incisions to be used, the use of a drainage tube postoperatively, the expected hospital stay, and the overall recovery.  They understand the risks include, but not limited to death, MI, DVT, PE, bleeding, possible need for transfusion, infection, prolonged air leak, cardiac arrhythmias, as well as possibility of other unforeseeable complications.  They understand he is a high risk patient due to his renal failure, poorly controlled atrial fibrillation, obesity, and other medical issues.  He accepts the risk and agrees to proceed.  He is back on Coumadin but has not taken it today.  We have not availability in the schedule for this Friday.  He is dialyzed on Tuesdays Thursdays and Saturdays so that should work out well.  He does wish to proceed on Friday, 10/16/2020  Plan: Robotic left VATS for wedge resection of lingular nodule on Friday, 10/16/2020 Hold Coumadin  I spent 34 minutes in review of records, images, and in consultation with George Lewis today. Melrose Nakayama, MD [facility] [phone]

## 2020-10-12 NOTE — H&P (View-Only) (Signed)
[address]       [place] [address]             [phone]    HPI:  George Lewis returns for scheduled follow-up visit regarding his lung nodules.  George Lewis is a 65 year old man with a past medical history significant for renal cell carcinoma, pancreatic abnormality on PET, bilateral lung nodules, history of pulmonary embolus 12 years ago, obesity, poorly controlled atrial fibrillation with rapid ventricular response, chronic anticoagulation, obstructive sleep apnea, end-stage renal disease on hemodialysis, type 2 diabetes, reflux, and arthritis.  He had a right nephrectomy for stage IV renal cell carcinoma in 2019.  He has been followed with CTs since then.  He has had 2 lung nodules one in the right lower lobe and the other one in the lingula.  Those have slowly increased in size over time.  Recently a PET/CT showed a hypermetabolic 1 cm mass in the pancreas and an SUV of 3.8 in the lingular nodule and an SUV of 1.6 in the right lower lobe nodule.  Dr. Valeta Harms did bronchoscopy and biopsy of the lung nodules which was nondiagnostic.  He had an EUS of his pancreatic lesion.  No suspicious nodules were identified.  He is back on Coumadin.  He had a virtual visit with cardiology recently.  His amiodarone was increased from 100 to 200 mg daily.  Zubrod Score: At the time of surgery this patient's most appropriate activity status/level should be described as: '[]'     0    Normal activity, no symptoms '[x]'     1    Restricted in physical strenuous activity but ambulatory, able to do out light work '[]'     2    Ambulatory and capable of self care, unable to do work activities, up and about >50 % of waking hours                              '[]'     3    Only limited self care, in bed greater than 50% of waking hours '[]'     4    Completely disabled, no self care, confined to bed or chair '[]'     5    Moribund  Past Medical History:  Diagnosis Date  . Anemia   .  Arthritis    knees  . Atrial fibrillation ([place])   . Bilateral renal cysts 03/23/2018   Noted MRI ABD  . Cancer [facility])    right kidney cancer  . Cancer [facility])    pancreatic  . Cholelithiasis 03/19/2018   noted on CT AB/Pelvis  . CKD (chronic kidney disease), stage IV [facility])    nephrologist-- dr Hollie Salk [name]);  05-01-2018 has not started dialysis, scheduled for AV fistula creation 05-07-2018  . Diabetes mellitus without complication ([place])   . Diverticulosis of colon 04/19/2018   Noted on CT abd/pelvis  . GERD (gastroesophageal reflux disease)   . Hepatic steatosis 03/19/2018   Mild diffuse, noted on CT AB/Pelvis  . History of kidney stones   . History of pulmonary embolus (PE) 03/2008   treated with coumadin for 6 months  . History of sepsis 04/20/2018   per discharge note , probable UTI  . Hypertension   . IBS (irritable bowel syndrome)   . Nocturia   . OSA on CPAP    uses CPAP nightly  . Pancreatic lesion  0.4cm cystic per CT 07/ 2019  . Peripheral vascular disease ([place])   . Pneumonia    x 1  . Pulmonary nodule    Solid 81m Right Lower Lobe  . Right renal mass 04/10/2018   new dx--  scheduled for nephrectomy 05-16-2018  . S/P ureteral stent placement: 04/16/2018 04/19/2018   Past Surgical History:  Procedure Laterality Date  . AV FISTULA PLACEMENT Left 05/07/2018   Procedure: Creation of Left arm Radiocephalic Fistula;  Surgeon: CMarty Heck MD;  [place]  Service: Vascular;  Laterality: Left;  . AV FISTULA PLACEMENT Left 05/15/2019   Procedure: Creation of Brachiocephalic fistula left arm;  Surgeon: CWaynetta Sandy MD;  [place]  Service: Vascular;  Laterality: Left;  . BASCILIC VEIN TRANSPOSITION Left 07/15/2019   Procedure: BASILIC VEIN TRANSPOSITION SECOND STAGE;  Surgeon: CWaynetta Sandy MD;  [place] [facility]  Service: Vascular;  Laterality: Left;  . BRONCHIAL BRUSHINGS  08/25/2020   Procedure: BRONCHIAL  BRUSHINGS;  Surgeon: IGarner Nash DO;  [facility];  Service: Pulmonary;;  . BRONCHIAL NEEDLE ASPIRATION BIOPSY  08/25/2020   Procedure: BRONCHIAL NEEDLE ASPIRATION BIOPSIES;  Surgeon: IGarner Nash DO;  [facility];  Service: Pulmonary;;  . BRONCHIAL WASHINGS  08/25/2020   Procedure: BRONCHIAL WASHINGS;  Surgeon: IGarner Nash DO;  [facility];  Service: Pulmonary;;  . COLONOSCOPY    . CYSTOSCOPY/RETROGRADE/URETEROSCOPY/STONE EXTRACTION WITH BASKET  x2 last one 1990s approx.  . CYSTOSCOPY/URETEROSCOPY/HOLMIUM LASER/STENT PLACEMENT Left 04/16/2018   Procedure: CYSTOSCOPY/LEFT URETEROSCOPY/LEFT RETROGRADE/STENT PLACEMENT;  Surgeon: BLucas Mallow MD;  [facility];  Service: Urology;  Laterality: Left;  . EUS N/A 05/03/2018   Procedure: UPPER ENDOSCOPIC ULTRASOUND (EUS) RADIAL;  Surgeon: JMilus Banister MD;  [facility];  Service: Gastroenterology;  Laterality: N/A;  . EUS N/A 05/03/2018   Procedure: UPPER ENDOSCOPIC ULTRASOUND (EUS) LINEAR;  Surgeon: JMilus Banister MD;  [facility];  Service: Gastroenterology;  Laterality: N/A;  . EXTRACORPOREAL SHOCK WAVE LITHOTRIPSY  x3  last one 2004 approx.  .Marland KitchenFINE NEEDLE ASPIRATION N/A 05/03/2018   Procedure: FINE NEEDLE ASPIRATION (FNA) LINEAR;  Surgeon: JMilus Banister MD;  [facility];  Service: Gastroenterology;  Laterality: N/A;  . LAPAROSCOPIC NEPHRECTOMY, HAND ASSISTED Right 05/16/2018   Procedure: HAND ASSISTED LAPAROSCOPIC RIGHT NEPHRECTOMY;  Surgeon: BLucas Mallow MD;  [facility];  Service: Urology;  Laterality: Right;  . left index finger attachment  1980s   3-4 surgeries  . TOE SURGERY  1980s   in beween 2nd and 3rd toes cyst removed  . UPPER GI ENDOSCOPY    . VIDEO BRONCHOSCOPY WITH ENDOBRONCHIAL NAVIGATION Bilateral 08/25/2020   Procedure: VIDEO BRONCHOSCOPY WITH ENDOBRONCHIAL NAVIGATION;  Surgeon: IGarner Nash DO;  [place]  Service: Pulmonary;  Laterality: Bilateral;    Current Outpatient Medications  Medication Sig Dispense Refill  . Accu-Chek FastClix Lancets MISC Use to check fasting blood sugar and 2 hrs after largest meal. 100 each 0  . ACCU-CHEK GUIDE test strip USE 1 STRIP TO CHECK FASTING BLOOD SUGAR AND 2 HOURS AFTER LARGEST MEAL 100 each 3  . acetaminophen (TYLENOL) 500 MG tablet Take 1,000 mg by mouth as needed for mild pain or headache.     . allopurinol (ZYLOPRIM) 100 MG tablet Take 1 tablet (100 mg total) by mouth every evening. 30 tablet 0  . amiodarone (PACERONE) 200 MG tablet Take 1 tablet (200 mg total) by  mouth daily. 90 tablet 1  . atorvastatin (LIPITOR) 40 MG tablet Take 1 tablet (40 mg total) by mouth daily. (Patient taking differently: Take 40 mg by mouth every evening.) 30 tablet 0  . AURYXIA 1 GM 210 MG(Fe) tablet Take 210 mg by mouth every evening.    . blood glucose meter kit and supplies Dispense based on patient and insurance preference. Use up to four times daily as directed. (FOR ICD-10 E10.9, E11.9). 1 each 0  . diphenhydramine-acetaminophen (TYLENOL PM) 25-500 MG TABS tablet Take 2 tablets by mouth at bedtime as needed (sleep).    . fluticasone (FLONASE) 50 MCG/ACT nasal spray Place 1-2 sprays into both nostrils as needed for allergies or rhinitis.     Marland Kitchen loratadine (CLARITIN) 10 MG tablet Take 10 mg by mouth every evening.     . midodrine (PROAMATINE) 10 MG tablet Taking 1 tablet in the am Tuesday, Thursday and Saturday and then 1 tablet half way through his treatment- Only on Dialysis days    . nitroGLYCERIN (NITROSTAT) 0.4 MG SL tablet Place 1 tablet (0.4 mg total) under the tongue every 5 (five) minutes as needed for chest pain. 14 tablet 12  . omeprazole (PRILOSEC OTC) 20 MG tablet Take 40 mg by mouth daily before breakfast.     . Sennosides (SENOKOT EXTRA STRENGTH) 17.2 MG TABS Take 17.2 mg by mouth. Taking by mouth 2-3 times daily    . warfarin (COUMADIN) 3 MG  tablet Take 1 to 1.5 tablets by mouth daily as directed by coumadin clinic 45 tablet 2   No current facility-administered medications for this visit.    Physical Exam BP 101/69 (BP Location: Right Arm, Patient Position: Sitting, Cuff Size: Large)   Pulse (!) 116   Temp 97.8 F (36.6 C) (Skin)   Resp 20   Ht '6\' 1"'  (1.854 m)   Wt 293 lb 12.8 oz (133.3 kg)   SpO2 98% Comment: RA  BMI 38.76 kg/m  Obese 65 year old man in no acute distress Alert and oriented x3 with no focal deficits No cervical or supraclavicular adenopathy Cardiac tachycardic and irregularly irregular, no murmur Lungs clear bilaterally No peripheral edema   Diagnostic Tests: I again reviewed his CT and PET/CT.  Surprisingly no suspicious nodules are seen in the pancreas given the degree of hypermetabolism in that area.  The lung nodules are as described in the H&P.  The lingular nodule is larger and more hypermetabolic than the right lower lobe nodule.   Impression: George Lewis is a 65 year old man with complicated medical history.  His past medical history significant for renal cell carcinoma, pancreatic abnormality on PET, bilateral lung nodules, history of pulmonary embolus 12 years ago, obesity, poorly controlled atrial fibrillation with rapid ventricular response, chronic anticoagulation, obstructive sleep apnea, end-stage renal disease on hemodialysis, type 2 diabetes, reflux, and arthritis.  He has bilateral lung nodules.  There is a lingular nodule is about 1.4 cm and has an SUV of 3.8.  There is a smaller right lower lobe nodule that has an SUV of 1.6.  These most likely are the same process.  Differential diagnosis includes metastatic renal cell carcinoma, primary bronchogenic carcinoma, infectious and inflammatory nodules.  Navigational bronchoscopy was not successful in establishing a diagnosis.  We again discussed the possibility of a robotic assisted left VATS for wedge resection of the  lingular nodule.  I informed George Lewis of the indications, risks, benefits, and alternatives.  We again discussed the general nature of the  procedure including the need for general anesthesia, the incisions to be used, the use of a drainage tube postoperatively, the expected hospital stay, and the overall recovery.  They understand the risks include, but not limited to death, MI, DVT, PE, bleeding, possible need for transfusion, infection, prolonged air leak, cardiac arrhythmias, as well as possibility of other unforeseeable complications.  They understand he is a high risk patient due to his renal failure, poorly controlled atrial fibrillation, obesity, and other medical issues.  He accepts the risk and agrees to proceed.  He is back on Coumadin but has not taken it today.  We have not availability in the schedule for this Friday.  He is dialyzed on Tuesdays Thursdays and Saturdays so that should work out well.  He does wish to proceed on Friday, 10/16/2020  Plan: Robotic left VATS for wedge resection of lingular nodule on Friday, 10/16/2020 Hold Coumadin  I spent 34 minutes in review of records, images, and in consultation with Mr. Dowson today. Melrose Nakayama, MD [facility] [phone]

## 2020-10-13 NOTE — Progress Notes (Addendum)
Surgical Instructions    Your procedure is scheduled on Friday, February 18th.  Report to [facility] Main Entrance "A" at 5:30 A.M., then check in with the Admitting office.  Call this number if you have problems the morning of surgery:  [phone]   If you have any questions prior to your surgery date call [phone]: Open Monday-Friday 8am-4pm    Remember:  Do not eat or drink after midnight the night before your surgery     Take these medicines the morning of surgery with A SIP OF WATER   Tylenol - if needed  Amiodarone (Pacerone)  Flonase nasal spray - if needed  Omeprazole (Prilosec)   Follow your surgeon's instructions on when to stop Warfarin (Coumadin)  If no instructions were given by your surgeon then you will need to call the office to get those instructions.    As of today, STOP taking any Aspirin (unless otherwise instructed by your surgeon) Aleve, Naproxen, Ibuprofen, Motrin, Advil, Goody's, BC's, all herbal medications, fish oil, and all vitamins.   HOW TO MANAGE YOUR DIABETES BEFORE AND AFTER SURGERY  Why is it important to control my blood sugar before and after surgery? . Improving blood sugar levels before and after surgery helps healing and can limit problems. . A way of improving blood sugar control is eating a healthy diet by: o  Eating less sugar and carbohydrates o  Increasing activity/exercise o  Talking with your doctor about reaching your blood sugar goals . High blood sugars (greater than 180 mg/dL) can raise your risk of infections and slow your recovery, so you will need to focus on controlling your diabetes during the weeks before surgery. . Make sure that the doctor who takes care of your diabetes knows about your planned surgery including the date and location.  How do I manage my blood sugar before surgery? . Check your blood sugar at least 4 times a day, starting 2 days before surgery, to make sure that the level is not too high or  low. . Check your blood sugar the morning of your surgery when you wake up and every 2 hours until you get to the Short Stay unit. o If your blood sugar is less than 70 mg/dL, you will need to treat for low blood sugar: - Do not take insulin. - Treat a low blood sugar (less than 70 mg/dL) with  cup of clear juice (cranberry or apple), 4 glucose tablets, OR glucose gel. - Recheck blood sugar in 15 minutes after treatment (to make sure it is greater than 70 mg/dL). If your blood sugar is not greater than 70 mg/dL on recheck, call [phone] for further instructions. . Report your blood sugar to the short stay nurse when you get to Short Stay.  . If you are admitted to the hospital after surgery: o Your blood sugar will be checked by the staff and you will probably be given insulin after surgery (instead of oral diabetes medicines) to make sure you have good blood sugar levels. o The goal for blood sugar control after surgery is 80-180 mg/dL.               DAY OF SURGERY:            Do not wear jewelry            Do not wear lotions, powders, colognes, or deodorant.            Men may shave face and neck.  Do not bring valuables to the hospital.            [facility] is not responsible for any belongings or valuables.  Do NOT Smoke (Tobacco/Vaping) or drink Alcohol 24 hours prior to your procedure If you use a CPAP at night, you may bring all equipment for your overnight stay.   Contacts, glasses, dentures or bridgework may not be worn into surgery, please bring cases for these belongings   For patients admitted to the hospital, discharge time will be determined by your treatment team.   Patients discharged the day of surgery will not be allowed to drive home, and someone needs to stay with them for 24 hours.    Special instructions:   [place]- Preparing For Surgery  Before surgery, you can play an important role. Because skin is not sterile, your skin needs to be as  free of germs as possible. You can reduce the number of germs on your skin by washing with CHG (chlorahexidine gluconate) Soap before surgery.  CHG is an antiseptic cleaner which kills germs and bonds with the skin to continue killing germs even after washing.    Oral Hygiene is also important to reduce your risk of infection.  Remember - BRUSH YOUR TEETH THE MORNING OF SURGERY WITH YOUR REGULAR TOOTHPASTE  Please do not use if you have an allergy to CHG or antibacterial soaps. If your skin becomes reddened/irritated stop using the CHG.  Do not shave (including legs and underarms) for at least 48 hours prior to first CHG shower. It is OK to shave your face.  Please follow these instructions carefully.   1. Shower the NIGHT BEFORE SURGERY and the MORNING OF SURGERY  2. If you chose to wash your hair, wash your hair first as usual with your normal shampoo.  3. After you shampoo, rinse your hair and body thoroughly to remove the shampoo.  4. Wash Face and genitals (private parts) with your normal soap.   5.  Shower the NIGHT BEFORE SURGERY and the MORNING OF SURGERY with CHG Soap.   6. Use CHG Soap as you would any other liquid soap. You can apply CHG directly to the skin and wash gently with a scrungie or a clean washcloth.   7. Apply the CHG Soap to your body ONLY FROM THE NECK DOWN.  Do not use on open wounds or open sores. Avoid contact with your eyes, ears, mouth and genitals (private parts). Wash Face and genitals (private parts)  with your normal soap.   8. Wash thoroughly, paying special attention to the area where your surgery will be performed.  9. Thoroughly rinse your body with warm water from the neck down.  10. DO NOT shower/wash with your normal soap after using and rinsing off the CHG Soap.  11. Pat yourself dry with a CLEAN TOWEL.  12. Wear CLEAN PAJAMAS to bed the night before surgery  13. Place CLEAN SHEETS on your bed the night before your surgery  14. DO NOT SLEEP  WITH PETS.   Day of Surgery: Wear Clean/Comfortable clothing the morning of surgery Do not apply any deodorants/lotions.   Remember to brush your teeth WITH YOUR REGULAR TOOTHPASTE.   Please read over the following fact sheets that you were given.

## 2020-10-14 ENCOUNTER — Ambulatory Visit (HOSPITAL_COMMUNITY)
Admission: RE | Admit: 2020-10-14 | Discharge: 2020-10-14 | Disposition: A | Discharge status: Home / Self Care | Payer: Medicare Other | Source: Ambulatory Visit | Attending: Thoracic Surgery (Cardiothoracic Vascular Surgery) | Admitting: Thoracic Surgery (Cardiothoracic Vascular Surgery)

## 2020-10-14 ENCOUNTER — Encounter (HOSPITAL_COMMUNITY): Payer: Self-pay

## 2020-10-14 ENCOUNTER — Other Ambulatory Visit (HOSPITAL_COMMUNITY)
Admission: RE | Admit: 2020-10-14 | Discharge: 2020-10-14 | Disposition: A | Discharge status: Home / Self Care | Payer: Medicare Other | Source: Ambulatory Visit | Attending: Thoracic Surgery (Cardiothoracic Vascular Surgery) | Admitting: Thoracic Surgery (Cardiothoracic Vascular Surgery)

## 2020-10-14 ENCOUNTER — Encounter (HOSPITAL_COMMUNITY)
Admission: RE | Admit: 2020-10-14 | Discharge: 2020-10-14 | Disposition: A | Discharge status: Home / Self Care | Payer: Medicare Other | Source: Ambulatory Visit | Attending: Thoracic Surgery (Cardiothoracic Vascular Surgery) | Admitting: Thoracic Surgery (Cardiothoracic Vascular Surgery)

## 2020-10-14 ENCOUNTER — Other Ambulatory Visit: Payer: Self-pay

## 2020-10-14 ENCOUNTER — Other Ambulatory Visit: Payer: Self-pay | Admitting: *Deleted

## 2020-10-14 DIAGNOSIS — R911 Solitary pulmonary nodule: Secondary | ICD-10-CM | POA: Insufficient documentation

## 2020-10-14 DIAGNOSIS — Z20822 Contact with and (suspected) exposure to covid-19: Secondary | ICD-10-CM | POA: Insufficient documentation

## 2020-10-14 DIAGNOSIS — Z01812 Encounter for preprocedural laboratory examination: Secondary | ICD-10-CM | POA: Insufficient documentation

## 2020-10-14 DIAGNOSIS — Z01818 Encounter for other preprocedural examination: Secondary | ICD-10-CM

## 2020-10-14 LAB — URINALYSIS, ROUTINE W REFLEX MICROSCOPIC
Glucose, UA: NEGATIVE mg/dL
Ketones, ur: NEGATIVE mg/dL
Nitrite: NEGATIVE
Protein, ur: 30 mg/dL — AB
Specific Gravity, Urine: 1.025 (ref 1.005–1.030)
pH: 5 (ref 5.0–8.0)

## 2020-10-14 LAB — COMPREHENSIVE METABOLIC PANEL
ALT: 19 U/L (ref 0–44)
AST: 25 U/L (ref 15–41)
Albumin: 3.9 g/dL (ref 3.5–5.0)
Alkaline Phosphatase: 44 U/L (ref 38–126)
Anion gap: 22 — ABNORMAL HIGH (ref 5–15)
BUN: 38 mg/dL — ABNORMAL HIGH (ref 8–23)
CO2: 20 mmol/L — ABNORMAL LOW (ref 22–32)
Calcium: 8.8 mg/dL — ABNORMAL LOW (ref 8.9–10.3)
Chloride: 95 mmol/L — ABNORMAL LOW (ref 98–111)
Creatinine, Ser: 8.38 mg/dL — ABNORMAL HIGH (ref 0.61–1.24)
GFR, Estimated: 7 mL/min — ABNORMAL LOW (ref >60)
Glucose, Bld: 113 mg/dL — ABNORMAL HIGH (ref 70–99)
Potassium: 3.5 mmol/L (ref 3.5–5.1)
Sodium: 137 mmol/L (ref 135–145)
Total Bilirubin: 0.6 mg/dL (ref 0.3–1.2)
Total Protein: 7.4 g/dL (ref 6.5–8.1)

## 2020-10-14 LAB — CBC
HCT: 31.6 % — ABNORMAL LOW (ref 39.0–52.0)
Hemoglobin: 11 g/dL — ABNORMAL LOW (ref 13.0–17.0)
MCH: 32.8 pg (ref 26.0–34.0)
MCHC: 34.8 g/dL (ref 30.0–36.0)
MCV: 94.3 fL (ref 80.0–100.0)
Platelets: 279 10*3/uL (ref 150–400)
RBC: 3.35 MIL/uL — ABNORMAL LOW (ref 4.22–5.81)
RDW: 13.8 % (ref 11.5–15.5)
WBC: 12 10*3/uL — ABNORMAL HIGH (ref 4.0–10.5)
nRBC: 0 % (ref 0.0–0.2)

## 2020-10-14 LAB — BLOOD GAS, ARTERIAL
Acid-Base Excess: 1 mmol/L (ref 0.0–2.0)
Bicarbonate: 24.7 mmol/L (ref 20.0–28.0)
Drawn by: 602861
FIO2: 21
O2 Saturation: 98 %
Patient temperature: 37
pCO2 arterial: 36.7 mmHg (ref 32.0–48.0)
pH, Arterial: 7.443 (ref 7.350–7.450)
pO2, Arterial: 107 mmHg (ref 83.0–108.0)

## 2020-10-14 LAB — URINALYSIS, MICROSCOPIC (REFLEX): WBC, UA: >50 WBC/hpf (ref 0–5)

## 2020-10-14 LAB — SARS CORONAVIRUS 2 (TAT 6-24 HRS): SARS Coronavirus 2: NEGATIVE

## 2020-10-14 LAB — GLUCOSE, CAPILLARY: Glucose-Capillary: 114 mg/dL — ABNORMAL HIGH (ref 70–99)

## 2020-10-14 LAB — TYPE AND SCREEN
ABO/RH(D): A POS
Antibody Screen: NEGATIVE

## 2020-10-14 LAB — HEMOGLOBIN A1C
Hgb A1c MFr Bld: 5.5 % (ref 4.8–5.6)
Mean Plasma Glucose: 111.15 mg/dL

## 2020-10-14 LAB — SURGICAL PCR SCREEN
MRSA, PCR: NEGATIVE
Staphylococcus aureus: NEGATIVE

## 2020-10-14 MED ORDER — CIPROFLOXACIN HCL 500 MG PO TABS: 500.0000 mg | ORAL_TABLET | Freq: Every day | ORAL | 0 refills | Status: DC

## 2020-10-14 NOTE — Progress Notes (Signed)
Levonne Spiller, RN at Dr. Leonarda Salon office notified of abnormal labs

## 2020-10-14 NOTE — Progress Notes (Signed)
Per Dr. Roxan Hockey, pt's pre admission testing showed UTI. Prescription for Cipro 500mg  PO daily for 5 days sent into pt's preferred pharmacy. Pt aware. No further questions.

## 2020-10-14 NOTE — Progress Notes (Signed)
PCP - Lorrene Reid, PA Cardiologist - Dr. Margaretann Loveless; sees Malka So, PA at afib clinic Pulmonologist - Dr. Valeta Harms Endocrinology - Benjiman Core  PPM/ICD - n/a Device Orders -  Rep Notified -   Chest x-ray - 10/14/20 EKG - 09/30/20 Stress Test - 05/08/2019 ECHO - 02/26/2019 Cardiac Cath - patient denies  Sleep Study - patient states his sleep study was over 20 years ago. CPAP - wears cpap nightly  Fasting Blood Sugar - 90-115 Checks Blood Sugar 1 time a day  Blood Thinner Instructions: per Dr. Roxan Hockey, last dose of coumadin was 10/11/2020 Aspirin Instructions: continue aspirin, hold dos  ERAS Protcol - n/a PRE-SURGERY Ensure or G2-   COVID TEST- 10/14/20   Anesthesia review: yes, history of cardiac testing  Patient denies shortness of breath, fever, cough and chest pain at PAT appointment   All instructions explained to the patient, with a verbal understanding of the material. Patient agrees to go over the instructions while at home for a better understanding. Patient also instructed to self quarantine after being tested for COVID-19. The opportunity to ask questions was provided.

## 2020-10-15 MED ORDER — VANCOMYCIN HCL 1500 MG/300ML IV SOLN
1500.0000 mg | INTRAVENOUS | Status: AC
  Administered 2020-10-16: 1500 mg via INTRAVENOUS
  Filled 2020-10-15: qty 300

## 2020-10-15 NOTE — Progress Notes (Signed)
Anesthesia Chart Review:  Pertinent hx includes right renal cell carcinomas/pnephrectomy in 9/19, ESRD on HD(TTS), HTN,HLD,SVT, DM type II, OSA on CPAP,morbid obesity, and paroxysmal atrial fibrillation.   Last seen in A. fib clinic 09/30/2020 and noted to be in sinus tach at that time.  He is unable to tolerate rate control medications due to hypotension.  His amiodarone was increased from 100 mg to 200 mg daily.  Patient reported last dose Coumadin 10/11/2020.  History of DM2, last A1c 5.5 on 10/14/2018  Preop labs reviewed, consistent with ESRD.  Mild anemia with hemoglobin 11.0.  Urinalysis concerning for UTI, Dr. Roxan Hockey prescribed Cipro 500 mg daily x5 days.  EKG 09/30/2020: Sinus tachycardia.  Rate 116.  CT chest 07/29/2020: IMPRESSION: 1. Slight interval enlargement of lesion in the neck of the pancreas, potentially metastatic or related to primary pancreatic neoplasm, given slow enlargement would favor indolent neoplasm such as islet cell tumor. 2. Bilateral pulmonary nodules displaying slow enlargement over time also suspicious for metastatic disease specifically with respect to medial RIGHT lung base and in the lingula. 3. Three-vessel coronary artery disease. 4. Aortic atherosclerosis.   Nuclear stress 05/10/2019:  The left ventricular ejection fraction is mildly decreased (45-54%).  Nuclear stress EF: 52%.  There was no ST segment deviation noted during stress.  Defect 1: There is a small defect of severe severity present in the apex location.  This is a low risk study.   Abnormal, low risk stress nuclear study with significant apical thinning vs infarct; cannot exclude very mild superimposed apical lateral ischemia; EF low normal (52) with normal wall motion.  TTE 02/26/2019: 1. The left ventricle has normal systolic function with an ejection  fraction of 60-65%. The cavity size was normal. Left ventricular diastolic  Doppler parameters are consistent  with impaired relaxation.  2. The right ventricle has normal systolic function. The cavity was  normal. There is no increase in right ventricular wall thickness.  3. Left atrial size was mildly dilated.  4. The aortic valve is tricuspid. Mild thickening of the aortic valve.  Mild calcification of the aortic valve.    Wynonia Musty [facility] Short Stay Center/Anesthesiology Phone [phone] 10/15/2020 9:23 AM

## 2020-10-15 NOTE — Anesthesia Preprocedure Evaluation (Addendum)
Anesthesia Evaluation  Patient identified by MRN, date of birth, ID band Patient awake    Reviewed: Allergy & Precautions, NPO status , Patient's Chart, lab work & pertinent test results  Airway Mallampati: III  TM Distance: >3 FB Neck ROM: Full    Dental no notable dental hx.    Pulmonary sleep apnea and Continuous Positive Airway Pressure Ventilation , PE   Pulmonary exam normal breath sounds clear to auscultation       Cardiovascular + Peripheral Vascular Disease  + dysrhythmias  Rhythm:Regular Rate:Tachycardia  ECG: ST, rate 116    Neuro/Psych negative neurological ROS  negative psych ROS   GI/Hepatic Neg liver ROS, GERD  Medicated and Controlled,  Endo/Other  diabetes  Renal/GU ESRF and DialysisRenal diseaseON HD T, R, Sat     Musculoskeletal  (+) Arthritis , Gout   Abdominal (+) + obese,   Peds  Hematology  (+) anemia , HLD   Anesthesia Other Findings LUL LUNG NODULES  Reproductive/Obstetrics                           Anesthesia Physical Anesthesia Plan  ASA: III  Anesthesia Plan: General   Post-op Pain Management:    Induction: Intravenous  PONV Risk Score and Plan: 2 and Ondansetron, Dexamethasone and Midazolam  Airway Management Planned: Double Lumen EBT  Additional Equipment: Arterial line  Intra-op Plan:   Post-operative Plan: Extubation in OR  Informed Consent: I have reviewed the patients History and Physical, chart, labs and discussed the procedure including the risks, benefits and alternatives for the proposed anesthesia with the patient or authorized representative who has indicated his/her understanding and acceptance.     Dental advisory given  Plan Discussed with: CRNA  Anesthesia Plan Comments: (PAT note by Karoline Caldwell, PA-C: Pertinent hx includes right renal cell carcinomas/pnephrectomy in 9/19, ESRD on HD(TTS), HTN,HLD,SVT, DM type II, OSA on  CPAP,morbid obesity, and paroxysmal atrial fibrillation.   Last seen in A. fib clinic 09/30/2020 and noted to be in sinus tach at that time.  He is unable to tolerate rate control medications due to hypotension.  His amiodarone was increased from 100 mg to 200 mg daily.  Patient reported last dose Coumadin 10/11/2020.  History of DM2, last A1c 5.5 on 10/14/2018  Preop labs reviewed, consistent with ESRD.  Mild anemia with hemoglobin 11.0.  Urinalysis concerning for UTI, Dr. Roxan Hockey prescribed Cipro 500 mg daily x5 days.  EKG 09/30/2020: Sinus tachycardia.  Rate 116.  CT chest 07/29/2020: IMPRESSION: 1. Slight interval enlargement of lesion in the neck of the pancreas, potentially metastatic or related to primary pancreatic neoplasm, given slow enlargement would favor indolent neoplasm such as islet cell tumor. 2. Bilateral pulmonary nodules displaying slow enlargement over time also suspicious for metastatic disease specifically with respect to medial RIGHT lung base and in the lingula. 3. Three-vessel coronary artery disease. 4. Aortic atherosclerosis.   Nuclear stress 05/10/2019: The left ventricular ejection fraction is mildly decreased (45-54%). Nuclear stress EF: 52%. There was no ST segment deviation noted during stress. Defect 1: There is a small defect of severe severity present in the apex location. This is a low risk study.   Abnormal, low risk stress nuclear study with significant apical thinning vs infarct; cannot exclude very mild superimposed apical lateral ischemia; EF low normal (52) with normal wall motion.  TTE 02/26/2019: 1. The left ventricle has normal systolic function with an ejection  fraction of 60-65%. The  cavity size was normal. Left ventricular diastolic  Doppler parameters are consistent with impaired relaxation.  2. The right ventricle has normal systolic function. The cavity was  normal. There is no increase in right ventricular wall thickness.   3. Left atrial size was mildly dilated.  4. The aortic valve is tricuspid. Mild thickening of the aortic valve.  Mild calcification of the aortic valve.   )      Anesthesia Quick Evaluation

## 2020-10-16 ENCOUNTER — Inpatient Hospital Stay (HOSPITAL_COMMUNITY): Payer: Medicare Other

## 2020-10-16 ENCOUNTER — Other Ambulatory Visit: Payer: Self-pay

## 2020-10-16 ENCOUNTER — Encounter (HOSPITAL_COMMUNITY): Payer: Self-pay | Admitting: Thoracic Surgery (Cardiothoracic Vascular Surgery)

## 2020-10-16 ENCOUNTER — Inpatient Hospital Stay (HOSPITAL_COMMUNITY): Payer: Medicare Other | Admitting: Physician Assistant

## 2020-10-16 ENCOUNTER — Inpatient Hospital Stay (HOSPITAL_COMMUNITY): Payer: Medicare Other | Admitting: Anesthesiology

## 2020-10-16 ENCOUNTER — Encounter (HOSPITAL_COMMUNITY)
Admission: RE | Disposition: A | Discharge status: Home / Self Care | Payer: Self-pay | Source: Home / Self Care | Attending: Thoracic Surgery (Cardiothoracic Vascular Surgery)

## 2020-10-16 ENCOUNTER — Inpatient Hospital Stay (HOSPITAL_COMMUNITY)
Admission: RE | Admit: 2020-10-16 | Discharge: 2020-10-19 | DRG: 163 | Disposition: A | Discharge status: Home / Self Care | Payer: Medicare Other | Attending: Thoracic Surgery (Cardiothoracic Vascular Surgery) | Admitting: Thoracic Surgery (Cardiothoracic Vascular Surgery)

## 2020-10-16 DIAGNOSIS — C7802 Secondary malignant neoplasm of left lung: Principal | ICD-10-CM | POA: Diagnosis present

## 2020-10-16 DIAGNOSIS — K219 Gastro-esophageal reflux disease without esophagitis: Secondary | ICD-10-CM | POA: Diagnosis present

## 2020-10-16 DIAGNOSIS — Z7982 Long term (current) use of aspirin: Secondary | ICD-10-CM

## 2020-10-16 DIAGNOSIS — Z6838 Body mass index (BMI) 38.0-38.9, adult: Secondary | ICD-10-CM

## 2020-10-16 DIAGNOSIS — Z8249 Family history of ischemic heart disease and other diseases of the circulatory system: Secondary | ICD-10-CM | POA: Diagnosis not present

## 2020-10-16 DIAGNOSIS — Z7901 Long term (current) use of anticoagulants: Secondary | ICD-10-CM

## 2020-10-16 DIAGNOSIS — N2581 Secondary hyperparathyroidism of renal origin: Secondary | ICD-10-CM | POA: Diagnosis not present

## 2020-10-16 DIAGNOSIS — J95811 Postprocedural pneumothorax: Secondary | ICD-10-CM | POA: Diagnosis not present

## 2020-10-16 DIAGNOSIS — R911 Solitary pulmonary nodule: Secondary | ICD-10-CM

## 2020-10-16 DIAGNOSIS — K760 Fatty (change of) liver, not elsewhere classified: Secondary | ICD-10-CM | POA: Diagnosis present

## 2020-10-16 DIAGNOSIS — Z20822 Contact with and (suspected) exposure to covid-19: Secondary | ICD-10-CM | POA: Diagnosis present

## 2020-10-16 DIAGNOSIS — Z880 Allergy status to penicillin: Secondary | ICD-10-CM

## 2020-10-16 DIAGNOSIS — Y838 Other surgical procedures as the cause of abnormal reaction of the patient, or of later complication, without mention of misadventure at the time of the procedure: Secondary | ICD-10-CM | POA: Diagnosis not present

## 2020-10-16 DIAGNOSIS — Z09 Encounter for follow-up examination after completed treatment for conditions other than malignant neoplasm: Secondary | ICD-10-CM

## 2020-10-16 DIAGNOSIS — I120 Hypertensive chronic kidney disease with stage 5 chronic kidney disease or end stage renal disease: Secondary | ICD-10-CM | POA: Diagnosis not present

## 2020-10-16 DIAGNOSIS — R918 Other nonspecific abnormal finding of lung field: Secondary | ICD-10-CM | POA: Diagnosis present

## 2020-10-16 DIAGNOSIS — E1122 Type 2 diabetes mellitus with diabetic chronic kidney disease: Secondary | ICD-10-CM | POA: Diagnosis not present

## 2020-10-16 DIAGNOSIS — I493 Ventricular premature depolarization: Secondary | ICD-10-CM | POA: Diagnosis present

## 2020-10-16 DIAGNOSIS — C649 Malignant neoplasm of unspecified kidney, except renal pelvis: Secondary | ICD-10-CM | POA: Diagnosis not present

## 2020-10-16 DIAGNOSIS — G4733 Obstructive sleep apnea (adult) (pediatric): Secondary | ICD-10-CM | POA: Diagnosis not present

## 2020-10-16 DIAGNOSIS — Z79899 Other long term (current) drug therapy: Secondary | ICD-10-CM

## 2020-10-16 DIAGNOSIS — N186 End stage renal disease: Secondary | ICD-10-CM | POA: Diagnosis present

## 2020-10-16 DIAGNOSIS — E8889 Other specified metabolic disorders: Secondary | ICD-10-CM | POA: Diagnosis present

## 2020-10-16 DIAGNOSIS — Z905 Acquired absence of kidney: Secondary | ICD-10-CM

## 2020-10-16 DIAGNOSIS — Z9889 Other specified postprocedural states: Secondary | ICD-10-CM

## 2020-10-16 DIAGNOSIS — Z85528 Personal history of other malignant neoplasm of kidney: Secondary | ICD-10-CM | POA: Diagnosis not present

## 2020-10-16 DIAGNOSIS — Z992 Dependence on renal dialysis: Secondary | ICD-10-CM | POA: Diagnosis not present

## 2020-10-16 DIAGNOSIS — Z820 Family history of epilepsy and other diseases of the nervous system: Secondary | ICD-10-CM

## 2020-10-16 DIAGNOSIS — Z86711 Personal history of pulmonary embolism: Secondary | ICD-10-CM | POA: Diagnosis not present

## 2020-10-16 DIAGNOSIS — D62 Acute posthemorrhagic anemia: Secondary | ICD-10-CM | POA: Diagnosis not present

## 2020-10-16 DIAGNOSIS — K869 Disease of pancreas, unspecified: Secondary | ICD-10-CM | POA: Diagnosis present

## 2020-10-16 DIAGNOSIS — I480 Paroxysmal atrial fibrillation: Secondary | ICD-10-CM | POA: Diagnosis present

## 2020-10-16 HISTORY — PX: INTERCOSTAL NERVE BLOCK: SHX5021

## 2020-10-16 HISTORY — PX: NODE DISSECTION: SHX5269

## 2020-10-16 LAB — GLUCOSE, CAPILLARY
Glucose-Capillary: 127 mg/dL — ABNORMAL HIGH (ref 70–99)
Glucose-Capillary: 90 mg/dL (ref 70–99)
Glucose-Capillary: 136 mg/dL — ABNORMAL HIGH (ref 70–99)
Glucose-Capillary: 126 mg/dL — ABNORMAL HIGH (ref 70–99)

## 2020-10-16 LAB — POCT I-STAT, CHEM 8
BUN: 35 mg/dL — ABNORMAL HIGH (ref 8–23)
Calcium, Ion: 0.99 mmol/L — ABNORMAL LOW (ref 1.15–1.40)
Chloride: 94 mmol/L — ABNORMAL LOW (ref 98–111)
Creatinine, Ser: 8.2 mg/dL — ABNORMAL HIGH (ref 0.61–1.24)
Glucose, Bld: 126 mg/dL — ABNORMAL HIGH (ref 70–99)
HCT: 31 % — ABNORMAL LOW (ref 39.0–52.0)
Hemoglobin: 10.5 g/dL — ABNORMAL LOW (ref 13.0–17.0)
Potassium: 3.6 mmol/L (ref 3.5–5.1)
Sodium: 136 mmol/L (ref 135–145)
TCO2: 27 mmol/L (ref 22–32)

## 2020-10-16 LAB — APTT: aPTT: 29 seconds (ref 24–36)

## 2020-10-16 LAB — PROTIME-INR
INR: 1.2 (ref 0.8–1.2)
Prothrombin Time: 14.8 seconds (ref 11.4–15.2)

## 2020-10-16 SURGERY — WEDGE RESECTION, LUNG, ROBOT-ASSISTED, THORACOSCOPIC
Anesthesia: General | Site: Chest | Laterality: Left

## 2020-10-16 MED ORDER — CIPROFLOXACIN HCL 500 MG PO TABS: 500.0000 mg | ORAL_TABLET | Freq: Every day | ORAL | Status: DC

## 2020-10-16 MED ORDER — AMIODARONE HCL 200 MG PO TABS
200.0000 mg | ORAL_TABLET | Freq: Every day | ORAL | Status: DC
  Administered 2020-10-17 – 2020-10-18 (×2): 200 mg via ORAL
  Filled 2020-10-16 (×2): qty 1

## 2020-10-16 MED ORDER — PHENYLEPHRINE 40 MCG/ML (10ML) SYRINGE FOR IV PUSH (FOR BLOOD PRESSURE SUPPORT)
PREFILLED_SYRINGE | INTRAVENOUS | Status: DC | PRN
  Administered 2020-10-16 (×2): 120 ug via INTRAVENOUS
  Administered 2020-10-16: 80 ug via INTRAVENOUS

## 2020-10-16 MED ORDER — ORAL CARE MOUTH RINSE: 15.0000 mL | Freq: Once | OROMUCOSAL | Status: AC

## 2020-10-16 MED ORDER — OXYCODONE HCL 5 MG PO TABS
5.0000 mg | ORAL_TABLET | ORAL | Status: DC | PRN
  Administered 2020-10-16 – 2020-10-17 (×2): 5 mg via ORAL
  Filled 2020-10-16 (×2): qty 1
  Filled 2020-10-16: qty 2

## 2020-10-16 MED ORDER — ALLOPURINOL 100 MG PO TABS
100.0000 mg | ORAL_TABLET | Freq: Every evening | ORAL | Status: DC
  Administered 2020-10-16 – 2020-10-18 (×3): 100 mg via ORAL
  Filled 2020-10-16 (×3): qty 1

## 2020-10-16 MED ORDER — SODIUM CHLORIDE 0.9 % IV SOLN: INTRAVENOUS | Status: DC | PRN

## 2020-10-16 MED ORDER — SODIUM CHLORIDE 0.9 % IV SOLN: INTRAVENOUS | Status: DC

## 2020-10-16 MED ORDER — ATORVASTATIN CALCIUM 40 MG PO TABS
40.0000 mg | ORAL_TABLET | Freq: Every evening | ORAL | Status: DC
  Administered 2020-10-17 – 2020-10-18 (×2): 40 mg via ORAL
  Filled 2020-10-16 (×2): qty 1

## 2020-10-16 MED ORDER — MIDODRINE HCL 5 MG PO TABS
10.0000 mg | ORAL_TABLET | ORAL | Status: DC
  Administered 2020-10-17: 10 mg via ORAL
  Filled 2020-10-16: qty 2

## 2020-10-16 MED ORDER — ALBUMIN HUMAN 5 % IV SOLN
INTRAVENOUS | Status: AC
  Filled 2020-10-16: qty 250

## 2020-10-16 MED ORDER — ONDANSETRON HCL 4 MG/2ML IJ SOLN: 4.0000 mg | Freq: Four times a day (QID) | INTRAMUSCULAR | Status: DC | PRN

## 2020-10-16 MED ORDER — LIDOCAINE HCL (CARDIAC) PF 100 MG/5ML IV SOSY
PREFILLED_SYRINGE | INTRAVENOUS | Status: DC | PRN
  Administered 2020-10-16: 60 mg via INTRAVENOUS

## 2020-10-16 MED ORDER — SUGAMMADEX SODIUM 200 MG/2ML IV SOLN
INTRAVENOUS | Status: DC | PRN
  Administered 2020-10-16: 400 mg via INTRAVENOUS

## 2020-10-16 MED ORDER — LACTATED RINGERS IV SOLN: INTRAVENOUS | Status: DC

## 2020-10-16 MED ORDER — CHLORHEXIDINE GLUCONATE CLOTH 2 % EX PADS
6.0000 | MEDICATED_PAD | Freq: Every day | CUTANEOUS | Status: DC
  Administered 2020-10-16 – 2020-10-17 (×2): 6 via TOPICAL

## 2020-10-16 MED ORDER — SODIUM CHLORIDE FLUSH 0.9 % IV SOLN
INTRAVENOUS | Status: DC | PRN
  Administered 2020-10-16: 93 mL

## 2020-10-16 MED ORDER — SENNOSIDES-DOCUSATE SODIUM 8.6-50 MG PO TABS
1.0000 | ORAL_TABLET | Freq: Every day | ORAL | Status: DC
  Administered 2020-10-16 – 2020-10-18 (×3): 1 via ORAL
  Filled 2020-10-16 (×3): qty 1

## 2020-10-16 MED ORDER — PHENYLEPHRINE HCL-NACL 10-0.9 MG/250ML-% IV SOLN
0.0000 ug/min | INTRAVENOUS | Status: DC
  Administered 2020-10-16: 30 ug/min via INTRAVENOUS

## 2020-10-16 MED ORDER — CALCITRIOL 0.25 MCG PO CAPS
0.5000 ug | ORAL_CAPSULE | ORAL | Status: DC
  Administered 2020-10-17: 0.5 ug via ORAL
  Filled 2020-10-16 (×2): qty 2

## 2020-10-16 MED ORDER — PROPOFOL 10 MG/ML IV BOLUS
INTRAVENOUS | Status: AC
  Filled 2020-10-16: qty 20

## 2020-10-16 MED ORDER — WARFARIN SODIUM 3 MG PO TABS
3.0000 mg | ORAL_TABLET | ORAL | Status: DC
  Administered 2020-10-17: 3 mg via ORAL
  Filled 2020-10-16: qty 1

## 2020-10-16 MED ORDER — FENTANYL CITRATE (PF) 100 MCG/2ML IJ SOLN
INTRAMUSCULAR | Status: DC | PRN
  Administered 2020-10-16: 50 ug via INTRAVENOUS
  Administered 2020-10-16: 100 ug via INTRAVENOUS
  Administered 2020-10-16: 50 ug via INTRAVENOUS

## 2020-10-16 MED ORDER — TRAMADOL HCL 50 MG PO TABS: 50.0000 mg | ORAL_TABLET | Freq: Four times a day (QID) | ORAL | Status: DC | PRN

## 2020-10-16 MED ORDER — PHENYLEPHRINE HCL-NACL 10-0.9 MG/250ML-% IV SOLN
INTRAVENOUS | Status: DC | PRN
  Administered 2020-10-16: 80 ug/min via INTRAVENOUS

## 2020-10-16 MED ORDER — ALBUMIN HUMAN 5 % IV SOLN: INTRAVENOUS | Status: DC | PRN

## 2020-10-16 MED ORDER — EPHEDRINE SULFATE-NACL 50-0.9 MG/10ML-% IV SOSY
PREFILLED_SYRINGE | INTRAVENOUS | Status: DC | PRN
  Administered 2020-10-16: 5 mg via INTRAVENOUS

## 2020-10-16 MED ORDER — OXYCODONE HCL 5 MG PO TABS
ORAL_TABLET | ORAL | Status: AC
  Filled 2020-10-16: qty 1

## 2020-10-16 MED ORDER — WARFARIN SODIUM 3 MG PO TABS: 3.0000 mg | ORAL_TABLET | ORAL | Status: DC

## 2020-10-16 MED ORDER — FERRIC CITRATE 1 GM 210 MG(FE) PO TABS
210.0000 mg | ORAL_TABLET | Freq: Every day | ORAL | Status: DC
  Administered 2020-10-17 – 2020-10-18 (×2): 210 mg via ORAL
  Filled 2020-10-16 (×2): qty 1

## 2020-10-16 MED ORDER — ACETAMINOPHEN 160 MG/5ML PO SOLN: 1000.0000 mg | Freq: Four times a day (QID) | ORAL | Status: DC

## 2020-10-16 MED ORDER — ACETAMINOPHEN 500 MG PO TABS
1000.0000 mg | ORAL_TABLET | Freq: Four times a day (QID) | ORAL | Status: DC
  Administered 2020-10-16 – 2020-10-19 (×11): 1000 mg via ORAL
  Filled 2020-10-16 (×10): qty 2

## 2020-10-16 MED ORDER — FENTANYL CITRATE (PF) 100 MCG/2ML IJ SOLN
25.0000 ug | INTRAMUSCULAR | Status: DC | PRN
Start: 2020-10-16 — End: 2020-10-16

## 2020-10-16 MED ORDER — ASPIRIN 81 MG PO CHEW
81.0000 mg | CHEWABLE_TABLET | Freq: Every day | ORAL | Status: DC
  Administered 2020-10-17 – 2020-10-18 (×2): 81 mg via ORAL
  Filled 2020-10-16 (×2): qty 1

## 2020-10-16 MED ORDER — SUCCINYLCHOLINE CHLORIDE 200 MG/10ML IV SOSY
PREFILLED_SYRINGE | INTRAVENOUS | Status: DC | PRN
  Administered 2020-10-16: 140 mg via INTRAVENOUS

## 2020-10-16 MED ORDER — 0.9 % SODIUM CHLORIDE (POUR BTL) OPTIME
TOPICAL | Status: DC | PRN
  Administered 2020-10-16: 2000 mL

## 2020-10-16 MED ORDER — WARFARIN - PHYSICIAN DOSING INPATIENT: Freq: Every day | Status: DC

## 2020-10-16 MED ORDER — CHLORHEXIDINE GLUCONATE 0.12 % MT SOLN
15.0000 mL | Freq: Once | OROMUCOSAL | Status: AC
  Administered 2020-10-16: 15 mL via OROMUCOSAL
  Filled 2020-10-16: qty 15

## 2020-10-16 MED ORDER — PROPOFOL 10 MG/ML IV BOLUS
INTRAVENOUS | Status: DC | PRN
  Administered 2020-10-16 (×2): 50 mg via INTRAVENOUS
  Administered 2020-10-16: 200 mg via INTRAVENOUS

## 2020-10-16 MED ORDER — ACETAMINOPHEN 10 MG/ML IV SOLN: 1000.0000 mg | Freq: Once | INTRAVENOUS | Status: DC | PRN

## 2020-10-16 MED ORDER — HEMOSTATIC AGENTS (NO CHARGE) OPTIME
TOPICAL | Status: DC | PRN
  Administered 2020-10-16: 2 via TOPICAL

## 2020-10-16 MED ORDER — ROCURONIUM BROMIDE 10 MG/ML (PF) SYRINGE
PREFILLED_SYRINGE | INTRAVENOUS | Status: DC | PRN
  Administered 2020-10-16: 20 mg via INTRAVENOUS
  Administered 2020-10-16: 50 mg via INTRAVENOUS

## 2020-10-16 MED ORDER — FENTANYL CITRATE (PF) 250 MCG/5ML IJ SOLN
INTRAMUSCULAR | Status: AC
  Filled 2020-10-16: qty 5

## 2020-10-16 MED ORDER — FENTANYL CITRATE (PF) 100 MCG/2ML IJ SOLN
25.0000 ug | INTRAMUSCULAR | Status: DC | PRN
  Administered 2020-10-17: 25 ug via INTRAVENOUS
  Filled 2020-10-16 (×2): qty 2

## 2020-10-16 MED ORDER — OXYCODONE HCL 5 MG/5ML PO SOLN
5.0000 mg | Freq: Once | ORAL | Status: AC | PRN
Start: 2020-10-16 — End: 2020-10-16

## 2020-10-16 MED ORDER — INSULIN ASPART 100 UNIT/ML ~~LOC~~ SOLN
0.0000 [IU] | Freq: Three times a day (TID) | SUBCUTANEOUS | Status: DC
  Administered 2020-10-16: 1 [IU] via SUBCUTANEOUS

## 2020-10-16 MED ORDER — BUPIVACAINE HCL (PF) 0.5 % IJ SOLN
INTRAMUSCULAR | Status: AC
  Filled 2020-10-16: qty 30

## 2020-10-16 MED ORDER — WARFARIN SODIUM 2.5 MG PO TABS
4.5000 mg | ORAL_TABLET | ORAL | Status: DC
  Administered 2020-10-16 – 2020-10-18 (×2): 4.5 mg via ORAL
  Filled 2020-10-16 (×2): qty 1

## 2020-10-16 MED ORDER — SODIUM CHLORIDE 0.9 % IV SOLN
INTRAVENOUS | Status: AC | PRN
  Administered 2020-10-16: 1000 mL via INTRAMUSCULAR

## 2020-10-16 MED ORDER — INSULIN ASPART 100 UNIT/ML ~~LOC~~ SOLN: 0.0000 [IU] | SUBCUTANEOUS | Status: DC

## 2020-10-16 MED ORDER — OXYCODONE HCL 5 MG PO TABS
5.0000 mg | ORAL_TABLET | Freq: Once | ORAL | Status: AC | PRN
Start: 2020-10-16 — End: 2020-10-16
  Administered 2020-10-16: 5 mg via ORAL

## 2020-10-16 MED ORDER — VANCOMYCIN HCL 1000 MG/200ML IV SOLN
1000.0000 mg | Freq: Two times a day (BID) | INTRAVENOUS | Status: AC
Start: 2020-10-16 — End: 2020-10-16
  Administered 2020-10-16: 1000 mg via INTRAVENOUS
  Filled 2020-10-16: qty 200

## 2020-10-16 MED ORDER — BISACODYL 5 MG PO TBEC
10.0000 mg | DELAYED_RELEASE_TABLET | Freq: Every day | ORAL | Status: DC
  Administered 2020-10-17 – 2020-10-18 (×2): 10 mg via ORAL
  Filled 2020-10-16 (×2): qty 2

## 2020-10-16 MED ORDER — PROMETHAZINE HCL 25 MG/ML IJ SOLN: 6.2500 mg | INTRAMUSCULAR | Status: DC | PRN

## 2020-10-16 MED ORDER — INSULIN ASPART 100 UNIT/ML ~~LOC~~ SOLN
2.0000 [IU] | Freq: Three times a day (TID) | SUBCUTANEOUS | Status: DC
  Administered 2020-10-16 – 2020-10-18 (×6): 2 [IU] via SUBCUTANEOUS

## 2020-10-16 MED ORDER — TRAMADOL HCL 50 MG PO TABS
50.0000 mg | ORAL_TABLET | Freq: Two times a day (BID) | ORAL | Status: DC | PRN
  Administered 2020-10-16: 50 mg via ORAL
  Filled 2020-10-16: qty 1

## 2020-10-16 MED ORDER — MIDAZOLAM HCL 5 MG/5ML IJ SOLN
INTRAMUSCULAR | Status: DC | PRN
  Administered 2020-10-16: 2 mg via INTRAVENOUS

## 2020-10-16 MED ORDER — BUPIVACAINE LIPOSOME 1.3 % IJ SUSP
20.0000 mL | Freq: Once | INTRAMUSCULAR | Status: DC
  Filled 2020-10-16: qty 20

## 2020-10-16 MED ORDER — MIDAZOLAM HCL 2 MG/2ML IJ SOLN
INTRAMUSCULAR | Status: AC
  Filled 2020-10-16: qty 2

## 2020-10-16 MED ORDER — ONDANSETRON HCL 4 MG/2ML IJ SOLN
INTRAMUSCULAR | Status: DC | PRN
  Administered 2020-10-16: 4 mg via INTRAVENOUS

## 2020-10-16 MED ORDER — DEXAMETHASONE SODIUM PHOSPHATE 10 MG/ML IJ SOLN
INTRAMUSCULAR | Status: DC | PRN
  Administered 2020-10-16: 4 mg via INTRAVENOUS

## 2020-10-16 MED ORDER — ACETAMINOPHEN 325 MG PO TABS
650.0000 mg | ORAL_TABLET | Freq: Once | ORAL | Status: DC
  Filled 2020-10-16: qty 2

## 2020-10-16 MED ORDER — ENOXAPARIN SODIUM 30 MG/0.3ML ~~LOC~~ SOLN
30.0000 mg | Freq: Every day | SUBCUTANEOUS | Status: DC
  Administered 2020-10-17 – 2020-10-18 (×2): 30 mg via SUBCUTANEOUS
  Filled 2020-10-16 (×2): qty 0.3

## 2020-10-16 MED ORDER — MIDODRINE HCL 5 MG PO TABS: 10.0000 mg | ORAL_TABLET | ORAL | Status: DC

## 2020-10-16 SURGICAL SUPPLY — 105 items
APPLIER CLIP ROT 10 11.4 M/L (STAPLE)
BLADE CLIPPER SURG (BLADE) IMPLANT
BNDG COHESIVE 6X5 TAN STRL LF (GAUZE/BANDAGES/DRESSINGS) IMPLANT
CANISTER SUCT 3000ML PPV (MISCELLANEOUS) ×2 IMPLANT
CANNULA REDUC XI 12-8 STAPL (CANNULA) ×2
CANNULA REDUCER 12-8 DVNC XI (CANNULA) ×2 IMPLANT
CATH THORACIC 28FR (CATHETERS) IMPLANT
CATH THORACIC 28FR RT ANG (CATHETERS) IMPLANT
CATH THORACIC 36FR (CATHETERS) IMPLANT
CATH THORACIC 36FR RT ANG (CATHETERS) IMPLANT
CLIP APPLIE ROT 10 11.4 M/L (STAPLE) IMPLANT
CLIP VESOCCLUDE MED 6/CT (CLIP) IMPLANT
CNTNR URN SCR LID CUP LEK RST (MISCELLANEOUS) ×9 IMPLANT
CONN ST 1/4X3/8  BEN (MISCELLANEOUS) ×1
CONN ST 1/4X3/8 BEN (MISCELLANEOUS) ×1 IMPLANT
CONT SPEC 4OZ STRL OR WHT (MISCELLANEOUS) ×9
DEFOGGER SCOPE WARMER CLEARIFY (MISCELLANEOUS) ×2 IMPLANT
DERMABOND ADVANCED (GAUZE/BANDAGES/DRESSINGS) ×1
DERMABOND ADVANCED .7 DNX12 (GAUZE/BANDAGES/DRESSINGS) ×1 IMPLANT
DRAIN CHANNEL 28F RND 3/8 FF (WOUND CARE) ×2 IMPLANT
DRAIN CHANNEL 32F RND 10.7 FF (WOUND CARE) IMPLANT
DRAPE ARM DVNC X/XI (DISPOSABLE) ×4 IMPLANT
DRAPE COLUMN DVNC XI (DISPOSABLE) ×1 IMPLANT
DRAPE CV SPLIT W-CLR ANES SCRN (DRAPES) ×2 IMPLANT
DRAPE DA VINCI XI ARM (DISPOSABLE) ×4
DRAPE DA VINCI XI COLUMN (DISPOSABLE) ×1
DRAPE INCISE IOBAN 66X45 STRL (DRAPES) ×2 IMPLANT
DRAPE ORTHO SPLIT 77X108 STRL (DRAPES) ×1
DRAPE SURG ORHT 6 SPLT 77X108 (DRAPES) ×1 IMPLANT
ELECT BLADE 6.5 EXT (BLADE) ×2 IMPLANT
ELECT REM PT RETURN 9FT ADLT (ELECTROSURGICAL) ×2
ELECTRODE REM PT RTRN 9FT ADLT (ELECTROSURGICAL) ×1 IMPLANT
GAUZE KITTNER 4X5 RF (MISCELLANEOUS) ×4 IMPLANT
GAUZE SPONGE 4X4 12PLY STRL (GAUZE/BANDAGES/DRESSINGS) IMPLANT
GLOVE TRIUMPH SURG SIZE 7.5 (KITS) ×4 IMPLANT
GOWN STRL REUS W/ TWL LRG LVL3 (GOWN DISPOSABLE) ×1 IMPLANT
GOWN STRL REUS W/ TWL XL LVL3 (GOWN DISPOSABLE) ×2 IMPLANT
GOWN STRL REUS W/TWL 2XL LVL3 (GOWN DISPOSABLE) ×4 IMPLANT
GOWN STRL REUS W/TWL LRG LVL3 (GOWN DISPOSABLE) ×1
GOWN STRL REUS W/TWL XL LVL3 (GOWN DISPOSABLE) ×2
HEMOSTAT SURGICEL 2X14 (HEMOSTASIS) ×4 IMPLANT
IRRIGATION STRYKERFLOW (MISCELLANEOUS) ×1 IMPLANT
IRRIGATOR STRYKERFLOW (MISCELLANEOUS) ×2
KIT BASIN OR (CUSTOM PROCEDURE TRAY) ×2 IMPLANT
KIT SUCTION CATH 14FR (SUCTIONS) IMPLANT
KIT TURNOVER KIT B (KITS) ×2 IMPLANT
LOOP VESSEL SUPERMAXI WHITE (MISCELLANEOUS) IMPLANT
NEEDLE HYPO 25GX1X1/2 BEV (NEEDLE) ×2 IMPLANT
NEEDLE SPNL 22GX3.5 QUINCKE BK (NEEDLE) ×2 IMPLANT
NS IRRIG 1000ML POUR BTL (IV SOLUTION) ×2 IMPLANT
OBTURATOR OPTICAL STANDARD 8MM (TROCAR)
OBTURATOR OPTICAL STND 8 DVNC (TROCAR)
OBTURATOR OPTICALSTD 8 DVNC (TROCAR) IMPLANT
PACK CHEST (CUSTOM PROCEDURE TRAY) ×2 IMPLANT
PAD ARMBOARD 7.5X6 YLW CONV (MISCELLANEOUS) ×4 IMPLANT
RELOAD STAPLER 3.5X45 BLU DVNC (STAPLE) ×2 IMPLANT
RELOAD STAPLER 4.3X45 GRN DVNC (STAPLE) ×1 IMPLANT
SCISSORS LAP 5X35 DISP (ENDOMECHANICALS) IMPLANT
SEAL CANN UNIV 5-8 DVNC XI (MISCELLANEOUS) ×2 IMPLANT
SEAL XI 5MM-8MM UNIVERSAL (MISCELLANEOUS) ×2
SEALANT PROGEL (MISCELLANEOUS) IMPLANT
SEALANT SURG COSEAL 4ML (VASCULAR PRODUCTS) IMPLANT
SEALANT SURG COSEAL 8ML (VASCULAR PRODUCTS) IMPLANT
SET TUBE SMOKE EVAC HIGH FLOW (TUBING) IMPLANT
SOLUTION ELECTROLUBE (MISCELLANEOUS) IMPLANT
SPECIMEN JAR MEDIUM (MISCELLANEOUS) IMPLANT
SPONGE INTESTINAL PEANUT (DISPOSABLE) IMPLANT
SPONGE TONSIL TAPE 1 RFD (DISPOSABLE) IMPLANT
STAPLER 45 DA VINCI SURE FORM (STAPLE) ×1
STAPLER 45 SUREFORM DVNC (STAPLE) ×1 IMPLANT
STAPLER CANNULA SEAL DVNC XI (STAPLE) ×2 IMPLANT
STAPLER CANNULA SEAL XI (STAPLE) ×2
STAPLER RELOAD 3.5X45 BLU DVNC (STAPLE) ×2
STAPLER RELOAD 3.5X45 BLUE (STAPLE) ×2
STAPLER RELOAD 4.3X45 GREEN (STAPLE) ×1
STAPLER RELOAD 4.3X45 GRN DVNC (STAPLE) ×1
SUT PDS AB 3-0 SH 27 (SUTURE) IMPLANT
SUT PROLENE 4 0 RB 1 (SUTURE)
SUT PROLENE 4-0 RB1 .5 CRCL 36 (SUTURE) IMPLANT
SUT SILK  1 MH (SUTURE) ×2
SUT SILK 1 MH (SUTURE) ×2 IMPLANT
SUT SILK 1 TIES 10X30 (SUTURE) ×2 IMPLANT
SUT SILK 2 0 SH (SUTURE) IMPLANT
SUT SILK 2 0SH CR/8 30 (SUTURE) IMPLANT
SUT SILK 3 0SH CR/8 30 (SUTURE) IMPLANT
SUT VIC AB 1 CTX 36 (SUTURE)
SUT VIC AB 1 CTX36XBRD ANBCTR (SUTURE) IMPLANT
SUT VIC AB 2-0 CTX 36 (SUTURE) IMPLANT
SUT VIC AB 3-0 MH 27 (SUTURE) IMPLANT
SUT VIC AB 3-0 X1 27 (SUTURE) ×2 IMPLANT
SUT VICRYL 0 TIES 12 18 (SUTURE) ×2 IMPLANT
SUT VICRYL 0 UR6 27IN ABS (SUTURE) ×2 IMPLANT
SUT VICRYL 2 TP 1 (SUTURE) IMPLANT
SYR 20ML LL LF (SYRINGE) ×4 IMPLANT
SYSTEM RETRIEVAL ANCHOR 8 (MISCELLANEOUS) ×2 IMPLANT
SYSTEM SAHARA CHEST DRAIN ATS (WOUND CARE) ×2 IMPLANT
TAPE CLOTH 4X10 WHT NS (GAUZE/BANDAGES/DRESSINGS) ×2 IMPLANT
TAPE CLOTH SURG 4X10 WHT LF (GAUZE/BANDAGES/DRESSINGS) ×2 IMPLANT
TIP APPLICATOR SPRAY EXTEND 16 (VASCULAR PRODUCTS) IMPLANT
TOWEL GREEN STERILE (TOWEL DISPOSABLE) ×4 IMPLANT
TRAY FOLEY MTR SLVR 16FR STAT (SET/KITS/TRAYS/PACK) ×2 IMPLANT
TROCAR BLADELESS 15MM (ENDOMECHANICALS) IMPLANT
TROCAR XCEL 12X100 BLDLESS (ENDOMECHANICALS) ×2 IMPLANT
TROCAR XCEL BLADELESS 5X75MML (TROCAR) IMPLANT
WATER STERILE IRR 1000ML POUR (IV SOLUTION) ×2 IMPLANT

## 2020-10-16 NOTE — Brief Op Note (Addendum)
10/16/2020  11:14 AM  PATIENT:  George Lewis.  65 y.o. male  PRE-OPERATIVE DIAGNOSIS:  LEFT UPPER LOBE LUNG NODULE  POST-OPERATIVE DIAGNOSIS:  METASTATIC RENAL CELL CARCINOMA  PROCEDURE:   Xi ROBOTIC ASSISTED LEFT UPPER LOBE WEDGE RESECTION LYMPH NODE SAMPLING INTERCOSTAL NERVE BLOCKS LEVELS 3-10  SURGEON:  Surgeon(s) and Role:    * Melrose Nakayama, MD - Primary  PHYSICIAN ASSISTANT:  Nicholes Rough, PA-C  ANESTHESIA:   general  EBL:  20 mL   BLOOD ADMINISTERED:none  DRAINS: ONE STRAIGHT BLAKE DRAIN   LOCAL MEDICATIONS USED:  BUPIVICAINE   SPECIMEN:  Source of Specimen:  LEFT UPPER LOBE WEDGE, 3 LYMPH NODES  DISPOSITION OF SPECIMEN:  PATHOLOGY  COUNTS:  YES  TOURNIQUET:  * No tourniquets in log *  DICTATION: .Dragon Dictation  PLAN OF CARE: Admit to inpatient   PATIENT DISPOSITION:  PACU - hemodynamically stable.   Delay start of Pharmacological VTE agent (>24hrs) due to surgical blood loss or risk of bleeding: no  FROZEN- metastatic renal cell carcinoma, margin clear

## 2020-10-16 NOTE — Progress Notes (Signed)
Renal Navigator received call from Surgery that HD patient has been admitted under inpatient status. Renal PA/Grace E aware and Nephrology will be consulting on patient.  Alphonzo Cruise, [place] Renal Navigator [phone]

## 2020-10-16 NOTE — Progress Notes (Signed)
Admission from [place] awake and alert.

## 2020-10-16 NOTE — Anesthesia Procedure Notes (Signed)
Arterial Line Insertion Start/End2/18/2022 6:50 AM, 10/16/2020 7:00 AM Performed by: Murvin Natal, MD, anesthesiologist  Patient location: Pre-op. Preanesthetic checklist: patient identified, IV checked, site marked, risks and benefits discussed, surgical consent, monitors and equipment checked, pre-op evaluation, timeout performed and anesthesia consent Lidocaine 1% used for infiltration Right, radial was placed Catheter size: 20 Fr Hand hygiene performed , maximum sterile barriers used  and Seldinger technique used  Attempts: 1 Procedure performed using ultrasound guided technique. Ultrasound Notes:anatomy identified, needle tip was noted to be adjacent to the nerve/plexus identified and no ultrasound evidence of intravascular and/or intraneural injection Following insertion, dressing applied and Biopatch. Post procedure assessment: normal and unchanged  Patient tolerated the procedure well with no immediate complications.

## 2020-10-16 NOTE — Anesthesia Procedure Notes (Addendum)
Procedure Name: Intubation Date/Time: 10/16/2020 7:45 AM Performed by: Leonor Liv, CRNA Pre-anesthesia Checklist: Patient identified, Emergency Drugs available, Suction available and Patient being monitored Patient Re-evaluated:Patient Re-evaluated prior to induction Oxygen Delivery Method: Circle System Utilized Preoxygenation: Pre-oxygenation with 100% oxygen Induction Type: IV induction Ventilation: Mask ventilation without difficulty Laryngoscope Size: 4 and Glidescope Grade View: Grade I Tube type: Oral (Ambu Vivasight) Endobronchial tube: Double lumen EBT and EBT position confirmed by fiberoptic bronchoscope and 39 Fr Number of attempts: 1 Airway Equipment and Method: Stylet and Oral airway Placement Confirmation: ETT inserted through vocal cords under direct vision,  positive ETCO2 and breath sounds checked- equal and bilateral Secured at: 32 cm Tube secured with: Tape Dental Injury: Teeth and Oropharynx as per pre-operative assessment  Difficulty Due To: Difficulty was anticipated Comments: Mallampati 4, difficulty anticipated. Two hand mask ventilation with 49mm oral airway. Large tongue and oral tissue, Glidescope used. Performed under direct supervision by Paulina Fusi, SRNA.

## 2020-10-16 NOTE — Hospital Course (Signed)
      [address]       [place] [address]             [phone]         HPI:  George Lewis is a 65 year old man with a past medical history significant for renal cell carcinoma, pancreatic abnormality on PET, bilateral lung nodules, history of pulmonary embolus 12 years ago, obesity, poorly controlled atrial fibrillation with rapid ventricular response, chronic anticoagulation, obstructive sleep apnea, end-stage renal disease on hemodialysis, type 2 diabetes, reflux, and arthritis.   He had a right nephrectomy for stage IV renal cell carcinoma in 2019.  He has been followed with CTs since then.  He has had 2 lung nodules one in the right lower lobe and the other one in the lingula.  Those have slowly increased in size over time.  Recently a PET/CT showed a hypermetabolic 1 cm mass in the pancreas and an SUV of 3.8 in the lingular nodule and an SUV of 1.6 in the right lower lobe nodule.  Dr. Valeta Harms did bronchoscopy and biopsy of the lung nodules which was nondiagnostic.   He had an EUS of his pancreatic lesion.  No suspicious nodules were identified.   He is back on Coumadin.  He had a virtual visit with cardiology recently.  His amiodarone was increased from 100 to 200 mg daily.  Hospital Course:   On 10/16/2020 George Lewis underwent a robotic-assisted thorascopy-wedge resection. And lymph node dissection by Dr. Roxan Hockey. He tolerated the procedure well and was transferred to [facility] for continued care.

## 2020-10-16 NOTE — Consult Note (Signed)
[name] Renal Consultation Note    Indication for Consultation:  Management of ESRD/hemodialysis; anemia, hypertension/volume and secondary hyperparathyroidism  HPI: George Lewis. is a 65 y.o. male with ESRD on HD TTD, DM, HTN, OSA,, AFib on chronic warfarin, h/o right RCC s/p nephrectomy in 2019, h/o pancreatic mass, h/o bilateral lung nodules. Per notes he has had a bronchoscopy and biopsy of the lung nodules which were non diagnostic. He is admitted today following planned robotic assisted VATS wedge resection of left upper lobe per Dr. Roxan Hockey.   Seen at bedside following surgery. He is alert, conversant. Some left shoulder discomfort following procedure. Chest tube in place. No cp, sob. Nephrology consulted for routine dialysis.   Dialyzes at [facility]. Last dialysis Thursday. He completed a full treatment and left at his dry weight. Dialysis via LUE AVF.   Past Medical History:  Diagnosis Date  . Anemia   . Arthritis    knees  . Atrial fibrillation ([place])   . Bilateral renal cysts 03/23/2018   Noted MRI ABD  . Cancer [facility])    right kidney cancer  . Cancer [facility])    pancreatic  . Cholelithiasis 03/19/2018   noted on CT AB/Pelvis  . CKD (chronic kidney disease), stage IV [facility])    nephrologist-- dr Hollie Salk [name]);  05-01-2018 has not started dialysis, scheduled for AV fistula creation 05-07-2018  . Diabetes mellitus without complication ([place])   . Diverticulosis of colon 04/19/2018   Noted on CT abd/pelvis  . GERD (gastroesophageal reflux disease)   . Hepatic steatosis 03/19/2018   Mild diffuse, noted on CT AB/Pelvis  . History of kidney stones   . History of pulmonary embolus (PE) 03/2008   treated with coumadin for 6 months  . History of sepsis 04/20/2018   per discharge note , probable UTI  . Hypertension   . IBS (irritable bowel syndrome)   . Nocturia   . OSA on CPAP    uses CPAP nightly  . Pancreatic lesion     0.4cm cystic per CT 07/ 2019  . Peripheral vascular disease ([place])   . Pneumonia    x 1  . Pulmonary nodule    Solid 7m Right Lower Lobe  . Right renal mass 04/10/2018   new dx--  scheduled for nephrectomy 05-16-2018  . S/P ureteral stent placement: 04/16/2018 04/19/2018   Past Surgical History:  Procedure Laterality Date  . AV FISTULA PLACEMENT Left 05/07/2018   Procedure: Creation of Left arm Radiocephalic Fistula;  Surgeon: CMarty Heck MD;  [place]  Service: Vascular;  Laterality: Left;  . AV FISTULA PLACEMENT Left 05/15/2019   Procedure: Creation of Brachiocephalic fistula left arm;  Surgeon: CWaynetta Sandy MD;  [place]  Service: Vascular;  Laterality: Left;  . BASCILIC VEIN TRANSPOSITION Left 07/15/2019   Procedure: BASILIC VEIN TRANSPOSITION SECOND STAGE;  Surgeon: CWaynetta Sandy MD;  [place]  Service: Vascular;  Laterality: Left;  . BRONCHIAL BRUSHINGS  08/25/2020   Procedure: BRONCHIAL BRUSHINGS;  Surgeon: IGarner Nash DO;  [facility];  Service: Pulmonary;;  . BRONCHIAL NEEDLE ASPIRATION BIOPSY  08/25/2020   Procedure: BRONCHIAL NEEDLE ASPIRATION BIOPSIES;  Surgeon: IGarner Nash DO;  [place];  Service: Pulmonary;;  . BRONCHIAL WASHINGS  08/25/2020   Procedure: BRONCHIAL WASHINGS;  Surgeon: IGarner Nash DO;  [place];  Service: Pulmonary;;  . COLONOSCOPY    . CYSTOSCOPY/RETROGRADE/URETEROSCOPY/STONE EXTRACTION WITH BASKET  x2 last one 1990s approx.  . CYSTOSCOPY/URETEROSCOPY/HOLMIUM LASER/STENT PLACEMENT Left 04/16/2018   Procedure: CYSTOSCOPY/LEFT URETEROSCOPY/LEFT RETROGRADE/STENT PLACEMENT;  Surgeon: Lucas Mallow, MD;  [facility];  Service: Urology;  Laterality: Left;  . EUS N/A 05/03/2018   Procedure: UPPER ENDOSCOPIC ULTRASOUND (EUS) RADIAL;  Surgeon: Milus Banister, MD;  [facility];  Service: Gastroenterology;  Laterality: N/A;  . EUS N/A  05/03/2018   Procedure: UPPER ENDOSCOPIC ULTRASOUND (EUS) LINEAR;  Surgeon: Milus Banister, MD;  [facility];  Service: Gastroenterology;  Laterality: N/A;  . EUS  10/05/2020   upper GI  . EXTRACORPOREAL SHOCK WAVE LITHOTRIPSY  x3  last one 2004 approx.  Marland Kitchen FINE NEEDLE ASPIRATION N/A 05/03/2018   Procedure: FINE NEEDLE ASPIRATION (FNA) LINEAR;  Surgeon: Milus Banister, MD;  [facility];  Service: Gastroenterology;  Laterality: N/A;  . LAPAROSCOPIC NEPHRECTOMY, HAND ASSISTED Right 05/16/2018   Procedure: HAND ASSISTED LAPAROSCOPIC RIGHT NEPHRECTOMY;  Surgeon: Lucas Mallow, MD;  [facility];  Service: Urology;  Laterality: Right;  . left index finger attachment  1980s   3-4 surgeries  . TOE SURGERY  1980s   in beween 2nd and 3rd toes cyst removed  . UPPER GI ENDOSCOPY    . VIDEO BRONCHOSCOPY WITH ENDOBRONCHIAL NAVIGATION Bilateral 08/25/2020   Procedure: VIDEO BRONCHOSCOPY WITH ENDOBRONCHIAL NAVIGATION;  Surgeon: Garner Nash, DO;  Location: [place];  Service: Pulmonary;  Laterality: Bilateral;   Family History  Problem Relation Age of Onset  . Alzheimer's disease Mother   . Alzheimer's disease Father   . Heart disease Father   . Heart attack Father   . Cancer - Other Sister    Social History:  reports that he has never smoked. He has never used smokeless tobacco. He reports that he does not drink alcohol and does not use drugs. Allergies  Allergen Reactions  . Penicillins Rash and Other (See Comments)    Has patient had a PCN reaction causing immediate rash, facial/tongue/throat swelling, SOB or lightheadedness with hypotension: yes Has patient had a PCN reaction causing severe rash involving mucus membranes or skin necrosis: no Has patient had a PCN reaction that required hospitalization: no Has patient had a PCN reaction occurring within the last 10 years: no If all of the above answers are "NO", then may proceed with Cephalosporin use.     Prior to Admission medications   Medication Sig Start Date End Date Taking? Authorizing Provider  acetaminophen (TYLENOL) 500 MG tablet Take 1,000 mg by mouth as needed for mild pain or headache.    Yes [provider]  allopurinol (ZYLOPRIM) 100 MG tablet Take 1 tablet (100 mg total) by mouth every evening. 12/22/18  Yes Danford, Suann Larry, MD  amiodarone (PACERONE) 200 MG tablet Take 1 tablet (200 mg total) by mouth daily. 09/30/20  Yes Fenton, Clint R, PA  aspirin 81 MG chewable tablet Chew by mouth daily.   Yes [provider]  atorvastatin (LIPITOR) 40 MG tablet Take 1 tablet (40 mg total) by mouth daily. Patient taking differently: Take 40 mg by mouth every evening. 03/08/19  Yes Swayze, Ava, DO  AURYXIA 1 GM 210 MG(Fe) tablet Take 210 mg by mouth every evening. 08/09/19  Yes [provider]  ciprofloxacin (CIPRO) 500 MG tablet Take 1 tablet (500 mg total) by mouth daily with breakfast for 5 days. 10/14/20 10/19/20 Yes Melrose Nakayama, MD  diphenhydramine-acetaminophen (TYLENOL PM) 25-500 MG TABS tablet Take 2 tablets by  mouth at bedtime as needed (sleep).   Yes [provider]  fluticasone (FLONASE) 50 MCG/ACT nasal spray Place 1-2 sprays into both nostrils as needed for allergies or rhinitis.    Yes [provider]  loratadine (CLARITIN) 10 MG tablet Take 10 mg by mouth every evening.    Yes [provider]  midodrine (PROAMATINE) 10 MG tablet Take 10 mg by mouth See admin instructions. Taking 1 tablet in the am Tuesday, Thursday and Saturday and then 1 tablet half way through his treatment- Only on Dialysis days   Yes [provider]  nitroGLYCERIN (NITROSTAT) 0.4 MG SL tablet Place 1 tablet (0.4 mg total) under the tongue every 5 (five) minutes as needed for chest pain. 03/07/19  Yes Swayze, Ava, DO  omeprazole (PRILOSEC OTC) 20 MG tablet Take 40 mg by mouth daily before breakfast.    Yes [provider]   Sennosides (SENOKOT EXTRA STRENGTH) 17.2 MG TABS Take 17.2 mg by mouth daily as needed (constipation).   Yes [provider]  warfarin (COUMADIN) 3 MG tablet Take 1 to 1.5 tablets by mouth daily as directed by coumadin clinic Patient taking differently: Take 3-4.5 mg by mouth See admin instructions. Take 1 to 1.5 tablets by mouth daily as directed by coumadin clinic; 3 mg Tues Thurs Sat, 4.5 mg Sun Mon Wed Fri 08/18/20  Yes Elouise Munroe, MD  Accu-Chek FastClix Lancets MISC Use to check fasting blood sugar and 2 hrs after largest meal. 12/26/19   Abonza, Maritza, PA-C  ACCU-CHEK GUIDE test strip USE 1 STRIP TO CHECK FASTING BLOOD SUGAR AND 2 HOURS AFTER LARGEST MEAL 06/22/20   Abonza, Maritza, PA-C  blood glucose meter kit and supplies Dispense based on patient and insurance preference. Use up to four times daily as directed. (FOR ICD-10 E10.9, E11.9). 12/22/18   Danford, Suann Larry, MD   Current Facility-Administered Medications  Medication Dose Route Frequency Provider Last Rate Last Admin  . 0.9 %  sodium chloride infusion   Intravenous Continuous Conte, Tessa N, PA-C      . acetaminophen (TYLENOL) tablet 1,000 mg  1,000 mg Oral Q6H Conte, Tessa N, PA-C       Or  . acetaminophen (TYLENOL) 160 MG/5ML solution 1,000 mg  1,000 mg Oral Q6H Conte, Tessa N, PA-C      . allopurinol (ZYLOPRIM) tablet 100 mg  100 mg Oral QPM Melrose Nakayama, MD      . Derrill Memo ON 10/17/2020] amiodarone (PACERONE) tablet 200 mg  200 mg Oral Daily Conte, Tessa N, [place]      . [START ON 10/17/2020] aspirin chewable tablet 81 mg  81 mg Oral Daily Conte, Tessa N, [place]      . [START ON 10/17/2020] atorvastatin (LIPITOR) tablet 40 mg  40 mg Oral QPM Conte, Tessa N, PA-C      . bisacodyl (DULCOLAX) EC tablet 10 mg  10 mg Oral Daily Conte, Tessa N, [place]      . [START ON 10/17/2020] enoxaparin (LOVENOX) injection 30 mg  30 mg Subcutaneous Daily Conte, Tessa N, PA-C      . fentaNYL (SUBLIMAZE) injection 25 mcg  25  mcg Intravenous Q4H PRN Conte, Tessa N, PA-C      . [START ON 10/17/2020] ferric citrate (AURYXIA) tablet 210 mg  210 mg Oral QPM Conte, Tessa N, PA-C      . insulin aspart (novoLOG) injection 0-9 Units  0-9 Units Subcutaneous TID WC Melrose Nakayama, MD      .  insulin aspart (novoLOG) injection 2 Units  2 Units Subcutaneous TID WC Melrose Nakayama, MD      . midodrine (PROAMATINE) tablet 10 mg  10 mg Oral See admin instructions Elgie Collard, PA-C      . ondansetron [facility]) injection 4 mg  4 mg Intravenous Q6H PRN Conte, Tessa N, PA-C      . oxyCODONE (Oxy IR/ROXICODONE) 5 MG immediate release tablet           . oxyCODONE (Oxy IR/ROXICODONE) immediate release tablet 5-10 mg  5-10 mg Oral Q4H PRN Conte, Tessa N, PA-C      . senna-docusate (Senokot-S) tablet 1 tablet  1 tablet Oral QHS Conte, Tessa N, PA-C      . traMADol Veatrice Bourbon) tablet 50-100 mg  50-100 mg Oral Q6H PRN Conte, Tessa N, PA-C      . vancomycin (VANCOREADY) IVPB 1000 mg/200 mL  1,000 mg Intravenous Q12H Conte, Tessa N, PA-C      . [START ON 10/18/2020] warfarin (COUMADIN) tablet 3-4.5 mg  3-4.5 mg Oral See admin instructions Melrose Nakayama, MD         ROS: As per HPI otherwise negative.  Physical Exam: Vitals:   10/16/20 1135 10/16/20 1150 10/16/20 1205 10/16/20 1247  BP: 120/65 115/67 122/64   Pulse: 95 91 92   Resp: 20 (!) 21 20   Temp:   (!) 97.3 F (36.3 C)   TempSrc:    Oral  SpO2: 98% 99% 96%   Weight:      Height:         General: Well appearing, lying in bed, nad  Head: NCAT sclera not icteric  Neck: Supple. No JVD appreciated  Lungs: Clear bilaterally. Left chest tube in place.  Heart: RRR with S1 S2 Abdomen: soft non-tender  Lower extremities:without edema or ischemic changes, no open wounds  Neuro: A & O  X 3. Moves all extremities spontaneously. Psych:  Responds to questions appropriately with a normal affect. Dialysis Access: LUE AVF +bruit   Labs: Basic Metabolic Panel: Recent  Labs  Lab 10/14/20 1258 10/16/20 0617  NA 137 136  K 3.5 3.6  CL 95* 94*  CO2 20*  --   GLUCOSE 113* 126*  BUN 38* 35*  CREATININE 8.38* 8.20*  CALCIUM 8.8*  --    Liver Function Tests: Recent Labs  Lab 10/14/20 1258  AST 25  ALT 19  ALKPHOS 44  BILITOT 0.6  PROT 7.4  ALBUMIN 3.9   No results for input(s): LIPASE, AMYLASE in the last 168 hours. No results for input(s): AMMONIA in the last 168 hours. CBC: Recent Labs  Lab 10/14/20 1258 10/16/20 0617  WBC 12.0*  --   HGB 11.0* 10.5*  HCT 31.6* 31.0*  MCV 94.3  --   PLT 279  --    Cardiac Enzymes: No results for input(s): CKTOTAL, CKMB, CKMBINDEX, TROPONINI in the last 168 hours. CBG: Recent Labs  Lab 10/14/20 1210 10/16/20 0602 10/16/20 1025  GLUCAP 114* 127* 90   Iron Studies: No results for input(s): IRON, TIBC, TRANSFERRIN, FERRITIN in the last 72 hours. Studies/Results: DG Chest Port 1 View  Result Date: 10/16/2020 CLINICAL DATA:  Postop left lung wedge resection. EXAM: PORTABLE CHEST 1 VIEW COMPARISON:  Radiographs 10/14/2020 and 08/25/2020.  CT 07/29/2020. FINDINGS: 1019 hours. Moderately lower lung volumes, likely accounting for superior mediastinal widening. The heart size is grossly stable at the upper limits of normal. There is perihilar and lower lobe atelectasis  bilaterally. No pleural effusion or pneumothorax. Left chest tube is in place. IMPRESSION: Low lung volumes with bilateral atelectasis. No pneumothorax. Electronically Signed   By: Richardean Sale M.D.   On: 10/16/2020 10:42    Dialysis Orders:  [facility] 4h 76mn 450/500 EDW 129.5kg 2K/2.25 UFP 2  L AVF No heparin  Venofer 50 q wk  Calcitriol 0.5 PO TIW    Assessment/Plan: 1. Pulmonary nodules/metastatic renal cell carcinoma:  s/p LUL VATS wedge resection per Dr Hendrickson/CTS.  2. ESRD -  HD TTS. No urgent indication for dialysis today. Next HD 2/19 on schedule.  3. Hypertension/volume  - BP/volume stable. Takes midodrine on HD for  BP support. UF to EDW as tolerated  4. Anemia  - Hgb 10.5. Not on ESA as outpatient. Follow trends.  5. Metabolic bone disease -  OP Ca/Phos at goal. Continue calcitriol. Continue Auryixa binder.  6. AFib- on amio/warfarin -per primary  7. Nutrition - Renal diet/fluid restriction  OLynnda ChildPA-C [facility] 10/16/2020, 1:25 PM

## 2020-10-16 NOTE — Interval H&P Note (Signed)
History and Physical Interval Note:  10/16/2020 7:19 AM  George Lewis.  has presented today for surgery, with the diagnosis of LUL LUNG NODULES.  The various methods of treatment have been discussed with the patient and family. After consideration of risks, benefits and other options for treatment, the patient has consented to  Procedure(s): XI ROBOTIC ASSISTED THORASCOPY-WEDGE RESECTION (Left) as a surgical intervention.  The patient's history has been reviewed, patient examined, no change in status, stable for surgery.  I have reviewed the patient's chart and labs.  Questions were answered to the patient's satisfaction.     Melrose Nakayama

## 2020-10-16 NOTE — Transfer of Care (Signed)
Immediate Anesthesia Transfer of Care Note  Patient: George Lewis.  Procedure(s) Performed: XI ROBOTIC ASSISTED THORASCOPY-WEDGE RESECTION (Left Chest) INTERCOSTAL NERVE BLOCK (Left Chest) NODE DISSECTION (Left Chest)  Patient Location: PACU  Anesthesia Type:General  Level of Consciousness: awake, alert  and oriented  Airway & Oxygen Therapy: Patient Spontanous Breathing and Patient connected to face mask oxygen  Post-op Assessment: Report given to RN, Post -op Vital signs reviewed and stable and Patient moving all extremities  Post vital signs: Reviewed and stable. Hypotensive on NIBP and Art line, Dr. Roanna Banning notified, phenylephrine bolus given and gtt started, albumin started by RN.   Last Vitals:  Vitals Value Taken Time  BP 73/43 10/16/20 1023  Temp    Pulse 88 10/16/20 1027  Resp 17 10/16/20 1027  SpO2 100 % 10/16/20 1027  Vitals shown include unvalidated device data.  Last Pain:  Vitals:   10/16/20 0558  TempSrc: Oral         Complications: No complications documented.

## 2020-10-16 NOTE — Anesthesia Procedure Notes (Deleted)
Arterial Line Insertion Start/End2/18/2022 6:50 AM, 10/16/2020 7:05 AM Performed by: Murvin Natal, MD, anesthesiologist  Patient location: Pre-op. Preanesthetic checklist: patient identified, IV checked, site marked, risks and benefits discussed, surgical consent, monitors and equipment checked, pre-op evaluation, timeout performed and anesthesia consent Lidocaine 1% used for infiltration Right, radial was placed Catheter size: 20 G Hand hygiene performed , maximum sterile barriers used  and Seldinger technique used Allen's test indicative of satisfactory collateral circulation Attempts: 2 Procedure performed using ultrasound guided technique. Ultrasound Notes:anatomy identified, needle tip was noted to be adjacent to the nerve/plexus identified, no ultrasound evidence of intravascular and/or intraneural injection and image(s) printed for medical record Following insertion, dressing applied and Biopatch. Post procedure assessment: normal  Patient tolerated the procedure well with no immediate complications.

## 2020-10-17 ENCOUNTER — Inpatient Hospital Stay (HOSPITAL_COMMUNITY): Payer: Medicare Other

## 2020-10-17 LAB — GLUCOSE, CAPILLARY
Glucose-Capillary: 107 mg/dL — ABNORMAL HIGH (ref 70–99)
Glucose-Capillary: 114 mg/dL — ABNORMAL HIGH (ref 70–99)
Glucose-Capillary: 116 mg/dL — ABNORMAL HIGH (ref 70–99)

## 2020-10-17 LAB — BASIC METABOLIC PANEL
Anion gap: 22 — ABNORMAL HIGH (ref 5–15)
BUN: 51 mg/dL — ABNORMAL HIGH (ref 8–23)
CO2: 20 mmol/L — ABNORMAL LOW (ref 22–32)
Calcium: 8.3 mg/dL — ABNORMAL LOW (ref 8.9–10.3)
Chloride: 93 mmol/L — ABNORMAL LOW (ref 98–111)
Creatinine, Ser: 9.41 mg/dL — ABNORMAL HIGH (ref 0.61–1.24)
GFR, Estimated: 6 mL/min — ABNORMAL LOW (ref >60)
Glucose, Bld: 127 mg/dL — ABNORMAL HIGH (ref 70–99)
Potassium: 4.3 mmol/L (ref 3.5–5.1)
Sodium: 135 mmol/L (ref 135–145)

## 2020-10-17 LAB — CBC
HCT: 28 % — ABNORMAL LOW (ref 39.0–52.0)
Hemoglobin: 9.2 g/dL — ABNORMAL LOW (ref 13.0–17.0)
MCH: 31.8 pg (ref 26.0–34.0)
MCHC: 32.9 g/dL (ref 30.0–36.0)
MCV: 96.9 fL (ref 80.0–100.0)
Platelets: 199 10*3/uL (ref 150–400)
RBC: 2.89 MIL/uL — ABNORMAL LOW (ref 4.22–5.81)
RDW: 13.7 % (ref 11.5–15.5)
WBC: 12.9 10*3/uL — ABNORMAL HIGH (ref 4.0–10.5)
nRBC: 0 % (ref 0.0–0.2)

## 2020-10-17 MED ORDER — ALTEPLASE 2 MG IJ SOLR: 2.0000 mg | Freq: Once | INTRAMUSCULAR | Status: DC | PRN

## 2020-10-17 MED ORDER — SODIUM CHLORIDE 0.9 % IV SOLN: 100.0000 mL | INTRAVENOUS | Status: DC | PRN

## 2020-10-17 MED ORDER — LIDOCAINE-PRILOCAINE 2.5-2.5 % EX CREA: 1.0000 "application " | TOPICAL_CREAM | CUTANEOUS | Status: DC | PRN

## 2020-10-17 MED ORDER — LIDOCAINE HCL (PF) 1 % IJ SOLN: 5.0000 mL | INTRAMUSCULAR | Status: DC | PRN

## 2020-10-17 MED ORDER — PENTAFLUOROPROP-TETRAFLUOROETH EX AERO: 1.0000 "application " | INHALATION_SPRAY | CUTANEOUS | Status: DC | PRN

## 2020-10-17 MED ORDER — HEPARIN SODIUM (PORCINE) 1000 UNIT/ML DIALYSIS
1000.0000 [IU] | INTRAMUSCULAR | Status: DC | PRN
Start: 2020-10-18 — End: 2020-10-18

## 2020-10-17 NOTE — Anesthesia Postprocedure Evaluation (Signed)
Anesthesia Post Note  Patient: George Lewis.  Procedure(s) Performed: XI ROBOTIC ASSISTED THORASCOPY-WEDGE RESECTION (Left Chest) INTERCOSTAL NERVE BLOCK (Left Chest) NODE DISSECTION (Left Chest)     Patient location during evaluation: PACU Anesthesia Type: General Level of consciousness: awake Pain management: pain level controlled Vital Signs Assessment: post-procedure vital signs reviewed and stable Respiratory status: spontaneous breathing, nonlabored ventilation, respiratory function stable and patient connected to nasal cannula oxygen Cardiovascular status: blood pressure returned to baseline and stable Postop Assessment: no apparent nausea or vomiting Anesthetic complications: no   No complications documented.  Last Vitals:  Vitals:   10/16/20 2300 10/17/20 0300  BP: 120/66 (!) 93/52  Pulse: 98 92  Resp: 20 19  Temp: 36.7 C 36.5 C  SpO2: 98% 97%    Last Pain:  Vitals:   10/17/20 0300  TempSrc: Oral  PainSc:                  [name] P Louie Meaders

## 2020-10-17 NOTE — Op Note (Signed)
NAME: George Lewis MEDICAL RECORD [id number] ACCOUNT [id number] DATE OF BIRTH:Jul 14, 1956 FACILITY: [facility] LOCATION: [facility] PHYSICIAN:Kendell Sagraves Chaya Jan, MD  OPERATIVE REPORT  DATE OF PROCEDURE:  10/16/2020  PREOPERATIVE DIAGNOSIS:  Left upper lobe lung nodule.  POSTOPERATIVE DIAGNOSIS:  Metastatic renal cell carcinoma.  PROCEDURE:   Xi robotic-assisted wedge resection of left upper lobe nodule, Lymph node sampling and Intercostal nerve blocks from levels 3 through 10.  SURGEON:  Modesto Charon, MD  ASSISTANT:  Nicholes Rough, PA-C  ANESTHESIA:  General.  FINDINGS:  A nodule clearly visible in the lingula.  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  CLINICAL NOTE:  George Lewis is a 65 year old man with a complicated past medical history including stage IV renal cancer.  He has had lung nodules followed for the past couple of years and recently there was an increase in the size of a lingular nodule,  which had an SUV of 3.8 on PET/CT.  He had a navigational bronchoscopy that was nondiagnostic and is now sent for surgical biopsy for definitive diagnosis.  The indications, risks, benefits, and alternatives were discussed in detail with the patient.  He  understood the high risk nature of the procedure given his medical problems.  He understood and accepted the risks and agreed to proceed.  OPERATIVE NOTE:  George Lewis was brought to the preoperative holding area on 10/16/2020.  Anesthesia placed a central venous catheter and an arterial blood pressure monitoring line.  He was taken to the operating room, anesthetized and intubated with a  double lumen endotracheal tube.  Intravenous antibiotics were administered.  A Foley catheter was placed.  Sequential compression devices were placed on the calves for DVT prophylaxis.  He was placed in a right lateral decubitus position.  A Bair Hugger was placed for active warming.  The  left  chest was prepped and draped in the usual sterile fashion.   Single lung ventilation of the right lung was initiated and was tolerated well throughout the procedure.  A timeout was performed.  A solution containing 20 mL of liposomal bupivacaine, 30 mL of 0.5% bupivacaine and 50 mL of normal saline was prepared.  This was used for local anesthesia and the intercostal nerve blocks.  An incision was made in the 8th  interspace in the mid axillary line, an 8 mm robotic port was inserted.  The thoracoscope was advanced into the chest.  After intrapleural placement was confirmed, carbon dioxide was insufflated per protocol.  A 12 mm port was placed in the 8th  interspace anterior to the camera port and a 12 mm AirSeal port was placed in the 10th interspace centered between the 2 anterior ports.  Intercostal nerve blocks then were performed from the 3rd to the 10th interspace.  10 mL of the bupivacaine solution  was injected into a subpleural plane at each level.  Two additional robotic ports were placed in the 8th interspace with a 12 mm port placed 5 cm posterior to the camera port and an 8 mm port placed additional 5 cm posterior to that.  The robot was  deployed.  The camera port was docked.  Targeting was performed, the remaining ports were docked.  The instruments were inserted with thoracoscopic visualization.  The lingula was inspected and the nodule was clearly visible.  A wedge resection was performed with sequential firings of the robotic stapler using both green and blue cartridges maintaining greater than 1 cm gross margin.  The  specimen then was placed  into an endoscopic retrieval bag, removed and sent for frozen section.  While awaiting those results, the lung was elevated.  The inferior ligament was divided with bipolar cautery.  Level 9 and 8 lymph nodes were identified and were removed and sent for  permanent pathology.  These were benign in appearance.  The hilum was inspected  anteriorly.  There was a large fat pad.  It was not felt that additional dissection would be warranted in this area.  By this point, the frozen section returned showing a  nonsmall cell carcinoma consistent with metastatic renal cell carcinoma.  The robotic instruments were withdrawn and the robot was undocked.  A 28-French Blake drain was placed through the original port incision and directed towards the apex.  It was  secured to skin with a #1 silk suture.  Dual-lung ventilation was resumed.  The remaining incisions were closed with 0 Vicryl fascial sutures and 3-0 Vicryl subcuticular sutures.  All sponge, needle and instrument counts were correct at the end of the  procedure.  The patient was extubated in the operating room and taken to the [facility] in good condition.  IN/NUANCE  D:10/16/2020 T:10/17/2020 JOB:[id number]

## 2020-10-17 NOTE — Progress Notes (Addendum)
[address]       [name] [address]             [phone]      1 Day Post-Op Procedure(s) (LRB): XI ROBOTIC ASSISTED THORASCOPY-WEDGE RESECTION (Left) INTERCOSTAL NERVE BLOCK (Left) NODE DISSECTION (Left) Subjective: Some pain but relatively well controlled  Objective: Vital signs in last 24 hours: Temp:  [97 F (36.1 C)-98.8 F (37.1 C)] 97.7 F (36.5 C) (02/19 0300) Pulse Rate:  [89-109] 92 (02/19 0300) Cardiac Rhythm: Normal sinus rhythm (02/19 0709) Resp:  [14-25] 19 (02/19 0300) BP: (73-128)/(43-75) 93/52 (02/19 0300) SpO2:  [94 %-100 %] 97 % (02/19 0300) Arterial Line BP: (76-129)/(36-69) 121/56 (02/18 1205)  Hemodynamic parameters for last 24 hours:    Intake/Output from previous day: 02/18 0701 - 02/19 0700 In: 7824 [P.O.:200; I.V.:1140; IV Piggyback:250] Out: 556 [Urine:400; Blood:20; Chest Tube:136] Intake/Output this shift: Total I/O In: 240 [P.O.:240] Out: -   General appearance: alert, cooperative and no distress Heart: regular rate and rhythm Lungs: clear to auscultation bilaterally Abdomen: benign Extremities: PAS in place Wound: incis healing well  Lab Results: Recent Labs    10/14/20 1258 10/16/20 0617 10/17/20 0043  WBC 12.0*  --  12.9*  HGB 11.0* 10.5* 9.2*  HCT 31.6* 31.0* 28.0*  PLT 279  --  199   BMET:  Recent Labs    10/14/20 1258 10/16/20 0617 10/17/20 0043  NA 137 136 135  K 3.5 3.6 4.3  CL 95* 94* 93*  CO2 20*  --  20*  GLUCOSE 113* 126* 127*  BUN 38* 35* 51*  CREATININE 8.38* 8.20* 9.41*  CALCIUM 8.8*  --  8.3*    PT/INR:  Recent Labs    10/16/20 0555  LABPROT 14.8  INR 1.2   ABG    Component Value Date/Time   PHART 7.443 10/14/2020 1319   HCO3 24.7 10/14/2020 1319   TCO2 27 10/16/2020 0617   ACIDBASEDEF 9.0 (H) 12/19/2018 0055   O2SAT 98.0 10/14/2020 1319   CBG (last 3)  Recent Labs    10/16/20 1606 10/16/20 2056 10/17/20 0520  GLUCAP 136* 126* 107*     Meds Scheduled Meds: . acetaminophen  1,000 mg Oral Q6H   Or  . acetaminophen (TYLENOL) oral liquid 160 mg/5 mL  1,000 mg Oral Q6H  . allopurinol  100 mg Oral QPM  . amiodarone  200 mg Oral Daily  . aspirin  81 mg Oral Daily  . atorvastatin  40 mg Oral QPM  . bisacodyl  10 mg Oral Daily  . calcitRIOL  0.5 mcg Oral Q T,Th,Sa-HD  . Chlorhexidine Gluconate Cloth  6 each Topical Daily  . enoxaparin (LOVENOX) injection  30 mg Subcutaneous Daily  . ferric citrate  210 mg Oral Q supper  . insulin aspart  0-9 Units Subcutaneous TID WC  . insulin aspart  2 Units Subcutaneous TID WC  . midodrine  10 mg Oral Q T,Th,Sat-1800   And  . midodrine  10 mg Oral Q T,Th,Sat-1800  . senna-docusate  1 tablet Oral QHS  . warfarin  4.5 mg Oral Q M,W,F,Su-1800   And  . warfarin  3 mg Oral Q T,Th,Sat-1800  . Warfarin - Physician Dosing Inpatient   Does not apply q1600   Continuous Infusions: . sodium chloride 50 mL/hr at 10/16/20 2000   PRN Meds:.fentaNYL (SUBLIMAZE) injection, ondansetron (ZOFRAN) IV, oxyCODONE, traMADol  Xrays DG Chest Port 1 View  Result Date: 10/17/2020 CLINICAL DATA:  Postop EXAM: PORTABLE CHEST 1 VIEW COMPARISON:  Yesterday FINDINGS: Low volume postoperative chest with left chest tube. Mild atelectasis. No visible air leak. Stable enlarged heart size especially from mediastinal fat based on CT. IMPRESSION: Stable low volume postoperative chest. Electronically Signed   By: Monte Fantasia M.D.   On: 10/17/2020 08:25   DG Chest Port 1 View  Result Date: 10/16/2020 CLINICAL DATA:  Postop left lung wedge resection. EXAM: PORTABLE CHEST 1 VIEW COMPARISON:  Radiographs 10/14/2020 and 08/25/2020.  CT 07/29/2020. FINDINGS: 1019 hours. Moderately lower lung volumes, likely accounting for superior mediastinal widening. The heart size is grossly stable at the upper limits of normal. There is perihilar and lower lobe atelectasis bilaterally. No pleural effusion or pneumothorax. Left chest  tube is in place. IMPRESSION: Low lung volumes with bilateral atelectasis. No pneumothorax. Electronically Signed   By: Richardean Sale M.D.   On: 10/16/2020 10:42    Assessment/Plan: S/P Procedure(s) (LRB): XI ROBOTIC ASSISTED THORASCOPY-WEDGE RESECTION (Left) INTERCOSTAL NERVE BLOCK (Left) NODE DISSECTION (Left)  1 afeb, VSS with a few low systolic readings- mostly good. Sinus rhythm, H/O afib on amio- on midodrine. On coumadin 2 sats good on 2 liters 3 CXR- stable low lung volumes- no pntx 4 minor leukocytosis 5 for dialysis today. D/c foley 6 CT 136 cc, no air leak, d/c tube 7 chronic anemia- minor ABLA component 8 BS control is good 9 poss home tomorrow    LOS: 1 day    George Giovanni PA-C Pager [phone] 10/17/2020  Dialysis today poss home tomorrow  I have seen and examined George Lewis. and agree with the above assessment  and plan.  Grace Isaac MD Beeper [phone] Office [phone] 10/17/2020 9:40 AM

## 2020-10-17 NOTE — Progress Notes (Signed)
[name]., male   DOB: August 04, 1956, 65 y.o.   MRN: [id number]  [name] Progress Note   Assessment/ Plan:   1.  Pulmonary nodules/metastatic RCC: Status post left upper lobe VATS wedge resection yesterday.  Postoperative x-ray this morning without any acute findings.  Chest tube output 136 cc overnight. 2. ESRD: Continue hemodialysis on TTS schedule with dialysis ordered for later today.  Okay from renal standpoint to discontinue Foley catheter at this time. 3. Anemia: Slight drift of hemoglobin/hematocrit download noted postoperatively, anticipate some surgical losses and relative ESA resistance.  Continue to follow H&H trend. 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 5. Nutrition: Continue renal diet with fluid restriction 6. Hypotension: Chronic and remains on midodrine to allow tolerance to dialysis/ultrafiltration. 7.  Atrial fibrillation: On anticoagulation with warfarin, currently in sinus rhythm on amiodarone.  Subjective:   Denies any chest pain or shortness of breath but reports severe left shoulder pain that worsens with movement.  He is concerned about indwelling Foley catheter.   Objective:   BP (!) 93/52 (BP Location: Right Arm)   Pulse 92   Temp 97.7 F (36.5 C) (Oral)   Resp 19   Ht 6\' 1"  (1.854 m)   Wt 131.4 kg   SpO2 97%   BMI 38.22 kg/m   Physical Exam: Gen: Appears comfortable resting in bed, watching television CVS: Pulse regular rhythm, normal rate, S1 and S2 normal Resp: Decreased breath sounds left base with chest tube in situ, clear right side Abd: Soft, obese, nontender.  Foley catheter in situ-minimal urine drainage Ext: No lower extremity edema noted, left upper arm AV fistula with palpable thrill  Labs: BMET Recent Labs  Lab 10/14/20 1258 10/16/20 0617 10/17/20 0043  NA 137 136 135  K 3.5 3.6 4.3  CL 95* 94* 93*  CO2 20*  --   20*  GLUCOSE 113* 126* 127*  BUN 38* 35* 51*  CREATININE 8.38* 8.20* 9.41*  CALCIUM 8.8*  --  8.3*   CBC Recent Labs  Lab 10/14/20 1258 10/16/20 0617 10/17/20 0043  WBC 12.0*  --  12.9*  HGB 11.0* 10.5* 9.2*  HCT 31.6* 31.0* 28.0*  MCV 94.3  --  96.9  PLT 279  --  199      Medications:    . acetaminophen  1,000 mg Oral Q6H   Or  . acetaminophen (TYLENOL) oral liquid 160 mg/5 mL  1,000 mg Oral Q6H  . allopurinol  100 mg Oral QPM  . amiodarone  200 mg Oral Daily  . aspirin  81 mg Oral Daily  . atorvastatin  40 mg Oral QPM  . bisacodyl  10 mg Oral Daily  . calcitRIOL  0.5 mcg Oral Q T,Th,Sa-HD  . Chlorhexidine Gluconate Cloth  6 each Topical Daily  . enoxaparin (LOVENOX) injection  30 mg Subcutaneous Daily  . ferric citrate  210 mg Oral Q supper  . insulin aspart  0-9 Units Subcutaneous TID WC  . insulin aspart  2 Units Subcutaneous TID WC  . midodrine  10 mg Oral Q T,Th,Sat-1800   And  . midodrine  10 mg Oral Q T,Th,Sat-1800  . senna-docusate  1 tablet Oral QHS  . warfarin  4.5 mg Oral Q M,W,F,Su-1800   And  . warfarin  3 mg Oral Q T,Th,Sat-1800  . Warfarin - Physician Dosing Inpatient   Does not apply q1600   Elmarie Shiley, MD  10/17/2020, 8:47 AM

## 2020-10-18 ENCOUNTER — Inpatient Hospital Stay (HOSPITAL_COMMUNITY): Payer: Medicare Other

## 2020-10-18 ENCOUNTER — Encounter (HOSPITAL_COMMUNITY): Payer: Self-pay | Admitting: Thoracic Surgery (Cardiothoracic Vascular Surgery)

## 2020-10-18 LAB — COMPREHENSIVE METABOLIC PANEL
ALT: 14 U/L (ref 0–44)
AST: 15 U/L (ref 15–41)
Albumin: 3.4 g/dL — ABNORMAL LOW (ref 3.5–5.0)
Alkaline Phosphatase: 39 U/L (ref 38–126)
Anion gap: 21 — ABNORMAL HIGH (ref 5–15)
BUN: 73 mg/dL — ABNORMAL HIGH (ref 8–23)
CO2: 20 mmol/L — ABNORMAL LOW (ref 22–32)
Calcium: 8.5 mg/dL — ABNORMAL LOW (ref 8.9–10.3)
Chloride: 93 mmol/L — ABNORMAL LOW (ref 98–111)
Creatinine, Ser: 10.81 mg/dL — ABNORMAL HIGH (ref 0.61–1.24)
GFR, Estimated: 5 mL/min — ABNORMAL LOW (ref >60)
Glucose, Bld: 77 mg/dL (ref 70–99)
Potassium: 3.5 mmol/L (ref 3.5–5.1)
Sodium: 134 mmol/L — ABNORMAL LOW (ref 135–145)
Total Bilirubin: 0.6 mg/dL (ref 0.3–1.2)
Total Protein: 6.5 g/dL (ref 6.5–8.1)

## 2020-10-18 LAB — CBC
HCT: 28.6 % — ABNORMAL LOW (ref 39.0–52.0)
Hemoglobin: 9.2 g/dL — ABNORMAL LOW (ref 13.0–17.0)
MCH: 31.8 pg (ref 26.0–34.0)
MCHC: 32.2 g/dL (ref 30.0–36.0)
MCV: 99 fL (ref 80.0–100.0)
Platelets: 203 10*3/uL (ref 150–400)
RBC: 2.89 MIL/uL — ABNORMAL LOW (ref 4.22–5.81)
RDW: 13.8 % (ref 11.5–15.5)
WBC: 12.6 10*3/uL — ABNORMAL HIGH (ref 4.0–10.5)
nRBC: 0 % (ref 0.0–0.2)

## 2020-10-18 LAB — GLUCOSE, CAPILLARY
Glucose-Capillary: 121 mg/dL — ABNORMAL HIGH (ref 70–99)
Glucose-Capillary: 106 mg/dL — ABNORMAL HIGH (ref 70–99)
Glucose-Capillary: 103 mg/dL — ABNORMAL HIGH (ref 70–99)
Glucose-Capillary: 110 mg/dL — ABNORMAL HIGH (ref 70–99)

## 2020-10-18 NOTE — Plan of Care (Signed)

## 2020-10-18 NOTE — Discharge Summary (Incomplete)
Physician Discharge Summary  [name]. MRN: [id number] DOB/AGE: Oct 27, 1955 65 y.o.  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 S/P robot-assisted surgical procedure 10/16/2020  . Lung nodules 08/12/2020  . Secondary hypercoagulable state ([place]) 10/07/2019  . Solitary pulmonary nodule 08/08/2019  . Long term (current) use of anticoagulants 04/26/2019  . Diarrhea, unspecified 04/20/2019  . Near syncope 04/20/2019  . NSVT (nonsustained ventricular tachycardia) ([place]) 04/20/2019  . ESRD on hemodialysis ([place]) 04/20/2019  . Paroxysmal atrial fibrillation ([place]) 04/20/2019  . Hypovolemia 04/20/2019  . Coagulation defect, unspecified ([place]) 03/26/2019  . Anemia in chronic kidney disease 03/20/2019  . Dyspnea, unspecified 03/20/2019  . Hyperlipidemia, unspecified 03/20/2019  . 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 kidney disease, or unspecified chronic kidney disease 03/20/2019  . Hypokalemia 03/20/2019  . Iron deficiency anemia, unspecified 03/20/2019  . Malignant neoplasm of right kidney, except renal pelvis ([place]) 03/20/2019  . Secondary hyperparathyroidism of renal origin ([place]) 03/20/2019  . Chest pain 03/06/2019  . Type 2 diabetes mellitus with chronic kidney disease on chronic dialysis, with long-term current use of insulin ([place]) 03/06/2019  . Obesity (BMI 30-39.9) 03/06/2019  . Healthcare maintenance 01/28/2019  . Class 3 severe obesity due to excess calories with serious comorbidity and body mass index (BMI) of 40.0 to 44.9 in adult ([place]) 01/15/2019  . Fatigue 01/15/2019  . History of ventricular tachycardia 01/15/2019  . DKA (diabetic ketoacidoses) 12/19/2018  . Newly diagnosed diabetes ([place]) 12/19/2018  .  Syncope 06/12/2018  . Ileus ([place])   . SVT (supraventricular tachycardia) ([place])   . Renal mass, right 05/16/2018  . Mass of pancreas   . Abnormal CT of the abdomen   . Bacteremia due to Enterococcus 04/23/2018  . Sepsis ([place]) 04/19/2018  . End stage renal disease ([place]) 04/19/2018  . Dehydration 04/19/2018  . UTI (urinary tract infection): Probable 04/19/2018  . Leukocytosis 04/19/2018  . Anemia 04/19/2018  . Renal cell carcinoma, right ([place]) 04/19/2018  . S/P ureteral stent placement: 04/16/2018 04/19/2018  . GERD (gastroesophageal reflux disease) 04/19/2018  . OSA on CPAP 04/19/2018  . HTN (hypertension) 04/19/2018    HPI:  at time of office visit George Lewis is a 65 year old man with a past medical history significant for renal cell carcinoma, pancreatic abnormality on PET, bilateral lung nodules, history of pulmonary embolus 12 years ago, obesity, poorly controlled atrial fibrillation with rapid ventricular response, chronic anticoagulation, obstructive sleep apnea, end-stage renal disease on hemodialysis, type 2 diabetes, reflux, and arthritis.  He had a right nephrectomy for stage IV renal cell carcinoma in 2019.  He has been followed with CTs since then.  He has had 2 lung nodules one in the right lower lobe and the other one in the lingula.  Those have slowly increased in size over time.  Recently a PET/CT showed a hypermetabolic 1 cm mass in the pancreas and an SUV of 3.8 in the lingular nodule and an SUV of 1.6 in the right lower lobe nodule.  Dr. Valeta Harms did bronchoscopy and biopsy of the lung nodules which was nondiagnostic.  He had an EUS of his pancreatic lesion.  No suspicious nodules were identified.  He is back on Coumadin.  He had a virtual visit with cardiology recently.  His amiodarone was increased  from 100 to 200 mg daily.  The patient and all relevant studies were reviewed by Dr. Roxan Hockey and the patient was admitted for elective robotic wedge  resection/possible lobectomy.  Discharged Condition: good  Hospital Course:   Patient was admitted on 10/16/2020 and taken to the operating room at which time he underwent the below described procedure. He tolerated it well and was taken to the postanesthesia care unit in stable condition. Postoperative diagnosis was metastatic renal cell carcinoma.  Full pathology as listed below.  All routine lines, monitors and drainage devices were discontinued in the standard fashion.  He did have a small apical pneumothorax following chest tube removal which was observed an additional 24 hours and remained stable.  He was seen by nephrology who assisted with dialysis management during hospitalization.  He does have a mild expected acute blood loss anemia on top of his chronic anemia which is stable.  Incisions are healing well without evidence of infection.  He is tolerating gradually increasing activities using standard protocols but is limited by his deconditioning and chronic illness.  Overall at the time of discharge he was felt to be stable.   Consults: nephrology  Significant Diagnostic Studies: routine labs/CXRR'S  Treatments: surgery:  OPERATIVE REPORT  DATE OF PROCEDURE:  10/16/2020  PREOPERATIVE DIAGNOSIS:  Left upper lobe lung nodule.  POSTOPERATIVE DIAGNOSIS:  Metastatic renal cell carcinoma.  PROCEDURE:  Xi robotic-assisted wedge resection of left upper lobe nodule, lymph node sampling and intercostal nerve blocks from levels 3 through 10.  SURGEON:  Modesto Charon, MD  ASSISTANT:  Nicholes Rough, PA-C  ANESTHESIA:  General.  FINDINGS:  A nodule clearly visible in the lingula.  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  Discharge Exam: Blood pressure (!) 113/58, pulse 93, temperature 98 F (36.7 C), temperature source Oral, resp. rate 20, height $RemoveBe'6\' 1"'tdoGWYsJd$  (1.854 m), weight 131.4 kg, SpO2 97 %.  General appearance: alert,  cooperative and no distress Heart: regular rate and rhythm Lungs: slightly coarse on left Abdomen: benign Extremities: trace edema Wound: incis healing well   SURGICAL PATHOLOGY  CASE: [id number]  PATIENT: George Lewis  Surgical Pathology Report      Clinical History: left upper lobe lung nodules (cm)    FINAL MICROSCOPIC DIAGNOSIS:   A. LUNG, LEFT UPPER LOBE, WEDGE RESECTION:  - Metastatic clear cell carcinoma (1.4 cm)  - Margins uninvolved by carcinoma   B. LYMPH NODE, 8, EXCISION:  - No carcinoma identified in one lymph node (0/1)   C. LYMPH NODE, 9, EXCISION:  - No carcinoma identified in one lymph node (0/1)   D. LYMPH NODE, 9 #2, EXCISION:  - No carcinoma identified in one lymph node (0/1)   COMMENT:   A. The morphology is consistent with the patient's history of  clear-cell renal cell carcinoma. Dr. Saralyn Pilar reviewed the case and  agrees with the above diagnosis.   INTRAOPERATIVE DIAGNOSIS:   A1. LUNG, LEFT UPPER LOBE, PARENCHYMAL MARGIN, FROZEN SECTION:     "Uninvolved by carcinoma (0.8 cm grossly)"     Rapid intraoperative consult diagnosis rendered by Dr. Melina Copa @  [phone] 10/16/2020.   A2. LUNG, LEFT UPPER LOBE MASS, FROZEN SECTION:     "Clear cell carcinoma"     Rapid intraoperative consult diagnosis rendered by Dr. Melina Copa @  [phone] 10/16/2020.    GROSS DESCRIPTION:   Specimen: Lung, left upper lobe wedge resection, received fresh for  rapid intraoperative consult evaluation by frozen section.  Specimen integrity: Staple line across one aspect, otherwise intact.  Size, weight: 13 g, 8.3 x 4.4 x 1.6 cm  Pleura: Pink to red-purple, smooth, with slight scattered anthracosis.  Lesion: There is a 1.4 x 1.2 x 1.1 cm gray-white to pale yellow firm  well-defined subpleural mass with nodular cut surfaces. Mass does not  grossly involve pleura.  Margin(s): 0.8 cm to the staple line.  Nonneoplastic parenchyma: Pink-red, spongy.  Block  Summary:  Block 1 = tissue adjacent to staple line nearest mass, submitted for  frozen section  Block 2 = section of mass, submitted for frozen section  Blocks 3, 4 = additional sections of mass, submitted for routine  histology.   Specimen B: Received fresh labeled level eight node is a 0.7 x 0.25 x  0.2 cm anthracotic soft tissue, submitted 1 block.   Specimen C: Received fresh labeled level nine node is a 1.1 x 0.8 x 0.2  cm yellow to anthracotic soft tissue, in toto 1 block.   Specimen D: Received fresh labeled level nine node #2 is a 0.4 x 0.4 x  0.2 cm anthracotic soft tissue, in toto 1 block.   SW 10/16/2020      Final Diagnosis performed by Thressa Sheller, MD.  Electronically signed  10/19/2020   Disposition: Discharge disposition: 01-Home or Self Care      Discharge Instructions    Discharge patient   Complete by: As directed    Discharge disposition: 01-Home or Self Care   Discharge patient date: 10/19/2020     Allergies as of 10/19/2020      Reactions   Penicillins Rash, Other (See Comments)   Has patient had a PCN reaction causing immediate rash, facial/tongue/throat swelling, SOB or lightheadedness with hypotension: yes Has patient had a PCN reaction causing severe rash involving mucus membranes or skin necrosis: no Has patient had a PCN reaction that required hospitalization: no Has patient had a PCN reaction occurring within the last 10 years: no If all of the above answers are "NO", then may proceed with Cephalosporin use.      Medication List    STOP taking these medications   ciprofloxacin 500 MG tablet Commonly known as: Cipro     TAKE these medications   Accu-Chek FastClix Lancets Misc Use to check fasting blood sugar and 2 hrs after largest meal.   Accu-Chek Guide test strip Generic drug: glucose blood USE 1 STRIP TO CHECK FASTING BLOOD SUGAR AND 2 HOURS AFTER LARGEST MEAL   acetaminophen 500 MG tablet Commonly known as: TYLENOL Take  1,000 mg by mouth as needed for mild pain or headache.   allopurinol 100 MG tablet Commonly known as: ZYLOPRIM Take 1 tablet (100 mg total) by mouth every evening.   amiodarone 200 MG tablet Commonly known as: PACERONE Take 1 tablet (200 mg total) by mouth daily.   aspirin 81 MG chewable tablet Chew by mouth daily.   atorvastatin 40 MG tablet Commonly known as: LIPITOR Take 1 tablet (40 mg total) by mouth every evening.   blood glucose meter kit and supplies Dispense based on patient and insurance preference. Use up to four times daily as directed. (FOR ICD-10 E10.9, E11.9).   calcitRIOL 0.5 MCG capsule Commonly known as: ROCALTROL Take 1 capsule (0.5 mcg total) by mouth Every Tuesday,Thursday,and Saturday with dialysis. Start taking on: October 20, 2020   diphenhydramine-acetaminophen 25-500 MG Tabs tablet Commonly known as: TYLENOL PM Take 2 tablets by mouth at bedtime as needed (sleep).  ferric citrate 1 GM 210 MG(Fe) tablet Commonly known as: Auryxia Take 1 tablet (210 mg total) by mouth daily with supper. What changed: when to take this   fluticasone 50 MCG/ACT nasal spray Commonly known as: FLONASE Place 1-2 sprays into both nostrils as needed for allergies or rhinitis.   loratadine 10 MG tablet Commonly known as: CLARITIN Take 10 mg by mouth every evening.   midodrine 10 MG tablet Commonly known as: PROAMATINE Take 1 tablet (10 mg total) by mouth every Tuesday, Thursday, and Saturday at 6 PM. Start taking on: October 20, 2020 What changed:   when to take this  additional instructions   midodrine 10 MG tablet Commonly known as: PROAMATINE Take 1 tablet (10 mg total) by mouth every Tuesday, Thursday, and Saturday at 6 PM. Start taking on: October 20, 2020 What changed: You were already taking a medication with the same name, and this prescription was added. Make sure you understand how and when to take each.   nitroGLYCERIN 0.4 MG SL tablet Commonly  known as: NITROSTAT Place 1 tablet (0.4 mg total) under the tongue every 5 (five) minutes as needed for chest pain.   omeprazole 20 MG tablet Commonly known as: PRILOSEC OTC Take 40 mg by mouth daily before breakfast.   Senokot Extra Strength 17.2 MG Tabs Generic drug: Sennosides Take 17.2 mg by mouth daily as needed (constipation).   traMADol 50 MG tablet Commonly known as: ULTRAM Take 1 tablet (50 mg total) by mouth every 12 (twelve) hours as needed for up to 5 days (mild pain).   warfarin 3 MG tablet Commonly known as: COUMADIN Take as directed. If you are unsure how to take this medication, talk to your nurse or doctor. Original instructions: Take 1.5 tablets (4.5 mg total) by mouth every Monday,Wednesday,Friday, and Sunday at 6 PM. What changed:   how much to take  how to take this  when to take this  additional instructions   warfarin 3 MG tablet Commonly known as: COUMADIN Take as directed. If you are unsure how to take this medication, talk to your nurse or doctor. Original instructions: Take 1 tablet (3 mg total) by mouth every Tuesday, Thursday, and Saturday at 6 PM. Start taking on: October 20, 2020 What changed: You were already taking a medication with the same name, and this prescription was added. Make sure you understand how and when to take each.       Follow-up Information    Lorrene Reid, PA-C. Call in 1 day(s).   Specialty: Physician Assistant Contact information: [place] [address] [phone]        Elouise Munroe, MD .   Specialties: Cardiology, Radiology Contact information: [address] [place] [address] [phone]        Melrose Nakayama, MD Follow up.   Specialty: Cardiothoracic Surgery Why: Your routine follow-up appointment is on 11/03/2020 at 11:30am. Please arrive at 11:00am for a chest xray located at [facility] which is on the first floor of our  building Contact information: [place] [address] [phone]               Signed: John Giovanni PA-C 10/19/2020, 12:41 PM

## 2020-10-18 NOTE — Progress Notes (Signed)
[name]., male   DOB: 1956/07/10, 66 y.o.   MRN: [id number]  [name] Progress Note   Assessment/ Plan:   1.  Pulmonary nodules/metastatic RCC: Status post left upper lobe VATS wedge resection on 2/18 and status post discontinuation of chest tube yesterday.  Doing well from a postoperative standpoint with ongoing pain management. 2. ESRD: Continue hemodialysis on TTS schedule with dialysis done yesterday-next treatment due Tuesday. 3. Anemia: Slight drift of hemoglobin/hematocrit download noted postoperatively, anticipate some surgical losses and relative ESA resistance.   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 5. Nutrition: Continue renal diet with fluid restriction 6. Hypotension: Chronic and remains on midodrine to allow tolerance to dialysis/ultrafiltration. 7.  Atrial fibrillation: On anticoagulation with warfarin, currently in sinus rhythm on amiodarone.  Subjective:   He had an uneventful night after removal of Foley catheter/chest tube yesterday.  Still having intermittent left shoulder pain that is aggravated by movements.   Objective:   BP 114/62 (BP Location: Right Arm)   Pulse 93   Temp 98.4 F (36.9 C) (Oral)   Resp (!) 25   Ht 6\' 1"  (1.854 m)   Wt 131.4 kg   SpO2 94%   BMI 38.22 kg/m   Physical Exam: Gen: Appears comfortable resting in recliner, watching television CVS: Pulse regular rhythm, normal rate, S1 and S2 normal Resp: Poor inspiratory effort with decreased breath sounds over bases, no rales/rhonchi Abd: Soft, obese, nontender. Ext: No lower extremity edema noted, left upper arm AV fistula with palpable thrill  Labs: BMET Recent Labs  Lab 10/14/20 1258 10/16/20 0617 10/17/20 0043 10/18/20 0002  NA 137 136 135 134*  K 3.5 3.6 4.3 3.5  CL 95* 94* 93* 93*  CO2 20*  --  20* 20*  GLUCOSE 113* 126* 127* 77  BUN 38* 35* 51* 73*   CREATININE 8.38* 8.20* 9.41* 10.81*  CALCIUM 8.8*  --  8.3* 8.5*   CBC Recent Labs  Lab 10/14/20 1258 10/16/20 0617 10/17/20 0043 10/18/20 0002  WBC 12.0*  --  12.9* 12.6*  HGB 11.0* 10.5* 9.2* 9.2*  HCT 31.6* 31.0* 28.0* 28.6*  MCV 94.3  --  96.9 99.0  PLT 279  --  199 203      Medications:    . acetaminophen  1,000 mg Oral Q6H   Or  . acetaminophen (TYLENOL) oral liquid 160 mg/5 mL  1,000 mg Oral Q6H  . allopurinol  100 mg Oral QPM  . amiodarone  200 mg Oral Daily  . aspirin  81 mg Oral Daily  . atorvastatin  40 mg Oral QPM  . bisacodyl  10 mg Oral Daily  . calcitRIOL  0.5 mcg Oral Q T,Th,Sa-HD  . Chlorhexidine Gluconate Cloth  6 each Topical Daily  . enoxaparin (LOVENOX) injection  30 mg Subcutaneous Daily  . ferric citrate  210 mg Oral Q supper  . insulin aspart  0-9 Units Subcutaneous TID WC  . insulin aspart  2 Units Subcutaneous TID WC  . midodrine  10 mg Oral Q T,Th,Sat-1800   And  . midodrine  10 mg Oral Q T,Th,Sat-1800  . senna-docusate  1 tablet Oral QHS  . warfarin  4.5 mg Oral Q M,W,F,Su-1800   And  . warfarin  3 mg Oral Q T,Th,Sat-1800  . Warfarin - Physician Dosing Inpatient   Does not apply q1600   Elmarie Shiley, MD 10/18/2020, 7:52 AM

## 2020-10-18 NOTE — Progress Notes (Addendum)
[address]       [place] [address]             [phone]      2 Days Post-Op Procedure(s) (LRB): XI ROBOTIC ASSISTED THORASCOPY-WEDGE RESECTION (Left) INTERCOSTAL NERVE BLOCK (Left) NODE DISSECTION (Left) Subjective:  feels pretty well  Objective: Vital signs in last 24 hours: Temp:  [98 F (36.7 C)-98.4 F (36.9 C)] 98.2 F (36.8 C) (02/20 0748) Pulse Rate:  [86-102] 98 (02/20 0748) Cardiac Rhythm: Normal sinus rhythm (02/20 0700) Resp:  [19-39] 22 (02/20 0748) BP: (91-136)/(59-69) 116/68 (02/20 0748) SpO2:  [94 %-99 %] 97 % (02/20 0748)  Hemodynamic parameters for last 24 hours:    Intake/Output from previous day: 02/19 0701 - 02/20 0700 In: 960 [P.O.:960] Out: 2000  Intake/Output this shift: No intake/output data recorded.  General appearance: alert, cooperative and no distress Heart: regular rate and rhythm and tachy Lungs: left base with pleural friction rub Abdomen: obese, benign Extremities: no le edema Wound: healing well  Lab Results: Recent Labs    10/17/20 0043 10/18/20 0002  WBC 12.9* 12.6*  HGB 9.2* 9.2*  HCT 28.0* 28.6*  PLT 199 203   BMET:  Recent Labs    10/17/20 0043 10/18/20 0002  NA 135 134*  K 4.3 3.5  CL 93* 93*  CO2 20* 20*  GLUCOSE 127* 77  BUN 51* 73*  CREATININE 9.41* 10.81*  CALCIUM 8.3* 8.5*    PT/INR:  Recent Labs    10/16/20 0555  LABPROT 14.8  INR 1.2   ABG    Component Value Date/Time   PHART 7.443 10/14/2020 1319   HCO3 24.7 10/14/2020 1319   TCO2 27 10/16/2020 0617   ACIDBASEDEF 9.0 (H) 12/19/2018 0055   O2SAT 98.0 10/14/2020 1319   CBG (last 3)  Recent Labs    10/17/20 1056 10/17/20 1623 10/18/20 0631  GLUCAP 114* 116* 121*    Meds Scheduled Meds: . acetaminophen  1,000 mg Oral Q6H   Or  . acetaminophen (TYLENOL) oral liquid 160 mg/5 mL  1,000 mg Oral Q6H  . allopurinol  100 mg Oral QPM  . amiodarone  200 mg Oral Daily  . aspirin  81 mg Oral Daily  .  atorvastatin  40 mg Oral QPM  . bisacodyl  10 mg Oral Daily  . calcitRIOL  0.5 mcg Oral Q T,Th,Sa-HD  . Chlorhexidine Gluconate Cloth  6 each Topical Daily  . enoxaparin (LOVENOX) injection  30 mg Subcutaneous Daily  . ferric citrate  210 mg Oral Q supper  . insulin aspart  0-9 Units Subcutaneous TID WC  . insulin aspart  2 Units Subcutaneous TID WC  . midodrine  10 mg Oral Q T,Th,Sat-1800   And  . midodrine  10 mg Oral Q T,Th,Sat-1800  . senna-docusate  1 tablet Oral QHS  . warfarin  4.5 mg Oral Q M,W,F,Su-1800   And  . warfarin  3 mg Oral Q T,Th,Sat-1800  . Warfarin - Physician Dosing Inpatient   Does not apply q1600   Continuous Infusions: . sodium chloride 50 mL/hr at 10/16/20 2000   PRN Meds:.fentaNYL (SUBLIMAZE) injection, ondansetron (ZOFRAN) IV, oxyCODONE, traMADol  Xrays DG CHEST PORT 1 VIEW  Result Date: 10/18/2020 CLINICAL DATA:  Follow-up.  Status post left lung wedge resection. EXAM: PORTABLE CHEST 1 VIEW COMPARISON:  10/17/2020. FINDINGS: Postoperative change from wedge resection surgery within the left lung. The left chest tube has been removed. Small left apical and  basilar pneumothorax is identified overlying the left lung. At the apex this measures about 2 cm in thickness. Atelectasis is noted within the left base. Right lung clear. IMPRESSION: 1. Small left apical and basilar pneumothorax status post left chest tube removal. 2. Left base atelectasis. 3. These results will be called to the ordering clinician or representative by the Radiologist Assistant, and communication documented in the PACS or [employer]. Electronically Signed   By: George Lewis M.D.   On: 10/18/2020 07:08   DG Chest Port 1 View  Result Date: 10/17/2020 CLINICAL DATA:  Postop EXAM: PORTABLE CHEST 1 VIEW COMPARISON:  Yesterday FINDINGS: Low volume postoperative chest with left chest tube. Mild atelectasis. No visible air leak. Stable enlarged heart size especially from mediastinal fat based  on CT. IMPRESSION: Stable low volume postoperative chest. Electronically Signed   By: George Lewis M.D.   On: 10/17/2020 08:25   DG Chest Port 1 View  Result Date: 10/16/2020 CLINICAL DATA:  Postop left lung wedge resection. EXAM: PORTABLE CHEST 1 VIEW COMPARISON:  Radiographs 10/14/2020 and 08/25/2020.  CT 07/29/2020. FINDINGS: 1019 hours. Moderately lower lung volumes, likely accounting for superior mediastinal widening. The heart size is grossly stable at the upper limits of normal. There is perihilar and lower lobe atelectasis bilaterally. No pleural effusion or pneumothorax. Left chest tube is in place. IMPRESSION: Low lung volumes with bilateral atelectasis. No pneumothorax. Electronically Signed   By: George Lewis M.D.   On: 10/16/2020 10:42    Assessment/Plan: S/P Procedure(s) (LRB): XI ROBOTIC ASSISTED THORASCOPY-WEDGE RESECTION (Left) INTERCOSTAL NERVE BLOCK (Left) NODE DISSECTION (Left)   1 afeb, VSS 2 sats ok on 2 liters 3 left basilar and apical pntx on CXR 4 leukocytosis is stable 5 H/H is stable 6 d/w Dr George Lewis, will check PA/Lat in am and try to wean off O2 today   LOS: 2 days    George Giovanni PA-C Pager [phone] 10/18/2020 Small left apical and basilar pneumothorax status post left chest tube removal. Patient seen- dialysis yesterday went well Poss d/c tomorrow after chest xray and if stable  I have seen and examined George Lewis. and agree with the above assessment  and plan.  George Isaac MD Beeper [phone] Office [phone] 10/18/2020 10:23 AM

## 2020-10-19 ENCOUNTER — Inpatient Hospital Stay (HOSPITAL_COMMUNITY): Payer: Medicare Other

## 2020-10-19 LAB — PROTIME-INR
INR: 1.3 — ABNORMAL HIGH (ref 0.8–1.2)
Prothrombin Time: 16 seconds — ABNORMAL HIGH (ref 11.4–15.2)

## 2020-10-19 LAB — GLUCOSE, CAPILLARY
Glucose-Capillary: 115 mg/dL — ABNORMAL HIGH (ref 70–99)
Glucose-Capillary: 116 mg/dL — ABNORMAL HIGH (ref 70–99)

## 2020-10-19 LAB — SURGICAL PATHOLOGY

## 2020-10-19 MED ORDER — MIDODRINE HCL 10 MG PO TABS: 10.0000 mg | ORAL_TABLET | ORAL | Status: DC

## 2020-10-19 MED ORDER — FERRIC CITRATE 1 GM 210 MG(FE) PO TABS: 210.0000 mg | ORAL_TABLET | Freq: Every day | ORAL | Status: AC

## 2020-10-19 MED ORDER — WARFARIN SODIUM 3 MG PO TABS: 3.0000 mg | ORAL_TABLET | ORAL | Status: DC

## 2020-10-19 MED ORDER — TRAMADOL HCL 50 MG PO TABS: 50.0000 mg | ORAL_TABLET | Freq: Two times a day (BID) | ORAL | 0 refills | Status: AC | PRN

## 2020-10-19 MED ORDER — CALCITRIOL 0.5 MCG PO CAPS: 0.5000 ug | ORAL_CAPSULE | ORAL | 1 refills | Status: AC

## 2020-10-19 MED ORDER — WARFARIN SODIUM 3 MG PO TABS: 4.5000 mg | ORAL_TABLET | ORAL | Status: DC

## 2020-10-19 MED ORDER — ATORVASTATIN CALCIUM 40 MG PO TABS: 40.0000 mg | ORAL_TABLET | Freq: Every evening | ORAL | Status: AC

## 2020-10-19 NOTE — TOC Initial Note (Signed)
Transition of Care (TOC) - Initial/Assessment Note    Patient Details  Name: George Lewis. MRN: [id number] Date of Birth: Jun 28, 1956  Transition of [facility]) CM/SW Contact:    Zenon Mayo, RN Phone Number: 10/19/2020, 9:25 AM  Clinical Narrative:                 Patient is for dc today, he has no needs.  Expected Discharge Plan: Home/Self Care Barriers to Discharge: No Barriers Identified   Patient Goals and CMS Choice Patient states their goals for this hospitalization and ongoing recovery are:: home   Choice offered to / list presented to : NA  Expected Discharge Plan and Services Expected Discharge Plan: Home/Self Care   Discharge Planning Services: CM Consult Post Acute Care Choice: NA Living arrangements for the past 2 months: Single Family Home Expected Discharge Date: 10/19/20                 DME Agency: NA       HH Arranged: NA          Prior Living Arrangements/Services Living arrangements for the past 2 months: Single Family Home Lives with:: Spouse   Do you feel safe going back to the place where you live?: Yes      Need for Family Participation in Patient Care: Yes (Comment) Care giver support system in place?: Yes (comment)   Criminal Activity/Legal Involvement Pertinent to Current Situation/Hospitalization: No - Comment as needed  Activities of Daily Living Home Assistive Devices/Equipment: Eyeglasses,CBG Meter ADL Screening (condition at time of admission) Patient's cognitive ability adequate to safely complete daily activities?: Yes Is the patient deaf or have difficulty hearing?: Yes Does the patient have difficulty seeing, even when wearing glasses/contacts?: No Does the patient have difficulty concentrating, remembering, or making decisions?: No Patient able to express need for assistance with ADLs?: Yes Does the patient have difficulty dressing or bathing?: No Independently performs ADLs?: No Communication:  Independent Dressing (OT): Independent Grooming: Independent Feeding: Independent Bathing: Independent Toileting: Independent In/Out Bed: Independent Walks in Home: Independent Does the patient have difficulty walking or climbing stairs?: No Weakness of Legs: None Weakness of Arms/Hands: None  Permission Sought/Granted                  Emotional Assessment       Orientation: : Oriented to Self,Oriented to Place,Oriented to  Time,Oriented to Situation Alcohol / Substance Use: Not Applicable Psych Involvement: No (comment)  Admission diagnosis:  S/P robot-assisted surgical procedure [O27.741] Patient Active Problem List   Diagnosis Date Noted  . S/P robot-assisted surgical procedure 10/16/2020  . Lung nodules 08/12/2020  . Secondary hypercoagulable state ([place]) 10/07/2019  . Solitary pulmonary nodule 08/08/2019  . Long term (current) use of anticoagulants 04/26/2019  . Diarrhea, unspecified 04/20/2019  . Near syncope 04/20/2019  . NSVT (nonsustained ventricular tachycardia) ([place]) 04/20/2019  . ESRD on hemodialysis ([place]) 04/20/2019  . Paroxysmal atrial fibrillation ([place]) 04/20/2019  . Hypovolemia 04/20/2019  . Coagulation defect, unspecified ([place]) 03/26/2019  . Anemia in chronic kidney disease 03/20/2019  . Dyspnea, unspecified 03/20/2019  . Hyperlipidemia, unspecified 03/20/2019  . 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 kidney disease, or unspecified chronic kidney disease 03/20/2019  . Hypokalemia 03/20/2019  . Iron deficiency anemia, unspecified 03/20/2019  . Malignant neoplasm of right kidney, except renal pelvis ([place]) 03/20/2019  . Secondary hyperparathyroidism of renal origin ([place]) 03/20/2019  . Chest pain 03/06/2019  . Type 2 diabetes  mellitus with chronic kidney disease on chronic dialysis, with long-term current use of insulin ([place]) 03/06/2019  . Obesity (BMI 30-39.9) 03/06/2019  . Healthcare maintenance 01/28/2019  . Class  3 severe obesity due to excess calories with serious comorbidity and body mass index (BMI) of 40.0 to 44.9 in adult ([place]) 01/15/2019  . Fatigue 01/15/2019  . History of ventricular tachycardia 01/15/2019  . DKA (diabetic ketoacidoses) 12/19/2018  . Newly diagnosed diabetes ([place]) 12/19/2018  . Syncope 06/12/2018  . Ileus ([place])   . SVT (supraventricular tachycardia) ([place])   . Renal mass, right 05/16/2018  . Mass of pancreas   . Abnormal CT of the abdomen   . Bacteremia due to Enterococcus 04/23/2018  . Sepsis ([place]) 04/19/2018  . End stage renal disease ([place]) 04/19/2018  . Dehydration 04/19/2018  . UTI (urinary tract infection): Probable 04/19/2018  . Leukocytosis 04/19/2018  . Anemia 04/19/2018  . Renal cell carcinoma, right ([place]) 04/19/2018  . S/P ureteral stent placement: 04/16/2018 04/19/2018  . GERD (gastroesophageal reflux disease) 04/19/2018  . OSA on CPAP 04/19/2018  . HTN (hypertension) 04/19/2018   PCP:  Lorrene Reid, PA-C Pharmacy:   [facility] [address] [place] [address] [place] [address] Phone: [phone] Fax: [phone]     Social Determinants of Health (SDOH) Interventions    Readmission Risk Interventions Readmission Risk Prevention Plan 10/19/2020 04/23/2018  Transportation Screening Complete Complete  PCP or Specialist Appt within 3-5 Days (No Data) Complete  Home Care Screening - Complete  HRI or Home Care Consult Complete Complete  Social Work Consult for [place] Planning/Counseling Complete Complete  Palliative Care Screening Not Applicable Complete  Medication Review Press photographer) Complete Complete  Some recent data might be hidden

## 2020-10-19 NOTE — Discharge Instructions (Signed)
Discharge Instructions:  1. You may shower, please wash incisions daily with soap and water and keep dry.  If you wish to cover wounds with dressing you may do so but please keep clean and change daily.  No tub baths or swimming until incisions have completely healed.  If your incisions become red or develop any drainage please call our office at [phone]  2. No Driving until cleared by Dr. Roxan Hockey office and you are no longer using narcotic pain medications  3. Monitor your weight daily.. Please use the same scale and weigh at same time... If you gain 3-5 lbs in 48 hours with associated lower extremity swelling, please contact our office at [phone]  4. Fever of 101.5 for at least 24 hours with no source, please contact our office at [phone]    Information on my medicine - Coumadin   (Warfarin) Why was Coumadin prescribed for you? Coumadin was prescribed for you because you have a blood clot or a medical condition that can cause an increased risk of forming blood clots. Blood clots can cause serious health problems by blocking the flow of blood to the heart, lung, or brain. Coumadin can prevent harmful blood clots from forming. As a reminder your indication for Coumadin is:   Stroke Prevention Because Of Atrial Fibrillation  What test will check on my response to Coumadin? While on Coumadin (warfarin) you will need to have an INR test regularly to ensure that your dose is keeping you in the desired range. The INR (international normalized ratio) number is calculated from the result of the laboratory test called prothrombin time (PT).  If an INR APPOINTMENT HAS NOT ALREADY BEEN MADE FOR YOU please schedule an appointment to have this lab work done by your health care provider within 7 days. Your INR goal is usually a number between:  2 to 3 or your provider may give you a more narrow range like 2-2.5.  Ask your health care provider during an office visit what your goal INR  is.  What  do you need to  know  About  COUMADIN? Take Coumadin (warfarin) exactly as prescribed by your healthcare provider about the same time each day.  DO NOT stop taking without talking to the doctor who prescribed the medication.  Stopping without other blood clot prevention medication to take the place of Coumadin may increase your risk of developing a new clot or stroke.  Get refills before you run out.  What do you do if you miss a dose? If you miss a dose, take it as soon as you remember on the same day then continue your regularly scheduled regimen the next day.  Do not take two doses of Coumadin at the same time.  Important Safety Information A possible side effect of Coumadin (Warfarin) is an increased risk of bleeding. You should call your healthcare provider right away if you experience any of the following: ? Bleeding from an injury or your nose that does not stop. ? Unusual colored urine (red or dark brown) or unusual colored stools (red or black). ? Unusual bruising for unknown reasons. ? A serious fall or if you hit your head (even if there is no bleeding).  Some foods or medicines interact with Coumadin (warfarin) and might alter your response to warfarin. To help avoid this: ? Eat a balanced diet, maintaining a consistent amount of Vitamin K. ? Notify your provider about major diet changes you plan to make. ? Avoid alcohol or limit  your intake to 1 drink for women and 2 drinks for men per day. (1 drink is 5 oz. wine, 12 oz. beer, or 1.5 oz. liquor.)  Make sure that ANY health care provider who prescribes medication for you knows that you are taking Coumadin (warfarin).  Also make sure the healthcare provider who is monitoring your Coumadin knows when you have started a new medication including herbals and non-prescription products.  Coumadin (Warfarin)  Major Drug Interactions  Increased Warfarin Effect Decreased Warfarin Effect  Alcohol (large quantities) Antibiotics  (esp. Septra/Bactrim, Flagyl, Cipro) Amiodarone (Cordarone) Aspirin (ASA) Cimetidine (Tagamet) Megestrol (Megace) NSAIDs (ibuprofen, naproxen, etc.) Piroxicam (Feldene) Propafenone (Rythmol SR) Propranolol (Inderal) Isoniazid (INH) Posaconazole (Noxafil) Barbiturates (Phenobarbital) Carbamazepine (Tegretol) Chlordiazepoxide (Librium) Cholestyramine (Questran) Griseofulvin Oral Contraceptives Rifampin Sucralfate (Carafate) Vitamin K   Coumadin (Warfarin) Major Herbal Interactions  Increased Warfarin Effect Decreased Warfarin Effect  Garlic Ginseng Ginkgo biloba Coenzyme Q10 Green tea St. Johns wort    Coumadin (Warfarin) FOOD Interactions  Eat a consistent number of servings per week of foods HIGH in Vitamin K (1 serving =  cup)  Collards (cooked, or boiled & drained) Kale (cooked, or boiled & drained) Mustard greens (cooked, or boiled & drained) Parsley *serving size only =  cup Spinach (cooked, or boiled & drained) Swiss chard (cooked, or boiled & drained) Turnip greens (cooked, or boiled & drained)  Eat a consistent number of servings per week of foods MEDIUM-HIGH in Vitamin K (1 serving = 1 cup)  Asparagus (cooked, or boiled & drained) Broccoli (cooked, boiled & drained, or raw & chopped) Brussel sprouts (cooked, or boiled & drained) *serving size only =  cup Lettuce, raw (green leaf, endive, romaine) Spinach, raw Turnip greens, raw & chopped   These websites have more information on Coumadin (warfarin):  FailFactory.se; VeganReport.com.au;     5. Activity- up as tolerated, please walk at least 3 times per day.  Avoid strenuous activity, no lifting, pushing, or pulling with your arms over 8-10 lbs for a minimum of 6 weeks  6. If any questions or concerns arise, please do not hesitate to contact our office at [phone]

## 2020-10-19 NOTE — Progress Notes (Addendum)
[address]       [employer] [id number]             [phone]      3 Days Post-Op Procedure(s) (LRB): XI ROBOTIC ASSISTED THORASCOPY-WEDGE RESECTION (Left) INTERCOSTAL NERVE BLOCK (Left) NODE DISSECTION (Left) Subjective: Feels ok, min pain  Objective: Vital signs in last 24 hours: Temp:  [98 F (36.7 C)-98.9 F (37.2 C)] 98 F (36.7 C) (02/21 0415) Pulse Rate:  [93-100] 93 (02/21 0415) Cardiac Rhythm: Normal sinus rhythm (02/20 1930) Resp:  [20-22] 20 (02/21 0415) BP: (107-116)/(57-68) 113/58 (02/21 0415) SpO2:  [94 %-97 %] 97 % (02/21 0415)  Hemodynamic parameters for last 24 hours:    Intake/Output from previous day: 02/20 0701 - 02/21 0700 In: 840 [P.O.:840] Out: -  Intake/Output this shift: No intake/output data recorded.  General appearance: alert, cooperative and no distress Heart: regular rate and rhythm Lungs: slightly coarse on left Abdomen: benign Extremities: trace edema Wound: incis healing well  Lab Results: Recent Labs    10/17/20 0043 10/18/20 0002  WBC 12.9* 12.6*  HGB 9.2* 9.2*  HCT 28.0* 28.6*  PLT 199 203   BMET:  Recent Labs    10/17/20 0043 10/18/20 0002  NA 135 134*  K 4.3 3.5  CL 93* 93*  CO2 20* 20*  GLUCOSE 127* 77  BUN 51* 73*  CREATININE 9.41* 10.81*  CALCIUM 8.3* 8.5*    PT/INR:  Recent Labs    10/19/20 0034  LABPROT 16.0*  INR 1.3*   ABG    Component Value Date/Time   PHART 7.443 10/14/2020 1319   HCO3 24.7 10/14/2020 1319   TCO2 27 10/16/2020 0617   ACIDBASEDEF 9.0 (H) 12/19/2018 0055   O2SAT 98.0 10/14/2020 1319   CBG (last 3)  Recent Labs    10/18/20 1558 10/18/20 2107 10/19/20 0608  GLUCAP 103* 110* 115*    Meds Scheduled Meds:  acetaminophen  1,000 mg Oral Q6H   Or   acetaminophen (TYLENOL) oral liquid 160 mg/5 mL  1,000 mg Oral Q6H   allopurinol  100 mg Oral QPM   amiodarone  200 mg Oral Daily   aspirin  81 mg Oral Daily   atorvastatin  40 mg Oral QPM    bisacodyl  10 mg Oral Daily   calcitRIOL  0.5 mcg Oral Q T,Th,Sa-HD   Chlorhexidine Gluconate Cloth  6 each Topical Daily   enoxaparin (LOVENOX) injection  30 mg Subcutaneous Daily   ferric citrate  210 mg Oral Q supper   insulin aspart  0-9 Units Subcutaneous TID WC   insulin aspart  2 Units Subcutaneous TID WC   midodrine  10 mg Oral Q T,Th,Sat-1800   And   midodrine  10 mg Oral Q T,Th,Sat-1800   senna-docusate  1 tablet Oral QHS   warfarin  4.5 mg Oral Q M,W,F,Su-1800   And   warfarin  3 mg Oral Q T,Th,Sat-1800   Warfarin - Physician Dosing Inpatient   Does not apply q1600   Continuous Infusions:  sodium chloride 50 mL/hr at 10/16/20 2000   PRN Meds:.fentaNYL (SUBLIMAZE) injection, ondansetron (ZOFRAN) IV, oxyCODONE, traMADol  Xrays DG Chest 2 View  Result Date: 10/19/2020 CLINICAL DATA:  Follow-up exam, post left lung wedge resection EXAM: CHEST - 2 VIEW COMPARISON:  Radiograph 10/14/2020, CT 07/29/2020 FINDINGS: Persistent small left apical pneumothorax. No right pneumothorax. No visible pleural effusions. Bilateral basilar atelectatic changes most pronounced in the retrocardiac space. Stable cardiomediastinal contours.  No acute osseous or soft tissue abnormality. Telemetry leads overlie the chest. IMPRESSION: 1. Stable left apical pneumothorax. 2. Bilateral basilar atelectatic changes most pronounced in the retrocardiac space. Electronically Signed   By: Lovena Le M.D.   On: 10/19/2020 06:11   DG CHEST PORT 1 VIEW  Result Date: 10/18/2020 CLINICAL DATA:  Follow-up.  Status post left lung wedge resection. EXAM: PORTABLE CHEST 1 VIEW COMPARISON:  10/17/2020. FINDINGS: Postoperative change from wedge resection surgery within the left lung. The left chest tube has been removed. Small left apical and basilar pneumothorax is identified overlying the left lung. At the apex this measures about 2 cm in thickness. Atelectasis is noted within the left base. Right lung clear. IMPRESSION: 1.  Small left apical and basilar pneumothorax status post left chest tube removal. 2. Left base atelectasis. 3. These results will be called to the ordering clinician or representative by the Radiologist Assistant, and communication documented in the PACS or [employer]. Electronically Signed   By: Kerby Moors M.D.   On: 10/18/2020 07:08    Assessment/Plan: S/P Procedure(s) (LRB): XI ROBOTIC ASSISTED THORASCOPY-WEDGE RESECTION (Left) INTERCOSTAL NERVE BLOCK (Left) NODE DISSECTION (Left)  1 afeb, VSS, sinus with occ PVC's 2 sats good on RA-2 liters, wearing at night since CPAP isn't on- took off while in room , maintains 96 % 3 CXR is stable 4 INR 1.3 keep same schedule for coumadin 5 stable for d/c, dialysis as outpatient tomorrow       LOS: 3 days    John Giovanni PA-C Pager [phone] 10/19/2020 Patient seen and examined, agree with above Dc home  [place]. 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 Roxan Hockey, MD [facility] [phone]

## 2020-10-19 NOTE — TOC Transition Note (Signed)
Transition of Care [facility]) - CM/SW Discharge Note   Patient Details  Name: George Lewis. MRN: [id number] Date of Birth: 1956-06-16  [facility] CM/SW Contact:  Zenon Mayo, RN Phone Number: 10/19/2020, 9:26 AM   Clinical Narrative:    Patient is for dc today, he has no needs.     Final next level of care: Home/Self Care Barriers to Discharge: No Barriers Identified   Patient Goals and CMS Choice Patient states their goals for this hospitalization and ongoing recovery are:: home   Choice offered to / list presented to : NA  Discharge Placement                       Discharge Plan and Services   Discharge Planning Services: CM Consult Post Acute Care Choice: NA            DME Agency: NA       HH Arranged: NA          Social Determinants of Health (SDOH) Interventions     Readmission Risk Interventions Readmission Risk Prevention Plan 10/19/2020 04/23/2018  Transportation Screening Complete Complete  PCP or Specialist Appt within 3-5 Days (No Data) Complete  Home Care Screening - Complete  HRI or Home Care Consult Complete Complete  Social Work Consult for [place] Planning/Counseling Complete Complete  Palliative Care Screening Not Applicable Complete  Medication Review [employer]) Complete Complete  Some recent data might be hidden

## 2020-10-20 ENCOUNTER — Telehealth: Payer: Self-pay

## 2020-10-20 ENCOUNTER — Telehealth: Payer: Self-pay | Admitting: Nephrology

## 2020-10-20 NOTE — Telephone Encounter (Signed)
Transition Care Management Unsuccessful Follow-up Telephone Call  Date of discharge and from where:  10/19/20 from [facility]  Attempts:  1st Attempt  Reason for unsuccessful TCM follow-up call:  Left voice message

## 2020-10-20 NOTE — Telephone Encounter (Signed)
Transition of Care Contact from [place]   Date of Discharge: 10/18/20 Date of Contact: 10/20/20 Method of contact: phone Talked to patient wife George Lewis   Patient contacted to discuss transition of care form recent hospitaliztion. Patient was admitted to [facility] from 2/18 to 10/18/20 with the discharge diagnosis of lung nodule s/p robotic wedge resection/possible lobectomy.     Medication changes were reviewed.  Patient will follow up with is outpatient dialysis center today and again on 10/22/20.  Other follow up needs include none identified.    Jen Mow, PA-C [name] Pager: [phone]

## 2020-10-21 NOTE — Telephone Encounter (Signed)
Transition Care Management Unsuccessful Follow-up Telephone Call  Date of discharge and from where:  10/19/2020 from [facility]  Attempts:  2nd Attempt  Reason for unsuccessful TCM follow-up call:  Left voice message

## 2020-10-22 NOTE — Telephone Encounter (Signed)
Transition Care Management Unsuccessful Follow-up Telephone Call  Date of discharge and from where:  10/19/2020 from [facility]  Attempts:  3rd Attempt  Reason for unsuccessful TCM follow-up call:  Unable to reach patient

## 2020-10-26 ENCOUNTER — Ambulatory Visit (INDEPENDENT_AMBULATORY_CARE_PROVIDER_SITE_OTHER): Payer: Self-pay | Admitting: *Deleted

## 2020-10-26 ENCOUNTER — Other Ambulatory Visit: Payer: Self-pay

## 2020-10-26 DIAGNOSIS — Z4802 Encounter for removal of sutures: Secondary | ICD-10-CM

## 2020-10-26 DIAGNOSIS — Z9889 Other specified postprocedural states: Secondary | ICD-10-CM

## 2020-10-26 NOTE — Progress Notes (Signed)
Patient arrived for nurse visit to remove sutures post-RATs wedge resection 2/18 by Dr. Roxan Hockey.  One suture removed with no signs or symptoms of infection noted.  Patient tolerated suture removal well.  Incision well approximated.  Patient and family instructed to keep the incision site clean and dry. Patient and family acknowledged instructions given.  All questions answered.

## 2020-10-29 ENCOUNTER — Other Ambulatory Visit (HOSPITAL_COMMUNITY): Payer: Self-pay | Admitting: Internal Medicine

## 2020-11-03 ENCOUNTER — Other Ambulatory Visit: Payer: Self-pay | Admitting: Thoracic Surgery (Cardiothoracic Vascular Surgery)

## 2020-11-03 ENCOUNTER — Ambulatory Visit: Payer: PRIVATE HEALTH INSURANCE | Admitting: Thoracic Surgery (Cardiothoracic Vascular Surgery)

## 2020-11-03 DIAGNOSIS — R918 Other nonspecific abnormal finding of lung field: Secondary | ICD-10-CM

## 2020-11-04 ENCOUNTER — Ambulatory Visit (INDEPENDENT_AMBULATORY_CARE_PROVIDER_SITE_OTHER): Payer: Self-pay | Admitting: Thoracic Surgery (Cardiothoracic Vascular Surgery)

## 2020-11-04 ENCOUNTER — Encounter: Payer: Self-pay | Admitting: Internal Medicine

## 2020-11-04 ENCOUNTER — Ambulatory Visit (INDEPENDENT_AMBULATORY_CARE_PROVIDER_SITE_OTHER): Payer: Medicare Other

## 2020-11-04 ENCOUNTER — Encounter: Payer: Self-pay | Admitting: Thoracic Surgery (Cardiothoracic Vascular Surgery)

## 2020-11-04 ENCOUNTER — Ambulatory Visit
Admission: RE | Admit: 2020-11-04 | Discharge: 2020-11-04 | Disposition: A | Discharge status: Home / Self Care | Payer: Medicare Other | Source: Ambulatory Visit | Attending: Thoracic Surgery (Cardiothoracic Vascular Surgery) | Admitting: Thoracic Surgery (Cardiothoracic Vascular Surgery)

## 2020-11-04 ENCOUNTER — Other Ambulatory Visit: Payer: Self-pay

## 2020-11-04 ENCOUNTER — Ambulatory Visit (INDEPENDENT_AMBULATORY_CARE_PROVIDER_SITE_OTHER): Payer: Medicare Other | Admitting: Internal Medicine

## 2020-11-04 VITALS — BP 108/60 | HR 104 | Ht 73.0 in | Wt 292.0 lb

## 2020-11-04 VITALS — BP 124/71 | HR 115 | Resp 20 | Ht 73.0 in | Wt 293.0 lb

## 2020-11-04 DIAGNOSIS — Z7901 Long term (current) use of anticoagulants: Secondary | ICD-10-CM

## 2020-11-04 DIAGNOSIS — I480 Paroxysmal atrial fibrillation: Secondary | ICD-10-CM

## 2020-11-04 DIAGNOSIS — Z79899 Other long term (current) drug therapy: Secondary | ICD-10-CM

## 2020-11-04 DIAGNOSIS — R918 Other nonspecific abnormal finding of lung field: Secondary | ICD-10-CM

## 2020-11-04 DIAGNOSIS — D6869 Other thrombophilia: Secondary | ICD-10-CM

## 2020-11-04 DIAGNOSIS — R55 Syncope and collapse: Secondary | ICD-10-CM

## 2020-11-04 DIAGNOSIS — M7989 Other specified soft tissue disorders: Secondary | ICD-10-CM

## 2020-11-04 DIAGNOSIS — E785 Hyperlipidemia, unspecified: Secondary | ICD-10-CM

## 2020-11-04 DIAGNOSIS — Z9889 Other specified postprocedural states: Secondary | ICD-10-CM

## 2020-11-04 LAB — POCT INR: INR: 2.5 (ref 2.0–3.0)

## 2020-11-04 MED ORDER — PREDNISONE 10 MG (21) PO TBPK: ORAL_TABLET | ORAL | 0 refills | Status: AC

## 2020-11-04 MED ORDER — OXYCODONE HCL 5 MG PO TABS: 5.0000 mg | ORAL_TABLET | Freq: Four times a day (QID) | ORAL | 0 refills | Status: DC | PRN

## 2020-11-04 MED ORDER — AMIODARONE HCL 200 MG PO TABS: 200.0000 mg | ORAL_TABLET | Freq: Every day | ORAL | 3 refills | Status: DC

## 2020-11-04 MED ORDER — GABAPENTIN 100 MG PO CAPS: 100.0000 mg | ORAL_CAPSULE | Freq: Two times a day (BID) | ORAL | 2 refills | Status: DC

## 2020-11-04 NOTE — Progress Notes (Signed)
Cardiology Office Note:    Date:  11/04/2020   ID:  George Lewis., DOB 30-Jun-1956, MRN [id number]  PCP:  Lorrene Reid, PA-C  Cardiologist:  Elouise Munroe, MD  Electrophysiologist:  None   Referring MD: Lorrene Reid, PA-C   Chief Complaint/Reason for Referral: Afib, hypotension  History of Present Illness:    Jyquan Kenley. is a 65 y.o. male with a history of right renal cell carcinoma s/p nephrectomy in 9/19, ESRD on HD (TTS), history of pulmonary embolism, HTN, HLD, SVT, DM type II, OSA on CPAP, morbid obesity, and paroxysmal atrial fibrillation who presents for follow up.  He is on Coumadin for CHA2DS2-VASc score of 3.  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   He is quite fatigued today and feels he has no energy.  Last Saturday he experienced a syncopal episode after dialysis requiring volume resuscitation and decreased volume removal and subsequent dialysis.  He does take midodrine timed around his dialysis sessions, and not on nondialysis days.  He does still have hypotension and does have lightheadedness and dizziness.  His symptoms all seem most consistent with hypotension and orthostasis.  He has not had palpitations or chest pain.  Unfortunately he ran out of amiodarone on Monday, we will refill this today.  Fortunately he has maintained sinus rhythm.  Follows with Dr. Johnney Ou in nephrology, I will plan to call his nephrologist after this consult today to review his care and a team based approach.  He is not currently using compression garments, and he may do well with compression stockings that are fitted for any support this may provide in the setting of orthostasis and hypotension. Past Medical History:  Diagnosis Date  . Anemia   . Arthritis    knees  . Atrial fibrillation ([place])   . Bilateral renal  cysts 03/23/2018   Noted MRI ABD  . Cancer [facility])    right kidney cancer  . Cancer [facility])    pancreatic  . Cholelithiasis 03/19/2018   noted on CT AB/Pelvis  . CKD (chronic kidney disease), stage IV [facility])    nephrologist-- dr Hollie Salk [name]);  05-01-2018 has not started dialysis, scheduled for AV fistula creation 05-07-2018  . Diabetes mellitus without complication ([place])   . Diverticulosis of colon 04/19/2018   Noted on CT abd/pelvis  . GERD (gastroesophageal reflux disease)   . Hepatic steatosis 03/19/2018   Mild diffuse, noted on CT AB/Pelvis  . History of kidney stones   . History of pulmonary embolus (PE) 03/2008   treated with coumadin for 6 months  . History of sepsis 04/20/2018   per discharge note , probable UTI  . Hypertension   . IBS (irritable bowel syndrome)   . Nocturia   . OSA on CPAP    uses CPAP nightly  . Pancreatic lesion    0.4cm cystic per CT 07/ 2019  . Peripheral vascular disease ([place])   . Pneumonia    x 1  . Pulmonary nodule    Solid 67m Right Lower Lobe  . Right renal mass 04/10/2018   new dx--  scheduled for nephrectomy 05-16-2018  . S/P ureteral stent placement: 04/16/2018 04/19/2018    Past Surgical History:  Procedure Laterality Date  . AV FISTULA PLACEMENT Left 05/07/2018   Procedure: Creation of Left arm Radiocephalic Fistula;  Surgeon: CMarty Heck MD;  [facility] OR;  Service: Vascular;  Laterality: Left;  . AV FISTULA PLACEMENT Left 05/15/2019   Procedure: Creation of Brachiocephalic fistula left arm;  Surgeon: Waynetta Sandy, MD;  Location: [place];  Service: Vascular;  Laterality: Left;  . BASCILIC VEIN TRANSPOSITION Left 07/15/2019   Procedure: BASILIC VEIN TRANSPOSITION SECOND STAGE;  Surgeon: Waynetta Sandy, MD;  Location: [place];  Service: Vascular;  Laterality: Left;  . BRONCHIAL BRUSHINGS  08/25/2020   Procedure: BRONCHIAL BRUSHINGS;  Surgeon: Garner Nash, DO;  Location: [place] ENDOSCOPY;  Service:  Pulmonary;;  . BRONCHIAL NEEDLE ASPIRATION BIOPSY  08/25/2020   Procedure: BRONCHIAL NEEDLE ASPIRATION BIOPSIES;  Surgeon: Garner Nash, DO;  Location: [place] ENDOSCOPY;  Service: Pulmonary;;  . BRONCHIAL WASHINGS  08/25/2020   Procedure: BRONCHIAL WASHINGS;  Surgeon: Garner Nash, DO;  Location: [place] ENDOSCOPY;  Service: Pulmonary;;  . COLONOSCOPY    . CYSTOSCOPY/RETROGRADE/URETEROSCOPY/STONE EXTRACTION WITH BASKET  x2 last one 1990s approx.  . CYSTOSCOPY/URETEROSCOPY/HOLMIUM LASER/STENT PLACEMENT Left 04/16/2018   Procedure: CYSTOSCOPY/LEFT URETEROSCOPY/LEFT RETROGRADE/STENT PLACEMENT;  Surgeon: Lucas Mallow, MD;  [facility]: [facility];  Service: Urology;  Laterality: Left;  . EUS N/A 05/03/2018   Procedure: UPPER ENDOSCOPIC ULTRASOUND (EUS) RADIAL;  Surgeon: Milus Banister, MD;  [facility];  Service: Gastroenterology;  Laterality: N/A;  . EUS N/A 05/03/2018   Procedure: UPPER ENDOSCOPIC ULTRASOUND (EUS) LINEAR;  Surgeon: Milus Banister, MD;  [facility];  Service: Gastroenterology;  Laterality: N/A;  . EUS  10/05/2020   upper GI  . EXTRACORPOREAL SHOCK WAVE LITHOTRIPSY  x3  last one 2004 approx.  Marland Kitchen FINE NEEDLE ASPIRATION N/A 05/03/2018   Procedure: FINE NEEDLE ASPIRATION (FNA) LINEAR;  Surgeon: Milus Banister, MD;  [facility];  Service: Gastroenterology;  Laterality: N/A;  . INTERCOSTAL NERVE BLOCK Left 10/16/2020   Procedure: INTERCOSTAL NERVE BLOCK;  Surgeon: Melrose Nakayama, MD;  Location: [place];  Service: Thoracic;  Laterality: Left;  . LAPAROSCOPIC NEPHRECTOMY, HAND ASSISTED Right 05/16/2018   Procedure: HAND ASSISTED LAPAROSCOPIC RIGHT NEPHRECTOMY;  Surgeon: Lucas Mallow, MD;  [facility];  Service: Urology;  Laterality: Right;  . left index finger attachment  1980s   3-4 surgeries  . NODE DISSECTION Left 10/16/2020   Procedure: NODE DISSECTION;  Surgeon: Melrose Nakayama, MD;  Location: [place];  Service: Thoracic;  Laterality:  Left;  . TOE SURGERY  1980s   in beween 2nd and 3rd toes cyst removed  . UPPER GI ENDOSCOPY    . VIDEO BRONCHOSCOPY WITH ENDOBRONCHIAL NAVIGATION Bilateral 08/25/2020   Procedure: VIDEO BRONCHOSCOPY WITH ENDOBRONCHIAL NAVIGATION;  Surgeon: Garner Nash, DO;  Location: [place];  Service: Pulmonary;  Laterality: Bilateral;    Current Medications: Current Meds  Medication Sig  . Accu-Chek FastClix Lancets MISC Use to check fasting blood sugar and 2 hrs after largest meal.  . ACCU-CHEK GUIDE test strip USE 1 STRIP TO CHECK FASTING BLOOD SUGAR AND 2 HOURS AFTER LARGEST MEAL  . acetaminophen (TYLENOL) 500 MG tablet Take 1,000 mg by mouth as needed for mild pain or headache.   . allopurinol (ZYLOPRIM) 100 MG tablet Take 1 tablet (100 mg total) by mouth every evening.  Marland Kitchen atorvastatin (LIPITOR) 40 MG tablet Take 1 tablet (40 mg total) by mouth every evening.  . blood glucose meter kit and supplies Dispense based on patient and insurance preference. Use up to four times daily as directed. (FOR ICD-10 E10.9, E11.9).  . calcitRIOL (ROCALTROL) 0.5 MCG  capsule Take 1 capsule (0.5 mcg total) by mouth Every Tuesday,Thursday,and Saturday with dialysis.  Marland Kitchen diphenhydramine-acetaminophen (TYLENOL PM) 25-500 MG TABS tablet Take 2 tablets by mouth at bedtime as needed (sleep).  . ferric citrate (AURYXIA) 1 GM 210 MG(Fe) tablet Take 1 tablet (210 mg total) by mouth daily with supper.  . fluticasone (FLONASE) 50 MCG/ACT nasal spray Place 1-2 sprays into both nostrils as needed for allergies or rhinitis.   Marland Kitchen loratadine (CLARITIN) 10 MG tablet Take 10 mg by mouth every evening.   . midodrine (PROAMATINE) 10 MG tablet Take 1 tablet (10 mg total) by mouth every Tuesday, Thursday, and Saturday at 6 PM.  . nitroGLYCERIN (NITROSTAT) 0.4 MG SL tablet Place 1 tablet (0.4 mg total) under the tongue every 5 (five) minutes as needed for chest pain.  Marland Kitchen omeprazole (PRILOSEC OTC) 20 MG tablet Take 40 mg by mouth daily  before breakfast.   . Sennosides (SENOKOT EXTRA STRENGTH) 17.2 MG TABS Take 17.2 mg by mouth daily as needed (constipation).  . warfarin (COUMADIN) 3 MG tablet Take 1.5 tablets (4.5 mg total) by mouth every Monday,Wednesday,Friday, and Sunday at 6 PM.  . warfarin (COUMADIN) 3 MG tablet Take 1 tablet (3 mg total) by mouth every Tuesday, Thursday, and Saturday at 6 PM.  . [DISCONTINUED] amiodarone (PACERONE) 200 MG tablet Take 1 tablet (200 mg total) by mouth daily.  . [DISCONTINUED] aspirin 81 MG chewable tablet Chew by mouth daily.  . [DISCONTINUED] midodrine (PROAMATINE) 10 MG tablet Take 1 tablet (10 mg total) by mouth every Tuesday, Thursday, and Saturday at 6 PM.     Allergies:   Penicillins   Social History   Tobacco Use  . Smoking status: Never Smoker  . Smokeless tobacco: Never Used  Vaping Use  . Vaping Use: Never used  Substance Use Topics  . Alcohol use: Never  . Drug use: Never     Family History: The patient's family history includes Alzheimer's disease in his father and mother; Cancer - Other in his sister; Heart attack in his father; Heart disease in his father.  ROS:   Please see the history of present illness.    All other systems reviewed and are negative.  EKGs/Labs/Other Studies Reviewed:    The following studies were reviewed today:  EKG:  Sinus tachycardia, rate 104   Recent Labs: 10/18/2020: ALT 14; BUN 73; Creatinine, Ser 10.81; Hemoglobin 9.2; Platelets 203; Potassium 3.5; Sodium 134  Recent Lipid Panel    Component Value Date/Time   CHOL 177 08/05/2020 0852   TRIG 224 (H) 08/05/2020 0852   HDL 32 (L) 08/05/2020 0852   CHOLHDL 5.5 (H) 08/05/2020 0852   LDLCALC 106 (H) 08/05/2020 0867    Physical Exam:    VS:  BP 108/60   Pulse (!) 104   Ht '6\' 1"'  (1.854 m)   Wt 292 lb (132.5 kg)   SpO2 97%   BMI 38.52 kg/m     Wt Readings from Last 5 Encounters:  11/04/20 293 lb (132.9 kg)  11/04/20 292 lb (132.5 kg)  10/16/20 289 lb 11.2 oz (131.4  kg)  10/14/20 289 lb 11.2 oz (131.4 kg)  10/12/20 293 lb 12.8 oz (133.3 kg)    Constitutional: No acute distress Eyes: sclera non-icteric, normal conjunctiva and lids ENMT: normal dentition, moist mucous membranes Cardiovascular: regular rhythm, tachycardic rate, no murmurs. S1 and S2 normal. No jugular venous distention.  CHEST: Left posterior incisions well-healing. Respiratory: Decreased breath sounds on left, clear on right.  GI : normal bowel sounds, soft and nontender. No distention.   MSK: extremities warm, well perfused. No edema.  NEURO: grossly nonfocal exam, moves all extremities. PSYCH: alert and oriented x 3, normal mood and affect.   ASSESSMENT:    1. Paroxysmal atrial fibrillation (HCC)   2. Secondary hypercoagulable state ([place])   3. Long term (current) use of anticoagulants   4. Near syncope   5. Swelling of both lower extremities   6. Medication management   7. Hyperlipidemia, unspecified hyperlipidemia type    PLAN:    Paroxysmal atrial fibrillation ([place]) - Plan: EKG 12-Lead Secondary hypercoagulable state ([place]) Long term (current) use of anticoagulants - anemic but no significant bleeding. Reviewed indications for continued anticoagulation in setting of dialysis. Will discuss this with his care team and determine overall risks and benefits.  - refill amiodarone 200 mg daily  Orthostatic hypotension/Presyncope - Plan: Compression stockings - will prescribe compression stockings to see if that helps with symptoms of positional hypotension. Suspect this is related to volume shifts with dialysis. May want to consider increased frequency of midodrine, will review with nephrology  HLD - continue atorvastatin 40 mg daily.   Total time of encounter: 30 minutes total time of encounter, including 25 minutes spent in face-to-face patient care on the date of this encounter. This time includes coordination of care and counseling regarding above mentioned problem list.  Remainder of non-face-to-face time involved reviewing chart documents/testing relevant to the patient encounter and documentation in the medical record. I have independently reviewed documentation from referring provider.   Cherlynn Kaiser, MD, [facility]    Medication Adjustments/Labs and Tests Ordered: Current medicines are reviewed at length with the patient today.  Concerns regarding medicines are outlined above.   Orders Placed This Encounter  Procedures  . Compression stockings  . EKG 12-Lead    Meds ordered this encounter  Medications  . amiodarone (PACERONE) 200 MG tablet    Sig: Take 1 tablet (200 mg total) by mouth daily.    Dispense:  90 tablet    Refill:  3    Patient Instructions  Medication Instructions:  No Changes In Medications at this time.  *If you need a refill on your cardiac medications before your next appointment, please call your pharmacy*  Follow-Up: At [facility], you and your health needs are our priority.  As part of our continuing mission to provide you with exceptional heart care, we have created designated Provider Care Teams.  These Care Teams include your primary Cardiologist (physician) and Advanced Practice Providers (APPs -  Physician Assistants and Nurse Practitioners) who all work together to provide you with the care you need, when you need it.  We recommend signing up for the patient portal called "MyChart".  Sign up information is provided on this After Visit Summary.  MyChart is used to connect with patients for Virtual Visits (Telemedicine).  Patients are able to view lab/test results, encounter notes, upcoming appointments, etc.  Non-urgent messages can be sent to your provider as well.   To learn more about what you can do with MyChart, go to [email or web address].    Your next appointment:   3 month(s)  The format for your next appointment:   In Person  Provider:   Cherlynn Kaiser, MD

## 2020-11-04 NOTE — Patient Instructions (Signed)
Medication Instructions:  No Changes In Medications at this time.  *If you need a refill on your cardiac medications before your next appointment, please call your pharmacy*  Follow-Up: At [facility], you and your health needs are our priority.  As part of our continuing mission to provide you with exceptional heart care, we have created designated Provider Care Teams.  These Care Teams include your primary Cardiologist (physician) and Advanced Practice Providers (APPs -  Physician Assistants and Nurse Practitioners) who all work together to provide you with the care you need, when you need it.  We recommend signing up for the patient portal called "MyChart".  Sign up information is provided on this After Visit Summary.  MyChart is used to connect with patients for Virtual Visits (Telemedicine).  Patients are able to view lab/test results, encounter notes, upcoming appointments, etc.  Non-urgent messages can be sent to your provider as well.   To learn more about what you can do with MyChart, go to [email or web address].    Your next appointment:   3 month(s)  The format for your next appointment:   In Person  Provider:   Cherlynn Kaiser, MD

## 2020-11-04 NOTE — Progress Notes (Signed)
[address]       [place] [address]             [phone]    HPI: Mr. George Lewis returns for a scheduled visit following his recent wedge resection.  Roben Schliep is a 65 year old man with a past medical history of renal cell carcinoma, pancreatic abnormality on PET CT, bilateral lung nodules, history of pulmonary embolus, obesity, poorly controlled atrial fibrillation with RVR, chronic anticoagulation, obstructive sleep apnea, end-stage renal disease, hemodialysis, type 2 diabetes, reflux, and arthritis.  He had a right nephrectomy for stage IV renal cell carcinoma in 2019.  He had 2 lung nodules that showed up on follow-up CTs.  One was in the right lower lobe and the other one was in the lingula.  There is slowly increased in size over time.  On the PET CT the lingular nodule had an SUV of 3.8.  Bronchoscopy was nondiagnostic.  I did a robotic assisted wedge resection of the lingular nodule on 10/16/2020.  The lesion turned out to be metastatic renal cell carcinoma.  His postoperative course was uncomplicated and he went home on day 3.  He has been having a lot of pain.  He has been taking 3-4 Tylenol tablets every 3 hours.  Tramadol was not effective.  No significant respiratory issues.  Past Medical History:  Diagnosis Date  . Anemia   . Arthritis    knees  . Atrial fibrillation ([place])   . Bilateral renal cysts 03/23/2018   Noted MRI ABD  . Cancer [facility])    right kidney cancer  . Cancer [facility])    pancreatic  . Cholelithiasis 03/19/2018   noted on CT AB/Pelvis  . CKD (chronic kidney disease), stage IV [facility])    nephrologist-- dr Hollie Salk [name]);  05-01-2018 has not started dialysis, scheduled for AV fistula creation 05-07-2018  . Diabetes mellitus without complication ([place])   . Diverticulosis of colon 04/19/2018   Noted on CT abd/pelvis  . GERD (gastroesophageal reflux disease)   . Hepatic steatosis 03/19/2018   Mild diffuse, noted on CT  AB/Pelvis  . History of kidney stones   . History of pulmonary embolus (PE) 03/2008   treated with coumadin for 6 months  . History of sepsis 04/20/2018   per discharge note , probable UTI  . Hypertension   . IBS (irritable bowel syndrome)   . Nocturia   . OSA on CPAP    uses CPAP nightly  . Pancreatic lesion    0.4cm cystic per CT 07/ 2019  . Peripheral vascular disease ([place])   . Pneumonia    x 1  . Pulmonary nodule    Solid 16m Right Lower Lobe  . Right renal mass 04/10/2018   new dx--  scheduled for nephrectomy 05-16-2018  . S/P ureteral stent placement: 04/16/2018 04/19/2018    Current Outpatient Medications  Medication Sig Dispense Refill  . Accu-Chek FastClix Lancets MISC Use to check fasting blood sugar and 2 hrs after largest meal. 100 each 0  . ACCU-CHEK GUIDE test strip USE 1 STRIP TO CHECK FASTING BLOOD SUGAR AND 2 HOURS AFTER LARGEST MEAL 100 each 3  . acetaminophen (TYLENOL) 500 MG tablet Take 1,000 mg by mouth as needed for mild pain or headache.     . allopurinol (ZYLOPRIM) 100 MG tablet Take 1 tablet (100 mg total) by mouth every evening. 30 tablet 0  . amiodarone (PACERONE) 200 MG tablet Take 1 tablet (200 mg  total) by mouth daily. 90 tablet 3  . atorvastatin (LIPITOR) 40 MG tablet Take 1 tablet (40 mg total) by mouth every evening.    . blood glucose meter kit and supplies Dispense based on patient and insurance preference. Use up to four times daily as directed. (FOR ICD-10 E10.9, E11.9). 1 each 0  . calcitRIOL (ROCALTROL) 0.5 MCG capsule Take 1 capsule (0.5 mcg total) by mouth Every Tuesday,Thursday,and Saturday with dialysis. 12 capsule 1  . diphenhydramine-acetaminophen (TYLENOL PM) 25-500 MG TABS tablet Take 2 tablets by mouth at bedtime as needed (sleep).    . ferric citrate (AURYXIA) 1 GM 210 MG(Fe) tablet Take 1 tablet (210 mg total) by mouth daily with supper. 270 tablet   . fluticasone (FLONASE) 50 MCG/ACT nasal spray Place 1-2 sprays into both nostrils as  needed for allergies or rhinitis.     Marland Kitchen gabapentin (NEURONTIN) 100 MG capsule Take 1 capsule (100 mg total) by mouth 2 (two) times daily. Take 1 PO at night for 5 days then go to twice daily 60 capsule 2  . loratadine (CLARITIN) 10 MG tablet Take 10 mg by mouth every evening.     . midodrine (PROAMATINE) 10 MG tablet Take 1 tablet (10 mg total) by mouth every Tuesday, Thursday, and Saturday at 6 PM.    . nitroGLYCERIN (NITROSTAT) 0.4 MG SL tablet Place 1 tablet (0.4 mg total) under the tongue every 5 (five) minutes as needed for chest pain. 14 tablet 12  . omeprazole (PRILOSEC OTC) 20 MG tablet Take 40 mg by mouth daily before breakfast.     . oxyCODONE (OXY IR/ROXICODONE) 5 MG immediate release tablet Take 1-2 tablets (5-10 mg total) by mouth every 6 (six) hours as needed for severe pain. 30 tablet 0  . predniSONE (STERAPRED UNI-PAK 21 TAB) 10 MG (21) TBPK tablet Take 6 tablets (60 mg total) by mouth daily for 1 day, THEN 5 tablets (50 mg total) daily for 1 day, THEN 4 tablets (40 mg total) daily for 1 day, THEN 3 tablets (30 mg total) daily for 1 day, THEN 2 tablets (20 mg total) daily for 1 day, THEN 1 tablet (10 mg total) daily for 1 day. 21 tablet 0  . Sennosides (SENOKOT EXTRA STRENGTH) 17.2 MG TABS Take 17.2 mg by mouth daily as needed (constipation).    . warfarin (COUMADIN) 3 MG tablet Take 1.5 tablets (4.5 mg total) by mouth every Monday,Wednesday,Friday, and Sunday at 6 PM.    . warfarin (COUMADIN) 3 MG tablet Take 1 tablet (3 mg total) by mouth every Tuesday, Thursday, and Saturday at 6 PM.     No current facility-administered medications for this visit.    Physical Exam BP 124/71 (BP Location: Right Arm, Patient Position: Sitting)   Pulse (!) 115   Resp 20   Ht 6' 1" (1.854 m)   Wt 293 lb (132.9 kg)   SpO2 97% Comment: RA  BMI 38.66 kg/m  Obese 65 year old man in no acute distress Alert and oriented x3 Cardiac irregularly irregular tachycardic Lungs diminished at bases left  greater than right Incisions clean dry and intact  Diagnostic Tests: CHEST - 2 VIEW  COMPARISON:  10/19/2020.  PET-CT 09/16/2020.  FINDINGS: Mediastinum hilar structures normal. Heart size normal. Persistent left base atelectasis/infiltrate. Small left pleural effusion again noted. No pneumothorax noted on today's exam. Reference is made to PET-CT report for discussion of lung nodules present.  IMPRESSION: Persistent left base atelectasis/infiltrate. Small left pleural effusion again noted. No pneumothorax  noted on today's exam. Reference made of PET-CT report of 09/16/2020 for discussion of lung nodules present.   Electronically Signed   By: Marcello Moores  Register   On: 11/04/2020 15:27 I personally reviewed the chest x-ray.  There is a small left pleural effusion.  Impression: Collen Vincent is a 65 year old man with a past medical history of renal cell carcinoma, pancreatic abnormality on PET CT, bilateral lung nodules, history of pulmonary embolus, obesity, poorly controlled atrial fibrillation with RVR, chronic anticoagulation, obstructive sleep apnea, end-stage renal disease, hemodialysis, type 2 diabetes, reflux, and arthritis.   On his surveillance CTs he developed 2 lung nodules.  There was one in the lingula and 1 in the right lower lobe.  The lingular one was larger and more active on PET/CT.  Overall they had a very similar appearance.  I did a robotic assisted wedge resection of the lingular nodule on 10/16/2020.  His initial postoperative course was uncomplicated and he went home on day 3.  He has been having a lot of pain.  He says that tramadol was not working so he was taking large quantities of acetaminophen.  I strongly advised him against taking large doses of acetaminophen.  He should not exceed 4000 mg in a day.  He should not take more than 1000 mg at a time.  He understands that this can cause significant liver damage.  I would give him a prescription for  oxycodone 5 mg tablets he can take 1-2 of those every 6 hours as needed for pain.  30 tablets, no refills  I am also given start him on gabapentin to see if that will help him starting out with a lower dose of 182m nightly and if he tolerates that for 5 days then he will go to 100 mg twice daily.  We may be able to slowly ramp that up over time if it is helping.  He does have a small pleural effusion.  I am going to treat that with a steroid taper with prednisone.  Hopefully we can avoid thoracentesis particularly with him on Coumadin.  He will need oncology consultation.  He is to check with Dr. BGloriann Loanhis urologist has been following him.  I will defer that to him unless he would like me to do it.  We will need to decide what to do about the right lower lobe nodule.  Plan: Oxycodone as needed for pain Gabapentin Prednisone taper Return in 3 weeks with PA lateral chest x-ray  SMelrose Nakayama MD [facility] [phone]

## 2020-11-04 NOTE — Patient Instructions (Signed)
Continue taking warfarin 1 tablet (3mg ) daily except 1.5 (4.5mg )  tablet each Monday and Friday.  Repeat INR in 6 weeks.

## 2020-11-24 ENCOUNTER — Other Ambulatory Visit: Payer: Self-pay | Admitting: Thoracic Surgery (Cardiothoracic Vascular Surgery)

## 2020-11-24 DIAGNOSIS — R918 Other nonspecific abnormal finding of lung field: Secondary | ICD-10-CM

## 2020-11-25 ENCOUNTER — Ambulatory Visit (INDEPENDENT_AMBULATORY_CARE_PROVIDER_SITE_OTHER): Payer: Self-pay | Admitting: Thoracic Surgery (Cardiothoracic Vascular Surgery)

## 2020-11-25 ENCOUNTER — Encounter: Payer: Self-pay | Admitting: Thoracic Surgery (Cardiothoracic Vascular Surgery)

## 2020-11-25 ENCOUNTER — Other Ambulatory Visit: Payer: Self-pay

## 2020-11-25 ENCOUNTER — Ambulatory Visit
Admission: RE | Admit: 2020-11-25 | Discharge: 2020-11-25 | Disposition: A | Discharge status: Home / Self Care | Payer: Medicare Other | Source: Ambulatory Visit | Attending: Thoracic Surgery (Cardiothoracic Vascular Surgery) | Admitting: Thoracic Surgery (Cardiothoracic Vascular Surgery)

## 2020-11-25 VITALS — BP 99/70 | HR 120 | Resp 20 | Wt 294.0 lb

## 2020-11-25 DIAGNOSIS — R918 Other nonspecific abnormal finding of lung field: Secondary | ICD-10-CM

## 2020-11-25 DIAGNOSIS — Z9889 Other specified postprocedural states: Secondary | ICD-10-CM

## 2020-11-25 NOTE — Progress Notes (Signed)
[address]       ,[place] [address]             [phone]      HPI: Mr. Sandlin returns for follow-up after his recent wedge resection.  Zay Yeargan is a 65 year old man with a complicated medical history including renal cell carcinoma, pancreatic abnormality, bilateral lung nodules, history of PE, obesity, atrial fibrillation with RVR, chronic anticoagulation, obstructive sleep apnea, end-stage renal disease, hemodialysis, type 2 diabetes, reflux, and arthritis.  He had a right nephrectomy for stage IV renal cell carcinoma in 2019.  He had 2 lung nodules on follow-up CT.  Those had slowly increased in size over time.  On PET CT the lingular nodule was slightly larger and had an SUV of 3.8.  I did a robotic assisted wedge resection of the lingular nodule on 10/06/2020.  It turned out to be metastatic renal cell carcinoma.  His postoperative course was uncomplicated and he went home on day 3.  I saw him back in the office on 11/04/2020.  He had a small pleural effusion and some atelectasis left base.  He also was having a lot of incisional pain for which she was taking large doses of Tylenol.  Given prescription for oxycodone and told to cut back on the acetaminophen.  Since then his pain is improved dramatically.  He is no longer taking any narcotics.  He does still have some unusual sensations and bulging in the left upper quadrant.  He feels well.  Past Medical History:  Diagnosis Date  . Anemia   . Arthritis    knees  . Atrial fibrillation ([place])   . Bilateral renal cysts 03/23/2018   Noted MRI ABD  . Cancer [facility]    right kidney cancer  . Cancer [facility])    pancreatic  . Cholelithiasis 03/19/2018   noted on CT AB/Pelvis  . CKD (chronic kidney disease), stage IV [facility])    nephrologist-- dr Hollie Salk [name]);  05-01-2018 has not started dialysis, scheduled for AV fistula creation 05-07-2018  . Diabetes mellitus without complication ([place])   .  Diverticulosis of colon 04/19/2018   Noted on CT abd/pelvis  . GERD (gastroesophageal reflux disease)   . Hepatic steatosis 03/19/2018   Mild diffuse, noted on CT AB/Pelvis  . History of kidney stones   . History of pulmonary embolus (PE) 03/2008   treated with coumadin for 6 months  . History of sepsis 04/20/2018   per discharge note , probable UTI  . Hypertension   . IBS (irritable bowel syndrome)   . Nocturia   . OSA on CPAP    uses CPAP nightly  . Pancreatic lesion    0.4cm cystic per CT 07/ 2019  . Peripheral vascular disease ([place])   . Pneumonia    x 1  . Pulmonary nodule    Solid 46m Right Lower Lobe  . Right renal mass 04/10/2018   new dx--  scheduled for nephrectomy 05-16-2018  . S/P ureteral stent placement: 04/16/2018 04/19/2018    Current Outpatient Medications  Medication Sig Dispense Refill  . Accu-Chek FastClix Lancets MISC Use to check fasting blood sugar and 2 hrs after largest meal. 100 each 0  . ACCU-CHEK GUIDE test strip USE 1 STRIP TO CHECK FASTING BLOOD SUGAR AND 2 HOURS AFTER LARGEST MEAL 100 each 3  . acetaminophen (TYLENOL) 500 MG tablet Take 1,000 mg by mouth as needed for mild pain or headache.     .Marland Kitchen  allopurinol (ZYLOPRIM) 100 MG tablet Take 1 tablet (100 mg total) by mouth every evening. 30 tablet 0  . amiodarone (PACERONE) 200 MG tablet Take 1 tablet (200 mg total) by mouth daily. 90 tablet 3  . atorvastatin (LIPITOR) 40 MG tablet Take 1 tablet (40 mg total) by mouth every evening.    . blood glucose meter kit and supplies Dispense based on patient and insurance preference. Use up to four times daily as directed. (FOR ICD-10 E10.9, E11.9). 1 each 0  . calcitRIOL (ROCALTROL) 0.5 MCG capsule Take 1 capsule (0.5 mcg total) by mouth Every Tuesday,Thursday,and Saturday with dialysis. 12 capsule 1  . diphenhydramine-acetaminophen (TYLENOL PM) 25-500 MG TABS tablet Take 2 tablets by mouth at bedtime as needed (sleep).    . ferric citrate (AURYXIA) 1 GM 210  MG(Fe) tablet Take 1 tablet (210 mg total) by mouth daily with supper. 270 tablet   . fluticasone (FLONASE) 50 MCG/ACT nasal spray Place 1-2 sprays into both nostrils as needed for allergies or rhinitis.     Marland Kitchen gabapentin (NEURONTIN) 100 MG capsule Take 1 capsule (100 mg total) by mouth 2 (two) times daily. Take 1 PO at night for 5 days then go to twice daily 60 capsule 2  . loratadine (CLARITIN) 10 MG tablet Take 10 mg by mouth every evening.     . midodrine (PROAMATINE) 10 MG tablet Take 1 tablet (10 mg total) by mouth every Tuesday, Thursday, and Saturday at 6 PM.    . nitroGLYCERIN (NITROSTAT) 0.4 MG SL tablet Place 1 tablet (0.4 mg total) under the tongue every 5 (five) minutes as needed for chest pain. 14 tablet 12  . omeprazole (PRILOSEC OTC) 20 MG tablet Take 40 mg by mouth daily before breakfast.     . oxyCODONE (OXY IR/ROXICODONE) 5 MG immediate release tablet Take 1-2 tablets (5-10 mg total) by mouth every 6 (six) hours as needed for severe pain. 30 tablet 0  . Sennosides (SENOKOT EXTRA STRENGTH) 17.2 MG TABS Take 17.2 mg by mouth daily as needed (constipation).    . warfarin (COUMADIN) 3 MG tablet Take 1.5 tablets (4.5 mg total) by mouth every Monday,Wednesday,Friday, and Sunday at 6 PM.    . warfarin (COUMADIN) 3 MG tablet Take 1 tablet (3 mg total) by mouth every Tuesday, Thursday, and Saturday at 6 PM.     No current facility-administered medications for this visit.    Physical Exam BP 99/70 (BP Location: Right Arm, Patient Position: Sitting)   Pulse (!) 120   Resp 20   Wt 294 lb (133.4 kg)   SpO2 97%   BMI 38.32 kg/m  64 year old man in no acute distress Alert and oriented x3 with no focal deficits Cardiac tachy and irregular Lungs slightly diminished at left base but otherwise clear Incisions well-healed  Diagnostic Tests: CHEST - 2 VIEW  COMPARISON:  11/04/2020  FINDINGS: Frontal and lateral views of the chest demonstrate decreased left basilar consolidation,  with small residual left pleural effusion decreased since prior study pre right chest is clear. No pneumothorax. Cardiac silhouette is unremarkable.  IMPRESSION: 1. Persistent but decreased left basilar consolidation and left pleural effusion.   Electronically Signed   By: Randa Ngo M.D.   On: 11/25/2020 15:41 I personally reviewed the chest x-ray images.  There has been a decrease in the left pleural effusion.  There is still a small effusion present.  Impression: Fain Francis is a 65 year old man with a past history of renal cell carcinoma, pancreatic abnormality,  bilateral lung nodules, pulmonary embolus, obesity, A. fib with RVR, chronic anticoagulation, obesity, obstructive sleep apnea, renal failure, hemodialysis, type 2 diabetes, gastroesophageal reflux, and arthritis.  He had a nephrectomy for stage IV renal cell carcinoma in 2019.  There were 2 lung nodules that had slowly increased in size.  I did a wedge resection of the larger lingular nodule on 10/16/2020.  It turned out to be metastatic renal cell carcinoma.  There is still a nodule in the base of the right lower lobe which probably also represents metastatic renal cell.  At this point is doing well in regards to the surgery.  The effusion has improved and the aeration in the left base is better.  I think there is any further intervention required for that.  His pain has improved dramatically and he is not taking any narcotics.  He is not having any respiratory issues.  There remains the unanswered question of what to do with the right lower lobe lung nodule.  That would be easy enough to remove with a wedge resection.  However, I want to meet with Dr. Gloriann Loan and determine what the best option is for treatment of his metastatic renal cell carcinoma before we put him through another operation.  If he thinks we should resect it, I will be happy to do so.  Plan:  Follow-up with Dr. Gloriann Loan on 12/04/2020 Return in 3 weeks to  discuss management of right lower lobe nodule   [place] Nakayama, MD [facility] [phone]

## 2020-11-30 ENCOUNTER — Other Ambulatory Visit: Payer: Self-pay

## 2020-11-30 ENCOUNTER — Ambulatory Visit (INDEPENDENT_AMBULATORY_CARE_PROVIDER_SITE_OTHER): Payer: Medicare Other | Admitting: Physician Assistant

## 2020-11-30 ENCOUNTER — Encounter: Payer: Self-pay | Admitting: Physician Assistant

## 2020-11-30 VITALS — BP 107/62 | HR 76 | Temp 96.8°F | Ht 73.0 in | Wt 298.3 lb

## 2020-11-30 DIAGNOSIS — N186 End stage renal disease: Secondary | ICD-10-CM | POA: Diagnosis not present

## 2020-11-30 DIAGNOSIS — E1122 Type 2 diabetes mellitus with diabetic chronic kidney disease: Secondary | ICD-10-CM | POA: Diagnosis not present

## 2020-11-30 DIAGNOSIS — R300 Dysuria: Secondary | ICD-10-CM | POA: Diagnosis not present

## 2020-11-30 DIAGNOSIS — Z992 Dependence on renal dialysis: Secondary | ICD-10-CM

## 2020-11-30 DIAGNOSIS — C641 Malignant neoplasm of right kidney, except renal pelvis: Secondary | ICD-10-CM | POA: Diagnosis not present

## 2020-11-30 DIAGNOSIS — N3090 Cystitis, unspecified without hematuria: Secondary | ICD-10-CM

## 2020-11-30 DIAGNOSIS — Z794 Long term (current) use of insulin: Secondary | ICD-10-CM

## 2020-11-30 DIAGNOSIS — E1169 Type 2 diabetes mellitus with other specified complication: Secondary | ICD-10-CM

## 2020-11-30 DIAGNOSIS — I10 Essential (primary) hypertension: Secondary | ICD-10-CM

## 2020-11-30 DIAGNOSIS — E785 Hyperlipidemia, unspecified: Secondary | ICD-10-CM

## 2020-11-30 LAB — POCT URINALYSIS DIPSTICK
Glucose, UA: NEGATIVE
Ketones, UA: NEGATIVE
Nitrite, UA: NEGATIVE
Protein, UA: POSITIVE — AB
Spec Grav, UA: 1.025 (ref 1.010–1.025)
Urobilinogen, UA: 0.2 E.U./dL
pH, UA: 5.5 (ref 5.0–8.0)

## 2020-11-30 LAB — POCT GLYCOSYLATED HEMOGLOBIN (HGB A1C)
HbA1c POC (<> result, manual entry): 5.3 % (ref 4.0–5.6)
Hemoglobin A1C: 5.3 % (ref 4.0–5.6)

## 2020-11-30 LAB — POCT UA - MICROALBUMIN
Creatinine, POC: 300 mg/dL
Microalbumin Ur, POC: 150 mg/L

## 2020-11-30 MED ORDER — CIPROFLOXACIN HCL 500 MG PO TABS
500.0000 mg | ORAL_TABLET | Freq: Every day | ORAL | 0 refills | Status: DC
Start: 2020-11-30 — End: 2020-12-14

## 2020-11-30 NOTE — Progress Notes (Signed)
Established Patient Office Visit  Subjective:  [name]., male    DOB: [birth year]  Age: 65 y.o. MRN: [id number]  CC:  Chief Complaint  Patient presents with  . Follow-up  . Hyperlipidemia  . Hypertension  . Diabetes    HPI George Lewis. presents for follow up on diabetes mellitus, hypertension, and hyperlipidemia. Patient has c/o back pain and dysuria. States symptoms started Saturday evening (2 days ago). Denies fever, chills, lower abd pain, N/V.  Diabetes: Pt denies increased urination or thirst. Pt managing with diet. No hypoglycemic events. Checking glucose at home. Reports FBS range 90-120, was on corticosteroid therapy and glucose increased to the 200s range but has returned to baseline. Diet has not changes, continues to monitor carbohydrates and glucose.  HTN: Pt denies chest pain, palpitations, dizziness or edema. Patient takes Midodrine for hypotensive episodes during dialysis. Checks BP at home and readings range <130/80. Pt has been monitoring fluid intake due to lung fluid.  HLD: Pt taking medication as directed without issues. Reports has increased protein and eating more chicken and fish. Continues to eat vegetables and fruits.     Past Medical History:  Diagnosis Date  . Anemia   . Arthritis    knees  . Atrial fibrillation ([place])   . Bilateral renal cysts 03/23/2018   Noted MRI ABD  . Cancer [facility])    right kidney cancer  . Cancer [facility])    pancreatic  . Cholelithiasis 03/19/2018   noted on CT AB/Pelvis  . CKD (chronic kidney disease), stage IV [facility])    nephrologist-- dr Hollie Salk [name]);  05-01-2018 has not started dialysis, scheduled for AV fistula creation 05-07-2018  . Diabetes mellitus without complication ([place])   . Diverticulosis of colon 04/19/2018   Noted on CT abd/pelvis  . GERD (gastroesophageal reflux disease)   . Hepatic steatosis 03/19/2018   Mild diffuse, noted on CT AB/Pelvis  . History of kidney  stones   . History of pulmonary embolus (PE) 03/2008   treated with coumadin for 6 months  . History of sepsis 04/20/2018   per discharge note , probable UTI  . Hypertension   . IBS (irritable bowel syndrome)   . Nocturia   . OSA on CPAP    uses CPAP nightly  . Pancreatic lesion    0.4cm cystic per CT 07/ 2019  . Peripheral vascular disease ([place])   . Pneumonia    x 1  . Pulmonary nodule    Solid 42m Right Lower Lobe  . Right renal mass 04/10/2018   new dx--  scheduled for nephrectomy 05-16-2018  . S/P ureteral stent placement: 04/16/2018 04/19/2018    Past Surgical History:  Procedure Laterality Date  . AV FISTULA PLACEMENT Left 05/07/2018   Procedure: Creation of Left arm Radiocephalic Fistula;  Surgeon: CMarty Heck MD;  [place]  Service: Vascular;  Laterality: Left;  . AV FISTULA PLACEMENT Left 05/15/2019   Procedure: Creation of Brachiocephalic fistula left arm;  Surgeon: CWaynetta Sandy MD;  [place]  Service: Vascular;  Laterality: Left;  . BASCILIC VEIN TRANSPOSITION Left 07/15/2019   Procedure: BASILIC VEIN TRANSPOSITION SECOND STAGE;  Surgeon: CWaynetta Sandy MD;  [place]  Service: Vascular;  Laterality: Left;  . BRONCHIAL BRUSHINGS  08/25/2020   Procedure: BRONCHIAL BRUSHINGS;  Surgeon: IGarner Nash DO;  [facility];  Service: Pulmonary;;  . BRONCHIAL NEEDLE ASPIRATION BIOPSY  08/25/2020   Procedure: BRONCHIAL  NEEDLE ASPIRATION BIOPSIES;  Surgeon: Garner Nash, DO;  Location: [place] [facility];  Service: Pulmonary;;  . BRONCHIAL WASHINGS  08/25/2020   Procedure: BRONCHIAL WASHINGS;  Surgeon: Garner Nash, DO;  Location: [place] [facility];  Service: Pulmonary;;  . COLONOSCOPY    . CYSTOSCOPY/RETROGRADE/URETEROSCOPY/STONE EXTRACTION WITH BASKET  x2 last one 1990s approx.  . CYSTOSCOPY/URETEROSCOPY/HOLMIUM LASER/STENT PLACEMENT Left 04/16/2018   Procedure: CYSTOSCOPY/LEFT URETEROSCOPY/LEFT RETROGRADE/STENT  PLACEMENT;  Surgeon: Lucas Mallow, MD;  [facility];  Service: Urology;  Laterality: Left;  . EUS N/A 05/03/2018   Procedure: UPPER ENDOSCOPIC ULTRASOUND (EUS) RADIAL;  Surgeon: Milus Banister, MD;  [facility];  Service: Gastroenterology;  Laterality: N/A;  . EUS N/A 05/03/2018   Procedure: UPPER ENDOSCOPIC ULTRASOUND (EUS) LINEAR;  Surgeon: Milus Banister, MD;  [facility];  Service: Gastroenterology;  Laterality: N/A;  . EUS  10/05/2020   upper GI  . EXTRACORPOREAL SHOCK WAVE LITHOTRIPSY  x3  last one 2004 approx.  Marland Kitchen FINE NEEDLE ASPIRATION N/A 05/03/2018   Procedure: FINE NEEDLE ASPIRATION (FNA) LINEAR;  Surgeon: Milus Banister, MD;  [facility];  Service: Gastroenterology;  Laterality: N/A;  . INTERCOSTAL NERVE BLOCK Left 10/16/2020   Procedure: INTERCOSTAL NERVE BLOCK;  Surgeon: Melrose Nakayama, MD;  Location: [place];  Service: Thoracic;  Laterality: Left;  . LAPAROSCOPIC NEPHRECTOMY, HAND ASSISTED Right 05/16/2018   Procedure: HAND ASSISTED LAPAROSCOPIC RIGHT NEPHRECTOMY;  Surgeon: Lucas Mallow, MD;  [facility];  Service: Urology;  Laterality: Right;  . left index finger attachment  1980s   3-4 surgeries  . NODE DISSECTION Left 10/16/2020   Procedure: NODE DISSECTION;  Surgeon: Melrose Nakayama, MD;  Location: [place];  Service: Thoracic;  Laterality: Left;  . TOE SURGERY  1980s   in beween 2nd and 3rd toes cyst removed  . UPPER GI ENDOSCOPY    . VIDEO BRONCHOSCOPY WITH ENDOBRONCHIAL NAVIGATION Bilateral 08/25/2020   Procedure: VIDEO BRONCHOSCOPY WITH ENDOBRONCHIAL NAVIGATION;  Surgeon: Garner Nash, DO;  Location: [place];  Service: Pulmonary;  Laterality: Bilateral;    Family History  Problem Relation Age of Onset  . Alzheimer's disease Mother   . Alzheimer's disease Father   . Heart disease Father   . Heart attack Father   . Cancer - Other Sister     Social History   Socioeconomic History  . Marital  status: Married    Spouse name: Not on file  . Number of children: Not on file  . Years of education: Not on file  . Highest education level: Not on file  Occupational History  . Occupation: disabled  Tobacco Use  . Smoking status: Never Smoker  . Smokeless tobacco: Never Used  Vaping Use  . Vaping Use: Never used  Substance and Sexual Activity  . Alcohol use: Never  . Drug use: Never  . Sexual activity: Not on file  Other Topics Concern  . Not on file  Social History Narrative  . Not on file   Social Determinants of Health   Financial Resource Strain: Not on file  Food Insecurity: Not on file  Transportation Needs: Not on file  Physical Activity: Not on file  Stress: Not on file  Social Connections: Not on file  Intimate Partner Violence: Not on file    Outpatient Medications Prior to Visit  Medication Sig Dispense Refill  . Accu-Chek FastClix Lancets MISC Use to check fasting blood sugar and 2 hrs after largest meal. 100 each  0  . ACCU-CHEK GUIDE test strip USE 1 STRIP TO CHECK FASTING BLOOD SUGAR AND 2 HOURS AFTER LARGEST MEAL 100 each 3  . acetaminophen (TYLENOL) 500 MG tablet Take 1,000 mg by mouth as needed for mild pain or headache.     . allopurinol (ZYLOPRIM) 100 MG tablet Take 1 tablet (100 mg total) by mouth every evening. 30 tablet 0  . amiodarone (PACERONE) 200 MG tablet Take 1 tablet (200 mg total) by mouth daily. 90 tablet 3  . atorvastatin (LIPITOR) 40 MG tablet Take 1 tablet (40 mg total) by mouth every evening.    . blood glucose meter kit and supplies Dispense based on patient and insurance preference. Use up to four times daily as directed. (FOR ICD-10 E10.9, E11.9). 1 each 0  . calcitRIOL (ROCALTROL) 0.5 MCG capsule Take 1 capsule (0.5 mcg total) by mouth Every Tuesday,Thursday,and Saturday with dialysis. 12 capsule 1  . diphenhydramine-acetaminophen (TYLENOL PM) 25-500 MG TABS tablet Take 2 tablets by mouth at bedtime as needed (sleep).    . ferric  citrate (AURYXIA) 1 GM 210 MG(Fe) tablet Take 1 tablet (210 mg total) by mouth daily with supper. 270 tablet   . gabapentin (NEURONTIN) 100 MG capsule Take 1 capsule (100 mg total) by mouth 2 (two) times daily. Take 1 PO at night for 5 days then go to twice daily 60 capsule 2  . loratadine (CLARITIN) 10 MG tablet Take 10 mg by mouth every evening.     . midodrine (PROAMATINE) 10 MG tablet Take 1 tablet (10 mg total) by mouth every Tuesday, Thursday, and Saturday at 6 PM.    . omeprazole (PRILOSEC OTC) 20 MG tablet Take 40 mg by mouth daily before breakfast.     . warfarin (COUMADIN) 3 MG tablet Take 1.5 tablets (4.5 mg total) by mouth every Monday,Wednesday,Friday, and Sunday at 6 PM.    . warfarin (COUMADIN) 3 MG tablet Take 1 tablet (3 mg total) by mouth every Tuesday, Thursday, and Saturday at 6 PM.    . fluticasone (FLONASE) 50 MCG/ACT nasal spray Place 1-2 sprays into both nostrils as needed for allergies or rhinitis.  (Patient not taking: Reported on 11/30/2020)    . nitroGLYCERIN (NITROSTAT) 0.4 MG SL tablet Place 1 tablet (0.4 mg total) under the tongue every 5 (five) minutes as needed for chest pain. (Patient not taking: Reported on 11/30/2020) 14 tablet 12  . oxyCODONE (OXY IR/ROXICODONE) 5 MG immediate release tablet Take 1-2 tablets (5-10 mg total) by mouth every 6 (six) hours as needed for severe pain. 30 tablet 0  . Sennosides (SENOKOT EXTRA STRENGTH) 17.2 MG TABS Take 17.2 mg by mouth daily as needed (constipation). (Patient not taking: Reported on 11/30/2020)     No facility-administered medications prior to visit.    Allergies  Allergen Reactions  . Penicillins Rash and Other (See Comments)    Has patient had a PCN reaction causing immediate rash, facial/tongue/throat swelling, SOB or lightheadedness with hypotension: yes Has patient had a PCN reaction causing severe rash involving mucus membranes or skin necrosis: no Has patient had a PCN reaction that required hospitalization:  no Has patient had a PCN reaction occurring within the last 10 years: no If all of the above answers are "NO", then may proceed with Cephalosporin use.     ROS Review of Systems A fourteen system review of systems was performed and found to be positive as per HPI.   Objective:    Physical Exam General:  Pleasant  and cooperative, in no acute distress  Neuro:  Alert and oriented,  extra-ocular muscles intact  HEENT:  Normocephalic, atraumatic, neck supple  Skin:  no gross rash, warm, pink.  Cardiac:  RRR Respiratory:  ECTA B/L w/o wheezing, crackles or rales, dec breath sounds. Not using accessory muscles, speaking in full sentences- unlabored. Vascular:  Ext warm, no cyanosis apprec.; cap RF less 2 sec. Psych:  No HI/SI, judgement and insight good, Euthymic mood. Full Affect.  BP 107/62   Pulse 76   Temp (!) 96.8 F (36 C)   Ht '6\' 1"'  (1.854 m)   Wt 298 lb 4.8 oz (135.3 kg)   SpO2 100%   BMI 39.36 kg/m  Wt Readings from Last 3 Encounters:  11/30/20 298 lb 4.8 oz (135.3 kg)  11/25/20 294 lb (133.4 kg)  11/04/20 293 lb (132.9 kg)     Health Maintenance Due  Topic Date Due  . Hepatitis C Screening  Never done  . PNEUMOCOCCAL POLYSACCHARIDE VACCINE AGE 42-64 HIGH RISK  Never done  . TETANUS/TDAP  Never done  . COLONOSCOPY (Pts 45-63yr Insurance coverage will need to be confirmed)  Never done    There are no preventive care reminders to display for this patient.  Lab Results  Component Value Date   TSH 1.659 10/07/2019   Lab Results  Component Value Date   WBC 12.6 (H) 10/18/2020   HGB 9.2 (L) 10/18/2020   HCT 28.6 (L) 10/18/2020   MCV 99.0 10/18/2020   PLT 203 10/18/2020   Lab Results  Component Value Date   NA 134 (L) 10/18/2020   K 3.5 10/18/2020   CO2 20 (L) 10/18/2020   GLUCOSE 77 10/18/2020   BUN 73 (H) 10/18/2020   CREATININE 10.81 (H) 10/18/2020   BILITOT 0.6 10/18/2020   ALKPHOS 39 10/18/2020   AST 15 10/18/2020   ALT 14 10/18/2020   PROT 6.5  10/18/2020   ALBUMIN 3.4 (L) 10/18/2020   CALCIUM 8.5 (L) 10/18/2020   ANIONGAP 21 (H) 10/18/2020   Lab Results  Component Value Date   CHOL 177 08/05/2020   Lab Results  Component Value Date   HDL 32 (L) 08/05/2020   Lab Results  Component Value Date   LDLCALC 106 (H) 08/05/2020   Lab Results  Component Value Date   TRIG 224 (H) 08/05/2020   Lab Results  Component Value Date   CHOLHDL 5.5 (H) 08/05/2020   Lab Results  Component Value Date   HGBA1C 5.3 11/30/2020   HGBA1C 5.3 11/30/2020      Assessment & Plan:   Problem List Items Addressed This Visit      Cardiovascular and Mediastinum   HTN (hypertension)    -Controlled. -Recommend to continue with Midodrine for hypotension. -Continue to monitor fluid intake and sodium. -Will continue to monitor.        Endocrine   Type 2 diabetes mellitus with chronic kidney disease on chronic dialysis, with long-term current use of insulin ([place] [facility]    -Well controlled with diet, A1c today 5.3..Marland Kitchen -Continue to monitor carbohydrates and glucose. -Continue ambulatory glucose monitoring. -Will continue to monitor. -UA microalbumin performed today, A:C 30-300 mg/g, stable       Relevant Orders   POCT HgB A1C (Completed)   POCT UA - Microalbumin (Completed)   Comp Met (CMET)   CBC w/Diff     Genitourinary   Renal cell carcinoma, right ([place]    -Followed by Urology and Cardiothoracic.  -S/p right nephrectomy  in 2019. -Patient is s/p wedge resection of lingular nodule 10/06/20 and has upcoming followup with Dr. Gloriann Loan to discuss right lung nodule.      Relevant Medications   ciprofloxacin (CIPRO) 500 MG tablet    Other Visit Diagnoses    Hyperlipidemia associated with type 2 diabetes mellitus ([place])       Relevant Orders   Lipid Profile   Dysuria       Relevant Orders   POCT urinalysis dipstick (Completed)   Urine Culture   Cystitis       Relevant Medications   ciprofloxacin (CIPRO) 500 MG tablet      Hyperlipidemia associated with type 2 diabetes mellitus: -Last lipid panel: total cholesterol 177, triglycerides 224, HDL 32, LDL 106 -Will repeat lipid panel and hepatic function today.  -Continue current medication regimen and heart healthy diet. -Will continue to monitor.  Cystitis: -UA obtained: negative for glucose, ketones, nitrites, and small bilirubin, trace of blood, and small leukocytes. -Given patient symptoms and medical history will start empiric antibiotic therapy with Cipro 500 mg once daily x 5 days. Pending urine culture will make treatment adjustments if indicated. -Advised to discuss with Urologist recurrent UTI's.   Meds ordered this encounter  Medications  . ciprofloxacin (CIPRO) 500 MG tablet    Sig: Take 1 tablet (500 mg total) by mouth daily with breakfast.    Dispense:  5 tablet    Refill:  0    Order Specific Question:   Supervising Provider    Answer:   Beatrice Lecher D [[id number]]    Follow-up: Return in about 4 months (around 04/01/2021) for DM, HTN, HLD.   Note:  This note was prepared with assistance of Dragon voice recognition software. Occasional wrong-word or sound-a-like substitutions may have occurred due to the inherent limitations of voice recognition software.  Lorrene Reid, PA-C

## 2020-11-30 NOTE — Assessment & Plan Note (Signed)
-  Well controlled with diet, A1c today 5.3.Marland Kitchen- -Continue to monitor carbohydrates and glucose. -Continue ambulatory glucose monitoring. -Will continue to monitor. -UA microalbumin performed today, A:C 30-300 mg/g, stable

## 2020-11-30 NOTE — Assessment & Plan Note (Addendum)
-  Followed by Urology and Cardiothoracic.  -S/p right nephrectomy in 2019. -Patient is s/p wedge resection of lingular nodule 10/06/20 and has upcoming followup with Dr. Gloriann Loan to discuss right lung nodule.

## 2020-11-30 NOTE — Assessment & Plan Note (Signed)
-  Controlled. -Recommend to continue with Midodrine for hypotension. -Continue to monitor fluid intake and sodium. -Will continue to monitor.

## 2020-12-01 LAB — LIPID PANEL
Chol/HDL Ratio: 5.8 ratio — ABNORMAL HIGH (ref 0.0–5.0)
Cholesterol, Total: 197 mg/dL (ref 100–199)
HDL: 34 mg/dL — ABNORMAL LOW (ref >39)
LDL Chol Calc (NIH): 122 mg/dL — ABNORMAL HIGH (ref 0–99)
Triglycerides: 232 mg/dL — ABNORMAL HIGH (ref 0–149)
VLDL Cholesterol Cal: 41 mg/dL — ABNORMAL HIGH (ref 5–40)

## 2020-12-01 LAB — COMPREHENSIVE METABOLIC PANEL
ALT: 13 IU/L (ref 0–44)
AST: 14 IU/L (ref 0–40)
Albumin/Globulin Ratio: 1.7 (ref 1.2–2.2)
Albumin: 4.3 g/dL (ref 3.8–4.8)
Alkaline Phosphatase: 57 IU/L (ref 44–121)
BUN/Creatinine Ratio: 7 — ABNORMAL LOW (ref 10–24)
BUN: 54 mg/dL — ABNORMAL HIGH (ref 8–27)
Bilirubin Total: 0.3 mg/dL (ref 0.0–1.2)
CO2: 20 mmol/L (ref 20–29)
Calcium: 8.6 mg/dL (ref 8.6–10.2)
Chloride: 95 mmol/L — ABNORMAL LOW (ref 96–106)
Creatinine, Ser: 8.13 mg/dL — ABNORMAL HIGH (ref 0.76–1.27)
Globulin, Total: 2.5 g/dL (ref 1.5–4.5)
Glucose: 122 mg/dL — ABNORMAL HIGH (ref 65–99)
Potassium: 4.6 mmol/L (ref 3.5–5.2)
Sodium: 140 mmol/L (ref 134–144)
Total Protein: 6.8 g/dL (ref 6.0–8.5)
eGFR: 7 mL/min/{1.73_m2} — ABNORMAL LOW (ref >59)

## 2020-12-01 LAB — CBC WITH DIFFERENTIAL/PLATELET
Basophils Absolute: 0.1 10*3/uL (ref 0.0–0.2)
Basos: 1 %
EOS (ABSOLUTE): 0.2 10*3/uL (ref 0.0–0.4)
Eos: 2 %
Hematocrit: 30.5 % — ABNORMAL LOW (ref 37.5–51.0)
Hemoglobin: 10.1 g/dL — ABNORMAL LOW (ref 13.0–17.7)
Immature Grans (Abs): 0.1 10*3/uL (ref 0.0–0.1)
Immature Granulocytes: 1 %
Lymphocytes Absolute: 1.3 10*3/uL (ref 0.7–3.1)
Lymphs: 12 %
MCH: 30.6 pg (ref 26.6–33.0)
MCHC: 33.1 g/dL (ref 31.5–35.7)
MCV: 92 fL (ref 79–97)
Monocytes Absolute: 0.8 10*3/uL (ref 0.1–0.9)
Monocytes: 8 %
Neutrophils Absolute: 7.8 10*3/uL — ABNORMAL HIGH (ref 1.4–7.0)
Neutrophils: 76 %
Platelets: 237 10*3/uL (ref 150–450)
RBC: 3.3 x10E6/uL — ABNORMAL LOW (ref 4.14–5.80)
RDW: 13.2 % (ref 11.6–15.4)
WBC: 10.3 10*3/uL (ref 3.4–10.8)

## 2020-12-05 LAB — URINE CULTURE

## 2020-12-08 ENCOUNTER — Telehealth: Payer: Self-pay | Admitting: Oncology

## 2020-12-08 NOTE — Telephone Encounter (Signed)
Received a new pt referral from Dr. Gloriann Loan for repeat CT staging of the chest, abdomen and pelvis. Mr. Parcel has been scheduled to see Dr. Alen Blew on 4/13 at 2pm. Appt date and time has been given to the pt's wife. Aware to arrive 20 minutes early.

## 2020-12-09 ENCOUNTER — Inpatient Hospital Stay: Payer: Medicare Other | Attending: Oncology | Admitting: Oncology

## 2020-12-09 ENCOUNTER — Other Ambulatory Visit: Payer: Self-pay

## 2020-12-09 VITALS — BP 100/63 | HR 125 | Temp 98.6°F | Resp 16 | Ht 73.0 in | Wt 293.0 lb

## 2020-12-09 DIAGNOSIS — E032 Hypothyroidism due to medicaments and other exogenous substances: Secondary | ICD-10-CM | POA: Diagnosis not present

## 2020-12-09 DIAGNOSIS — C641 Malignant neoplasm of right kidney, except renal pelvis: Secondary | ICD-10-CM

## 2020-12-09 NOTE — Progress Notes (Signed)
Reason for the request:    Kidney cancer  HPI: I was asked by Dr. Gloriann Loan to evaluate George Lewis for the evaluation of renal cell carcinoma.  He is a 65 year old man with history of atrial fibrillation and chronic kidney disease as well as diabetes.  He was diagnosed with kidney cancer after presenting with flank pain and hematuria in July 2019.  CT scan showed a 10.9 x 6.7 x 8.8 cm right kidney mass suspicious for neoplasm.  He underwent laparoscopic radical nephrectomy completed on May 16, 2018 under the care of Dr. Gloriann Loan.  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  Histological grade 4.  He did not receive any additional therapy and remained on active surveillance.  He developed to lung nodules in the right lobe as well as in the lingula.  These have monitored over period of time and has grown at that time.  PET scan obtained September 16, 2020 showed a mildly hypermetabolic 1.4 cm lingular lesion as well as 0.9 cm right lower lobe nodule.  Based on these findings he underwent robotic assisted left upper lobe wedge resection completed by Dr. Roxan Hockey on October 16, 2020.  The final pathology showed clear-cell renal cell carcinoma with negative margins.  He is currently under evaluation for possible resection of the right lower lobe nodule.  Clinically, he is reporting symptoms of fatigue and tiredness related to his dialysis at this time.  His overall performance status quality of life is maintained.  Denies any respiratory issues with a cough, wheezing complications.  He does not report any headaches, blurry vision, syncope or seizures. Does not report any fevers, chills or sweats.  Does not report any cough, wheezing or hemoptysis.  Does not report any chest pain, palpitation, orthopnea or leg edema.  Does not report any nausea, vomiting or abdominal pain.  Does not report any constipation or diarrhea.   Does not report any skeletal complaints.    Does not report frequency, urgency or hematuria.  Does not report any skin rashes or lesions. Does not report any heat or cold intolerance.  Does not report any lymphadenopathy or petechiae.  Does not report any anxiety or depression.  Remaining review of systems is negative.    Past Medical History:  Diagnosis Date  . Anemia   . Arthritis    knees  . Atrial fibrillation ([place])   . Bilateral renal cysts 03/23/2018   Noted MRI ABD  . Cancer [facility])    right kidney cancer  . Cancer [facility])    pancreatic  . Cholelithiasis 03/19/2018   noted on CT AB/Pelvis  . CKD (chronic kidney disease), stage IV [facility])    nephrologist-- dr Hollie Salk [name]);  05-01-2018 has not started dialysis, scheduled for AV fistula creation 05-07-2018  . Diabetes mellitus without complication ([place])   . Diverticulosis of colon 04/19/2018   Noted on CT abd/pelvis  . GERD (gastroesophageal reflux disease)   . Hepatic steatosis 03/19/2018   Mild diffuse, noted on CT AB/Pelvis  . History of kidney stones   . History of pulmonary embolus (PE) 03/2008   treated with coumadin for 6 months  . History of sepsis 04/20/2018   per discharge note , probable UTI  . Hypertension   . IBS (irritable bowel syndrome)   . Nocturia   . OSA on CPAP    uses CPAP nightly  . Pancreatic lesion    0.4cm cystic per  CT 07/ 2019  . Peripheral vascular disease ([place])   . Pneumonia    x 1  . Pulmonary nodule    Solid 41m Right Lower Lobe  . Right renal mass 04/10/2018   new dx--  scheduled for nephrectomy 05-16-2018  . S/P ureteral stent placement: 04/16/2018 04/19/2018  :  Past Surgical History:  Procedure Laterality Date  . AV FISTULA PLACEMENT Left 05/07/2018   Procedure: Creation of Left arm Radiocephalic Fistula;  Surgeon: CMarty Heck MD;  [place] [facility]  Service: Vascular;  Laterality: Left;  . AV FISTULA PLACEMENT Left 05/15/2019   Procedure: Creation of Brachiocephalic  fistula left arm;  Surgeon: CWaynetta Sandy MD;  [place]  Service: Vascular;  Laterality: Left;  . BASCILIC VEIN TRANSPOSITION Left 07/15/2019   Procedure: BASILIC VEIN TRANSPOSITION SECOND STAGE;  Surgeon: CWaynetta Sandy MD;  [place] [facility]  Service: Vascular;  Laterality: Left;  . BRONCHIAL BRUSHINGS  08/25/2020   Procedure: BRONCHIAL BRUSHINGS;  Surgeon: IGarner Nash DO;  [facility];  Service: Pulmonary;;  . BRONCHIAL NEEDLE ASPIRATION BIOPSY  08/25/2020   Procedure: BRONCHIAL NEEDLE ASPIRATION BIOPSIES;  Surgeon: IGarner Nash DO;  [facility];  Service: Pulmonary;;  . BRONCHIAL WASHINGS  08/25/2020   Procedure: BRONCHIAL WASHINGS;  Surgeon: IGarner Nash DO;  [place];  Service: Pulmonary;;  . COLONOSCOPY    . CYSTOSCOPY/RETROGRADE/URETEROSCOPY/STONE EXTRACTION WITH BASKET  x2 last one 1990s approx.  . CYSTOSCOPY/URETEROSCOPY/HOLMIUM LASER/STENT PLACEMENT Left 04/16/2018   Procedure: CYSTOSCOPY/LEFT URETEROSCOPY/LEFT RETROGRADE/STENT PLACEMENT;  Surgeon: BLucas Mallow MD;  [facility];  Service: Urology;  Laterality: Left;  . EUS N/A 05/03/2018   Procedure: UPPER ENDOSCOPIC ULTRASOUND (EUS) RADIAL;  Surgeon: JMilus Banister MD;  [facility];  Service: Gastroenterology;  Laterality: N/A;  . EUS N/A 05/03/2018   Procedure: UPPER ENDOSCOPIC ULTRASOUND (EUS) LINEAR;  Surgeon: JMilus Banister MD;  [facility];  Service: Gastroenterology;  Laterality: N/A;  . EUS  10/05/2020   upper GI  . EXTRACORPOREAL SHOCK WAVE LITHOTRIPSY  x3  last one 2004 approx.  .Marland KitchenFINE NEEDLE ASPIRATION N/A 05/03/2018   Procedure: FINE NEEDLE ASPIRATION (FNA) LINEAR;  Surgeon: JMilus Banister MD;  [facility];  Service: Gastroenterology;  Laterality: N/A;  . INTERCOSTAL NERVE BLOCK Left 10/16/2020   Procedure: INTERCOSTAL NERVE BLOCK;  Surgeon: HMelrose Nakayama MD;  [place] [facility]   Service: Thoracic;  Laterality: Left;  . LAPAROSCOPIC NEPHRECTOMY, HAND ASSISTED Right 05/16/2018   Procedure: HAND ASSISTED LAPAROSCOPIC RIGHT NEPHRECTOMY;  Surgeon: BLucas Mallow MD;  [facility];  Service: Urology;  Laterality: Right;  . left index finger attachment  1980s   3-4 surgeries  . NODE DISSECTION Left 10/16/2020   Procedure: NODE DISSECTION;  Surgeon: HMelrose Nakayama MD;  [place] [facility]  Service: Thoracic;  Laterality: Left;  . TOE SURGERY  1980s   in beween 2nd and 3rd toes cyst removed  . UPPER GI ENDOSCOPY    . VIDEO BRONCHOSCOPY WITH ENDOBRONCHIAL NAVIGATION Bilateral 08/25/2020   Procedure: VIDEO BRONCHOSCOPY WITH ENDOBRONCHIAL NAVIGATION;  Surgeon: IGarner Nash DO;  [place] [facility]  Service: Pulmonary;  Laterality: Bilateral;  :   Current Outpatient Medications:  .  Accu-Chek FastClix Lancets MISC, Use to check fasting blood sugar and 2 hrs after largest meal., Disp: 100 each, Rfl: 0 .  ACCU-CHEK GUIDE test strip, USE 1 STRIP TO CHECK FASTING BLOOD SUGAR AND 2 HOURS  AFTER LARGEST MEAL, Disp: 100 each, Rfl: 3 .  acetaminophen (TYLENOL) 500 MG tablet, Take 1,000 mg by mouth as needed for mild pain or headache. , Disp: , Rfl:  .  allopurinol (ZYLOPRIM) 100 MG tablet, Take 1 tablet (100 mg total) by mouth every evening., Disp: 30 tablet, Rfl: 0 .  amiodarone (PACERONE) 200 MG tablet, Take 1 tablet (200 mg total) by mouth daily., Disp: 90 tablet, Rfl: 3 .  atorvastatin (LIPITOR) 40 MG tablet, Take 1 tablet (40 mg total) by mouth every evening., Disp: , Rfl:  .  blood glucose meter kit and supplies, Dispense based on patient and insurance preference. Use up to four times daily as directed. (FOR ICD-10 E10.9, E11.9)., Disp: 1 each, Rfl: 0 .  calcitRIOL (ROCALTROL) 0.5 MCG capsule, Take 1 capsule (0.5 mcg total) by mouth Every Tuesday,Thursday,and Saturday with dialysis., Disp: 12 capsule, Rfl: 1 .  ciprofloxacin (CIPRO) 500 MG tablet, Take 1 tablet  (500 mg total) by mouth daily with breakfast., Disp: 5 tablet, Rfl: 0 .  diphenhydramine-acetaminophen (TYLENOL PM) 25-500 MG TABS tablet, Take 2 tablets by mouth at bedtime as needed (sleep)., Disp: , Rfl:  .  ferric citrate (AURYXIA) 1 GM 210 MG(Fe) tablet, Take 1 tablet (210 mg total) by mouth daily with supper., Disp: 270 tablet, Rfl:  .  fluticasone (FLONASE) 50 MCG/ACT nasal spray, Place 1-2 sprays into both nostrils as needed for allergies or rhinitis.  (Patient not taking: Reported on 11/30/2020), Disp: , Rfl:  .  gabapentin (NEURONTIN) 100 MG capsule, Take 1 capsule (100 mg total) by mouth 2 (two) times daily. Take 1 PO at night for 5 days then go to twice daily, Disp: 60 capsule, Rfl: 2 .  loratadine (CLARITIN) 10 MG tablet, Take 10 mg by mouth every evening. , Disp: , Rfl:  .  midodrine (PROAMATINE) 10 MG tablet, Take 1 tablet (10 mg total) by mouth every Tuesday, Thursday, and Saturday at 6 PM., Disp: , Rfl:  .  nitroGLYCERIN (NITROSTAT) 0.4 MG SL tablet, Place 1 tablet (0.4 mg total) under the tongue every 5 (five) minutes as needed for chest pain. (Patient not taking: Reported on 11/30/2020), Disp: 14 tablet, Rfl: 12 .  omeprazole (PRILOSEC OTC) 20 MG tablet, Take 40 mg by mouth daily before breakfast. , Disp: , Rfl:  .  oxyCODONE (OXY IR/ROXICODONE) 5 MG immediate release tablet, Take 1-2 tablets (5-10 mg total) by mouth every 6 (six) hours as needed for severe pain., Disp: 30 tablet, Rfl: 0 .  Sennosides (SENOKOT EXTRA STRENGTH) 17.2 MG TABS, Take 17.2 mg by mouth daily as needed (constipation). (Patient not taking: Reported on 11/30/2020), Disp: , Rfl:  .  warfarin (COUMADIN) 3 MG tablet, Take 1.5 tablets (4.5 mg total) by mouth every Monday,Wednesday,Friday, and Sunday at 6 PM., Disp: , Rfl:  .  warfarin (COUMADIN) 3 MG tablet, Take 1 tablet (3 mg total) by mouth every Tuesday, Thursday, and Saturday at 6 PM., Disp: , Rfl: :  Allergies  Allergen Reactions  . Penicillins Rash and Other  (See Comments)    Has patient had a PCN reaction causing immediate rash, facial/tongue/throat swelling, SOB or lightheadedness with hypotension: yes Has patient had a PCN reaction causing severe rash involving mucus membranes or skin necrosis: no Has patient had a PCN reaction that required hospitalization: no Has patient had a PCN reaction occurring within the last 10 years: no If all of the above answers are "NO", then may proceed with Cephalosporin use.   :  Family History  Problem Relation Age of Onset  . Alzheimer's disease Mother   . Alzheimer's disease Father   . Heart disease Father   . Heart attack Father   . Cancer - Other Sister   :  Social History   Socioeconomic History  . Marital status: Married    Spouse name: Not on file  . Number of children: Not on file  . Years of education: Not on file  . Highest education level: Not on file  Occupational History  . Occupation: disabled  Tobacco Use  . Smoking status: Never Smoker  . Smokeless tobacco: Never Used  Vaping Use  . Vaping Use: Never used  Substance and Sexual Activity  . Alcohol use: Never  . Drug use: Never  . Sexual activity: Not on file  Other Topics Concern  . Not on file  Social History Narrative  . Not on file   Social Determinants of Health   Financial Resource Strain: Not on file  Food Insecurity: Not on file  Transportation Needs: Not on file  Physical Activity: Not on file  Stress: Not on file  Social Connections: Not on file  Intimate Partner Violence: Not on file  :  Pertinent items are noted in HPI.  Exam:  General appearance: alert and cooperative appeared without distress. Head: atraumatic without any abnormalities. Eyes: conjunctivae/corneas clear. PERRL.  Sclera anicteric. Throat: lips, mucosa, and tongue normal; without oral thrush or ulcers. Resp: clear to auscultation bilaterally without rhonchi, wheezes or dullness to percussion. Cardio: regular rate and rhythm, S1, S2  normal, no murmur, click, rub or gallop GI: soft, non-tender; bowel sounds normal; no masses,  no organomegaly Skin: Skin color, texture, turgor normal. No rashes or lesions Lymph nodes: Cervical, supraclavicular, and axillary nodes normal. Neurologic: Grossly normal without any motor, sensory or deep tendon reflexes. Musculoskeletal: No joint deformity or effusion.    DG Chest 2 View  Result Date: 11/25/2020 CLINICAL DATA:  Lung nodules, previous abnormal chest x-ray EXAM: CHEST - 2 VIEW COMPARISON:  11/04/2020 FINDINGS: Frontal and lateral views of the chest demonstrate decreased left basilar consolidation, with small residual left pleural effusion decreased since prior study pre right chest is clear. No pneumothorax. Cardiac silhouette is unremarkable. IMPRESSION: 1. Persistent but decreased left basilar consolidation and left pleural effusion. Electronically Signed   By: Randa Ngo M.D.   On: 11/25/2020 15:41    Assessment and Plan:   65 year old with:  1.  Stage IV clear-cell renal cell carcinoma documented in February 2022.  He was found to have follow-up 2 lung nodules one in the lingula and 1 right lower lobe.  He is status post resection of his left lung nodule after his original nephrectomy in September 2019.  His disease status was updated and treatment options were reviewed at this time.  He is currently under evaluation for possible resection of his remaining right lung nodule.  At this time, I am in favor of proceeding with local therapy for the remaining lung nodule with her would be surgical resection or radiation if surgery cannot be completed.  After he is rendered disease-free we can discuss additional systemic therapy.  The role for Pembrolizumab immunotherapy in the adjuvant setting for resected kidney cancer was discussed.  Risks and benefits of this approach was discussed as well as discussing the data that led to the approval of this drug for this indication.   Complication associated with this treatment including nausea, fatigue, dermatitis and autoimmune complications.  After  discussion today, he wants to be aggressive and proceed with high aggressive measures after surgical resection of his pulmonary nodule.  I will arrange for the start of adjuvant immunotherapy every 3 to 6 weeks to complete 1 year of therapy.  2.  IV access: Peripheral veins will be used initially before consideration for any additional IV access needed.  3.  Immune mediated complications: These were discussed today including pneumonitis, colitis or thyroid disease.  4.  Antiemetics: Prescription for Compazine will be made available to him.  5.  Follow-up: Will be after recovery from his lung surgery for the start of immunotherapy.  60  minutes were dedicated to this visit. The time was spent on reviewing laboratory data, imaging studies, discussing treatment options, discussing pathology results and answering questions regarding future plan.    A copy of this consult has been forwarded to the requesting physician.

## 2020-12-09 NOTE — Progress Notes (Signed)
START OFF PATHWAY REGIMEN - Renal Cell   [id number]:Pembrolizumab 200 mg IV D1 q21 Days:   A cycle is every 21 days:     Pembrolizumab   **Always confirm dose/schedule in your pharmacy ordering system**  Patient Characteristics: Oligometastatic Disease Therapeutic Status: Oligometastatic Disease Intent of Therapy: Curative Intent, Discussed with Patient

## 2020-12-14 ENCOUNTER — Encounter: Payer: Self-pay | Admitting: Thoracic Surgery (Cardiothoracic Vascular Surgery)

## 2020-12-14 ENCOUNTER — Ambulatory Visit (INDEPENDENT_AMBULATORY_CARE_PROVIDER_SITE_OTHER): Payer: Self-pay | Admitting: Thoracic Surgery (Cardiothoracic Vascular Surgery)

## 2020-12-14 ENCOUNTER — Other Ambulatory Visit: Payer: Self-pay

## 2020-12-14 ENCOUNTER — Other Ambulatory Visit: Payer: Self-pay | Admitting: Thoracic Surgery (Cardiothoracic Vascular Surgery)

## 2020-12-14 VITALS — BP 104/55 | HR 112 | Temp 98.1°F | Resp 20 | Ht 73.0 in | Wt 293.4 lb

## 2020-12-14 DIAGNOSIS — R911 Solitary pulmonary nodule: Secondary | ICD-10-CM

## 2020-12-14 DIAGNOSIS — R918 Other nonspecific abnormal finding of lung field: Secondary | ICD-10-CM

## 2020-12-14 NOTE — Progress Notes (Signed)
[address]       [place] [address]             [phone]     HPI: Mr. George Lewis returns to discuss management of his right lower lobe lung nodule.  George Lewis is a 65 year old man with a history of renal cell carcinoma (stage IV), pancreatic abnormality on PET/CT, bilateral lung nodules, pulmonary embolus, obesity, poorly controlled atrial fibrillation with RVR, chronic anticoagulation, obstructive sleep apnea, end-stage renal disease, hemodialysis, type 2 diabetes, reflux, and arthritis.  He had a right nephrectomy for stage IV renal cell carcinoma in 2019.  On follow-up CT he had 2 lung nodules that slowly increased in size over time.  One was in the lingula and the other was in the right lower lobe.  He was being evaluated for possible renal transplantation.  I did a wedge resection of the lingular nodule on 10/16/2020 which showed metastatic renal cell carcinoma.  He did well postoperatively and went home on day 3.  He was seen back in the office on 11/04/2020 and again on 11/25/2020.  He was having a lot of pain initially but on his follow-up he was much better.  Since then he saw Dr. Alen Blew.  He recommended treatment with pembrolizumab.  He was in favor of resecting the right lower lobe nodule prior to initiating treatment.  Past Medical History:  Diagnosis Date  . Anemia   . Arthritis    knees  . Atrial fibrillation ([place])   . Bilateral renal cysts 03/23/2018   Noted MRI ABD  . Cancer [facility])    right kidney cancer  . Cancer [facility])    pancreatic  . Cholelithiasis 03/19/2018   noted on CT AB/Pelvis  . CKD (chronic kidney disease), stage IV [facility])    nephrologist-- dr Hollie Salk [name]);  05-01-2018 has not started dialysis, scheduled for AV fistula creation 05-07-2018  . Diabetes mellitus without complication ([place])   . Diverticulosis of colon 04/19/2018   Noted on CT abd/pelvis  . GERD (gastroesophageal reflux disease)   . Hepatic  steatosis 03/19/2018   Mild diffuse, noted on CT AB/Pelvis  . History of kidney stones   . History of pulmonary embolus (PE) 03/2008   treated with coumadin for 6 months  . History of sepsis 04/20/2018   per discharge note , probable UTI  . Hypertension   . IBS (irritable bowel syndrome)   . Nocturia   . OSA on CPAP    uses CPAP nightly  . Pancreatic lesion    0.4cm cystic per CT 07/ 2019  . Peripheral vascular disease ([place])   . Pneumonia    x 1  . Pulmonary nodule    Solid 65m Right Lower Lobe  . Right renal mass 04/10/2018   new dx--  scheduled for nephrectomy 05-16-2018  . S/P ureteral stent placement: 04/16/2018 04/19/2018   Past Surgical History:  Procedure Laterality Date  . AV FISTULA PLACEMENT Left 05/07/2018   Procedure: Creation of Left arm Radiocephalic Fistula;  Surgeon: CMarty Heck MD;  [place]  Service: Vascular;  Laterality: Left;  . AV FISTULA PLACEMENT Left 05/15/2019   Procedure: Creation of Brachiocephalic fistula left arm;  Surgeon: CWaynetta Sandy MD;  [place] [facility]  Service: Vascular;  Laterality: Left;  . BASCILIC VEIN TRANSPOSITION Left 07/15/2019   Procedure: BASILIC VEIN TRANSPOSITION SECOND STAGE;  Surgeon: CWaynetta Sandy MD;  [place] [facility]  Service: Vascular;  Laterality: Left;  . BRONCHIAL BRUSHINGS  08/25/2020   Procedure: BRONCHIAL BRUSHINGS;  Surgeon: Garner Nash, DO;  Location: [place] [facility];  Service: Pulmonary;;  . BRONCHIAL NEEDLE ASPIRATION BIOPSY  08/25/2020   Procedure: BRONCHIAL NEEDLE ASPIRATION BIOPSIES;  Surgeon: Garner Nash, DO;  Location: [place] [facility];  Service: Pulmonary;;  . BRONCHIAL WASHINGS  08/25/2020   Procedure: BRONCHIAL WASHINGS;  Surgeon: Garner Nash, DO;  Location: [place] [facility];  Service: Pulmonary;;  . COLONOSCOPY    . CYSTOSCOPY/RETROGRADE/URETEROSCOPY/STONE EXTRACTION WITH BASKET  x2 last one 1990s approx.  . CYSTOSCOPY/URETEROSCOPY/HOLMIUM LASER/STENT PLACEMENT  Left 04/16/2018   Procedure: CYSTOSCOPY/LEFT URETEROSCOPY/LEFT RETROGRADE/STENT PLACEMENT;  Surgeon: Lucas Mallow, MD;  [facility];  Service: Urology;  Laterality: Left;  . EUS N/A 05/03/2018   Procedure: UPPER ENDOSCOPIC ULTRASOUND (EUS) RADIAL;  Surgeon: Milus Banister, MD;  [facility];  Service: Gastroenterology;  Laterality: N/A;  . EUS N/A 05/03/2018   Procedure: UPPER ENDOSCOPIC ULTRASOUND (EUS) LINEAR;  Surgeon: Milus Banister, MD;  [facility];  Service: Gastroenterology;  Laterality: N/A;  . EUS  10/05/2020   upper GI  . EXTRACORPOREAL SHOCK WAVE LITHOTRIPSY  x3  last one 2004 approx.  Marland Kitchen FINE NEEDLE ASPIRATION N/A 05/03/2018   Procedure: FINE NEEDLE ASPIRATION (FNA) LINEAR;  Surgeon: Milus Banister, MD;  [facility];  Service: Gastroenterology;  Laterality: N/A;  . INTERCOSTAL NERVE BLOCK Left 10/16/2020   Procedure: INTERCOSTAL NERVE BLOCK;  Surgeon: Melrose Nakayama, MD;  Location: [place];  Service: Thoracic;  Laterality: Left;  . LAPAROSCOPIC NEPHRECTOMY, HAND ASSISTED Right 05/16/2018   Procedure: HAND ASSISTED LAPAROSCOPIC RIGHT NEPHRECTOMY;  Surgeon: Lucas Mallow, MD;  [facility];  Service: Urology;  Laterality: Right;  . left index finger attachment  1980s   3-4 surgeries  . NODE DISSECTION Left 10/16/2020   Procedure: NODE DISSECTION;  Surgeon: Melrose Nakayama, MD;  Location: [place];  Service: Thoracic;  Laterality: Left;  . TOE SURGERY  1980s   in beween 2nd and 3rd toes cyst removed  . UPPER GI ENDOSCOPY    . VIDEO BRONCHOSCOPY WITH ENDOBRONCHIAL NAVIGATION Bilateral 08/25/2020   Procedure: VIDEO BRONCHOSCOPY WITH ENDOBRONCHIAL NAVIGATION;  Surgeon: Garner Nash, DO;  Location: [place];  Service: Pulmonary;  Laterality: Bilateral;    Current Outpatient Medications  Medication Sig Dispense Refill  . Accu-Chek FastClix Lancets MISC Use to check fasting blood sugar and 2 hrs after largest meal. 100  each 0  . ACCU-CHEK GUIDE test strip USE 1 STRIP TO CHECK FASTING BLOOD SUGAR AND 2 HOURS AFTER LARGEST MEAL 100 each 3  . acetaminophen (TYLENOL) 500 MG tablet Take 1,000 mg by mouth as needed for mild pain or headache.     . allopurinol (ZYLOPRIM) 100 MG tablet Take 1 tablet (100 mg total) by mouth every evening. 30 tablet 0  . amiodarone (PACERONE) 200 MG tablet Take 1 tablet (200 mg total) by mouth daily. 90 tablet 3  . atorvastatin (LIPITOR) 40 MG tablet Take 1 tablet (40 mg total) by mouth every evening.    . blood glucose meter kit and supplies Dispense based on patient and insurance preference. Use up to four times daily as directed. (FOR ICD-10 E10.9, E11.9). 1 each 0  . calcitRIOL (ROCALTROL) 0.5 MCG capsule Take 1 capsule (0.5 mcg total) by mouth Every Tuesday,Thursday,and Saturday with dialysis. 12 capsule 1  . diphenhydramine-acetaminophen (TYLENOL PM) 25-500 MG TABS tablet Take 2 tablets by mouth at bedtime  as needed (sleep).    . ferric citrate (AURYXIA) 1 GM 210 MG(Fe) tablet Take 1 tablet (210 mg total) by mouth daily with supper. 270 tablet   . fluticasone (FLONASE) 50 MCG/ACT nasal spray Place 1-2 sprays into both nostrils as needed for allergies or rhinitis.    Marland Kitchen gabapentin (NEURONTIN) 100 MG capsule Take 1 capsule (100 mg total) by mouth 2 (two) times daily. Take 1 PO at night for 5 days then go to twice daily 60 capsule 2  . loratadine (CLARITIN) 10 MG tablet Take 10 mg by mouth every evening.     . midodrine (PROAMATINE) 10 MG tablet Take 1 tablet (10 mg total) by mouth every Tuesday, Thursday, and Saturday at 6 PM.    . nitroGLYCERIN (NITROSTAT) 0.4 MG SL tablet Place 1 tablet (0.4 mg total) under the tongue every 5 (five) minutes as needed for chest pain. 14 tablet 12  . omeprazole (PRILOSEC OTC) 20 MG tablet Take 40 mg by mouth daily before breakfast.     . oxyCODONE (OXY IR/ROXICODONE) 5 MG immediate release tablet Take 1-2 tablets (5-10 mg total) by mouth every 6 (six)  hours as needed for severe pain. 30 tablet 0  . Sennosides (SENOKOT EXTRA STRENGTH) 17.2 MG TABS Take 17.2 mg by mouth daily as needed (constipation).    . warfarin (COUMADIN) 3 MG tablet Take 1.5 tablets (4.5 mg total) by mouth every Monday,Wednesday,Friday, and Sunday at 6 PM.    . warfarin (COUMADIN) 3 MG tablet Take 1 tablet (3 mg total) by mouth every Tuesday, Thursday, and Saturday at 6 PM.     No current facility-administered medications for this visit.    Physical Exam BP (!) 104/55 (BP Location: Right Arm, Patient Position: Sitting, Cuff Size: Large)   Pulse (!) 112   Temp 98.1 F (36.7 C) (Skin)   Resp 20   Ht _0  (1.854 m)   Wt 293 lb 6.4 oz (133.1 kg)   SpO2 100% Comment: RA  BMI 38.71 kg/m  Obese 65 year old man in no acute distress Alert and oriented x3 with no focal motor deficits Cardiac irregular and tachycardic Lungs clear bilaterally Incisions well-healed Abdomen benign No peripheral edema  Diagnostic Tests: NUCLEAR MEDICINE PET SKULL BASE TO THIGH  TECHNIQUE: 14.3 mCi F-18 FDG was injected intravenously. Full-ring PET imaging was performed from the skull base to thigh after the radiotracer. CT data was obtained and used for attenuation correction and anatomic localization.  Fasting blood glucose: 109 mg/dl  COMPARISON:  07/29/2020 chest CT.  08/05/2019 PET-CT.  FINDINGS: Mediastinal blood pool activity: SUV max 3.4  Liver activity: SUV max NA  NECK: No hypermetabolic lymph nodes in the neck.  Incidental CT findings: none  CHEST:  Mildly hypermetabolic solid 1.4 cm lingular pulmonary nodule with max SUV 3.8 (series 8/image 39), previously 1.0 cm with max SUV 0.7 on 08/05/2019 PET-CT, increased in size and metabolism.  Medial basilar right lower lobe 0.9 cm solid pulmonary nodule with low level uptake with max SUV 1.6 (series 8/image 48), previously 0.5 cm with max SUV 1.1, increased in size and metabolism.  No enlarged or  hypermetabolic axillary, mediastinal or hilar lymph nodes.  Incidental CT findings: Coronary atherosclerosis.  ABDOMEN/PELVIS:  Hypermetabolic solid 1.0 cm pancreatic neck lesion near SMV with max SUV 8.7 (series 4/image 123), previously 1.0 cm with max SUV 12.4, stable in size and decreased in metabolism.  No evidence of recurrent mass in the right nephrectomy bed.  No abnormal hypermetabolic activity  within the liver, adrenal glands, or spleen. No hypermetabolic lymph nodes in the abdomen or pelvis.  Incidental CT findings: Diffuse hepatic steatosis. Minimally atherosclerotic nonaneurysmal abdominal aorta. Moderate left colonic diverticulosis.  SKELETON: No focal hypermetabolic activity to suggest skeletal metastasis.  Incidental CT findings: none  IMPRESSION: 1. Mildly hypermetabolic solid 1.4 cm lingula and 0.9 cm medial basilar right lower lobe pulmonary nodules, both increased in size and metabolism since 08/05/2019 PET-CT, suspicious for indolent malignancy. Slow growing renal cell carcinoma metastases are favored. 2. Hypermetabolic 1.0 cm pancreatic neck lesion, stable in size and decreased in metabolism since 08/05/2019 PET-CT, compatible with biopsy-proven neuroendocrine tumor. 3. No additional sites of hypermetabolic metastatic disease. No evidence of tumor recurrence in the right nephrectomy bed. 4. Chronic findings include: Aortic Atherosclerosis (ICD10-I70.0). Coronary atherosclerosis. Diffuse hepatic steatosis. Moderate left colonic diverticulosis.   Electronically Signed   By: Ilona Sorrel M.D.   On: 09/16/2020 10:39 I personally reviewed the PET/CT images.  Findings as noted above.  Impression: George Lewis is a 65 year old man with a complicated past medical history.  It includes stage IV renal cell carcinoma, pancreatic abnormality on PET/CT, bilateral lung nodules, pulmonary embolus, obesity, poorly controlled atrial fibrillation with  RVR, chronic anticoagulation, obstructive sleep apnea, end-stage renal disease, hemodialysis, type 2 diabetes, reflux, and arthritis.  He was being evaluated for possible renal transplant.  He had to lung nodules that were being followed and had increased in size over time.  They are not particularly hypermetabolic on PET/CT.  There was a lingular nodule as well as a medial basilar right lower lobe nodule.  I did a robotic wedge resection of the lingular nodule which turned out to be metastatic renal cell carcinoma.  Unfortunately he is not a candidate for renal transplant.  He currently has oligometastatic disease with a single nodule at the base of the right lower lobe.  This is a peripheral nodule that is amenable to wedge resection.  He discussed this with Dr. Alen Blew and he is in favor of surgical removal and then treatment with pembrolizumab.  I discussed with Mr. and Mrs. Alvis that there is no guarantee that surgical resection will impact his survival in any way.  There is possible benefit, but is difficult to but an exact number on it.  He understands the procedure to remove it would be equivalent to the previous operation he had.  We would plan to do a robotic assisted right VATS for wedge resection of right lower lobe lung nodule.  He is familiar with the procedure in terms of the incisions to be used, the use of general anesthesia, the use of a drainage tube postoperatively, the expected hospital stay, and the recovery.  He understands that he could have more or less pain or a shorter or longer hospitalization this time around.  He understands that the risks include, but are not limited to death, MI, DVT, PE, bleeding, possible need for transfusion, infection, air leaks, cardiac arrhythmias, as well as possibility of other possible complications.  It has been about 3 months since his PET/CT.  We do need to do another CT of the chest, abdomen and pelvis to make sure that there is not any other  sites of disease.  I would not want him to go through this operation if it was not going to eliminate all known disease.  Plan: Repeat CT of chest, abdomen and pelvis to rule out any other sites of disease. Robotic right lower lobe wedge resection on Thursday,  12/31/2020.  Melrose Nakayama, MD [facility] [phone]

## 2020-12-15 ENCOUNTER — Encounter: Payer: Self-pay | Admitting: *Deleted

## 2020-12-15 ENCOUNTER — Ambulatory Visit (INDEPENDENT_AMBULATORY_CARE_PROVIDER_SITE_OTHER): Payer: Medicare Other

## 2020-12-15 ENCOUNTER — Other Ambulatory Visit: Payer: Self-pay | Admitting: *Deleted

## 2020-12-15 DIAGNOSIS — I480 Paroxysmal atrial fibrillation: Secondary | ICD-10-CM

## 2020-12-15 DIAGNOSIS — Z7901 Long term (current) use of anticoagulants: Secondary | ICD-10-CM | POA: Diagnosis not present

## 2020-12-15 DIAGNOSIS — R911 Solitary pulmonary nodule: Secondary | ICD-10-CM

## 2020-12-15 LAB — POCT INR: INR: 3.5 — AB (ref 2.0–3.0)

## 2020-12-15 NOTE — Patient Instructions (Signed)
Hold tonight only and then Continue taking warfarin 1 tablet (3mg ) daily except 1.5 (4.5mg )  tablet each Monday and Friday.  Repeat INR in 8 weeks.

## 2020-12-25 ENCOUNTER — Ambulatory Visit
Admission: RE | Admit: 2020-12-25 | Discharge: 2020-12-25 | Disposition: A | Discharge status: Home / Self Care | Payer: Medicare Other | Source: Ambulatory Visit | Attending: Thoracic Surgery (Cardiothoracic Vascular Surgery) | Admitting: Thoracic Surgery (Cardiothoracic Vascular Surgery)

## 2020-12-25 ENCOUNTER — Other Ambulatory Visit: Payer: Medicare Other

## 2020-12-25 DIAGNOSIS — R918 Other nonspecific abnormal finding of lung field: Secondary | ICD-10-CM

## 2020-12-25 MED ORDER — IOPAMIDOL (ISOVUE-300) INJECTION 61%
100.0000 mL | Freq: Once | INTRAVENOUS | Status: AC | PRN
  Administered 2020-12-25: 100 mL via INTRAVENOUS

## 2020-12-29 NOTE — Progress Notes (Signed)
Surgical Instructions    Your procedure is scheduled on Friday, May 6th, 2022.  Report to [facility] Main Entrance "A" at 05:45 A.M., then check in with the Admitting office.  Call this number if you have problems the morning of surgery:  [phone]   If you have any questions prior to your surgery date call [phone]: Open Monday-Friday 8am-4pm    Remember:  Do not eat or drink after midnight the night before your surgery   Take these medicines the morning of surgery with A SIP OF WATER   omeprazole (PRILOSEC OTC)  If needed:  acetaminophen (TYLENOL)  fluticasone (FLONASE)  nitroGLYCERIN (NITROSTAT) - please, let the nurse know if you used it traMADol (ULTRAM)  Follow your surgeon's instructions on when to stop warfarin (COUMADIN).  If no instructions were given by your surgeon then you will need to call the office to get those instructions.    As of today, STOP taking any Aspirin (unless otherwise instructed by your surgeon) Aleve, Naproxen, Ibuprofen, Motrin, Advil, Goody's, BC's, all herbal medications, fish oil, and all vitamins.   WHAT DO I DO ABOUT MY DIABETES MEDICATION?  . THE NIGHT BEFORE SURGERY, take 50% of your dinner/night dose of insulin glargine (LANTUS).     . THE MORNING OF SURGERY, take take 50% of your morning dose of insulin glargine (LANTUS).  HOW TO MANAGE YOUR DIABETES BEFORE AND AFTER SURGERY  Why is it important to control my blood sugar before and after surgery? . Improving blood sugar levels before and after surgery helps healing and can limit problems. . A way of improving blood sugar control is eating a healthy diet by: o  Eating less sugar and carbohydrates o  Increasing activity/exercise o  Talking with your doctor about reaching your blood sugar goals . High blood sugars (greater than 180 mg/dL) can raise your risk of infections and slow your recovery, so you will need to focus on controlling your diabetes during the weeks before  surgery. . Make sure that the doctor who takes care of your diabetes knows about your planned surgery including the date and location.  How do I manage my blood sugar before surgery? . Check your blood sugar at least 4 times a day, starting 2 days before surgery, to make sure that the level is not too high or low. . Check your blood sugar the morning of your surgery when you wake up and every 2 hours until you get to the Short Stay unit. o If your blood sugar is less than 70 mg/dL, you will need to treat for low blood sugar: - Do not take insulin. - Treat a low blood sugar (less than 70 mg/dL) with  cup of clear juice (cranberry or apple), 4 glucose tablets, OR glucose gel. - Recheck blood sugar in 15 minutes after treatment (to make sure it is greater than 70 mg/dL). If your blood sugar is not greater than 70 mg/dL on recheck, call [phone] for further instructions. . Report your blood sugar to the short stay nurse when you get to Short Stay.  . If you are admitted to the hospital after surgery: o Your blood sugar will be checked by the staff and you will probably be given insulin after surgery (instead of oral diabetes medicines) to make sure you have good blood sugar levels. o The goal for blood sugar control after surgery is 80-180 mg/dL.  Do not wear jewelry, make up, or nail polish            Do not wear lotions, powders, perfumes/colognes, or deodorant.            Do not shave 48 hours prior to surgery.  Men may shave face and neck.            Do not bring valuables to the hospital.            [facility] is not responsible for any belongings or valuables.  Do NOT Smoke (Tobacco/Vaping) or drink Alcohol 24 hours prior to your procedure If you use a CPAP at night, you may bring all equipment for your overnight stay.   Contacts, glasses, dentures or bridgework may not be worn into surgery, please bring cases for these belongings   For patients admitted to the  hospital, discharge time will be determined by your treatment team.   Patients discharged the day of surgery will not be allowed to drive home, and someone needs to stay with them for 24 hours.    Special instructions:   [facility]- Preparing For Surgery  Before surgery, you can play an important role. Because skin is not sterile, your skin needs to be as free of germs as possible. You can reduce the number of germs on your skin by washing with CHG (chlorahexidine gluconate) Soap before surgery.  CHG is an antiseptic cleaner which kills germs and bonds with the skin to continue killing germs even after washing.    Oral Hygiene is also important to reduce your risk of infection.  Remember - BRUSH YOUR TEETH THE MORNING OF SURGERY WITH YOUR REGULAR TOOTHPASTE  Please do not use if you have an allergy to CHG or antibacterial soaps. If your skin becomes reddened/irritated stop using the CHG.  Do not shave (including legs and underarms) for at least 48 hours prior to first CHG shower. It is OK to shave your face.  Please follow these instructions carefully.   1. Shower the NIGHT BEFORE SURGERY and the MORNING OF SURGERY  2. If you chose to wash your hair, wash your hair first as usual with your normal shampoo.  3. After you shampoo, rinse your hair and body thoroughly to remove the shampoo.  4. Use CHG Soap as you would any other liquid soap. You can apply CHG directly to the skin and wash gently with a scrungie or a clean washcloth.   5. Apply the CHG Soap to your body ONLY FROM THE NECK DOWN.  Do not use on open wounds or open sores. Avoid contact with your eyes, ears, mouth and genitals (private parts). Wash Face and genitals (private parts)  with your normal soap.   6. Wash thoroughly, paying special attention to the area where your surgery will be performed.  7. Thoroughly rinse your body with warm water from the neck down.  8. DO NOT shower/wash with your normal soap after using and  rinsing off the CHG Soap.  9. Pat yourself dry with a CLEAN TOWEL.  10. Wear CLEAN PAJAMAS to bed the night before surgery  11. Place CLEAN SHEETS on your bed the night before your surgery  12. DO NOT SLEEP WITH PETS.   Day of Surgery: Take a shower with CHG soap. Wear Clean/Comfortable clothing the morning of surgery Do not apply any deodorants/lotions.   Remember to brush your teeth WITH YOUR REGULAR TOOTHPASTE.   Please read over the following fact sheets  that you were given.

## 2020-12-30 ENCOUNTER — Ambulatory Visit (HOSPITAL_COMMUNITY)
Admission: RE | Admit: 2020-12-30 | Discharge: 2020-12-30 | Disposition: A | Discharge status: Home / Self Care | Payer: Medicare Other | Source: Ambulatory Visit | Attending: Thoracic Surgery (Cardiothoracic Vascular Surgery) | Admitting: Thoracic Surgery (Cardiothoracic Vascular Surgery)

## 2020-12-30 ENCOUNTER — Encounter (HOSPITAL_COMMUNITY)
Admission: RE | Admit: 2020-12-30 | Discharge: 2020-12-30 | Disposition: A | Discharge status: Home / Self Care | Payer: Medicare Other | Source: Ambulatory Visit | Attending: Thoracic Surgery (Cardiothoracic Vascular Surgery) | Admitting: Thoracic Surgery (Cardiothoracic Vascular Surgery)

## 2020-12-30 ENCOUNTER — Encounter (HOSPITAL_COMMUNITY): Payer: Self-pay

## 2020-12-30 ENCOUNTER — Other Ambulatory Visit: Payer: Self-pay

## 2020-12-30 ENCOUNTER — Other Ambulatory Visit (HOSPITAL_COMMUNITY)
Admission: RE | Admit: 2020-12-30 | Discharge: 2020-12-30 | Disposition: A | Discharge status: Home / Self Care | Payer: Medicare Other | Source: Ambulatory Visit | Attending: Thoracic Surgery (Cardiothoracic Vascular Surgery) | Admitting: Thoracic Surgery (Cardiothoracic Vascular Surgery)

## 2020-12-30 ENCOUNTER — Encounter (HOSPITAL_COMMUNITY): Payer: Self-pay | Admitting: Vascular Surgery

## 2020-12-30 DIAGNOSIS — R911 Solitary pulmonary nodule: Secondary | ICD-10-CM | POA: Insufficient documentation

## 2020-12-30 DIAGNOSIS — J90 Pleural effusion, not elsewhere classified: Secondary | ICD-10-CM | POA: Diagnosis not present

## 2020-12-30 DIAGNOSIS — Z20822 Contact with and (suspected) exposure to covid-19: Secondary | ICD-10-CM | POA: Diagnosis not present

## 2020-12-30 DIAGNOSIS — Z01818 Encounter for other preprocedural examination: Secondary | ICD-10-CM | POA: Diagnosis present

## 2020-12-30 DIAGNOSIS — Z01812 Encounter for preprocedural laboratory examination: Secondary | ICD-10-CM | POA: Insufficient documentation

## 2020-12-30 HISTORY — DX: Cardiac arrhythmia, unspecified: I49.9

## 2020-12-30 LAB — TYPE AND SCREEN
ABO/RH(D): A POS
Antibody Screen: NEGATIVE

## 2020-12-30 LAB — COMPREHENSIVE METABOLIC PANEL
ALT: 15 U/L (ref 0–44)
AST: 21 U/L (ref 15–41)
Albumin: 3.7 g/dL (ref 3.5–5.0)
Alkaline Phosphatase: 59 U/L (ref 38–126)
Anion gap: 18 — ABNORMAL HIGH (ref 5–15)
BUN: 37 mg/dL — ABNORMAL HIGH (ref 8–23)
CO2: 23 mmol/L (ref 22–32)
Calcium: 8.7 mg/dL — ABNORMAL LOW (ref 8.9–10.3)
Chloride: 95 mmol/L — ABNORMAL LOW (ref 98–111)
Creatinine, Ser: 6.56 mg/dL — ABNORMAL HIGH (ref 0.61–1.24)
GFR, Estimated: 9 mL/min — ABNORMAL LOW (ref >60)
Glucose, Bld: 146 mg/dL — ABNORMAL HIGH (ref 70–99)
Potassium: 3.7 mmol/L (ref 3.5–5.1)
Sodium: 136 mmol/L (ref 135–145)
Total Bilirubin: 0.5 mg/dL (ref 0.3–1.2)
Total Protein: 8.1 g/dL (ref 6.5–8.1)

## 2020-12-30 LAB — BLOOD GAS, ARTERIAL
Acid-Base Excess: 1.3 mmol/L (ref 0.0–2.0)
Bicarbonate: 25 mmol/L (ref 20.0–28.0)
FIO2: 21
O2 Saturation: 98.1 %
Patient temperature: 37
pCO2 arterial: 37.2 mmHg (ref 32.0–48.0)
pH, Arterial: 7.443 (ref 7.350–7.450)
pO2, Arterial: 101 mmHg (ref 83.0–108.0)

## 2020-12-30 LAB — CBC
HCT: 34.7 % — ABNORMAL LOW (ref 39.0–52.0)
Hemoglobin: 11.1 g/dL — ABNORMAL LOW (ref 13.0–17.0)
MCH: 29.9 pg (ref 26.0–34.0)
MCHC: 32 g/dL (ref 30.0–36.0)
MCV: 93.5 fL (ref 80.0–100.0)
Platelets: 344 10*3/uL (ref 150–400)
RBC: 3.71 MIL/uL — ABNORMAL LOW (ref 4.22–5.81)
RDW: 13.9 % (ref 11.5–15.5)
WBC: 14.9 10*3/uL — ABNORMAL HIGH (ref 4.0–10.5)
nRBC: 0 % (ref 0.0–0.2)

## 2020-12-30 LAB — SURGICAL PCR SCREEN
MRSA, PCR: NEGATIVE
Staphylococcus aureus: NEGATIVE

## 2020-12-30 LAB — APTT: aPTT: 33 seconds (ref 24–36)

## 2020-12-30 LAB — SARS CORONAVIRUS 2 (TAT 6-24 HRS): SARS Coronavirus 2: NEGATIVE

## 2020-12-30 LAB — PROTIME-INR
INR: 1.6 — ABNORMAL HIGH (ref 0.8–1.2)
Prothrombin Time: 19 seconds — ABNORMAL HIGH (ref 11.4–15.2)

## 2020-12-30 LAB — GLUCOSE, CAPILLARY: Glucose-Capillary: 65 mg/dL — ABNORMAL LOW (ref 70–99)

## 2020-12-30 NOTE — Progress Notes (Addendum)
PCP: Lorrene Reid, PA-C Cardiologist: Cherlynn Kaiser, MD Pulmonologist: June Leap, MD  EKG: 12/30/20 CXR: 12/30/20 ECHO: 02/26/19 Stress Test: 05/10/19 Cardiac Cath: Denies  Fasting Blood Sugar- 70-110 Checks Blood Sugar__1_ times a day Does not take diabetes medications anymore  OSA/CPAP:  Yes, wears cpap nightly  ASA: No Blood Thinners:  Coumadin last dose 12/26/20  Covid test 12/30/20 pending  Anesthesia Review: Yes, cardiac history, abnormal labs  Patient denies shortness of breath, fever, cough, and chest pain at PAT appointment.  Patient verbalized understanding of instructions provided today at the PAT appointment.  Patient asked to review instructions at home and day of surgery.

## 2020-12-30 NOTE — Progress Notes (Signed)
George Lewis, [place] aware of low BP and ST.

## 2020-12-31 ENCOUNTER — Other Ambulatory Visit: Payer: Self-pay | Admitting: *Deleted

## 2020-12-31 ENCOUNTER — Telehealth: Payer: Self-pay | Admitting: *Deleted

## 2020-12-31 ENCOUNTER — Other Ambulatory Visit: Payer: Self-pay | Admitting: Oncology

## 2020-12-31 NOTE — Anesthesia Preprocedure Evaluation (Deleted)
Anesthesia Evaluation    Airway        Dental   Pulmonary           Cardiovascular hypertension,      Neuro/Psych    GI/Hepatic   Endo/Other  diabetes  Renal/GU      Musculoskeletal   Abdominal   Peds  Hematology   Anesthesia Other Findings   Reproductive/Obstetrics                             Anesthesia Physical Anesthesia Plan  ASA:   Anesthesia Plan:    Post-op Pain Management:    Induction:   PONV Risk Score and Plan:   Airway Management Planned:   Additional Equipment:   Intra-op Plan:   Post-operative Plan:   Informed Consent:   Plan Discussed with:   Anesthesia Plan Comments: (PAT note by Karoline Caldwell, PA-C: Pertinent hx includes right renal cell carcinomas/pnephrectomy 9/19, ESRD on HD(TTS), PE, HTN,HLD,SVT, IDDM2, OSA on CPAP,morbid obesity, and paroxysmal atrial fibrillation maintained on Coumadin.   Last seen by cardiologist Dr. Margaretann Loveless 11/04/2020.  He has had issues with orthostatic hypotension/presyncope for this reason was unable to tolerate rate control medications.  He is maintained on amiodarone 200 mg daily.  He does take midodrine on dialysis days.  He had a right nephrectomy for stage IV renal cell carcinoma in 2019.  On follow-up CT he had 2 lung nodules that slowly increased in size over time.  One was in the lingula and the other was in the right lower lobe.  He underwent wedge resection of the lingular nodule on 10/16/2020 which showed metastatic renal cell carcinoma.  He was subsequently seen by oncologist Dr. Alen Blew who recommended adjuvant immunotherapy.  He was in favor of resecting the right lower lobe nodule prior to initiating treatment.  Patient reported last dose of Coumadin 12/26/2020.  IDDM 2 well-controlled, last A1c 5.3 on 11/30/2020.  Preop labs reviewed, consistent with ESRD, no acute abnormalities noted.  EKG 12/30/2020: Sinus tachycardia.   Rate 118.  No significant change.  CHEST - 2 VIEW 12/30/2020: COMPARISON:  Chest CT December 25, 2020  FINDINGS: No pneumothorax. Persistent left pleural effusion with left lower lung region atelectasis. There may be postoperative hemorrhage in this area as well. Right lung is clear. Heart size and pulmonary vascularity are normal. No adenopathy. No bone lesions.  IMPRESSION: Small left pleural effusion with ill-defined airspace opacity on the left consistent with a combination of atelectasis and potential hemorrhage. Postoperative change noted on the left consistent with recent wedge resection in the lingula.  Right lung is clear.  Note that nodular opacity in the left mid lung seen on recent CT is not appreciable by radiography.  Heart size within normal limits. No adenopathy evident by radiography.  CT chest abdomen pelvis 12/25/2020: IMPRESSION: 1. Interval wedge resection of the lingula and a previously seen hypermetabolic nodule. 2. There is a new, small to moderate left pleural effusion, which is loculated appearing with associated pleural thickening and atelectasis of the dependent left lower lobe. 3. Multiple redemonstrated small bilateral pulmonary nodules, which generally appear to have slightly increased in size compared to prior examination, consistent with enlarging metastases. 4. Unchanged subtle, hyperdense nodule of the medial pancreatic head/neck, measuring 1.3 x 1.2 cm, previously hypermetabolic by PET-CT and in keeping with biopsy-proven pancreatic neuroendocrine tumor. 5. Status post right nephrectomy. No evidence of local recurrence or lymphadenopathy. 6. Coronary artery disease.  Nuclear stress 05/10/2019: The left ventricular ejection fraction is mildly decreased (45-54%). Nuclear stress EF: 52%. There was no ST segment deviation noted during stress. Defect 1: There is a small defect of severe severity present in the apex location. This is a low  risk study.  Abnormal, low risk stress nuclear study with significant apical thinning vs infarct; cannot exclude very mild superimposed apical lateral ischemia; EF low normal (52) with normal wall motion.  TTE 02/26/2019: 1. The left ventricle has normal systolic function with an ejection  fraction of 60-65%. The cavity size was normal. Left ventricular diastolic  Doppler parameters are consistent with impaired relaxation.  2. The right ventricle has normal systolic function. The cavity was  normal. There is no increase in right ventricular wall thickness.  3. Left atrial size was mildly dilated.  4. The aortic valve is tricuspid. Mild thickening of the aortic valve.  Mild calcification of the aortic valve.  )        Anesthesia Quick Evaluation

## 2020-12-31 NOTE — Progress Notes (Signed)
Anesthesia Chart Review:  Pertinent hx includes right renal cell carcinomas/pnephrectomy 9/19, ESRD on HD(TTS), PE, HTN,HLD,SVT, IDDM2, OSA on CPAP,morbid obesity, and paroxysmal atrial fibrillation maintained on Coumadin.   Last seen by cardiologist Dr. Margaretann Loveless 11/04/2020.  He has had issues with orthostatic hypotension/presyncope for this reason was unable to tolerate rate control medications.  He is maintained on amiodarone 200 mg daily.  He does take midodrine on dialysis days.  He had a right nephrectomy for stage IV renal cell carcinoma in 2019.  On follow-up CT he had 2 lung nodules that slowly increased in size over time.  One was in the lingula and the other was in the right lower lobe.  He underwent wedge resection of the lingular nodule on 10/16/2020 which showed metastatic renal cell carcinoma.  He was subsequently seen by oncologist Dr. Alen Blew who recommended adjuvant immunotherapy.  He was in favor of resecting the right lower lobe nodule prior to initiating treatment.  Patient reported last dose of Coumadin 12/26/2020.  IDDM 2 well-controlled, last A1c 5.3 on 11/30/2020.  Preop labs reviewed, consistent with ESRD, no acute abnormalities noted.  EKG 12/30/2020: Sinus tachycardia.  Rate 118.  No significant change.  CHEST - 2 VIEW 12/30/2020: COMPARISON:  Chest CT December 25, 2020  FINDINGS: No pneumothorax. Persistent left pleural effusion with left lower lung region atelectasis. There may be postoperative hemorrhage in this area as well. Right lung is clear. Heart size and pulmonary vascularity are normal. No adenopathy. No bone lesions.  IMPRESSION: Small left pleural effusion with ill-defined airspace opacity on the left consistent with a combination of atelectasis and potential hemorrhage. Postoperative change noted on the left consistent with recent wedge resection in the lingula.  Right lung is clear.  Note that nodular opacity in the left mid lung seen on recent CT  is not appreciable by radiography.  Heart size within normal limits. No adenopathy evident by radiography.  CT chest abdomen pelvis 12/25/2020: IMPRESSION: 1. Interval wedge resection of the lingula and a previously seen hypermetabolic nodule. 2. There is a new, small to moderate left pleural effusion, which is loculated appearing with associated pleural thickening and atelectasis of the dependent left lower lobe. 3. Multiple redemonstrated small bilateral pulmonary nodules, which generally appear to have slightly increased in size compared to prior examination, consistent with enlarging metastases. 4. Unchanged subtle, hyperdense nodule of the medial pancreatic head/neck, measuring 1.3 x 1.2 cm, previously hypermetabolic by PET-CT and in keeping with biopsy-proven pancreatic neuroendocrine tumor. 5. Status post right nephrectomy. No evidence of local recurrence or lymphadenopathy. 6. Coronary artery disease.  Nuclear stress 05/10/2019:  The left ventricular ejection fraction is mildly decreased (45-54%).  Nuclear stress EF: 52%.  There was no ST segment deviation noted during stress.  Defect 1: There is a small defect of severe severity present in the apex location.  This is a low risk study.  Abnormal, low risk stress nuclear study with significant apical thinning vs infarct; cannot exclude very mild superimposed apical lateral ischemia; EF low normal (52) with normal wall motion.  TTE 02/26/2019: 1. The left ventricle has normal systolic function with an ejection  fraction of 60-65%. The cavity size was normal. Left ventricular diastolic  Doppler parameters are consistent with impaired relaxation.  2. The right ventricle has normal systolic function. The cavity was  normal. There is no increase in right ventricular wall thickness.  3. Left atrial size was mildly dilated.  4. The aortic valve is tricuspid. Mild thickening of the  aortic valve.  Mild calcification  of the aortic valve.     Wynonia Musty [facility] Short Stay Center/Anesthesiology Phone [phone] 12/31/2020 10:11 AM

## 2020-12-31 NOTE — Telephone Encounter (Signed)
Returned PC to patient's wife Vaughan Basta, informed her scheduling will be contacting them to set up education class, an appointment with Dr. Alen Blew and infusion.  She verbalizes understanding, instructed her to call office with any further questions/concerns, [phone].

## 2020-12-31 NOTE — Telephone Encounter (Signed)
-----   Message from Wyatt Portela, MD sent at 12/31/2020  4:02 PM EDT ----- Regarding: RE: New appointment I sent a message to schedule him for education class, follow up with me and infusion in the next 2 weeks. Thanks ----- Message ----- From: Rolene Course, RN Sent: 12/31/2020   3:45 PM EDT To: Wyatt Portela, MD Subject: New appointment                                This patient's wife called & said his surgery has been canceled, so she is wanting to know what his tx plan is, does he need to see you?  Please advise, thanks Bethena Roys

## 2021-01-01 ENCOUNTER — Encounter (HOSPITAL_COMMUNITY): Admission: RE | Payer: Self-pay | Source: Home / Self Care

## 2021-01-01 ENCOUNTER — Telehealth: Payer: Self-pay | Admitting: Oncology

## 2021-01-01 ENCOUNTER — Inpatient Hospital Stay (HOSPITAL_COMMUNITY)
Admission: RE | Admit: 2021-01-01 | Payer: Medicare Other | Source: Home / Self Care | Admitting: Thoracic Surgery (Cardiothoracic Vascular Surgery)

## 2021-01-01 SURGERY — WEDGE RESECTION, LUNG, ROBOT-ASSISTED, THORACOSCOPIC
Anesthesia: General | Site: Chest | Laterality: Right

## 2021-01-01 NOTE — Telephone Encounter (Signed)
Scheduled appts per 5/5 sch msg. Called pt, no answer. Left msg with appts date and times.

## 2021-01-06 ENCOUNTER — Inpatient Hospital Stay: Payer: Medicare Other | Attending: Oncology

## 2021-01-06 ENCOUNTER — Other Ambulatory Visit: Payer: Self-pay

## 2021-01-06 DIAGNOSIS — Z5112 Encounter for antineoplastic immunotherapy: Secondary | ICD-10-CM | POA: Insufficient documentation

## 2021-01-06 DIAGNOSIS — J90 Pleural effusion, not elsewhere classified: Secondary | ICD-10-CM | POA: Insufficient documentation

## 2021-01-06 DIAGNOSIS — Z79899 Other long term (current) drug therapy: Secondary | ICD-10-CM | POA: Insufficient documentation

## 2021-01-06 DIAGNOSIS — Z7901 Long term (current) use of anticoagulants: Secondary | ICD-10-CM | POA: Insufficient documentation

## 2021-01-06 DIAGNOSIS — C7801 Secondary malignant neoplasm of right lung: Secondary | ICD-10-CM | POA: Insufficient documentation

## 2021-01-06 DIAGNOSIS — Z905 Acquired absence of kidney: Secondary | ICD-10-CM | POA: Insufficient documentation

## 2021-01-06 DIAGNOSIS — I2510 Atherosclerotic heart disease of native coronary artery without angina pectoris: Secondary | ICD-10-CM | POA: Insufficient documentation

## 2021-01-06 DIAGNOSIS — Z992 Dependence on renal dialysis: Secondary | ICD-10-CM | POA: Insufficient documentation

## 2021-01-06 DIAGNOSIS — C641 Malignant neoplasm of right kidney, except renal pelvis: Secondary | ICD-10-CM | POA: Insufficient documentation

## 2021-01-11 ENCOUNTER — Other Ambulatory Visit: Payer: Self-pay

## 2021-01-11 MED ORDER — WARFARIN SODIUM 3 MG PO TABS: ORAL_TABLET | ORAL | 1 refills | Status: DC

## 2021-01-11 NOTE — Telephone Encounter (Signed)
This is Dr. Delphina Cahill pt

## 2021-01-13 ENCOUNTER — Other Ambulatory Visit: Payer: Self-pay

## 2021-01-13 ENCOUNTER — Telehealth: Payer: Self-pay

## 2021-01-13 ENCOUNTER — Inpatient Hospital Stay: Payer: Medicare Other

## 2021-01-13 ENCOUNTER — Inpatient Hospital Stay (HOSPITAL_BASED_OUTPATIENT_CLINIC_OR_DEPARTMENT_OTHER): Payer: Medicare Other | Admitting: Oncology

## 2021-01-13 ENCOUNTER — Ambulatory Visit: Payer: PRIVATE HEALTH INSURANCE | Admitting: Internal Medicine

## 2021-01-13 VITALS — HR 120

## 2021-01-13 VITALS — BP 119/64 | HR 137 | Temp 97.7°F | Resp 20 | Wt 291.4 lb

## 2021-01-13 DIAGNOSIS — J90 Pleural effusion, not elsewhere classified: Secondary | ICD-10-CM | POA: Diagnosis not present

## 2021-01-13 DIAGNOSIS — Z7901 Long term (current) use of anticoagulants: Secondary | ICD-10-CM | POA: Diagnosis not present

## 2021-01-13 DIAGNOSIS — C7801 Secondary malignant neoplasm of right lung: Secondary | ICD-10-CM | POA: Diagnosis not present

## 2021-01-13 DIAGNOSIS — C641 Malignant neoplasm of right kidney, except renal pelvis: Secondary | ICD-10-CM

## 2021-01-13 DIAGNOSIS — Z79899 Other long term (current) drug therapy: Secondary | ICD-10-CM | POA: Diagnosis not present

## 2021-01-13 DIAGNOSIS — E032 Hypothyroidism due to medicaments and other exogenous substances: Secondary | ICD-10-CM

## 2021-01-13 DIAGNOSIS — Z905 Acquired absence of kidney: Secondary | ICD-10-CM | POA: Diagnosis not present

## 2021-01-13 DIAGNOSIS — I2510 Atherosclerotic heart disease of native coronary artery without angina pectoris: Secondary | ICD-10-CM | POA: Diagnosis not present

## 2021-01-13 DIAGNOSIS — Z5112 Encounter for antineoplastic immunotherapy: Secondary | ICD-10-CM | POA: Diagnosis present

## 2021-01-13 DIAGNOSIS — Z992 Dependence on renal dialysis: Secondary | ICD-10-CM | POA: Diagnosis not present

## 2021-01-13 LAB — CMP (CANCER CENTER ONLY)
ALT: 13 U/L (ref 0–44)
AST: 24 U/L (ref 15–41)
Albumin: 3.5 g/dL (ref 3.5–5.0)
Alkaline Phosphatase: 64 U/L (ref 38–126)
Anion gap: 17 — ABNORMAL HIGH (ref 5–15)
BUN: 38 mg/dL — ABNORMAL HIGH (ref 8–23)
CO2: 28 mmol/L (ref 22–32)
Calcium: 9.4 mg/dL (ref 8.9–10.3)
Chloride: 95 mmol/L — ABNORMAL LOW (ref 98–111)
Creatinine: 5.84 mg/dL (ref 0.61–1.24)
GFR, Estimated: 10 mL/min — ABNORMAL LOW (ref >60)
Glucose, Bld: 159 mg/dL — ABNORMAL HIGH (ref 70–99)
Potassium: 3.2 mmol/L — ABNORMAL LOW (ref 3.5–5.1)
Sodium: 140 mmol/L (ref 135–145)
Total Bilirubin: 0.4 mg/dL (ref 0.3–1.2)
Total Protein: 8.3 g/dL — ABNORMAL HIGH (ref 6.5–8.1)

## 2021-01-13 LAB — CBC WITH DIFFERENTIAL (CANCER CENTER ONLY)
Abs Immature Granulocytes: 0.13 10*3/uL — ABNORMAL HIGH (ref 0.00–0.07)
Basophils Absolute: 0.1 10*3/uL (ref 0.0–0.1)
Basophils Relative: 1 %
Eosinophils Absolute: 0.2 10*3/uL (ref 0.0–0.5)
Eosinophils Relative: 2 %
HCT: 32.9 % — ABNORMAL LOW (ref 39.0–52.0)
Hemoglobin: 11 g/dL — ABNORMAL LOW (ref 13.0–17.0)
Immature Granulocytes: 1 %
Lymphocytes Relative: 12 %
Lymphs Abs: 1.2 10*3/uL (ref 0.7–4.0)
MCH: 29.9 pg (ref 26.0–34.0)
MCHC: 33.4 g/dL (ref 30.0–36.0)
MCV: 89.4 fL (ref 80.0–100.0)
Monocytes Absolute: 0.7 10*3/uL (ref 0.1–1.0)
Monocytes Relative: 7 %
Neutro Abs: 7.5 10*3/uL (ref 1.7–7.7)
Neutrophils Relative %: 77 %
Platelet Count: 259 10*3/uL (ref 150–400)
RBC: 3.68 MIL/uL — ABNORMAL LOW (ref 4.22–5.81)
RDW: 13.7 % (ref 11.5–15.5)
WBC Count: 9.7 10*3/uL (ref 4.0–10.5)
nRBC: 0 % (ref 0.0–0.2)

## 2021-01-13 LAB — TSH: TSH: 2.252 u[IU]/mL (ref 0.320–4.118)

## 2021-01-13 MED ORDER — SODIUM CHLORIDE 0.9 % IV SOLN
Freq: Once | INTRAVENOUS | Status: AC
  Filled 2021-01-13: qty 250

## 2021-01-13 MED ORDER — SODIUM CHLORIDE 0.9 % IV SOLN
400.0000 mg | Freq: Once | INTRAVENOUS | Status: AC
  Administered 2021-01-13: 400 mg via INTRAVENOUS
  Filled 2021-01-13: qty 16

## 2021-01-13 MED ORDER — PROCHLORPERAZINE MALEATE 10 MG PO TABS: 10.0000 mg | ORAL_TABLET | Freq: Four times a day (QID) | ORAL | 0 refills | Status: DC | PRN

## 2021-01-13 NOTE — Patient Instructions (Signed)
[facility]  Discharge Instructions: Thank you for choosing [facility] to provide your oncology and hematology care.   If you have a lab appointment with the [facility], please go directly to the [facility] and check in at the registration area.   Wear comfortable clothing and clothing appropriate for easy access to any Portacath or PICC line.   We strive to give you quality time with your provider. You may need to reschedule your appointment if you arrive late (15 or more minutes).  Arriving late affects you and other patients whose appointments are after yours.  Also, if you miss three or more appointments without notifying the office, you may be dismissed from the clinic at the provider's discretion.      For prescription refill requests, have your pharmacy contact our office and allow 72 hours for refills to be completed.    Today you received the following chemotherapy and/or immunotherapy agents: Keytruda      To help prevent nausea and vomiting after your treatment, we encourage you to take your nausea medication as directed.  BELOW ARE SYMPTOMS THAT SHOULD BE REPORTED IMMEDIATELY: . *FEVER GREATER THAN 100.4 F (38 C) OR HIGHER . *CHILLS OR SWEATING . *NAUSEA AND VOMITING THAT IS NOT CONTROLLED WITH YOUR NAUSEA MEDICATION . *UNUSUAL SHORTNESS OF BREATH . *UNUSUAL BRUISING OR BLEEDING . *URINARY PROBLEMS (pain or burning when urinating, or frequent urination) . *BOWEL PROBLEMS (unusual diarrhea, constipation, pain near the anus) . TENDERNESS IN MOUTH AND THROAT WITH OR WITHOUT PRESENCE OF ULCERS (sore throat, sores in mouth, or a toothache) . UNUSUAL RASH, SWELLING OR PAIN  . UNUSUAL VAGINAL DISCHARGE OR ITCHING   Items with * indicate a potential emergency and should be followed up as soon as possible or go to the Emergency Department if any problems should occur.  Please show the CHEMOTHERAPY ALERT CARD or IMMUNOTHERAPY ALERT  CARD at check-in to the Emergency Department and triage nurse.  Should you have questions after your visit or need to cancel or reschedule your appointment, please contact [facility]  Dept: [phone]  and follow the prompts.  Office hours are 8:00 a.m. to 4:30 p.m. Monday - Friday. Please note that voicemails left after 4:00 p.m. may not be returned until the following business day.  We are closed weekends and major holidays. You have access to a nurse at all times for urgent questions. Please call the main number to the clinic Dept: [phone] and follow the prompts.   For any non-urgent questions, you may also contact your provider using MyChart. We now offer e-Visits for anyone 11 and older to request care online for non-urgent symptoms. For details visit [email or web address].   Also download the MyChart app! Go to the app store, search "MyChart", open the app, select [facility], and log in with your MyChart username and password.  Due to Covid, a mask is required upon entering the hospital/clinic. If you do not have a mask, one will be given to you upon arrival. For doctor visits, patients may have 1 support person aged 71 or older with them. For treatment visits, patients cannot have anyone with them due to current Covid guidelines and our immunocompromised population.   Pembrolizumab injection What is this medicine? PEMBROLIZUMAB (pem broe liz ue mab) is a monoclonal antibody. It is used to treat certain types of cancer. This medicine may be used for other purposes; ask your health care provider or  pharmacist if you have questions. COMMON BRAND NAME(S): Keytruda What should I tell my health care provider before I take this medicine? They need to know if you have any of these conditions:  autoimmune diseases like Crohn's disease, ulcerative colitis, or lupus  have had or planning to have an allogeneic stem cell transplant (uses someone else's stem  cells)  history of organ transplant  history of chest radiation  nervous system problems like myasthenia gravis or Guillain-Barre syndrome  an unusual or allergic reaction to pembrolizumab, other medicines, foods, dyes, or preservatives  pregnant or trying to get pregnant  breast-feeding How should I use this medicine? This medicine is for infusion into a vein. It is given by a health care professional in a hospital or clinic setting. A special MedGuide will be given to you before each treatment. Be sure to read this information carefully each time. Talk to your pediatrician regarding the use of this medicine in children. While this drug may be prescribed for children as young as 6 months for selected conditions, precautions do apply. Overdosage: If you think you have taken too much of this medicine contact a poison control center or emergency room at once. NOTE: This medicine is only for you. Do not share this medicine with others. What if I miss a dose? It is important not to miss your dose. Call your doctor or health care professional if you are unable to keep an appointment. What may interact with this medicine? Interactions have not been studied. This list may not describe all possible interactions. Give your health care provider a list of all the medicines, herbs, non-prescription drugs, or dietary supplements you use. Also tell them if you smoke, drink alcohol, or use illegal drugs. Some items may interact with your medicine. What should I watch for while using this medicine? Your condition will be monitored carefully while you are receiving this medicine. You may need blood work done while you are taking this medicine. Do not become pregnant while taking this medicine or for 4 months after stopping it. Women should inform their doctor if they wish to become pregnant or think they might be pregnant. There is a potential for serious side effects to an unborn child. Talk to your  health care professional or pharmacist for more information. Do not breast-feed an infant while taking this medicine or for 4 months after the last dose. What side effects may I notice from receiving this medicine? Side effects that you should report to your doctor or health care professional as soon as possible:  allergic reactions like skin rash, itching or hives, swelling of the face, lips, or tongue  bloody or black, tarry  breathing problems  changes in vision  chest pain  chills  confusion  constipation  cough  diarrhea  dizziness or feeling faint or lightheaded  fast or irregular heartbeat  fever  flushing  joint pain  low blood counts - this medicine may decrease the number of white blood cells, red blood cells and platelets. You may be at increased risk for infections and bleeding.  muscle pain  muscle weakness  pain, tingling, numbness in the hands or feet  persistent headache  redness, blistering, peeling or loosening of the skin, including inside the mouth  signs and symptoms of high blood sugar such as dizziness; dry mouth; dry skin; fruity breath; nausea; stomach pain; increased hunger or thirst; increased urination  signs and symptoms of kidney injury like trouble passing urine or change in the  amount of urine  signs and symptoms of liver injury like dark urine, light-colored stools, loss of appetite, nausea, right upper belly pain, yellowing of the eyes or skin  sweating  swollen lymph nodes  weight loss Side effects that usually do not require medical attention (report to your doctor or health care professional if they continue or are bothersome):  decreased appetite  hair loss  tiredness This list may not describe all possible side effects. Call your doctor for medical advice about side effects. You may report side effects to FDA at 1-800-FDA-1088. Where should I keep my medicine? This drug is given in a hospital or clinic and will  not be stored at home. NOTE: This sheet is a summary. It may not cover all possible information. If you have questions about this medicine, talk to your doctor, pharmacist, or health care provider.  2021 Elsevier/Gold Standard (2019-07-17 21:44:53)

## 2021-01-13 NOTE — Progress Notes (Signed)
Hematology and Oncology Follow Up Visit  George Lewis [id number] Jan 07, 1956 65 y.o. 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   Prior Therapy:  He underwent laparoscopic radical nephrectomy completed on May 16, 2018 under the care of Dr. Gloriann Loan.  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  Histological grade 4.   He underwent robotic assisted left upper lobe wedge resection completed by Dr. Roxan Hockey on October 16, 2020.  The final pathology showed clear-cell renal cell carcinoma with negative margins.     Current therapy: Pembrolizumab 400 mg every 6 weeks to start Jan 13, 2021.  Interim History: [name] returns today for a follow-up visit.  Since the last visit, he reports no major changes in his health.  He is still recovering from his lung surgery with some residual pain but no shortness of breath.  She continues to be hemodialysis dependent without any recent issues.  He denies recent falls syncope or worsening shortness of breath.     Medications: I have reviewed the patient's current medications.  Current Outpatient Medications  Medication Sig Dispense Refill  . Accu-Chek FastClix Lancets MISC Use to check fasting blood sugar and 2 hrs after largest meal. 100 each 0  . ACCU-CHEK GUIDE test strip USE 1 STRIP TO CHECK FASTING BLOOD SUGAR AND 2 HOURS AFTER LARGEST MEAL 100 each 3  . acetaminophen (TYLENOL) 500 MG tablet Take 1,000 mg by mouth every 8 (eight) hours as needed for mild pain or headache.    . allopurinol (ZYLOPRIM) 100 MG tablet Take 1 tablet (100 mg total) by mouth every evening. 30 tablet 0   . amiodarone (PACERONE) 200 MG tablet Take 1 tablet (200 mg total) by mouth daily. (Patient taking differently: Take 200 mg by mouth every evening.) 90 tablet 3  . atorvastatin (LIPITOR) 40 MG tablet Take 1 tablet (40 mg total) by mouth every evening.    . blood glucose meter kit and supplies Dispense based on patient and insurance preference. Use up to four times daily as directed. (FOR ICD-10 E10.9, E11.9). 1 each 0  . diphenhydramine-acetaminophen (TYLENOL PM) 25-500 MG TABS tablet Take 2 tablets by mouth at bedtime as needed (sleep/headache).    . ferric citrate (AURYXIA) 1 GM 210 MG(Fe) tablet Take 1 tablet (210 mg total) by mouth daily with supper. 270 tablet   . fluticasone (FLONASE) 50 MCG/ACT nasal spray Place 1-2 sprays into both nostrils daily as needed for allergies or rhinitis.    Marland Kitchen gabapentin (NEURONTIN) 100 MG capsule Take 1 capsule (100 mg total) by mouth 2 (two) times daily. Take 1 PO at night for 5 days then go to twice daily (Patient not taking: Reported on 12/28/2020) 60 capsule 2  . insulin glargine (LANTUS) 100 UNIT/ML injection Inject into the skin daily as needed (low blood glucose 180). Unknow dose to give if needed    . loratadine (CLARITIN) 10 MG tablet Take 10 mg by mouth every evening.     . midodrine (PROAMATINE) 10 MG tablet Take 1 tablet (10 mg total) by mouth every Tuesday, Thursday, and Saturday at 6 PM. (Patient taking differently: Take 10 mg by mouth every Tuesday, Thursday, and Saturday at 6 PM. In the evening  10 mg  on Mon. Wed and Friday Take 20 mg on Dialysis days Tues, thurs and Saturday. 10 mg when he gets there and 10 mg half way through)    . nitroGLYCERIN (NITROSTAT) 0.4 MG SL tablet Place 1 tablet (0.4 mg total) under the tongue every 5 (five) minutes as needed for chest pain. 14 tablet 12  . omeprazole (PRILOSEC OTC) 20 MG tablet Take 40 mg by mouth daily before breakfast.     . Sennosides (SENOKOT EXTRA STRENGTH) 17.2 MG TABS Take 34.4 mg by mouth at  bedtime.    . traMADol (ULTRAM) 50 MG tablet Take 50 mg by mouth every 12 (twelve) hours as needed (Pain).    Marland Kitchen warfarin (COUMADIN) 3 MG tablet Take 1 tablet (3 mg total) by mouth every Tuesday, Thursday, and Saturday at 6 PM. (Patient taking differently: Take 3 mg by mouth every Tuesday, Thursday, and Saturday at 6 PM. Take 3 mg tues, thurs Sat., all the other days take 4.5 mg)    . warfarin (COUMADIN) 3 MG tablet Take 1 to 2 tablets daily as directed by the coumadin clinic 45 tablet 1   No current facility-administered medications for this visit.     Allergies:  Allergies  Allergen Reactions  . Penicillins Rash and Other (See Comments)    Has patient had a PCN reaction causing immediate rash, facial/tongue/throat swelling, SOB or lightheadedness with hypotension: yes Has patient had a PCN reaction causing severe rash involving mucus membranes or skin necrosis: no Has patient had a PCN reaction that required hospitalization: no Has patient had a PCN reaction occurring within the last 10 years: no If all of the above answers are "NO", then may proceed with Cephalosporin use.       Physical Exam: Blood pressure 119/64, pulse (!) 137, temperature 97.7 F (36.5 C), temperature source Tympanic, resp. rate 20, weight 291 lb 6 oz (132.2 kg), SpO2 100 %.   ECOG: 1   General appearance: Comfortable appearing without any discomfort Head: Normocephalic without any trauma Oropharynx: Mucous membranes are moist and pink without any thrush or ulcers. Eyes: Pupils are equal and round reactive to light. Lymph nodes: No cervical, supraclavicular, inguinal or axillary lymphadenopathy.   Heart:regular rate and rhythm.  S1 and S2 without leg edema. Lung: Clear without any rhonchi or wheezes.  No dullness to percussion. Abdomin: Soft, nontender, nondistended with good bowel sounds.  No hepatosplenomegaly. Musculoskeletal: No joint deformity or effusion.  Full range of motion noted. Neurological:  No deficits noted on motor, sensory and deep tendon reflex exam. Skin: No petechial rash or dryness.  Appeared moist.     Lab Results: Lab Results  Component Value Date   WBC 14.9 (H) 12/30/2020   HGB 11.1 (L) 12/30/2020   HCT 34.7 (L) 12/30/2020   MCV 93.5 12/30/2020   PLT 344 12/30/2020     Chemistry      Component Value Date/Time   NA 136 12/30/2020 1322   NA 140 11/30/2020 0915   K 3.7 12/30/2020 1322   CL 95 (L) 12/30/2020 1322   CO2 23 12/30/2020 1322   BUN 37 (H) 12/30/2020 1322   BUN 54 (H) 11/30/2020 0915   CREATININE 6.56 (H) 12/30/2020 1322   GLU 96 03/14/2019 0000      Component Value Date/Time   CALCIUM 8.7 (L) 12/30/2020 1322   ALKPHOS 59 12/30/2020 1322   AST 21 12/30/2020 1322   ALT 15 12/30/2020 1322   BILITOT 0.5 12/30/2020 1322  BILITOT 0.3 11/30/2020 0915       Radiological Studies:  IMPRESSION: 1. Interval wedge resection of the lingula and a previously seen hypermetabolic nodule. 2. There is a new, small to moderate left pleural effusion, which is loculated appearing with associated pleural thickening and atelectasis of the dependent left lower lobe. 3. Multiple redemonstrated small bilateral pulmonary nodules, which generally appear to have slightly increased in size compared to prior examination, consistent with enlarging metastases. 4. Unchanged subtle, hyperdense nodule of the medial pancreatic head/neck, measuring 1.3 x 1.2 cm, previously hypermetabolic by PET-CT and in keeping with biopsy-proven pancreatic neuroendocrine tumor. 5. Status post right nephrectomy. No evidence of local recurrence or lymphadenopathy. 6. Coronary artery disease.   Impression and Plan:    1.    Kidney cancer diagnosed in 2019.  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    He is status post left upper lobe lung resection in February 2022.  CT scan obtained  on December 25, 2020 was personally reviewed and discussed with the patient today.  He continues to show evidence of an enlarging small bilateral pulmonary nodules indicating that he is not completely disease free at this time.  Management options moving forward including physical surgical resection which been ruled out at this time based on the bilateral nature of these nodules.  The role for systemic therapy was discussed at this time which include single agent immunotherapy with Pembrolizumab given his low volume disease, combination ipilimumab and nivolumab or combining Pembrolizumab with axitinib or maximal benefit at this time.  After discussion today, we opted to continue with Pembrolizumab alone and consider adding axitinib in the future.  2.  IV access: Peripheral veins currently in use and a Port-A-Cath option is deferred for the time being.  3.  Immune mediated complications: We will continue to monitor thyroid function in addition to the complication clinic pneumonitis, colitis and hypophysitis.  4.  Antiemetics: Low risk of nausea or vomiting but Compazine will be available to him.  5.  Renal failure: He is currently on dialysis without any issues.  6.  Follow-up: In 6 weeks for the next cycle of therapy.  30  minutes were spent on this encounter.  The time was dedicated to reviewing disease status, discussing treatment options and reviewing imaging studies in addition to future plan of care review.    Zola Button, MD 5/18/202212:13 PM

## 2021-01-13 NOTE — Progress Notes (Signed)
Okay to treat with elevated HR per Dr. Alen Blew

## 2021-01-13 NOTE — Telephone Encounter (Signed)
CRITICAL VALUE STICKER  CRITICAL VALUE: Creatinine - 5.84  RECEIVER (on-site recipient of call): Patty Sermons, RN  DATE & TIME NOTIFIED: 01/13/21 @ 1317  MESSENGER (representative from lab): Lelan Pons   MD NOTIFIED: Dr. Alen Blew  TIME OF NOTIFICATION: 01/13/21 @ 1319  RESPONSE: MD aware - patient is undergoing dialysis

## 2021-01-14 ENCOUNTER — Telehealth: Payer: Self-pay | Admitting: *Deleted

## 2021-02-10 NOTE — Progress Notes (Signed)
Cardiology Office Note:    Date:  02/12/2021   ID:  George Lewis., DOB 06-30-1956, MRN [id number]  PCP:  Lorrene Reid, PA-C  Cardiologist:  Elouise Munroe, MD  Electrophysiologist:  None   Referring MD: Lorrene Reid, PA-C   Chief Complaint/Reason for Referral: Afib, hypotension  History of Present Illness:    George Kitt. is a 65 y.o. male with a history of right renal cell carcinoma s/p nephrectomy in 9/19, ESRD on HD (TTS), history of pulmonary embolism, HTN, HLD, SVT, DM type II, OSA on CPAP, morbid obesity, and paroxysmal atrial fibrillation who presents for follow up.  He is on Coumadin for CHA2DS2-VASc score of 3. 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   Today, he is not feeling well. He is accompanied by his wife, who also provides some history and presents a bp log. On non-dialysis days his blood pressure is typically low because he is not currently taking midodrine on these days. Dialysis has not needed to be discontinued due to his blood pressure, and blood pressure on dialysis days has improved since he is taking 3 tablets of 10 mg of midodrine serially throughout his dialysis session.    He feels poorly today and he had been lying down on the exam table, and after sitting up he became lightheaded. Occasionally after a large yawn, he has a strange sensation for a few seconds with arm shaking and feeling his vision altered.  He has been severely fatigued since his pulmonary surgery 09/2020. He is scheduled to return 6/29 for his next infusion of Keytruda.  He has done well with Keytruda and does not have signs or symptoms of heart failure.  We discussed rare instances of pericarditis and myocarditis with immunologic therapy, he has had no symptoms to suggest this.  His fatigue is significantly preventing him from being  active, and this frustrates him.  He still enjoys building bird houses but would like to be more active.  At this time he is wearing compression socks and he believes they are helping.  He denies any shortness of breath, palpitations.No headaches, or syncope to report. Also has mild lower extremity edema, no orthopnea or PND.  Past Medical History:  Diagnosis Date   Anemia    Arthritis    knees   Atrial fibrillation ([place])    Bilateral renal cysts 03/23/2018   Noted MRI ABD   Cancer ([place])    right kidney cancer   Cancer ([place])    pancreatic   Cholelithiasis 03/19/2018   noted on CT AB/Pelvis   CKD (chronic kidney disease), stage IV [facility])    nephrologist-- dr Hollie Salk [name]);  05-01-2018 has not started dialysis, scheduled for AV fistula creation 05-07-2018   Diabetes mellitus without complication ([place])    Diverticulosis of colon 04/19/2018   Noted on CT abd/pelvis   Dysrhythmia    afib   GERD (gastroesophageal reflux disease)    Hepatic steatosis 03/19/2018   Mild diffuse, noted on CT AB/Pelvis   History of kidney stones    History of pulmonary embolus (PE) 03/2008   treated with coumadin for 6 months   History of sepsis 04/20/2018   per discharge note , probable UTI   Hypertension    IBS (irritable bowel syndrome)    Nocturia    OSA on CPAP    uses CPAP nightly  Pancreatic lesion    0.4cm cystic per CT 07/ 2019   Peripheral vascular disease (HCC)    Pneumonia    x 1   Pulmonary nodule    Solid 7m Right Lower Lobe   Right renal mass 04/10/2018   new dx--  scheduled for nephrectomy 05-16-2018   S/P ureteral stent placement: 04/16/2018 04/19/2018    Past Surgical History:  Procedure Laterality Date   AV FISTULA PLACEMENT Left 05/07/2018   Procedure: Creation of Left arm Radiocephalic Fistula;  Surgeon: CMarty Heck MD;  [place]  Service: Vascular;  Laterality: Left;   AV FISTULA PLACEMENT Left 05/15/2019   Procedure: Creation of  Brachiocephalic fistula left arm;  Surgeon: CWaynetta Sandy MD;  [place]  Service: Vascular;  Laterality: Left;   BBronsonLeft 07/15/2019   Procedure: BASILIC VEIN TRANSPOSITION SECOND STAGE;  Surgeon: CWaynetta Sandy MD;  [place]  Service: Vascular;  Laterality: Left;   BRONCHIAL BRUSHINGS  08/25/2020   Procedure: BRONCHIAL BRUSHINGS;  Surgeon: IGarner Nash DO;  [facility];  Service: Pulmonary;;   BRONCHIAL NEEDLE ASPIRATION BIOPSY  08/25/2020   Procedure: BRONCHIAL NEEDLE ASPIRATION BIOPSIES;  Surgeon: IGarner Nash DO;  [facility];  Service: Pulmonary;;   BRONCHIAL WASHINGS  08/25/2020   Procedure: BRONCHIAL WASHINGS;  Surgeon: IGarner Nash DO;  [place] [facility]  Service: Pulmonary;;   COLONOSCOPY     CYSTOSCOPY/RETROGRADE/URETEROSCOPY/STONE EXTRACTION WITH BASKET  x2 last one 1990s approx.   CYSTOSCOPY/URETEROSCOPY/HOLMIUM LASER/STENT PLACEMENT Left 04/16/2018   Procedure: CYSTOSCOPY/LEFT URETEROSCOPY/LEFT RETROGRADE/STENT PLACEMENT;  Surgeon: BLucas Mallow MD;  [facility];  Service: Urology;  Laterality: Left;   EUS N/A 05/03/2018   Procedure: UPPER ENDOSCOPIC ULTRASOUND (EUS) RADIAL;  Surgeon: JMilus Banister MD;  [facility];  Service: Gastroenterology;  Laterality: N/A;   EUS N/A 05/03/2018   Procedure: UPPER ENDOSCOPIC ULTRASOUND (EUS) LINEAR;  Surgeon: JMilus Banister MD;  [facility];  Service: Gastroenterology;  Laterality: N/A;   EUS  10/05/2020   upper GI   EXTRACORPOREAL SHOCK WAVE LITHOTRIPSY  x3  last one 2004 approx.   FINE NEEDLE ASPIRATION N/A 05/03/2018   Procedure: FINE NEEDLE ASPIRATION (FNA) LINEAR;  Surgeon: JMilus Banister MD;  [facility];  Service: Gastroenterology;  Laterality: N/A;   INTERCOSTAL NERVE BLOCK Left 10/16/2020   Procedure: INTERCOSTAL NERVE BLOCK;  Surgeon: HMelrose Nakayama MD;  [place]   Service: Thoracic;  Laterality: Left;   LAPAROSCOPIC NEPHRECTOMY, HAND ASSISTED Right 05/16/2018   Procedure: HAND ASSISTED LAPAROSCOPIC RIGHT NEPHRECTOMY;  Surgeon: BLucas Mallow MD;  [facility];  Service: Urology;  Laterality: Right;   left index finger attachment  1980s   3-4 surgeries   NODE DISSECTION Left 10/16/2020   Procedure: NODE DISSECTION;  Surgeon: HMelrose Nakayama MD;  [place] [facility]  Service: Thoracic;  Laterality: Left;   TOE SURGERY  1980s   in beween 2nd and 3rd toes cyst removed   UPPER GI ENDOSCOPY     VIDEO BRONCHOSCOPY WITH ENDOBRONCHIAL NAVIGATION Bilateral 08/25/2020   Procedure: VIDEO BRONCHOSCOPY WITH ENDOBRONCHIAL NAVIGATION;  Surgeon: IGarner Nash DO;  [place]  Service: Pulmonary;  Laterality: Bilateral;    Current Medications: Current Meds  Medication Sig   Accu-Chek FastClix Lancets MISC Use to check fasting blood sugar and 2 hrs after largest meal.   ACCU-CHEK GUIDE test strip USE 1 STRIP TO CHECK FASTING  BLOOD SUGAR AND 2 HOURS AFTER LARGEST MEAL   acetaminophen (TYLENOL) 500 MG tablet Take 1,000 mg by mouth every 8 (eight) hours as needed for mild pain or headache.   allopurinol (ZYLOPRIM) 100 MG tablet Take 1 tablet (100 mg total) by mouth every evening.   amiodarone (PACERONE) 200 MG tablet Take 1 tablet (200 mg total) by mouth daily. (Patient taking differently: Take 200 mg by mouth every evening.)   atorvastatin (LIPITOR) 40 MG tablet Take 1 tablet (40 mg total) by mouth every evening.   blood glucose meter kit and supplies Dispense based on patient and insurance preference. Use up to four times daily as directed. (FOR ICD-10 E10.9, E11.9).   diphenhydramine-acetaminophen (TYLENOL PM) 25-500 MG TABS tablet Take 2 tablets by mouth at bedtime as needed (sleep/headache).   ferric citrate (AURYXIA) 1 GM 210 MG(Fe) tablet Take 1 tablet (210 mg total) by mouth daily with supper.   fluticasone (FLONASE) 50 MCG/ACT nasal  spray Place 1-2 sprays into both nostrils daily as needed for allergies or rhinitis.   gabapentin (NEURONTIN) 100 MG capsule Take 1 capsule (100 mg total) by mouth 2 (two) times daily. Take 1 PO at night for 5 days then go to twice daily   insulin glargine (LANTUS) 100 UNIT/ML injection Inject into the skin daily as needed (low blood glucose 180). Unknow dose to give if needed   loratadine (CLARITIN) 10 MG tablet Take 10 mg by mouth every evening.    midodrine (PROAMATINE) 10 MG tablet Take 1 tablet (10 mg total) by mouth every Tuesday, Thursday, and Saturday at 6 PM. (Patient taking differently: Take 10 mg by mouth every Tuesday, Thursday, and Saturday at 6 PM. In the evening 10 mg  on Mon. Wed and Friday Take 20 mg on Dialysis days Tues, thurs and Saturday. 10 mg when he gets there and 10 mg half way through)   nitroGLYCERIN (NITROSTAT) 0.4 MG SL tablet Place 1 tablet (0.4 mg total) under the tongue every 5 (five) minutes as needed for chest pain.   omeprazole (PRILOSEC OTC) 20 MG tablet Take 40 mg by mouth daily before breakfast.    prochlorperazine (COMPAZINE) 10 MG tablet Take 1 tablet (10 mg total) by mouth every 6 (six) hours as needed for nausea or vomiting.   Sennosides (SENOKOT EXTRA STRENGTH) 17.2 MG TABS Take 34.4 mg by mouth at bedtime.   traMADol (ULTRAM) 50 MG tablet Take 50 mg by mouth every 12 (twelve) hours as needed (Pain).   warfarin (COUMADIN) 3 MG tablet Take 1 tablet (3 mg total) by mouth every Tuesday, Thursday, and Saturday at 6 PM. (Patient taking differently: Take 3 mg by mouth every Tuesday, Thursday, and Saturday at 6 PM. Take 3 mg tues, thurs Sat., all the other days take 4.5 mg)   warfarin (COUMADIN) 3 MG tablet Take 1 to 2 tablets daily as directed by the coumadin clinic     Allergies:   Penicillins   Social History   Tobacco Use   Smoking status: Never   Smokeless tobacco: Never  Vaping Use   Vaping Use: Never used  Substance Use Topics   Alcohol use: Never    Drug use: Never     Family History: The patient's family history includes Alzheimer's disease in his father and mother; Cancer - Other in his sister; Heart attack in his father; Heart disease in his father.  ROS:   Please see the history of present illness.    (+) Fatigue (+) Tremors,  right UE (+) Lightheadedness All other systems reviewed and are negative.  EKGs/Labs/Other Studies Reviewed:    The following studies were reviewed today:  US Duplex Dialysis Access 06/28/2019: Summary:  Patent arteriovenous fistula.   Lexiscan Myoview 05/10/2019: The left ventricular ejection fraction is mildly decreased (45-54%). Nuclear stress EF: 52%. There was no ST segment deviation noted during stress. Defect 1: There is a small defect of severe severity present in the apex location. This is a low risk study.   Abnormal, low risk stress nuclear study with significant apical thinning vs infarct; cannot exclude very mild superimposed apical lateral ischemia; EF low normal (52) with normal wall motion.  Echo 02/26/2019: 1. The left ventricle has normal systolic function with an ejection  fraction of 60-65%. The cavity size was normal. Left ventricular diastolic  Doppler parameters are consistent with impaired relaxation.   2. The right ventricle has normal systolic function. The cavity was  normal. There is no increase in right ventricular wall thickness.   3. Left atrial size was mildly dilated.   4. The aortic valve is tricuspid. Mild thickening of the aortic valve.  Mild calcification of the aortic valve.  EKG:   02/12/2021: Sinus tachycardia, PVC's, rate 112  11/04/2020: Sinus tachycardia, rate 104 08/31/2020: Sinus tachycardia, rate 114  Recent Labs: 01/13/2021: ALT 13; BUN 38; Creatinine 5.84; Hemoglobin 11.0; Platelet Count 259; Potassium 3.2; Sodium 140; TSH 2.252  Recent Lipid Panel    Component Value Date/Time   CHOL 197 11/30/2020 0915   TRIG 232 (H) 11/30/2020 0915   HDL 34  (L) 11/30/2020 0915   CHOLHDL 5.8 (H) 11/30/2020 0915   LDLCALC 122 (H) 11/30/2020 0915    Physical Exam:    VS:  BP (!) 92/56   Pulse (!) 112   Ht _0  (1.854 m)   Wt 291 lb 12.8 oz (132.4 kg)   SpO2 94%   BMI 38.50 kg/m     Wt Readings from Last 5 Encounters:  02/12/21 291 lb 12.8 oz (132.4 kg)  01/13/21 291 lb 6 oz (132.2 kg)  12/30/20 289 lb 6.4 oz (131.3 kg)  12/14/20 293 lb 6.4 oz (133.1 kg)  12/09/20 293 lb (132.9 kg)    Constitutional: No acute distress Eyes: sclera non-icteric, normal conjunctiva and lids ENMT: normal dentition, moist mucous membranes Cardiovascular: regular rhythm, tachycardic rate, no murmurs. S1 and S2 normal. No jugular venous distention. Respiratory: Decreased breath sounds on left, clear on right. GI : normal bowel sounds, soft and nontender. No distention.   MSK: extremities warm, well perfused. No edema.  NEURO: grossly nonfocal exam, moves all extremities. PSYCH: alert and oriented x 3, normal mood and affect.   ASSESSMENT:    1. Paroxysmal atrial fibrillation (HCC)   2. Long term (current) use of anticoagulants   3. Secondary hypercoagulable state ([place])   4. Near syncope   5. Swelling of both lower extremities   6. Medication management   7. ESRD on hemodialysis [facility])     PLAN:    Paroxysmal atrial fibrillation ([place]) - Plan: EKG 12-Lead Secondary hypercoagulable state ([place]) Long term (current) use of anticoagulants - amiodarone 200 mg daily - continues on warfarin per patient preference for stroke risk reduction. - SR today.   Orthostatic hypotension/Presyncope -  -Encouraged to take 1 tablet of 10 mg midodrine prn once a day, particularly when he feels ill from low blood pressure. -At this time, no urgency to have an echocardiogram, however could consider if symptoms worsen.  HLD - continue atorvastatin 40 mg daily.   Total time of encounter: 30 minutes total time of encounter, including 25 minutes spent in face-to-face  patient care on the date of this encounter. This time includes coordination of care and counseling regarding above mentioned problem list. Remainder of non-face-to-face time involved reviewing chart documents/testing relevant to the patient encounter and documentation in the medical record. I have independently reviewed documentation from referring provider.   Cherlynn Kaiser, MD, [facility]    Medication Adjustments/Labs and Tests Ordered: Current medicines are reviewed at length with the patient today.  Concerns regarding medicines are outlined above.   Orders Placed This Encounter  Procedures   EKG 12-Lead     No orders of the defined types were placed in this encounter.   Patient Instructions  Medication Instructions:    No changes except may take Midodrine  10 mg everyday , continue to follow  Nephrologist  recommendation on dialysis days    *If you need a refill on your cardiac medications before your next appointment, please call your pharmacy*   Lab Work:  Not needed   Testing/Procedures:  Not needed  Follow-Up: At [facility], you and your health needs are our priority.  As part of our continuing mission to provide you with exceptional heart care, we have created designated Provider Care Teams.  These Care Teams include your primary Cardiologist (physician) and Advanced Practice Providers (APPs -  Physician Assistants and Nurse Practitioners) who all work together to provide you with the care you need, when you need it.     Your next appointment:   6 month(s)  The format for your next appointment:   In Person  Provider:   You will see one of the following Advanced Practice Providers on your designated Care Team:   Rosaria Ferries, PA-C Jory Sims, DNP, ANP        I,Mathew Stumpf,acting as a scribe for Elouise Munroe, MD.,have documented all relevant documentation on the behalf of Elouise Munroe, MD,as directed by   Elouise Munroe, MD while in the presence of Elouise Munroe, MD.  I, Elouise Munroe, MD, have reviewed all documentation for this visit. The documentation on 02/12/21 for the exam, diagnosis, procedures, and orders are all accurate and complete.

## 2021-02-12 ENCOUNTER — Other Ambulatory Visit: Payer: Self-pay

## 2021-02-12 ENCOUNTER — Ambulatory Visit (INDEPENDENT_AMBULATORY_CARE_PROVIDER_SITE_OTHER): Payer: Medicare Other

## 2021-02-12 ENCOUNTER — Encounter: Payer: Self-pay | Admitting: Internal Medicine

## 2021-02-12 ENCOUNTER — Ambulatory Visit (INDEPENDENT_AMBULATORY_CARE_PROVIDER_SITE_OTHER): Payer: Medicare Other | Admitting: Internal Medicine

## 2021-02-12 VITALS — BP 92/56 | HR 112 | Ht 73.0 in | Wt 291.8 lb

## 2021-02-12 DIAGNOSIS — I480 Paroxysmal atrial fibrillation: Secondary | ICD-10-CM

## 2021-02-12 DIAGNOSIS — Z7901 Long term (current) use of anticoagulants: Secondary | ICD-10-CM

## 2021-02-12 DIAGNOSIS — M7989 Other specified soft tissue disorders: Secondary | ICD-10-CM

## 2021-02-12 DIAGNOSIS — Z79899 Other long term (current) drug therapy: Secondary | ICD-10-CM

## 2021-02-12 DIAGNOSIS — Z992 Dependence on renal dialysis: Secondary | ICD-10-CM

## 2021-02-12 DIAGNOSIS — D6869 Other thrombophilia: Secondary | ICD-10-CM

## 2021-02-12 DIAGNOSIS — N186 End stage renal disease: Secondary | ICD-10-CM

## 2021-02-12 DIAGNOSIS — R55 Syncope and collapse: Secondary | ICD-10-CM

## 2021-02-12 LAB — POCT INR: INR: 3.3 — AB (ref 2.0–3.0)

## 2021-02-12 NOTE — Patient Instructions (Signed)
Hold tonight only and then Continue taking warfarin 1 tablet (3mg ) daily except 1.5 (4.5mg )  tablet each Monday and Friday.  Repeat INR in 8 weeks.

## 2021-02-12 NOTE — Patient Instructions (Addendum)
Medication Instructions:    No changes except may take Midodrine  10 mg everyday , continue to follow  Nephrologist  recommendation on dialysis days    *If you need a refill on your cardiac medications before your next appointment, please call your pharmacy*   Lab Work:  Not needed   Testing/Procedures:  Not needed  Follow-Up: At [facility], you and your health needs are our priority.  As part of our continuing mission to provide you with exceptional heart care, we have created designated Provider Care Teams.  These Care Teams include your primary Cardiologist (physician) and Advanced Practice Providers (APPs -  Physician Assistants and Nurse Practitioners) who all work together to provide you with the care you need, when you need it.     Your next appointment:   6 month(s)  The format for your next appointment:   In Person  Provider:   You will see one of the following Advanced Practice Providers on your designated Care Team:   Rosaria Ferries, PA-C Jory Sims, DNP, ANP

## 2021-02-23 ENCOUNTER — Ambulatory Visit: Payer: Medicare Other | Admitting: Oncology

## 2021-02-23 ENCOUNTER — Other Ambulatory Visit: Payer: Medicare Other

## 2021-02-23 ENCOUNTER — Ambulatory Visit: Payer: Medicare Other

## 2021-02-24 ENCOUNTER — Inpatient Hospital Stay: Payer: Medicare Other

## 2021-02-24 ENCOUNTER — Inpatient Hospital Stay (HOSPITAL_BASED_OUTPATIENT_CLINIC_OR_DEPARTMENT_OTHER): Payer: Medicare Other | Admitting: Oncology

## 2021-02-24 ENCOUNTER — Other Ambulatory Visit: Payer: Self-pay

## 2021-02-24 ENCOUNTER — Inpatient Hospital Stay: Payer: Medicare Other | Attending: Oncology

## 2021-02-24 VITALS — BP 109/66 | HR 107 | Temp 97.8°F | Resp 18 | Ht 73.0 in | Wt 296.4 lb

## 2021-02-24 DIAGNOSIS — C641 Malignant neoplasm of right kidney, except renal pelvis: Secondary | ICD-10-CM

## 2021-02-24 DIAGNOSIS — C649 Malignant neoplasm of unspecified kidney, except renal pelvis: Secondary | ICD-10-CM | POA: Diagnosis not present

## 2021-02-24 DIAGNOSIS — C7802 Secondary malignant neoplasm of left lung: Secondary | ICD-10-CM | POA: Diagnosis not present

## 2021-02-24 DIAGNOSIS — Z992 Dependence on renal dialysis: Secondary | ICD-10-CM | POA: Insufficient documentation

## 2021-02-24 DIAGNOSIS — Z79899 Other long term (current) drug therapy: Secondary | ICD-10-CM | POA: Diagnosis not present

## 2021-02-24 DIAGNOSIS — Z5112 Encounter for antineoplastic immunotherapy: Secondary | ICD-10-CM | POA: Insufficient documentation

## 2021-02-24 DIAGNOSIS — E032 Hypothyroidism due to medicaments and other exogenous substances: Secondary | ICD-10-CM

## 2021-02-24 DIAGNOSIS — Z7901 Long term (current) use of anticoagulants: Secondary | ICD-10-CM | POA: Insufficient documentation

## 2021-02-24 LAB — CMP (CANCER CENTER ONLY)
ALT: 16 U/L (ref 0–44)
AST: 21 U/L (ref 15–41)
Albumin: 3.6 g/dL (ref 3.5–5.0)
Alkaline Phosphatase: 68 U/L (ref 38–126)
Anion gap: 17 — ABNORMAL HIGH (ref 5–15)
BUN: 34 mg/dL — ABNORMAL HIGH (ref 8–23)
CO2: 27 mmol/L (ref 22–32)
Calcium: 8.6 mg/dL — ABNORMAL LOW (ref 8.9–10.3)
Chloride: 96 mmol/L — ABNORMAL LOW (ref 98–111)
Creatinine: 6.81 mg/dL (ref 0.61–1.24)
GFR, Estimated: 9 mL/min — ABNORMAL LOW (ref >60)
Glucose, Bld: 126 mg/dL — ABNORMAL HIGH (ref 70–99)
Potassium: 4.2 mmol/L (ref 3.5–5.1)
Sodium: 140 mmol/L (ref 135–145)
Total Bilirubin: 0.5 mg/dL (ref 0.3–1.2)
Total Protein: 8 g/dL (ref 6.5–8.1)

## 2021-02-24 LAB — CBC WITH DIFFERENTIAL (CANCER CENTER ONLY)
Abs Immature Granulocytes: 0.07 10*3/uL (ref 0.00–0.07)
Basophils Absolute: 0.1 10*3/uL (ref 0.0–0.1)
Basophils Relative: 1 %
Eosinophils Absolute: 0.2 10*3/uL (ref 0.0–0.5)
Eosinophils Relative: 3 %
HCT: 33.6 % — ABNORMAL LOW (ref 39.0–52.0)
Hemoglobin: 11.1 g/dL — ABNORMAL LOW (ref 13.0–17.0)
Immature Granulocytes: 1 %
Lymphocytes Relative: 19 %
Lymphs Abs: 1.5 10*3/uL (ref 0.7–4.0)
MCH: 29.6 pg (ref 26.0–34.0)
MCHC: 33 g/dL (ref 30.0–36.0)
MCV: 89.6 fL (ref 80.0–100.0)
Monocytes Absolute: 0.7 10*3/uL (ref 0.1–1.0)
Monocytes Relative: 8 %
Neutro Abs: 5.6 10*3/uL (ref 1.7–7.7)
Neutrophils Relative %: 68 %
Platelet Count: 238 10*3/uL (ref 150–400)
RBC: 3.75 MIL/uL — ABNORMAL LOW (ref 4.22–5.81)
RDW: 14.7 % (ref 11.5–15.5)
WBC Count: 8.2 10*3/uL (ref 4.0–10.5)
nRBC: 0 % (ref 0.0–0.2)

## 2021-02-24 LAB — TSH: TSH: 2.3 u[IU]/mL (ref 0.320–4.118)

## 2021-02-24 MED ORDER — SODIUM CHLORIDE 0.9 % IV SOLN
400.0000 mg | Freq: Once | INTRAVENOUS | Status: AC
  Administered 2021-02-24: 400 mg via INTRAVENOUS
  Filled 2021-02-24: qty 16

## 2021-02-24 MED ORDER — SODIUM CHLORIDE 0.9 % IV SOLN
Freq: Once | INTRAVENOUS | Status: AC
Start: 2021-02-24 — End: 2021-02-24
  Filled 2021-02-24: qty 250

## 2021-02-24 NOTE — Progress Notes (Signed)
Per Dr. Alen Blew, ok to treat with elevated HR and Scr.

## 2021-02-24 NOTE — Progress Notes (Signed)
Hematology and Oncology Follow Up Visit  George Lewis [id number] 07/29/1956 65 y.o. 02/24/2021 7:54 AM George Lewis, George Lewis, [place], PA-C   Principle Diagnosis: 65 year old with kidney cancer diagnosed in 2019.  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     Prior Therapy:  He underwent laparoscopic radical nephrectomy completed on May 16, 2018 under the care of Dr. Gloriann Loan.  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  Histological grade 4.   He underwent robotic assisted left upper lobe wedge resection completed by Dr. Roxan Hockey on October 16, 2020.  The final pathology showed clear-cell renal cell carcinoma with negative margins.     Current therapy: Pembrolizumab 400 mg every 6 weeks started in Jan 13, 2021.  He is here for cycle 2 of therapy.  Interim History: [name] presents today for return evaluation.  Since the last visit, he reports no major changes in his health.  He has tolerated Pembrolizumab without any major complaints.  He denies any nausea, vomiting or abdominal pain.  He denies any skin rashes or lesions.  He denies any recent hospitalization or illnesses.  His performance status and quality of life remained reasonable.     Medications: Updated on review. Current Outpatient Medications  Medication Sig Dispense Refill   Accu-Chek FastClix Lancets MISC Use to check fasting blood sugar and 2 hrs after largest meal. 100 each 0   ACCU-CHEK GUIDE test strip USE 1 STRIP TO CHECK FASTING BLOOD SUGAR AND 2 HOURS AFTER LARGEST MEAL 100 each 3   acetaminophen (TYLENOL) 500 MG tablet Take 1,000 mg by mouth every 8 (eight) hours as needed for mild pain or headache.     allopurinol (ZYLOPRIM) 100 MG tablet Take 1 tablet (100 mg total) by mouth every evening. 30 tablet 0    amiodarone (PACERONE) 200 MG tablet Take 1 tablet (200 mg total) by mouth daily. (Patient taking differently: Take 200 mg by mouth every evening.) 90 tablet 3   atorvastatin (LIPITOR) 40 MG tablet Take 1 tablet (40 mg total) by mouth every evening.     blood glucose meter kit and supplies Dispense based on patient and insurance preference. Use up to four times daily as directed. (FOR ICD-10 E10.9, E11.9). 1 each 0   diphenhydramine-acetaminophen (TYLENOL PM) 25-500 MG TABS tablet Take 2 tablets by mouth at bedtime as needed (sleep/headache).     ferric citrate (AURYXIA) 1 GM 210 MG(Fe) tablet Take 1 tablet (210 mg total) by mouth daily with supper. 270 tablet    fluticasone (FLONASE) 50 MCG/ACT nasal spray Place 1-2 sprays into both nostrils daily as needed for allergies or rhinitis.     gabapentin (NEURONTIN) 100 MG capsule Take 1 capsule (100 mg total) by mouth 2 (two) times daily. Take 1 PO at night for 5 days then go to twice daily 60 capsule 2   insulin glargine (LANTUS) 100 UNIT/ML injection Inject into the skin daily as needed (low blood glucose 180). Unknow dose to give if needed     loratadine (CLARITIN) 10 MG tablet Take 10 mg by mouth every evening.      midodrine (PROAMATINE) 10 MG tablet Take 1 tablet (10 mg total) by mouth every Tuesday, Thursday, and Saturday at 6 PM. (Patient taking differently: Take 10 mg by mouth every Tuesday, Thursday, and Saturday at 6 PM. In the evening 10 mg  on Mon. Wed and Friday Take 20 mg on Dialysis days Tues, thurs and Saturday. 10 mg when he gets there and 10 mg half way through)     nitroGLYCERIN (NITROSTAT) 0.4 MG SL tablet Place 1 tablet (0.4 mg total) under the tongue every 5 (five) minutes as needed for chest pain. 14 tablet 12   omeprazole (PRILOSEC OTC) 20 MG tablet Take 40 mg by mouth daily before breakfast.      prochlorperazine (COMPAZINE) 10 MG tablet Take 1 tablet (10 mg total) by mouth every 6 (six) hours as needed for nausea or vomiting. 30  tablet 0   Sennosides (SENOKOT EXTRA STRENGTH) 17.2 MG TABS Take 34.4 mg by mouth at bedtime.     traMADol (ULTRAM) 50 MG tablet Take 50 mg by mouth every 12 (twelve) hours as needed (Pain).     warfarin (COUMADIN) 3 MG tablet Take 1 tablet (3 mg total) by mouth every Tuesday, Thursday, and Saturday at 6 PM. (Patient taking differently: Take 3 mg by mouth every Tuesday, Thursday, and Saturday at 6 PM. Take 3 mg tues, thurs Sat., all the other days take 4.5 mg)     warfarin (COUMADIN) 3 MG tablet Take 1 to 2 tablets daily as directed by the coumadin clinic 45 tablet 1   No current facility-administered medications for this visit.     Allergies:  Allergies  Allergen Reactions   Penicillins Rash and Other (See Comments)    Has patient had a PCN reaction causing immediate rash, facial/tongue/throat swelling, SOB or lightheadedness with hypotension: yes Has patient had a PCN reaction causing severe rash involving mucus membranes or skin necrosis: no Has patient had a PCN reaction that required hospitalization: no Has patient had a PCN reaction occurring within the last 10 years: no If all of the above answers are "NO", then may proceed with Cephalosporin use.       Physical Exam:  Blood pressure 109/66, pulse (!) 107, temperature 97.8 F (36.6 C), temperature source Tympanic, resp. rate 18, height '6\' 1"'  (1.854 m), weight 296 lb 6.4 oz (134.4 kg), SpO2 100 %.   ECOG: 1    General appearance: Alert, awake without any distress. Head: Atraumatic without abnormalities Oropharynx: Without any thrush or ulcers. Eyes: No scleral icterus. Lymph nodes: No lymphadenopathy noted in the cervical, supraclavicular, or axillary nodes Heart:regular rate and rhythm, without any murmurs or gallops.   Lung: Clear to auscultation without any rhonchi, wheezes or dullness to percussion. Abdomin: Soft, nontender without any shifting dullness or ascites. Musculoskeletal: No clubbing or  cyanosis. Neurological: No motor or sensory deficits. Skin: No rashes or lesions.    Lab Results: Lab Results  Component Value Date   WBC 9.7 01/13/2021   HGB 11.0 (L) 01/13/2021   HCT 32.9 (L) 01/13/2021   MCV 89.4 01/13/2021   PLT 259 01/13/2021     Chemistry      Component Value Date/Time   NA 140 01/13/2021 1224   NA 140 11/30/2020 0915   K 3.2 (L) 01/13/2021 1224   CL 95 (L) 01/13/2021 1224   CO2 28 01/13/2021 1224   BUN 38 (H) 01/13/2021 1224   BUN 54 (H) 11/30/2020 0915   CREATININE 5.84 (HH) 01/13/2021 1224   GLU 96 03/14/2019 0000      Component Value Date/Time   CALCIUM 9.4 01/13/2021 1224   ALKPHOS 64 01/13/2021 1224   AST 24 01/13/2021 1224   ALT 13 01/13/2021 1224   BILITOT 0.4 01/13/2021 1224  Impression and Plan:    1.   Stage IV clear-cell renal cell carcinoma with sarcomatoid features documented in February 2022 with pulmonary involvement.       His disease status was updated at this time and treatment choices will reiterated.  He is currently receiving Pembrolizumab for stage IV resected disease without any complaints.  Risks and benefits of continuing his treatment were discussed.  The plan is to update his staging scans and 3 months.  Continuing this therapy versus therapy escalation with the addition of oral TKI such as axitinib will be considered.   2.  IV access: Peripheral veins remains in use without any issues.   3.  Immune mediated complications: He has not experienced any complications.  These include pneumonitis, colitis and thyroid disease.  We will continue to monitor.   4.  Antiemetics: Compazine is available to him.  No nausea or vomiting.  5.  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   6.  Follow-up: He will return in 6 weeks for repeat follow-up.   30  minutes were dedicated to this visit.  The time spent on reviewing laboratory data, disease status update, treatment choices and  outlining future plan of care.     Zola Button, MD 6/29/20227:54 AM

## 2021-02-24 NOTE — Patient Instructions (Signed)
[facility]  Discharge Instructions: Thank you for choosing [facility] to provide your oncology and hematology care.   If you have a lab appointment with the [facility], please go directly to the [facility] and check in at the registration area.   Wear comfortable clothing and clothing appropriate for easy access to any Portacath or PICC line.   We strive to give you quality time with your provider. You may need to reschedule your appointment if you arrive late (15 or more minutes).  Arriving late affects you and other patients whose appointments are after yours.  Also, if you miss three or more appointments without notifying the office, you may be dismissed from the clinic at the provider's discretion.      For prescription refill requests, have your pharmacy contact our office and allow 72 hours for refills to be completed.    Today you received the following chemotherapy and/or immunotherapy agents: Keytruda      To help prevent nausea and vomiting after your treatment, we encourage you to take your nausea medication as directed.  BELOW ARE SYMPTOMS THAT SHOULD BE REPORTED IMMEDIATELY: *FEVER GREATER THAN 100.4 F (38 C) OR HIGHER *CHILLS OR SWEATING *NAUSEA AND VOMITING THAT IS NOT CONTROLLED WITH YOUR NAUSEA MEDICATION *UNUSUAL SHORTNESS OF BREATH *UNUSUAL BRUISING OR BLEEDING *URINARY PROBLEMS (pain or burning when urinating, or frequent urination) *BOWEL PROBLEMS (unusual diarrhea, constipation, pain near the anus) TENDERNESS IN MOUTH AND THROAT WITH OR WITHOUT PRESENCE OF ULCERS (sore throat, sores in mouth, or a toothache) UNUSUAL RASH, SWELLING OR PAIN  UNUSUAL VAGINAL DISCHARGE OR ITCHING   Items with * indicate a potential emergency and should be followed up as soon as possible or go to the Emergency Department if any problems should occur.  Please show the CHEMOTHERAPY ALERT CARD or IMMUNOTHERAPY ALERT CARD at check-in to  the Emergency Department and triage nurse.  Should you have questions after your visit or need to cancel or reschedule your appointment, please contact [facility]  Dept: [phone]  and follow the prompts.  Office hours are 8:00 a.m. to 4:30 p.m. Monday - Friday. Please note that voicemails left after 4:00 p.m. may not be returned until the following business day.  We are closed weekends and major holidays. You have access to a nurse at all times for urgent questions. Please call the main number to the clinic Dept: [phone] and follow the prompts.   For any non-urgent questions, you may also contact your provider using MyChart. We now offer e-Visits for anyone 14 and older to request care online for non-urgent symptoms. For details visit [email or web address].   Also download the MyChart app! Go to the app store, search "MyChart", open the app, select [facility], and log in with your MyChart username and password.  Due to Covid, a mask is required upon entering the hospital/clinic. If you do not have a mask, one will be given to you upon arrival. For doctor visits, patients may have 1 support person aged 75 or older with them. For treatment visits, patients cannot have anyone with them due to current Covid guidelines and our immunocompromised population.   Pembrolizumab injection What is this medicine? PEMBROLIZUMAB (pem broe liz ue mab) is a monoclonal antibody. It is used to treat certain types of cancer. This medicine may be used for other purposes; ask your health care provider or pharmacist if you have questions. COMMON BRAND NAME(S): Keytruda What  should I tell my health care provider before I take this medicine? They need to know if you have any of these conditions: autoimmune diseases like Crohn's disease, ulcerative colitis, or lupus have had or planning to have an allogeneic stem cell transplant (uses someone else's stem cells) history of organ  transplant history of chest radiation nervous system problems like myasthenia gravis or Guillain-Barre syndrome an unusual or allergic reaction to pembrolizumab, other medicines, foods, dyes, or preservatives pregnant or trying to get pregnant breast-feeding How should I use this medicine? This medicine is for infusion into a vein. It is given by a health care professional in a hospital or clinic setting. A special MedGuide will be given to you before each treatment. Be sure to read this information carefully each time. Talk to your pediatrician regarding the use of this medicine in children. While this drug may be prescribed for children as young as 6 months for selected conditions, precautions do apply. Overdosage: If you think you have taken too much of this medicine contact a poison control center or emergency room at once. NOTE: This medicine is only for you. Do not share this medicine with others. What if I miss a dose? It is important not to miss your dose. Call your doctor or health care professional if you are unable to keep an appointment. What may interact with this medicine? Interactions have not been studied. This list may not describe all possible interactions. Give your health care provider a list of all the medicines, herbs, non-prescription drugs, or dietary supplements you use. Also tell them if you smoke, drink alcohol, or use illegal drugs. Some items may interact with your medicine. What should I watch for while using this medicine? Your condition will be monitored carefully while you are receiving this medicine. You may need blood work done while you are taking this medicine. Do not become pregnant while taking this medicine or for 4 months after stopping it. Women should inform their doctor if they wish to become pregnant or think they might be pregnant. There is a potential for serious side effects to an unborn child. Talk to your health care professional or pharmacist for  more information. Do not breast-feed an infant while taking this medicine or for 4 months after the last dose. What side effects may I notice from receiving this medicine? Side effects that you should report to your doctor or health care professional as soon as possible: allergic reactions like skin rash, itching or hives, swelling of the face, lips, or tongue bloody or black, tarry breathing problems changes in vision chest pain chills confusion constipation cough diarrhea dizziness or feeling faint or lightheaded fast or irregular heartbeat fever flushing joint pain low blood counts - this medicine may decrease the number of white blood cells, red blood cells and platelets. You may be at increased risk for infections and bleeding. muscle pain muscle weakness pain, tingling, numbness in the hands or feet persistent headache redness, blistering, peeling or loosening of the skin, including inside the mouth signs and symptoms of high blood sugar such as dizziness; dry mouth; dry skin; fruity breath; nausea; stomach pain; increased hunger or thirst; increased urination signs and symptoms of kidney injury like trouble passing urine or change in the amount of urine signs and symptoms of liver injury like dark urine, light-colored stools, loss of appetite, nausea, right upper belly pain, yellowing of the eyes or skin sweating swollen lymph nodes weight loss Side effects that usually do not require  medical attention (report to your doctor or health care professional if they continue or are bothersome): decreased appetite hair loss tiredness This list may not describe all possible side effects. Call your doctor for medical advice about side effects. You may report side effects to FDA at 1-800-FDA-1088. Where should I keep my medicine? This drug is given in a hospital or clinic and will not be stored at home. NOTE: This sheet is a summary. It may not cover all possible information. If you  have questions about this medicine, talk to your doctor, pharmacist, or health care provider.  2021 [employer] (2019-07-17 21:44:53)

## 2021-03-18 ENCOUNTER — Other Ambulatory Visit: Payer: Medicare Other

## 2021-03-18 ENCOUNTER — Ambulatory Visit: Payer: Medicare Other

## 2021-03-18 ENCOUNTER — Ambulatory Visit: Payer: Medicare Other | Admitting: Oncology

## 2021-03-25 ENCOUNTER — Other Ambulatory Visit: Payer: Self-pay | Admitting: Internal Medicine

## 2021-03-31 NOTE — Telephone Encounter (Signed)
George Lewis is calling in regards to this refill request due to the pharmacy never receiving it and the pt almost being out.

## 2021-03-31 NOTE — Telephone Encounter (Signed)
Prescription refill request received for warfarin Lov: acharys 02/12/21 Next INR check: 8/9 Warfarin tablet strength:3mg  Warfarin schedule:Continue taking warfarin 1 tablet (3mg ) daily except 1.5 (4.5mg )  tablet eachMonday and Friday.

## 2021-04-02 ENCOUNTER — Other Ambulatory Visit: Payer: Self-pay

## 2021-04-02 ENCOUNTER — Encounter: Payer: Self-pay | Admitting: Physician Assistant

## 2021-04-02 ENCOUNTER — Telehealth: Payer: Self-pay | Admitting: Oncology

## 2021-04-02 ENCOUNTER — Ambulatory Visit (INDEPENDENT_AMBULATORY_CARE_PROVIDER_SITE_OTHER): Payer: Medicare Other | Admitting: Physician Assistant

## 2021-04-02 VITALS — BP 85/54 | HR 120 | Temp 98.1°F | Ht 73.0 in | Wt 294.7 lb

## 2021-04-02 DIAGNOSIS — N186 End stage renal disease: Secondary | ICD-10-CM | POA: Diagnosis not present

## 2021-04-02 DIAGNOSIS — I10 Essential (primary) hypertension: Secondary | ICD-10-CM

## 2021-04-02 DIAGNOSIS — E1122 Type 2 diabetes mellitus with diabetic chronic kidney disease: Secondary | ICD-10-CM

## 2021-04-02 DIAGNOSIS — E785 Hyperlipidemia, unspecified: Secondary | ICD-10-CM

## 2021-04-02 DIAGNOSIS — E1169 Type 2 diabetes mellitus with other specified complication: Secondary | ICD-10-CM

## 2021-04-02 DIAGNOSIS — Z794 Long term (current) use of insulin: Secondary | ICD-10-CM

## 2021-04-02 DIAGNOSIS — Z992 Dependence on renal dialysis: Secondary | ICD-10-CM

## 2021-04-02 DIAGNOSIS — I480 Paroxysmal atrial fibrillation: Secondary | ICD-10-CM | POA: Diagnosis not present

## 2021-04-02 DIAGNOSIS — C641 Malignant neoplasm of right kidney, except renal pelvis: Secondary | ICD-10-CM

## 2021-04-02 LAB — POCT GLYCOSYLATED HEMOGLOBIN (HGB A1C): Hemoglobin A1C: 5.5 % (ref 4.0–5.6)

## 2021-04-02 NOTE — Progress Notes (Signed)
Established Patient Office Visit  Subjective:  [name]., male    DOB: [birth year]  Age: 65 y.o. MRN: [id number]  CC:  Chief Complaint  Patient presents with   Follow-up   Diabetes    HPI George Lewis. presents for follow up on diabetes mellitus, hypertension and hyperlipidemia.  Diabetes: Pt denies increased urination or thirst. Pt continues with low carbohydrate/glucose diet. No hypoglycemic events. Checking glucose at home. FBS average 109-110.  HTN: Pt denies chest pain, palpitations, dizziness or increased lower extremity swelling from baseline. Patient has hypotensive episodes post dialysis and takes midodrine. Had dialysis yesterday.  HLD: Pt taking medication as directed without issues. Denies muscle weakness, arthralgias/myalgias.    Past Medical History:  Diagnosis Date   Anemia    Arthritis    knees   Atrial fibrillation ([place])    Bilateral renal cysts 03/23/2018   Noted MRI ABD   Cancer ([place])    right kidney cancer   Cancer ([place])    pancreatic   Cholelithiasis 03/19/2018   noted on CT AB/Pelvis   CKD (chronic kidney disease), stage IV [facility])    nephrologist-- dr Hollie Salk [name]);  05-01-2018 has not started dialysis, scheduled for AV fistula creation 05-07-2018   Diabetes mellitus without complication ([place])    Diverticulosis of colon 04/19/2018   Noted on CT abd/pelvis   Dysrhythmia    afib   GERD (gastroesophageal reflux disease)    Hepatic steatosis 03/19/2018   Mild diffuse, noted on CT AB/Pelvis   History of kidney stones    History of pulmonary embolus (PE) 03/2008   treated with coumadin for 6 months   History of sepsis 04/20/2018   per discharge note , probable UTI   Hypertension    IBS (irritable bowel syndrome)    Nocturia    OSA on CPAP    uses CPAP nightly   Pancreatic lesion    0.4cm cystic per CT 07/ 2019   Peripheral vascular disease (HCC)    Pneumonia    x 1   Pulmonary nodule    Solid 25m  Right Lower Lobe   Right renal mass 04/10/2018   new dx--  scheduled for nephrectomy 05-16-2018   S/P ureteral stent placement: 04/16/2018 04/19/2018    Past Surgical History:  Procedure Laterality Date   AV FISTULA PLACEMENT Left 05/07/2018   Procedure: Creation of Left arm Radiocephalic Fistula;  Surgeon: CMarty Heck MD;  [facility]  Service: Vascular;  Laterality: Left;   AV FISTULA PLACEMENT Left 05/15/2019   Procedure: Creation of Brachiocephalic fistula left arm;  Surgeon: CWaynetta Sandy MD;  [facility]  Service: Vascular;  Laterality: Left;   [place] 07/15/2019   Procedure: BASILIC VEIN TRANSPOSITION SECOND STAGE;  Surgeon: CWaynetta Sandy MD;  [facility]  Service: Vascular;  Laterality: Left;   BRONCHIAL BRUSHINGS  08/25/2020   Procedure: BRONCHIAL BRUSHINGS;  Surgeon: IGarner Nash DO;  [facility];  Service: Pulmonary;;   BRONCHIAL NEEDLE ASPIRATION BIOPSY  08/25/2020   Procedure: BRONCHIAL NEEDLE ASPIRATION BIOPSIES;  Surgeon: IGarner Nash DO;  [facility];  Service: Pulmonary;;   BRONCHIAL WASHINGS  08/25/2020   Procedure: BRONCHIAL WASHINGS;  Surgeon: IGarner Nash DO;  [facility]  Service: Pulmonary;;   COLONOSCOPY     CYSTOSCOPY/RETROGRADE/URETEROSCOPY/STONE EXTRACTION WITH BASKET  x2 last one 1990s approx.   CYSTOSCOPY/URETEROSCOPY/HOLMIUM LASER/STENT PLACEMENT Left 04/16/2018   Procedure: CYSTOSCOPY/LEFT  URETEROSCOPY/LEFT RETROGRADE/STENT PLACEMENT;  Surgeon: Lucas Mallow, MD;  [facility];  Service: Urology;  Laterality: Left;   EUS N/A 05/03/2018   Procedure: UPPER ENDOSCOPIC ULTRASOUND (EUS) RADIAL;  Surgeon: Milus Banister, MD;  [facility];  Service: Gastroenterology;  Laterality: N/A;   EUS N/A 05/03/2018   Procedure: UPPER ENDOSCOPIC ULTRASOUND (EUS) LINEAR;  Surgeon: Milus Banister, MD;  [facility];  Service:  Gastroenterology;  Laterality: N/A;   EUS  10/05/2020   upper GI   EXTRACORPOREAL SHOCK WAVE LITHOTRIPSY  x3  last one 2004 approx.   FINE NEEDLE ASPIRATION N/A 05/03/2018   Procedure: FINE NEEDLE ASPIRATION (FNA) LINEAR;  Surgeon: Milus Banister, MD;  [facility];  Service: Gastroenterology;  Laterality: N/A;   INTERCOSTAL NERVE BLOCK Left 10/16/2020   Procedure: INTERCOSTAL NERVE BLOCK;  Surgeon: Melrose Nakayama, MD;  Location: [facility];  Service: Thoracic;  Laterality: Left;   LAPAROSCOPIC NEPHRECTOMY, HAND ASSISTED Right 05/16/2018   Procedure: HAND ASSISTED LAPAROSCOPIC RIGHT NEPHRECTOMY;  Surgeon: Lucas Mallow, MD;  [facility];  Service: Urology;  Laterality: Right;   left index finger attachment  1980s   3-4 surgeries   NODE DISSECTION Left 10/16/2020   Procedure: NODE DISSECTION;  Surgeon: Melrose Nakayama, MD;  Location: [facility];  Service: Thoracic;  Laterality: Left;   TOE SURGERY  1980s   in beween 2nd and 3rd toes cyst removed   UPPER GI ENDOSCOPY     VIDEO BRONCHOSCOPY WITH ENDOBRONCHIAL NAVIGATION Bilateral 08/25/2020   Procedure: VIDEO BRONCHOSCOPY WITH ENDOBRONCHIAL NAVIGATION;  Surgeon: Garner Nash, DO;  Location: [facility];  Service: Pulmonary;  Laterality: Bilateral;    Family History  Problem Relation Age of Onset   Alzheimer's disease Mother    Alzheimer's disease Father    Heart disease Father    Heart attack Father    Cancer - Other Sister     Social History   Socioeconomic History   Marital status: Married    Spouse name: Not on file   Number of children: Not on file   Years of education: Not on file   Highest education level: Not on file  Occupational History   Occupation: disabled  Tobacco Use   Smoking status: Never   Smokeless tobacco: Never  Vaping Use   Vaping Use: Never used  Substance and Sexual Activity   Alcohol use: Never   Drug use: Never   Sexual activity: Not on file  Other Topics Concern   Not  on file  Social History Narrative   Not on file   Social Determinants of Health   Financial Resource Strain: Not on file  Food Insecurity: Not on file  Transportation Needs: Not on file  Physical Activity: Not on file  Stress: Not on file  Social Connections: Not on file  Intimate Partner Violence: Not on file    Outpatient Medications Prior to Visit  Medication Sig Dispense Refill   Accu-Chek FastClix Lancets MISC Use to check fasting blood sugar and 2 hrs after largest meal. 100 each 0   ACCU-CHEK GUIDE test strip USE 1 STRIP TO CHECK FASTING BLOOD SUGAR AND 2 HOURS AFTER LARGEST MEAL 100 each 3   acetaminophen (TYLENOL) 500 MG tablet Take 1,000 mg by mouth every 8 (eight) hours as needed for mild pain or headache.     allopurinol (ZYLOPRIM) 100 MG tablet Take 1 tablet (100 mg total) by mouth every evening. 30 tablet 0  amiodarone (PACERONE) 200 MG tablet Take 1 tablet (200 mg total) by mouth daily. (Patient taking differently: Take 200 mg by mouth every evening.) 90 tablet 3   atorvastatin (LIPITOR) 40 MG tablet Take 1 tablet (40 mg total) by mouth every evening.     blood glucose meter kit and supplies Dispense based on patient and insurance preference. Use up to four times daily as directed. (FOR ICD-10 E10.9, E11.9). 1 each 0   diphenhydramine-acetaminophen (TYLENOL PM) 25-500 MG TABS tablet Take 2 tablets by mouth at bedtime as needed (sleep/headache).     ferric citrate (AURYXIA) 1 GM 210 MG(Fe) tablet Take 1 tablet (210 mg total) by mouth daily with supper. 270 tablet    fluticasone (FLONASE) 50 MCG/ACT nasal spray Place 1-2 sprays into both nostrils daily as needed for allergies or rhinitis.     gabapentin (NEURONTIN) 100 MG capsule Take 1 capsule (100 mg total) by mouth 2 (two) times daily. Take 1 PO at night for 5 days then go to twice daily 60 capsule 2   insulin glargine (LANTUS) 100 UNIT/ML injection Inject into the skin daily as needed (low blood glucose 180). Unknow dose  to give if needed     loratadine (CLARITIN) 10 MG tablet Take 10 mg by mouth every evening.      midodrine (PROAMATINE) 10 MG tablet Take 1 tablet (10 mg total) by mouth every Tuesday, Thursday, and Saturday at 6 PM. (Patient taking differently: Take 10 mg by mouth every Tuesday, Thursday, and Saturday at 6 PM. In the evening 10 mg  on Mon. Wed and Friday Take 20 mg on Dialysis days Tues, thurs and Saturday. 10 mg when he gets there and 10 mg half way through)     nitroGLYCERIN (NITROSTAT) 0.4 MG SL tablet Place 1 tablet (0.4 mg total) under the tongue every 5 (five) minutes as needed for chest pain. 14 tablet 12   omeprazole (PRILOSEC OTC) 20 MG tablet Take 40 mg by mouth daily before breakfast.      prochlorperazine (COMPAZINE) 10 MG tablet Take 1 tablet (10 mg total) by mouth every 6 (six) hours as needed for nausea or vomiting. 30 tablet 0   Sennosides (SENOKOT EXTRA STRENGTH) 17.2 MG TABS Take 34.4 mg by mouth at bedtime.     traMADol (ULTRAM) 50 MG tablet Take 50 mg by mouth every 12 (twelve) hours as needed (Pain).     warfarin (COUMADIN) 3 MG tablet Take 1 tablet (3 mg total) by mouth every Tuesday, Thursday, and Saturday at 6 PM. (Patient taking differently: Take 3 mg by mouth every Tuesday, Thursday, and Saturday at 6 PM. Take 3 mg tues, thurs Sat., all the other days take 4.5 mg)     warfarin (COUMADIN) 3 MG tablet TAKE 1 TO 2 TABLETS BY MOUTH DAILY AS DIRECTED BY  THE  COUMADIN  CLINIC 45 tablet 0   No facility-administered medications prior to visit.    Allergies  Allergen Reactions   Penicillins Rash and Other (See Comments)    Has patient had a PCN reaction causing immediate rash, facial/tongue/throat swelling, SOB or lightheadedness with hypotension: yes Has patient had a PCN reaction causing severe rash involving mucus membranes or skin necrosis: no Has patient had a PCN reaction that required hospitalization: no Has patient had a PCN reaction occurring within the last 10 years:  no If all of the above answers are "NO", then may proceed with Cephalosporin use.     ROS Review of Systems A  fourteen system review of systems was performed and found to be positive as per HPI.    Objective:    Physical Exam General:  Pleasant and cooperative, in no acute distress  Neuro:  Alert and oriented,  extra-ocular muscles intact  HEENT:  Normocephalic, atraumatic, neck supple Skin:  no gross rash, warm, pink. Cardiac:  Regular rhythm, tachycardia Respiratory:  CTA B/L, Not using accessory muscles, speaking in full sentences- unlabored. Vascular:  Ext warm, no cyanosis apprec.; cap RF less 2 sec. Psych:  No HI/SI, judgement and insight good, Euthymic mood. Full Affect.  BP (!) 85/54   Pulse (!) 120   Temp 98.1 F (36.7 C)   Ht _0  (1.854 m)   Wt 294 lb 11.2 oz (133.7 kg)   SpO2 99%   BMI 38.88 kg/m  Wt Readings from Last 3 Encounters:  04/02/21 294 lb 11.2 oz (133.7 kg)  02/24/21 296 lb 6.4 oz (134.4 kg)  02/12/21 291 lb 12.8 oz (132.4 kg)     Health Maintenance Due  Topic Date Due   PNEUMOCOCCAL POLYSACCHARIDE VACCINE AGE 33-64 HIGH RISK  Never done   Hepatitis C Screening  Never done   TETANUS/TDAP  Never done   Zoster Vaccines- Shingrix (1 of 2) Never done   COLONOSCOPY (Pts 45-44yr Insurance coverage will need to be confirmed)  Never done   Pneumococcal Vaccine 075631Years old (2 - PPSV23 or PCV20) 04/17/2020   COVID-19 Vaccine (4 - Booster for Moderna series) 10/09/2020   INFLUENZA VACCINE  03/29/2021    There are no preventive care reminders to display for this patient.  Lab Results  Component Value Date   TSH 2.300 02/24/2021   Lab Results  Component Value Date   WBC 9.3 04/02/2021   HGB 12.2 (L) 04/02/2021   HCT 37.3 (L) 04/02/2021   MCV 90 04/02/2021   PLT 244 04/02/2021   Lab Results  Component Value Date   NA 139 04/02/2021   K 4.2 04/02/2021   CO2 25 04/02/2021   GLUCOSE 130 (H) 04/02/2021   BUN 29 (H) 04/02/2021    CREATININE 6.34 (H) 04/02/2021   BILITOT 0.4 04/02/2021   ALKPHOS 87 04/02/2021   AST 27 04/02/2021   ALT 24 04/02/2021   PROT 7.6 04/02/2021   ALBUMIN 4.7 04/02/2021   CALCIUM 9.0 04/02/2021   ANIONGAP 17 (H) 02/24/2021   EGFR 9 (L) 04/02/2021   Lab Results  Component Value Date   CHOL 229 (H) 04/02/2021   Lab Results  Component Value Date   HDL 31 (L) 04/02/2021   Lab Results  Component Value Date   LDLCALC 135 (H) 04/02/2021   Lab Results  Component Value Date   TRIG 347 (H) 04/02/2021   Lab Results  Component Value Date   CHOLHDL 7.4 (H) 04/02/2021   Lab Results  Component Value Date   HGBA1C 5.5 04/02/2021      Assessment & Plan:   Problem List Items Addressed This Visit       Cardiovascular and Mediastinum   HTN (hypertension)   Relevant Orders   Comp Met (CMET) (Completed)   Paroxysmal atrial fibrillation ([facility]     Endocrine   Type 2 diabetes mellitus with chronic kidney disease on chronic dialysis, with long-term current use of insulin (HCC) - Primary   Relevant Orders   POCT glycosylated hemoglobin (Hb A1C) (Completed)   Comp Met (CMET) (Completed)   Lipid Profile (Completed)   CBC w/Diff (Completed)  Genitourinary   Renal cell carcinoma, right ([place])   Other Visit Diagnoses     Hyperlipidemia associated with type 2 diabetes mellitus ([place])       Relevant Orders   Lipid Profile (Completed)       Type 2 diabetes mellitus with chronic kidney disease on chronic dialysis, without long-term current use of insulin: -A1c today 5.5, well controlled with diet. -Continue ambulatory glucose monitoring and monitor for hypoglycemic events. -Will continue to monitor.  Primary HTN: -Stable. -Soft blood pressure. Recommend to continue midodrine and monitor fluid intake.  -Will continue to monitor.  Hyperlipidemia associated with type 2 diabetes mellitus: -Will collect fasting lipid panel and hepatic function. -Continue atorvastatin 40  mg. -Will continue to monitor.  Renal cell carcinoma: -Followed by Oncology. -On Pembrolizumab,  PAF: -Followed by Cardiology. -On warfarin and amiodarone.   No orders of the defined types were placed in this encounter.   Follow-up: Return in about 4 months (around 08/02/2021) for DM, HTN, HLD.    Lorrene Reid, PA-C

## 2021-04-02 NOTE — Patient Instructions (Signed)
Hypotension As your heart beats, it forces blood through your body. This force is called blood pressure. If you have hypotension, you have low blood pressure. When your blood pressure is too low, you may not get enough blood to your brain or other parts of your body. This may cause you to feel weak, light-headed, have a fast heartbeat, or even pass out (faint). Low blood pressure may be harmless, or it may cause serious problems. What are the causes? Blood loss. Not enough water in the body (dehydration). Heart problems. Hormone problems. Pregnancy. A very bad infection. Not having enough of certain nutrients. Very bad allergic reactions. Certain medicines. What increases the risk? Age. The risk increases as you get older. Conditions that affect the heart or the brain and spinal cord (central nervous system). Taking certain medicines. Being pregnant. What are the signs or symptoms? Feeling: Weak. Light-headed. Dizzy. Tired (fatigued). Blurred vision. Fast heartbeat. Passing out, in very bad cases. How is this treated? Changing your diet. This may involve eating more salt (sodium) or drinking more water. Taking medicines to raise your blood pressure. Changing how much you take (the dosage) of some of your medicines. Wearing compression stockings. These stockings help to prevent blood clots and reduce swelling in your legs. In some cases, you may need to go to the hospital for: Fluid replacement. This means you will receive fluids through an IV tube. Blood replacement. This means you will receive donated blood through an IV tube (transfusion). Treating an infection or heart problems, if this applies. Monitoring. You may need to be monitored while medicines that you are taking wear off. Follow these instructions at home: Eating and drinking  Drink enough fluids to keep your pee (urine) pale yellow. Eat a healthy diet. Follow instructions from your doctor about what you can eat or  drink. A healthy diet includes: Fresh fruits and vegetables. Whole grains. Low-fat (lean) meats. Low-fat dairy products. Eat extra salt only as told. Do not add extra salt to your diet unless your doctor tells you to. Eat small meals often. Avoid standing up quickly after you eat.  Medicines Take over-the-counter and prescription medicines only as told by your doctor. Follow instructions from your doctor about changing how much you take of your medicines, if this applies. Do not stop or change any of your medicines on your own. General instructions  Wear compression stockings as told by your doctor. Get up slowly from lying down or sitting. Avoid hot showers and a lot of heat as told by your doctor. Return to your normal activities as told by your doctor. Ask what activities are safe for you. Do not use any products that contain nicotine or tobacco, such as cigarettes, e-cigarettes, and chewing tobacco. If you need help quitting, ask your doctor. Keep all follow-up visits as told by your doctor. This is important.  Contact a doctor if: You throw up (vomit). You have watery poop (diarrhea). You have a fever for more than 2-3 days. You feel more thirsty than normal. You feel weak and tired. Get help right away if: You have chest pain. You have a fast or uneven heartbeat. You lose feeling (have numbness) in any part of your body. You cannot move your arms or your legs. You have trouble talking. You get sweaty or feel light-headed. You pass out. You have trouble breathing. You have trouble staying awake. You feel mixed up (confused). Summary Hypotension is also called low blood pressure. It is when the force of  blood pumping through your arteries is too weak. Hypotension may be harmless, or it may cause serious problems. Treatment may include changing your diet and medicines, and wearing compression stockings. In very bad cases, you may need to go to the hospital. This  information is not intended to replace advice given to you by your health care provider. Make sure you discuss any questions you have with your healthcare provider. Document Revised: 02/08/2018 Document Reviewed: 02/08/2018 Elsevier Patient Education  [place].

## 2021-04-02 NOTE — Telephone Encounter (Signed)
Called patient regarding upcoming appointments, left a voicemail. 

## 2021-04-03 ENCOUNTER — Encounter: Payer: Self-pay | Admitting: Oncology

## 2021-04-03 LAB — COMPREHENSIVE METABOLIC PANEL
ALT: 24 IU/L (ref 0–44)
AST: 27 IU/L (ref 0–40)
Albumin/Globulin Ratio: 1.6 (ref 1.2–2.2)
Albumin: 4.7 g/dL (ref 3.8–4.8)
Alkaline Phosphatase: 87 IU/L (ref 44–121)
BUN/Creatinine Ratio: 5 — ABNORMAL LOW (ref 10–24)
BUN: 29 mg/dL — ABNORMAL HIGH (ref 8–27)
Bilirubin Total: 0.4 mg/dL (ref 0.0–1.2)
CO2: 25 mmol/L (ref 20–29)
Calcium: 9 mg/dL (ref 8.6–10.2)
Chloride: 93 mmol/L — ABNORMAL LOW (ref 96–106)
Creatinine, Ser: 6.34 mg/dL — ABNORMAL HIGH (ref 0.76–1.27)
Globulin, Total: 2.9 g/dL (ref 1.5–4.5)
Glucose: 130 mg/dL — ABNORMAL HIGH (ref 65–99)
Potassium: 4.2 mmol/L (ref 3.5–5.2)
Sodium: 139 mmol/L (ref 134–144)
Total Protein: 7.6 g/dL (ref 6.0–8.5)
eGFR: 9 mL/min/{1.73_m2} — ABNORMAL LOW (ref >59)

## 2021-04-03 LAB — CBC WITH DIFFERENTIAL/PLATELET
Basophils Absolute: 0.1 10*3/uL (ref 0.0–0.2)
Basos: 1 %
EOS (ABSOLUTE): 0.4 10*3/uL (ref 0.0–0.4)
Eos: 4 %
Hematocrit: 37.3 % — ABNORMAL LOW (ref 37.5–51.0)
Hemoglobin: 12.2 g/dL — ABNORMAL LOW (ref 13.0–17.7)
Immature Grans (Abs): 0.1 10*3/uL (ref 0.0–0.1)
Immature Granulocytes: 1 %
Lymphocytes Absolute: 1.6 10*3/uL (ref 0.7–3.1)
Lymphs: 17 %
MCH: 29.5 pg (ref 26.6–33.0)
MCHC: 32.7 g/dL (ref 31.5–35.7)
MCV: 90 fL (ref 79–97)
Monocytes Absolute: 0.7 10*3/uL (ref 0.1–0.9)
Monocytes: 7 %
Neutrophils Absolute: 6.5 10*3/uL (ref 1.4–7.0)
Neutrophils: 70 %
Platelets: 244 10*3/uL (ref 150–450)
RBC: 4.13 x10E6/uL — ABNORMAL LOW (ref 4.14–5.80)
RDW: 15.1 % (ref 11.6–15.4)
WBC: 9.3 10*3/uL (ref 3.4–10.8)

## 2021-04-03 LAB — LIPID PANEL
Chol/HDL Ratio: 7.4 ratio — ABNORMAL HIGH (ref 0.0–5.0)
Cholesterol, Total: 229 mg/dL — ABNORMAL HIGH (ref 100–199)
HDL: 31 mg/dL — ABNORMAL LOW (ref >39)
LDL Chol Calc (NIH): 135 mg/dL — ABNORMAL HIGH (ref 0–99)
Triglycerides: 347 mg/dL — ABNORMAL HIGH (ref 0–149)
VLDL Cholesterol Cal: 63 mg/dL — ABNORMAL HIGH (ref 5–40)

## 2021-04-06 ENCOUNTER — Other Ambulatory Visit: Payer: Self-pay

## 2021-04-06 ENCOUNTER — Ambulatory Visit (INDEPENDENT_AMBULATORY_CARE_PROVIDER_SITE_OTHER): Payer: Medicare Other

## 2021-04-06 DIAGNOSIS — I480 Paroxysmal atrial fibrillation: Secondary | ICD-10-CM

## 2021-04-06 DIAGNOSIS — Z7901 Long term (current) use of anticoagulants: Secondary | ICD-10-CM | POA: Diagnosis not present

## 2021-04-06 LAB — POCT INR: INR: 2.6 (ref 2.0–3.0)

## 2021-04-06 NOTE — Patient Instructions (Signed)
Continue taking warfarin 1 tablet (3mg ) daily except 1.5 (4.5mg )  tablet each Monday and Friday.  Repeat INR in 8 weeks.

## 2021-04-07 ENCOUNTER — Inpatient Hospital Stay: Payer: Medicare Other | Attending: Oncology

## 2021-04-07 ENCOUNTER — Inpatient Hospital Stay: Payer: Medicare Other

## 2021-04-07 ENCOUNTER — Inpatient Hospital Stay (HOSPITAL_BASED_OUTPATIENT_CLINIC_OR_DEPARTMENT_OTHER): Payer: Medicare Other | Admitting: Oncology

## 2021-04-07 ENCOUNTER — Telehealth: Payer: Self-pay

## 2021-04-07 VITALS — BP 99/66 | HR 105 | Temp 97.1°F | Resp 18 | Wt 292.6 lb

## 2021-04-07 DIAGNOSIS — N189 Chronic kidney disease, unspecified: Secondary | ICD-10-CM | POA: Insufficient documentation

## 2021-04-07 DIAGNOSIS — C641 Malignant neoplasm of right kidney, except renal pelvis: Secondary | ICD-10-CM

## 2021-04-07 DIAGNOSIS — C649 Malignant neoplasm of unspecified kidney, except renal pelvis: Secondary | ICD-10-CM | POA: Diagnosis not present

## 2021-04-07 DIAGNOSIS — Z7901 Long term (current) use of anticoagulants: Secondary | ICD-10-CM | POA: Insufficient documentation

## 2021-04-07 DIAGNOSIS — C7800 Secondary malignant neoplasm of unspecified lung: Secondary | ICD-10-CM | POA: Insufficient documentation

## 2021-04-07 DIAGNOSIS — Z5112 Encounter for antineoplastic immunotherapy: Secondary | ICD-10-CM | POA: Insufficient documentation

## 2021-04-07 DIAGNOSIS — E032 Hypothyroidism due to medicaments and other exogenous substances: Secondary | ICD-10-CM

## 2021-04-07 DIAGNOSIS — Z992 Dependence on renal dialysis: Secondary | ICD-10-CM | POA: Insufficient documentation

## 2021-04-07 DIAGNOSIS — Z79899 Other long term (current) drug therapy: Secondary | ICD-10-CM | POA: Diagnosis not present

## 2021-04-07 DIAGNOSIS — R21 Rash and other nonspecific skin eruption: Secondary | ICD-10-CM | POA: Diagnosis not present

## 2021-04-07 LAB — CBC WITH DIFFERENTIAL (CANCER CENTER ONLY)
Abs Immature Granulocytes: 0.16 10*3/uL — ABNORMAL HIGH (ref 0.00–0.07)
Basophils Absolute: 0.1 10*3/uL (ref 0.0–0.1)
Basophils Relative: 1 %
Eosinophils Absolute: 0.2 10*3/uL (ref 0.0–0.5)
Eosinophils Relative: 2 %
HCT: 36.2 % — ABNORMAL LOW (ref 39.0–52.0)
Hemoglobin: 12.4 g/dL — ABNORMAL LOW (ref 13.0–17.0)
Immature Granulocytes: 2 %
Lymphocytes Relative: 18 %
Lymphs Abs: 1.8 10*3/uL (ref 0.7–4.0)
MCH: 30.8 pg (ref 26.0–34.0)
MCHC: 34.3 g/dL (ref 30.0–36.0)
MCV: 90 fL (ref 80.0–100.0)
Monocytes Absolute: 1 10*3/uL (ref 0.1–1.0)
Monocytes Relative: 9 %
Neutro Abs: 7 10*3/uL (ref 1.7–7.7)
Neutrophils Relative %: 68 %
Platelet Count: 266 10*3/uL (ref 150–400)
RBC: 4.02 MIL/uL — ABNORMAL LOW (ref 4.22–5.81)
RDW: 14.3 % (ref 11.5–15.5)
WBC Count: 10.3 10*3/uL (ref 4.0–10.5)
nRBC: 0 % (ref 0.0–0.2)

## 2021-04-07 LAB — CMP (CANCER CENTER ONLY)
ALT: 23 U/L (ref 0–44)
AST: 23 U/L (ref 15–41)
Albumin: 4 g/dL (ref 3.5–5.0)
Alkaline Phosphatase: 89 U/L (ref 38–126)
Anion gap: 20 — ABNORMAL HIGH (ref 5–15)
BUN: 46 mg/dL — ABNORMAL HIGH (ref 8–23)
CO2: 25 mmol/L (ref 22–32)
Calcium: 9.5 mg/dL (ref 8.9–10.3)
Chloride: 94 mmol/L — ABNORMAL LOW (ref 98–111)
Creatinine: 8.46 mg/dL (ref 0.61–1.24)
GFR, Estimated: 7 mL/min — ABNORMAL LOW (ref >60)
Glucose, Bld: 120 mg/dL — ABNORMAL HIGH (ref 70–99)
Potassium: 4.2 mmol/L (ref 3.5–5.1)
Sodium: 139 mmol/L (ref 135–145)
Total Bilirubin: 0.5 mg/dL (ref 0.3–1.2)
Total Protein: 8.3 g/dL — ABNORMAL HIGH (ref 6.5–8.1)

## 2021-04-07 LAB — TSH: TSH: 1.966 u[IU]/mL (ref 0.320–4.118)

## 2021-04-07 MED ORDER — SODIUM CHLORIDE 0.9 % IV SOLN
400.0000 mg | Freq: Once | INTRAVENOUS | Status: AC
  Administered 2021-04-07: 400 mg via INTRAVENOUS
  Filled 2021-04-07: qty 16

## 2021-04-07 MED ORDER — SODIUM CHLORIDE 0.9 % IV SOLN
Freq: Once | INTRAVENOUS | Status: AC
Start: 2021-04-07 — End: 2021-04-07
  Filled 2021-04-07: qty 250

## 2021-04-07 NOTE — Progress Notes (Signed)
Per Dr. Alen Blew, ok to treat with elevated creatinine and HR 105.

## 2021-04-07 NOTE — Telephone Encounter (Signed)
CRITICAL VALUE STICKER  CRITICAL VALUE: Creatinine = 8.46  RECEIVER (on-site recipient of call): Yetta Glassman, CMA  DATE & TIME NOTIFIED: 04/07/21 at 9:35am  MESSENGER (representative from lab): Suanne Marker  MD NOTIFIED: Dr. Alen Blew  TIME OF NOTIFICATION: 04/07/21 at 9:40am  RESPONSE: Notification given to Carlyon Prows, RN for follow-up with the provider.

## 2021-04-07 NOTE — Patient Instructions (Signed)
[facility]   Discharge Instructions: Thank you for choosing [facility] to provide your oncology and hematology care.   If you have a lab appointment with the [facility], please go directly to the [facility] and check in at the registration area.   Wear comfortable clothing and clothing appropriate for easy access to any Portacath or PICC line.   We strive to give you quality time with your provider. You may need to reschedule your appointment if you arrive late (15 or more minutes).  Arriving late affects you and other patients whose appointments are after yours.  Also, if you miss three or more appointments without notifying the office, you may be dismissed from the clinic at the provider's discretion.      For prescription refill requests, have your pharmacy contact our office and allow 72 hours for refills to be completed.    Today you received the following chemotherapy and/or immunotherapy agents: pembrolizumab.      To help prevent nausea and vomiting after your treatment, we encourage you to take your nausea medication as directed.  BELOW ARE SYMPTOMS THAT SHOULD BE REPORTED IMMEDIATELY: *FEVER GREATER THAN 100.4 F (38 C) OR HIGHER *CHILLS OR SWEATING *NAUSEA AND VOMITING THAT IS NOT CONTROLLED WITH YOUR NAUSEA MEDICATION *UNUSUAL SHORTNESS OF BREATH *UNUSUAL BRUISING OR BLEEDING *URINARY PROBLEMS (pain or burning when urinating, or frequent urination) *BOWEL PROBLEMS (unusual diarrhea, constipation, pain near the anus) TENDERNESS IN MOUTH AND THROAT WITH OR WITHOUT PRESENCE OF ULCERS (sore throat, sores in mouth, or a toothache) UNUSUAL RASH, SWELLING OR PAIN  UNUSUAL VAGINAL DISCHARGE OR ITCHING   Items with * indicate a potential emergency and should be followed up as soon as possible or go to the Emergency Department if any problems should occur.  Please show the CHEMOTHERAPY ALERT CARD or IMMUNOTHERAPY ALERT CARD at  check-in to the Emergency Department and triage nurse.  Should you have questions after your visit or need to cancel or reschedule your appointment, please contact [facility]  Dept: [phone]  and follow the prompts.  Office hours are 8:00 a.m. to 4:30 p.m. Monday - Friday. Please note that voicemails left after 4:00 p.m. may not be returned until the following business day.  We are closed weekends and major holidays. You have access to a nurse at all times for urgent questions. Please call the main number to the clinic Dept: [phone] and follow the prompts.   For any non-urgent questions, you may also contact your provider using MyChart. We now offer e-Visits for anyone 24 and older to request care online for non-urgent symptoms. For details visit [email or web address].   Also download the MyChart app! Go to the app store, search "MyChart", open the app, select [facility], and log in with your MyChart username and password.  Due to Covid, a mask is required upon entering the hospital/clinic. If you do not have a mask, one will be given to you upon arrival. For doctor visits, patients may have 1 support person aged 2 or older with them. For treatment visits, patients cannot have anyone with them due to current Covid guidelines and our immunocompromised population.

## 2021-04-07 NOTE — Progress Notes (Signed)
Hematology and Oncology Follow Up Visit  George Lewis [id number] 1956-02-12 65 y.o. 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     Prior Therapy:  He underwent laparoscopic radical nephrectomy completed on May 16, 2018 under the care of Dr. Gloriann Loan.  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  Histological grade 4.   He underwent robotic assisted left upper lobe wedge resection completed by Dr. Roxan Hockey on October 16, 2020.  The final pathology showed clear-cell renal cell carcinoma with negative margins.     Current therapy: Pembrolizumab 400 mg every 6 weeks started in Jan 13, 2021.  He is here for cycle 3 of therapy.  Interim History: [name] returns today for a follow-up visit.  Since her last visit, he reports no major changes in his health.  He has tolerated Pembrolizumab without any major complaints.  He does report mild rash and pruritus which is manageable with Benadryl cream.  His performance status and quality of life remained excellent.  He has tolerated hemodialysis without any issues with the addition of Pembrolizumab.     Medications: Unchanged on review. Current Outpatient Medications  Medication Sig Dispense Refill   Accu-Chek FastClix Lancets MISC Use to check fasting blood sugar and 2 hrs after largest meal. 100 each 0   ACCU-CHEK GUIDE test strip USE 1 STRIP TO CHECK FASTING BLOOD SUGAR AND 2 HOURS AFTER LARGEST MEAL 100 each 3   acetaminophen (TYLENOL) 500 MG tablet Take 1,000 mg by mouth every 8 (eight) hours as needed for mild pain or headache.     allopurinol (ZYLOPRIM) 100 MG tablet Take 1 tablet (100 mg total) by mouth every evening. 30 tablet 0   amiodarone (PACERONE) 200 MG  tablet Take 1 tablet (200 mg total) by mouth daily. (Patient taking differently: Take 200 mg by mouth every evening.) 90 tablet 3   atorvastatin (LIPITOR) 40 MG tablet Take 1 tablet (40 mg total) by mouth every evening.     blood glucose meter kit and supplies Dispense based on patient and insurance preference. Use up to four times daily as directed. (FOR ICD-10 E10.9, E11.9). 1 each 0   diphenhydramine-acetaminophen (TYLENOL PM) 25-500 MG TABS tablet Take 2 tablets by mouth at bedtime as needed (sleep/headache).     ferric citrate (AURYXIA) 1 GM 210 MG(Fe) tablet Take 1 tablet (210 mg total) by mouth daily with supper. 270 tablet    fluticasone (FLONASE) 50 MCG/ACT nasal spray Place 1-2 sprays into both nostrils daily as needed for allergies or rhinitis.     gabapentin (NEURONTIN) 100 MG capsule Take 1 capsule (100 mg total) by mouth 2 (two) times daily. Take 1 PO at night for 5 days then go to twice daily 60 capsule 2   insulin glargine (LANTUS) 100 UNIT/ML injection Inject into the skin daily as needed (low blood glucose 180). Unknow dose to give if needed     loratadine (CLARITIN) 10 MG tablet Take 10 mg by mouth every evening.      midodrine (PROAMATINE) 10 MG tablet Take 1 tablet (10 mg total) by mouth every Tuesday, Thursday, and Saturday at 6 PM. (Patient taking differently: Take 10 mg by mouth every Tuesday, Thursday, and Saturday at 6 PM. In the evening 10 mg  on Mon. Wed and  Friday Take 20 mg on Dialysis days Tues, thurs and Saturday. 10 mg when he gets there and 10 mg half way through)     nitroGLYCERIN (NITROSTAT) 0.4 MG SL tablet Place 1 tablet (0.4 mg total) under the tongue every 5 (five) minutes as needed for chest pain. 14 tablet 12   omeprazole (PRILOSEC OTC) 20 MG tablet Take 40 mg by mouth daily before breakfast.      prochlorperazine (COMPAZINE) 10 MG tablet Take 1 tablet (10 mg total) by mouth every 6 (six) hours as needed for nausea or vomiting. 30 tablet 0   Sennosides (SENOKOT  EXTRA STRENGTH) 17.2 MG TABS Take 34.4 mg by mouth at bedtime.     traMADol (ULTRAM) 50 MG tablet Take 50 mg by mouth every 12 (twelve) hours as needed (Pain).     warfarin (COUMADIN) 3 MG tablet Take 1 tablet (3 mg total) by mouth every Tuesday, Thursday, and Saturday at 6 PM. (Patient taking differently: Take 3 mg by mouth every Tuesday, Thursday, and Saturday at 6 PM. Take 3 mg tues, thurs Sat., all the other days take 4.5 mg)     warfarin (COUMADIN) 3 MG tablet TAKE 1 TO 2 TABLETS BY MOUTH DAILY AS DIRECTED BY  THE  COUMADIN  CLINIC 45 tablet 0   No current facility-administered medications for this visit.     Allergies:  Allergies  Allergen Reactions   Penicillins Rash and Other (See Comments)    Has patient had a PCN reaction causing immediate rash, facial/tongue/throat swelling, SOB or lightheadedness with hypotension: yes Has patient had a PCN reaction causing severe rash involving mucus membranes or skin necrosis: no Has patient had a PCN reaction that required hospitalization: no Has patient had a PCN reaction occurring within the last 10 years: no If all of the above answers are "NO", then may proceed with Cephalosporin use.       Physical Exam:  Blood pressure 99/66, pulse (!) 105, temperature (!) 97.1 F (36.2 C), temperature source Tympanic, resp. rate 18, weight 292 lb 9 oz (132.7 kg), SpO2 100 %.    ECOG: 1   General appearance: Comfortable appearing without any discomfort Head: Normocephalic without any trauma Oropharynx: Mucous membranes are moist and pink without any thrush or ulcers. Eyes: Pupils are equal and round reactive to light. Lymph nodes: No cervical, supraclavicular, inguinal or axillary lymphadenopathy.   Heart:regular rate and rhythm.  S1 and S2 without leg edema. Lung: Clear without any rhonchi or wheezes.  No dullness to percussion. Abdomin: Soft, nontender, nondistended with good bowel sounds.  No hepatosplenomegaly. Musculoskeletal: No  joint deformity or effusion.  Full range of motion noted. Neurological: No deficits noted on motor, sensory and deep tendon reflex exam. Skin: No petechial rash or dryness.  Appeared moist.      Lab Results: Lab Results  Component Value Date   WBC 9.3 04/02/2021   HGB 12.2 (L) 04/02/2021   HCT 37.3 (L) 04/02/2021   MCV 90 04/02/2021   PLT 244 04/02/2021     Chemistry      Component Value Date/Time   NA 139 04/02/2021 0908   K 4.2 04/02/2021 0908   CL 93 (L) 04/02/2021 0908   CO2 25 04/02/2021 0908   BUN 29 (H) 04/02/2021 0908   CREATININE 6.34 (H) 04/02/2021 0908   CREATININE 6.81 (HH) 02/24/2021 0759   GLU 96 03/14/2019 0000      Component Value Date/Time   CALCIUM 9.0 04/02/2021 0908   ALKPHOS  87 04/02/2021 0908   AST 27 04/02/2021 0908   AST 21 02/24/2021 0759   ALT 24 04/02/2021 0908   ALT 16 02/24/2021 0759   BILITOT 0.4 04/02/2021 0908   BILITOT 0.5 02/24/2021 0759          Impression and Plan:    1.   Kidney cancer diagnosed in February 2022.  He was found to have stage IV clear-cell  with sarcomatoid features and pulmonary involvement.   The natural course of his disease was reviewed at this time and treatment choices were discussed.  He is receiving Pembrolizumab for stage IV resected disease at this time.  Risks and benefits of continuing this treatment long-term were discussed.  The plan is to update his imaging studies in September 2022.  Laboratory data from today reviewed and showed appropriate hematological parameters.  He is agreeable to proceed.    2.  IV access: No issues reported with peripheral veins at this time.   3.  Immune mediated complications: I continue to educate him about potential complication including pneumonitis, colitis and thyroid disease.   4.  Antiemetics: No nausea or vomiting reported at this time.  Compazine is available to him.  5.  Renal failure: He continues to receive dialysis without any complications.   6.   Follow-up: In 6 weeks for repeat follow-up.   30  minutes were spent on this encounter.  Time was dedicated to reviewing laboratory data, disease status update, treatment choices and outlining future plan of care.     Zola Button, MD 8/10/20228:57 AM

## 2021-04-08 ENCOUNTER — Other Ambulatory Visit: Payer: Self-pay | Admitting: Physician Assistant

## 2021-04-08 MED ORDER — ALLOPURINOL 100 MG PO TABS: 100.0000 mg | ORAL_TABLET | Freq: Every evening | ORAL | 0 refills | Status: AC

## 2021-04-08 NOTE — Telephone Encounter (Signed)
Patients wife called stating patient needs refill of Allopurinol. This medication has not been prescribed for patient by [place] before. Please approve if refill deemed appropriate. AS, CMA

## 2021-04-08 NOTE — Telephone Encounter (Signed)
Per Herb Grays ok to send in 30 day supply. Future refills will need to come from nephrology. AS, CMA

## 2021-04-13 ENCOUNTER — Telehealth: Payer: Self-pay | Admitting: Oncology

## 2021-04-13 NOTE — Telephone Encounter (Signed)
Scheduled per 08/10 los, patient has been called. Spoke with patient's spouse, patient will be notified.

## 2021-05-07 ENCOUNTER — Telehealth: Payer: Self-pay | Admitting: Internal Medicine

## 2021-05-07 NOTE — Telephone Encounter (Signed)
*  STAT* If patient is at the pharmacy, call can be transferred to refill team.   1. Which medications need to be refilled? (please list name of each medication and dose if known)  warfarin (COUMADIN) 3 MG tablet  2. Which pharmacy/location (including street and city if local pharmacy) is medication to be sent to? [place], [place]  3. Do they need a 30 day or 90 day supply?  90 day supply

## 2021-05-10 MED ORDER — WARFARIN SODIUM 3 MG PO TABS: ORAL_TABLET | ORAL | 0 refills | Status: DC

## 2021-05-10 NOTE — Telephone Encounter (Signed)
Refill sent.

## 2021-05-12 ENCOUNTER — Other Ambulatory Visit: Payer: Self-pay

## 2021-05-12 ENCOUNTER — Other Ambulatory Visit: Payer: Self-pay | Admitting: Oncology

## 2021-05-12 ENCOUNTER — Encounter (HOSPITAL_COMMUNITY): Payer: Self-pay

## 2021-05-12 ENCOUNTER — Ambulatory Visit (HOSPITAL_COMMUNITY)
Admission: RE | Admit: 2021-05-12 | Discharge: 2021-05-12 | Disposition: A | Discharge status: Home / Self Care | Payer: Medicare Other | Source: Ambulatory Visit | Attending: Oncology | Admitting: Oncology

## 2021-05-12 DIAGNOSIS — C641 Malignant neoplasm of right kidney, except renal pelvis: Secondary | ICD-10-CM

## 2021-05-12 MED ORDER — IOHEXOL 350 MG/ML SOLN
100.0000 mL | Freq: Once | INTRAVENOUS | Status: AC | PRN
  Administered 2021-05-12: 100 mL via INTRAVENOUS

## 2021-05-19 ENCOUNTER — Telehealth: Payer: Self-pay | Admitting: Oncology

## 2021-05-19 ENCOUNTER — Other Ambulatory Visit: Payer: Self-pay

## 2021-05-19 ENCOUNTER — Inpatient Hospital Stay (HOSPITAL_BASED_OUTPATIENT_CLINIC_OR_DEPARTMENT_OTHER): Payer: Medicare Other | Admitting: Oncology

## 2021-05-19 ENCOUNTER — Inpatient Hospital Stay: Payer: Medicare Other

## 2021-05-19 ENCOUNTER — Telehealth: Payer: Self-pay

## 2021-05-19 ENCOUNTER — Inpatient Hospital Stay: Payer: Medicare Other | Attending: Oncology

## 2021-05-19 VITALS — HR 104

## 2021-05-19 VITALS — BP 93/59 | HR 117 | Temp 97.8°F | Resp 18 | Ht 73.0 in | Wt 292.0 lb

## 2021-05-19 DIAGNOSIS — C7802 Secondary malignant neoplasm of left lung: Secondary | ICD-10-CM | POA: Insufficient documentation

## 2021-05-19 DIAGNOSIS — C641 Malignant neoplasm of right kidney, except renal pelvis: Secondary | ICD-10-CM | POA: Diagnosis not present

## 2021-05-19 DIAGNOSIS — I959 Hypotension, unspecified: Secondary | ICD-10-CM | POA: Insufficient documentation

## 2021-05-19 DIAGNOSIS — C7801 Secondary malignant neoplasm of right lung: Secondary | ICD-10-CM | POA: Diagnosis not present

## 2021-05-19 DIAGNOSIS — Z7901 Long term (current) use of anticoagulants: Secondary | ICD-10-CM | POA: Diagnosis not present

## 2021-05-19 DIAGNOSIS — R000 Tachycardia, unspecified: Secondary | ICD-10-CM | POA: Insufficient documentation

## 2021-05-19 DIAGNOSIS — Z5112 Encounter for antineoplastic immunotherapy: Secondary | ICD-10-CM | POA: Insufficient documentation

## 2021-05-19 DIAGNOSIS — E032 Hypothyroidism due to medicaments and other exogenous substances: Secondary | ICD-10-CM

## 2021-05-19 DIAGNOSIS — I4891 Unspecified atrial fibrillation: Secondary | ICD-10-CM | POA: Insufficient documentation

## 2021-05-19 LAB — CBC WITH DIFFERENTIAL (CANCER CENTER ONLY)
Abs Immature Granulocytes: 0.12 10*3/uL — ABNORMAL HIGH (ref 0.00–0.07)
Basophils Absolute: 0.1 10*3/uL (ref 0.0–0.1)
Basophils Relative: 1 %
Eosinophils Absolute: 0.2 10*3/uL (ref 0.0–0.5)
Eosinophils Relative: 2 %
HCT: 34.5 % — ABNORMAL LOW (ref 39.0–52.0)
Hemoglobin: 11.6 g/dL — ABNORMAL LOW (ref 13.0–17.0)
Immature Granulocytes: 1 %
Lymphocytes Relative: 19 %
Lymphs Abs: 1.7 10*3/uL (ref 0.7–4.0)
MCH: 30.7 pg (ref 26.0–34.0)
MCHC: 33.6 g/dL (ref 30.0–36.0)
MCV: 91.3 fL (ref 80.0–100.0)
Monocytes Absolute: 0.7 10*3/uL (ref 0.1–1.0)
Monocytes Relative: 8 %
Neutro Abs: 6.4 10*3/uL (ref 1.7–7.7)
Neutrophils Relative %: 69 %
Platelet Count: 279 10*3/uL (ref 150–400)
RBC: 3.78 MIL/uL — ABNORMAL LOW (ref 4.22–5.81)
RDW: 14.2 % (ref 11.5–15.5)
WBC Count: 9.2 10*3/uL (ref 4.0–10.5)
nRBC: 0 % (ref 0.0–0.2)

## 2021-05-19 LAB — CMP (CANCER CENTER ONLY)
ALT: 14 U/L (ref 0–44)
AST: 19 U/L (ref 15–41)
Albumin: 4 g/dL (ref 3.5–5.0)
Alkaline Phosphatase: 71 U/L (ref 38–126)
Anion gap: 17 — ABNORMAL HIGH (ref 5–15)
BUN: 35 mg/dL — ABNORMAL HIGH (ref 8–23)
CO2: 24 mmol/L (ref 22–32)
Calcium: 9.3 mg/dL (ref 8.9–10.3)
Chloride: 100 mmol/L (ref 98–111)
Creatinine: 7.54 mg/dL (ref 0.61–1.24)
GFR, Estimated: 8 mL/min — ABNORMAL LOW (ref >60)
Glucose, Bld: 122 mg/dL — ABNORMAL HIGH (ref 70–99)
Potassium: 4 mmol/L (ref 3.5–5.1)
Sodium: 141 mmol/L (ref 135–145)
Total Bilirubin: 0.5 mg/dL (ref 0.3–1.2)
Total Protein: 8.2 g/dL — ABNORMAL HIGH (ref 6.5–8.1)

## 2021-05-19 LAB — TSH: TSH: 2.368 u[IU]/mL (ref 0.320–4.118)

## 2021-05-19 MED ORDER — SODIUM CHLORIDE 0.9 % IV SOLN
400.0000 mg | Freq: Once | INTRAVENOUS | Status: AC
  Administered 2021-05-19: 400 mg via INTRAVENOUS
  Filled 2021-05-19: qty 16

## 2021-05-19 MED ORDER — SODIUM CHLORIDE 0.9 % IV SOLN: Freq: Once | INTRAVENOUS | Status: AC

## 2021-05-19 NOTE — Patient Instructions (Signed)
[facility]   Discharge Instructions: Thank you for choosing [facility] to provide your oncology and hematology care.   If you have a lab appointment with the [facility], please go directly to the [facility] and check in at the registration area.   Wear comfortable clothing and clothing appropriate for easy access to any Portacath or PICC line.   We strive to give you quality time with your provider. You may need to reschedule your appointment if you arrive late (15 or more minutes).  Arriving late affects you and other patients whose appointments are after yours.  Also, if you miss three or more appointments without notifying the office, you may be dismissed from the clinic at the provider's discretion.      For prescription refill requests, have your pharmacy contact our office and allow 72 hours for refills to be completed.    Today you received the following chemotherapy and/or immunotherapy agents: Pembrolizumab (Keytruda).      To help prevent nausea and vomiting after your treatment, we encourage you to take your nausea medication as directed.  BELOW ARE SYMPTOMS THAT SHOULD BE REPORTED IMMEDIATELY: *FEVER GREATER THAN 100.4 F (38 C) OR HIGHER *CHILLS OR SWEATING *NAUSEA AND VOMITING THAT IS NOT CONTROLLED WITH YOUR NAUSEA MEDICATION *UNUSUAL SHORTNESS OF BREATH *UNUSUAL BRUISING OR BLEEDING *URINARY PROBLEMS (pain or burning when urinating, or frequent urination) *BOWEL PROBLEMS (unusual diarrhea, constipation, pain near the anus) TENDERNESS IN MOUTH AND THROAT WITH OR WITHOUT PRESENCE OF ULCERS (sore throat, sores in mouth, or a toothache) UNUSUAL RASH, SWELLING OR PAIN  UNUSUAL VAGINAL DISCHARGE OR ITCHING   Items with * indicate a potential emergency and should be followed up as soon as possible or go to the Emergency Department if any problems should occur.  Please show the CHEMOTHERAPY ALERT CARD or IMMUNOTHERAPY ALERT  CARD at check-in to the Emergency Department and triage nurse.  Should you have questions after your visit or need to cancel or reschedule your appointment, please contact [facility]  Dept: [phone]  and follow the prompts.  Office hours are 8:00 a.m. to 4:30 p.m. Monday - Friday. Please note that voicemails left after 4:00 p.m. may not be returned until the following business day.  We are closed weekends and major holidays. You have access to a nurse at all times for urgent questions. Please call the main number to the clinic Dept: [phone] and follow the prompts.   For any non-urgent questions, you may also contact your provider using MyChart. We now offer e-Visits for anyone 33 and older to request care online for non-urgent symptoms. For details visit [email or web address].   Also download the MyChart app! Go to the app store, search "MyChart", open the app, select [facility], and log in with your MyChart username and password.  Due to Covid, a mask is required upon entering the hospital/clinic. If you do not have a mask, one will be given to you upon arrival. For doctor visits, patients may have 1 support person aged 72 or older with them. For treatment visits, patients cannot have anyone with them due to current Covid guidelines and our immunocompromised population.

## 2021-05-19 NOTE — Telephone Encounter (Signed)
Scheduled per 09/21 los, spoke with patient's spouse. Patient will be notified.

## 2021-05-19 NOTE — Progress Notes (Signed)
Hematology and Oncology Follow Up Visit  George Lewis [id number] Nov 02, 1955 65 y.o. 05/19/2021 9:16 AM George Lewis, George Dell, PA-C   Principle Diagnosis: 65 year old man with kidney cancer diagnosed in 2019.  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     Prior Therapy:  He underwent laparoscopic radical nephrectomy completed on May 16, 2018 under the care of Dr. Gloriann Loan.  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  Histological grade 4.   He underwent robotic assisted left upper lobe wedge resection completed by Dr. Roxan Hockey on October 16, 2020.  The final pathology showed clear-cell renal cell carcinoma with negative margins.     Current therapy: Pembrolizumab 400 mg every 6 weeks started in Jan 13, 2021.  He is here for cycle 4 of therapy.  Interim History: [name] presents today for repeat evaluation.  Since the last visit, he reports no major changes in his health.  He has developed atrial fibrillation with heart rate slightly elevated but asymptomatic.  He denies any fevers or chills or sweats.  He denies any excessive fatigue or tiredness.  He denies any complications related to Pembrolizumab.  He denies any diarrhea or pulmonary complaints.     Medications: Unchanged on review. Current Outpatient Medications  Medication Sig Dispense Refill   Accu-Chek FastClix Lancets MISC Use to check fasting blood sugar and 2 hrs after largest meal. 100 each 0   ACCU-CHEK GUIDE test strip USE 1 STRIP TO CHECK FASTING BLOOD SUGAR AND 2 HOURS AFTER LARGEST MEAL 100 each 3   acetaminophen (TYLENOL) 500 MG tablet Take 1,000 mg by mouth every 8 (eight) hours as needed for mild pain or headache.     allopurinol (ZYLOPRIM) 100 MG tablet Take 1 tablet (100 mg total) by mouth every evening. 30  tablet 0   amiodarone (PACERONE) 200 MG tablet Take 1 tablet (200 mg total) by mouth daily. (Patient taking differently: Take 200 mg by mouth every evening.) 90 tablet 3   atorvastatin (LIPITOR) 40 MG tablet Take 1 tablet (40 mg total) by mouth every evening.     blood glucose meter kit and supplies Dispense based on patient and insurance preference. Use up to four times daily as directed. (FOR ICD-10 E10.9, E11.9). 1 each 0   diphenhydramine-acetaminophen (TYLENOL PM) 25-500 MG TABS tablet Take 2 tablets by mouth at bedtime as needed (sleep/headache).     ferric citrate (AURYXIA) 1 GM 210 MG(Fe) tablet Take 1 tablet (210 mg total) by mouth daily with supper. 270 tablet    fluticasone (FLONASE) 50 MCG/ACT nasal spray Place 1-2 sprays into both nostrils daily as needed for allergies or rhinitis.     gabapentin (NEURONTIN) 100 MG capsule Take 1 capsule (100 mg total) by mouth 2 (two) times daily. Take 1 PO at night for 5 days then go to twice daily 60 capsule 2   insulin glargine (LANTUS) 100 UNIT/ML injection Inject into the skin daily as needed (low blood glucose 180). Unknow dose to give if needed     loratadine (CLARITIN) 10 MG tablet Take 10 mg by mouth every evening.      midodrine (PROAMATINE) 10 MG tablet Take 1 tablet (10 mg total) by mouth every Tuesday, Thursday, and Saturday at 6 PM. (Patient taking differently: Take 10 mg by mouth every Tuesday, Thursday, and Saturday at 6 PM. In the evening  10 mg  on Mon. Wed and Friday Take 20 mg on Dialysis days Tues, thurs and Saturday. 10 mg when he gets there and 10 mg half way through)     nitroGLYCERIN (NITROSTAT) 0.4 MG SL tablet Place 1 tablet (0.4 mg total) under the tongue every 5 (five) minutes as needed for chest pain. 14 tablet 12   omeprazole (PRILOSEC OTC) 20 MG tablet Take 40 mg by mouth daily before breakfast.      prochlorperazine (COMPAZINE) 10 MG tablet Take 1 tablet (10 mg total) by mouth every 6 (six) hours as needed for nausea or  vomiting. 30 tablet 0   Sennosides (SENOKOT EXTRA STRENGTH) 17.2 MG TABS Take 34.4 mg by mouth at bedtime.     traMADol (ULTRAM) 50 MG tablet Take 50 mg by mouth every 12 (twelve) hours as needed (Pain).     warfarin (COUMADIN) 3 MG tablet TAKE 1 TO 2 TABLETS BY MOUTH DAILY AS DIRECTED BY  THE  COUMADIN  CLINIC 45 tablet 0   warfarin (COUMADIN) 3 MG tablet Take 1 to 3 tablets daily or as directed by the coumadin clinic 45 tablet 0   No current facility-administered medications for this visit.     Allergies:  Allergies  Allergen Reactions   Penicillins Rash and Other (See Comments)    Has patient had a PCN reaction causing immediate rash, facial/tongue/throat swelling, SOB or lightheadedness with hypotension: yes Has patient had a PCN reaction causing severe rash involving mucus membranes or skin necrosis: no Has patient had a PCN reaction that required hospitalization: no Has patient had a PCN reaction occurring within the last 10 years: no If all of the above answers are "NO", then may proceed with Cephalosporin use.       Physical Exam:  Blood pressure (!) 93/59, pulse (!) 117, temperature 97.8 F (36.6 C), temperature source Oral, resp. rate 18, height '6\' 1"'  (1.854 m), weight 292 lb (132.5 kg), SpO2 100 %.    ECOG: 1    General appearance: Alert, awake without any distress. Head: Atraumatic without abnormalities Oropharynx: Without any thrush or ulcers. Eyes: No scleral icterus. Lymph nodes: No lymphadenopathy noted in the cervical, supraclavicular, or axillary nodes Heart: Irregular without murmurs or gallops. Lung: Clear to auscultation without any rhonchi, wheezes or dullness to percussion. Abdomin: Soft, nontender without any shifting dullness or ascites. Musculoskeletal: No clubbing or cyanosis. Neurological: No motor or sensory deficits. Skin: No rashes or lesions.      Lab Results: Lab Results  Component Value Date   WBC 9.2 05/19/2021   HGB 11.6 (L)  05/19/2021   HCT 34.5 (L) 05/19/2021   MCV 91.3 05/19/2021   PLT 279 05/19/2021     Chemistry      Component Value Date/Time   NA 139 04/07/2021 0853   NA 139 04/02/2021 0908   K 4.2 04/07/2021 0853   CL 94 (L) 04/07/2021 0853   CO2 25 04/07/2021 0853   BUN 46 (H) 04/07/2021 0853   BUN 29 (H) 04/02/2021 0908   CREATININE 8.46 (HH) 04/07/2021 0853   GLU 96 03/14/2019 0000      Component Value Date/Time   CALCIUM 9.5 04/07/2021 0853   ALKPHOS 89 04/07/2021 0853   AST 23 04/07/2021 0853   ALT 23 04/07/2021 0853   BILITOT 0.5 04/07/2021 0853      IMPRESSION: Chest Impression:   1. Interval enlargement of LEFT upper lobe pulmonary nodule and RIGHT lobe pulmonary nodule. Nodules have the appearance  of metastatic renal cell carcinoma 2. Decrease in loculated LEFT effusion following lingular wedge resection. 3. No mediastinal lymphadenopathy   Abdomen / Pelvis Impression:   1. No evidence of recurrence in the RIGHT nephrectomy bed. 2. No evidence of metastatic disease in the pelvis. 3. No evidence skeletal metastasis 4. Stable small hyperenhancing lesion in the head of the pancreas.    Impression and Plan:    1.   Stage IV clear-cell renal cell carcinoma with sarcomatoid features and pulmonary involvement.   CT scan obtained on 05/12/2021 was personally reviewed and showed interval enlargement of the left upper pulmonary nodule as well as the right lobe nodule.  Otherwise no evidence of metastatic disease noted.  Treatment options were reviewed at this time.  Escalating systemic therapy to combination immunotherapy with ipilimumab and nivolumab, adding axitinib to Pembrolizumab could be another possibility.    Given is minimal residual systemic disease, I am in favor of continuing local treatment.  It would be reasonable to consider radiation therapy to these nodules and continue systemic therapy with Pembrolizumab and reserve therapy escalation unless he has more  widespread disease.  After discussion today, he is agreeable with this plan and will refer him to radiation oncology for an evaluation for possible radiation therapy to both enlarging pulmonary nodules.    2.  IV access: Peripheral veins are currently in use without any issues.   3.  Immune mediated complications: These were reiterated at this time.  Pneumonitis, colitis, thyroid disease among others were reviewed.  He is not experiencing any at this time.   4.  Antiemetics: Compazine is available to him without any nausea or vomiting.  5.  Renal failure: He is on dialysis without any recent exacerbation.  6.  Tachycardia and hypotension: Related to atrial fibrillation but overall vitals are within his baseline.   7.  Follow-up: He will return in 6 weeks for a follow-up visit.   30  minutes were dedicated to this visit.  The time was spent on reviewing laboratory data, disease status update, treatment choices and future plan of care discussion.     Zola Button, MD 9/21/20229:16 AM

## 2021-05-19 NOTE — Progress Notes (Signed)
Per Dr. Alen Blew, okay to treat with elevated creatinine and HR 104.

## 2021-05-19 NOTE — Telephone Encounter (Signed)
CRITICAL VALUE STICKER  CRITICAL VALUE: creatinine 7.54  RECEIVER (on-site recipient of call): Carlean Jews Jennifier Smitherman RN  DATE & TIME NOTIFIED: 05/19/21 @ 0935  MESSENGER (representative from lab):  MD NOTIFIED: Shadad  TIME OF NOTIFICATION: 4210  RESPONSE: aware; pt on dialysis

## 2021-05-25 NOTE — Progress Notes (Signed)
Thoracic Location of Tumor / Histology: Left Upper Lobe Lung- Clear cell renal carcinoma  Patient presented   CT CAP 05/12/2021: Interval enlargement of LEFT upper lobe pulmonary nodule and RIGHT lobe pulmonary nodule. Nodules have the appearance of metastatic renal cell carcinoma.  Decrease in loculated LEFT effusion following lingular wedge resection.  No mediastinal lymphadenopathy.  No evidence of recurrence in the RIGHT nephrectomy bed.  No evidence of metastatic disease in the pelvis.  No evidence skeletal metastasis.  Stable small hyperenhancing lesion in the head of the pancreas.  Pathology from [facility] Wedge Resection 10/16/2020   Tobacco/Marijuana/Snuff/ETOH use: None  Past/Anticipated interventions by cardiothoracic surgery, if any:  Dr. Roxan Hockey -Left Upper Lobe Wedge resection 10/16/2020  Past/Anticipated interventions by medical oncology, if any:  Dr. Alen Blew 05/19/2021 -Treatment options were reviewed at this time.  Escalating systemic therapy to combination immunotherapy with ipilimumab and nivolumab, adding axitinib to Pembrolizumab could be another possibility.   -Given is minimal residual systemic disease, I am in favor of continuing local treatment.  It would be reasonable to consider radiation therapy to these nodules and continue systemic therapy with Pembrolizumab and reserve therapy escalation unless he has more widespread disease.  -After discussion today, he is agreeable with this plan and will refer him to radiation oncology for an evaluation for possible radiation therapy to both enlarging pulmonary nodules. Current therapy: Pembrolizumab 400 mg every 6 weeks started in Jan 13, 2021.  He is here for cycle 4 of therapy.   Signs/Symptoms Weight changes, if any: Notes intentional weight loss, trying t exercise. Respiratory complaints, if any: He notes his breathing is doing pretty good, has some SOB or winded feeling when he walks very long distances. Hemoptysis, if any:  Denies Pain issues, if any:  None  SAFETY ISSUES: Prior radiation? No Pacemaker/ICD? No  Possible current pregnancy? N/a Is the patient on methotrexate? No  Current Complaints / other details:   -Diagnosed with Kidney cancer in 2019, On dialysis [facility] -Radical nephrectomy 05/16/2018, 10.2 cm clear cell carcinoma arising in the kidney with negative margins and no sarcomatoid or rhabdoid features.

## 2021-05-26 ENCOUNTER — Ambulatory Visit
Admission: RE | Admit: 2021-05-26 | Discharge: 2021-05-26 | Disposition: A | Discharge status: Home / Self Care | Payer: Medicare Other | Source: Ambulatory Visit | Attending: Radiation Oncology | Admitting: Radiation Oncology

## 2021-05-26 ENCOUNTER — Other Ambulatory Visit: Payer: Self-pay

## 2021-05-26 ENCOUNTER — Encounter: Payer: Self-pay | Admitting: Radiation Oncology

## 2021-05-26 VITALS — BP 97/66 | HR 112 | Temp 97.8°F | Resp 20 | Ht 73.0 in | Wt 290.4 lb

## 2021-05-26 DIAGNOSIS — C7801 Secondary malignant neoplasm of right lung: Secondary | ICD-10-CM | POA: Diagnosis not present

## 2021-05-26 DIAGNOSIS — K760 Fatty (change of) liver, not elsewhere classified: Secondary | ICD-10-CM | POA: Diagnosis not present

## 2021-05-26 DIAGNOSIS — E1122 Type 2 diabetes mellitus with diabetic chronic kidney disease: Secondary | ICD-10-CM | POA: Diagnosis not present

## 2021-05-26 DIAGNOSIS — I4891 Unspecified atrial fibrillation: Secondary | ICD-10-CM | POA: Diagnosis not present

## 2021-05-26 DIAGNOSIS — C641 Malignant neoplasm of right kidney, except renal pelvis: Secondary | ICD-10-CM | POA: Diagnosis not present

## 2021-05-26 DIAGNOSIS — Z79899 Other long term (current) drug therapy: Secondary | ICD-10-CM | POA: Diagnosis not present

## 2021-05-26 DIAGNOSIS — C7802 Secondary malignant neoplasm of left lung: Secondary | ICD-10-CM | POA: Diagnosis present

## 2021-05-26 DIAGNOSIS — E1151 Type 2 diabetes mellitus with diabetic peripheral angiopathy without gangrene: Secondary | ICD-10-CM | POA: Diagnosis not present

## 2021-05-26 DIAGNOSIS — K219 Gastro-esophageal reflux disease without esophagitis: Secondary | ICD-10-CM | POA: Diagnosis not present

## 2021-05-26 DIAGNOSIS — E119 Type 2 diabetes mellitus without complications: Secondary | ICD-10-CM | POA: Diagnosis not present

## 2021-05-26 DIAGNOSIS — I120 Hypertensive chronic kidney disease with stage 5 chronic kidney disease or end stage renal disease: Secondary | ICD-10-CM | POA: Diagnosis not present

## 2021-05-26 DIAGNOSIS — Z7901 Long term (current) use of anticoagulants: Secondary | ICD-10-CM | POA: Diagnosis not present

## 2021-05-26 DIAGNOSIS — N184 Chronic kidney disease, stage 4 (severe): Secondary | ICD-10-CM | POA: Insufficient documentation

## 2021-05-26 DIAGNOSIS — Z992 Dependence on renal dialysis: Secondary | ICD-10-CM | POA: Insufficient documentation

## 2021-05-26 NOTE — Progress Notes (Signed)
Radiation Oncology         [phone]) [phone] ________________________________  Name: George Lewis.        MRN: [id number]  Date of Service: 05/26/2021 DOB: [birth year]  VO:HYWVPX, Herb Grays, PA-C  Alen Blew Mathis Dad, MD     REFERRING PHYSICIAN: Wyatt Portela, MD   DIAGNOSIS: The primary encounter diagnosis was Renal cell carcinoma, right ([place]). A diagnosis of Malignant neoplasm metastatic to both lungs [facility]) was also pertinent to this visit.   HISTORY OF PRESENT ILLNESS: George Lewis. is a 65 y.o. male seen at the request of Dr. 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  The patient was originally diagnosed with his kidney cancer in 2019 and underwent laparoscopic radical nephrectomy on 05/16/2018.  Final pathology showed a 10.2 cm clear-cell carcinoma arising in the kidney with negative margins and no sarcomatoid or rhabdoid features.  He also was found to have a lesion in the left upper lobe and in February 2022 underwent left upper lobe wedge resection with Dr. Roxan Hockey showing a clear-cell renal carcinoma with negative margins he has been receiving pembrolizumab since May 2022.  Recent CT of the chest on 05/12/2021 showed an increase in a left upper lobe nodule measuring 13 mm and previously was 10 mm in April of this year.  In the azygoesophageal recess of the right lower lobe there was a 12 mm lesion previously 10 mm.  Otherwise no evidence of disease is seen in the abdomen or pelvis. Dr. Alen Blew continues to plan for ongoing systemic pembrolizumab and the patient is seen to discuss stereotactic body radiotherapy to these 2 enlarging pulmonary nodules. Of note he his a patient of [facility]. [name] and his a hemodialysis patient who is dialyzed T/TH/Sat.     PREVIOUS RADIATION THERAPY: No   PAST MEDICAL HISTORY:  Past Medical History:  Diagnosis Date   Anemia    Arthritis    knees   Atrial fibrillation  ([place])    Bilateral renal cysts 03/23/2018   Noted MRI ABD   Cancer ([place])    right kidney cancer   Cancer ([place])    pancreatic   Cholelithiasis 03/19/2018   noted on CT AB/Pelvis   CKD (chronic kidney disease), stage IV [facility])    nephrologist-- dr Hollie Salk [name]);  05-01-2018 has not started dialysis, scheduled for AV fistula creation 05-07-2018   Diabetes mellitus without complication ([place])    Diverticulosis of colon 04/19/2018   Noted on CT abd/pelvis   Dysrhythmia    afib   GERD (gastroesophageal reflux disease)    Hepatic steatosis 03/19/2018   Mild diffuse, noted on CT AB/Pelvis   History of kidney stones    History of pulmonary embolus (PE) 03/2008   treated with coumadin for 6 months   History of sepsis 04/20/2018   per discharge note , probable UTI   Hypertension    IBS (irritable bowel syndrome)    Nocturia    OSA on CPAP    uses CPAP nightly   Pancreatic lesion    0.4cm cystic per CT 07/ 2019   Peripheral vascular disease (HCC)    Pneumonia    x 1   Pulmonary nodule    Solid 87m Right Lower Lobe   Right renal mass 04/10/2018   new dx--  scheduled for nephrectomy 05-16-2018   S/P ureteral stent placement: 04/16/2018 04/19/2018       PAST SURGICAL HISTORY: Past Surgical  History:  Procedure Laterality Date   AV FISTULA PLACEMENT Left 05/07/2018   Procedure: Creation of Left arm Radiocephalic Fistula;  Surgeon: Marty Heck, MD;  Location: [place];  Service: Vascular;  Laterality: Left;   AV FISTULA PLACEMENT Left 05/15/2019   Procedure: Creation of Brachiocephalic fistula left arm;  Surgeon: Waynetta Sandy, MD;  Location: [place];  Service: Vascular;  Laterality: Left;   Ong Left 07/15/2019   Procedure: BASILIC VEIN TRANSPOSITION SECOND STAGE;  Surgeon: Waynetta Sandy, MD;  Location: [place];  Service: Vascular;  Laterality: Left;   BRONCHIAL BRUSHINGS  08/25/2020   Procedure: BRONCHIAL BRUSHINGS;  Surgeon:  Garner Nash, DO;  Location: [place] [facility];  Service: Pulmonary;;   BRONCHIAL NEEDLE ASPIRATION BIOPSY  08/25/2020   Procedure: BRONCHIAL NEEDLE ASPIRATION BIOPSIES;  Surgeon: Garner Nash, DO;  Location: [place] [facility];  Service: Pulmonary;;   BRONCHIAL WASHINGS  08/25/2020   Procedure: BRONCHIAL WASHINGS;  Surgeon: Garner Nash, DO;  Location: [place];  Service: Pulmonary;;   COLONOSCOPY     CYSTOSCOPY/RETROGRADE/URETEROSCOPY/STONE EXTRACTION WITH BASKET  x2 last one 1990s approx.   CYSTOSCOPY/URETEROSCOPY/HOLMIUM LASER/STENT PLACEMENT Left 04/16/2018   Procedure: CYSTOSCOPY/LEFT URETEROSCOPY/LEFT RETROGRADE/STENT PLACEMENT;  Surgeon: Lucas Mallow, MD;  [facility];  Service: Urology;  Laterality: Left;   EUS N/A 05/03/2018   Procedure: UPPER ENDOSCOPIC ULTRASOUND (EUS) RADIAL;  Surgeon: Milus Banister, MD;  [facility];  Service: Gastroenterology;  Laterality: N/A;   EUS N/A 05/03/2018   Procedure: UPPER ENDOSCOPIC ULTRASOUND (EUS) LINEAR;  Surgeon: Milus Banister, MD;  [facility];  Service: Gastroenterology;  Laterality: N/A;   EUS  10/05/2020   upper GI   EXTRACORPOREAL SHOCK WAVE LITHOTRIPSY  x3  last one 2004 approx.   FINE NEEDLE ASPIRATION N/A 05/03/2018   Procedure: FINE NEEDLE ASPIRATION (FNA) LINEAR;  Surgeon: Milus Banister, MD;  [facility];  Service: Gastroenterology;  Laterality: N/A;   INTERCOSTAL NERVE BLOCK Left 10/16/2020   Procedure: INTERCOSTAL NERVE BLOCK;  Surgeon: Melrose Nakayama, MD;  Location: [place];  Service: Thoracic;  Laterality: Left;   LAPAROSCOPIC NEPHRECTOMY, HAND ASSISTED Right 05/16/2018   Procedure: HAND ASSISTED LAPAROSCOPIC RIGHT NEPHRECTOMY;  Surgeon: Lucas Mallow, MD;  Location[facility];  Service: Urology;  Laterality: Right;   left index finger attachment  1980s   3-4 surgeries   NODE DISSECTION Left 10/16/2020   Procedure: NODE DISSECTION;  Surgeon: Melrose Nakayama, MD;   Location: [place];  Service: Thoracic;  Laterality: Left;   TOE SURGERY  1980s   in beween 2nd and 3rd toes cyst removed   UPPER GI ENDOSCOPY     VIDEO BRONCHOSCOPY WITH ENDOBRONCHIAL NAVIGATION Bilateral 08/25/2020   Procedure: VIDEO BRONCHOSCOPY WITH ENDOBRONCHIAL NAVIGATION;  Surgeon: Garner Nash, DO;  Location: [place];  Service: Pulmonary;  Laterality: Bilateral;     FAMILY HISTORY:  Family History  Problem Relation Age of Onset   Alzheimer's disease Mother    Alzheimer's disease Father    Heart disease Father    Heart attack Father    Cancer - Other Sister      SOCIAL HISTORY:  reports that he has never smoked. He has never used smokeless tobacco. He reports that he does not drink alcohol and does not use drugs. The patient is married and lives in [place], [place] in [place]. He's accompanied by his wife.   ALLERGIES: Penicillins   MEDICATIONS:  Current Outpatient Medications  Medication Sig Dispense Refill   Accu-Chek FastClix Lancets MISC Use to check fasting blood sugar and 2 hrs after largest meal. 100 each 0   ACCU-CHEK GUIDE test strip USE 1 STRIP TO CHECK FASTING BLOOD SUGAR AND 2 HOURS AFTER LARGEST MEAL 100 each 3   acetaminophen (TYLENOL) 500 MG tablet Take 1,000 mg by mouth every 8 (eight) hours as needed for mild pain or headache.     allopurinol (ZYLOPRIM) 100 MG tablet Take 1 tablet (100 mg total) by mouth every evening. 30 tablet 0   amiodarone (PACERONE) 200 MG tablet Take 1 tablet (200 mg total) by mouth daily. (Patient taking differently: Take 200 mg by mouth every evening.) 90 tablet 3   atorvastatin (LIPITOR) 40 MG tablet Take 1 tablet (40 mg total) by mouth every evening.     blood glucose meter kit and supplies Dispense based on patient and insurance preference. Use up to four times daily as directed. (FOR ICD-10 E10.9, E11.9). 1 each 0   diphenhydramine-acetaminophen (TYLENOL PM) 25-500 MG TABS tablet Take 2 tablets by mouth at bedtime  as needed (sleep/headache).     ferric citrate (AURYXIA) 1 GM 210 MG(Fe) tablet Take 1 tablet (210 mg total) by mouth daily with supper. 270 tablet    fluticasone (FLONASE) 50 MCG/ACT nasal spray Place 1-2 sprays into both nostrils daily as needed for allergies or rhinitis.     gabapentin (NEURONTIN) 100 MG capsule Take 1 capsule (100 mg total) by mouth 2 (two) times daily. Take 1 PO at night for 5 days then go to twice daily 60 capsule 2   insulin glargine (LANTUS) 100 UNIT/ML injection Inject into the skin daily as needed (low blood glucose 180). Unknow dose to give if needed     lidocaine-prilocaine (EMLA) cream Apply topically.     loratadine (CLARITIN) 10 MG tablet Take 10 mg by mouth every evening.      midodrine (PROAMATINE) 10 MG tablet Take 1 tablet (10 mg total) by mouth every Tuesday, Thursday, and Saturday at 6 PM. (Patient taking differently: Take 10 mg by mouth every Tuesday, Thursday, and Saturday at 6 PM. In the evening 10 mg  on Mon. Wed and Friday Take 20 mg on Dialysis days Tues, thurs and Saturday. 10 mg when he gets there and 10 mg half way through)     nitroGLYCERIN (NITROSTAT) 0.4 MG SL tablet Place 1 tablet (0.4 mg total) under the tongue every 5 (five) minutes as needed for chest pain. 14 tablet 12   omeprazole (PRILOSEC OTC) 20 MG tablet Take 40 mg by mouth daily before breakfast.      pembrolizumab (KEYTRUDA) 100 MG/4ML SOLN Inject into the vein.     prochlorperazine (COMPAZINE) 10 MG tablet Take 1 tablet (10 mg total) by mouth every 6 (six) hours as needed for nausea or vomiting. 30 tablet 0   Sennosides (SENOKOT EXTRA STRENGTH) 17.2 MG TABS Take 34.4 mg by mouth at bedtime.     traMADol (ULTRAM) 50 MG tablet Take 50 mg by mouth every 12 (twelve) hours as needed (Pain).     warfarin (COUMADIN) 3 MG tablet TAKE 1 TO 2 TABLETS BY MOUTH DAILY AS DIRECTED BY  THE  COUMADIN  CLINIC 45 tablet 0   warfarin (COUMADIN) 3 MG tablet Take 1 to 3 tablets daily or as directed by the  coumadin clinic 45 tablet 0   No current facility-administered medications for this encounter.     REVIEW OF SYSTEMS: On review of  systems, the patient reports that he is doing well overall. He denies any chest pain, shortness of breath, cough, fevers, chills, night sweats, unintended weight changes. He denies any bowel or bladder disturbances, and denies abdominal pain, nausea or vomiting. He does have some dry and erythematous rash of his forearms as a result of his immunotherapy. He denies any new musculoskeletal or joint aches or pains. A complete review of systems is obtained and is otherwise negative.     PHYSICAL EXAM:  Wt Readings from Last 3 Encounters:  05/26/21 290 lb 6.4 oz (131.7 kg)  05/19/21 292 lb (132.5 kg)  04/07/21 292 lb 9 oz (132.7 kg)   Temp Readings from Last 3 Encounters:  05/26/21 97.8 F (36.6 C)  05/19/21 97.8 F (36.6 C) (Oral)  04/07/21 (!) 97.1 F (36.2 C) (Tympanic)   BP Readings from Last 3 Encounters:  05/26/21 97/66  05/19/21 (!) 93/59  04/07/21 99/66   Pulse Readings from Last 3 Encounters:  05/26/21 (!) 112  05/19/21 (!) 104  05/19/21 (!) 117   Pain Assessment Pain Score: 0-No pain/10  In general this is a well appearing caucasian male in no acute distress. He's alert and oriented x4 and appropriate throughout the examination. Cardiopulmonary assessment is negative for acute distress and he exhibits normal effort.     ECOG = 0  0 - Asymptomatic (Fully active, able to carry on all predisease activities without restriction)  1 - Symptomatic but completely ambulatory (Restricted in physically strenuous activity but ambulatory and able to carry out work of a light or sedentary nature. For example, light housework, office work)  2 - Symptomatic, <50% in bed during the day (Ambulatory and capable of all self care but unable to carry out any work activities. Up and about more than 50% of waking hours)  3 - Symptomatic, >50% in bed, but not  bedbound (Capable of only limited self-care, confined to bed or chair 50% or more of waking hours)  4 - Bedbound (Completely disabled. Cannot carry on any self-care. Totally confined to bed or chair)  5 - Death   Eustace Pen MM, Creech RH, Tormey DC, et al. [phone]). "Toxicity and response criteria of the [facility]". [place] Oncol. 5 (6): 649-55    LABORATORY DATA:  Lab Results  Component Value Date   WBC 9.2 05/19/2021   HGB 11.6 (L) 05/19/2021   HCT 34.5 (L) 05/19/2021   MCV 91.3 05/19/2021   PLT 279 05/19/2021   Lab Results  Component Value Date   NA 141 05/19/2021   K 4.0 05/19/2021   CL 100 05/19/2021   CO2 24 05/19/2021   Lab Results  Component Value Date   ALT 14 05/19/2021   AST 19 05/19/2021   ALKPHOS 71 05/19/2021   BILITOT 0.5 05/19/2021      RADIOGRAPHY: CT ABDOMEN PELVIS W WO CONTRAST  Result Date: 05/12/2021 CLINICAL DATA:  History of renal cell carcinoma with lung metastasis. Post RIGHT nephrectomy. Wedge resection of LEFT lingular lesion. Chemotherapy ongoing. EXAM: CT CHEST WITH CONTRAST CT ABDOMEN AND PELVIS WITH AND WITHOUT CONTRAST TECHNIQUE: Multidetector CT imaging of the chest was performed during intravenous contrast administration. Multidetector CT imaging of the abdomen and pelvis was performed following the standard protocol before and during bolus administration of intravenous contrast. CONTRAST:  138m OMNIPAQUE IOHEXOL 350 MG/ML SOLN COMPARISON:  CT 12/25/2020 FINDINGS: CT CHEST FINDINGS Cardiovascular: Coronary artery calcification and aortic atherosclerotic calcification. Mediastinum/Nodes: No axillary or supraclavicular adenopathy. No  mediastinal or hilar adenopathy. No pericardial fluid. Esophagus normal. Lungs/Pleura: Post lingular wedge resection. Small posterior loculated pleural effusion is decreased in volume compared to prior. LEFT upper lobe nodule measuring 13 mm (image 54/series 18) increased from 10 mm. Nodule in  the azygoesophageal recess of the RIGHT lower lobe measures 12 mm (image 102/18) increased from 10 mm. Musculoskeletal: No aggressive osseous lesion. CT ABDOMEN AND PELVIS FINDINGS Hepatobiliary: No focal hepatic lesion. No biliary ductal dilatation. Gallbladder is normal. Common bile duct is normal. Pancreas: Small 10 mm hyperenhancing lesion in the head of the pancreas (58/series 5). No interval change. Spleen: Normal spleen Adrenals/urinary tract: Adrenal glands normal. Post RIGHT nephrectomy. No nodularity in nephrectomy bed. Nonenhancing cyst of the LEFT kidney. Enhancing LEFT renal lesion. LEFT ureter bladder Stomach/Bowel: Stomach, small bowel, appendix, and cecum are normal. The colon and rectosigmoid colon are normal. Vascular/Lymphatic: Abdominal aorta is normal caliber. There is no retroperitoneal or periportal lymphadenopathy. No pelvic lymphadenopathy. Reproductive: Post hysterectomy.  Adnexa unremarkable Other: No free fluid. Musculoskeletal: No aggressive osseous lesion. IMPRESSION: Chest Impression: 1. Interval enlargement of LEFT upper lobe pulmonary nodule and RIGHT lobe pulmonary nodule. Nodules have the appearance of metastatic renal cell carcinoma 2. Decrease in loculated LEFT effusion following lingular wedge resection. 3. No mediastinal lymphadenopathy Abdomen / Pelvis Impression: 1. No evidence of recurrence in the RIGHT nephrectomy bed. 2. No evidence of metastatic disease in the pelvis. 3. No evidence skeletal metastasis 4. Stable small hyperenhancing lesion in the head of the pancreas. Electronically Signed   By: Suzy Bouchard M.D.   On: 05/12/2021 15:35   CT CHEST W CONTRAST  Result Date: 05/12/2021 CLINICAL DATA:  History of renal cell carcinoma with lung metastasis. Post RIGHT nephrectomy. Wedge resection of LEFT lingular lesion. Chemotherapy ongoing. EXAM: CT CHEST WITH CONTRAST CT ABDOMEN AND PELVIS WITH AND WITHOUT CONTRAST TECHNIQUE: Multidetector CT imaging of the chest was  performed during intravenous contrast administration. Multidetector CT imaging of the abdomen and pelvis was performed following the standard protocol before and during bolus administration of intravenous contrast. CONTRAST:  151m OMNIPAQUE IOHEXOL 350 MG/ML SOLN COMPARISON:  CT 12/25/2020 FINDINGS: CT CHEST FINDINGS Cardiovascular: Coronary artery calcification and aortic atherosclerotic calcification. Mediastinum/Nodes: No axillary or supraclavicular adenopathy. No mediastinal or hilar adenopathy. No pericardial fluid. Esophagus normal. Lungs/Pleura: Post lingular wedge resection. Small posterior loculated pleural effusion is decreased in volume compared to prior. LEFT upper lobe nodule measuring 13 mm (image 54/series 18) increased from 10 mm. Nodule in the azygoesophageal recess of the RIGHT lower lobe measures 12 mm (image 102/18) increased from 10 mm. Musculoskeletal: No aggressive osseous lesion. CT ABDOMEN AND PELVIS FINDINGS Hepatobiliary: No focal hepatic lesion. No biliary ductal dilatation. Gallbladder is normal. Common bile duct is normal. Pancreas: Small 10 mm hyperenhancing lesion in the head of the pancreas (58/series 5). No interval change. Spleen: Normal spleen Adrenals/urinary tract: Adrenal glands normal. Post RIGHT nephrectomy. No nodularity in nephrectomy bed. Nonenhancing cyst of the LEFT kidney. Enhancing LEFT renal lesion. LEFT ureter bladder Stomach/Bowel: Stomach, small bowel, appendix, and cecum are normal. The colon and rectosigmoid colon are normal. Vascular/Lymphatic: Abdominal aorta is normal caliber. There is no retroperitoneal or periportal lymphadenopathy. No pelvic lymphadenopathy. Reproductive: Post hysterectomy.  Adnexa unremarkable Other: No free fluid. Musculoskeletal: No aggressive osseous lesion. IMPRESSION: Chest Impression: 1. Interval enlargement of LEFT upper lobe pulmonary nodule and RIGHT lobe pulmonary nodule. Nodules have the appearance of metastatic renal cell  carcinoma 2. Decrease in loculated LEFT effusion following lingular wedge  resection. 3. No mediastinal lymphadenopathy Abdomen / Pelvis Impression: 1. No evidence of recurrence in the RIGHT nephrectomy bed. 2. No evidence of metastatic disease in the pelvis. 3. No evidence skeletal metastasis 4. Stable small hyperenhancing lesion in the head of the pancreas. Electronically Signed   By: Suzy Bouchard M.D.   On: 05/12/2021 15:35       IMPRESSION/PLAN: 1. Progressive Stage IV renal cell carcinoma with pulmonary disease. Dr. Lisbeth Renshaw discusses the patient's course and the rationale to consider focally ablative therapy based on location and histology. He recommends stereotactic body radiotherapy (SBRT) to both lesions in the LUL and RLL. We discussed the risks, benefits, short, and long term effects of radiotherapy, as well as the definitive ntent, and the patient is interested in proceeding. Dr. Lisbeth Renshaw discusses the delivery and logistics of radiotherapy and anticipates a course of 3-5 fractions  of radiotherapy to both lesions. Written consent is obtained and placed in the chart, a copy was provided to the patient. He will receive a call from our office to coordinate simulation.  2. ESRD, on hemodialysis. The patient will continue dialysis T/Th/Sat through Dr. Johnney Ou at [facility]. We will try to coordinate his radiation treatments on M/W/F.   In a visit lasting 60 minutes, greater than 50% of the time was spent face to face discussing the patient's condition, in preparation for the discussion, and coordinating the patient's care.   The above documentation reflects my direct findings during this shared patient visit. Please see the separate note by Dr. Lisbeth Renshaw on this date for the remainder of the patient's plan of care.    Carola Rhine, [facility]   **Disclaimer: This note was dictated with voice recognition software. Similar sounding words can inadvertently be transcribed and this note may  contain transcription errors which may not have been corrected upon publication of note.**

## 2021-06-01 ENCOUNTER — Other Ambulatory Visit: Payer: Self-pay

## 2021-06-01 ENCOUNTER — Ambulatory Visit (INDEPENDENT_AMBULATORY_CARE_PROVIDER_SITE_OTHER): Payer: Medicare Other

## 2021-06-01 DIAGNOSIS — I480 Paroxysmal atrial fibrillation: Secondary | ICD-10-CM | POA: Diagnosis not present

## 2021-06-01 DIAGNOSIS — Z5181 Encounter for therapeutic drug level monitoring: Secondary | ICD-10-CM

## 2021-06-01 LAB — POCT INR: INR: 2.8 (ref 2.0–3.0)

## 2021-06-01 NOTE — Patient Instructions (Signed)
Description   Continue taking warfarin 1 tablet (3mg ) daily except 1.5 (4.5mg )  tablet each Monday and Friday.  Repeat INR in 8 weeks.

## 2021-06-02 ENCOUNTER — Ambulatory Visit
Admission: RE | Admit: 2021-06-02 | Discharge: 2021-06-02 | Disposition: A | Discharge status: Home / Self Care | Payer: Medicare Other | Source: Ambulatory Visit | Attending: Radiation Oncology | Admitting: Radiation Oncology

## 2021-06-02 DIAGNOSIS — C7802 Secondary malignant neoplasm of left lung: Secondary | ICD-10-CM | POA: Insufficient documentation

## 2021-06-02 DIAGNOSIS — Z510 Encounter for antineoplastic radiation therapy: Secondary | ICD-10-CM | POA: Insufficient documentation

## 2021-06-02 DIAGNOSIS — C7801 Secondary malignant neoplasm of right lung: Secondary | ICD-10-CM | POA: Insufficient documentation

## 2021-06-02 DIAGNOSIS — C641 Malignant neoplasm of right kidney, except renal pelvis: Secondary | ICD-10-CM | POA: Diagnosis not present

## 2021-06-11 DIAGNOSIS — Z510 Encounter for antineoplastic radiation therapy: Secondary | ICD-10-CM | POA: Diagnosis not present

## 2021-06-14 ENCOUNTER — Ambulatory Visit
Admission: RE | Admit: 2021-06-14 | Discharge: 2021-06-14 | Disposition: A | Discharge status: Home / Self Care | Payer: Medicare Other | Source: Ambulatory Visit | Attending: Radiation Oncology | Admitting: Radiation Oncology

## 2021-06-14 ENCOUNTER — Other Ambulatory Visit: Payer: Self-pay

## 2021-06-14 DIAGNOSIS — Z510 Encounter for antineoplastic radiation therapy: Secondary | ICD-10-CM | POA: Diagnosis not present

## 2021-06-15 ENCOUNTER — Ambulatory Visit: Payer: Medicare Other | Admitting: Radiation Oncology

## 2021-06-16 ENCOUNTER — Other Ambulatory Visit: Payer: Self-pay

## 2021-06-16 ENCOUNTER — Ambulatory Visit
Admission: RE | Admit: 2021-06-16 | Discharge: 2021-06-16 | Disposition: A | Discharge status: Home / Self Care | Payer: Medicare Other | Source: Ambulatory Visit | Attending: Radiation Oncology | Admitting: Radiation Oncology

## 2021-06-16 DIAGNOSIS — Z510 Encounter for antineoplastic radiation therapy: Secondary | ICD-10-CM | POA: Diagnosis not present

## 2021-06-17 ENCOUNTER — Ambulatory Visit: Payer: Medicare Other | Admitting: Radiation Oncology

## 2021-06-18 ENCOUNTER — Other Ambulatory Visit: Payer: Self-pay

## 2021-06-18 ENCOUNTER — Ambulatory Visit
Admission: RE | Admit: 2021-06-18 | Discharge: 2021-06-18 | Disposition: A | Discharge status: Home / Self Care | Payer: Medicare Other | Source: Ambulatory Visit | Attending: Radiation Oncology | Admitting: Radiation Oncology

## 2021-06-18 ENCOUNTER — Telehealth: Payer: Self-pay | Admitting: Internal Medicine

## 2021-06-18 DIAGNOSIS — I480 Paroxysmal atrial fibrillation: Secondary | ICD-10-CM

## 2021-06-18 DIAGNOSIS — Z510 Encounter for antineoplastic radiation therapy: Secondary | ICD-10-CM | POA: Diagnosis not present

## 2021-06-18 MED ORDER — WARFARIN SODIUM 3 MG PO TABS: ORAL_TABLET | ORAL | 0 refills | Status: DC

## 2021-06-18 NOTE — Telephone Encounter (Signed)
*  STAT* If patient is at the pharmacy, call can be transferred to refill team.   1. Which medications need to be refilled? (please list name of each medication and dose if known)  warfarin (COUMADIN) 3 MG tablet  2. Which pharmacy/location (including street and city if local pharmacy) is medication to be sent to?  [place], [place]  3. Do they need a 30 day or 90 day supply? [place]

## 2021-06-21 NOTE — Telephone Encounter (Signed)
warfarin (COUMADIN) 3 MG tablet 60 tablet 0 06/18/2021    Sig: TAKE 1 TO 2 TABLETS BY MOUTH DAILY AS DIRECTED BY  THE  COUMADIN  CLINIC   Sent to pharmacy as: warfarin (COUMADIN) 3 MG tablet   E-Prescribing Status: Receipt confirmed by pharmacy (06/18/2021  3:33 PM EDT)

## 2021-06-22 ENCOUNTER — Other Ambulatory Visit: Payer: Self-pay

## 2021-06-22 ENCOUNTER — Ambulatory Visit
Admission: RE | Admit: 2021-06-22 | Discharge: 2021-06-22 | Disposition: A | Discharge status: Home / Self Care | Payer: Medicare Other | Source: Ambulatory Visit | Attending: Radiation Oncology | Admitting: Radiation Oncology

## 2021-06-22 DIAGNOSIS — Z510 Encounter for antineoplastic radiation therapy: Secondary | ICD-10-CM | POA: Diagnosis not present

## 2021-06-24 ENCOUNTER — Ambulatory Visit
Admission: RE | Admit: 2021-06-24 | Discharge: 2021-06-24 | Disposition: A | Discharge status: Home / Self Care | Payer: Medicare Other | Source: Ambulatory Visit | Attending: Radiation Oncology | Admitting: Radiation Oncology

## 2021-06-24 ENCOUNTER — Other Ambulatory Visit: Payer: Self-pay

## 2021-06-24 ENCOUNTER — Encounter: Payer: Self-pay | Admitting: Radiation Oncology

## 2021-06-24 DIAGNOSIS — Z510 Encounter for antineoplastic radiation therapy: Secondary | ICD-10-CM | POA: Diagnosis not present

## 2021-06-30 ENCOUNTER — Other Ambulatory Visit: Payer: Self-pay

## 2021-06-30 ENCOUNTER — Inpatient Hospital Stay (HOSPITAL_BASED_OUTPATIENT_CLINIC_OR_DEPARTMENT_OTHER): Payer: Medicare Other | Admitting: Oncology

## 2021-06-30 ENCOUNTER — Inpatient Hospital Stay: Payer: Medicare Other

## 2021-06-30 ENCOUNTER — Inpatient Hospital Stay: Payer: Medicare Other | Attending: Oncology

## 2021-06-30 VITALS — BP 100/61 | HR 122 | Temp 97.9°F | Resp 20 | Wt 287.6 lb

## 2021-06-30 VITALS — HR 118

## 2021-06-30 DIAGNOSIS — C7802 Secondary malignant neoplasm of left lung: Secondary | ICD-10-CM | POA: Diagnosis not present

## 2021-06-30 DIAGNOSIS — Z992 Dependence on renal dialysis: Secondary | ICD-10-CM | POA: Insufficient documentation

## 2021-06-30 DIAGNOSIS — N179 Acute kidney failure, unspecified: Secondary | ICD-10-CM | POA: Insufficient documentation

## 2021-06-30 DIAGNOSIS — C649 Malignant neoplasm of unspecified kidney, except renal pelvis: Secondary | ICD-10-CM | POA: Insufficient documentation

## 2021-06-30 DIAGNOSIS — C641 Malignant neoplasm of right kidney, except renal pelvis: Secondary | ICD-10-CM

## 2021-06-30 DIAGNOSIS — C7801 Secondary malignant neoplasm of right lung: Secondary | ICD-10-CM | POA: Diagnosis not present

## 2021-06-30 DIAGNOSIS — Z5112 Encounter for antineoplastic immunotherapy: Secondary | ICD-10-CM | POA: Diagnosis not present

## 2021-06-30 DIAGNOSIS — Z79899 Other long term (current) drug therapy: Secondary | ICD-10-CM | POA: Insufficient documentation

## 2021-06-30 DIAGNOSIS — E032 Hypothyroidism due to medicaments and other exogenous substances: Secondary | ICD-10-CM

## 2021-06-30 LAB — CBC WITH DIFFERENTIAL (CANCER CENTER ONLY)
Abs Immature Granulocytes: 0.15 10*3/uL — ABNORMAL HIGH (ref 0.00–0.07)
Basophils Absolute: 0.1 10*3/uL (ref 0.0–0.1)
Basophils Relative: 1 %
Eosinophils Absolute: 0.1 10*3/uL (ref 0.0–0.5)
Eosinophils Relative: 2 %
HCT: 36.9 % — ABNORMAL LOW (ref 39.0–52.0)
Hemoglobin: 12.2 g/dL — ABNORMAL LOW (ref 13.0–17.0)
Immature Granulocytes: 2 %
Lymphocytes Relative: 12 %
Lymphs Abs: 1 10*3/uL (ref 0.7–4.0)
MCH: 30.5 pg (ref 26.0–34.0)
MCHC: 33.1 g/dL (ref 30.0–36.0)
MCV: 92.3 fL (ref 80.0–100.0)
Monocytes Absolute: 0.8 10*3/uL (ref 0.1–1.0)
Monocytes Relative: 9 %
Neutro Abs: 6.1 10*3/uL (ref 1.7–7.7)
Neutrophils Relative %: 74 %
Platelet Count: 265 10*3/uL (ref 150–400)
RBC: 4 MIL/uL — ABNORMAL LOW (ref 4.22–5.81)
RDW: 14.1 % (ref 11.5–15.5)
WBC Count: 8.2 10*3/uL (ref 4.0–10.5)
nRBC: 0 % (ref 0.0–0.2)

## 2021-06-30 LAB — CMP (CANCER CENTER ONLY)
ALT: 15 U/L (ref 0–44)
AST: 17 U/L (ref 15–41)
Albumin: 4.1 g/dL (ref 3.5–5.0)
Alkaline Phosphatase: 69 U/L (ref 38–126)
Anion gap: 19 — ABNORMAL HIGH (ref 5–15)
BUN: 44 mg/dL — ABNORMAL HIGH (ref 8–23)
CO2: 22 mmol/L (ref 22–32)
Calcium: 9.1 mg/dL (ref 8.9–10.3)
Chloride: 97 mmol/L — ABNORMAL LOW (ref 98–111)
Creatinine: 9.98 mg/dL (ref 0.61–1.24)
GFR, Estimated: 5 mL/min — ABNORMAL LOW (ref >60)
Glucose, Bld: 126 mg/dL — ABNORMAL HIGH (ref 70–99)
Potassium: 4.4 mmol/L (ref 3.5–5.1)
Sodium: 138 mmol/L (ref 135–145)
Total Bilirubin: 0.5 mg/dL (ref 0.3–1.2)
Total Protein: 8.2 g/dL — ABNORMAL HIGH (ref 6.5–8.1)

## 2021-06-30 LAB — TSH: TSH: 1.649 u[IU]/mL (ref 0.320–4.118)

## 2021-06-30 MED ORDER — SODIUM CHLORIDE 0.9 % IV SOLN
400.0000 mg | Freq: Once | INTRAVENOUS | Status: AC
  Administered 2021-06-30: 400 mg via INTRAVENOUS
  Filled 2021-06-30: qty 16

## 2021-06-30 MED ORDER — SODIUM CHLORIDE 0.9 % IV SOLN: Freq: Once | INTRAVENOUS | Status: AC

## 2021-06-30 NOTE — Progress Notes (Signed)
OK to trt w/ elevated HR per MD.

## 2021-06-30 NOTE — Progress Notes (Signed)
Hematology and Oncology Follow Up Visit  Diquan Kassis [id number] 1955/09/30 65 y.o. 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  He was originally diagnosed with localized disease in 2019.   Prior Therapy:  He underwent laparoscopic radical nephrectomy completed on May 16, 2018 under the care of Dr. Gloriann Loan.  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  Histological grade 4.   He underwent robotic assisted left upper lobe wedge resection completed by Dr. Roxan Hockey on October 16, 2020.  The final pathology showed clear-cell renal cell carcinoma with negative margins.    He is status post radiation therapy to the left upper lobe and the right lower lobe pulmonary nodules.  October 2022.   Current therapy: Pembrolizumab 400 mg every 6 weeks started in Jan 13, 2021.  He is here for cycle 5 of therapy.  Interim History: Mr. Speigner presents today for a follow-up visit.  Since the last visit,     Medications: Updated on review. Current Outpatient Medications  Medication Sig Dispense Refill   Accu-Chek FastClix Lancets MISC Use to check fasting blood sugar and 2 hrs after largest meal. 100 each 0   ACCU-CHEK GUIDE test strip USE 1 STRIP TO CHECK FASTING BLOOD SUGAR AND 2 HOURS AFTER LARGEST MEAL 100 each 3   acetaminophen (TYLENOL) 500 MG tablet Take 1,000 mg by mouth every 8 (eight) hours as needed for mild pain or headache.     allopurinol (ZYLOPRIM) 100 MG tablet Take 1 tablet (100 mg total) by mouth every evening. 30 tablet 0   amiodarone (PACERONE) 200 MG tablet Take 1 tablet (200 mg total) by mouth daily. (Patient taking differently: Take 200 mg by mouth every evening.) 90 tablet 3   atorvastatin  (LIPITOR) 40 MG tablet Take 1 tablet (40 mg total) by mouth every evening.     blood glucose meter kit and supplies Dispense based on patient and insurance preference. Use up to four times daily as directed. (FOR ICD-10 E10.9, E11.9). 1 each 0   diphenhydramine-acetaminophen (TYLENOL PM) 25-500 MG TABS tablet Take 2 tablets by mouth at bedtime as needed (sleep/headache).     ferric citrate (AURYXIA) 1 GM 210 MG(Fe) tablet Take 1 tablet (210 mg total) by mouth daily with supper. 270 tablet    fluticasone (FLONASE) 50 MCG/ACT nasal spray Place 1-2 sprays into both nostrils daily as needed for allergies or rhinitis.     gabapentin (NEURONTIN) 100 MG capsule Take 1 capsule (100 mg total) by mouth 2 (two) times daily. Take 1 PO at night for 5 days then go to twice daily 60 capsule 2   insulin glargine (LANTUS) 100 UNIT/ML injection Inject into the skin daily as needed (low blood glucose 180). Unknow dose to give if needed     lidocaine-prilocaine (EMLA) cream Apply topically.     loratadine (CLARITIN) 10 MG tablet Take 10 mg by mouth every evening.      midodrine (PROAMATINE) 10 MG tablet Take 1 tablet (10 mg total) by mouth every Tuesday, Thursday, and Saturday at 6 PM. (Patient taking differently: Take 10 mg by mouth every Tuesday, Thursday, and Saturday at 6 PM. In the evening 10 mg  on Mon. Wed and Friday Take 20 mg on Dialysis days Tues, thurs and Saturday. 10 mg when  he gets there and 10 mg half way through)     nitroGLYCERIN (NITROSTAT) 0.4 MG SL tablet Place 1 tablet (0.4 mg total) under the tongue every 5 (five) minutes as needed for chest pain. 14 tablet 12   omeprazole (PRILOSEC OTC) 20 MG tablet Take 40 mg by mouth daily before breakfast.      pembrolizumab (KEYTRUDA) 100 MG/4ML SOLN Inject into the vein.     prochlorperazine (COMPAZINE) 10 MG tablet Take 1 tablet (10 mg total) by mouth every 6 (six) hours as needed for nausea or vomiting. 30 tablet 0   Sennosides (SENOKOT EXTRA STRENGTH) 17.2  MG TABS Take 34.4 mg by mouth at bedtime.     traMADol (ULTRAM) 50 MG tablet Take 50 mg by mouth every 12 (twelve) hours as needed (Pain).     warfarin (COUMADIN) 3 MG tablet TAKE 1 TO 2 TABLETS BY MOUTH DAILY AS DIRECTED BY  THE  COUMADIN  CLINIC 60 tablet 0   No current facility-administered medications for this visit.     Allergies:  Allergies  Allergen Reactions   Penicillins Rash and Other (See Comments)    Has patient had a PCN reaction causing immediate rash, facial/tongue/throat swelling, SOB or lightheadedness with hypotension: yes Has patient had a PCN reaction causing severe rash involving mucus membranes or skin necrosis: no Has patient had a PCN reaction that required hospitalization: no Has patient had a PCN reaction occurring within the last 10 years: no If all of the above answers are "NO", then may proceed with Cephalosporin use.       Physical Exam:      ECOG: 1    General appearance: Comfortable appearing without any discomfort Head: Normocephalic without any trauma Oropharynx: Mucous membranes are moist and pink without any thrush or ulcers. Eyes: Pupils are equal and round reactive to light. Lymph nodes: No cervical, supraclavicular, inguinal or axillary lymphadenopathy.   Heart:regular rate and rhythm.  S1 and S2 without leg edema. Lung: Clear without any rhonchi or wheezes.  No dullness to percussion. Abdomin: Soft, nontender, nondistended with good bowel sounds.  No hepatosplenomegaly. Musculoskeletal: No joint deformity or effusion.  Full range of motion noted. Neurological: No deficits noted on motor, sensory and deep tendon reflex exam. Skin: No petechial rash or dryness.  Appeared moist.       Lab Results: Lab Results  Component Value Date   WBC 8.2 06/30/2021   HGB 12.2 (L) 06/30/2021   HCT 36.9 (L) 06/30/2021   MCV 92.3 06/30/2021   PLT 265 06/30/2021     Chemistry      Component Value Date/Time   NA 141 05/19/2021 0843   NA  139 04/02/2021 0908   K 4.0 05/19/2021 0843   CL 100 05/19/2021 0843   CO2 24 05/19/2021 0843   BUN 35 (H) 05/19/2021 0843   BUN 29 (H) 04/02/2021 0908   CREATININE 7.54 (HH) 05/19/2021 0843   GLU 96 03/14/2019 0000      Component Value Date/Time   CALCIUM 9.3 05/19/2021 0843   ALKPHOS 71 05/19/2021 0843   AST 19 05/19/2021 0843   ALT 14 05/19/2021 0843   BILITOT 0.5 05/19/2021 0843         Impression and Plan:    1.   Kidney cancer diagnosed in 2019.  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   He is currently receiving Pembrolizumab single agent without any major complaints.  Risks and  benefits of continuing this treatment were reviewed.  Potential complications including GI toxicity and immune mediated issues were reviewed.  The plan is to proceed with treatment and update his staging scan in January 2023.  Alternative therapy including adding axitinib or lenvatinib or combination immunotherapy will be used as a salvage.  2.  IV access: Peripheral veins continue to be in use at this time without any issues.   3.  Immune mediated complications: I continue to educate him about potential issues including pneumonitis, colitis and thyroid disease.   4.  Antiemetics: He has not experienced any nausea or vomiting.  Compazine is available to him.  5.  Renal failure: He continue to be hemodialysis dependent without any complications.   6.  Follow-up: In 6 weeks for repeat follow-up.   30  minutes were spent on this encounter.  Time was dedicated to reviewing laboratory data, disease status update and outlining future plan of care review.     Zola Button, MD 11/2/202211:58 AM

## 2021-06-30 NOTE — Progress Notes (Signed)
D/t pt being on dialysis we will run the normal saline at 10 mL/hr per MD.

## 2021-06-30 NOTE — Patient Instructions (Signed)
[facility] MEDICAL [facility]COLOG[facility]  Discharge Instructions: ?Thank you for choosing [facility] to provide your oncology and hematology care.  ? ?If you have a lab appointment with the Cancer Center, please go directly to the Cancer Center and check in at the registration area. ?  ?Wear comfortable clothing and clothing appropriate for easy access to any Portacath or PICC line.  ? ?We strive to give you quality time with your provider. You may need to reschedule your appointment if you arrive late (15 or more minutes).  Arriving late affects you and other patients whose appointments are after yours.  Also, if you miss three or more appointments without notifying the office, you may be dismissed from the clinic at the provider?s discretion.    ?  ?For prescription refill requests, have your pharmacy contact our office and allow 72 hours for refills to be completed.   ? ?Today you received the following chemotherapy and/or immunotherapy agents: Keytruda ?  ?To help prevent nausea and vomiting after your treatment, we encourage you to take your nausea medication as directed. ? ?BELOW ARE SYMPTOMS THAT SHOULD BE REPORTED IMMEDIATELY: ?*FEVER GREATER THAN 100.4 F (38 ?C) OR HIGHER ?*CHILLS OR SWEATING ?*NAUSEA AND VOMITING THAT IS NOT CONTROLLED WITH YOUR NAUSEA MEDICATION ?*UNUSUAL SHORTNESS OF BREATH ?*UNUSUAL BRUISING OR BLEEDING ?*URINARY PROBLEMS (pain or burning when urinating, or frequent urination) ?*BOWEL PROBLEMS (unusual diarrhea, constipation, pain near the anus) ?TENDERNESS IN MOUTH AND THROAT WITH OR WITHOUT PRESENCE OF ULCERS (sore throat, sores in mouth, or a toothache) ?UNUSUAL RASH, SWELLING OR PAIN  ?UNUSUAL VAGINAL DISCHARGE OR ITCHING  ? ?Items with * indicate a potential emergency and should be followed up as soon as possible or go to the Emergency Department if any problems should occur. ? ?Please show the CHEMOTHERAPY ALERT CARD or IMMUNOTHERAPY ALERT CARD at check-in to the  Emergency Department and triage nurse. ? ?Should you have questions after your visit or need to cancel or reschedule your appointment, please contact [facility]  Dept: [phone]  and follow the prompts.  Office hours are 8:00 a.m. to 4:30 p.m. Monday - Friday. Please note that voicemails left after 4:00 p.m. may not be returned until the following business day.  We are closed weekends and major holidays. You have access to a nurse at all times for urgent questions. Please call the main number to the clinic Dept: [phone] and follow the prompts. ? ? ?For any non-urgent questions, you may also contact your provider using MyChart. We now offer e-Visits for anyone 18 and older to request care online for non-urgent symptoms. For details visit [email or web address]. ?  ?Also download the MyChart app! Go to the app store, search "MyChart", open the app, select [facility], and log in with your MyChart username and password. ? ?Due to Covid, a mask is required upon entering the hospital/clinic. If you do not have a mask, one will be given to you upon arrival. For doctor visits, patients may have 1 support person aged 18 or older with them. For treatment visits, patients cannot have anyone with them due to current Covid guidelines and our immunocompromised population.  ? ?

## 2021-07-08 ENCOUNTER — Telehealth: Payer: Self-pay | Admitting: Internal Medicine

## 2021-07-08 ENCOUNTER — Encounter: Payer: Self-pay | Admitting: Oncology

## 2021-07-08 MED ORDER — MIDODRINE HCL 10 MG PO TABS: 10.0000 mg | ORAL_TABLET | Freq: Three times a day (TID) | ORAL | 3 refills | Status: DC

## 2021-07-08 NOTE — Telephone Encounter (Signed)
Spoke with patient's wife (Okay per DPR), advised her of Dr. Delphina Cahill reccomendations. New prescription has been sent in for patient's midodrine and patient's wife aware to continue current dosage schedule. She will also check with nephrology regarding patient's hypotenstions as well. Advised patient to call back to office with any issues, questions, or concerns. Patient's wife verbalized understanding.       Elouise Munroe, MD to Me     5:04 PM Ok to refill, please write script as midodrine 10 mg TID PRN, but do not change the schedule that has been written. Please discuss hypotension with nephrology since it is affected by dialysis sessions and I would like their input as well.  GA

## 2021-07-08 NOTE — Telephone Encounter (Signed)
Wife returned call, she states that patient has been having low blood pressures more recently and has been using a lot more of his midodrine. He is running out quicker due to taking it so often. She states his blood pressure has been running 75/60, and at times 70's/50's. She states when he takes the midodrine it does bump it up to into the 90's and sometimes into the 100's. She states he takes one before he leaves for dialysis, and just about every hour that he is at dialysis to be able to continue to do it. She states she does notice that he becomes in a 'daze' at times when the BP is low. She states he also started radiation and had 5 sessions of that and they are done now, so she is not sure if it has to do with his body being so worn down, but they are running out of medication and requested either an increased amount to be sent in so he can have that. I did advise I would make Dr.Acharya aware of the bp issue so she can give her recommendations and we would call back with what she recommends to do. Patient wife thankful for call back.

## 2021-07-08 NOTE — Progress Notes (Signed)
                                                                                                                                                             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  Intent: Curative  Radiation Treatment Dates:  06/14/2021 through 06/24/2021 SBRT Treatment Site Technique Total Dose (Gy) Dose per Fx (Gy) Completed Fx Beam Energies  Lung, Right: Lung_Rt IMRT 60/60 12 5/5 6XFFF  Lung, Left: Lung_Lt IMRT 60/60 12 5/5 6XFFF   Narrative: The patient tolerated radiation therapy relatively well. He continued to have low blood pressures due to volume shifts from hemodialysis.   Plan: The patient will receive a call in about one month from the radiation oncology department. She will continue follow up with Dr. Alen Blew as well.   ________________________________________________    Carola Rhine, [facility]

## 2021-07-08 NOTE — Telephone Encounter (Signed)
  Pt c/o medication issue:  1. Name of Medication: midodrine (PROAMATINE) 10 MG tablet  2. How are you currently taking this medication (dosage and times per day)? Take 1 tablet (10 mg total) by mouth every Tuesday, Thursday, and Saturday at 6 PM.Patient taking differently: Take 10 mg by mouth every Tuesday, Thursday, and Saturday at 6 PM. In the evening 10 mg  on Mon. Wed and Friday  Take 20 mg on Dialysis days Tues, thurs and Saturday. 10 mg when he gets there and 10 mg half way through  3. Are you having a reaction (difficulty breathing--STAT)?   4. What is your medication issue? Pt's wife said, pt been having low BP lately and been taking 2-4 of this meds a day, especially during his dialysis, she wanted to know if Dr, Margaretann Loveless would like to increase it so that pt wont always ran out of meds

## 2021-07-08 NOTE — Telephone Encounter (Signed)
Called wife back to discuss.  Left call back number.

## 2021-07-09 ENCOUNTER — Telehealth: Payer: Self-pay | Admitting: Internal Medicine

## 2021-07-09 MED ORDER — MIDODRINE HCL 10 MG PO TABS: 10.0000 mg | ORAL_TABLET | Freq: Three times a day (TID) | ORAL | 3 refills | Status: DC | PRN

## 2021-07-09 NOTE — Telephone Encounter (Signed)
could you provide some clarity on how this pt should be taking his midodrine (PROAMATINE) 10 MG tablet... I have [place] on the line. We've both read through the instructions and they are a bit confusing... please advise.

## 2021-07-09 NOTE — Telephone Encounter (Signed)
Returned call to [employer]- spoke with pharmacist and instructions were clarified. Prescription updated per Dr. Delphina Cahill recommendations.   Elouise Munroe, MD to Me     5:04 PM Ok to refill, please write script as midodrine 10 mg TID PRN, but do not change the schedule that has been written. Please discuss hypotension with nephrology since it is affected by dialysis sessions and I would like their input as well.  GA     Advised pharmacist to call back with any issues.

## 2021-07-19 ENCOUNTER — Ambulatory Visit
Admission: RE | Admit: 2021-07-19 | Discharge: 2021-07-19 | Disposition: A | Discharge status: Home / Self Care | Payer: Medicare Other | Source: Ambulatory Visit | Attending: Radiation Oncology | Admitting: Radiation Oncology

## 2021-07-19 DIAGNOSIS — C641 Malignant neoplasm of right kidney, except renal pelvis: Secondary | ICD-10-CM

## 2021-07-19 DIAGNOSIS — R918 Other nonspecific abnormal finding of lung field: Secondary | ICD-10-CM

## 2021-07-20 NOTE — Progress Notes (Signed)
  Radiation Oncology         [phone]) [phone] ________________________________  Name: George Lewis. MRN: [id number]  Date of Service: 07/19/2021  DOB: Apr 02, 1956  Post Treatment Telephone Note  Diagnosis:   Progressive Stage IV renal cell carcinoma with pulmonary disease.  Interval Since Last Radiation:  4 weeks   06/14/2021 through 06/24/2021 SBRT Treatment Site Technique Total Dose (Gy) Dose per Fx (Gy) Completed Fx Beam Energies  Lung, Right: Lung_Rt IMRT 60/60 12 5/5 6XFFF  Lung, Left: Lung_Lt IMRT 60/60 12 5/5 6XFFF    Narrative:  The patient was contacted today for routine follow-up. During treatment he did very well with radiotherapy and did not have significant desquamation.  Impression/Plan: 1. Progressive Stage IV renal cell carcinoma with pulmonary disease. I was unable to reach the patient but left a voicemail and on the message, I  discussed that we would be happy to continue to follow him as needed, but he will also continue to follow up with Dr. Alen Blew in medical oncology.      Carola Rhine, PAC

## 2021-07-27 ENCOUNTER — Other Ambulatory Visit: Payer: Self-pay

## 2021-07-27 ENCOUNTER — Ambulatory Visit (INDEPENDENT_AMBULATORY_CARE_PROVIDER_SITE_OTHER): Payer: Medicare Other

## 2021-07-27 DIAGNOSIS — Z5181 Encounter for therapeutic drug level monitoring: Secondary | ICD-10-CM

## 2021-07-27 DIAGNOSIS — I480 Paroxysmal atrial fibrillation: Secondary | ICD-10-CM

## 2021-07-27 DIAGNOSIS — Z7901 Long term (current) use of anticoagulants: Secondary | ICD-10-CM | POA: Diagnosis not present

## 2021-07-27 LAB — POCT INR: INR: 6.2 — AB (ref 2.0–3.0)

## 2021-07-27 NOTE — Patient Instructions (Signed)
Description   Hold today's dose, tomorrow's dose and Thursday's dose and then continue taking warfarin 1 tablet (3mg ) daily except 1.5 (4.5mg )  tablet each Monday and Friday.  Repeat INR in 1 week.

## 2021-07-28 ENCOUNTER — Other Ambulatory Visit: Payer: Self-pay | Admitting: Physician Assistant

## 2021-08-02 ENCOUNTER — Other Ambulatory Visit: Payer: Self-pay

## 2021-08-02 DIAGNOSIS — Z794 Long term (current) use of insulin: Secondary | ICD-10-CM

## 2021-08-02 DIAGNOSIS — N186 End stage renal disease: Secondary | ICD-10-CM

## 2021-08-02 DIAGNOSIS — E1122 Type 2 diabetes mellitus with diabetic chronic kidney disease: Secondary | ICD-10-CM

## 2021-08-02 DIAGNOSIS — Z992 Dependence on renal dialysis: Secondary | ICD-10-CM

## 2021-08-02 MED ORDER — ACCU-CHEK GUIDE VI STRP: ORAL_STRIP | 0 refills | Status: AC

## 2021-08-03 ENCOUNTER — Ambulatory Visit (INDEPENDENT_AMBULATORY_CARE_PROVIDER_SITE_OTHER): Payer: Medicare Other

## 2021-08-03 ENCOUNTER — Other Ambulatory Visit: Payer: Self-pay

## 2021-08-03 DIAGNOSIS — Z5181 Encounter for therapeutic drug level monitoring: Secondary | ICD-10-CM

## 2021-08-03 DIAGNOSIS — I480 Paroxysmal atrial fibrillation: Secondary | ICD-10-CM

## 2021-08-03 DIAGNOSIS — Z7901 Long term (current) use of anticoagulants: Secondary | ICD-10-CM

## 2021-08-03 LAB — POCT INR: INR: 3.1 — AB (ref 2.0–3.0)

## 2021-08-03 NOTE — Patient Instructions (Signed)
Description   Only take 0.5 tablet today and then continue taking warfarin 1 tablet (3mg ) daily except 1.5 (4.5mg )  tablet each Monday and Friday.  Repeat INR in 2 weeks.

## 2021-08-11 ENCOUNTER — Other Ambulatory Visit: Payer: Self-pay

## 2021-08-11 ENCOUNTER — Inpatient Hospital Stay: Payer: Medicare Other

## 2021-08-11 ENCOUNTER — Inpatient Hospital Stay: Payer: Medicare Other | Attending: Oncology

## 2021-08-11 ENCOUNTER — Inpatient Hospital Stay (HOSPITAL_BASED_OUTPATIENT_CLINIC_OR_DEPARTMENT_OTHER): Payer: Medicare Other | Admitting: Oncology

## 2021-08-11 ENCOUNTER — Telehealth: Payer: Self-pay

## 2021-08-11 VITALS — BP 97/62 | HR 124 | Temp 97.5°F | Resp 17 | Wt 292.2 lb

## 2021-08-11 VITALS — HR 102

## 2021-08-11 DIAGNOSIS — Z992 Dependence on renal dialysis: Secondary | ICD-10-CM | POA: Insufficient documentation

## 2021-08-11 DIAGNOSIS — Z905 Acquired absence of kidney: Secondary | ICD-10-CM | POA: Insufficient documentation

## 2021-08-11 DIAGNOSIS — R000 Tachycardia, unspecified: Secondary | ICD-10-CM | POA: Diagnosis not present

## 2021-08-11 DIAGNOSIS — C7801 Secondary malignant neoplasm of right lung: Secondary | ICD-10-CM | POA: Diagnosis present

## 2021-08-11 DIAGNOSIS — C649 Malignant neoplasm of unspecified kidney, except renal pelvis: Secondary | ICD-10-CM | POA: Diagnosis present

## 2021-08-11 DIAGNOSIS — Z923 Personal history of irradiation: Secondary | ICD-10-CM | POA: Diagnosis not present

## 2021-08-11 DIAGNOSIS — N19 Unspecified kidney failure: Secondary | ICD-10-CM | POA: Diagnosis not present

## 2021-08-11 DIAGNOSIS — Z79899 Other long term (current) drug therapy: Secondary | ICD-10-CM | POA: Diagnosis not present

## 2021-08-11 DIAGNOSIS — C7802 Secondary malignant neoplasm of left lung: Secondary | ICD-10-CM | POA: Diagnosis not present

## 2021-08-11 DIAGNOSIS — C641 Malignant neoplasm of right kidney, except renal pelvis: Secondary | ICD-10-CM

## 2021-08-11 DIAGNOSIS — Z5112 Encounter for antineoplastic immunotherapy: Secondary | ICD-10-CM | POA: Insufficient documentation

## 2021-08-11 DIAGNOSIS — Z7901 Long term (current) use of anticoagulants: Secondary | ICD-10-CM | POA: Diagnosis not present

## 2021-08-11 DIAGNOSIS — Z794 Long term (current) use of insulin: Secondary | ICD-10-CM | POA: Insufficient documentation

## 2021-08-11 DIAGNOSIS — E032 Hypothyroidism due to medicaments and other exogenous substances: Secondary | ICD-10-CM

## 2021-08-11 LAB — CBC WITH DIFFERENTIAL (CANCER CENTER ONLY)
Abs Immature Granulocytes: 0.22 10*3/uL — ABNORMAL HIGH (ref 0.00–0.07)
Basophils Absolute: 0.1 10*3/uL (ref 0.0–0.1)
Basophils Relative: 1 %
Eosinophils Absolute: 0.2 10*3/uL (ref 0.0–0.5)
Eosinophils Relative: 2 %
HCT: 34.1 % — ABNORMAL LOW (ref 39.0–52.0)
Hemoglobin: 11.3 g/dL — ABNORMAL LOW (ref 13.0–17.0)
Immature Granulocytes: 2 %
Lymphocytes Relative: 13 %
Lymphs Abs: 1.2 10*3/uL (ref 0.7–4.0)
MCH: 31 pg (ref 26.0–34.0)
MCHC: 33.1 g/dL (ref 30.0–36.0)
MCV: 93.7 fL (ref 80.0–100.0)
Monocytes Absolute: 0.8 10*3/uL (ref 0.1–1.0)
Monocytes Relative: 9 %
Neutro Abs: 6.5 10*3/uL (ref 1.7–7.7)
Neutrophils Relative %: 73 %
Platelet Count: 276 10*3/uL (ref 150–400)
RBC: 3.64 MIL/uL — ABNORMAL LOW (ref 4.22–5.81)
RDW: 15.1 % (ref 11.5–15.5)
WBC Count: 9 10*3/uL (ref 4.0–10.5)
nRBC: 0 % (ref 0.0–0.2)

## 2021-08-11 LAB — TSH: TSH: 1.836 u[IU]/mL (ref 0.320–4.118)

## 2021-08-11 LAB — CMP (CANCER CENTER ONLY)
ALT: 15 U/L (ref 0–44)
AST: 19 U/L (ref 15–41)
Albumin: 3.8 g/dL (ref 3.5–5.0)
Alkaline Phosphatase: 54 U/L (ref 38–126)
Anion gap: 18 — ABNORMAL HIGH (ref 5–15)
BUN: 43 mg/dL — ABNORMAL HIGH (ref 8–23)
CO2: 22 mmol/L (ref 22–32)
Calcium: 9.6 mg/dL (ref 8.9–10.3)
Chloride: 99 mmol/L (ref 98–111)
Creatinine: 9.11 mg/dL (ref 0.61–1.24)
GFR, Estimated: 6 mL/min — ABNORMAL LOW (ref >60)
Glucose, Bld: 119 mg/dL — ABNORMAL HIGH (ref 70–99)
Potassium: 3.9 mmol/L (ref 3.5–5.1)
Sodium: 139 mmol/L (ref 135–145)
Total Bilirubin: 0.5 mg/dL (ref 0.3–1.2)
Total Protein: 8.2 g/dL — ABNORMAL HIGH (ref 6.5–8.1)

## 2021-08-11 MED ORDER — SODIUM CHLORIDE 0.9 % IV SOLN
400.0000 mg | Freq: Once | INTRAVENOUS | Status: AC
  Administered 2021-08-11: 400 mg via INTRAVENOUS
  Filled 2021-08-11: qty 16

## 2021-08-11 MED ORDER — SODIUM CHLORIDE 0.9 % IV SOLN: Freq: Once | INTRAVENOUS | Status: AC

## 2021-08-11 NOTE — Telephone Encounter (Signed)
CRITICAL VALUE STICKER  CRITICAL VALUE: Creatinine = 9.11  RECEIVER (on-site recipient of call): Yetta Glassman, CMA  DATE & TIME NOTIFIED: 08/11/21 at 10:37am  MESSENGER (representative from lab): Lelan Pons  MD NOTIFIED: Alen Blew  TIME OF NOTIFICATION: 08/11/21 at 10:40am  RESPONSE: Notification given to Dr. Alen Blew for follow-up with pt.

## 2021-08-11 NOTE — Progress Notes (Signed)
Hematology and Oncology Follow Up Visit  George Lewis [id number] 08-Apr-1956 65 y.o. 08/11/2021 9:46 AM [name], [name], PA-C   Principle Diagnosis: 65 year old man with kidney cancer diagnosed in 2019.  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    Prior Therapy:  He underwent laparoscopic radical nephrectomy completed on May 16, 2018 under the care of Dr. Gloriann Loan.  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  Histological grade 4.   He underwent robotic assisted left upper lobe wedge resection completed by Dr. Roxan Hockey on October 16, 2020.  The final pathology showed clear-cell renal cell carcinoma with negative margins.    He is status post radiation therapy to the left upper lobe and the right lower lobe pulmonary nodules.  October 2022.   Current therapy: Pembrolizumab 400 mg every 6 weeks started in Jan 13, 2021.  He is here for cycle 5 of therapy.  Interim History: [name] is here for repeat evaluation.  Since the last visit, he reports that no major changes in his health.  He continues to tolerate Pembrolizumab without any concerns.  He denies any nausea, vomiting or abdominal pain.  He denies any shortness of breath or difficulty breathing.  He denies any palpitations.  No residual complication related to radiation noted at this time.     Medications: Reviewed without changes. Current Outpatient Medications  Medication Sig Dispense Refill   Accu-Chek FastClix Lancets MISC Use to check fasting blood sugar and 2 hrs after largest meal. 100 each 0   acetaminophen (TYLENOL) 500 MG tablet Take 1,000 mg by mouth every 8 (eight) hours as needed for mild pain or headache.     allopurinol (ZYLOPRIM) 100 MG tablet Take 1 tablet (100 mg total) by mouth every evening. 30 tablet  0   amiodarone (PACERONE) 200 MG tablet Take 1 tablet (200 mg total) by mouth daily. (Patient taking differently: Take 200 mg by mouth every evening.) 90 tablet 3   atorvastatin (LIPITOR) 40 MG tablet Take 1 tablet (40 mg total) by mouth every evening.     blood glucose meter kit and supplies Dispense based on patient and insurance preference. Use up to four times daily as directed. (FOR ICD-10 E10.9, E11.9). 1 each 0   diphenhydramine-acetaminophen (TYLENOL PM) 25-500 MG TABS tablet Take 2 tablets by mouth at bedtime as needed (sleep/headache).     ferric citrate (AURYXIA) 1 GM 210 MG(Fe) tablet Take 1 tablet (210 mg total) by mouth daily with supper. 270 tablet    fluticasone (FLONASE) 50 MCG/ACT nasal spray Place 1-2 sprays into both nostrils daily as needed for allergies or rhinitis.     gabapentin (NEURONTIN) 100 MG capsule Take 1 capsule (100 mg total) by mouth 2 (two) times daily. Take 1 PO at night for 5 days then go to twice daily 60 capsule 2   glucose blood (ACCU-CHEK GUIDE) test strip USE 1 STRIP TO CHECK FASTING BLOOD SUGAR AND 2 HOURS AFTER LARGEST MEAL 100 each 0   insulin glargine (LANTUS) 100 UNIT/ML injection Inject into the skin daily as needed (low blood glucose 180). Unknow dose to give if needed     lidocaine-prilocaine (EMLA) cream Apply topically.     loratadine (CLARITIN) 10 MG tablet Take 10 mg by mouth every evening.      midodrine (PROAMATINE) 10 MG tablet Take 1 tablet (10  mg total) by mouth 3 (three) times daily as needed (for hypotension/dialysis). AS ordered. Take In the evening 10 mg  on Mon. Wed and Friday Take 20 mg on Dialysis days Tues, thurs and Saturday. 10 mg when he gets there and 10 mg half way through. 90 tablet 3   nitroGLYCERIN (NITROSTAT) 0.4 MG SL tablet Place 1 tablet (0.4 mg total) under the tongue every 5 (five) minutes as needed for chest pain. 14 tablet 12   omeprazole (PRILOSEC OTC) 20 MG tablet Take 40 mg by mouth daily before breakfast.       pembrolizumab (KEYTRUDA) 100 MG/4ML SOLN Inject into the vein.     prochlorperazine (COMPAZINE) 10 MG tablet Take 1 tablet (10 mg total) by mouth every 6 (six) hours as needed for nausea or vomiting. 30 tablet 0   Sennosides (SENOKOT EXTRA STRENGTH) 17.2 MG TABS Take 34.4 mg by mouth at bedtime.     traMADol (ULTRAM) 50 MG tablet Take 50 mg by mouth every 12 (twelve) hours as needed (Pain).     warfarin (COUMADIN) 3 MG tablet TAKE 1 TO 2 TABLETS BY MOUTH DAILY AS DIRECTED BY  THE  COUMADIN  CLINIC 60 tablet 0   No current facility-administered medications for this visit.     Allergies:  Allergies  Allergen Reactions   Penicillins Rash and Other (See Comments)    Has patient had a PCN reaction causing immediate rash, facial/tongue/throat swelling, SOB or lightheadedness with hypotension: yes Has patient had a PCN reaction causing severe rash involving mucus membranes or skin necrosis: no Has patient had a PCN reaction that required hospitalization: no Has patient had a PCN reaction occurring within the last 10 years: no If all of the above answers are "NO", then may proceed with Cephalosporin use.       Physical Exam:   Blood pressure 97/62, pulse (!) 124, temperature (!) 97.5 F (36.4 C), temperature source Temporal, resp. rate 17, weight 292 lb 3.2 oz (132.5 kg), SpO2 100 %.    ECOG: 1    General appearance: Alert, awake without any distress. Head: Atraumatic without abnormalities Oropharynx: Without any thrush or ulcers. Eyes: No scleral icterus. Lymph nodes: No lymphadenopathy noted in the cervical, supraclavicular, or axillary nodes Heart:regular rate and rhythm, without any murmurs or gallops.   Lung: Clear to auscultation without any rhonchi, wheezes or dullness to percussion. Abdomin: Soft, nontender without any shifting dullness or ascites. Musculoskeletal: No clubbing or cyanosis. Neurological: No motor or sensory deficits. Skin: No rashes or  lesions.        Lab Results: Lab Results  Component Value Date   WBC 8.2 06/30/2021   HGB 12.2 (L) 06/30/2021   HCT 36.9 (L) 06/30/2021   MCV 92.3 06/30/2021   PLT 265 06/30/2021     Chemistry      Component Value Date/Time   NA 138 06/30/2021 1137   NA 139 04/02/2021 0908   K 4.4 06/30/2021 1137   CL 97 (L) 06/30/2021 1137   CO2 22 06/30/2021 1137   BUN 44 (H) 06/30/2021 1137   BUN 29 (H) 04/02/2021 0908   CREATININE 9.98 (HH) 06/30/2021 1137   GLU 96 03/14/2019 0000      Component Value Date/Time   CALCIUM 9.1 06/30/2021 1137   ALKPHOS 69 06/30/2021 1137   AST 17 06/30/2021 1137   ALT 15 06/30/2021 1137   BILITOT 0.5 06/30/2021 1137         Impression and Plan:  1.  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   The natural course of his disease was reviewed at this time and treatment choices were updated.  He has tolerated therapy well without any issues and the plan is to continue with the same treatment and repeat imaging studies in January 2023.  Adding lenvatinib could be considered at that time if he has disease progression.  He is agreeable with this plan.  2.  IV access: No issues reported with peripheral veins at this time.   3.  Immune mediated complications: He has not experienced any at this time.  Issues such as hepatitis, colitis and thyroid disease were reviewed.     4.  Antiemetics: Compazine is available to him without any nausea or vomiting.  5.  Renal failure: He is currently on hemodialysis without any issues.  6.  Tachycardia: Chronic in nature and asymptomatic.  He continues to follow with cardiology without any need for intervention.   6.  Follow-up: He will return in 6 weeks for a follow-up evaluation and repeat imaging studies.   30  minutes were dedicated to this visit.  The time was spent on reviewing disease status, treatment choices and future plan of care  discussion.     Zola Button, MD 12/14/20229:46 AM

## 2021-08-11 NOTE — Patient Instructions (Signed)
[facility]  Discharge Instructions: ?Thank you for choosing [facility] to provide your oncology and hematology care.  ? ?If you have a lab appointment with the Cancer Center, please go directly to the Cancer Center and check in at the registration area. ?  ?Wear comfortable clothing and clothing appropriate for easy access to any Portacath or PICC line.  ? ?We strive to give you quality time with your provider. You may need to reschedule your appointment if you arrive late (15 or more minutes).  Arriving late affects you and other patients whose appointments are after yours.  Also, if you miss three or more appointments without notifying the office, you may be dismissed from the clinic at the provider?s discretion.    ?  ?For prescription refill requests, have your pharmacy contact our office and allow 72 hours for refills to be completed.   ? ?Today you received the following chemotherapy and/or immunotherapy agents: Keytruda ?  ?To help prevent nausea and vomiting after your treatment, we encourage you to take your nausea medication as directed. ? ?BELOW ARE SYMPTOMS THAT SHOULD BE REPORTED IMMEDIATELY: ?*FEVER GREATER THAN 100.4 F (38 ?C) OR HIGHER ?*CHILLS OR SWEATING ?*NAUSEA AND VOMITING THAT IS NOT CONTROLLED WITH YOUR NAUSEA MEDICATION ?*UNUSUAL SHORTNESS OF BREATH ?*UNUSUAL BRUISING OR BLEEDING ?*URINARY PROBLEMS (pain or burning when urinating, or frequent urination) ?*BOWEL PROBLEMS (unusual diarrhea, constipation, pain near the anus) ?TENDERNESS IN MOUTH AND THROAT WITH OR WITHOUT PRESENCE OF ULCERS (sore throat, sores in mouth, or a toothache) ?UNUSUAL RASH, SWELLING OR PAIN  ?UNUSUAL VAGINAL DISCHARGE OR ITCHING  ? ?Items with * indicate a potential emergency and should be followed up as soon as possible or go to the Emergency Department if any problems should occur. ? ?Please show the CHEMOTHERAPY ALERT CARD or IMMUNOTHERAPY ALERT CARD at check-in to the  Emergency Department and triage nurse. ? ?Should you have questions after your visit or need to cancel or reschedule your appointment, please contact [facility]  Dept: [phone]  and follow the prompts.  Office hours are 8:00 a.m. to 4:30 p.m. Monday - Friday. Please note that voicemails left after 4:00 p.m. may not be returned until the following business day.  We are closed weekends and major holidays. You have access to a nurse at all times for urgent questions. Please call the main number to the clinic Dept: [phone] and follow the prompts. ? ? ?For any non-urgent questions, you may also contact your provider using MyChart. We now offer e-Visits for anyone 18 and older to request care online for non-urgent symptoms. For details visit [email or web address]. ?  ?Also download the MyChart app! Go to the app store, search "MyChart", open the app, select , and log in with your MyChart username and password. ? ?Due to Covid, a mask is required upon entering the hospital/clinic. If you do not have a mask, one will be given to you upon arrival. For doctor visits, patients may have 1 support person aged 18 or older with them. For treatment visits, patients cannot have anyone with them due to current Covid guidelines and our immunocompromised population.  ? ?

## 2021-08-12 ENCOUNTER — Other Ambulatory Visit: Payer: Self-pay

## 2021-08-12 ENCOUNTER — Telehealth: Payer: Self-pay | Admitting: Internal Medicine

## 2021-08-12 DIAGNOSIS — I480 Paroxysmal atrial fibrillation: Secondary | ICD-10-CM

## 2021-08-12 MED ORDER — WARFARIN SODIUM 3 MG PO TABS: ORAL_TABLET | ORAL | 5 refills | Status: DC

## 2021-08-12 NOTE — Telephone Encounter (Signed)
Pt c/o medication issue:  1. Name of Medication: warfarin (COUMADIN) 3 MG tablet  2. How are you currently taking this medication (dosage and times per day)? TAKE 1 TO 2 TABLETS BY MOUTH DAILY AS DIRECTED BY THE COUMADIN CLINIC  3. Are you having a reaction (difficulty breathing--STAT)? No   4. What is your medication issue? Patient is currently out of refills and need a new prescription filled

## 2021-08-15 ENCOUNTER — Other Ambulatory Visit: Payer: Self-pay

## 2021-08-17 ENCOUNTER — Other Ambulatory Visit: Payer: Self-pay

## 2021-08-17 ENCOUNTER — Ambulatory Visit (INDEPENDENT_AMBULATORY_CARE_PROVIDER_SITE_OTHER): Payer: Medicare Other

## 2021-08-17 DIAGNOSIS — Z5181 Encounter for therapeutic drug level monitoring: Secondary | ICD-10-CM | POA: Diagnosis not present

## 2021-08-17 DIAGNOSIS — I480 Paroxysmal atrial fibrillation: Secondary | ICD-10-CM

## 2021-08-17 DIAGNOSIS — Z7901 Long term (current) use of anticoagulants: Secondary | ICD-10-CM

## 2021-08-17 LAB — POCT INR: INR: 4.3 — AB (ref 2.0–3.0)

## 2021-08-17 NOTE — Patient Instructions (Signed)
Description   Hold today's dose and then START taking 1 tablet daily. Repeat INR in 1 week. [facility] [phone]

## 2021-08-18 ENCOUNTER — Telehealth: Payer: Self-pay | Admitting: *Deleted

## 2021-08-18 NOTE — Telephone Encounter (Signed)
PC to patient, spoke with his wife Vaughan Basta, informed her the patient's CT has been approved by insurance & may be scheduled at any time.  Central Scheduling phone number given, she verbalizes understanding.

## 2021-08-24 ENCOUNTER — Other Ambulatory Visit: Payer: Self-pay

## 2021-08-24 ENCOUNTER — Ambulatory Visit (INDEPENDENT_AMBULATORY_CARE_PROVIDER_SITE_OTHER): Payer: Medicare Other

## 2021-08-24 DIAGNOSIS — I480 Paroxysmal atrial fibrillation: Secondary | ICD-10-CM

## 2021-08-24 DIAGNOSIS — Z7901 Long term (current) use of anticoagulants: Secondary | ICD-10-CM | POA: Diagnosis not present

## 2021-08-24 DIAGNOSIS — Z5181 Encounter for therapeutic drug level monitoring: Secondary | ICD-10-CM

## 2021-08-24 LAB — POCT INR: INR: 3.9 — AB (ref 2.0–3.0)

## 2021-08-24 NOTE — Patient Instructions (Signed)
Description   Hold today's dose and then START taking 1 tablet daily except 0.5 tablet on Mondays and Fridays. Repeat INR in 1 week. [facility]in Clinic [phone]

## 2021-09-07 ENCOUNTER — Other Ambulatory Visit: Payer: Self-pay

## 2021-09-07 ENCOUNTER — Ambulatory Visit (INDEPENDENT_AMBULATORY_CARE_PROVIDER_SITE_OTHER): Payer: Medicare Other

## 2021-09-07 DIAGNOSIS — Z5181 Encounter for therapeutic drug level monitoring: Secondary | ICD-10-CM

## 2021-09-07 DIAGNOSIS — I480 Paroxysmal atrial fibrillation: Secondary | ICD-10-CM | POA: Diagnosis not present

## 2021-09-07 DIAGNOSIS — Z7901 Long term (current) use of anticoagulants: Secondary | ICD-10-CM | POA: Diagnosis not present

## 2021-09-07 LAB — POCT INR: INR: 3 (ref 2.0–3.0)

## 2021-09-07 NOTE — Patient Instructions (Signed)
Description   Continue taking 1 tablet daily except 0.5 tablet on Mondays and Fridays. Repeat INR in 3 weeks. [facility]madin Clinic [phone]

## 2021-09-17 ENCOUNTER — Ambulatory Visit (HOSPITAL_COMMUNITY)
Admission: RE | Admit: 2021-09-17 | Discharge: 2021-09-17 | Disposition: A | Discharge status: Home / Self Care | Payer: Medicare Other | Source: Ambulatory Visit | Attending: Oncology | Admitting: Oncology

## 2021-09-17 ENCOUNTER — Other Ambulatory Visit (HOSPITAL_COMMUNITY): Payer: Medicare Other

## 2021-09-17 DIAGNOSIS — C641 Malignant neoplasm of right kidney, except renal pelvis: Secondary | ICD-10-CM | POA: Insufficient documentation

## 2021-09-22 ENCOUNTER — Other Ambulatory Visit: Payer: Self-pay

## 2021-09-22 ENCOUNTER — Telehealth: Payer: Self-pay | Admitting: *Deleted

## 2021-09-22 ENCOUNTER — Inpatient Hospital Stay (HOSPITAL_BASED_OUTPATIENT_CLINIC_OR_DEPARTMENT_OTHER): Payer: Medicare Other | Admitting: Oncology

## 2021-09-22 ENCOUNTER — Inpatient Hospital Stay: Payer: Medicare Other

## 2021-09-22 ENCOUNTER — Inpatient Hospital Stay: Payer: Medicare Other | Attending: Oncology

## 2021-09-22 VITALS — HR 112

## 2021-09-22 VITALS — BP 116/68 | HR 123 | Temp 97.7°F | Resp 19 | Ht 73.0 in | Wt 292.4 lb

## 2021-09-22 DIAGNOSIS — C649 Malignant neoplasm of unspecified kidney, except renal pelvis: Secondary | ICD-10-CM | POA: Insufficient documentation

## 2021-09-22 DIAGNOSIS — C7800 Secondary malignant neoplasm of unspecified lung: Secondary | ICD-10-CM | POA: Insufficient documentation

## 2021-09-22 DIAGNOSIS — C641 Malignant neoplasm of right kidney, except renal pelvis: Secondary | ICD-10-CM

## 2021-09-22 DIAGNOSIS — Z992 Dependence on renal dialysis: Secondary | ICD-10-CM | POA: Diagnosis not present

## 2021-09-22 DIAGNOSIS — I700 Atherosclerosis of aorta: Secondary | ICD-10-CM | POA: Diagnosis not present

## 2021-09-22 DIAGNOSIS — Z79899 Other long term (current) drug therapy: Secondary | ICD-10-CM | POA: Diagnosis not present

## 2021-09-22 DIAGNOSIS — E032 Hypothyroidism due to medicaments and other exogenous substances: Secondary | ICD-10-CM

## 2021-09-22 DIAGNOSIS — R000 Tachycardia, unspecified: Secondary | ICD-10-CM | POA: Diagnosis not present

## 2021-09-22 DIAGNOSIS — C7801 Secondary malignant neoplasm of right lung: Secondary | ICD-10-CM | POA: Insufficient documentation

## 2021-09-22 DIAGNOSIS — R0602 Shortness of breath: Secondary | ICD-10-CM | POA: Insufficient documentation

## 2021-09-22 DIAGNOSIS — K760 Fatty (change of) liver, not elsewhere classified: Secondary | ICD-10-CM | POA: Insufficient documentation

## 2021-09-22 DIAGNOSIS — Z5112 Encounter for antineoplastic immunotherapy: Secondary | ICD-10-CM | POA: Diagnosis not present

## 2021-09-22 LAB — CMP (CANCER CENTER ONLY)
ALT: 12 U/L (ref 0–44)
AST: 16 U/L (ref 15–41)
Albumin: 4.4 g/dL (ref 3.5–5.0)
Alkaline Phosphatase: 47 U/L (ref 38–126)
Anion gap: 17 — ABNORMAL HIGH (ref 5–15)
BUN: 42 mg/dL — ABNORMAL HIGH (ref 8–23)
CO2: 25 mmol/L (ref 22–32)
Calcium: 9.6 mg/dL (ref 8.9–10.3)
Chloride: 96 mmol/L — ABNORMAL LOW (ref 98–111)
Creatinine: 8.7 mg/dL (ref 0.61–1.24)
GFR, Estimated: 6 mL/min — ABNORMAL LOW (ref >60)
Glucose, Bld: 120 mg/dL — ABNORMAL HIGH (ref 70–99)
Potassium: 4.1 mmol/L (ref 3.5–5.1)
Sodium: 138 mmol/L (ref 135–145)
Total Bilirubin: 0.4 mg/dL (ref 0.3–1.2)
Total Protein: 8.2 g/dL — ABNORMAL HIGH (ref 6.5–8.1)

## 2021-09-22 LAB — CBC WITH DIFFERENTIAL (CANCER CENTER ONLY)
Abs Immature Granulocytes: 0.36 10*3/uL — ABNORMAL HIGH (ref 0.00–0.07)
Basophils Absolute: 0.1 10*3/uL (ref 0.0–0.1)
Basophils Relative: 1 %
Eosinophils Absolute: 0.3 10*3/uL (ref 0.0–0.5)
Eosinophils Relative: 2 %
HCT: 32.6 % — ABNORMAL LOW (ref 39.0–52.0)
Hemoglobin: 11.2 g/dL — ABNORMAL LOW (ref 13.0–17.0)
Immature Granulocytes: 3 %
Lymphocytes Relative: 12 %
Lymphs Abs: 1.5 10*3/uL (ref 0.7–4.0)
MCH: 32.5 pg (ref 26.0–34.0)
MCHC: 34.4 g/dL (ref 30.0–36.0)
MCV: 94.5 fL (ref 80.0–100.0)
Monocytes Absolute: 1.1 10*3/uL — ABNORMAL HIGH (ref 0.1–1.0)
Monocytes Relative: 9 %
Neutro Abs: 9.3 10*3/uL — ABNORMAL HIGH (ref 1.7–7.7)
Neutrophils Relative %: 73 %
Platelet Count: 308 10*3/uL (ref 150–400)
RBC: 3.45 MIL/uL — ABNORMAL LOW (ref 4.22–5.81)
RDW: 15.3 % (ref 11.5–15.5)
WBC Count: 12.6 10*3/uL — ABNORMAL HIGH (ref 4.0–10.5)
nRBC: 0 % (ref 0.0–0.2)

## 2021-09-22 LAB — TSH: TSH: 1.177 u[IU]/mL (ref 0.320–4.118)

## 2021-09-22 MED ORDER — SODIUM CHLORIDE 0.9 % IV SOLN: Freq: Once | INTRAVENOUS | Status: AC

## 2021-09-22 MED ORDER — SODIUM CHLORIDE 0.9% FLUSH: 10.0000 mL | INTRAVENOUS | Status: DC | PRN

## 2021-09-22 MED ORDER — HEPARIN SOD (PORK) LOCK FLUSH 100 UNIT/ML IV SOLN: 500.0000 [IU] | Freq: Once | INTRAVENOUS | Status: DC | PRN

## 2021-09-22 MED ORDER — SODIUM CHLORIDE 0.9 % IV SOLN
400.0000 mg | Freq: Once | INTRAVENOUS | Status: AC
  Administered 2021-09-22: 400 mg via INTRAVENOUS
  Filled 2021-09-22: qty 16

## 2021-09-22 NOTE — Progress Notes (Signed)
Hematology and Oncology Follow Up Visit  George Lewis [id number] [birth year] 66 y.o. 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    Prior Therapy:  He underwent laparoscopic radical nephrectomy completed on May 16, 2018 under the care of Dr. Gloriann Loan.  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  Histological grade 4.   He underwent robotic assisted left upper lobe wedge resection completed by Dr. Roxan Hockey on October 16, 2020.  The final pathology showed clear-cell renal cell carcinoma with negative margins.    He is status post radiation therapy to the left upper lobe and the right lower lobe pulmonary nodules.  October 2022.   Current therapy: Pembrolizumab 400 mg every 6 weeks started in Jan 13, 2021.  He is here for cycle 6 of therapy.  Interim History: [name] presents today for a follow-up visit.  Since the last visit, he reports no major changes in his health.  He does report some mild dyspnea on exertion but does not report any chest pain, fever or cough.  He denies any wheezing or hemoptysis.  He continues to attempt activities of daily living without any decline.     Medications: Updated on review. Current Outpatient Medications  Medication Sig Dispense Refill   Accu-Chek FastClix Lancets MISC Use to check fasting blood sugar and 2 hrs after largest meal. 100 each 0   acetaminophen (TYLENOL) 500 MG tablet Take 1,000 mg by mouth every 8 (eight) hours as needed for mild pain or headache.     allopurinol (ZYLOPRIM) 100 MG tablet Take 1 tablet (100 mg total) by mouth every evening. 30 tablet 0   amiodarone (PACERONE) 200 MG tablet Take 1 tablet (200 mg total) by mouth daily. (Patient  taking differently: Take 200 mg by mouth every evening.) 90 tablet 3   atorvastatin (LIPITOR) 40 MG tablet Take 1 tablet (40 mg total) by mouth every evening.     blood glucose meter kit and supplies Dispense based on patient and insurance preference. Use up to four times daily as directed. (FOR ICD-10 E10.9, E11.9). 1 each 0   diphenhydramine-acetaminophen (TYLENOL PM) 25-500 MG TABS tablet Take 2 tablets by mouth at bedtime as needed (sleep/headache).     ferric citrate (AURYXIA) 1 GM 210 MG(Fe) tablet Take 1 tablet (210 mg total) by mouth daily with supper. 270 tablet    fluticasone (FLONASE) 50 MCG/ACT nasal spray Place 1-2 sprays into both nostrils daily as needed for allergies or rhinitis.     gabapentin (NEURONTIN) 100 MG capsule Take 1 capsule (100 mg total) by mouth 2 (two) times daily. Take 1 PO at night for 5 days then go to twice daily 60 capsule 2   glucose blood (ACCU-CHEK GUIDE) test strip USE 1 STRIP TO CHECK FASTING BLOOD SUGAR AND 2 HOURS AFTER LARGEST MEAL 100 each 0   insulin glargine (LANTUS) 100 UNIT/ML injection Inject into the skin daily as needed (low blood glucose 180). Unknow dose to give if needed     lidocaine-prilocaine (EMLA) cream Apply topically.     loratadine (CLARITIN) 10 MG tablet Take 10 mg by mouth every evening.      midodrine (PROAMATINE) 10 MG tablet Take 1 tablet (10 mg total) by mouth 3 (three) times daily as needed (for hypotension/dialysis). AS  ordered. Take In the evening 10 mg  on Mon. Wed and Friday Take 20 mg on Dialysis days Tues, thurs and Saturday. 10 mg when he gets there and 10 mg half way through. 90 tablet 3   nitroGLYCERIN (NITROSTAT) 0.4 MG SL tablet Place 1 tablet (0.4 mg total) under the tongue every 5 (five) minutes as needed for chest pain. 14 tablet 12   omeprazole (PRILOSEC OTC) 20 MG tablet Take 40 mg by mouth daily before breakfast.      pembrolizumab (KEYTRUDA) 100 MG/4ML SOLN Inject into the vein.     prochlorperazine (COMPAZINE) 10  MG tablet Take 1 tablet (10 mg total) by mouth every 6 (six) hours as needed for nausea or vomiting. 30 tablet 0   Sennosides (SENOKOT EXTRA STRENGTH) 17.2 MG TABS Take 34.4 mg by mouth at bedtime.     traMADol (ULTRAM) 50 MG tablet Take 50 mg by mouth every 12 (twelve) hours as needed (Pain).     warfarin (COUMADIN) 3 MG tablet TAKE 1 TO 2 TABLETS BY MOUTH DAILY AS DIRECTED BY  THE  COUMADIN  CLINIC 60 tablet 5   No current facility-administered medications for this visit.     Allergies:  Allergies  Allergen Reactions   Penicillins Rash and Other (See Comments)    Has patient had a PCN reaction causing immediate rash, facial/tongue/throat swelling, SOB or lightheadedness with hypotension: yes Has patient had a PCN reaction causing severe rash involving mucus membranes or skin necrosis: no Has patient had a PCN reaction that required hospitalization: no Has patient had a PCN reaction occurring within the last 10 years: no If all of the above answers are "NO", then may proceed with Cephalosporin use.       Physical Exam:  Blood pressure 116/68, pulse (!) 123, temperature 97.7 F (36.5 C), temperature source Temporal, resp. rate 19, height '6\' 1"'  (1.854 m), weight 292 lb 6.4 oz (132.6 kg), SpO2 99 %.      ECOG: 1   General appearance: Comfortable appearing without any discomfort Head: Normocephalic without any trauma Oropharynx: Mucous membranes are moist and pink without any thrush or ulcers. Eyes: Pupils are equal and round reactive to light. Lymph nodes: No cervical, supraclavicular, inguinal or axillary lymphadenopathy.   Heart:regular rate and rhythm.  S1 and S2 without leg edema. Lung: Clear without any rhonchi or wheezes.  No dullness to percussion. Abdomin: Soft, nontender, nondistended with good bowel sounds.  No hepatosplenomegaly. Musculoskeletal: No joint deformity or effusion.  Full range of motion noted. Neurological: No deficits noted on motor, sensory and deep  tendon reflex exam. Skin: No petechial rash or dryness.  Appeared moist.          Lab Results: Lab Results  Component Value Date   WBC 9.0 08/11/2021   HGB 11.3 (L) 08/11/2021   HCT 34.1 (L) 08/11/2021   MCV 93.7 08/11/2021   PLT 276 08/11/2021     Chemistry      Component Value Date/Time   NA 139 08/11/2021 0956   NA 139 04/02/2021 0908   K 3.9 08/11/2021 0956   CL 99 08/11/2021 0956   CO2 22 08/11/2021 0956   BUN 43 (H) 08/11/2021 0956   BUN 29 (H) 04/02/2021 0908   CREATININE 9.11 (HH) 08/11/2021 0956   GLU 96 03/14/2019 0000      Component Value Date/Time   CALCIUM 9.6 08/11/2021 0956   ALKPHOS 54 08/11/2021 0956   AST 19 08/11/2021 0956   ALT 15  08/11/2021 0956   BILITOT 0.5 08/11/2021 0956      IMPRESSION: 1. Similar size and number of numerous metastatic lesions in the lungs, as above. While some of these are slightly smaller than the prior study, but others are slightly larger, none of these have substantially change compared to the prior examination, and there are no new lesions. 2. Persistent mass-like density in the posterior aspect of the left hemithorax, likely to represent a combination of chronic pleural thickening, resolving chronic left pleural fluid collection and adjacent rounded atelectasis in the left lower lobe. This is poorly evaluated on today's noncontrast CT examination. Future studies should be performed with IV contrast if possible to better evaluate this finding. 3. No findings to suggest metastatic disease in the abdomen or pelvis. 4. Hepatic steatosis. 5. Aortic atherosclerosis, in addition to left main and three-vessel coronary artery disease. Please note that although the presence of coronary artery calcium documents the presence of coronary artery disease, the severity of this disease and any potential stenosis cannot be assessed on this non-gated CT examination. Assessment for potential risk factor modification, dietary  therapy or pharmacologic therapy may be warranted, if clinically indicated. 6. There are calcifications of the aortic valve. Echocardiographic correlation for evaluation of potential valvular dysfunction may be warranted if clinically indicated.     Impression and Plan:    1.  Kidney cancer diagnosed in 2022.  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   CT scan obtained on September 25, 2021 was personally reviewed and showed no clear evidence of disease progression.  He has limited pulmonary nodules and has stabilized in size.  Some has regressed as well.  Risks and benefits of continuing Pembrolizumab were discussed.  Alternative treatment options including combination immunotherapy versus adding axitinib to Pembrolizumab were reiterated.  After discussion today, he is agreeable to continue at this time.  2.  IV access: Peripheral veins are currently in use at this time.   3.  Immune mediated complications: I continue to educate him about potential complication including pneumonitis, colitis and thyroid disease.  He has not experienced any at this time.   4.  Antiemetics: No nausea or vomiting reported at this time.  Compazine is available to him.  5.  Renal failure: He continues to me on dialysis without any recent complications.  6.  Tachycardia: Appears to be chronic in nature and unchanged.   6.  Follow-up: In 6 weeks for repeat follow-up.   30  minutes were spent on this encounter.  The time was dedicated to reviewing laboratory data, disease status update and outlining future plan of care discussion.     Zola Button, MD 1/25/202310:38 AM

## 2021-09-22 NOTE — Progress Notes (Signed)
Per Dr Alen Blew, ok to treat with elevated HR.

## 2021-09-22 NOTE — Telephone Encounter (Signed)
George Lewis from lab called with critical creatinine 8.7. Provider made aware

## 2021-09-28 ENCOUNTER — Ambulatory Visit (INDEPENDENT_AMBULATORY_CARE_PROVIDER_SITE_OTHER): Payer: Medicare Other

## 2021-09-28 ENCOUNTER — Other Ambulatory Visit: Payer: Self-pay

## 2021-09-28 DIAGNOSIS — I480 Paroxysmal atrial fibrillation: Secondary | ICD-10-CM | POA: Diagnosis not present

## 2021-09-28 DIAGNOSIS — Z5181 Encounter for therapeutic drug level monitoring: Secondary | ICD-10-CM

## 2021-09-28 LAB — POCT INR: INR: 2.3 (ref 2.0–3.0)

## 2021-09-28 NOTE — Patient Instructions (Signed)
Description   Continue taking 1 tablet daily except 0.5 tablet on Mondays and Fridays. Repeat INR in 5 weeks. [facility] [phone]

## 2021-10-28 ENCOUNTER — Telehealth: Payer: Self-pay | Admitting: Internal Medicine

## 2021-10-28 MED ORDER — AMIODARONE HCL 200 MG PO TABS: 200.0000 mg | ORAL_TABLET | Freq: Every evening | ORAL | 1 refills | Status: DC

## 2021-10-28 NOTE — Telephone Encounter (Signed)
?*  STAT* If patient is at the pharmacy, call can be transferred to refill team. ? ? ?1. Which medications need to be refilled? (please list name of each medication and dose if known) amiodarone (PACERONE) 200 MG tablet ? ?2. Which pharmacy/location (including street and city if local pharmacy) is medication to be sent to? [place], [place] ? ?3. Do they need a 30 day or 90 day supply? 90 ? ?

## 2021-11-02 ENCOUNTER — Ambulatory Visit (INDEPENDENT_AMBULATORY_CARE_PROVIDER_SITE_OTHER): Payer: Medicare Other

## 2021-11-02 ENCOUNTER — Other Ambulatory Visit: Payer: Self-pay

## 2021-11-02 DIAGNOSIS — I480 Paroxysmal atrial fibrillation: Secondary | ICD-10-CM

## 2021-11-02 DIAGNOSIS — Z5181 Encounter for therapeutic drug level monitoring: Secondary | ICD-10-CM | POA: Diagnosis not present

## 2021-11-02 LAB — POCT INR: INR: 4.4 — AB (ref 2.0–3.0)

## 2021-11-02 NOTE — Patient Instructions (Signed)
Description   ?Hold today's dose and eat a serving of greens and then continue taking 1 tablet daily except 0.5 tablet on Mondays and Fridays. Repeat INR in 2 weeks. [facility]in Clinic [phone] ?  ?   ?

## 2021-11-03 ENCOUNTER — Inpatient Hospital Stay (HOSPITAL_BASED_OUTPATIENT_CLINIC_OR_DEPARTMENT_OTHER): Payer: Medicare Other | Admitting: Oncology

## 2021-11-03 ENCOUNTER — Inpatient Hospital Stay: Payer: Medicare Other

## 2021-11-03 ENCOUNTER — Inpatient Hospital Stay: Payer: Medicare Other | Attending: Oncology

## 2021-11-03 VITALS — BP 101/67 | HR 130 | Temp 96.8°F | Resp 18 | Wt 291.4 lb

## 2021-11-03 VITALS — HR 120

## 2021-11-03 DIAGNOSIS — Z5112 Encounter for antineoplastic immunotherapy: Secondary | ICD-10-CM | POA: Diagnosis present

## 2021-11-03 DIAGNOSIS — E079 Disorder of thyroid, unspecified: Secondary | ICD-10-CM | POA: Insufficient documentation

## 2021-11-03 DIAGNOSIS — R000 Tachycardia, unspecified: Secondary | ICD-10-CM | POA: Insufficient documentation

## 2021-11-03 DIAGNOSIS — R059 Cough, unspecified: Secondary | ICD-10-CM | POA: Diagnosis not present

## 2021-11-03 DIAGNOSIS — Z992 Dependence on renal dialysis: Secondary | ICD-10-CM | POA: Diagnosis not present

## 2021-11-03 DIAGNOSIS — C7802 Secondary malignant neoplasm of left lung: Secondary | ICD-10-CM | POA: Diagnosis not present

## 2021-11-03 DIAGNOSIS — E032 Hypothyroidism due to medicaments and other exogenous substances: Secondary | ICD-10-CM

## 2021-11-03 DIAGNOSIS — K529 Noninfective gastroenteritis and colitis, unspecified: Secondary | ICD-10-CM | POA: Diagnosis not present

## 2021-11-03 DIAGNOSIS — Z79899 Other long term (current) drug therapy: Secondary | ICD-10-CM | POA: Insufficient documentation

## 2021-11-03 DIAGNOSIS — C641 Malignant neoplasm of right kidney, except renal pelvis: Secondary | ICD-10-CM

## 2021-11-03 DIAGNOSIS — C649 Malignant neoplasm of unspecified kidney, except renal pelvis: Secondary | ICD-10-CM | POA: Diagnosis not present

## 2021-11-03 DIAGNOSIS — Z923 Personal history of irradiation: Secondary | ICD-10-CM | POA: Insufficient documentation

## 2021-11-03 LAB — CBC WITH DIFFERENTIAL (CANCER CENTER ONLY)
Abs Immature Granulocytes: 0.17 10*3/uL — ABNORMAL HIGH (ref 0.00–0.07)
Basophils Absolute: 0.1 10*3/uL (ref 0.0–0.1)
Basophils Relative: 1 %
Eosinophils Absolute: 0.4 10*3/uL (ref 0.0–0.5)
Eosinophils Relative: 3 %
HCT: 32.6 % — ABNORMAL LOW (ref 39.0–52.0)
Hemoglobin: 11.3 g/dL — ABNORMAL LOW (ref 13.0–17.0)
Immature Granulocytes: 2 %
Lymphocytes Relative: 14 %
Lymphs Abs: 1.5 10*3/uL (ref 0.7–4.0)
MCH: 33.1 pg (ref 26.0–34.0)
MCHC: 34.7 g/dL (ref 30.0–36.0)
MCV: 95.6 fL (ref 80.0–100.0)
Monocytes Absolute: 0.9 10*3/uL (ref 0.1–1.0)
Monocytes Relative: 9 %
Neutro Abs: 7.8 10*3/uL — ABNORMAL HIGH (ref 1.7–7.7)
Neutrophils Relative %: 71 %
Platelet Count: 328 10*3/uL (ref 150–400)
RBC: 3.41 MIL/uL — ABNORMAL LOW (ref 4.22–5.81)
RDW: 14.7 % (ref 11.5–15.5)
WBC Count: 10.8 10*3/uL — ABNORMAL HIGH (ref 4.0–10.5)
nRBC: 0 % (ref 0.0–0.2)

## 2021-11-03 LAB — CMP (CANCER CENTER ONLY)
ALT: 10 U/L (ref 0–44)
AST: 14 U/L — ABNORMAL LOW (ref 15–41)
Albumin: 4.3 g/dL (ref 3.5–5.0)
Alkaline Phosphatase: 55 U/L (ref 38–126)
Anion gap: 16 — ABNORMAL HIGH (ref 5–15)
BUN: 36 mg/dL — ABNORMAL HIGH (ref 8–23)
CO2: 25 mmol/L (ref 22–32)
Calcium: 9.5 mg/dL (ref 8.9–10.3)
Chloride: 95 mmol/L — ABNORMAL LOW (ref 98–111)
Creatinine: 8.55 mg/dL (ref 0.61–1.24)
GFR, Estimated: 6 mL/min — ABNORMAL LOW (ref >60)
Glucose, Bld: 141 mg/dL — ABNORMAL HIGH (ref 70–99)
Potassium: 3.5 mmol/L (ref 3.5–5.1)
Sodium: 136 mmol/L (ref 135–145)
Total Bilirubin: 0.4 mg/dL (ref 0.3–1.2)
Total Protein: 8 g/dL (ref 6.5–8.1)

## 2021-11-03 LAB — TSH: TSH: 1.565 u[IU]/mL (ref 0.320–4.118)

## 2021-11-03 MED ORDER — SODIUM CHLORIDE 0.9 % IV SOLN
400.0000 mg | Freq: Once | INTRAVENOUS | Status: AC
  Administered 2021-11-03: 400 mg via INTRAVENOUS
  Filled 2021-11-03: qty 16

## 2021-11-03 MED ORDER — SODIUM CHLORIDE 0.9 % IV SOLN: Freq: Once | INTRAVENOUS | Status: AC

## 2021-11-03 NOTE — Patient Instructions (Signed)
[facility]  Discharge Instructions: ?Thank you for choosing [facility] to provide your oncology and hematology care.  ? ?If you have a lab appointment with the Cancer Center, please go directly to the Cancer Center and check in at the registration area. ?  ?Wear comfortable clothing and clothing appropriate for easy access to any Portacath or PICC line.  ? ?We strive to give you quality time with your provider. You may need to reschedule your appointment if you arrive late (15 or more minutes).  Arriving late affects you and other patients whose appointments are after yours.  Also, if you miss three or more appointments without notifying the office, you may be dismissed from the clinic at the provider?s discretion.    ?  ?For prescription refill requests, have your pharmacy contact our office and allow 72 hours for refills to be completed.   ? ?Today you received the following chemotherapy and/or immunotherapy agents: Keytruda ?  ?To help prevent nausea and vomiting after your treatment, we encourage you to take your nausea medication as directed. ? ?BELOW ARE SYMPTOMS THAT SHOULD BE REPORTED IMMEDIATELY: ?*FEVER GREATER THAN 100.4 F (38 ?C) OR HIGHER ?*CHILLS OR SWEATING ?*NAUSEA AND VOMITING THAT IS NOT CONTROLLED WITH YOUR NAUSEA MEDICATION ?*UNUSUAL SHORTNESS OF BREATH ?*UNUSUAL BRUISING OR BLEEDING ?*URINARY PROBLEMS (pain or burning when urinating, or frequent urination) ?*BOWEL PROBLEMS (unusual diarrhea, constipation, pain near the anus) ?TENDERNESS IN MOUTH AND THROAT WITH OR WITHOUT PRESENCE OF ULCERS (sore throat, sores in mouth, or a toothache) ?UNUSUAL RASH, SWELLING OR PAIN  ?UNUSUAL VAGINAL DISCHARGE OR ITCHING  ? ?Items with * indicate a potential emergency and should be followed up as soon as possible or go to the Emergency Department if any problems should occur. ? ?Please show the CHEMOTHERAPY ALERT CARD or IMMUNOTHERAPY ALERT CARD at check-in to the  Emergency Department and triage nurse. ? ?Should you have questions after your visit or need to cancel or reschedule your appointment, please contact [facility]  Dept: [phone]  and follow the prompts.  Office hours are 8:00 a.m. to 4:30 p.m. Monday - Friday. Please note that voicemails left after 4:00 p.m. may not be returned until the following business day.  We are closed weekends and major holidays. You have access to a nurse at all times for urgent questions. Please call the main number to the clinic Dept: [phone] and follow the prompts. ? ? ?For any non-urgent questions, you may also contact your provider using MyChart. We now offer e-Visits for anyone 18 and older to request care online for non-urgent symptoms. For details visit [email or web address]. ?  ?Also download the MyChart app! Go to the app store, search "MyChart", open the app, select [facility], and log in with your MyChart username and password. ? ?Due to Covid, a mask is required upon entering the hospital/clinic. If you do not have a mask, one will be given to you upon arrival. For doctor visits, patients may have 1 support person aged 18 or older with them. For treatment visits, patients cannot have anyone with them due to current Covid guidelines and our immunocompromised population.  ? ?

## 2021-11-03 NOTE — Progress Notes (Signed)
Hematology and Oncology Follow Up Visit ? ?George Lewis. ?[id number] ?[birth year] 66 y.o. ?11/03/2021 11:29 AM ?George Oka, PA-C  ? ?Principle Diagnosis: 66 year old man with kidney cancer diagnosed in 2019 with localized disease.  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  ? ? ?Prior Therapy: ? ?He underwent laparoscopic radical nephrectomy completed on May 16, 2018 under the care of Dr. Gloriann Loan.  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  Histological grade 4.  ? ?He underwent robotic assisted left upper lobe wedge resection completed by Dr. Roxan Hockey on October 16, 2020.  The final pathology showed clear-cell renal cell carcinoma with negative margins.   ? ?He is status post radiation therapy to the left upper lobe and the right lower lobe pulmonary nodules.  October 2022. ? ? ?Current therapy: Pembrolizumab 400 mg every 6 weeks started in Jan 13, 2021.  He is here for cycle 7 of therapy. ? ?Interim History: George Lewis returns today for repeat evaluation.  Since the last visit, he denies any complications related to Pembrolizumab.  He denies any nausea, vomiting or abdominal pain.  He denies any worsening diarrhea.  He does report occasional cough and congestion but no dyspnea on exertion or hemoptysis.  Performance status quality of life remains unchanged. ? ? ? ? ?Medications: Updated on review. ?Current Outpatient Medications  ?Medication Sig Dispense Refill  ? Accu-Chek FastClix Lancets MISC Use to check fasting blood sugar and 2 hrs after largest meal. 100 each 0  ? acetaminophen (TYLENOL) 500 MG tablet Take 1,000 mg by mouth every 8 (eight) hours as needed for mild pain or headache.    ? allopurinol (ZYLOPRIM) 100 MG tablet Take 1 tablet (100 mg total) by mouth every evening. 30 tablet 0  ? amiodarone  (PACERONE) 200 MG tablet Take 1 tablet (200 mg total) by mouth every evening. 90 tablet 1  ? atorvastatin (LIPITOR) 40 MG tablet Take 1 tablet (40 mg total) by mouth every evening.    ? blood glucose meter kit and supplies Dispense based on patient and insurance preference. Use up to four times daily as directed. (FOR ICD-10 E10.9, E11.9). 1 each 0  ? diphenhydramine-acetaminophen (TYLENOL PM) 25-500 MG TABS tablet Take 2 tablets by mouth at bedtime as needed (sleep/headache).    ? ferric citrate (AURYXIA) 1 GM 210 MG(Fe) tablet Take 1 tablet (210 mg total) by mouth daily with supper. 270 tablet   ? fluticasone (FLONASE) 50 MCG/ACT nasal spray Place 1-2 sprays into both nostrils daily as needed for allergies or rhinitis.    ? gabapentin (NEURONTIN) 100 MG capsule Take 1 capsule (100 mg total) by mouth 2 (two) times daily. Take 1 PO at night for 5 days then go to twice daily 60 capsule 2  ? glucose blood (ACCU-CHEK GUIDE) test strip USE 1 STRIP TO CHECK FASTING BLOOD SUGAR AND 2 HOURS AFTER LARGEST MEAL 100 each 0  ? insulin glargine (LANTUS) 100 UNIT/ML injection Inject into the skin daily as needed (low blood glucose 180). Unknow dose to give if needed    ? lidocaine-prilocaine (EMLA) cream Apply topically.    ? loratadine (CLARITIN) 10 MG tablet Take 10 mg by mouth every evening.     ? midodrine (PROAMATINE) 10 MG tablet Take 1 tablet (10 mg total) by mouth 3 (three) times daily as needed (for hypotension/dialysis). AS ordered. Take  In the evening 10 mg  on Mon. Wed and Friday Take 20 mg on Dialysis days Tues, thurs and Saturday. 10 mg when he gets there and 10 mg half way through. 90 tablet 3  ? nitroGLYCERIN (NITROSTAT) 0.4 MG SL tablet Place 1 tablet (0.4 mg total) under the tongue every 5 (five) minutes as needed for chest pain. 14 tablet 12  ? omeprazole (PRILOSEC OTC) 20 MG tablet Take 40 mg by mouth daily before breakfast.     ? pembrolizumab (KEYTRUDA) 100 MG/4ML SOLN Inject into the vein.    ?  prochlorperazine (COMPAZINE) 10 MG tablet Take 1 tablet (10 mg total) by mouth every 6 (six) hours as needed for nausea or vomiting. 30 tablet 0  ? Sennosides (SENOKOT EXTRA STRENGTH) 17.2 MG TABS Take 34.4 mg by mouth at bedtime.    ? traMADol (ULTRAM) 50 MG tablet Take 50 mg by mouth every 12 (twelve) hours as needed (Pain).    ? warfarin (COUMADIN) 3 MG tablet TAKE 1 TO 2 TABLETS BY MOUTH DAILY AS DIRECTED BY  THE  COUMADIN  CLINIC 60 tablet 5  ? ?No current facility-administered medications for this visit.  ? ? ? ?Allergies:  ?Allergies  ?Allergen Reactions  ? Penicillins Rash and Other (See Comments)  ?  Has patient had a PCN reaction causing immediate rash, facial/tongue/throat swelling, SOB or lightheadedness with hypotension: yes ?Has patient had a PCN reaction causing severe rash involving mucus membranes or skin necrosis: no ?Has patient had a PCN reaction that required hospitalization: no ?Has patient had a PCN reaction occurring within the last 10 years: no ?If all of the above answers are "NO", then may proceed with Cephalosporin use. ?  ? ? ? ? ?Physical Exam: ? ?Blood pressure 101/67, pulse (!) 130, temperature (!) 96.8 ?F (36 ?C), temperature source Tympanic, resp. rate 18, weight 291 lb 7 oz (132.2 kg), SpO2 97 %. ? ? ? ? ? ?ECOG: 1 ? ? ? ?General appearance: Alert, awake without any distress. ?Head: Atraumatic without abnormalities ?Oropharynx: Without any thrush or ulcers. ?Eyes: No scleral icterus. ?Lymph nodes: No lymphadenopathy noted in the cervical, supraclavicular, or axillary nodes ?Heart:regular rate and rhythm, without any murmurs or gallops.   ?Lung: Clear to auscultation without any rhonchi, wheezes or dullness to percussion. ?Abdomin: Soft, nontender without any shifting dullness or ascites. ?Musculoskeletal: No clubbing or cyanosis. ?Neurological: No motor or sensory deficits. ?Skin: No rashes or lesions. ? ? ? ? ? ? ? ? ?Lab Results: ?Lab Results  ?Component Value Date  ? WBC 10.8  (H) 11/03/2021  ? HGB 11.3 (L) 11/03/2021  ? HCT 32.6 (L) 11/03/2021  ? MCV 95.6 11/03/2021  ? PLT 328 11/03/2021  ? ?  Chemistry   ?   ?Component Value Date/Time  ? NA 138 09/22/2021 1028  ? NA 139 04/02/2021 0908  ? K 4.1 09/22/2021 1028  ? CL 96 (L) 09/22/2021 1028  ? CO2 25 09/22/2021 1028  ? BUN 42 (H) 09/22/2021 1028  ? BUN 29 (H) 04/02/2021 0908  ? CREATININE 8.70 (HH) 09/22/2021 1028  ? GLU 96 03/14/2019 0000  ?    ?Component Value Date/Time  ? CALCIUM 9.6 09/22/2021 1028  ? ALKPHOS 47 09/22/2021 1028  ? AST 16 09/22/2021 1028  ? ALT 12 09/22/2021 1028  ? BILITOT 0.4 09/22/2021 1028  ?  ? ? ?  ? ? ?Impression and Plan: ? ? ? ?1.  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 ?  ?Treatment options moving forward were discussed at this time.  He continues to be on single agent Pembrolizumab without any major complications.  Additional treatment options including the addition of oral targeted therapy with axitinib or lenvatinib could be considered if he has progression of disease.  We will update his staging scan before the next visit. ? ?2.  IV access: No complications noted to peripheral IVs at this time.  Already cath options been deferred. ?  ?3.  Immune mediated complications: He has not experienced any complications.  At this time pneumonitis colitis, thyroid disease were reiterated. ?  ?4.  Antiemetics: Compazine is available to him without any nausea or vomiting. ? ?5.  Renal failure: He is currently on dialysis which not interfering with his treatment at this time. ? ?6.  Tachycardia: He is in normal sinus rhythm without any evidence of her arrhythmia.  He continues to follow with cardiology regarding this issue. ? ? ?7.  Follow-up: He will return in 6 weeks for follow-up visit. ?  ?30  minutes were dedicated to this visit.  The time was spent on reviewing laboratory data, disease status update and outlining future plan of care  discussion. ?  ? ? ?Zola Button, MD ?3/8/202311:29 AM ? ?

## 2021-11-03 NOTE — Progress Notes (Signed)
Per Dr. Alen Blew, ok to treat with pulse of 120 ?

## 2021-11-16 ENCOUNTER — Other Ambulatory Visit: Payer: Self-pay

## 2021-11-16 ENCOUNTER — Ambulatory Visit (INDEPENDENT_AMBULATORY_CARE_PROVIDER_SITE_OTHER): Payer: Medicare Other

## 2021-11-16 DIAGNOSIS — Z7901 Long term (current) use of anticoagulants: Secondary | ICD-10-CM

## 2021-11-16 DIAGNOSIS — I480 Paroxysmal atrial fibrillation: Secondary | ICD-10-CM | POA: Diagnosis not present

## 2021-11-16 LAB — POCT INR: INR: 3.4 — AB (ref 2.0–3.0)

## 2021-11-16 NOTE — Patient Instructions (Signed)
Description   ?Hold today's dose and then continue taking 1 tablet daily except 0.5 tablet on Mondays and Fridays. Repeat INR in 2 weeks. [facility] [phone] ?  ?   ?

## 2021-11-20 ENCOUNTER — Emergency Department (HOSPITAL_COMMUNITY): Payer: Medicare Other

## 2021-11-20 ENCOUNTER — Other Ambulatory Visit: Payer: Self-pay

## 2021-11-20 ENCOUNTER — Emergency Department (HOSPITAL_COMMUNITY)
Admission: EM | Admit: 2021-11-20 | Discharge: 2021-11-20 | Disposition: A | Discharge status: Home / Self Care | Payer: Medicare Other | Attending: Emergency Medicine | Admitting: Emergency Medicine

## 2021-11-20 ENCOUNTER — Encounter (HOSPITAL_COMMUNITY): Payer: Self-pay | Admitting: Emergency Medicine

## 2021-11-20 DIAGNOSIS — R059 Cough, unspecified: Secondary | ICD-10-CM | POA: Insufficient documentation

## 2021-11-20 DIAGNOSIS — I4891 Unspecified atrial fibrillation: Secondary | ICD-10-CM | POA: Diagnosis not present

## 2021-11-20 DIAGNOSIS — Z794 Long term (current) use of insulin: Secondary | ICD-10-CM | POA: Diagnosis not present

## 2021-11-20 DIAGNOSIS — Z85118 Personal history of other malignant neoplasm of bronchus and lung: Secondary | ICD-10-CM | POA: Diagnosis not present

## 2021-11-20 DIAGNOSIS — R0789 Other chest pain: Secondary | ICD-10-CM | POA: Diagnosis not present

## 2021-11-20 DIAGNOSIS — N185 Chronic kidney disease, stage 5: Secondary | ICD-10-CM | POA: Insufficient documentation

## 2021-11-20 DIAGNOSIS — R0602 Shortness of breath: Secondary | ICD-10-CM | POA: Insufficient documentation

## 2021-11-20 DIAGNOSIS — Z79899 Other long term (current) drug therapy: Secondary | ICD-10-CM | POA: Insufficient documentation

## 2021-11-20 DIAGNOSIS — Z7901 Long term (current) use of anticoagulants: Secondary | ICD-10-CM | POA: Insufficient documentation

## 2021-11-20 LAB — TROPONIN I (HIGH SENSITIVITY)
Troponin I (High Sensitivity): 43 ng/L — ABNORMAL HIGH (ref <18)
Troponin I (High Sensitivity): 41 ng/L — ABNORMAL HIGH (ref <18)

## 2021-11-20 LAB — CBC
HCT: 34 % — ABNORMAL LOW (ref 39.0–52.0)
Hemoglobin: 11.3 g/dL — ABNORMAL LOW (ref 13.0–17.0)
MCH: 32.4 pg (ref 26.0–34.0)
MCHC: 33.2 g/dL (ref 30.0–36.0)
MCV: 97.4 fL (ref 80.0–100.0)
Platelets: 303 10*3/uL (ref 150–400)
RBC: 3.49 MIL/uL — ABNORMAL LOW (ref 4.22–5.81)
RDW: 14.3 % (ref 11.5–15.5)
WBC: 9.7 10*3/uL (ref 4.0–10.5)
nRBC: 0 % (ref 0.0–0.2)

## 2021-11-20 LAB — BASIC METABOLIC PANEL
Anion gap: 13 (ref 5–15)
BUN: 17 mg/dL (ref 8–23)
CO2: 28 mmol/L (ref 22–32)
Calcium: 9.3 mg/dL (ref 8.9–10.3)
Chloride: 95 mmol/L — ABNORMAL LOW (ref 98–111)
Creatinine, Ser: 4.66 mg/dL — ABNORMAL HIGH (ref 0.61–1.24)
GFR, Estimated: 13 mL/min — ABNORMAL LOW (ref >60)
Glucose, Bld: 107 mg/dL — ABNORMAL HIGH (ref 70–99)
Potassium: 3.8 mmol/L (ref 3.5–5.1)
Sodium: 136 mmol/L (ref 135–145)

## 2021-11-20 LAB — BRAIN NATRIURETIC PEPTIDE: B Natriuretic Peptide: 72.7 pg/mL (ref 0.0–100.0)

## 2021-11-20 MED ORDER — IOHEXOL 350 MG/ML SOLN
100.0000 mL | Freq: Once | INTRAVENOUS | Status: AC | PRN
  Administered 2021-11-20: 100 mL via INTRAVENOUS

## 2021-11-20 MED ORDER — DOXYCYCLINE HYCLATE 100 MG PO CAPS: 100.0000 mg | ORAL_CAPSULE | Freq: Two times a day (BID) | ORAL | 0 refills | Status: AC

## 2021-11-20 MED ORDER — DOXYCYCLINE HYCLATE 100 MG PO TABS
100.0000 mg | ORAL_TABLET | Freq: Once | ORAL | Status: AC
  Administered 2021-11-20: 100 mg via ORAL
  Filled 2021-11-20: qty 1

## 2021-11-20 MED ORDER — DOXYCYCLINE HYCLATE 100 MG PO TABS: 100.0000 mg | ORAL_TABLET | Freq: Once | ORAL | Status: DC

## 2021-11-20 MED ORDER — SODIUM CHLORIDE 0.9 % IV SOLN
1.0000 g | Freq: Once | INTRAVENOUS | Status: AC
  Administered 2021-11-20: 1 g via INTRAVENOUS
  Filled 2021-11-20: qty 10

## 2021-11-20 MED ORDER — AZITHROMYCIN 250 MG PO TABS: 500.0000 mg | ORAL_TABLET | Freq: Every day | ORAL | Status: DC

## 2021-11-20 NOTE — ED Provider Notes (Signed)
?[facility] DEPT ?Provider Note ? ? ?CSN: [id number] ?Arrival date & time: 11/20/21  1104 ? ?  ? ?History ? ?Chief Complaint  ?Patient presents with  ? Shortness of Breath  ? Chest Pain  ? ? ?George Lewis. is a 66 y.o. male with medical history significant for CKD stage V, anemia, A-fib currently anticoagulated on heparin, kidney cancer, lung cancer.  Patient presents to ED due to 2 weeks of shortness of breath and chest pain.  Patient reports that for the last 2 weeks he has been experiencing shortness of breath and exertional chest pain.  Patient continues that the symptoms worsened this last Monday.  Patient states that he is currently dialysis patient and receives treatment Tuesday, Thursday and Saturday.  Patient reports that after his treatment today his chest pain and shortness of breath worsened prompting him to return present to ED for evaluation.  Patient states the chest pain is located in the middle of his chest and wraps around his back to the left.  The patient is also complaining of left arm numbness.  Patient states the chest pain is worse with exertion.  Patient states that chest pain is not consistent, it comes and goes.  He describes it as a "pressure like something is sitting on me".  Patient denies taking any OTC medications prior to presentation.  Patient had stage IV kidney cancer at his right kidney removed.  His kidney cancer metastasized to his left lower and middle lobe which were removed by Dr. Roxan Hockey.  Patient has history of blood clots.  Patient is endorsing chest pain, cough and shortness of breath.  Patient denies fevers, nausea, vomiting, lower extremity swelling. ? ? ? ?Shortness of Breath ?Associated symptoms: chest pain and cough   ?Associated symptoms: no fever and no vomiting   ?Chest Pain ?Associated symptoms: cough and shortness of breath   ?Associated symptoms: no fever, no nausea and no vomiting   ? ?  ? ?Home Medications ?Prior to  Admission medications   ?Medication Sig Start Date End Date Taking? Authorizing Provider  ?doxycycline (VIBRAMYCIN) 100 MG capsule Take 1 capsule (100 mg total) by mouth 2 (two) times daily for 7 days. 11/20/21 11/27/21 Yes Azucena Cecil, PA-C  ?Accu-Chek FastClix Lancets MISC Use to check fasting blood sugar and 2 hrs after largest meal. 12/26/19   Abonza, Maritza, PA-C  ?acetaminophen (TYLENOL) 500 MG tablet Take 1,000 mg by mouth every 8 (eight) hours as needed for mild pain or headache.    [provider]  ?allopurinol (ZYLOPRIM) 100 MG tablet Take 1 tablet (100 mg total) by mouth every evening. 04/08/21   Lorrene Reid, PA-C  ?amiodarone (PACERONE) 200 MG tablet Take 1 tablet (200 mg total) by mouth every evening. 10/28/21   Elouise Munroe, MD  ?atorvastatin (LIPITOR) 40 MG tablet Take 1 tablet (40 mg total) by mouth every evening. 10/19/20   John Giovanni, PA-C  ?blood glucose meter kit and supplies Dispense based on patient and insurance preference. Use up to four times daily as directed. (FOR ICD-10 E10.9, E11.9). 12/22/18   Danford, Suann Larry, MD  ?diphenhydramine-acetaminophen (TYLENOL PM) 25-500 MG TABS tablet Take 2 tablets by mouth at bedtime as needed (sleep/headache).    [provider]  ?ferric citrate (AURYXIA) 1 GM 210 MG(Fe) tablet Take 1 tablet (210 mg total) by mouth daily with supper. 10/19/20   John Giovanni, PA-C  ?fluticasone (FLONASE) 50 MCG/ACT nasal spray Place 1-2 sprays into  both nostrils daily as needed for allergies or rhinitis.    [provider]  ?gabapentin (NEURONTIN) 100 MG capsule Take 1 capsule (100 mg total) by mouth 2 (two) times daily. Take 1 PO at night for 5 days then go to twice daily 11/04/20   Melrose Nakayama, MD  ?glucose blood (ACCU-CHEK GUIDE) test strip USE 1 STRIP TO CHECK FASTING BLOOD SUGAR AND 2 HOURS AFTER LARGEST MEAL 08/02/21   Lorrene Reid, PA-C  ?insulin glargine (LANTUS) 100 UNIT/ML injection Inject into the skin  daily as needed (low blood glucose 180). Unknow dose to give if needed    [provider]  ?lidocaine-prilocaine (EMLA) cream Apply topically. 01/11/21   [provider]  ?loratadine (CLARITIN) 10 MG tablet Take 10 mg by mouth every evening.     [provider]  ?midodrine (PROAMATINE) 10 MG tablet Take 1 tablet (10 mg total) by mouth 3 (three) times daily as needed (for hypotension/dialysis). AS ordered. Take In the evening 10 mg  on Mon. Wed and Friday Take 20 mg on Dialysis days Tues, thurs and Saturday. 10 mg when he gets there and 10 mg half way through. 07/09/21   Elouise Munroe, MD  ?nitroGLYCERIN (NITROSTAT) 0.4 MG SL tablet Place 1 tablet (0.4 mg total) under the tongue every 5 (five) minutes as needed for chest pain. 03/07/19   Swayze, Ava, DO  ?omeprazole (PRILOSEC OTC) 20 MG tablet Take 40 mg by mouth daily before breakfast.     [provider]  ?pembrolizumab (KEYTRUDA) 100 MG/4ML SOLN Inject into the vein. 01/07/21   [provider]  ?prochlorperazine (COMPAZINE) 10 MG tablet Take 1 tablet (10 mg total) by mouth every 6 (six) hours as needed for nausea or vomiting. 01/13/21   Wyatt Portela, MD  ?Sennosides [facility] EXTRA STRENGTH) 17.2 MG TABS Take 34.4 mg by mouth at bedtime.    [provider]  ?traMADol (ULTRAM) 50 MG tablet Take 50 mg by mouth every 12 (twelve) hours as needed (Pain).    [provider]  ?warfarin (COUMADIN) 3 MG tablet TAKE 1 TO 2 TABLETS BY MOUTH DAILY AS DIRECTED BY  THE  COUMADIN  CLINIC 08/12/21   Elouise Munroe, MD  ?   ? ?Allergies    ?Penicillins   ? ?Review of Systems   ?Review of Systems  ?Constitutional:  Negative for chills and fever.  ?Respiratory:  Positive for cough and shortness of breath.   ?Cardiovascular:  Positive for chest pain. Negative for leg swelling.  ?Gastrointestinal:  Negative for nausea and vomiting.  ?All other systems reviewed and are negative. ? ?Physical Exam ?Updated Vital  Signs ?BP (!) 119/59   Pulse 83   Temp 97.9 ?F (36.6 ?C) (Oral)   Resp 20   SpO2 97%  ?Physical Exam ?Vitals and nursing note reviewed.  ?Constitutional:   ?   General: He is not in acute distress. ?   Appearance: He is well-developed. He is diaphoretic. He is not ill-appearing or toxic-appearing.  ?HENT:  ?   Head: Normocephalic and atraumatic.  ?   Mouth/Throat:  ?   Mouth: Mucous membranes are moist.  ?   Pharynx: Oropharynx is clear. No pharyngeal swelling.  ?Eyes:  ?   Extraocular Movements: Extraocular movements intact.  ?   Conjunctiva/sclera: Conjunctivae normal.  ?   Pupils: Pupils are equal, round, and reactive to light.  ?Neck:  ?   Vascular: No JVD.  ?Cardiovascular:  ?   Rate  and Rhythm: Normal rate and regular rhythm.  ?   Pulses:     ?     Radial pulses are 2+ on the right side and 2+ on the left side.  ?     Dorsalis pedis pulses are 2+ on the right side and 2+ on the left side.  ?Pulmonary:  ?   Effort: Pulmonary effort is normal. No respiratory distress.  ?   Breath sounds: Normal breath sounds. No decreased breath sounds, wheezing or rales.  ?Chest:  ?   Chest wall: No mass or tenderness.  ?Abdominal:  ?   General: Bowel sounds are normal.  ?   Palpations: Abdomen is soft.  ?   Tenderness: There is no abdominal tenderness.  ?Musculoskeletal:     ?   General: Normal range of motion.  ?   Cervical back: Normal range of motion and neck supple.  ?   Right lower leg: No tenderness. No edema.  ?   Left lower leg: No tenderness. No edema.  ?Skin: ?   General: Skin is warm.  ?   Capillary Refill: Capillary refill takes less than 2 seconds.  ?Neurological:  ?   Mental Status: He is alert and oriented to person, place, and time.  ? ? ?ED Results / Procedures / Treatments   ?Labs ?(all labs ordered are listed, but only abnormal results are displayed) ?Labs Reviewed  ?BASIC METABOLIC PANEL - Abnormal; Notable for the following components:  ?    Result Value  ? Chloride 95 (*)   ? Glucose, Bld 107 (*)   ?  Creatinine, Ser 4.66 (*)   ? GFR, Estimated 13 (*)   ? All other components within normal limits  ?CBC - Abnormal; Notable for the following components:  ? RBC 3.49 (*)   ? Hemoglobin 11.3 (*)   ? HCT 34.0 (*)   ? A

## 2021-11-20 NOTE — ED Triage Notes (Signed)
Patient reports SOB, chest pain that radiates into his back x1 week. Decided to come to the ED today after dialysis.  ?

## 2021-11-20 NOTE — Discharge Instructions (Addendum)
Please return to the ED with any new or worsening signs or symptoms such as increased chest pain or shortness of breath ?Please follow-up with your cardiologist on Friday as we discussed ?Please pick up your antibiotic medication tomorrow.  You have already been given your first dose here tonight.  You have 13 doses remaining.  I want you to take this medication 2 times daily. ?Please read over the attached informational guide concerning shortness of breath in adult patients ?

## 2021-11-25 LAB — CULTURE, BLOOD (ROUTINE X 2)
Culture: NO GROWTH
Special Requests: ADEQUATE

## 2021-11-25 NOTE — Progress Notes (Signed)
?Cardiology Clinic Note  ? ?Patient Name: George Lewis. ?Date of Encounter: 11/26/2021 ? ?Primary Care Provider:  Lorrene Reid, PA-C ?Primary Cardiologist:  George Munroe, MD ? ?Patient Profile  ?  ?65 year old  male with a history of right renal cell carcinoma s/p nephrectomy in 9/19, ESRD on HD (TTS), history of pulmonary embolism, HTN, HLD, SVT, DM type II, OSA on CPAP, morbid obesity, and paroxysmal atrial fibrillation CHADS VASC Score of 3, on coumadin. 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  ? ?Past Medical History  ?  ?Past Medical History:  ?Diagnosis Date  ? Anemia   ? Arthritis   ? knees  ? Atrial fibrillation ([place])   ? Bilateral renal cysts 03/23/2018  ? Noted MRI ABD  ? Cancer [facility])   ? right kidney cancer  ? Cancer [facility])   ? pancreatic  ? Cholelithiasis 03/19/2018  ? noted on CT AB/Pelvis  ? CKD (chronic kidney disease), stage IV ([place])   ? nephrologist-- dr Hollie Salk [facility]);  05-01-2018 has not started dialysis, scheduled for AV fistula creation 05-07-2018  ? Diabetes mellitus without complication ([place])   ? Diverticulosis of colon 04/19/2018  ? Noted on CT abd/pelvis  ? Dysrhythmia   ? afib  ? GERD (gastroesophageal reflux disease)   ? Hepatic steatosis 03/19/2018  ? Mild diffuse, noted on CT AB/Pelvis  ? History of kidney stones   ? History of pulmonary embolus (PE) 03/2008  ? treated with coumadin for 6 months  ? History of sepsis 04/20/2018  ? per discharge note , probable UTI  ? Hypertension   ? IBS (irritable bowel syndrome)   ? Nocturia   ? OSA on CPAP   ? uses CPAP nightly  ? Pancreatic lesion   ? 0.4cm cystic per CT 07/ 2019  ? Peripheral vascular disease ([place])   ? Pneumonia   ? x 1  ? Pulmonary nodule   ? Solid 15m Right Lower Lobe  ? Right renal mass 04/10/2018  ? new dx--   scheduled for nephrectomy 05-16-2018  ? S/P ureteral stent placement: 04/16/2018 04/19/2018  ? ?Past Surgical History:  ?Procedure Laterality Date  ? AV FISTULA PLACEMENT Left 05/07/2018  ? Procedure: Creation of Left arm Radiocephalic Fistula;  Surgeon: George Heck MD;  [facility];  Service: Vascular;  Laterality: Left;  ? AV FISTULA PLACEMENT Left 05/15/2019  ? Procedure: Creation of Brachiocephalic fistula left arm;  Surgeon: George Sandy MD;  [place]  Service: Vascular;  Laterality: Left;  ? BASCILIC VEIN TRANSPOSITION Left 07/15/2019  ? Procedure: BASILIC VEIN TRANSPOSITION SECOND STAGE;  Surgeon: George Sandy MD;  [place]  Service: Vascular;  Laterality: Left;  ? BRONCHIAL BRUSHINGS  08/25/2020  ? Procedure: BRONCHIAL BRUSHINGS;  Surgeon: IGarner Nash DO;  [facility];  Service: Pulmonary;;  ? BRONCHIAL NEEDLE ASPIRATION BIOPSY  08/25/2020  ? Procedure: BRONCHIAL NEEDLE ASPIRATION BIOPSIES;  Surgeon: IGarner Nash DO;  [facility];  Service: Pulmonary;;  ? BRONCHIAL WASHINGS  08/25/2020  ? Procedure: BRONCHIAL WASHINGS;  Surgeon: IGarner Nash DO;  [facility];  Service: Pulmonary;;  ? COLONOSCOPY    ? CYSTOSCOPY/RETROGRADE/URETEROSCOPY/STONE EXTRACTION WITH BASKET  x2 last one 1990s approx.  ? CYSTOSCOPY/URETEROSCOPY/HOLMIUM LASER/STENT PLACEMENT Left 04/16/2018  ? Procedure: CYSTOSCOPY/LEFT URETEROSCOPY/LEFT RETROGRADE/STENT PLACEMENT;  Surgeon: George Mallow, MD;  [facility];  Service: Urology;  Laterality: Left;  ? EUS N/A 05/03/2018  ? Procedure: UPPER ENDOSCOPIC ULTRASOUND (EUS) RADIAL;  Surgeon: George Banister, MD;  [facility];  Service: Gastroenterology;  Laterality: N/A;  ? EUS N/A 05/03/2018  ? Procedure: UPPER ENDOSCOPIC ULTRASOUND (EUS) LINEAR;  Surgeon: George Banister, MD;  [facility];  Service: Gastroenterology;  Laterality: N/A;  ? EUS  10/05/2020  ? upper GI  ?  EXTRACORPOREAL SHOCK WAVE LITHOTRIPSY  x3  last one 2004 approx.  ? FINE NEEDLE ASPIRATION N/A 05/03/2018  ? Procedure: FINE NEEDLE ASPIRATION (FNA) LINEAR;  Surgeon: George Banister, MD;  [facility];  Service: Gastroenterology;  Laterality: N/A;  ? INTERCOSTAL NERVE BLOCK Left 10/16/2020  ? Procedure: INTERCOSTAL NERVE BLOCK;  Surgeon: George Nakayama, MD;  Location: [place];  Service: Thoracic;  Laterality: Left;  ? LAPAROSCOPIC NEPHRECTOMY, HAND ASSISTED Right 05/16/2018  ? Procedure: HAND ASSISTED LAPAROSCOPIC RIGHT NEPHRECTOMY;  Surgeon: George Mallow, MD;  [facility];  Service: Urology;  Laterality: Right;  ? left index finger attachment  1980s  ? 3-4 surgeries  ? NODE DISSECTION Left 10/16/2020  ? Procedure: NODE DISSECTION;  Surgeon: George Nakayama, MD;  Location: [place];  Service: Thoracic;  Laterality: Left;  ? TOE SURGERY  1980s  ? in beween 2nd and 3rd toes cyst removed  ? UPPER GI ENDOSCOPY    ? VIDEO BRONCHOSCOPY WITH ENDOBRONCHIAL NAVIGATION Bilateral 08/25/2020  ? Procedure: VIDEO BRONCHOSCOPY WITH ENDOBRONCHIAL NAVIGATION;  Surgeon: George Nash, DO;  Location: [place];  Service: Pulmonary;  Laterality: Bilateral;  ? ? ?Allergies ? ?Allergies  ?Allergen Reactions  ? Penicillins Rash and Other (See Comments)  ?  Has patient had a PCN reaction causing immediate rash, facial/tongue/throat swelling, SOB or lightheadedness with hypotension: yes ?Has patient had a PCN reaction causing severe rash involving mucus membranes or skin necrosis: no ?Has patient had a PCN reaction that required hospitalization: no ?Has patient had a PCN reaction occurring within the last 10 years: no ?If all of the above answers are "NO", then may proceed with Cephalosporin use. ?  ? ? ?History of Present Illness  ?  ?[name] presents today for ongoing assessment of  PAF, hypertension SVT. History as outlined above. Was seen in ED on 3/25;/2023 for chest pain an shortness of breath after  receiving dialysis treatment. He experienced left arm numbness and tingling, with chest pain "coming and going." CT angiogram showed suggestive of pneumonia. He was not treated with azithromycin due to too many drug interactions with current therapy for PAF. He was then started on Doxycycline after given 1 gn of Rocephin. He was discharged and was to follow up with planned dialysis.  ? ?He comes today with multiple somatic complaints.  He is frustrated because he is "feeling bad all the time".  He has been having to take midodrine during dialysis in order to keep his blood pressure elevated enough to complete.  He has chronic pain in his left lateral chest.  He continues on antibiotic therapy for pneumonia.  He is being followed by oncology and will be established with Dr. Alen Blew with [facility] on April 19.  He is being followed in our Coumadin clinic and has been seen today. ? ?Home Medications  ?  ?Current Outpatient Medications  ?Medication Sig Dispense Refill  ? Accu-Chek FastClix Lancets MISC Use to check fasting blood  sugar and 2 hrs after largest meal. 100 each 0  ? acetaminophen (TYLENOL) 500 MG tablet Take 1,000 mg by mouth every 8 (eight) hours as needed for mild pain or headache.    ? allopurinol (ZYLOPRIM) 100 MG tablet Take 1 tablet (100 mg total) by mouth every evening. 30 tablet 0  ? amiodarone (PACERONE) 200 MG tablet Take 1 tablet (200 mg total) by mouth every evening. 90 tablet 1  ? atorvastatin (LIPITOR) 40 MG tablet Take 1 tablet (40 mg total) by mouth every evening.    ? blood glucose meter kit and supplies Dispense based on patient and insurance preference. Use up to four times daily as directed. (FOR ICD-10 E10.9, E11.9). 1 each 0  ? diphenhydramine-acetaminophen (TYLENOL PM) 25-500 MG TABS tablet Take 2 tablets by mouth at bedtime as needed (sleep/headache).    ? doxycycline (VIBRAMYCIN) 100 MG capsule Take 1 capsule (100 mg total) by mouth 2 (two) times daily for 7 days. 13  capsule 0  ? ferric citrate (AURYXIA) 1 GM 210 MG(Fe) tablet Take 1 tablet (210 mg total) by mouth daily with supper. 270 tablet   ? fluticasone (FLONASE) 50 MCG/ACT nasal spray Place 1-2 sprays into both

## 2021-11-26 ENCOUNTER — Encounter: Payer: Self-pay | Admitting: Adult Health

## 2021-11-26 ENCOUNTER — Ambulatory Visit (INDEPENDENT_AMBULATORY_CARE_PROVIDER_SITE_OTHER): Payer: Medicare Other | Admitting: Adult Health

## 2021-11-26 ENCOUNTER — Ambulatory Visit (INDEPENDENT_AMBULATORY_CARE_PROVIDER_SITE_OTHER): Payer: Medicare Other

## 2021-11-26 VITALS — BP 90/50 | HR 120 | Ht 73.0 in | Wt 291.0 lb

## 2021-11-26 DIAGNOSIS — J189 Pneumonia, unspecified organism: Secondary | ICD-10-CM

## 2021-11-26 DIAGNOSIS — C641 Malignant neoplasm of right kidney, except renal pelvis: Secondary | ICD-10-CM

## 2021-11-26 DIAGNOSIS — I480 Paroxysmal atrial fibrillation: Secondary | ICD-10-CM | POA: Diagnosis not present

## 2021-11-26 DIAGNOSIS — N186 End stage renal disease: Secondary | ICD-10-CM

## 2021-11-26 DIAGNOSIS — Z7901 Long term (current) use of anticoagulants: Secondary | ICD-10-CM

## 2021-11-26 DIAGNOSIS — Z992 Dependence on renal dialysis: Secondary | ICD-10-CM

## 2021-11-26 DIAGNOSIS — E7800 Pure hypercholesterolemia, unspecified: Secondary | ICD-10-CM

## 2021-11-26 DIAGNOSIS — R072 Precordial pain: Secondary | ICD-10-CM | POA: Diagnosis not present

## 2021-11-26 DIAGNOSIS — E861 Hypovolemia: Secondary | ICD-10-CM

## 2021-11-26 LAB — POCT INR: INR: 1.8 — AB (ref 2.0–3.0)

## 2021-11-26 NOTE — Patient Instructions (Signed)
TAKE 1 TABLET TONIGHT ONLY and then continue taking 1 tablet daily except 0.5 tablet on Mondays and Fridays. Repeat INR in 3 weeks. Coumadin Clinic [phone] ?

## 2021-11-26 NOTE — Patient Instructions (Signed)
Medication Instructions:  ?No Changes ?*If you need a refill on your cardiac medications before your next appointment, please call your pharmacy* ? ? ?Lab Work: ?No Labs ?If you have labs (blood work) drawn today and your tests are completely normal, you will receive your results only by: ?MyChart Message (if you have MyChart) OR ?A paper copy in the mail ?If you have any lab test that is abnormal or we need to change your treatment, we will call you to review the results. ? ? ?Testing/Procedures: ?No Testing ? ? ?Follow-Up: ?At [facility], you and your health needs are our priority.  As part of our continuing mission to provide you with exceptional heart care, we have created designated Provider Care Teams.  These Care Teams include your primary Cardiologist (physician) and Advanced Practice Providers (APPs -  Physician Assistants and Nurse Practitioners) who all work together to provide you with the care you need, when you need it. ? ?We recommend signing up for the patient portal called "MyChart".  Sign up information is provided on this After Visit Summary.  MyChart is used to connect with patients for Virtual Visits (Telemedicine).  Patients are able to view lab/test results, encounter notes, upcoming appointments, etc.  Non-urgent messages can be sent to your provider as well.   ?To learn more about what you can do with MyChart, go to [email or web address].   ? ?Your next appointment:   ?3 month(s) ? ?The format for your next appointment:   ?In Person ? ?Provider:   ?Elouise Munroe, MD   ? ? ?  ?

## 2021-12-03 ENCOUNTER — Telehealth: Payer: Self-pay | Admitting: Internal Medicine

## 2021-12-03 ENCOUNTER — Other Ambulatory Visit: Payer: Self-pay

## 2021-12-03 MED ORDER — MIDODRINE HCL 10 MG PO TABS
10.0000 mg | ORAL_TABLET | Freq: Three times a day (TID) | ORAL | 3 refills | Status: AC | PRN
Start: 2021-12-03 — End: ?

## 2021-12-03 NOTE — Telephone Encounter (Signed)
?*  STAT* If patient is at the pharmacy, call can be transferred to refill team. ? ? ?1. Which medications need to be refilled? (please list name of each medication and dose if known) midodrine (PROAMATINE) 10 MG tablet ? ?2. Which pharmacy/location (including street and city if local pharmacy) is medication to be sent to? [place], [place] ? ?3. Do they need a 30 day or 90 day supply? 90 day  ? ?Patient only has one day left.  ?

## 2021-12-03 NOTE — Telephone Encounter (Signed)
Refill sent to pharmacy.   

## 2021-12-13 ENCOUNTER — Ambulatory Visit (HOSPITAL_COMMUNITY)
Admission: RE | Admit: 2021-12-13 | Discharge: 2021-12-13 | Disposition: A | Discharge status: Home / Self Care | Payer: Medicare Other | Source: Ambulatory Visit | Attending: Oncology | Admitting: Oncology

## 2021-12-13 ENCOUNTER — Ambulatory Visit: Payer: Medicare Other | Admitting: Internal Medicine

## 2021-12-13 DIAGNOSIS — C641 Malignant neoplasm of right kidney, except renal pelvis: Secondary | ICD-10-CM | POA: Insufficient documentation

## 2021-12-14 ENCOUNTER — Ambulatory Visit (INDEPENDENT_AMBULATORY_CARE_PROVIDER_SITE_OTHER): Payer: Medicare Other

## 2021-12-14 DIAGNOSIS — Z7901 Long term (current) use of anticoagulants: Secondary | ICD-10-CM | POA: Diagnosis not present

## 2021-12-14 DIAGNOSIS — I480 Paroxysmal atrial fibrillation: Secondary | ICD-10-CM

## 2021-12-14 LAB — POCT INR: INR: 2.2 (ref 2.0–3.0)

## 2021-12-14 NOTE — Patient Instructions (Signed)
Description   ?Continue taking 1 tablet daily except 0.5 tablet on Mondays and Fridays.  ?Repeat INR in 4 weeks.  ?Coumadin Clinic [phone] ?  ?   ?

## 2021-12-15 ENCOUNTER — Other Ambulatory Visit: Payer: Self-pay

## 2021-12-15 ENCOUNTER — Inpatient Hospital Stay: Payer: Medicare Other

## 2021-12-15 ENCOUNTER — Telehealth: Payer: Self-pay | Admitting: *Deleted

## 2021-12-15 ENCOUNTER — Inpatient Hospital Stay (HOSPITAL_BASED_OUTPATIENT_CLINIC_OR_DEPARTMENT_OTHER): Payer: Medicare Other | Admitting: Oncology

## 2021-12-15 ENCOUNTER — Inpatient Hospital Stay: Payer: Medicare Other | Attending: Oncology

## 2021-12-15 VITALS — BP 96/66 | HR 127 | Temp 98.4°F | Resp 17 | Wt 294.4 lb

## 2021-12-15 DIAGNOSIS — Z79899 Other long term (current) drug therapy: Secondary | ICD-10-CM | POA: Diagnosis not present

## 2021-12-15 DIAGNOSIS — C7802 Secondary malignant neoplasm of left lung: Secondary | ICD-10-CM | POA: Diagnosis not present

## 2021-12-15 DIAGNOSIS — R109 Unspecified abdominal pain: Secondary | ICD-10-CM | POA: Insufficient documentation

## 2021-12-15 DIAGNOSIS — N186 End stage renal disease: Secondary | ICD-10-CM | POA: Diagnosis not present

## 2021-12-15 DIAGNOSIS — C7801 Secondary malignant neoplasm of right lung: Secondary | ICD-10-CM | POA: Insufficient documentation

## 2021-12-15 DIAGNOSIS — C649 Malignant neoplasm of unspecified kidney, except renal pelvis: Secondary | ICD-10-CM | POA: Insufficient documentation

## 2021-12-15 DIAGNOSIS — R000 Tachycardia, unspecified: Secondary | ICD-10-CM | POA: Insufficient documentation

## 2021-12-15 DIAGNOSIS — J189 Pneumonia, unspecified organism: Secondary | ICD-10-CM | POA: Diagnosis not present

## 2021-12-15 DIAGNOSIS — C641 Malignant neoplasm of right kidney, except renal pelvis: Secondary | ICD-10-CM

## 2021-12-15 DIAGNOSIS — Z992 Dependence on renal dialysis: Secondary | ICD-10-CM | POA: Diagnosis not present

## 2021-12-15 DIAGNOSIS — Z5112 Encounter for antineoplastic immunotherapy: Secondary | ICD-10-CM | POA: Diagnosis present

## 2021-12-15 DIAGNOSIS — E032 Hypothyroidism due to medicaments and other exogenous substances: Secondary | ICD-10-CM

## 2021-12-15 LAB — CBC WITH DIFFERENTIAL (CANCER CENTER ONLY)
Abs Immature Granulocytes: 0.26 10*3/uL — ABNORMAL HIGH (ref 0.00–0.07)
Basophils Absolute: 0.1 10*3/uL (ref 0.0–0.1)
Basophils Relative: 1 %
Eosinophils Absolute: 0.3 10*3/uL (ref 0.0–0.5)
Eosinophils Relative: 3 %
HCT: 33.7 % — ABNORMAL LOW (ref 39.0–52.0)
Hemoglobin: 11.3 g/dL — ABNORMAL LOW (ref 13.0–17.0)
Immature Granulocytes: 3 %
Lymphocytes Relative: 15 %
Lymphs Abs: 1.5 10*3/uL (ref 0.7–4.0)
MCH: 32.4 pg (ref 26.0–34.0)
MCHC: 33.5 g/dL (ref 30.0–36.0)
MCV: 96.6 fL (ref 80.0–100.0)
Monocytes Absolute: 0.7 10*3/uL (ref 0.1–1.0)
Monocytes Relative: 7 %
Neutro Abs: 7.4 10*3/uL (ref 1.7–7.7)
Neutrophils Relative %: 71 %
Platelet Count: 285 10*3/uL (ref 150–400)
RBC: 3.49 MIL/uL — ABNORMAL LOW (ref 4.22–5.81)
RDW: 14.6 % (ref 11.5–15.5)
WBC Count: 10.2 10*3/uL (ref 4.0–10.5)
nRBC: 0 % (ref 0.0–0.2)

## 2021-12-15 LAB — CMP (CANCER CENTER ONLY)
ALT: 14 U/L (ref 0–44)
AST: 23 U/L (ref 15–41)
Albumin: 3.9 g/dL (ref 3.5–5.0)
Alkaline Phosphatase: 42 U/L (ref 38–126)
Anion gap: 17 — ABNORMAL HIGH (ref 5–15)
BUN: 31 mg/dL — ABNORMAL HIGH (ref 8–23)
CO2: 23 mmol/L (ref 22–32)
Calcium: 9.1 mg/dL (ref 8.9–10.3)
Chloride: 98 mmol/L (ref 98–111)
Creatinine: 7.45 mg/dL (ref 0.61–1.24)
GFR, Estimated: 8 mL/min — ABNORMAL LOW (ref >60)
Glucose, Bld: 144 mg/dL — ABNORMAL HIGH (ref 70–99)
Potassium: 4 mmol/L (ref 3.5–5.1)
Sodium: 138 mmol/L (ref 135–145)
Total Bilirubin: 0.4 mg/dL (ref 0.3–1.2)
Total Protein: 7.5 g/dL (ref 6.5–8.1)

## 2021-12-15 LAB — TSH: TSH: 2.291 u[IU]/mL (ref 0.350–4.500)

## 2021-12-15 MED ORDER — SODIUM CHLORIDE 0.9 % IV SOLN
400.0000 mg | Freq: Once | INTRAVENOUS | Status: AC
  Administered 2021-12-15: 400 mg via INTRAVENOUS
  Filled 2021-12-15: qty 16

## 2021-12-15 MED ORDER — SODIUM CHLORIDE 0.9 % IV SOLN: Freq: Once | INTRAVENOUS | Status: AC

## 2021-12-15 MED ORDER — TRAMADOL HCL 50 MG PO TABS: 50.0000 mg | ORAL_TABLET | Freq: Four times a day (QID) | ORAL | 1 refills | Status: DC | PRN

## 2021-12-15 NOTE — Telephone Encounter (Signed)
CRITICAL VALUE STICKER ? ?CRITICAL VALUE: Creatnine 7.45 ? ?RECEIVER (on-site recipient of call):J. Corinna Capra RN ? ?DATE & TIME NOTIFIED: 12/15/21 @ 1212 ? ?MESSENGER (representative from lab): Rhonda ? ?MD NOTIFIED: Dr Alen Blew ? ?TIME OF NOTIFICATION:1215 ? ?RESPONSE:  MD aware ?

## 2021-12-15 NOTE — Progress Notes (Signed)
Per Dr. Alen Blew, ok to treat with elevated heart rate and creat 7.45mg /dL ?

## 2021-12-15 NOTE — Progress Notes (Signed)
Hematology and Oncology Follow Up Visit ? ?George Lewis. ?[id number] ?03/03/1956 66 y.o. ?12/15/2021 11:35 AM ?George Oka, PA-C  ? 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  He initially presented with localized disease in 2019. ? ? ?Prior Therapy: ? ?He underwent laparoscopic radical nephrectomy completed on May 16, 2018 under the care of Dr. Gloriann Loan.  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  Histological grade 4.  ? ?He underwent robotic assisted left upper lobe wedge resection completed by Dr. Roxan Hockey on October 16, 2020.  The final pathology showed clear-cell renal cell carcinoma with negative margins.   ? ?He is status post radiation therapy to the left upper lobe and the right lower lobe pulmonary nodules.  October 2022. ? ? ?Current therapy: Pembrolizumab 400 mg every 6 weeks started in Jan 13, 2021.  He is here for cycle 8 of therapy. ? ?Interim History: [name] presents today for a follow-up visit.  Since the last visit, he reports no major changes in his health.  He has developed symptoms of shortness of breath and dyspnea and was seen in the emergency department on November 20, 2021.  CT scan ruled out pulmonary embolism was diagnosed with pneumonia and currently finishing a course of antibiotics.  He still slightly dyspneic but overall no other complaints.  His performance status quality of life remains unchanged.  He has no other complications related to Pembrolizumab. ? ? ?He continues to have issues with pain related to his surgical sites.  He has used tramadol in the past which has been successful. ? ? ? ?Medications: Reviewed without changes. ?Current Outpatient Medications  ?Medication Sig Dispense Refill  ? Accu-Chek FastClix Lancets MISC  Use to check fasting blood sugar and 2 hrs after largest meal. 100 each 0  ? acetaminophen (TYLENOL) 500 MG tablet Take 1,000 mg by mouth every 8 (eight) hours as needed for mild pain or headache.    ? allopurinol (ZYLOPRIM) 100 MG tablet Take 1 tablet (100 mg total) by mouth every evening. 30 tablet 0  ? amiodarone (PACERONE) 200 MG tablet Take 1 tablet (200 mg total) by mouth every evening. 90 tablet 1  ? atorvastatin (LIPITOR) 40 MG tablet Take 1 tablet (40 mg total) by mouth every evening.    ? blood glucose meter kit and supplies Dispense based on patient and insurance preference. Use up to four times daily as directed. (FOR ICD-10 E10.9, E11.9). 1 each 0  ? diphenhydramine-acetaminophen (TYLENOL PM) 25-500 MG TABS tablet Take 2 tablets by mouth at bedtime as needed (sleep/headache).    ? ferric citrate (AURYXIA) 1 GM 210 MG(Fe) tablet Take 1 tablet (210 mg total) by mouth daily with supper. 270 tablet   ? fluticasone (FLONASE) 50 MCG/ACT nasal spray Place 1-2 sprays into both nostrils daily as needed for allergies or rhinitis.    ? gabapentin (NEURONTIN) 100 MG capsule Take 1 capsule (100 mg total) by mouth 2 (two) times daily. Take 1 PO at night for 5 days then go to twice daily 60 capsule 2  ? glucose blood (ACCU-CHEK GUIDE) test strip USE 1 STRIP TO CHECK FASTING BLOOD SUGAR AND 2 HOURS AFTER LARGEST MEAL 100 each 0  ? insulin glargine (LANTUS) 100 UNIT/ML injection Inject into the skin daily as needed (low blood glucose 180). Unknow dose to give  if needed    ? lidocaine-prilocaine (EMLA) cream Apply topically.    ? loratadine (CLARITIN) 10 MG tablet Take 10 mg by mouth every evening.     ? midodrine (PROAMATINE) 10 MG tablet Take 1 tablet (10 mg total) by mouth 3 (three) times daily as needed (for hypotension/dialysis). AS ordered. Take In the evening 10 mg on Mon. Wed and Friday Take 20 mg on Dialysis days Tues, Thurs and Saturday. 10 mg when he gets there and 10 mg half way through. 270 tablet 3  ?  nitroGLYCERIN (NITROSTAT) 0.4 MG SL tablet Place 1 tablet (0.4 mg total) under the tongue every 5 (five) minutes as needed for chest pain. 14 tablet 12  ? omeprazole (PRILOSEC OTC) 20 MG tablet Take 40 mg by mouth daily before breakfast.     ? pembrolizumab (KEYTRUDA) 100 MG/4ML SOLN Inject into the vein.    ? prochlorperazine (COMPAZINE) 10 MG tablet Take 1 tablet (10 mg total) by mouth every 6 (six) hours as needed for nausea or vomiting. 30 tablet 0  ? Sennosides (SENOKOT EXTRA STRENGTH) 17.2 MG TABS Take 34.4 mg by mouth at bedtime.    ? traMADol (ULTRAM) 50 MG tablet Take 50 mg by mouth every 12 (twelve) hours as needed (Pain).    ? warfarin (COUMADIN) 3 MG tablet TAKE 1 TO 2 TABLETS BY MOUTH DAILY AS DIRECTED BY  THE  COUMADIN  CLINIC 60 tablet 5  ? ?No current facility-administered medications for this visit.  ? ? ? ?Allergies:  ?Allergies  ?Allergen Reactions  ? Penicillins Rash and Other (See Comments)  ?  Has patient had a PCN reaction causing immediate rash, facial/tongue/throat swelling, SOB or lightheadedness with hypotension: yes ?Has patient had a PCN reaction causing severe rash involving mucus membranes or skin necrosis: no ?Has patient had a PCN reaction that required hospitalization: no ?Has patient had a PCN reaction occurring within the last 10 years: no ?If all of the above answers are "NO", then may proceed with Cephalosporin use. ?  ? ? ? ? ?Physical Exam: ? ? ?Blood pressure 96/66, pulse (!) 127, temperature 98.4 ?F (36.9 ?C), temperature source Tympanic, resp. rate 17, weight 294 lb 6 oz (133.5 kg), SpO2 100 %. ? ? ? ? ? ?ECOG: 1 ? ? ?General appearance: Comfortable appearing without any discomfort ?Head: Normocephalic without any trauma ?Oropharynx: Mucous membranes are moist and pink without any thrush or ulcers. ?Eyes: Pupils are equal and round reactive to light. ?Lymph nodes: No cervical, supraclavicular, inguinal or axillary lymphadenopathy.   ?Heart:regular rate and rhythm.  S1 and  S2 without leg edema. ?Lung: Clear without any rhonchi or wheezes.  No dullness to percussion. ?Abdomin: Soft, nontender, nondistended with good bowel sounds.  No hepatosplenomegaly. ?Musculoskeletal: No joint deformity or effusion.  Full range of motion noted. ?Neurological: No deficits noted on motor, sensory and deep tendon reflex exam. ?Skin: No petechial rash or dryness.  Appeared moist.  ? ? ? ? ? ? ? ? ? ?Lab Results: ?Lab Results  ?Component Value Date  ? WBC 9.7 11/20/2021  ? HGB 11.3 (L) 11/20/2021  ? HCT 34.0 (L) 11/20/2021  ? MCV 97.4 11/20/2021  ? PLT 303 11/20/2021  ? ?  Chemistry   ?   ?Component Value Date/Time  ? NA 136 11/20/2021 1232  ? NA 139 04/02/2021 0908  ? K 3.8 11/20/2021 1232  ? CL 95 (L) 11/20/2021 1232  ? CO2 28 11/20/2021 1232  ? BUN 17 11/20/2021 1232  ?  BUN 29 (H) 04/02/2021 0908  ? CREATININE 4.66 (H) 11/20/2021 1232  ? CREATININE 8.55 (HH) 11/03/2021 1110  ? GLU 96 03/14/2019 0000  ?    ?Component Value Date/Time  ? CALCIUM 9.3 11/20/2021 1232  ? ALKPHOS 55 11/03/2021 1110  ? AST 14 (L) 11/03/2021 1110  ? ALT 10 11/03/2021 1110  ? BILITOT 0.4 11/03/2021 1110  ?  ? ?1. Stable bowel bilateral pulmonary nodular metastasis. ?2. Stable loculated pleural fluid and atelectasis at the LEFT lung ?base. ?3. No evidence disease progression. ?4. Coronary artery calcification and Aortic Atherosclerosis ?(ICD10-I70.0). ?  ?Abdomen / Pelvis Impression: ?  ?1. No evidence of metastatic disease in the abdomen pelvis. ?2. Post RIGHT nephrectomy without evidence of local recurrence ?3. LEFT kidney unchanged. ?  ?  ? ? ?Impression and Plan: ? ? ? ?1.  Kidney cancer diagnosed in 2019.  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 ?  ?Imaging studies obtained on December 13, 2021 was personally reviewed and showed no evidence of traumatic changes or progression of disease.  He continues to tolerate the current treatment without any complaints.  Risks and benefits  of continuing this were reviewed.  Potential complications that include immune mediated issues as well as GI toxicity were reiterated.  He is agreeable to continuing we will repeat imaging studies in the next

## 2021-12-15 NOTE — Patient Instructions (Signed)
[facility]  Discharge Instructions: ?Thank you for choosing [facility] to provide your oncology and hematology care.  ? ?If you have a lab appointment with the [facility], please go directly to the [facility] and check in at the registration area. ?  ?Wear comfortable clothing and clothing appropriate for easy access to any Portacath or PICC line.  ? ?We strive to give you quality time with your provider. You may need to reschedule your appointment if you arrive late (15 or more minutes).  Arriving late affects you and other patients whose appointments are after yours.  Also, if you miss three or more appointments without notifying the office, you may be dismissed from the clinic at the provider?s discretion.    ?  ?For prescription refill requests, have your pharmacy contact our office and allow 72 hours for refills to be completed.   ? ?Today you received the following chemotherapy and/or immunotherapy agent: Keytruda    ?  ?To help prevent nausea and vomiting after your treatment, we encourage you to take your nausea medication as directed. ? ?BELOW ARE SYMPTOMS THAT SHOULD BE REPORTED IMMEDIATELY: ?*FEVER GREATER THAN 100.4 F (38 ?C) OR HIGHER ?*CHILLS OR SWEATING ?*NAUSEA AND VOMITING THAT IS NOT CONTROLLED WITH YOUR NAUSEA MEDICATION ?*UNUSUAL SHORTNESS OF BREATH ?*UNUSUAL BRUISING OR BLEEDING ?*URINARY PROBLEMS (pain or burning when urinating, or frequent urination) ?*BOWEL PROBLEMS (unusual diarrhea, constipation, pain near the anus) ?TENDERNESS IN MOUTH AND THROAT WITH OR WITHOUT PRESENCE OF ULCERS (sore throat, sores in mouth, or a toothache) ?UNUSUAL RASH, SWELLING OR PAIN  ?UNUSUAL VAGINAL DISCHARGE OR ITCHING  ? ?Items with * indicate a potential emergency and should be followed up as soon as possible or go to the Emergency Department if any problems should occur. ? ?Please show the CHEMOTHERAPY ALERT CARD or IMMUNOTHERAPY ALERT CARD at check-in to  the Emergency Department and triage nurse. ? ?Should you have questions after your visit or need to cancel or reschedule your appointment, please contact [facility]  Dept: [phone]  and follow the prompts.  Office hours are 8:00 a.m. to 4:30 p.m. Monday - Friday. Please note that voicemails left after 4:00 p.m. may not be returned until the following business day.  We are closed weekends and major holidays. You have access to a nurse at all times for urgent questions. Please call the main number to the clinic Dept: [phone] and follow the prompts. ? ? ?For any non-urgent questions, you may also contact your provider using MyChart. We now offer e-Visits for anyone 13 and older to request care online for non-urgent symptoms. For details visit [email or web address]. ?  ?Also download the MyChart app! Go to the app store, search "MyChart", open the app, select [facility], and log in with your MyChart username and password. ? ?Due to Covid, a mask is required upon entering the hospital/clinic. If you do not have a mask, one will be given to you upon arrival. For doctor visits, patients may have 1 support person aged 3 or older with them. For treatment visits, patients cannot have anyone with them due to current Covid guidelines and our immunocompromised population.  ? ?

## 2022-01-11 ENCOUNTER — Ambulatory Visit (INDEPENDENT_AMBULATORY_CARE_PROVIDER_SITE_OTHER): Payer: Medicare Other

## 2022-01-11 DIAGNOSIS — Z5181 Encounter for therapeutic drug level monitoring: Secondary | ICD-10-CM

## 2022-01-11 DIAGNOSIS — I480 Paroxysmal atrial fibrillation: Secondary | ICD-10-CM | POA: Diagnosis not present

## 2022-01-11 LAB — POCT INR: INR: 1.9 — AB (ref 2.0–3.0)

## 2022-01-11 NOTE — Patient Instructions (Signed)
Description   ?Take 1.5 tablets today and then continue taking 1 tablet daily except 0.5 tablet on Mondays and Fridays.  ?Repeat INR in 4 weeks.  ?Coumadin Clinic [phone] ?  ?   ?

## 2022-01-26 ENCOUNTER — Inpatient Hospital Stay: Payer: Medicare Other

## 2022-01-26 ENCOUNTER — Inpatient Hospital Stay: Payer: Medicare Other | Attending: Oncology

## 2022-01-26 ENCOUNTER — Inpatient Hospital Stay (HOSPITAL_BASED_OUTPATIENT_CLINIC_OR_DEPARTMENT_OTHER): Payer: Medicare Other | Admitting: Oncology

## 2022-01-26 ENCOUNTER — Other Ambulatory Visit: Payer: Self-pay

## 2022-01-26 VITALS — BP 99/67 | HR 117 | Temp 97.9°F | Resp 17 | Wt 293.9 lb

## 2022-01-26 DIAGNOSIS — R109 Unspecified abdominal pain: Secondary | ICD-10-CM | POA: Diagnosis not present

## 2022-01-26 DIAGNOSIS — C649 Malignant neoplasm of unspecified kidney, except renal pelvis: Secondary | ICD-10-CM | POA: Diagnosis not present

## 2022-01-26 DIAGNOSIS — R000 Tachycardia, unspecified: Secondary | ICD-10-CM | POA: Insufficient documentation

## 2022-01-26 DIAGNOSIS — Z5112 Encounter for antineoplastic immunotherapy: Secondary | ICD-10-CM | POA: Diagnosis present

## 2022-01-26 DIAGNOSIS — Z79899 Other long term (current) drug therapy: Secondary | ICD-10-CM | POA: Insufficient documentation

## 2022-01-26 DIAGNOSIS — C641 Malignant neoplasm of right kidney, except renal pelvis: Secondary | ICD-10-CM

## 2022-01-26 DIAGNOSIS — R0602 Shortness of breath: Secondary | ICD-10-CM | POA: Insufficient documentation

## 2022-01-26 DIAGNOSIS — N186 End stage renal disease: Secondary | ICD-10-CM | POA: Insufficient documentation

## 2022-01-26 DIAGNOSIS — E032 Hypothyroidism due to medicaments and other exogenous substances: Secondary | ICD-10-CM

## 2022-01-26 DIAGNOSIS — C7800 Secondary malignant neoplasm of unspecified lung: Secondary | ICD-10-CM | POA: Insufficient documentation

## 2022-01-26 LAB — CBC WITH DIFFERENTIAL (CANCER CENTER ONLY)
Abs Immature Granulocytes: 0.17 10*3/uL — ABNORMAL HIGH (ref 0.00–0.07)
Basophils Absolute: 0.1 10*3/uL (ref 0.0–0.1)
Basophils Relative: 1 %
Eosinophils Absolute: 0.4 10*3/uL (ref 0.0–0.5)
Eosinophils Relative: 4 %
HCT: 36.3 % — ABNORMAL LOW (ref 39.0–52.0)
Hemoglobin: 12.5 g/dL — ABNORMAL LOW (ref 13.0–17.0)
Immature Granulocytes: 2 %
Lymphocytes Relative: 16 %
Lymphs Abs: 1.6 10*3/uL (ref 0.7–4.0)
MCH: 32.6 pg (ref 26.0–34.0)
MCHC: 34.4 g/dL (ref 30.0–36.0)
MCV: 94.8 fL (ref 80.0–100.0)
Monocytes Absolute: 0.9 10*3/uL (ref 0.1–1.0)
Monocytes Relative: 9 %
Neutro Abs: 6.8 10*3/uL (ref 1.7–7.7)
Neutrophils Relative %: 68 %
Platelet Count: 298 10*3/uL (ref 150–400)
RBC: 3.83 MIL/uL — ABNORMAL LOW (ref 4.22–5.81)
RDW: 14.3 % (ref 11.5–15.5)
WBC Count: 10 10*3/uL (ref 4.0–10.5)
nRBC: 0 % (ref 0.0–0.2)

## 2022-01-26 LAB — CMP (CANCER CENTER ONLY)
ALT: 18 U/L (ref 0–44)
AST: 21 U/L (ref 15–41)
Albumin: 4.4 g/dL (ref 3.5–5.0)
Alkaline Phosphatase: 51 U/L (ref 38–126)
Anion gap: 14 (ref 5–15)
BUN: 38 mg/dL — ABNORMAL HIGH (ref 8–23)
CO2: 27 mmol/L (ref 22–32)
Calcium: 9.6 mg/dL (ref 8.9–10.3)
Chloride: 95 mmol/L — ABNORMAL LOW (ref 98–111)
Creatinine: 7.85 mg/dL (ref 0.61–1.24)
GFR, Estimated: 7 mL/min — ABNORMAL LOW (ref >60)
Glucose, Bld: 116 mg/dL — ABNORMAL HIGH (ref 70–99)
Potassium: 4 mmol/L (ref 3.5–5.1)
Sodium: 136 mmol/L (ref 135–145)
Total Bilirubin: 0.4 mg/dL (ref 0.3–1.2)
Total Protein: 8.1 g/dL (ref 6.5–8.1)

## 2022-01-26 LAB — TSH: TSH: 2.537 u[IU]/mL (ref 0.350–4.500)

## 2022-01-26 MED ORDER — SODIUM CHLORIDE 0.9 % IV SOLN
400.0000 mg | Freq: Once | INTRAVENOUS | Status: AC
  Administered 2022-01-26: 400 mg via INTRAVENOUS
  Filled 2022-01-26: qty 16

## 2022-01-26 MED ORDER — SODIUM CHLORIDE 0.9 % IV SOLN: Freq: Once | INTRAVENOUS | Status: AC

## 2022-01-26 NOTE — Progress Notes (Signed)
CRITICAL VALUE STICKER  CRITICAL VALUE: Creatnine 7.85  RECEIVER (on-site recipient of call): Velna Ochs RN  DATE & TIME NOTIFIED: 01/26/22 @ 1307  MESSENGER (representative from lab): Verdis Frederickson  MD NOTIFIED: Dr Alen Blew  TIME OF NOTIFICATION: 3646  RESPONSE:  MD aware

## 2022-01-26 NOTE — Progress Notes (Signed)
Per Dr. Alen Blew, ok to treat with creatnine of 7.85  & tachycardia today.

## 2022-01-26 NOTE — Patient Instructions (Signed)
[facility]  Discharge Instructions: ?Thank you for choosing [facility] to provide your oncology and hematology care.  ? ?If you have a lab appointment with the Cancer Center, please go directly to the Cancer Center and check in at the registration area. ?  ?Wear comfortable clothing and clothing appropriate for easy access to any Portacath or PICC line.  ? ?We strive to give you quality time with your provider. You may need to reschedule your appointment if you arrive late (15 or more minutes).  Arriving late affects you and other patients whose appointments are after yours.  Also, if you miss three or more appointments without notifying the office, you may be dismissed from the clinic at the provider?s discretion.    ?  ?For prescription refill requests, have your pharmacy contact our office and allow 72 hours for refills to be completed.   ? ?Today you received the following chemotherapy and/or immunotherapy agents: Keytruda ?  ?To help prevent nausea and vomiting after your treatment, we encourage you to take your nausea medication as directed. ? ?BELOW ARE SYMPTOMS THAT SHOULD BE REPORTED IMMEDIATELY: ?*FEVER GREATER THAN 100.4 F (38 ?C) OR HIGHER ?*CHILLS OR SWEATING ?*NAUSEA AND VOMITING THAT IS NOT CONTROLLED WITH YOUR NAUSEA MEDICATION ?*UNUSUAL SHORTNESS OF BREATH ?*UNUSUAL BRUISING OR BLEEDING ?*URINARY PROBLEMS (pain or burning when urinating, or frequent urination) ?*BOWEL PROBLEMS (unusual diarrhea, constipation, pain near the anus) ?TENDERNESS IN MOUTH AND THROAT WITH OR WITHOUT PRESENCE OF ULCERS (sore throat, sores in mouth, or a toothache) ?UNUSUAL RASH, SWELLING OR PAIN  ?UNUSUAL VAGINAL DISCHARGE OR ITCHING  ? ?Items with * indicate a potential emergency and should be followed up as soon as possible or go to the Emergency Department if any problems should occur. ? ?Please show the CHEMOTHERAPY ALERT CARD or IMMUNOTHERAPY ALERT CARD at check-in to the  Emergency Department and triage nurse. ? ?Should you have questions after your visit or need to cancel or reschedule your appointment, please contact [facility]  Dept: [phone]  and follow the prompts.  Office hours are 8:00 a.m. to 4:30 p.m. Monday - Friday. Please note that voicemails left after 4:00 p.m. may not be returned until the following business day.  We are closed weekends and major holidays. You have access to a nurse at all times for urgent questions. Please call the main number to the clinic Dept: [phone] and follow the prompts. ? ? ?For any non-urgent questions, you may also contact your provider using MyChart. We now offer e-Visits for anyone 18 and older to request care online for non-urgent symptoms. For details visit [email or web address]. ?  ?Also download the MyChart app! Go to the app store, search "MyChart", open the app, select [facility], and log in with your MyChart username and password. ? ?Due to Covid, a mask is required upon entering the hospital/clinic. If you do not have a mask, one will be given to you upon arrival. For doctor visits, patients may have 1 support person aged 18 or older with them. For treatment visits, patients cannot have anyone with them due to current Covid guidelines and our immunocompromised population.  ? ?

## 2022-01-26 NOTE — Progress Notes (Signed)
Hematology and Oncology Follow Up Visit  [place] [id number] 03-27-1956 66 y.o. 01/26/2022 12:21 PM [name], [place]   Principle Diagnosis: 66 year old man with kidney cancer diagnosed in February 2022.  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  He initially presented with localized disease in 2019.   Prior Therapy:  He underwent laparoscopic radical nephrectomy completed on May 16, 2018 under the care of Dr. Gloriann Loan.  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  Histological grade 4.   He underwent robotic assisted left upper lobe wedge resection completed by Dr. Roxan Hockey on October 16, 2020.  The final pathology showed clear-cell renal cell carcinoma with negative margins.    He is status post radiation therapy to the left upper lobe and the right lower lobe pulmonary nodules.  October 2022.   Current therapy: Pembrolizumab 400 mg every 6 weeks started in Jan 13, 2021.  He is here for cycle 9 of therapy.  Interim History: Mr. Schalk returns today for repeat evaluation.  Since last visit, he reports no major changes in his health.  He has tolerated Pembrolizumab without any major complaints.  He denies any nausea, vomiting or abdominal pain.  He denies shortness of breath or difficulty breathing.  He reported his respiratory status is improved with increased fluid removal at dialysis.  Pain is manageable with Ultram as well.    Medications: Updated on review. Current Outpatient Medications  Medication Sig Dispense Refill   Accu-Chek FastClix Lancets MISC Use to check fasting blood sugar and 2 hrs after largest meal. 100 each 0   acetaminophen (TYLENOL) 500 MG tablet Take 1,000 mg by mouth every 8 (eight) hours as needed for mild pain or headache.     allopurinol  (ZYLOPRIM) 100 MG tablet Take 1 tablet (100 mg total) by mouth every evening. 30 tablet 0   amiodarone (PACERONE) 200 MG tablet Take 1 tablet (200 mg total) by mouth every evening. 90 tablet 1   atorvastatin (LIPITOR) 40 MG tablet Take 1 tablet (40 mg total) by mouth every evening.     blood glucose meter kit and supplies Dispense based on patient and insurance preference. Use up to four times daily as directed. (FOR ICD-10 E10.9, E11.9). 1 each 0   diphenhydramine-acetaminophen (TYLENOL PM) 25-500 MG TABS tablet Take 2 tablets by mouth at bedtime as needed (sleep/headache).     ferric citrate (AURYXIA) 1 GM 210 MG(Fe) tablet Take 1 tablet (210 mg total) by mouth daily with supper. 270 tablet    fluticasone (FLONASE) 50 MCG/ACT nasal spray Place 1-2 sprays into both nostrils daily as needed for allergies or rhinitis.     gabapentin (NEURONTIN) 100 MG capsule Take 1 capsule (100 mg total) by mouth 2 (two) times daily. Take 1 PO at night for 5 days then go to twice daily 60 capsule 2   glucose blood (ACCU-CHEK GUIDE) test strip USE 1 STRIP TO CHECK FASTING BLOOD SUGAR AND 2 HOURS AFTER LARGEST MEAL 100 each 0   insulin glargine (LANTUS) 100 UNIT/ML injection Inject into the skin daily as needed (low blood glucose 180). Unknow dose to give if needed     lidocaine-prilocaine (EMLA) cream Apply topically.     loratadine (CLARITIN) 10 MG tablet Take 10 mg by mouth every evening.      midodrine (PROAMATINE) 10 MG tablet  Take 1 tablet (10 mg total) by mouth 3 (three) times daily as needed (for hypotension/dialysis). AS ordered. Take In the evening 10 mg on Mon. Wed and Friday Take 20 mg on Dialysis days Tues, Thurs and Saturday. 10 mg when he gets there and 10 mg half way through. 270 tablet 3   nitroGLYCERIN (NITROSTAT) 0.4 MG SL tablet Place 1 tablet (0.4 mg total) under the tongue every 5 (five) minutes as needed for chest pain. 14 tablet 12   omeprazole (PRILOSEC OTC) 20 MG tablet Take 40 mg by mouth daily  before breakfast.      pembrolizumab (KEYTRUDA) 100 MG/4ML SOLN Inject into the vein.     prochlorperazine (COMPAZINE) 10 MG tablet Take 1 tablet (10 mg total) by mouth every 6 (six) hours as needed for nausea or vomiting. 30 tablet 0   Sennosides (SENOKOT EXTRA STRENGTH) 17.2 MG TABS Take 34.4 mg by mouth at bedtime.     traMADol (ULTRAM) 50 MG tablet Take 1 tablet (50 mg total) by mouth every 6 (six) hours as needed (Pain). 30 tablet 1   warfarin (COUMADIN) 3 MG tablet TAKE 1 TO 2 TABLETS BY MOUTH DAILY AS DIRECTED BY  THE  COUMADIN  CLINIC 60 tablet 5   No current facility-administered medications for this visit.     Allergies:  Allergies  Allergen Reactions   Penicillins Rash and Other (See Comments)    Has patient had a PCN reaction causing immediate rash, facial/tongue/throat swelling, SOB or lightheadedness with hypotension: yes Has patient had a PCN reaction causing severe rash involving mucus membranes or skin necrosis: no Has patient had a PCN reaction that required hospitalization: no Has patient had a PCN reaction occurring within the last 10 years: no If all of the above answers are "NO", then may proceed with Cephalosporin use.       Physical Exam:      Blood pressure 99/67, pulse (!) 117, temperature 97.9 F (36.6 C), temperature source Temporal, resp. rate 17, weight 293 lb 14.4 oz (133.3 kg), SpO2 100 %.    ECOG: 1    General appearance: Alert, awake without any distress. Head: Atraumatic without abnormalities Oropharynx: Without any thrush or ulcers. Eyes: No scleral icterus. Lymph nodes: No lymphadenopathy noted in the cervical, supraclavicular, or axillary nodes Heart:regular rate and rhythm, without any murmurs or gallops.   Lung: Clear to auscultation without any rhonchi, wheezes or dullness to percussion. Abdomin: Soft, nontender without any shifting dullness or ascites. Musculoskeletal: No clubbing or cyanosis. Neurological: No motor or sensory  deficits. Skin: No rashes or lesions.          Lab Results: Lab Results  Component Value Date   WBC 10.2 12/15/2021   HGB 11.3 (L) 12/15/2021   HCT 33.7 (L) 12/15/2021   MCV 96.6 12/15/2021   PLT 285 12/15/2021     Chemistry      Component Value Date/Time   NA 138 12/15/2021 1134   NA 139 04/02/2021 0908   K 4.0 12/15/2021 1134   CL 98 12/15/2021 1134   CO2 23 12/15/2021 1134   BUN 31 (H) 12/15/2021 1134   BUN 29 (H) 04/02/2021 0908   CREATININE 7.45 (HH) 12/15/2021 1134   GLU 96 03/14/2019 0000      Component Value Date/Time   CALCIUM 9.1 12/15/2021 1134   ALKPHOS 42 12/15/2021 1134   AST 23 12/15/2021 1134   ALT 14 12/15/2021 1134   BILITOT 0.4 12/15/2021 1134  Impression and Plan:    1.  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   He continues to tolerate Pembrolizumab without any major complications at this time.  Risks and benefits of continuing this treatment were discussed.  Adding oral targeted therapy such as Inlyta or Lenvima will be considered if he has progression of disease.  Imaging studies obtained in April 2023 showed overall stable disease and these will be updated in August 2023.  He is agreeable to proceed.  2.  IV access: No issues reported with peripheral veins.   3.  Immune mediated complications: He has not experienced any issues at this time.  These include pneumonitis, colitis and thyroid disease.  We will continue to monitor and educate him about these issues.   4.  Antiemetics: Compazine is available to him without any nausea or vomiting.  5.  Chronic kidney disease: Continues to be dialysis dependent without any further decline.  6.  Tachycardia: No evidence of any cardiac arrhythmia.  This is chronic and unchanged.  7.  Dyspnea on exertion: Resolved with normalization of his breathing.  This is related to possible pulmonary infection versus fluid  overload.   8.  Flank pain: He is currently on tramadol with improvement in his pain.   9.  Follow-up: He will return in 6 weeks for follow-up.   30  minutes were dedicated to this visit.  The time was spent on reviewing laboratory data, disease status update and outlining future plan of care review.     Zola Button, MD 5/31/202312:21 PM

## 2022-02-01 ENCOUNTER — Other Ambulatory Visit: Payer: Self-pay | Admitting: Nurse Practitioner

## 2022-02-04 ENCOUNTER — Ambulatory Visit: Payer: Medicare Other | Admitting: Internal Medicine

## 2022-02-04 ENCOUNTER — Telehealth: Payer: Self-pay | Admitting: Oncology

## 2022-02-04 NOTE — Telephone Encounter (Signed)
Called patient regarding upcoming appointments, patient is notified. 

## 2022-02-08 ENCOUNTER — Ambulatory Visit (INDEPENDENT_AMBULATORY_CARE_PROVIDER_SITE_OTHER): Payer: Medicare Other

## 2022-02-08 DIAGNOSIS — I480 Paroxysmal atrial fibrillation: Secondary | ICD-10-CM | POA: Diagnosis not present

## 2022-02-08 DIAGNOSIS — Z7901 Long term (current) use of anticoagulants: Secondary | ICD-10-CM | POA: Diagnosis not present

## 2022-02-08 LAB — POCT INR: INR: 2.6 (ref 2.0–3.0)

## 2022-02-08 NOTE — Patient Instructions (Signed)
Description   Continue taking 1 tablet daily except 0.5 tablet on Mondays and Fridays.  Repeat INR in 5 weeks.  Coumadin Clinic [phone]

## 2022-02-10 ENCOUNTER — Other Ambulatory Visit: Payer: Self-pay | Admitting: Oncology

## 2022-03-09 ENCOUNTER — Inpatient Hospital Stay (HOSPITAL_BASED_OUTPATIENT_CLINIC_OR_DEPARTMENT_OTHER): Payer: Medicare Other | Admitting: Oncology

## 2022-03-09 ENCOUNTER — Inpatient Hospital Stay: Payer: Medicare Other

## 2022-03-09 ENCOUNTER — Inpatient Hospital Stay: Payer: Medicare Other | Attending: Oncology

## 2022-03-09 ENCOUNTER — Other Ambulatory Visit: Payer: Self-pay

## 2022-03-09 VITALS — HR 95

## 2022-03-09 VITALS — BP 120/71 | HR 101 | Temp 97.7°F | Resp 19 | Ht 73.0 in | Wt 295.6 lb

## 2022-03-09 DIAGNOSIS — Z5112 Encounter for antineoplastic immunotherapy: Secondary | ICD-10-CM | POA: Insufficient documentation

## 2022-03-09 DIAGNOSIS — C7801 Secondary malignant neoplasm of right lung: Secondary | ICD-10-CM | POA: Insufficient documentation

## 2022-03-09 DIAGNOSIS — Z79899 Other long term (current) drug therapy: Secondary | ICD-10-CM | POA: Insufficient documentation

## 2022-03-09 DIAGNOSIS — C641 Malignant neoplasm of right kidney, except renal pelvis: Secondary | ICD-10-CM | POA: Diagnosis not present

## 2022-03-09 DIAGNOSIS — R000 Tachycardia, unspecified: Secondary | ICD-10-CM | POA: Diagnosis not present

## 2022-03-09 DIAGNOSIS — C7902 Secondary malignant neoplasm of left kidney and renal pelvis: Secondary | ICD-10-CM | POA: Diagnosis not present

## 2022-03-09 DIAGNOSIS — Z905 Acquired absence of kidney: Secondary | ICD-10-CM | POA: Insufficient documentation

## 2022-03-09 DIAGNOSIS — Z923 Personal history of irradiation: Secondary | ICD-10-CM | POA: Diagnosis not present

## 2022-03-09 DIAGNOSIS — C649 Malignant neoplasm of unspecified kidney, except renal pelvis: Secondary | ICD-10-CM | POA: Diagnosis present

## 2022-03-09 DIAGNOSIS — R0609 Other forms of dyspnea: Secondary | ICD-10-CM | POA: Insufficient documentation

## 2022-03-09 DIAGNOSIS — E032 Hypothyroidism due to medicaments and other exogenous substances: Secondary | ICD-10-CM

## 2022-03-09 LAB — CBC WITH DIFFERENTIAL (CANCER CENTER ONLY)
Abs Immature Granulocytes: 0.21 10*3/uL — ABNORMAL HIGH (ref 0.00–0.07)
Basophils Absolute: 0.1 10*3/uL (ref 0.0–0.1)
Basophils Relative: 1 %
Eosinophils Absolute: 0.4 10*3/uL (ref 0.0–0.5)
Eosinophils Relative: 4 %
HCT: 35.2 % — ABNORMAL LOW (ref 39.0–52.0)
Hemoglobin: 12 g/dL — ABNORMAL LOW (ref 13.0–17.0)
Immature Granulocytes: 2 %
Lymphocytes Relative: 14 %
Lymphs Abs: 1.4 10*3/uL (ref 0.7–4.0)
MCH: 32.2 pg (ref 26.0–34.0)
MCHC: 34.1 g/dL (ref 30.0–36.0)
MCV: 94.4 fL (ref 80.0–100.0)
Monocytes Absolute: 0.8 10*3/uL (ref 0.1–1.0)
Monocytes Relative: 8 %
Neutro Abs: 7.5 10*3/uL (ref 1.7–7.7)
Neutrophils Relative %: 71 %
Platelet Count: 244 10*3/uL (ref 150–400)
RBC: 3.73 MIL/uL — ABNORMAL LOW (ref 4.22–5.81)
RDW: 13.8 % (ref 11.5–15.5)
WBC Count: 10.4 10*3/uL (ref 4.0–10.5)
nRBC: 0 % (ref 0.0–0.2)

## 2022-03-09 LAB — CMP (CANCER CENTER ONLY)
ALT: 14 U/L (ref 0–44)
AST: 16 U/L (ref 15–41)
Albumin: 4.2 g/dL (ref 3.5–5.0)
Alkaline Phosphatase: 45 U/L (ref 38–126)
Anion gap: 13 (ref 5–15)
BUN: 34 mg/dL — ABNORMAL HIGH (ref 8–23)
CO2: 27 mmol/L (ref 22–32)
Calcium: 10.1 mg/dL (ref 8.9–10.3)
Chloride: 98 mmol/L (ref 98–111)
Creatinine: 7.82 mg/dL (ref 0.61–1.24)
GFR, Estimated: 7 mL/min — ABNORMAL LOW (ref >60)
Glucose, Bld: 143 mg/dL — ABNORMAL HIGH (ref 70–99)
Potassium: 4.2 mmol/L (ref 3.5–5.1)
Sodium: 138 mmol/L (ref 135–145)
Total Bilirubin: 0.4 mg/dL (ref 0.3–1.2)
Total Protein: 7.8 g/dL (ref 6.5–8.1)

## 2022-03-09 LAB — TSH: TSH: 2.381 u[IU]/mL (ref 0.350–4.500)

## 2022-03-09 MED ORDER — SODIUM CHLORIDE 0.9 % IV SOLN
400.0000 mg | Freq: Once | INTRAVENOUS | Status: AC
  Administered 2022-03-09: 400 mg via INTRAVENOUS
  Filled 2022-03-09: qty 16

## 2022-03-09 MED ORDER — SODIUM CHLORIDE 0.9 % IV SOLN: Freq: Once | INTRAVENOUS | Status: AC

## 2022-03-09 MED ORDER — PROCHLORPERAZINE MALEATE 10 MG PO TABS: 10.0000 mg | ORAL_TABLET | Freq: Four times a day (QID) | ORAL | 1 refills | Status: AC | PRN

## 2022-03-09 NOTE — Progress Notes (Signed)
CRITICAL VALUE STICKER  CRITICAL VALUE: Creatnine 7.82  RECEIVER (on-site recipient of call): Velna Ochs RN   DATE & TIME NOTIFIED: 03/09/22 @ [phone]  MESSENGER (representative from lab): Verdis Frederickson  MD NOTIFIED: Dr Alen Blew  TIME OF NOTIFICATION: [id number]  RESPONSE:  MD aware

## 2022-03-09 NOTE — Patient Instructions (Signed)
[facility]  Discharge Instructions: ?Thank you for choosing [facility] to provide your oncology and hematology care.  ? ?If you have a lab appointment with the Cancer Center, please go directly to the Cancer Center and check in at the registration area. ?  ?Wear comfortable clothing and clothing appropriate for easy access to any Portacath or PICC line.  ? ?We strive to give you quality time with your provider. You may need to reschedule your appointment if you arrive late (15 or more minutes).  Arriving late affects you and other patients whose appointments are after yours.  Also, if you miss three or more appointments without notifying the office, you may be dismissed from the clinic at the provider?s discretion.    ?  ?For prescription refill requests, have your pharmacy contact our office and allow 72 hours for refills to be completed.   ? ?Today you received the following chemotherapy and/or immunotherapy agents: Keytruda ?  ?To help prevent nausea and vomiting after your treatment, we encourage you to take your nausea medication as directed. ? ?BELOW ARE SYMPTOMS THAT SHOULD BE REPORTED IMMEDIATELY: ?*FEVER GREATER THAN 100.4 F (38 ?C) OR HIGHER ?*CHILLS OR SWEATING ?*NAUSEA AND VOMITING THAT IS NOT CONTROLLED WITH YOUR NAUSEA MEDICATION ?*UNUSUAL SHORTNESS OF BREATH ?*UNUSUAL BRUISING OR BLEEDING ?*URINARY PROBLEMS (pain or burning when urinating, or frequent urination) ?*BOWEL PROBLEMS (unusual diarrhea, constipation, pain near the anus) ?TENDERNESS IN MOUTH AND THROAT WITH OR WITHOUT PRESENCE OF ULCERS (sore throat, sores in mouth, or a toothache) ?UNUSUAL RASH, SWELLING OR PAIN  ?UNUSUAL VAGINAL DISCHARGE OR ITCHING  ? ?Items with * indicate a potential emergency and should be followed up as soon as possible or go to the Emergency Department if any problems should occur. ? ?Please show the CHEMOTHERAPY ALERT CARD or IMMUNOTHERAPY ALERT CARD at check-in to the  Emergency Department and triage nurse. ? ?Should you have questions after your visit or need to cancel or reschedule your appointment, please contact [facility]  Dept: [phone]  and follow the prompts.  Office hours are 8:00 a.m. to 4:30 p.m. Monday - Friday. Please note that voicemails left after 4:00 p.m. may not be returned until the following business day.  We are closed weekends and major holidays. You have access to a nurse at all times for urgent questions. Please call the main number to the clinic Dept: [phone] and follow the prompts. ? ? ?For any non-urgent questions, you may also contact your provider using MyChart. We now offer e-Visits for anyone 18 and older to request care online for non-urgent symptoms. For details visit [email or web address]. ?  ?Also download the MyChart app! Go to the app store, search "MyChart", open the app, select [facility], and log in with your MyChart username and password. ? ?Due to Covid, a mask is required upon entering the hospital/clinic. If you do not have a mask, one will be given to you upon arrival. For doctor visits, patients may have 1 support person aged 18 or older with them. For treatment visits, patients cannot have anyone with them due to current Covid guidelines and our immunocompromised population.  ? ?

## 2022-03-09 NOTE — Progress Notes (Signed)
Hematology and Oncology Follow Up Visit  J[place]ell Town [id number] [birth year] 66 y.o. 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  He presented with localized disease in 2019.  Prior Therapy:  He underwent laparoscopic radical nephrectomy completed on May 16, 2018 under the care of Dr. Gloriann Loan.  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  Histological grade 4.   He underwent robotic assisted left upper lobe wedge resection completed by Dr. Roxan Hockey on October 16, 2020.  The final pathology showed clear-cell renal cell carcinoma with negative margins.    He is status post radiation therapy to the left upper lobe and the right lower lobe pulmonary nodules.  October 2022.   Current therapy: Pembrolizumab 400 mg every 6 weeks started in Jan 13, 2021.  He is here for cycle 10 of therapy.  Interim History: Mr. Huseby is here for a follow-up visit.  Since last visit, he reports no major changes in his health.  He denies any shortness of breath or difficulty breathing.  He denies any cough, wheezing or hemoptysis.  He denies any hospitalizations or illnesses.  He denies any skin rashes or lesions.  He denies any specific complications related to Pembrolizumab.    Medications: Reviewed without changes. Current Outpatient Medications  Medication Sig Dispense Refill   Accu-Chek FastClix Lancets MISC Use to check fasting blood sugar and 2 hrs after largest meal. 100 each 0   acetaminophen (TYLENOL) 500 MG tablet Take 1,000 mg by mouth every 8 (eight) hours as needed for mild pain or headache.     allopurinol (ZYLOPRIM) 100 MG tablet Take 1 tablet (100 mg total) by mouth every evening. 30 tablet 0   amiodarone  (PACERONE) 200 MG tablet Take 1 tablet (200 mg total) by mouth every evening. 90 tablet 1   atorvastatin (LIPITOR) 40 MG tablet Take 1 tablet (40 mg total) by mouth every evening.     blood glucose meter kit and supplies Dispense based on patient and insurance preference. Use up to four times daily as directed. (FOR ICD-10 E10.9, E11.9). 1 each 0   diphenhydramine-acetaminophen (TYLENOL PM) 25-500 MG TABS tablet Take 2 tablets by mouth at bedtime as needed (sleep/headache).     ferric citrate (AURYXIA) 1 GM 210 MG(Fe) tablet Take 1 tablet (210 mg total) by mouth daily with supper. 270 tablet    fluticasone (FLONASE) 50 MCG/ACT nasal spray Place 1-2 sprays into both nostrils daily as needed for allergies or rhinitis.     gabapentin (NEURONTIN) 100 MG capsule Take 1 capsule (100 mg total) by mouth 2 (two) times daily. Take 1 PO at night for 5 days then go to twice daily 60 capsule 2   glucose blood (ACCU-CHEK GUIDE) test strip USE 1 STRIP TO CHECK FASTING BLOOD SUGAR AND 2 HOURS AFTER LARGEST MEAL 100 each 0   insulin glargine (LANTUS) 100 UNIT/ML injection Inject into the skin daily as needed (low blood glucose 180). Unknow dose to give if needed     lidocaine-prilocaine (EMLA) cream Apply topically.     loratadine (CLARITIN) 10 MG tablet Take 10 mg by mouth every evening.      midodrine (PROAMATINE) 10 MG tablet Take 1 tablet (10 mg total) by mouth 3 (three) times daily as needed (for hypotension/dialysis). AS ordered.  Take In the evening 10 mg on Mon. Wed and Friday Take 20 mg on Dialysis days Tues, Thurs and Saturday. 10 mg when he gets there and 10 mg half way through. 270 tablet 3   nitroGLYCERIN (NITROSTAT) 0.4 MG SL tablet Place 1 tablet (0.4 mg total) under the tongue every 5 (five) minutes as needed for chest pain. 14 tablet 12   omeprazole (PRILOSEC OTC) 20 MG tablet Take 40 mg by mouth daily before breakfast.      pembrolizumab (KEYTRUDA) 100 MG/4ML SOLN Inject into the vein.      prochlorperazine (COMPAZINE) 10 MG tablet Take 1 tablet (10 mg total) by mouth every 6 (six) hours as needed for nausea or vomiting. 30 tablet 0   Sennosides (SENOKOT EXTRA STRENGTH) 17.2 MG TABS Take 34.4 mg by mouth at bedtime.     traMADol (ULTRAM) 50 MG tablet TAKE 1 TABLET BY MOUTH EVERY 6 HOURS AS NEEDED FOR PAIN 30 tablet 0   warfarin (COUMADIN) 3 MG tablet TAKE 1 TO 2 TABLETS BY MOUTH DAILY AS DIRECTED BY  THE  COUMADIN  CLINIC 60 tablet 5   No current facility-administered medications for this visit.     Allergies:  Allergies  Allergen Reactions   Penicillins Rash and Other (See Comments)    Has patient had a PCN reaction causing immediate rash, facial/tongue/throat swelling, SOB or lightheadedness with hypotension: yes Has patient had a PCN reaction causing severe rash involving mucus membranes or skin necrosis: no Has patient had a PCN reaction that required hospitalization: no Has patient had a PCN reaction occurring within the last 10 years: no If all of the above answers are "NO", then may proceed with Cephalosporin use.       Physical Exam:      Blood pressure 120/71, pulse (!) 101, temperature 97.7 F (36.5 C), temperature source Temporal, resp. rate 19, height '6\' 1"'  (1.854 m), weight 295 lb 9.6 oz (134.1 kg), SpO2 100 %.     ECOG: 1   General appearance: Comfortable appearing without any discomfort Head: Normocephalic without any trauma Oropharynx: Mucous membranes are moist and pink without any thrush or ulcers. Eyes: Pupils are equal and round reactive to light. Lymph nodes: No cervical, supraclavicular, inguinal or axillary lymphadenopathy.   Heart:regular rate and rhythm.  S1 and S2 without leg edema. Lung: Clear without any rhonchi or wheezes.  No dullness to percussion. Abdomin: Soft, nontender, nondistended with good bowel sounds.  No hepatosplenomegaly. Musculoskeletal: No joint deformity or effusion.  Full range of motion noted. Neurological:  No deficits noted on motor, sensory and deep tendon reflex exam. Skin: No petechial rash or dryness.  Appeared moist.           Lab Results: Lab Results  Component Value Date   WBC 10.0 01/26/2022   HGB 12.5 (L) 01/26/2022   HCT 36.3 (L) 01/26/2022   MCV 94.8 01/26/2022   PLT 298 01/26/2022     Chemistry      Component Value Date/Time   NA 136 01/26/2022 1230   NA 139 04/02/2021 0908   K 4.0 01/26/2022 1230   CL 95 (L) 01/26/2022 1230   CO2 27 01/26/2022 1230   BUN 38 (H) 01/26/2022 1230   BUN 29 (H) 04/02/2021 0908   CREATININE 7.85 (HH) 01/26/2022 1230   GLU 96 03/14/2019 0000      Component Value Date/Time   CALCIUM 9.6 01/26/2022 1230   ALKPHOS 51 01/26/2022 1230   AST 21 01/26/2022 1230  ALT 18 01/26/2022 1230   BILITOT 0.4 01/26/2022 1230            Impression and Plan:  66 year old with  1.  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   Risks and benefits of continuing single agent Pembrolizumab were discussed today.  The role for adding oral targeted therapy in the form of axitinib or Lenvima were reviewed.  The plan is to update his staging scan and consider adding oral targeted therapy pending his imaging.  He is agreeable to proceed at this time.  2.  IV access: Peripheral veins are currently in use without any issues.   3.  Immune mediated complications: I continue to educate him about pneumonitis, colitis and thyroid disease among others.   4.  Antiemetics: No nausea or vomiting reported at this time.  Compazine is available to him and will be refilled today.  5.  Chronic kidney disease: He is on dialysis without any complications.  6.  Tachycardia: Improved at this time and close to normal range.  He is asymptomatic.  7.  Dyspnea on exertion: Related to fluid overload and improved with increase ultrafiltration.   8.  Flank pain: He  remains on tramadol with reasonable control of his pain.   9.  Follow-up: In 6 weeks for repeat follow-up and imaging studies.   30  minutes were spent on this encounter.  The time was dedicated to reviewing laboratory data, disease status update and outlining future plan of care discussion.     Zola Button, MD 7/12/20238:35 AM

## 2022-03-11 ENCOUNTER — Telehealth: Payer: Self-pay | Admitting: Oncology

## 2022-03-11 NOTE — Telephone Encounter (Signed)
Scheduled per 07/12 los, patient has been called and notified of upcoming appointments.

## 2022-03-15 ENCOUNTER — Ambulatory Visit (INDEPENDENT_AMBULATORY_CARE_PROVIDER_SITE_OTHER): Payer: Medicare Other

## 2022-03-15 DIAGNOSIS — I480 Paroxysmal atrial fibrillation: Secondary | ICD-10-CM | POA: Diagnosis not present

## 2022-03-15 DIAGNOSIS — Z7901 Long term (current) use of anticoagulants: Secondary | ICD-10-CM | POA: Diagnosis not present

## 2022-03-15 LAB — POCT INR: INR: 2.5 (ref 2.0–3.0)

## 2022-03-15 NOTE — Patient Instructions (Signed)
Description   Continue taking 1 tablet daily except 0.5 tablet on Mondays and Fridays.  Repeat INR in 6 weeks.  Coumadin Clinic [phone]

## 2022-03-21 ENCOUNTER — Other Ambulatory Visit: Payer: Self-pay

## 2022-04-08 ENCOUNTER — Other Ambulatory Visit: Payer: Self-pay | Admitting: Oncology

## 2022-04-08 DIAGNOSIS — C641 Malignant neoplasm of right kidney, except renal pelvis: Secondary | ICD-10-CM

## 2022-04-13 ENCOUNTER — Other Ambulatory Visit: Payer: Self-pay

## 2022-04-20 ENCOUNTER — Encounter (HOSPITAL_COMMUNITY): Payer: Self-pay

## 2022-04-20 ENCOUNTER — Ambulatory Visit (HOSPITAL_COMMUNITY)
Admission: RE | Admit: 2022-04-20 | Discharge: 2022-04-20 | Disposition: A | Discharge status: Home / Self Care | Payer: Medicare Other | Source: Ambulatory Visit | Attending: Oncology | Admitting: Oncology

## 2022-04-20 DIAGNOSIS — C641 Malignant neoplasm of right kidney, except renal pelvis: Secondary | ICD-10-CM | POA: Diagnosis present

## 2022-04-22 ENCOUNTER — Inpatient Hospital Stay: Payer: Medicare Other | Attending: Oncology

## 2022-04-22 ENCOUNTER — Other Ambulatory Visit (HOSPITAL_COMMUNITY): Payer: Self-pay

## 2022-04-22 ENCOUNTER — Telehealth: Payer: Self-pay

## 2022-04-22 ENCOUNTER — Telehealth: Payer: Self-pay | Admitting: Pharmacy Technician

## 2022-04-22 ENCOUNTER — Telehealth: Payer: Self-pay | Admitting: *Deleted

## 2022-04-22 ENCOUNTER — Inpatient Hospital Stay (HOSPITAL_BASED_OUTPATIENT_CLINIC_OR_DEPARTMENT_OTHER): Payer: Medicare Other | Admitting: Oncology

## 2022-04-22 ENCOUNTER — Other Ambulatory Visit: Payer: Self-pay

## 2022-04-22 ENCOUNTER — Inpatient Hospital Stay: Payer: Medicare Other

## 2022-04-22 ENCOUNTER — Encounter: Payer: Self-pay | Admitting: Oncology

## 2022-04-22 VITALS — BP 108/60 | HR 90 | Resp 18

## 2022-04-22 VITALS — BP 107/62 | HR 108 | Temp 97.9°F | Resp 18 | Ht 73.0 in | Wt 295.2 lb

## 2022-04-22 DIAGNOSIS — R918 Other nonspecific abnormal finding of lung field: Secondary | ICD-10-CM | POA: Insufficient documentation

## 2022-04-22 DIAGNOSIS — N186 End stage renal disease: Secondary | ICD-10-CM | POA: Diagnosis not present

## 2022-04-22 DIAGNOSIS — K760 Fatty (change of) liver, not elsewhere classified: Secondary | ICD-10-CM | POA: Insufficient documentation

## 2022-04-22 DIAGNOSIS — Z992 Dependence on renal dialysis: Secondary | ICD-10-CM | POA: Insufficient documentation

## 2022-04-22 DIAGNOSIS — C641 Malignant neoplasm of right kidney, except renal pelvis: Secondary | ICD-10-CM | POA: Diagnosis not present

## 2022-04-22 DIAGNOSIS — C649 Malignant neoplasm of unspecified kidney, except renal pelvis: Secondary | ICD-10-CM | POA: Insufficient documentation

## 2022-04-22 DIAGNOSIS — Z5112 Encounter for antineoplastic immunotherapy: Secondary | ICD-10-CM | POA: Diagnosis present

## 2022-04-22 DIAGNOSIS — R162 Hepatomegaly with splenomegaly, not elsewhere classified: Secondary | ICD-10-CM | POA: Diagnosis not present

## 2022-04-22 DIAGNOSIS — Z79899 Other long term (current) drug therapy: Secondary | ICD-10-CM | POA: Diagnosis not present

## 2022-04-22 DIAGNOSIS — Z923 Personal history of irradiation: Secondary | ICD-10-CM | POA: Insufficient documentation

## 2022-04-22 LAB — CBC WITH DIFFERENTIAL (CANCER CENTER ONLY)
Abs Immature Granulocytes: 0.22 10*3/uL — ABNORMAL HIGH (ref 0.00–0.07)
Basophils Absolute: 0.1 10*3/uL (ref 0.0–0.1)
Basophils Relative: 1 %
Eosinophils Absolute: 0.4 10*3/uL (ref 0.0–0.5)
Eosinophils Relative: 4 %
HCT: 34.6 % — ABNORMAL LOW (ref 39.0–52.0)
Hemoglobin: 12.1 g/dL — ABNORMAL LOW (ref 13.0–17.0)
Immature Granulocytes: 2 %
Lymphocytes Relative: 16 %
Lymphs Abs: 1.6 10*3/uL (ref 0.7–4.0)
MCH: 32.3 pg (ref 26.0–34.0)
MCHC: 35 g/dL (ref 30.0–36.0)
MCV: 92.3 fL (ref 80.0–100.0)
Monocytes Absolute: 1 10*3/uL (ref 0.1–1.0)
Monocytes Relative: 10 %
Neutro Abs: 6.9 10*3/uL (ref 1.7–7.7)
Neutrophils Relative %: 67 %
Platelet Count: 273 10*3/uL (ref 150–400)
RBC: 3.75 MIL/uL — ABNORMAL LOW (ref 4.22–5.81)
RDW: 13.8 % (ref 11.5–15.5)
WBC Count: 10.2 10*3/uL (ref 4.0–10.5)
nRBC: 0 % (ref 0.0–0.2)

## 2022-04-22 LAB — CMP (CANCER CENTER ONLY)
ALT: 11 U/L (ref 0–44)
AST: 16 U/L (ref 15–41)
Albumin: 4.3 g/dL (ref 3.5–5.0)
Alkaline Phosphatase: 49 U/L (ref 38–126)
Anion gap: 15 (ref 5–15)
BUN: 35 mg/dL — ABNORMAL HIGH (ref 8–23)
CO2: 27 mmol/L (ref 22–32)
Calcium: 9.7 mg/dL (ref 8.9–10.3)
Chloride: 96 mmol/L — ABNORMAL LOW (ref 98–111)
Creatinine: 8.63 mg/dL (ref 0.61–1.24)
GFR, Estimated: 6 mL/min — ABNORMAL LOW (ref >60)
Glucose, Bld: 110 mg/dL — ABNORMAL HIGH (ref 70–99)
Potassium: 4 mmol/L (ref 3.5–5.1)
Sodium: 138 mmol/L (ref 135–145)
Total Bilirubin: 0.4 mg/dL (ref 0.3–1.2)
Total Protein: 7.8 g/dL (ref 6.5–8.1)

## 2022-04-22 LAB — TSH: TSH: 1.989 u[IU]/mL (ref 0.350–4.500)

## 2022-04-22 MED ORDER — SODIUM CHLORIDE 0.9 % IV SOLN
400.0000 mg | Freq: Once | INTRAVENOUS | Status: AC
  Administered 2022-04-22: 400 mg via INTRAVENOUS
  Filled 2022-04-22: qty 16

## 2022-04-22 MED ORDER — SODIUM CHLORIDE 0.9 % IV SOLN: Freq: Once | INTRAVENOUS | Status: AC

## 2022-04-22 MED ORDER — AXITINIB 5 MG PO TABS
5.0000 mg | ORAL_TABLET | Freq: Two times a day (BID) | ORAL | 1 refills | Status: DC
  Filled 2022-04-22: qty 60, 30d supply, fill #0

## 2022-04-22 NOTE — Telephone Encounter (Signed)
CRITICAL VALUE STICKER  CRITICAL VALUE: Creatinine = 8.63  RECEIVER (on-site recipient of call): Yetta Glassman, CMA  DATE & TIME NOTIFIED: 04/22/22 at 11:55am  MESSENGER (representative from lab): Lauren  MD NOTIFIED: Alen Blew  TIME OF NOTIFICATION: 04/22/22 at 11:58am  RESPONSE: Notification provided to Dr. Alen Blew and Porsche, LPN, for follow-up with pt.

## 2022-04-22 NOTE — Telephone Encounter (Signed)
Oral Oncology Patient Advocate Encounter   Received notification that prior authorization for Inlyta is required.   PA submitted on 04/22/2022 Key [id number] Status is pending     [name], CPhT-Adv Pharmacy Patient Advocate Specialist [place] Patient Advocate Team Direct Number: [phone]  Fax: [phone]

## 2022-04-22 NOTE — Telephone Encounter (Signed)
Oral Oncology Patient Advocate Encounter  Prior Authorization for Bartholomew Boards has been approved.    PA# [id number] Effective dates: 04/22/2022 through 08/28/2022  Patients co-pay is $2,903.79.    [name], CPhT-Adv Pharmacy Patient Advocate Specialist [place] Patient Advocate Team Direct Number: [phone]  Fax: [phone]

## 2022-04-22 NOTE — Telephone Encounter (Signed)
Oral Oncology Patient Advocate Encounter  Was successful in securing patient a $10,000 grant from [employer] to provide copayment coverage for inlyta.  This will keep the out of pocket expense at $0.     [employer] ID: [id number]  Final approval is pending Medicare verification. I will continue to monitor for final determination.   The billing information is as follows and has been shared with [facility].    RxBin: [id number] PCN: [id number] Member ID: [id number] Group ID: [id number] Dates of Eligibility: 03/22/2022 through 03/23/2023  Fund:  Renal Cell  [name], CPhT-Adv Pharmacy Patient Advocate Specialist [place] Patient Advocate Team Direct Number: [phone]  Fax: [phone]

## 2022-04-22 NOTE — Patient Instructions (Signed)
[facility]   Discharge Instructions: Thank you for choosing [facility] to provide your oncology and hematology care.   If you have a lab appointment with the [facility], please go directly to the [facility] and check in at the registration area.   Wear comfortable clothing and clothing appropriate for easy access to any Portacath or PICC line.   We strive to give you quality time with your provider. You may need to reschedule your appointment if you arrive late (15 or more minutes).  Arriving late affects you and other patients whose appointments are after yours.  Also, if you miss three or more appointments without notifying the office, you may be dismissed from the clinic at the provider's discretion.      For prescription refill requests, have your pharmacy contact our office and allow 72 hours for refills to be completed.    Today you received the following chemotherapy and/or immunotherapy agents: Pembrolizumab Beryle Flock)      To help prevent nausea and vomiting after your treatment, we encourage you to take your nausea medication as directed.  BELOW ARE SYMPTOMS THAT SHOULD BE REPORTED IMMEDIATELY: *FEVER GREATER THAN 100.4 F (38 C) OR HIGHER *CHILLS OR SWEATING *NAUSEA AND VOMITING THAT IS NOT CONTROLLED WITH YOUR NAUSEA MEDICATION *UNUSUAL SHORTNESS OF BREATH *UNUSUAL BRUISING OR BLEEDING *URINARY PROBLEMS (pain or burning when urinating, or frequent urination) *BOWEL PROBLEMS (unusual diarrhea, constipation, pain near the anus) TENDERNESS IN MOUTH AND THROAT WITH OR WITHOUT PRESENCE OF ULCERS (sore throat, sores in mouth, or a toothache) UNUSUAL RASH, SWELLING OR PAIN  UNUSUAL VAGINAL DISCHARGE OR ITCHING   Items with * indicate a potential emergency and should be followed up as soon as possible or go to the Emergency Department if any problems should occur.  Please show the CHEMOTHERAPY ALERT CARD or IMMUNOTHERAPY ALERT  CARD at check-in to the Emergency Department and triage nurse.  Should you have questions after your visit or need to cancel or reschedule your appointment, please contact [facility]  Dept: [phone]  and follow the prompts.  Office hours are 8:00 a.m. to 4:30 p.m. Monday - Friday. Please note that voicemails left after 4:00 p.m. may not be returned until the following business day.  We are closed weekends and major holidays. You have access to a nurse at all times for urgent questions. Please call the main number to the clinic Dept: [phone] and follow the prompts.   For any non-urgent questions, you may also contact your provider using MyChart. We now offer e-Visits for anyone 91 and older to request care online for non-urgent symptoms. For details visit [email or web address].   Also download the MyChart app! Go to the app store, search "MyChart", open the app, select [facility], and log in with your MyChart username and password.  Masks are optional in the cancer centers. If you would like for your care team to wear a mask while they are taking care of you, please let them know. You may have one support person who is at least 64 years old accompany you for your appointments.

## 2022-04-22 NOTE — Progress Notes (Signed)
Per Dr. Alen Blew - okay to treat with elevated serum creatinine of 8.63 and elevated HR of 108.

## 2022-04-22 NOTE — Telephone Encounter (Signed)
Per Dr.Shadad, OK to treat with creatinine 8.63 and elevated HR

## 2022-04-22 NOTE — Telephone Encounter (Addendum)
Oral Oncology Pharmacist Encounter  Received new prescription for axitinib (Inlyta) for the treatment of stage IV clear-cell renal cell carcinoma in conjunction with pembrolizumab, planned duration until disease progression or unacceptable toxicity.  Labs from 04/22/22 (CBC, CMP, and TSH in process) assessed, no interventions needed. Prescription dose and frequency assessed. Patient is on dialysis- no dose decreases needed for renal function; patient will be monitored closely.  Current medication list in Epic reviewed, no DDIs with Inlyta identified.  Evaluated chart and no patient barriers to medication adherence noted.   Patient agreement for treatment documented in MD note on 04/22/22.  Prescription has been e-scribed to the [facility] for benefits analysis and approval.  Oral Oncology Clinic will continue to follow for insurance authorization, copayment issues, initial counseling and start date.  Drema Halon, PharmD Hematology/Oncology Clinical Pharmacist [facility] [phone] 04/22/2022 11:51 AM

## 2022-04-22 NOTE — Progress Notes (Signed)
Hematology and Oncology Follow Up Visit  George Lewis [id number] 03-25-1956 66 y.o. 04/22/2022 11:29 AM George Oka, PA-C   Principle Diagnosis: 67 year old man with kidney cancer diagnosed in 2019.  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  Prior Therapy:  He underwent laparoscopic radical nephrectomy completed on May 16, 2018 under the care of Dr. Gloriann Loan.  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  Histological grade 4.   He underwent robotic assisted left upper lobe wedge resection completed by Dr. Roxan Hockey on October 16, 2020.  The final pathology showed clear-cell renal cell carcinoma with negative margins.    He is status post radiation therapy to the left upper lobe and the right lower lobe pulmonary nodules.  October 2022.   Current therapy: Pembrolizumab 400 mg every 6 weeks started in Jan 13, 2021.  He returns for the next cycle of therapy.    Interim History: George Lewis returns today for a follow-up evaluation.  Since the last visit, he reports no major changes in his health.  He continues to feel well without any recent hospitalizations or illnesses.  He denies any shortness of breath or difficulty breathing.  He denies any cough or wheezing.  He denies any skin rashes or changes in his bowels.  He does have mild constipation.    Medications: Updated on review. Current Outpatient Medications  Medication Sig Dispense Refill   Accu-Chek FastClix Lancets MISC Use to check fasting blood sugar and 2 hrs after largest meal. 100 each 0   acetaminophen (TYLENOL) 500 MG tablet Take 1,000 mg by mouth every 8 (eight) hours as needed for mild pain or headache.     allopurinol (ZYLOPRIM) 100 MG tablet Take 1 tablet (100 mg total) by mouth every evening. 30 tablet 0   amiodarone (PACERONE) 200 MG  tablet Take 1 tablet (200 mg total) by mouth every evening. 90 tablet 1   atorvastatin (LIPITOR) 40 MG tablet Take 1 tablet (40 mg total) by mouth every evening.     blood glucose meter kit and supplies Dispense based on patient and insurance preference. Use up to four times daily as directed. (FOR ICD-10 E10.9, E11.9). 1 each 0   diphenhydramine-acetaminophen (TYLENOL PM) 25-500 MG TABS tablet Take 2 tablets by mouth at bedtime as needed (sleep/headache).     ferric citrate (AURYXIA) 1 GM 210 MG(Fe) tablet Take 1 tablet (210 mg total) by mouth daily with supper. 270 tablet    fluticasone (FLONASE) 50 MCG/ACT nasal spray Place 1-2 sprays into both nostrils daily as needed for allergies or rhinitis.     gabapentin (NEURONTIN) 100 MG capsule Take 1 capsule (100 mg total) by mouth 2 (two) times daily. Take 1 PO at night for 5 days then go to twice daily 60 capsule 2   glucose blood (ACCU-CHEK GUIDE) test strip USE 1 STRIP TO CHECK FASTING BLOOD SUGAR AND 2 HOURS AFTER LARGEST MEAL 100 each 0   insulin glargine (LANTUS) 100 UNIT/ML injection Inject into the skin daily as needed (low blood glucose 180). Unknow dose to give if needed     lidocaine-prilocaine (EMLA) cream Apply topically.     loratadine (CLARITIN) 10 MG tablet Take 10 mg by mouth every evening.      midodrine (PROAMATINE) 10 MG tablet Take 1 tablet (10 mg total) by mouth 3 (three) times daily as needed (for hypotension/dialysis).  AS ordered. Take In the evening 10 mg on Mon. Wed and Friday Take 20 mg on Dialysis days Tues, Thurs and Saturday. 10 mg when he gets there and 10 mg half way through. 270 tablet 3   nitroGLYCERIN (NITROSTAT) 0.4 MG SL tablet Place 1 tablet (0.4 mg total) under the tongue every 5 (five) minutes as needed for chest pain. 14 tablet 12   omeprazole (PRILOSEC OTC) 20 MG tablet Take 40 mg by mouth daily before breakfast.      pembrolizumab (KEYTRUDA) 100 MG/4ML SOLN Inject into the vein.     prochlorperazine (COMPAZINE)  10 MG tablet Take 1 tablet (10 mg total) by mouth every 6 (six) hours as needed for nausea or vomiting. 60 tablet 1   Sennosides (SENOKOT EXTRA STRENGTH) 17.2 MG TABS Take 34.4 mg by mouth at bedtime.     traMADol (ULTRAM) 50 MG tablet TAKE 1 TABLET BY MOUTH EVERY 6 HOURS AS NEEDED FOR PAIN 30 tablet 0   warfarin (COUMADIN) 3 MG tablet TAKE 1 TO 2 TABLETS BY MOUTH DAILY AS DIRECTED BY  THE  COUMADIN  CLINIC 60 tablet 5   No current facility-administered medications for this visit.     Allergies:  Allergies  Allergen Reactions   Penicillins Rash and Other (See Comments)    Has patient had a PCN reaction causing immediate rash, facial/tongue/throat swelling, SOB or lightheadedness with hypotension: yes Has patient had a PCN reaction causing severe rash involving mucus membranes or skin necrosis: no Has patient had a PCN reaction that required hospitalization: no Has patient had a PCN reaction occurring within the last 10 years: no If all of the above answers are "NO", then may proceed with Cephalosporin use.       Physical Exam:       Blood pressure 107/62, pulse (!) 108, temperature 97.9 F (36.6 C), temperature source Temporal, resp. rate 18, height $RemoveBe'6\' 1"'dmYkyVwIn$  (1.854 m), weight 295 lb 3.2 oz (133.9 kg), SpO2 100 %.     ECOG: 1   General appearance: Alert, awake without any distress. Head: Atraumatic without abnormalities Oropharynx: Without any thrush or ulcers. Eyes: No scleral icterus. Lymph nodes: No lymphadenopathy noted in the cervical, supraclavicular, or axillary nodes Heart:regular rate and rhythm, without any murmurs or gallops.   Lung: Clear to auscultation without any rhonchi, wheezes or dullness to percussion. Abdomin: Soft, nontender without any shifting dullness or ascites. Musculoskeletal: No clubbing or cyanosis. Neurological: No motor or sensory deficits. Skin: No rashes or lesions.           Lab Results: Lab Results  Component Value Date    WBC 10.2 04/22/2022   HGB 12.1 (L) 04/22/2022   HCT 34.6 (L) 04/22/2022   MCV 92.3 04/22/2022   PLT 273 04/22/2022     Chemistry      Component Value Date/Time   NA 138 03/09/2022 0829   NA 139 04/02/2021 0908   K 4.2 03/09/2022 0829   CL 98 03/09/2022 0829   CO2 27 03/09/2022 0829   BUN 34 (H) 03/09/2022 0829   BUN 29 (H) 04/02/2021 0908   CREATININE 7.82 (HH) 03/09/2022 0829   GLU 96 03/14/2019 0000      Component Value Date/Time   CALCIUM 10.1 03/09/2022 0829   ALKPHOS 45 03/09/2022 0829   AST 16 03/09/2022 0829   ALT 14 03/09/2022 0829   BILITOT 0.4 03/09/2022 0829       IMPRESSION: 1. Multiple bilateral pulmonary nodules with slight enlargement of  RIGHT lower lobe nodules and with potential new pulmonary nodule of 4 mm in the medial RIGHT lung base. 2. Enlarging posterior mediastinal adenopathy. 3. Stable volume loss and chronic effusion in the LEFT chest. 4. No signs of local recurrence in the abdomen or disease in the abdomen at this time. 5. Moderate hepatomegaly and hepatic steatosis with fissural widening of hepatic fissures. Correlate with any clinical or laboratory evidence of liver disease, findings associated with mild splenomegaly. 6. Aortic atherosclerosis and calcified coronary artery disease.   Aortic Atherosclerosis (ICD10-I70.0).     Impression and Plan:  66 year old with  1.  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   His disease status was updated at this time and treatment choices were reviewed.  CT scan obtained on April 20, 2022 was personally reviewed and showed slight progression of his pulmonary nodules.  His overall disease burden remains low.  Risks and benefits of adding oral targeted therapy in addition to Pembrolizumab were reviewed.  Complication associated with Inlyta were discussed.  These include hypertension, diarrhea, hand-foot syndrome among others.  After  discussion he is agreeable to proceed and will start at 5 mg twice a day and adjust the dose if needed due to.  2.  IV access: No issues with peripheral veins at this time.   3.  Immune mediated complications: He has not experienced any complication including pneumonitis, colitis and thyroid disease.   4.  Antiemetics: Compazine is available to him without any nausea or vomiting.  5.  Chronic kidney disease: Continues to be dialysis dependent without any issues or complications.  6.  Tachycardia: Remains mild and asymptomatic.  Unchanged from previous examination.  7.  Follow-up: In 6 weeks for follow-up visit.   30  minutes were spent on this encounter.  The time was dedicated to reviewing imaging studies, treatment choices and addressing complication related to cancer and cancer therapy.     Zola Button, MD 8/25/202311:29 AM

## 2022-04-23 LAB — T4: T4, Total: 6.8 ug/dL (ref 4.5–12.0)

## 2022-04-26 ENCOUNTER — Other Ambulatory Visit (HOSPITAL_COMMUNITY): Payer: Self-pay

## 2022-04-26 ENCOUNTER — Ambulatory Visit: Payer: Medicare Other | Attending: Cardiology

## 2022-04-26 DIAGNOSIS — I480 Paroxysmal atrial fibrillation: Secondary | ICD-10-CM | POA: Diagnosis not present

## 2022-04-26 DIAGNOSIS — Z7901 Long term (current) use of anticoagulants: Secondary | ICD-10-CM | POA: Insufficient documentation

## 2022-04-26 LAB — POCT INR: INR: 2.3 (ref 2.0–3.0)

## 2022-04-26 NOTE — Patient Instructions (Signed)
Description   Continue taking 1 tablet daily except 0.5 tablet on Mondays and Fridays.  Repeat INR in 6 weeks.  Coumadin Clinic [phone]

## 2022-04-27 ENCOUNTER — Other Ambulatory Visit (HOSPITAL_COMMUNITY): Payer: Self-pay

## 2022-04-27 ENCOUNTER — Other Ambulatory Visit: Payer: Self-pay

## 2022-04-28 ENCOUNTER — Other Ambulatory Visit (HOSPITAL_COMMUNITY): Payer: Self-pay

## 2022-04-29 ENCOUNTER — Telehealth: Payer: Self-pay | Admitting: Pharmacy Technician

## 2022-04-29 ENCOUNTER — Other Ambulatory Visit (HOSPITAL_COMMUNITY): Payer: Self-pay

## 2022-04-29 NOTE — Telephone Encounter (Signed)
Oral Oncology Patient Advocate Encounter   Began application for assistance for Inlyta through [employer].  Reached out and spoke with patient regarding PAP paperwork, explained that I would send it to their preferred email via DocuSign.   Confirmed email address:  [email or web address]  Patient expressed understanding and consent.  Will follow up once paperwork has been signed and returned.  [name], CPhT-Adv Oncology Pharmacy Patient [place] Direct Number: [phone]  Fax: [phone]

## 2022-05-03 ENCOUNTER — Other Ambulatory Visit (HOSPITAL_COMMUNITY): Payer: Self-pay

## 2022-05-03 NOTE — Telephone Encounter (Signed)
Oral Oncology Patient Advocate Encounter  [facility] could not verify patient's Medicare coverage to their satisfaction. Fatima Sanger will be considered inactive.  I have begun the process of patient assistance with the patient, will continue to pursue that route for treatment affordability.   [employer] ID: [id number]  [name], CPhT-Adv Oncology Pharmacy Patient [place] Direct Number: [phone]  Fax: [phone]

## 2022-05-04 ENCOUNTER — Other Ambulatory Visit (HOSPITAL_COMMUNITY): Payer: Self-pay

## 2022-05-04 NOTE — Telephone Encounter (Signed)
Oral Oncology Patient Advocate Encounter   Submitted application for assistance for Inlyta to [employer].   Application submitted via e-fax to [place] phone number [phone].   I will continue to check the status until final determination.   [name], CPhT-Adv Oncology Pharmacy Patient [place] Direct Number: [phone]  Fax: [phone]

## 2022-05-09 ENCOUNTER — Telehealth: Payer: Self-pay | Admitting: Internal Medicine

## 2022-05-09 MED ORDER — AMIODARONE HCL 200 MG PO TABS: 200.0000 mg | ORAL_TABLET | Freq: Every evening | ORAL | 3 refills | Status: DC

## 2022-05-09 NOTE — Telephone Encounter (Signed)
*  STAT* If patient is at the pharmacy, call can be transferred to refill team.   1. Which medications need to be refilled? (please list name of each medication and dose if known) amiodarone (PACERONE) 200 MG tablet  2. Which pharmacy/location (including street and city if local pharmacy) is medication to be sent to? [facility], [place]  3. Do they need a 30 day or 90 day supply? 90 day   Patient is out of medication

## 2022-05-10 NOTE — Telephone Encounter (Signed)
Oral Oncology Patient Advocate Encounter   Received notification that the application for assistance for Inlyta through [place] has been approved.   [employer] [facility] phone number [phone].   Effective dates: 05/09/2022 through 08/28/2022  [name], [place] Patient [place] Direct Number: [phone]  Fax: [phone]

## 2022-05-12 NOTE — Telephone Encounter (Addendum)
Oral Oncology Pharmacist Encounter  Spoke to patients wife who has not received a call from [employer] yet. I called [employer] and conferenced in patients wife to set up shipment.   Shipment is set up and will be delivered on Saturday, 9/16.  Drema Halon, PharmD Hematology/Oncology Clinical Pharmacist [facility] [phone]

## 2022-05-13 ENCOUNTER — Encounter: Payer: Self-pay | Admitting: Internal Medicine

## 2022-05-13 ENCOUNTER — Ambulatory Visit: Payer: Medicare Other | Attending: Internal Medicine | Admitting: Internal Medicine

## 2022-05-13 VITALS — BP 110/58 | HR 89 | Ht 73.0 in | Wt 291.2 lb

## 2022-05-13 DIAGNOSIS — Z992 Dependence on renal dialysis: Secondary | ICD-10-CM

## 2022-05-13 DIAGNOSIS — C641 Malignant neoplasm of right kidney, except renal pelvis: Secondary | ICD-10-CM

## 2022-05-13 DIAGNOSIS — E7800 Pure hypercholesterolemia, unspecified: Secondary | ICD-10-CM

## 2022-05-13 DIAGNOSIS — E1122 Type 2 diabetes mellitus with diabetic chronic kidney disease: Secondary | ICD-10-CM | POA: Diagnosis present

## 2022-05-13 DIAGNOSIS — Z7901 Long term (current) use of anticoagulants: Secondary | ICD-10-CM | POA: Diagnosis not present

## 2022-05-13 DIAGNOSIS — Z794 Long term (current) use of insulin: Secondary | ICD-10-CM | POA: Diagnosis present

## 2022-05-13 DIAGNOSIS — N186 End stage renal disease: Secondary | ICD-10-CM

## 2022-05-13 DIAGNOSIS — I480 Paroxysmal atrial fibrillation: Secondary | ICD-10-CM

## 2022-05-13 DIAGNOSIS — D6869 Other thrombophilia: Secondary | ICD-10-CM

## 2022-05-13 NOTE — Progress Notes (Signed)
Cardiology Office Note:    Date:  05/13/2022   ID:  George Lewis., DOB 12-26-1955, MRN [id number]  PCP:  Lorrene Reid, PA-C  Cardiologist:  Elouise Munroe, MD  Electrophysiologist:  None   Referring MD: Lorrene Reid, PA-C   Chief Complaint/Reason for Referral: Afib, hypotension  History of Present Illness:    George Lewis. is a 66 y.o. male with a history of right renal cell carcinoma s/p nephrectomy in 9/19, ESRD on HD (TTS), history of pulmonary embolism, HTN, HLD, SVT, DM type II, OSA on CPAP, morbid obesity, and paroxysmal atrial fibrillation who presents for follow up.  He is on Coumadin for CHA2DS2-VASc score of 3. 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   He is feeling well today, presents today with his wife.  His daughter is getting married next Saturday and he intends to walk her down the aisle.  He has been trying to stay active mowing his lawn, weed eating, and working in his shop which she has been able to do.  When he climbs the 14 steps to his upper level to take a shower he does have to stop at the top for shortness of breath, but this recovers relatively quickly.  He attributes his chronic shortness of breath to wedge resection for lung cancer, and it has been relatively stable.  The previously noted chest discomfort at his last visit in March is no longer present.  He has been treated for pneumonia since our last visit.  His blood pressure log has improved significantly and he continues on intermittent hemodialysis.  He has not required midodrine in at least 2 months.  He is anticipating starting a new chemotherapeutic agent Inlyta (axitinib).  I have reviewed his oncologist notes.  This new therapeutic option may have a risk for hypertension, and I have asked him to continue taking his blood pressures daily.  More  recently he has not required truncated dialysis sessions for hypotension, blood pressure has been quite acceptable.  Last visit 02/12/21: He is not feeling well. He is accompanied by his wife, who also provides some history and presents a bp log. On non-dialysis days his blood pressure is typically low because he is not currently taking midodrine on these days. Dialysis has not needed to be discontinued due to his blood pressure, and blood pressure on dialysis days has improved since he is taking 3 tablets of 10 mg of midodrine serially throughout his dialysis session.    He feels poorly today and he had been lying down on the exam table, and after sitting up he became lightheaded. Occasionally after a large yawn, he has a strange sensation for a few seconds with arm shaking and feeling his vision altered.  He has been severely fatigued since his pulmonary surgery 09/2020. He is scheduled to return 6/29 for his next infusion of Keytruda.  He has done well with Keytruda and does not have signs or symptoms of heart failure.  We discussed rare instances of pericarditis and myocarditis with immunologic therapy, he has had no symptoms to suggest this.  His fatigue is significantly preventing him from being active, and this frustrates him.  He still enjoys building bird houses but would like to be more active.  At this time he is wearing compression socks and he believes they are helping.  He denies any shortness of breath,  palpitations.No headaches, or syncope to report. Also has mild lower extremity edema, no orthopnea or PND.  Past Medical History:  Diagnosis Date   Anemia    Arthritis    knees   Atrial fibrillation ([place])    Bilateral renal cysts 03/23/2018   Noted MRI ABD   Cancer ([place])    right kidney cancer   Cancer ([place])    pancreatic   Cholelithiasis 03/19/2018   noted on CT AB/Pelvis   CKD (chronic kidney disease), stage IV [facility])    nephrologist-- dr Hollie Salk [name]);   05-01-2018 has not started dialysis, scheduled for AV fistula creation 05-07-2018   Diabetes mellitus without complication ([place])    Diverticulosis of colon 04/19/2018   Noted on CT abd/pelvis   Dysrhythmia    afib   GERD (gastroesophageal reflux disease)    Hepatic steatosis 03/19/2018   Mild diffuse, noted on CT AB/Pelvis   History of kidney stones    History of pulmonary embolus (PE) 03/2008   treated with coumadin for 6 months   History of sepsis 04/20/2018   per discharge note , probable UTI   Hypertension    IBS (irritable bowel syndrome)    Nocturia    OSA on CPAP    uses CPAP nightly   Pancreatic lesion    0.4cm cystic per CT 07/ 2019   Peripheral vascular disease (HCC)    Pneumonia    x 1   Pulmonary nodule    Solid 84m Right Lower Lobe   Right renal mass 04/10/2018   new dx--  scheduled for nephrectomy 05-16-2018   S/P ureteral stent placement: 04/16/2018 04/19/2018    Past Surgical History:  Procedure Laterality Date   AV FISTULA PLACEMENT Left 05/07/2018   Procedure: Creation of Left arm Radiocephalic Fistula;  Surgeon: CMarty Heck MD;  [facility]  Service: Vascular;  Laterality: Left;   AV FISTULA PLACEMENT Left 05/15/2019   Procedure: Creation of Brachiocephalic fistula left arm;  Surgeon: CWaynetta Sandy MD;  [facility]  Service: Vascular;  Laterality: Left;   [place] 07/[place]   Procedure: BASILIC VEIN TRANSPOSITION SECOND STAGE;  Surgeon: CWaynetta Sandy MD;  [facility]  Service: Vascular;  Laterality: Left;   BRONCHIAL BRUSHINGS  08/25/2020   Procedure: BRONCHIAL BRUSHINGS;  Surgeon: IGarner Nash DO;  [facility];  Service: Pulmonary;;   BRONCHIAL NEEDLE ASPIRATION BIOPSY  08/25/2020   Procedure: BRONCHIAL NEEDLE ASPIRATION BIOPSIES;  Surgeon: IGarner Nash DO;  [facility];  Service: Pulmonary;;   BRONCHIAL WASHINGS  08/25/2020   Procedure: BRONCHIAL  WASHINGS;  Surgeon: IGarner Nash DO;  [facility]  Service: Pulmonary;;   COLONOSCOPY     CYSTOSCOPY/RETROGRADE/URETEROSCOPY/STONE EXTRACTION WITH BASKET  x2 last one 1990s approx.   CYSTOSCOPY/URETEROSCOPY/HOLMIUM LASER/STENT PLACEMENT Left 04/16/2018   Procedure: CYSTOSCOPY/LEFT URETEROSCOPY/LEFT RETROGRADE/STENT PLACEMENT;  Surgeon: BLucas Mallow MD;  [facility];  Service: Urology;  Laterality: Left;   EUS N/A 05/03/2018   Procedure: UPPER ENDOSCOPIC ULTRASOUND (EUS) RADIAL;  Surgeon: JMilus Banister MD;  [facility];  Service: Gastroenterology;  Laterality: N/A;   EUS N/A 05/03/2018   Procedure: UPPER ENDOSCOPIC ULTRASOUND (EUS) LINEAR;  Surgeon: JMilus Banister MD;  [facility];  Service: Gastroenterology;  Laterality: N/A;   EUS  10/05/2020   upper GI   EXTRACORPOREAL SHOCK WAVE LITHOTRIPSY  x3  last one 2004 approx.   FINE NEEDLE ASPIRATION N/A 05/03/2018  Procedure: FINE NEEDLE ASPIRATION (FNA) LINEAR;  Surgeon: Milus Banister, MD;  [facility];  Service: Gastroenterology;  Laterality: N/A;   INTERCOSTAL NERVE BLOCK Left 10/16/2020   Procedure: INTERCOSTAL NERVE BLOCK;  Surgeon: Melrose Nakayama, MD;  Location: [place];  Service: Thoracic;  Laterality: Left;   LAPAROSCOPIC NEPHRECTOMY, HAND ASSISTED Right 05/16/2018   Procedure: HAND ASSISTED LAPAROSCOPIC RIGHT NEPHRECTOMY;  Surgeon: Lucas Mallow, MD;  [facility];  Service: Urology;  Laterality: Right;   left index finger attachment  1980s   3-4 surgeries   NODE DISSECTION Left 10/16/2020   Procedure: NODE DISSECTION;  Surgeon: Melrose Nakayama, MD;  Location: [place];  Service: Thoracic;  Laterality: Left;   TOE SURGERY  1980s   in beween 2nd and 3rd toes cyst removed   UPPER GI ENDOSCOPY     VIDEO BRONCHOSCOPY WITH ENDOBRONCHIAL NAVIGATION Bilateral 08/25/2020   Procedure: VIDEO BRONCHOSCOPY WITH ENDOBRONCHIAL NAVIGATION;  Surgeon: Garner Nash, DO;   Location: [place];  Service: Pulmonary;  Laterality: Bilateral;    Current Medications: Current Meds  Medication Sig   Accu-Chek FastClix Lancets MISC Use to check fasting blood sugar and 2 hrs after largest meal.   acetaminophen (TYLENOL) 500 MG tablet Take 1,000 mg by mouth every 8 (eight) hours as needed for mild pain or headache.   allopurinol (ZYLOPRIM) 100 MG tablet Take 1 tablet (100 mg total) by mouth every evening.   amiodarone (PACERONE) 200 MG tablet Take 1 tablet (200 mg total) by mouth every evening.   atorvastatin (LIPITOR) 40 MG tablet Take 1 tablet (40 mg total) by mouth every evening.   blood glucose meter kit and supplies Dispense based on patient and insurance preference. Use up to four times daily as directed. (FOR ICD-10 E10.9, E11.9).   diphenhydramine-acetaminophen (TYLENOL PM) 25-500 MG TABS tablet Take 2 tablets by mouth at bedtime as needed (sleep/headache).   ferric citrate (AURYXIA) 1 GM 210 MG(Fe) tablet Take 1 tablet (210 mg total) by mouth daily with supper.   fluticasone (FLONASE) 50 MCG/ACT nasal spray Place 1-2 sprays into both nostrils daily as needed for allergies or rhinitis.   gabapentin (NEURONTIN) 100 MG capsule Take 1 capsule (100 mg total) by mouth 2 (two) times daily. Take 1 PO at night for 5 days then go to twice daily   glucose blood (ACCU-CHEK GUIDE) test strip USE 1 STRIP TO CHECK FASTING BLOOD SUGAR AND 2 HOURS AFTER LARGEST MEAL   lidocaine-prilocaine (EMLA) cream Apply topically.   loratadine (CLARITIN) 10 MG tablet Take 10 mg by mouth every evening.    midodrine (PROAMATINE) 10 MG tablet Take 1 tablet (10 mg total) by mouth 3 (three) times daily as needed (for hypotension/dialysis). AS ordered. Take In the evening 10 mg on Mon. Wed and Friday Take 20 mg on Dialysis days Tues, Thurs and Saturday. 10 mg when he gets there and 10 mg half way through.   nitroGLYCERIN (NITROSTAT) 0.4 MG SL tablet Place 1 tablet (0.4 mg total) under the tongue  every 5 (five) minutes as needed for chest pain.   omeprazole (PRILOSEC OTC) 20 MG tablet Take 40 mg by mouth daily before breakfast.    pembrolizumab (KEYTRUDA) 100 MG/4ML SOLN Inject into the vein.   prochlorperazine (COMPAZINE) 10 MG tablet Take 1 tablet (10 mg total) by mouth every 6 (six) hours as needed for nausea or vomiting.   Sennosides (SENOKOT EXTRA STRENGTH) 17.2 MG TABS Take 34.4 mg by mouth at bedtime.  traMADol (ULTRAM) 50 MG tablet TAKE 1 TABLET BY MOUTH EVERY 6 HOURS AS NEEDED FOR PAIN   warfarin (COUMADIN) 3 MG tablet TAKE 1 TO 2 TABLETS BY MOUTH DAILY AS DIRECTED BY  THE  COUMADIN  CLINIC     Allergies:   Penicillins   Social History   Tobacco Use   Smoking status: Never   Smokeless tobacco: Never  Vaping Use   Vaping Use: Never used  Substance Use Topics   Alcohol use: Never   Drug use: Never     Family History: The patient's family history includes Alzheimer's disease in his father and mother; Cancer - Other in his sister; Heart attack in his father; Heart disease in his father.  ROS:   Please see the history of present illness.    (+) Fatigue (+) Tremors, right UE (+) Lightheadedness All other systems reviewed and are negative.  EKGs/Labs/Other Studies Reviewed:    The following studies were reviewed today:  US Duplex Dialysis Access 06/28/2019: Summary:  Patent arteriovenous fistula.   Lexiscan Myoview 05/10/2019: The left ventricular ejection fraction is mildly decreased (45-54%). Nuclear stress EF: 52%. There was no ST segment deviation noted during stress. Defect 1: There is a small defect of severe severity present in the apex location. This is a low risk study.   Abnormal, low risk stress nuclear study with significant apical thinning vs infarct; cannot exclude very mild superimposed apical lateral ischemia; EF low normal (52) with normal wall motion.  Echo 02/26/2019: 1. The left ventricle has normal systolic function with an ejection   fraction of 60-65%. The cavity size was normal. Left ventricular diastolic  Doppler parameters are consistent with impaired relaxation.   2. The right ventricle has normal systolic function. The cavity was  normal. There is no increase in right ventricular wall thickness.   3. Left atrial size was mildly dilated.   4. The aortic valve is tricuspid. Mild thickening of the aortic valve.  Mild calcification of the aortic valve.  EKG:  05/13/22: NSR rate 89 02/12/2021: Sinus tachycardia, PVC's, rate 112  11/04/2020: Sinus tachycardia, rate 104 08/31/2020: Sinus tachycardia, rate 114  Recent Labs: 11/20/2021: B Natriuretic Peptide 72.7 04/22/2022: ALT 11; BUN 35; Creatinine 8.63; Hemoglobin 12.1; Platelet Count 273; Potassium 4.0; Sodium 138; TSH 1.989  Recent Lipid Panel    Component Value Date/Time   CHOL 229 (H) 04/02/2021 0908   TRIG 347 (H) 04/02/2021 0908   HDL 31 (L) 04/02/2021 0908   CHOLHDL 7.4 (H) 04/02/2021 0908   LDLCALC 135 (H) 04/02/2021 0908    Physical Exam:    VS:  BP (!) 110/58 (BP Location: Right Arm, Patient Position: Sitting, Cuff Size: Large)   Pulse 89   Ht _0  (1.854 m)   Wt 291 lb 3.2 oz (132.1 kg)   SpO2 100%   BMI 38.42 kg/m     Wt Readings from Last 5 Encounters:  05/13/22 291 lb 3.2 oz (132.1 kg)  04/22/22 295 lb 3.2 oz (133.9 kg)  03/09/22 295 lb 9.6 oz (134.1 kg)  01/26/22 293 lb 14.4 oz (133.3 kg)  12/15/21 294 lb 6 oz (133.5 kg)    Constitutional: No acute distress Eyes: sclera non-icteric, normal conjunctiva and lids ENMT: normal dentition, moist mucous membranes Cardiovascular: regular rhythm, tachycardic rate, no murmurs. S1 and S2 normal. No jugular venous distention.  Left upper arm AV fistula appears normal. Respiratory: Mildly decreased breath sounds on left, clear on right. GI : normal bowel sounds, soft  and nontender. No distention.   MSK: extremities warm, well perfused. No edema.  NEURO: grossly nonfocal exam, moves all  extremities. PSYCH: alert and oriented x 3, normal mood and affect.   ASSESSMENT:    1. Paroxysmal atrial fibrillation (HCC)   2. Secondary hypercoagulable state ([place])   3. Long term (current) use of anticoagulants   4. ESRD (end stage renal disease) on dialysis ([place])   5. Renal cell carcinoma, right ([place])   6. Hypercholesterolemia   7. Malignant neoplasm of right kidney, except renal pelvis ([place])   8. Morbid obesity ([place])   9. Type 2 diabetes mellitus with chronic kidney disease on chronic dialysis, with long-term current use of insulin (HCC)      PLAN:    Paroxysmal atrial fibrillation ([place]) - Plan: EKG 12-Lead Secondary hypercoagulable state ([place]) Long term (current) use of anticoagulants -Remains in sinus rhythm. - amiodarone 200 mg daily, recently refilled. - continues on warfarin per patient preference for stroke risk reduction.  INRs have been therapeutic recently.   Orthostatic hypotension/Presyncope -  -This has not been an issue recently and he has not required midodrine in 2 or more months.  Will observe blood pressure as he initiates new chemotherapy agent which may have side effect of hypertension.  He is feeling well and overall blood pressure has been stable.  HLD - continue atorvastatin 40 mg daily.   Total time of encounter: 30 minutes total time of encounter, including 20 minutes spent in face-to-face patient care on the date of this encounter. This time includes coordination of care and counseling regarding above mentioned problem list. Remainder of non-face-to-face time involved reviewing chart documents/testing relevant to the patient encounter and documentation in the medical record. I have independently reviewed documentation from referring provider.   Cherlynn Kaiser, MD, [facility]    Medication Adjustments/Labs and Tests Ordered: Current medicines are reviewed at length with the patient today.  Concerns regarding medicines are  outlined above.   No orders of the defined types were placed in this encounter.    No orders of the defined types were placed in this encounter.    Patient Instructions  Medication Instructions:  No Changes In Medications at this time.  *If you need a refill on your cardiac medications before your next appointment, please call your pharmacy*  Follow-Up: At [facility], you and your health needs are our priority.  As part of our continuing mission to provide you with exceptional heart care, we have created designated Provider Care Teams.  These Care Teams include your primary Cardiologist (physician) and Advanced Practice Providers (APPs -  Physician Assistants and Nurse Practitioners) who all work together to provide you with the care you need, when you need it.  Your next appointment:   6 month(s)  The format for your next appointment:   In Person  Provider:   Elouise Munroe, MD

## 2022-05-13 NOTE — Patient Instructions (Signed)
Medication Instructions:  No Changes In Medications at this time.  *If you need a refill on your cardiac medications before your next appointment, please call your pharmacy*  Follow-Up: At [facility], you and your health needs are our priority.  As part of our continuing mission to provide you with exceptional heart care, we have created designated Provider Care Teams.  These Care Teams include your primary Cardiologist (physician) and Advanced Practice Providers (APPs -  Physician Assistants and Nurse Practitioners) who all work together to provide you with the care you need, when you need it.  Your next appointment:   6 month(s)  The format for your next appointment:   In Person  Provider:   Gayatri A Acharya, MD          

## 2022-05-13 NOTE — Telephone Encounter (Signed)
Oral Chemotherapy Pharmacist Encounter  I spoke with patient for overview of: Inlyta (axitinib) for the treatment of previously treated, advanced or metastatic renal cell carcinoma, planned duration until disease progression or unacceptable toxicity.   Counseled patient on administration, dosing, side effects, monitoring, drug-food interactions, safe handling, storage, and disposal.  Patient will take Inlyta 5mg  tablets, 1 tablet by mouth twice daily, without regard to food, with a glass of water.  Patient instructed to avoid grapefruit and grapefruit juice while on therapy with Inlyta.  Inlyta start date: 05/22/2022  Adverse effects include but are not limited to: hypertension, hand-foot syndrome, nausea, vomiting, diarrhea, fatigue, dysphonia (hoarseness), and abnormal laboratory values.   Patient will obtain anti diarrheal and alert the office of 4 or more loose stools above baseline.  Inlyta will be held at least 24 hours prior to surgery and resumed at discretion of treating physician based on wound healing  Reviewed with patient importance of keeping a medication schedule and plan for any missed doses. No barriers to medication adherence identified.  Medication reconciliation performed and medication/allergy list updated.  Insurance authorization for [employer] has been obtained. Test claim at the pharmacy revealed copayment $*** for 1st fill of ***. This will ship from the [facility] outpatient pharmacy on *** to deliver to patient's home on ***.  Patient informed the pharmacy will reach out 5-7 days prior to needing next fill of Inlyta to coordinate continued medication acquisition to prevent break in therapy.  All questions answered.  *** voiced understanding and appreciation.   Medication education handout placed in mail for patient. Patient knows to call the office with questions or concerns. Oral Chemotherapy Clinic phone number provided to patient.   *** 05/13/2022   12:05  PM Oral Oncology Clinic [phone]

## 2022-05-14 ENCOUNTER — Other Ambulatory Visit: Payer: Self-pay

## 2022-05-19 NOTE — Addendum Note (Signed)
Addended by: Rexanne Mano B on: 05/19/2022 10:15 AM   Modules accepted: Orders

## 2022-05-29 DIAGNOSIS — I350 Nonrheumatic aortic (valve) stenosis: Secondary | ICD-10-CM

## 2022-05-29 HISTORY — DX: Nonrheumatic aortic (valve) stenosis: I35.0

## 2022-05-31 ENCOUNTER — Inpatient Hospital Stay (HOSPITAL_COMMUNITY)
Admission: EM | Admit: 2022-05-31 | Discharge: 2022-06-06 | DRG: 321 | Disposition: A | Discharge status: Home / Self Care | Payer: Medicare Other | Attending: Internal Medicine | Admitting: Internal Medicine

## 2022-05-31 ENCOUNTER — Encounter (HOSPITAL_COMMUNITY): Payer: Self-pay

## 2022-05-31 ENCOUNTER — Other Ambulatory Visit: Payer: Self-pay

## 2022-05-31 ENCOUNTER — Emergency Department (HOSPITAL_COMMUNITY): Payer: Medicare Other

## 2022-05-31 DIAGNOSIS — Z905 Acquired absence of kidney: Secondary | ICD-10-CM

## 2022-05-31 DIAGNOSIS — Z992 Dependence on renal dialysis: Secondary | ICD-10-CM

## 2022-05-31 DIAGNOSIS — C7801 Secondary malignant neoplasm of right lung: Secondary | ICD-10-CM | POA: Diagnosis present

## 2022-05-31 DIAGNOSIS — E785 Hyperlipidemia, unspecified: Secondary | ICD-10-CM | POA: Diagnosis present

## 2022-05-31 DIAGNOSIS — I200 Unstable angina: Secondary | ICD-10-CM | POA: Diagnosis not present

## 2022-05-31 DIAGNOSIS — J9811 Atelectasis: Secondary | ICD-10-CM | POA: Diagnosis present

## 2022-05-31 DIAGNOSIS — E1122 Type 2 diabetes mellitus with diabetic chronic kidney disease: Secondary | ICD-10-CM | POA: Diagnosis present

## 2022-05-31 DIAGNOSIS — I480 Paroxysmal atrial fibrillation: Secondary | ICD-10-CM | POA: Diagnosis present

## 2022-05-31 DIAGNOSIS — I132 Hypertensive heart and chronic kidney disease with heart failure and with stage 5 chronic kidney disease, or end stage renal disease: Secondary | ICD-10-CM | POA: Diagnosis present

## 2022-05-31 DIAGNOSIS — R0600 Dyspnea, unspecified: Principal | ICD-10-CM

## 2022-05-31 DIAGNOSIS — Z86711 Personal history of pulmonary embolism: Secondary | ICD-10-CM | POA: Diagnosis not present

## 2022-05-31 DIAGNOSIS — I10 Essential (primary) hypertension: Secondary | ICD-10-CM | POA: Diagnosis not present

## 2022-05-31 DIAGNOSIS — J189 Pneumonia, unspecified organism: Secondary | ICD-10-CM

## 2022-05-31 DIAGNOSIS — R072 Precordial pain: Secondary | ICD-10-CM

## 2022-05-31 DIAGNOSIS — J90 Pleural effusion, not elsewhere classified: Secondary | ICD-10-CM

## 2022-05-31 DIAGNOSIS — I350 Nonrheumatic aortic (valve) stenosis: Secondary | ICD-10-CM | POA: Diagnosis present

## 2022-05-31 DIAGNOSIS — I5033 Acute on chronic diastolic (congestive) heart failure: Secondary | ICD-10-CM | POA: Diagnosis present

## 2022-05-31 DIAGNOSIS — E7800 Pure hypercholesterolemia, unspecified: Secondary | ICD-10-CM | POA: Diagnosis not present

## 2022-05-31 DIAGNOSIS — R079 Chest pain, unspecified: Secondary | ICD-10-CM | POA: Diagnosis not present

## 2022-05-31 DIAGNOSIS — G4733 Obstructive sleep apnea (adult) (pediatric): Secondary | ICD-10-CM | POA: Diagnosis present

## 2022-05-31 DIAGNOSIS — Z85528 Personal history of other malignant neoplasm of kidney: Secondary | ICD-10-CM

## 2022-05-31 DIAGNOSIS — N186 End stage renal disease: Secondary | ICD-10-CM | POA: Diagnosis present

## 2022-05-31 DIAGNOSIS — Z1152 Encounter for screening for COVID-19: Secondary | ICD-10-CM

## 2022-05-31 DIAGNOSIS — Z794 Long term (current) use of insulin: Secondary | ICD-10-CM

## 2022-05-31 DIAGNOSIS — Z8507 Personal history of malignant neoplasm of pancreas: Secondary | ICD-10-CM

## 2022-05-31 DIAGNOSIS — I2510 Atherosclerotic heart disease of native coronary artery without angina pectoris: Secondary | ICD-10-CM | POA: Diagnosis not present

## 2022-05-31 DIAGNOSIS — C641 Malignant neoplasm of right kidney, except renal pelvis: Secondary | ICD-10-CM | POA: Diagnosis not present

## 2022-05-31 DIAGNOSIS — Z6836 Body mass index (BMI) 36.0-36.9, adult: Secondary | ICD-10-CM

## 2022-05-31 DIAGNOSIS — E119 Type 2 diabetes mellitus without complications: Secondary | ICD-10-CM

## 2022-05-31 DIAGNOSIS — R7982 Elevated C-reactive protein (CRP): Secondary | ICD-10-CM | POA: Diagnosis present

## 2022-05-31 DIAGNOSIS — D631 Anemia in chronic kidney disease: Secondary | ICD-10-CM | POA: Diagnosis present

## 2022-05-31 DIAGNOSIS — I5031 Acute diastolic (congestive) heart failure: Secondary | ICD-10-CM | POA: Diagnosis not present

## 2022-05-31 DIAGNOSIS — Z880 Allergy status to penicillin: Secondary | ICD-10-CM | POA: Diagnosis not present

## 2022-05-31 DIAGNOSIS — K760 Fatty (change of) liver, not elsewhere classified: Secondary | ICD-10-CM | POA: Diagnosis present

## 2022-05-31 DIAGNOSIS — R0602 Shortness of breath: Secondary | ICD-10-CM

## 2022-05-31 DIAGNOSIS — Z955 Presence of coronary angioplasty implant and graft: Secondary | ICD-10-CM

## 2022-05-31 DIAGNOSIS — E1151 Type 2 diabetes mellitus with diabetic peripheral angiopathy without gangrene: Secondary | ICD-10-CM | POA: Diagnosis present

## 2022-05-31 DIAGNOSIS — J9601 Acute respiratory failure with hypoxia: Secondary | ICD-10-CM

## 2022-05-31 DIAGNOSIS — K219 Gastro-esophageal reflux disease without esophagitis: Secondary | ICD-10-CM | POA: Diagnosis present

## 2022-05-31 DIAGNOSIS — D638 Anemia in other chronic diseases classified elsewhere: Secondary | ICD-10-CM

## 2022-05-31 DIAGNOSIS — Z79899 Other long term (current) drug therapy: Secondary | ICD-10-CM

## 2022-05-31 DIAGNOSIS — R008 Other abnormalities of heart beat: Secondary | ICD-10-CM | POA: Diagnosis not present

## 2022-05-31 DIAGNOSIS — R0609 Other forms of dyspnea: Secondary | ICD-10-CM | POA: Diagnosis not present

## 2022-05-31 DIAGNOSIS — M898X9 Other specified disorders of bone, unspecified site: Secondary | ICD-10-CM | POA: Diagnosis present

## 2022-05-31 DIAGNOSIS — Z7901 Long term (current) use of anticoagulants: Secondary | ICD-10-CM

## 2022-05-31 DIAGNOSIS — Z8249 Family history of ischemic heart disease and other diseases of the circulatory system: Secondary | ICD-10-CM

## 2022-05-31 DIAGNOSIS — Q2112 Patent foramen ovale: Secondary | ICD-10-CM | POA: Diagnosis not present

## 2022-05-31 HISTORY — DX: Dependence on renal dialysis: N18.6

## 2022-05-31 HISTORY — DX: Dependence on renal dialysis: Z99.2

## 2022-05-31 LAB — CBC WITH DIFFERENTIAL/PLATELET
Abs Immature Granulocytes: 0.13 10*3/uL — ABNORMAL HIGH (ref 0.00–0.07)
Basophils Absolute: 0.1 10*3/uL (ref 0.0–0.1)
Basophils Relative: 1 %
Eosinophils Absolute: 0.3 10*3/uL (ref 0.0–0.5)
Eosinophils Relative: 4 %
HCT: 33.9 % — ABNORMAL LOW (ref 39.0–52.0)
Hemoglobin: 11.4 g/dL — ABNORMAL LOW (ref 13.0–17.0)
Immature Granulocytes: 1 %
Lymphocytes Relative: 16 %
Lymphs Abs: 1.5 10*3/uL (ref 0.7–4.0)
MCH: 31.4 pg (ref 26.0–34.0)
MCHC: 33.6 g/dL (ref 30.0–36.0)
MCV: 93.4 fL (ref 80.0–100.0)
Monocytes Absolute: 0.6 10*3/uL (ref 0.1–1.0)
Monocytes Relative: 6 %
Neutro Abs: 6.7 10*3/uL (ref 1.7–7.7)
Neutrophils Relative %: 72 %
Platelets: 236 10*3/uL (ref 150–400)
RBC: 3.63 MIL/uL — ABNORMAL LOW (ref 4.22–5.81)
RDW: 13.9 % (ref 11.5–15.5)
WBC: 9.3 10*3/uL (ref 4.0–10.5)
nRBC: 0 % (ref 0.0–0.2)

## 2022-05-31 LAB — TROPONIN I (HIGH SENSITIVITY)
Troponin I (High Sensitivity): 9 ng/L (ref <18)
Troponin I (High Sensitivity): 11 ng/L (ref <18)

## 2022-05-31 LAB — HEPATITIS C ANTIBODY: HCV Ab: NONREACTIVE

## 2022-05-31 LAB — PROTIME-INR
INR: 3.2 — ABNORMAL HIGH (ref 0.8–1.2)
Prothrombin Time: 32.8 seconds — ABNORMAL HIGH (ref 11.4–15.2)

## 2022-05-31 LAB — RESP PANEL BY RT-PCR (FLU A&B, COVID) ARPGX2
Influenza A by PCR: NEGATIVE
Influenza B by PCR: NEGATIVE
SARS Coronavirus 2 by RT PCR: NEGATIVE

## 2022-05-31 LAB — BLOOD GAS, VENOUS
Acid-base deficit: 5.2 mmol/L — ABNORMAL HIGH (ref 0.0–2.0)
Bicarbonate: 20 mmol/L (ref 20.0–28.0)
O2 Saturation: 82.1 %
Patient temperature: 37
pCO2, Ven: 37 mmHg — ABNORMAL LOW (ref 44–60)
pH, Ven: 7.34 (ref 7.25–7.43)
pO2, Ven: 48 mmHg — ABNORMAL HIGH (ref 32–45)

## 2022-05-31 LAB — BASIC METABOLIC PANEL
Anion gap: 15 (ref 5–15)
BUN: 54 mg/dL — ABNORMAL HIGH (ref 8–23)
CO2: 18 mmol/L — ABNORMAL LOW (ref 22–32)
Calcium: 8 mg/dL — ABNORMAL LOW (ref 8.9–10.3)
Chloride: 103 mmol/L (ref 98–111)
Creatinine, Ser: 9.27 mg/dL — ABNORMAL HIGH (ref 0.61–1.24)
GFR, Estimated: 6 mL/min — ABNORMAL LOW (ref >60)
Glucose, Bld: 92 mg/dL (ref 70–99)
Potassium: 4.4 mmol/L (ref 3.5–5.1)
Sodium: 136 mmol/L (ref 135–145)

## 2022-05-31 LAB — HEMOGLOBIN A1C
Hgb A1c MFr Bld: 5.3 % (ref 4.8–5.6)
Mean Plasma Glucose: 105.41 mg/dL

## 2022-05-31 LAB — HEPATITIS B CORE ANTIBODY, TOTAL: Hep B Core Total Ab: NONREACTIVE

## 2022-05-31 LAB — SEDIMENTATION RATE: Sed Rate: 76 mm/hr — ABNORMAL HIGH (ref 0–16)

## 2022-05-31 LAB — HEPATITIS B SURFACE ANTIGEN: Hepatitis B Surface Ag: NONREACTIVE

## 2022-05-31 LAB — HEPATITIS B SURFACE ANTIBODY,QUALITATIVE: Hep B S Ab: REACTIVE — AB

## 2022-05-31 LAB — C-REACTIVE PROTEIN: CRP: 3 mg/dL — ABNORMAL HIGH (ref <1.0)

## 2022-05-31 LAB — HIV ANTIBODY (ROUTINE TESTING W REFLEX): HIV Screen 4th Generation wRfx: NONREACTIVE

## 2022-05-31 LAB — BRAIN NATRIURETIC PEPTIDE: B Natriuretic Peptide: 462.7 pg/mL — ABNORMAL HIGH (ref 0.0–100.0)

## 2022-05-31 MED ORDER — WARFARIN - PHARMACIST DOSING INPATIENT: Freq: Every day | Status: DC

## 2022-05-31 MED ORDER — ACETAMINOPHEN 650 MG RE SUPP: 650.0000 mg | Freq: Four times a day (QID) | RECTAL | Status: DC | PRN

## 2022-05-31 MED ORDER — WARFARIN SODIUM 1 MG PO TABS
1.0000 mg | ORAL_TABLET | Freq: Once | ORAL | Status: AC
  Administered 2022-05-31: 1 mg via ORAL
  Filled 2022-05-31: qty 1

## 2022-05-31 MED ORDER — COLCHICINE 0.6 MG PO TABS
0.6000 mg | ORAL_TABLET | Freq: Every day | ORAL | Status: DC
  Administered 2022-05-31: 0.6 mg via ORAL
  Filled 2022-05-31 (×2): qty 1

## 2022-05-31 MED ORDER — ALLOPURINOL 100 MG PO TABS
100.0000 mg | ORAL_TABLET | Freq: Every evening | ORAL | Status: DC
  Administered 2022-05-31 – 2022-06-05 (×6): 100 mg via ORAL
  Filled 2022-05-31 (×6): qty 1

## 2022-05-31 MED ORDER — IPRATROPIUM-ALBUTEROL 0.5-2.5 (3) MG/3ML IN SOLN
3.0000 mL | Freq: Once | RESPIRATORY_TRACT | Status: AC
  Administered 2022-05-31: 3 mL via RESPIRATORY_TRACT
  Filled 2022-05-31: qty 3

## 2022-05-31 MED ORDER — ATORVASTATIN CALCIUM 40 MG PO TABS
40.0000 mg | ORAL_TABLET | Freq: Every evening | ORAL | Status: DC
  Administered 2022-05-31 – 2022-06-03 (×4): 40 mg via ORAL
  Filled 2022-05-31 (×4): qty 1

## 2022-05-31 MED ORDER — LORATADINE 10 MG PO TABS
10.0000 mg | ORAL_TABLET | Freq: Every evening | ORAL | Status: DC
  Administered 2022-05-31 – 2022-06-05 (×6): 10 mg via ORAL
  Filled 2022-05-31 (×6): qty 1

## 2022-05-31 MED ORDER — SODIUM CHLORIDE (PF) 0.9 % IJ SOLN
INTRAMUSCULAR | Status: AC
  Filled 2022-05-31: qty 50

## 2022-05-31 MED ORDER — ALBUTEROL SULFATE (2.5 MG/3ML) 0.083% IN NEBU
2.5000 mg | INHALATION_SOLUTION | RESPIRATORY_TRACT | Status: DC | PRN
  Administered 2022-05-31: 2.5 mg via RESPIRATORY_TRACT
  Filled 2022-05-31: qty 3

## 2022-05-31 MED ORDER — SODIUM CHLORIDE 0.9 % IV SOLN
2.0000 g | INTRAVENOUS | Status: DC
  Administered 2022-06-02: 2 g via INTRAVENOUS
  Filled 2022-05-31 (×2): qty 20

## 2022-05-31 MED ORDER — PROCHLORPERAZINE EDISYLATE 10 MG/2ML IJ SOLN: 10.0000 mg | Freq: Four times a day (QID) | INTRAMUSCULAR | Status: DC | PRN

## 2022-05-31 MED ORDER — SODIUM CHLORIDE 0.9 % IV SOLN
1.0000 g | Freq: Once | INTRAVENOUS | Status: AC
  Administered 2022-05-31: 1 g via INTRAVENOUS
  Filled 2022-05-31: qty 10

## 2022-05-31 MED ORDER — SODIUM CHLORIDE 0.9 % IV SOLN
500.0000 mg | Freq: Once | INTRAVENOUS | Status: AC
  Administered 2022-05-31: 500 mg via INTRAVENOUS
  Filled 2022-05-31: qty 5

## 2022-05-31 MED ORDER — SODIUM CHLORIDE 0.9 % IV SOLN: 500.0000 mg | INTRAVENOUS | Status: DC

## 2022-05-31 MED ORDER — TRAMADOL HCL 50 MG PO TABS: 50.0000 mg | ORAL_TABLET | Freq: Two times a day (BID) | ORAL | Status: DC | PRN

## 2022-05-31 MED ORDER — ACETAMINOPHEN 325 MG PO TABS
650.0000 mg | ORAL_TABLET | Freq: Four times a day (QID) | ORAL | Status: DC | PRN
  Administered 2022-06-02 – 2022-06-04 (×3): 650 mg via ORAL
  Filled 2022-05-31 (×4): qty 2

## 2022-05-31 MED ORDER — IOHEXOL 350 MG/ML SOLN
80.0000 mL | Freq: Once | INTRAVENOUS | Status: AC | PRN
  Administered 2022-05-31: 69 mL via INTRAVENOUS

## 2022-05-31 MED ORDER — FLUTICASONE PROPIONATE 50 MCG/ACT NA SUSP: 1.0000 | Freq: Every day | NASAL | Status: DC | PRN

## 2022-05-31 MED ORDER — GUAIFENESIN 100 MG/5ML PO LIQD
5.0000 mL | ORAL | Status: DC | PRN
  Filled 2022-05-31: qty 10

## 2022-05-31 MED ORDER — AMIODARONE HCL 200 MG PO TABS
200.0000 mg | ORAL_TABLET | Freq: Every evening | ORAL | Status: DC
  Administered 2022-05-31 – 2022-06-05 (×6): 200 mg via ORAL
  Filled 2022-05-31 (×6): qty 1

## 2022-05-31 MED ORDER — CHLORHEXIDINE GLUCONATE CLOTH 2 % EX PADS: 6.0000 | MEDICATED_PAD | Freq: Every day | CUTANEOUS | Status: DC

## 2022-05-31 NOTE — Progress Notes (Addendum)
Pt being admitted for chest pain, SOB, possible pericarditis. Asked to see for dialysis. Pt is TTS at [facility].  Pt seen in ED.  No vol overload on exam or CXR. Does not need urgent HD today, will plan for HD tomorrow am, then resume TTS on Thursday. Full note to follow tomorrow.   Kelly Splinter, MD 05/31/2022, 5:05 PM

## 2022-05-31 NOTE — ED Provider Notes (Addendum)
[facility] DEPT Provider Note   CSN: [id number] Arrival date & time: 05/31/22  0414     History  Chief Complaint  Patient presents with   Shortness of Breath    George Lewis. is a 66 y.o. male.  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  Symptoms ongoing for 1 to 2 weeks, gradually worsening.  Orthopnea.  Productive cough with yellow/clear sputum.  Dyspnea on exertion, arthralgias, subjective fever last few days.  Recently traveled for a wedding.  Unsure of sick contacts.  Compliant with home medications.  No recent travel out of the country.  Was scheduled for dialysis this morning but came to the ED instead.  Reports his weights have been stable, no significant lower extremity swelling.     Past Medical History:  Diagnosis Date   Anemia    Arthritis    knees   Atrial fibrillation ([place])    Bilateral renal cysts 03/23/2018   Noted MRI ABD   Cancer ([place])    right kidney cancer   Cancer ([place])    pancreatic   Cholelithiasis 03/19/2018   noted on CT AB/Pelvis   CKD (chronic kidney disease), stage IV [facility])    nephrologist-- dr Hollie Salk [name]);  05-01-2018 has not started dialysis, scheduled for AV fistula creation 05-07-2018   Diabetes mellitus without complication ([place])    Diverticulosis of colon 04/19/2018   Noted on CT abd/pelvis   Dysrhythmia    afib   GERD (gastroesophageal reflux disease)    Hepatic steatosis 03/19/2018   Mild diffuse, noted on CT AB/Pelvis   History of kidney stones    History of pulmonary embolus (PE) 03/2008   treated with coumadin for 6 months   History of sepsis 04/20/2018   per  discharge note , probable UTI   Hypertension    IBS (irritable bowel syndrome)    Nocturia    OSA on CPAP    uses CPAP nightly   Pancreatic lesion    0.4cm cystic per CT 07/ 2019   Peripheral vascular disease (HCC)    Pneumonia    x 1   Pulmonary nodule    Solid 29m Right Lower Lobe   Right renal mass 04/10/2018   new dx--  scheduled for nephrectomy 05-16-2018   S/P ureteral stent placement: 04/16/2018 04/19/2018    Past Surgical History:  Procedure Laterality Date   AV FISTULA PLACEMENT Left 05/07/2018   Procedure: Creation of Left arm Radiocephalic Fistula;  Surgeon: CMarty Heck MD;  [place] [facility]  Service: Vascular;  Laterality: Left;   AV FISTULA PLACEMENT Left 05/15/2019   Procedure: Creation of Brachiocephalic fistula left arm;  Surgeon: CWaynetta Sandy MD;  [place] [facility]  Service: Vascular;  Laterality: Left;   BFranquezLeft 07/15/2019   Procedure: BASILIC VEIN TRANSPOSITION SECOND STAGE;  Surgeon: CWaynetta Sandy MD;  [facility]  Service: Vascular;  Laterality: Left;   BRONCHIAL BRUSHINGS  08/25/2020   Procedure: BRONCHIAL BRUSHINGS;  Surgeon: IGarner Nash DO;  [facility];  Service: Pulmonary;;   BRONCHIAL NEEDLE ASPIRATION BIOPSY  08/25/2020   Procedure: BRONCHIAL NEEDLE ASPIRATION BIOPSIES;  Surgeon: IGarner Nash DO;  Location: [facility];  Service: Pulmonary;;   BRONCHIAL WASHINGS  08/25/2020   Procedure: BRONCHIAL WASHINGS;  Surgeon: Garner Nash, DO;  [facility];  Service: Pulmonary;;   COLONOSCOPY     CYSTOSCOPY/RETROGRADE/URETEROSCOPY/STONE EXTRACTION WITH BASKET  x2 last one 1990s approx.   CYSTOSCOPY/URETEROSCOPY/HOLMIUM LASER/STENT PLACEMENT Left 04/16/2018   Procedure: CYSTOSCOPY/LEFT URETEROSCOPY/LEFT RETROGRADE/STENT PLACEMENT;  Surgeon: Lucas Mallow, MD;  [facility];  Service: Urology;  Laterality: Left;   EUS N/A 05/03/2018   Procedure: UPPER  ENDOSCOPIC ULTRASOUND (EUS) RADIAL;  Surgeon: Milus Banister, MD;  [facility];  Service: Gastroenterology;  Laterality: N/A;   EUS N/A 05/03/2018   Procedure: UPPER ENDOSCOPIC ULTRASOUND (EUS) LINEAR;  Surgeon: Milus Banister, MD;  [facility];  Service: Gastroenterology;  Laterality: N/A;   EUS  10/05/2020   upper GI   EXTRACORPOREAL SHOCK WAVE LITHOTRIPSY  x3  last one 2004 approx.   FINE NEEDLE ASPIRATION N/A 05/03/2018   Procedure: FINE NEEDLE ASPIRATION (FNA) LINEAR;  Surgeon: Milus Banister, MD;  [facility];  Service: Gastroenterology;  Laterality: N/A;   INTERCOSTAL NERVE BLOCK Left 10/16/2020   Procedure: INTERCOSTAL NERVE BLOCK;  Surgeon: Melrose Nakayama, MD;  Location: [facility];  Service: Thoracic;  Laterality: Left;   LAPAROSCOPIC NEPHRECTOMY, HAND ASSISTED Right 05/16/2018   Procedure: HAND ASSISTED LAPAROSCOPIC RIGHT NEPHRECTOMY;  Surgeon: Lucas Mallow, MD;  [facility];  Service: Urology;  Laterality: Right;   left index finger attachment  1980s   3-4 surgeries   NODE DISSECTION Left 10/16/2020   Procedure: NODE DISSECTION;  Surgeon: Melrose Nakayama, MD;  Location: [facility];  Service: Thoracic;  Laterality: Left;   TOE SURGERY  1980s   in beween 2nd and 3rd toes cyst removed   UPPER GI ENDOSCOPY     VIDEO BRONCHOSCOPY WITH ENDOBRONCHIAL NAVIGATION Bilateral 08/25/2020   Procedure: VIDEO BRONCHOSCOPY WITH ENDOBRONCHIAL NAVIGATION;  Surgeon: Garner Nash, DO;  Location: [facility];  Service: Pulmonary;  Laterality: Bilateral;     The history is provided by the patient and the spouse. No language interpreter was used.  Shortness of Breath Associated symptoms: cough and fever   Associated symptoms: no abdominal pain, no chest pain, no headaches, no rash and no vomiting        Home Medications Prior to Admission medications   Medication Sig Start Date End Date Taking? Authorizing Provider  Accu-Chek FastClix Lancets  MISC Use to check fasting blood sugar and 2 hrs after largest meal. 12/26/19   Abonza, Maritza, PA-C  acetaminophen (TYLENOL) 500 MG tablet Take 1,000 mg by mouth every 8 (eight) hours as needed for mild pain or headache.    [provider]  allopurinol (ZYLOPRIM) 100 MG tablet Take 1 tablet (100 mg total) by mouth every evening. 04/08/21   Lorrene Reid, PA-C  amiodarone (PACERONE) 200 MG tablet Take 1 tablet (200 mg total) by mouth every evening. 05/09/22   Elouise Munroe, MD  atorvastatin (LIPITOR) 40 MG tablet Take 1 tablet (40 mg total) by mouth every evening. 10/19/20   Gold, Wilder Glade, PA-C  axitinib (INLYTA) 5 MG tablet Take 1 tablet (5 mg total) by mouth 2 (two) times daily. Patient not taking: Reported on 05/13/2022 04/22/22   Wyatt Portela, MD  blood glucose meter kit and supplies Dispense based on patient and insurance preference. Use up to four times daily as directed. (FOR ICD-10 E10.9, E11.9). 12/22/18   Danford, Suann Larry, MD  diphenhydramine-acetaminophen (TYLENOL PM) 25-500 MG TABS tablet Take 2 tablets by mouth at bedtime as needed (sleep/headache).    [provider]  ferric citrate (AURYXIA) 1 GM 210 MG(Fe) tablet Take 1 tablet (210 mg total) by mouth daily with supper. 10/19/20   Gold, Wayne E, PA-C  fluticasone (FLONASE) 50 MCG/ACT nasal spray Place 1-2 sprays into both nostrils daily as needed for allergies or rhinitis.    [provider]  gabapentin (NEURONTIN) 100 MG capsule Take 1 capsule (100 mg total) by mouth 2 (two) times daily. Take 1 PO at night for 5 days then go to twice daily 11/04/20   Melrose Nakayama, MD  glucose blood (ACCU-CHEK GUIDE) test strip USE 1 STRIP TO CHECK FASTING BLOOD SUGAR AND 2 HOURS AFTER LARGEST MEAL 08/02/21   Abonza, Maritza, PA-C  insulin glargine (LANTUS) 100 UNIT/ML injection Inject into the skin daily as needed (low blood glucose 180). Unknow dose to give if needed Patient not taking: Reported on 05/13/2022     [provider]  lidocaine-prilocaine (EMLA) cream Apply topically. 01/11/21   [provider]  loratadine (CLARITIN) 10 MG tablet Take 10 mg by mouth every evening.     [provider]  midodrine (PROAMATINE) 10 MG tablet Take 1 tablet (10 mg total) by mouth 3 (three) times daily as needed (for hypotension/dialysis). AS ordered. Take In the evening 10 mg on Mon. Wed and Friday Take 20 mg on Dialysis days Tues, Thurs and Saturday. 10 mg when he gets there and 10 mg half way through. 12/03/21   Elouise Munroe, MD  nitroGLYCERIN (NITROSTAT) 0.4 MG SL tablet Place 1 tablet (0.4 mg total) under the tongue every 5 (five) minutes as needed for chest pain. 03/07/19   Swayze, Ava, DO  omeprazole (PRILOSEC OTC) 20 MG tablet Take 40 mg by mouth daily before breakfast.     [provider]  pembrolizumab (KEYTRUDA) 100 MG/4ML SOLN Inject into the vein. 01/07/21   [provider]  prochlorperazine (COMPAZINE) 10 MG tablet Take 1 tablet (10 mg total) by mouth every 6 (six) hours as needed for nausea or vomiting. 03/09/22   Wyatt Portela, MD  Sennosides (SENOKOT EXTRA STRENGTH) 17.2 MG TABS Take 34.4 mg by mouth at bedtime.    [provider]  traMADol (ULTRAM) 50 MG tablet TAKE 1 TABLET BY MOUTH EVERY 6 HOURS AS NEEDED FOR PAIN 02/10/22   Wyatt Portela, MD  warfarin (COUMADIN) 3 MG tablet TAKE 1 TO 2 TABLETS BY MOUTH DAILY AS DIRECTED BY  THE  COUMADIN  CLINIC 08/12/21   Elouise Munroe, MD      Allergies    Penicillins    Review of Systems   Review of Systems  Constitutional:  Positive for fatigue and fever. Negative for chills.  HENT:  Positive for congestion, postnasal drip and rhinorrhea. Negative for facial swelling and trouble swallowing.   Eyes:  Negative for photophobia and visual disturbance.  Respiratory:  Positive for cough and shortness of breath.   Cardiovascular:  Negative for chest pain and palpitations.  Gastrointestinal:  Negative for  abdominal pain, nausea and vomiting.  Endocrine: Negative for polydipsia and polyuria.  Genitourinary:  Negative for difficulty urinating and hematuria.  Musculoskeletal:  Positive for arthralgias. Negative for gait problem and joint swelling.  Skin:  Negative for pallor and rash.  Neurological:  Negative for syncope and headaches.  Psychiatric/Behavioral:  Negative for agitation and confusion.     Physical Exam Updated  Vital Signs BP 138/78   Pulse 84   Temp 98.2 F (36.8 C) (Oral)   Resp (!) 28   SpO2 97%  Physical Exam Vitals and nursing note reviewed.  Constitutional:      General: He is not in acute distress.    Appearance: Normal appearance. He is well-developed. He is obese. He is not ill-appearing or diaphoretic.  HENT:     Head: Normocephalic and atraumatic.     Right Ear: External ear normal.     Left Ear: External ear normal.     Mouth/Throat:     Mouth: Mucous membranes are moist.  Eyes:     General: No scleral icterus. Cardiovascular:     Rate and Rhythm: Normal rate and regular rhythm.     Pulses: Normal pulses.     Heart sounds: Normal heart sounds.  Pulmonary:     Effort: Pulmonary effort is normal. No tachypnea, accessory muscle usage or respiratory distress.     Breath sounds: Normal breath sounds.  Abdominal:     General: Abdomen is flat.     Palpations: Abdomen is soft.     Tenderness: There is no abdominal tenderness. There is no guarding or rebound.  Musculoskeletal:        General: Normal range of motion.     Cervical back: Normal range of motion.     Right lower leg: No edema.     Left lower leg: No edema.  Skin:    General: Skin is warm and dry.     Capillary Refill: Capillary refill takes less than 2 seconds.       Neurological:     Mental Status: He is alert and oriented to person, place, and time.     GCS: GCS eye subscore is 4. GCS verbal subscore is 5. GCS motor subscore is 6.  Psychiatric:        Mood and Affect: Mood normal.         Behavior: Behavior normal.     ED Results / Procedures / Treatments   Labs (all labs ordered are listed, but only abnormal results are displayed) Labs Reviewed  BASIC METABOLIC PANEL - Abnormal; Notable for the following components:      Result Value   CO2 18 (*)    BUN 54 (*)    Creatinine, Ser 9.27 (*)    Calcium 8.0 (*)    GFR, Estimated 6 (*)    All other components within normal limits  BRAIN NATRIURETIC PEPTIDE - Abnormal; Notable for the following components:   B Natriuretic Peptide 462.7 (*)    All other components within normal limits  BLOOD GAS, VENOUS - Abnormal; Notable for the following components:   pCO2, Ven 37 (*)    pO2, Ven 48 (*)    Acid-base deficit 5.2 (*)    All other components within normal limits  RESP PANEL BY RT-PCR (FLU A&B, COVID) ARPGX2  CBC WITH DIFFERENTIAL/PLATELET    EKG None  Radiology DG Chest 2 View  Result Date: 05/31/2022 CLINICAL DATA:  Lung cancer.  Increasing shortness of breath EXAM: CHEST - 2 VIEW COMPARISON:  11/20/2021 FINDINGS: Low volume chest. Cardiomegaly and vascular pedicle widening. There is no edema, air bronchogram, effusion, or pneumothorax. Chronic blunting at the lateral left costophrenic sulcus which is likely fatty based on comparison CT. Streaky indistinct density at the right lung base. IMPRESSION: 1. Mild right base opacity with low lung volumes favoring atelectasis. 2. Chronic cardiomegaly. Electronically Signed  By: Jorje Guild M.D.   On: 05/31/2022 06:03    Procedures Procedures    Medications Ordered in ED Medications  ipratropium-albuterol (DUONEB) 0.5-2.5 (3) MG/3ML nebulizer solution 3 mL (3 mLs Nebulization Given 05/31/22 5916)    ED Course/ Medical Decision Making/ A&P                           Medical Decision Making Amount and/or Complexity of Data Reviewed Labs: ordered. Radiology: ordered. ECG/medicine tests: ordered.  Risk Prescription drug management.   This patient presents  to the ED with chief complaint(s) of dib, URI symptoms, with pertinent past medical history of esrd on hd, rcc which further complicates the presenting complaint. The complaint involves an extensive differential diagnosis and also carries with it a high risk of complications and morbidity.    In my evaluation of this patient's dyspnea my DDx includes, but is not limited to, pneumonia, pulmonary embolism, pneumothorax, pulmonary edema, metabolic acidosis, asthma, COPD, cardiac cause, anemia, anxiety, etc.  . Serious etiologies were considered.   The initial plan is to screening labs, x-ray, DuoNeb   Additional history obtained: Additional history obtained from spouse Records reviewed Primary Care Documents home medications, oncology treatment plan, cardiology/onc notes  Independent labs interpretation:  The following labs were independently interpreted:  VBG stable, BMP with normal potassium, creatinine 9.27, he is ESRD on HD. RVP negative, BNP over 400  Independent visualization of imaging: - I independently visualized the following imaging with scope of interpretation limited to determining acute life threatening conditions related to emergency care: cxr, which revealed atelectasis vs pna  Cardiac monitoring was reviewed and interpreted by myself which shows nsr  Treatment and Reassessment: DuoNeb >> improved  Consultation: - Consulted or discussed management/test interpretation w/ external professional: na  Consideration for admission or further workup: Admission was considered   66 year old male to the ED with complaint of dyspnea, arthralgias, URI symptoms of ESRD, RCC.  Last HD session was Saturday.  Missed his morning dialysis session t today.  Physical exam is reassuring.  He is nonhypoxic on ambient air.  He has orthopnea, dyspnea worsened with exertion.  Labs reviewed, CBC is pending at time of shift change.  Chest x-ray with atelectasis versus pneumonia.  Given patient  history I would favor pneumonia at this time.  We will plan to start oral antibiotics, patient symptoms continue to improve after DuoNeb would favor discharge home but this is pending remainder of laboratory evaluation and patient reassessment.  Patient signed out to incoming EDP pending reassessment, remainder of labs.  Patient is HDS.  On ambient air.  Social Determinants of health: Social History   Tobacco Use   Smoking status: Never   Smokeless tobacco: Never  Vaping Use   Vaping Use: Never used  Substance Use Topics   Alcohol use: Never   Drug use: Never            Final Clinical Impression(s) / ED Diagnoses Final diagnoses:  ESRD on dialysis ([place])  SOB (shortness of breath)    Rx / DC Orders ED Discharge Orders     None         Jeanell Sparrow, DO 05/31/22 [place], [place], DO 05/31/22 [phone]

## 2022-05-31 NOTE — ED Triage Notes (Addendum)
Patient feels like he cannot breathe. Patient recently started a new cancer medication. Patient also reports feeling like he has a cold. Breathing has gotten worse since last week. Patient is out of breath with any movement. Patent is also a T,Th, Sat dialysis patient.

## 2022-05-31 NOTE — ED Notes (Addendum)
[facility] called to have patient transported to [facility].

## 2022-05-31 NOTE — Progress Notes (Signed)
ANTICOAGULATION CONSULT NOTE - Initial Consult  Pharmacy Consult for warfarin Indication: atrial fibrillation  Allergies  Allergen Reactions   Penicillins Rash and Other (See Comments)    Has patient had a PCN reaction causing immediate rash, facial/tongue/throat swelling, SOB or lightheadedness with hypotension: yes Has patient had a PCN reaction causing severe rash involving mucus membranes or skin necrosis: no Has patient had a PCN reaction that required hospitalization: no Has patient had a PCN reaction occurring within the last 10 years: no If all of the above answers are "NO", then may proceed with Cephalosporin use.     Patient Measurements: Height: 6\' 1"  (185.4 cm) Weight: 130.5 kg (287 lb 11.2 oz) IBW/kg (Calculated) : 79.9  Vital Signs: Temp: 97.9 F (36.6 C) (10/03 1802) Temp Source: Oral (10/03 1802) BP: 145/80 (10/03 1802) Pulse Rate: 94 (10/03 1513)  Labs: Recent Labs    05/31/22 0606 05/31/22 0831 05/31/22 1245 05/31/22 1516  HGB  --  11.4*  --   --   HCT  --  33.9*  --   --   PLT  --  236  --   --   LABPROT  --   --  32.8*  --   INR  --   --  3.2*  --   CREATININE 9.27*  --   --   --   TROPONINIHS  --   --  9 11    Estimated Creatinine Clearance: 11.5 mL/min (A) (by C-G formula based on SCr of 9.27 mg/dL (H)).   Medical History: Past Medical History:  Diagnosis Date   Anemia    Arthritis    knees   Atrial fibrillation ([place])    Bilateral renal cysts 03/23/2018   Noted MRI ABD   Cancer ([place])    right kidney cancer   Cancer ([place])    pancreatic   Cholelithiasis 03/19/2018   noted on CT AB/Pelvis   CKD (chronic kidney disease), stage IV [facility])    nephrologist-- dr Hollie Salk [name]);  05-01-2018 has not started dialysis, scheduled for AV fistula creation 05-07-2018   Diabetes mellitus without complication ([place])    Diverticulosis of colon 04/19/2018   Noted on CT abd/pelvis   Dysrhythmia    afib   GERD (gastroesophageal reflux  disease)    Hepatic steatosis 03/19/2018   Mild diffuse, noted on CT AB/Pelvis   History of kidney stones    History of pulmonary embolus (PE) 03/2008   treated with coumadin for 6 months   History of sepsis 04/20/2018   per discharge note , probable UTI   Hypertension    IBS (irritable bowel syndrome)    Nocturia    OSA on CPAP    uses CPAP nightly   Pancreatic lesion    0.4cm cystic per CT 07/ 2019   Peripheral vascular disease (HCC)    Pneumonia    x 1   Pulmonary nodule    Solid 40mm Right Lower Lobe   Right renal mass 04/10/2018   new dx--  scheduled for nephrectomy 05-16-2018   S/P ureteral stent placement: 04/16/2018 04/19/2018    Medications: Warfarin PTA -Home dose: 1.5 mg PO daily on Mon, Fri; 3 mg all other days -Last dose PTA: 05/30/22 @ 1730  Assessment: Pt is a 67 yoM with PMH significant for RCC, afib (on warfarin), ESRD on HD TTS, T2DM. Pt admitted with SOB - concern for pericarditis, PNA. Pharmacy consulted to dose warfarin.   Today, 05/31/22 INR = 3.2 is slightly  above goal CBC: Hgb slightly low. Plt WNL Diet: Heart healthy DDI: No major new DDI - Amiodarone and allopurinol are home medications continued on admission. Antibiotics (azithromycin, ceftriaxone) may enhance effects of warfarin.   Goal of Therapy:  INR 2-3 Monitor platelets by anticoagulation protocol: Yes   Plan:  Give reduced dose of warfarin 1 mg PO this evening INR, CBC with AM labs tomorrow  Lenis Noon, PharmD 05/31/2022,6:15 PM

## 2022-05-31 NOTE — H&P (Signed)
History and Physical    Patient: George Lewis. WIO:[id number] DOB: Jun 16, 1956 DOA: 05/31/2022 DOS: the patient was seen and examined on 05/31/2022 PCP: Lorrene Reid, PA-C  Patient coming from: Home  Chief Complaint:  Chief Complaint  Patient presents with   Shortness of Breath   HPI: George Lewis. is a 66 y.o. male with medical history significant of RCC, A fib on coumadin, HLD, ESRD on HD TTS, DM2. Presenting with shortness of breath. He reports that he was started on a new immunotherapy about 9 or 10 days ago. Since that time, he has felt progressively weak. 7 days ago, he started feeling short of breath. He had a non-productive cough, but no F/N/V. His shortness of breath seemed worse when laying down. He has dull, constant chest pain develop over the last several days.  When his symptoms did not improve this morning, he decided to skip dialysis and come to the ED for evaluation. He denies any other aggravating or alleviating factors.   Review of Systems: As mentioned in the history of present illness. All other systems reviewed and are negative. Past Medical History:  Diagnosis Date   Anemia    Arthritis    knees   Atrial fibrillation ([place])    Bilateral renal cysts 03/23/2018   Noted MRI ABD   Cancer ([place])    right kidney cancer   Cancer ([place])    pancreatic   Cholelithiasis 03/19/2018   noted on CT AB/Pelvis   CKD (chronic kidney disease), stage IV [facility])    nephrologist-- dr Hollie Salk [name]);  05-01-2018 has not started dialysis, scheduled for AV fistula creation 05-07-2018   Diabetes mellitus without complication ([place])    Diverticulosis of colon 04/19/2018   Noted on CT abd/pelvis   Dysrhythmia    afib   GERD (gastroesophageal reflux disease)    Hepatic steatosis 03/19/2018   Mild diffuse, noted on CT AB/Pelvis   History of kidney stones    History of pulmonary embolus (PE) 03/2008   treated with coumadin for 6 months   History of sepsis  04/20/2018   per discharge note , probable UTI   Hypertension    IBS (irritable bowel syndrome)    Nocturia    OSA on CPAP    uses CPAP nightly   Pancreatic lesion    0.4cm cystic per CT 07/ 2019   Peripheral vascular disease (HCC)    Pneumonia    x 1   Pulmonary nodule    Solid 71m Right Lower Lobe   Right renal mass 04/10/2018   new dx--  scheduled for nephrectomy 05-16-2018   S/P ureteral stent placement: 04/16/2018 04/19/2018   Past Surgical History:  Procedure Laterality Date   AV FISTULA PLACEMENT Left 05/07/2018   Procedure: Creation of Left arm Radiocephalic Fistula;  Surgeon: CMarty Heck MD;  [facility]: [facility]  Service: Vascular;  Laterality: Left;   AV FISTULA PLACEMENT Left 05/15/2019   Procedure: Creation of Brachiocephalic fistula left arm;  Surgeon: CWaynetta Sandy MD;  [place] [facility]  Service: Vascular;  Laterality: Left;   [place] 07[place]   Procedure: BASILIC VEIN TRANSPOSITION SECOND STAGE;  Surgeon: CWaynetta Sandy MD;  [place] [facility] [place]  Service: Vascular;  Laterality: Left;   BRONCHIAL BRUSHINGS  08/25/2020   Procedure: BRONCHIAL BRUSHINGS;  Surgeon: IGarner Nash DO;  [facility];  Service: Pulmonary;;   BRONCHIAL NEEDLE ASPIRATION BIOPSY  08/25/2020   Procedure: BRONCHIAL NEEDLE ASPIRATION  BIOPSIES;  Surgeon: Garner Nash, DO;  Location: [place] [facility];  Service: Pulmonary;;   BRONCHIAL WASHINGS  08/25/2020   Procedure: BRONCHIAL WASHINGS;  Surgeon: Garner Nash, DO;  [facility];  Service: Pulmonary;;   COLONOSCOPY     CYSTOSCOPY/RETROGRADE/URETEROSCOPY/STONE EXTRACTION WITH BASKET  x2 last one 1990s approx.   CYSTOSCOPY/URETEROSCOPY/HOLMIUM LASER/STENT PLACEMENT Left 04/16/2018   Procedure: CYSTOSCOPY/LEFT URETEROSCOPY/LEFT RETROGRADE/STENT PLACEMENT;  Surgeon: Lucas Mallow, MD;  [facility];  Service: Urology;  Laterality: Left;   EUS N/A 05/03/2018   Procedure:  UPPER ENDOSCOPIC ULTRASOUND (EUS) RADIAL;  Surgeon: Milus Banister, MD;  [facility];  Service: Gastroenterology;  Laterality: N/A;   EUS N/A 05/03/2018   Procedure: UPPER ENDOSCOPIC ULTRASOUND (EUS) LINEAR;  Surgeon: Milus Banister, MD;  [facility];  Service: Gastroenterology;  Laterality: N/A;   EUS  10/05/2020   upper GI   EXTRACORPOREAL SHOCK WAVE LITHOTRIPSY  x3  last one 2004 approx.   FINE NEEDLE ASPIRATION N/A 05/03/2018   Procedure: FINE NEEDLE ASPIRATION (FNA) LINEAR;  Surgeon: Milus Banister, MD;  [facility];  Service: Gastroenterology;  Laterality: N/A;   INTERCOSTAL NERVE BLOCK Left 10/16/2020   Procedure: INTERCOSTAL NERVE BLOCK;  Surgeon: Melrose Nakayama, MD;  Location: [place];  Service: Thoracic;  Laterality: Left;   LAPAROSCOPIC NEPHRECTOMY, HAND ASSISTED Right 05/16/2018   Procedure: HAND ASSISTED LAPAROSCOPIC RIGHT NEPHRECTOMY;  Surgeon: Lucas Mallow, MD;  [facility];  Service: Urology;  Laterality: Right;   left index finger attachment  1980s   3-4 surgeries   NODE DISSECTION Left 10/16/2020   Procedure: NODE DISSECTION;  Surgeon: Melrose Nakayama, MD;  Location: [place];  Service: Thoracic;  Laterality: Left;   TOE SURGERY  1980s   in beween 2nd and 3rd toes cyst removed   UPPER GI ENDOSCOPY     VIDEO BRONCHOSCOPY WITH ENDOBRONCHIAL NAVIGATION Bilateral 08/25/2020   Procedure: VIDEO BRONCHOSCOPY WITH ENDOBRONCHIAL NAVIGATION;  Surgeon: Garner Nash, DO;  Location: [place];  Service: Pulmonary;  Laterality: Bilateral;   Social History:  reports that he has never smoked. He has never used smokeless tobacco. He reports that he does not drink alcohol and does not use drugs.  Allergies  Allergen Reactions   Penicillins Rash and Other (See Comments)    Has patient had a PCN reaction causing immediate rash, facial/tongue/throat swelling, SOB or lightheadedness with hypotension: yes Has patient had a PCN reaction  causing severe rash involving mucus membranes or skin necrosis: no Has patient had a PCN reaction that required hospitalization: no Has patient had a PCN reaction occurring within the last 10 years: no If all of the above answers are "NO", then may proceed with Cephalosporin use.     Family History  Problem Relation Age of Onset   Alzheimer's disease Mother    Alzheimer's disease Father    Heart disease Father    Heart attack Father    Cancer - Other Sister     Prior to Admission medications   Medication Sig Start Date End Date Taking? Authorizing Provider  allopurinol (ZYLOPRIM) 100 MG tablet Take 1 tablet (100 mg total) by mouth every evening. 04/08/21  Yes Abonza, Herb Grays, PA-C  amiodarone (PACERONE) 200 MG tablet Take 1 tablet (200 mg total) by mouth every evening. 05/09/22  Yes Elouise Munroe, MD  atorvastatin (LIPITOR) 40 MG tablet Take 1 tablet (40 mg total) by mouth every evening. 10/19/20  Yes Gold, Wilder Glade,  PA-C  axitinib (INLYTA) 5 MG tablet Take 1 tablet (5 mg total) by mouth 2 (two) times daily. 04/22/22  Yes Wyatt Portela, MD  diphenhydramine-acetaminophen (TYLENOL PM) 25-500 MG TABS tablet Take 3 tablets by mouth at bedtime as needed (sleep/headache).   Yes [provider]  ferric citrate (AURYXIA) 1 GM 210 MG(Fe) tablet Take 1 tablet (210 mg total) by mouth daily with supper. Patient taking differently: Take 210 mg by mouth 3 (three) times daily with meals. 10/19/20  Yes Gold, Wayne E, PA-C  fluticasone (FLONASE) 50 MCG/ACT nasal spray Place 1 spray into both nostrils daily as needed for allergies or rhinitis.   Yes [provider]  loratadine (CLARITIN) 10 MG tablet Take 10 mg by mouth every evening.    Yes [provider]  Menthol, Topical Analgesic, (BIOFREEZE EX) Apply 1 Application topically as needed (pain).   Yes [provider]  nitroGLYCERIN (NITROSTAT) 0.4 MG SL tablet Place 1 tablet (0.4 mg total) under the tongue every 5  (five) minutes as needed for chest pain. 03/07/19  Yes Swayze, Ava, DO  omeprazole (PRILOSEC OTC) 20 MG tablet Take 40 mg by mouth daily before breakfast.    Yes [provider]  pembrolizumab (KEYTRUDA) 100 MG/4ML SOLN Inject into the vein every 6 (six) weeks. Infused at providers office 01/07/21  Yes [provider]  prochlorperazine (COMPAZINE) 10 MG tablet Take 1 tablet (10 mg total) by mouth every 6 (six) hours as needed for nausea or vomiting. Patient taking differently: Take 10 mg by mouth as needed for nausea or vomiting. 03/09/22  Yes Shadad, Mathis Dad, MD  Sennosides (SENOKOT EXTRA STRENGTH) 17.2 MG TABS Take 51.6 mg by mouth at bedtime as needed (constipation).   Yes [provider]  traMADol (ULTRAM) 50 MG tablet TAKE 1 TABLET BY MOUTH EVERY 6 HOURS AS NEEDED FOR PAIN Patient taking differently: Take 50 mg by mouth as needed for moderate pain. 02/10/22  Yes Wyatt Portela, MD  warfarin (COUMADIN) 3 MG tablet TAKE 1 TO 2 TABLETS BY MOUTH DAILY AS DIRECTED BY  THE  COUMADIN  CLINIC Patient taking differently: Take 1.5-3 mg by mouth See admin instructions. Take 3 mg by mouth in the evening on Sun/Tues/Wed/Thurs/Sat and 1.5 mg on Mon/Fri 08/12/21  Yes Elouise Munroe, MD  blood glucose meter kit and supplies Dispense based on patient and insurance preference. Use up to four times daily as directed. (FOR ICD-10 E10.9, E11.9). 12/22/18   Danford, Suann Larry, MD  gabapentin (NEURONTIN) 100 MG capsule Take 1 capsule (100 mg total) by mouth 2 (two) times daily. Take 1 PO at night for 5 days then go to twice daily Patient not taking: Reported on 05/31/2022 11/04/20   Melrose Nakayama, MD  glucose blood (ACCU-CHEK GUIDE) test strip USE 1 STRIP TO CHECK FASTING BLOOD SUGAR AND 2 HOURS AFTER LARGEST MEAL 08/02/21   Abonza, Maritza, PA-C  insulin glargine (LANTUS) 100 UNIT/ML injection Inject into the skin daily as needed (low blood glucose 180). Unknow dose to give if  needed Patient not taking: Reported on 05/13/2022    [provider]  lidocaine-prilocaine (EMLA) cream Apply topically. Patient not taking: Reported on 05/31/2022 01/11/21   [provider]  midodrine (PROAMATINE) 10 MG tablet Take 1 tablet (10 mg total) by mouth 3 (three) times daily as needed (for hypotension/dialysis). AS ordered. Take In the evening 10 mg on Mon. Wed and Friday Take 20 mg on Dialysis days Tues, Thurs and  Saturday. 10 mg when he gets there and 10 mg half way through. Patient not taking: Reported on 05/31/2022 12/03/21   Elouise Munroe, MD    Physical Exam: Vitals:   05/31/22 1300 05/31/22 1400 05/31/22 1453 05/31/22 1513  BP: (!) 171/105 (!) 177/99  (!) 156/77  Pulse: 98 94  94  Resp: (!) 29 (!) 27  (!) 28  Temp:   97.9 F (36.6 C) 98.6 F (37 C)  TempSrc:   Oral Oral  SpO2: 100% 100%  100%   General: 66 y.o. male resting in bed in NAD Eyes: PERRL, normal sclera ENMT: Nares patent w/o discharge, orophaynx clear, dentition normal, ears w/o discharge/lesions/ulcers Neck: Supple, trachea midline Cardiovascular: tachy, +S1, S2, no m/g/r, equal pulses throughout Respiratory: decreased at bases no w/r/r, normal WOB GI: BS+, NDNT, no masses noted, no organomegaly noted MSK: No e/c/c Neuro: A&O x 3, no focal deficits Psyc: Appropriate interaction and affect, calm/cooperative  Data Reviewed:  Results for orders placed or performed during the hospital encounter of 05/31/22 (from the past 24 hour(s))  Basic metabolic panel     Status: Abnormal   Collection Time: 05/31/22  6:06 AM  Result Value Ref Range   Sodium 136 135 - 145 mmol/L   Potassium 4.4 3.5 - 5.1 mmol/L   Chloride 103 98 - 111 mmol/L   CO2 18 (L) 22 - 32 mmol/L   Glucose, Bld 92 70 - 99 mg/dL   BUN 54 (H) 8 - 23 mg/dL   Creatinine, Ser 9.27 (H) 0.61 - 1.24 mg/dL   Calcium 8.0 (L) 8.9 - 10.3 mg/dL   GFR, Estimated 6 (L) >60 mL/min   Anion gap 15 5 - 15  Brain natriuretic peptide      Status: Abnormal   Collection Time: 05/31/22  6:06 AM  Result Value Ref Range   B Natriuretic Peptide 462.7 (H) 0.0 - 100.0 pg/mL  Resp Panel by RT-PCR (Flu A&B, Covid) Anterior Nasal Swab     Status: None   Collection Time: 05/31/22  6:06 AM   Specimen: Anterior Nasal Swab  Result Value Ref Range   SARS Coronavirus 2 by RT PCR NEGATIVE NEGATIVE   Influenza A by PCR NEGATIVE NEGATIVE   Influenza B by PCR NEGATIVE NEGATIVE  Blood gas, venous (at [facility] and [facility], not at [facility])     Status: Abnormal   Collection Time: 05/31/22  6:40 AM  Result Value Ref Range   pH, Ven 7.34 7.25 - 7.43   pCO2, Ven 37 (L) 44 - 60 mmHg   pO2, Ven 48 (H) 32 - 45 mmHg   Bicarbonate 20.0 20.0 - 28.0 mmol/L   Acid-base deficit 5.2 (H) 0.0 - 2.0 mmol/L   O2 Saturation 82.1 %   Patient temperature 37.0   CBC with Differential     Status: Abnormal   Collection Time: 05/31/22  8:31 AM  Result Value Ref Range   WBC 9.3 4.0 - 10.5 K/uL   RBC 3.63 (L) 4.22 - 5.81 MIL/uL   Hemoglobin 11.4 (L) 13.0 - 17.0 g/dL   HCT 33.9 (L) 39.0 - 52.0 %   MCV 93.4 80.0 - 100.0 fL   MCH 31.4 26.0 - 34.0 pg   MCHC 33.6 30.0 - 36.0 g/dL   RDW 13.9 11.5 - 15.5 %   Platelets 236 150 - 400 K/uL   nRBC 0.0 0.0 - 0.2 %   Neutrophils Relative % 72 %   Neutro Abs 6.7 1.7 - 7.7 K/uL  Lymphocytes Relative 16 %   Lymphs Abs 1.5 0.7 - 4.0 K/uL   Monocytes Relative 6 %   Monocytes Absolute 0.6 0.1 - 1.0 K/uL   Eosinophils Relative 4 %   Eosinophils Absolute 0.3 0.0 - 0.5 K/uL   Basophils Relative 1 %   Basophils Absolute 0.1 0.0 - 0.1 K/uL   Immature Granulocytes 1 %   Abs Immature Granulocytes 0.13 (H) 0.00 - 0.07 K/uL  Protime-INR     Status: Abnormal   Collection Time: 05/31/22 12:45 PM  Result Value Ref Range   Prothrombin Time 32.8 (H) 11.4 - 15.2 seconds   INR 3.2 (H) 0.8 - 1.2  Troponin I (High Sensitivity)     Status: None   Collection Time: 05/31/22 12:45 PM  Result Value Ref Range   Troponin I (High Sensitivity) 9 <18 ng/L   Troponin I (High Sensitivity)     Status: None   Collection Time: 05/31/22  3:16 PM  Result Value Ref Range   Troponin I (High Sensitivity) 11 <18 ng/L   CXR: 1. Mild right base opacity with low lung volumes favoring atelectasis. 2. Chronic cardiomegaly.  CTA chest 1. No evidence of pulmonary embolism. 2. New small right pleural effusion with adjacent consolidation. Pneumonia is not excluded. 3. Unchanged left pleural effusion and adjacent airspace opacity/volume loss. Unchanged consolidation and architectural distortion in the left upper lobe along the major fissure. 4. Unchanged left upper lobe pulmonary nodule measuring 0.9 cm. Additional right lower lobe nodules are obscured by the consolidation and not well assessed. 5. Unchanged 2.0 cm mediastinal lymph node.  EKG: A fib, no st elevation  Assessment and Plan: Chest pain     - admit to inpt, tele @ [facility]     - concern for pericarditis     - EDP spoke with cards, reasonable to start colchicine; cards will follow, appreciate assistance     - echo  RLL PNA     - started on CAP coverage in ED; continue for now     - PRN nebs, guaifenesin     - O2 as necessary  ESRD on HD TTS     - nephrology consulted, appreciate assistance  RCC     - continue follow up with Dr. Alen Blew; he is aware of the patient's admission  PAF     - continue home regimen  HLD     - continue home regimen  HTN     - continue home regimen  DM2     - diet controlled     - A1c, DM diet; check daily glucose for now  Anemia of chronic disease     - at baseline, follow  Advance Care Planning:   Code Status: FULL  Consults: Nephrology, Cardiology  Family Communication: with wife at bedside  Severity of Illness: The appropriate patient status for this patient is INPATIENT. Inpatient status is judged to be reasonable and necessary in order to provide the required intensity of service to ensure the patient's safety. The patient's presenting  symptoms, physical exam findings, and initial radiographic and laboratory data in the context of their chronic comorbidities is felt to place them at high risk for further clinical deterioration. Furthermore, it is not anticipated that the patient will be medically stable for discharge from the hospital within 2 midnights of admission.   * I certify that at the point of admission it is my clinical judgment that the patient will require inpatient hospital care spanning beyond 2 midnights  from the point of admission due to high intensity of service, high risk for further deterioration and high frequency of surveillance required.*  Author: Jonnie Finner, DO 05/31/2022 4:10 PM  For on call review [email or web address].

## 2022-05-31 NOTE — ED Provider Notes (Signed)
  Physical Exam  BP 138/78   Pulse 84   Temp 98.2 F (36.8 C) (Oral)   Resp (!) 28   SpO2 97%     Procedures  Procedures  ED Course / MDM    Medical Decision Making Amount and/or Complexity of Data Reviewed Labs: ordered. Radiology: ordered. ECG/medicine tests: ordered.  Risk Prescription drug management. Decision regarding hospitalization.   59M, presenting with a productive cough, some opacity at the base on CXR concerning for PNA. Plan to follow-up on labs a reassess.  CT angiogram was performed.  The patient was significantly tachypneic, endorsing pleuritic chest pain, worse when lying flat, better when sitting up.  He was unable to fully lie flat for CTA without being placed on oxygen.  He was then placed on 2 L O2 via nasal cannula due to persistent tachypnea.    CTA revealed the following: IMPRESSION:  1. No evidence of pulmonary embolism.  2. New small right pleural effusion with adjacent consolidation.  Pneumonia is not excluded.  3. Unchanged left pleural effusion and adjacent airspace  opacity/volume loss. Unchanged consolidation and architectural  distortion in the left upper lobe along the major fissure.  4. Unchanged left upper lobe pulmonary nodule measuring 0.9 cm.  Additional right lower lobe nodules are obscured by the  consolidation and not well assessed.  5. Unchanged 2.0 cm mediastinal lymph node.    Due to the finding of pleural effusion with some consolidation unable to exclude pneumonia, the patient was covered with IV Rocephin and azithromycin.  He continues to endorse pleuritic chest pain.  He has no evidence of pulmonary edema.  COVID-19 and influenza PCR testing was negative, VBG was unremarkable and troponin was normal, CBC was without a leukocytosis and mild anemia noted to 11.4, BNP was moderately nonspecifically elevated to 463.  I did review the patient's medical record and find that cardiology had evaluated the patient in clinic 2  weeks ago prior to starting his immunotherapy regimen.  He had been counseled on the potential side effect of pericarditis in the setting of this regimen.  He continues to endorse pleuritic chest pain and dyspnea with worsening pain that is sharp and pleuritic when lying flat.  His symptoms are improved only when lying forward.  He remains persistently tachypneic.  I discussed the patient's case with on-call [facility] cardiology who recommended starting the patient on colchicine given clinical concern for pericarditis.  Will see the patient in consultation in the hospital if hospitalist requests.  The patient's EKG was reviewed with no evidence of ST segment elevations and no PR depressions.  Clinically I do have some concern for pericarditis in the setting of the patient's symptoms.  He does not have a clear friction rub on exam.  Due to this concern, hospitalist medicine was consulted for admission, Dr. Marylyn Ishihara accepting in admission.  Plan for admission to [facility], cardiology consultation inpatient, likely TTE inpatient.  Patient updated on plan of care bedside.   Regan Lemming, MD 05/31/22 [phone]

## 2022-05-31 NOTE — Discharge Instructions (Addendum)
It was a pleasure caring for you today in the emergency department. ° °Please return to the emergency department for any worsening or worrisome symptoms. ° ° °

## 2022-05-31 NOTE — ED Notes (Signed)
Nurse ambulated patient in hallway he remained stable at 99% RA 107 HR he experienced SOB and slight chest tightness. He was able to complete ambulation and make it safely back to room.

## 2022-06-01 ENCOUNTER — Inpatient Hospital Stay (HOSPITAL_COMMUNITY): Payer: Medicare Other

## 2022-06-01 ENCOUNTER — Encounter (HOSPITAL_COMMUNITY): Payer: Self-pay | Admitting: Internal Medicine

## 2022-06-01 DIAGNOSIS — Z992 Dependence on renal dialysis: Secondary | ICD-10-CM

## 2022-06-01 DIAGNOSIS — N186 End stage renal disease: Secondary | ICD-10-CM

## 2022-06-01 DIAGNOSIS — C641 Malignant neoplasm of right kidney, except renal pelvis: Secondary | ICD-10-CM

## 2022-06-01 DIAGNOSIS — R008 Other abnormalities of heart beat: Secondary | ICD-10-CM

## 2022-06-01 DIAGNOSIS — J9601 Acute respiratory failure with hypoxia: Secondary | ICD-10-CM | POA: Diagnosis not present

## 2022-06-01 DIAGNOSIS — I10 Essential (primary) hypertension: Secondary | ICD-10-CM

## 2022-06-01 DIAGNOSIS — R0602 Shortness of breath: Secondary | ICD-10-CM

## 2022-06-01 DIAGNOSIS — J90 Pleural effusion, not elsewhere classified: Secondary | ICD-10-CM

## 2022-06-01 DIAGNOSIS — I200 Unstable angina: Secondary | ICD-10-CM | POA: Diagnosis not present

## 2022-06-01 DIAGNOSIS — E7800 Pure hypercholesterolemia, unspecified: Secondary | ICD-10-CM

## 2022-06-01 DIAGNOSIS — I480 Paroxysmal atrial fibrillation: Secondary | ICD-10-CM

## 2022-06-01 LAB — ECHOCARDIOGRAM COMPLETE
AR max vel: 1.31 cm2
AV Area VTI: 1.44 cm2
AV Area mean vel: 1.27 cm2
AV Mean grad: 18 mmHg
AV Peak grad: 29.1 mmHg
Ao pk vel: 2.7 m/s
Area-P 1/2: 5.38 cm2
Height: 73 in
S' Lateral: 3.8 cm
Weight: 4589.1 oz

## 2022-06-01 LAB — CBC
HCT: 31.4 % — ABNORMAL LOW (ref 39.0–52.0)
Hemoglobin: 10.5 g/dL — ABNORMAL LOW (ref 13.0–17.0)
MCH: 31.5 pg (ref 26.0–34.0)
MCHC: 33.4 g/dL (ref 30.0–36.0)
MCV: 94.3 fL (ref 80.0–100.0)
Platelets: 179 10*3/uL (ref 150–400)
RBC: 3.33 MIL/uL — ABNORMAL LOW (ref 4.22–5.81)
RDW: 14.4 % (ref 11.5–15.5)
WBC: 9.3 10*3/uL (ref 4.0–10.5)
nRBC: 0 % (ref 0.0–0.2)

## 2022-06-01 LAB — COMPREHENSIVE METABOLIC PANEL
ALT: 11 U/L (ref 0–44)
AST: 17 U/L (ref 15–41)
Albumin: 3.2 g/dL — ABNORMAL LOW (ref 3.5–5.0)
Alkaline Phosphatase: 37 U/L — ABNORMAL LOW (ref 38–126)
Anion gap: 16 — ABNORMAL HIGH (ref 5–15)
BUN: 57 mg/dL — ABNORMAL HIGH (ref 8–23)
CO2: 16 mmol/L — ABNORMAL LOW (ref 22–32)
Calcium: 7.8 mg/dL — ABNORMAL LOW (ref 8.9–10.3)
Chloride: 101 mmol/L (ref 98–111)
Creatinine, Ser: 10.36 mg/dL — ABNORMAL HIGH (ref 0.61–1.24)
GFR, Estimated: 5 mL/min — ABNORMAL LOW (ref >60)
Glucose, Bld: 83 mg/dL (ref 70–99)
Potassium: 4.6 mmol/L (ref 3.5–5.1)
Sodium: 133 mmol/L — ABNORMAL LOW (ref 135–145)
Total Bilirubin: 0.5 mg/dL (ref 0.3–1.2)
Total Protein: 6.8 g/dL (ref 6.5–8.1)

## 2022-06-01 LAB — PROTIME-INR
INR: 2.4 — ABNORMAL HIGH (ref 0.8–1.2)
Prothrombin Time: 26.3 seconds — ABNORMAL HIGH (ref 11.4–15.2)

## 2022-06-01 LAB — GLUCOSE, CAPILLARY
Glucose-Capillary: 88 mg/dL (ref 70–99)
Glucose-Capillary: 89 mg/dL (ref 70–99)
Glucose-Capillary: 121 mg/dL — ABNORMAL HIGH (ref 70–99)
Glucose-Capillary: 101 mg/dL — ABNORMAL HIGH (ref 70–99)

## 2022-06-01 MED ORDER — WARFARIN SODIUM 3 MG PO TABS
3.0000 mg | ORAL_TABLET | Freq: Once | ORAL | Status: AC
  Administered 2022-06-01: 3 mg via ORAL
  Filled 2022-06-01: qty 1

## 2022-06-01 MED ORDER — PERFLUTREN LIPID MICROSPHERE
1.0000 mL | INTRAVENOUS | Status: DC | PRN
  Administered 2022-06-01: 4 mL via INTRAVENOUS

## 2022-06-01 MED ORDER — AZITHROMYCIN 250 MG PO TABS
500.0000 mg | ORAL_TABLET | Freq: Every day | ORAL | Status: AC
  Administered 2022-06-01 – 2022-06-02 (×2): 500 mg via ORAL
  Filled 2022-06-01 (×2): qty 2

## 2022-06-01 MED ORDER — COLCHICINE 0.6 MG PO TABS: 0.6000 mg | ORAL_TABLET | ORAL | Status: DC

## 2022-06-01 MED ORDER — CHLORHEXIDINE GLUCONATE CLOTH 2 % EX PADS: 6.0000 | MEDICATED_PAD | Freq: Every day | CUTANEOUS | Status: DC

## 2022-06-01 NOTE — Hospital Course (Addendum)
George Lewis. is a 66 y.o. male with medical history significant of RCC, A fib on coumadin, HLD, ESRD on HD TTS, DM2.  Presented to the hospital with complaints of progressive worsening shortness of breath ongoing for last 7 days after starting to take new medication for his RCC INLYTA. Cardiology and nephrology consulted. Underwent cardiac catheterization on 10/6. Due to patient's ongoing numbness or shortness of breath as well as significantly high right-heart cath pressures advanced heart failure services was consulted.  Patient currently undergoing bubble study tomorrow.

## 2022-06-01 NOTE — Progress Notes (Signed)
ANTICOAGULATION CONSULT NOTE - Initial Consult  Pharmacy Consult for warfarin Indication: atrial fibrillation  Allergies  Allergen Reactions   Penicillins Rash and Other (See Comments)    Has patient had a PCN reaction causing immediate rash, facial/tongue/throat swelling, SOB or lightheadedness with hypotension: yes Has patient had a PCN reaction causing severe rash involving mucus membranes or skin necrosis: no Has patient had a PCN reaction that required hospitalization: no Has patient had a PCN reaction occurring within the last 10 years: no If all of the above answers are "NO", then may proceed with Cephalosporin use.     Patient Measurements: Height: 6\' 1"  (185.4 cm) Weight: 130.2 kg (287 lb) IBW/kg (Calculated) : 79.9  Vital Signs: Temp: 98.3 F (36.8 C) (10/04 0449) Temp Source: Oral (10/04 0449) BP: 145/74 (10/04 0449) Pulse Rate: 94 (10/03 2218)  Labs: Recent Labs    05/31/22 0606 05/31/22 0831 05/31/22 1245 05/31/22 1516 06/01/22 0109  HGB  --  11.4*  --   --  10.5*  HCT  --  33.9*  --   --  31.4*  PLT  --  236  --   --  179  LABPROT  --   --  32.8*  --  26.3*  INR  --   --  3.2*  --  2.4*  CREATININE 9.27*  --   --   --  10.36*  TROPONINIHS  --   --  9 11  --      Estimated Creatinine Clearance: 10.3 mL/min (A) (by C-G formula based on SCr of 10.36 mg/dL (H)).   Medical History: Past Medical History:  Diagnosis Date   Anemia    Arthritis    knees   Atrial fibrillation ([place])    Bilateral renal cysts 03/23/2018   Noted MRI ABD   Cancer ([place])    right kidney cancer   Cancer ([place])    pancreatic   Cholelithiasis 03/19/2018   noted on CT AB/Pelvis   CKD (chronic kidney disease), stage IV [facility])    nephrologist-- dr Hollie Salk [name]);  05-01-2018 has not started dialysis, scheduled for AV fistula creation 05-07-2018   Diabetes mellitus without complication ([place])    Diverticulosis of colon 04/19/2018   Noted on CT abd/pelvis   Dysrhythmia     afib   GERD (gastroesophageal reflux disease)    Hepatic steatosis 03/19/2018   Mild diffuse, noted on CT AB/Pelvis   History of kidney stones    History of pulmonary embolus (PE) 03/2008   treated with coumadin for 6 months   History of sepsis 04/20/2018   per discharge note , probable UTI   Hypertension    IBS (irritable bowel syndrome)    Nocturia    OSA on CPAP    uses CPAP nightly   Pancreatic lesion    0.4cm cystic per CT 07/ 2019   Peripheral vascular disease (HCC)    Pneumonia    x 1   Pulmonary nodule    Solid 31mm Right Lower Lobe   Right renal mass 04/10/2018   new dx--  scheduled for nephrectomy 05-16-2018   S/P ureteral stent placement: 04/16/2018 04/19/2018    Medications: Warfarin PTA -Home dose: 1.5 mg PO daily on Mon, Fri; 3 mg all other days -Last dose PTA: 05/30/22 @ 1730  Assessment: Pt is a 32 yoM with PMH significant for RCC, afib (on warfarin), ESRD on HD TTS, T2DM. Pt admitted with SOB - concern for pericarditis, PNA. Pharmacy consulted to dose  warfarin.   INR today is therapeutic at 2.4. Hgb is slightly low but appears stable; platelets are WNL but noted downtrend today. No PO intake charted but patient has diet ordered. No major new DDI - Amiodarone and allopurinol are home medications continued on admission. Antibiotics (azithromycin, ceftriaxone) may enhance effects of warfarin.   Goal of Therapy:  INR 2-3 Monitor platelets by anticoagulation protocol: Yes   Plan:  Warfarin 3mg  PO x1 tonight INR, CBC with AM labs tomorrow  Dimple Nanas, PharmD, BCPS 06/01/2022 7:07 AM

## 2022-06-01 NOTE — Consult Note (Signed)
Renal Service Consult Note [name]  George Lewis. 06/01/2022 Sol Blazing, MD Requesting Physician: Dr. Posey Pronto, George Lewis.   Reason for Consult: ESRD pt w/ chest pain HPI: The patient is a 66 y.o. year-old w/ hx of anemia, atrial fib, esrd on HD, h/o PE, HTN, OSA, lung cancer who presented to ED 10/3 w/ c/o SOB, recently started a new cancer medication. Also cold symptoms. Getting worse x 1 week. Mostly +doe. Pt is TTS HD pt, has not missed HD. Feeling weak , too. Also some dull chest discomfort. In ED CXR showed mild R basilar opacity, low lung volumes c/w atx. CTA chest showed no PE, small R effusion and possible pna, stable L effusion and adjacent opacity/ atx. EKG w/ afib, no ST elevation. Pt admitted, cardiology consulted. We are asked to see for dialysis.   Pt seen on HD. Per the mother pt has stage IV lung cancer, was on keytruda and had XRT to LUL and RLL nodules. She states then some others popped up. Started a new medication recently     Pt is on HD at [facility] TTS. Good compliance. Pt reports CP as above, +cough , some phlegm, no fevers.   ROS - denies CP, no joint pain, no HA, no blurry vision, no rash, no diarrhea, no nausea/ vomiting   Past Medical History  Past Medical History:  Diagnosis Date   Anemia    Arthritis    knees   Atrial fibrillation (HCC)    Bilateral renal cysts 03/23/2018   Noted MRI ABD   Cancer ([place])    right kidney cancer   Cancer ([place])    pancreatic   Cholelithiasis 03/19/2018   noted on CT AB/Pelvis   CKD (chronic kidney disease), stage IV [facility])    nephrologist-- dr Hollie Salk [name]);  05-01-2018 has not started dialysis, scheduled for AV fistula creation 05-07-2018   Diabetes mellitus without complication ([place])    Diverticulosis of colon 04/19/2018   Noted on CT abd/pelvis   Dysrhythmia    afib   GERD (gastroesophageal reflux disease)    Hepatic steatosis 03/19/2018   Mild diffuse, noted on CT AB/Pelvis    History of kidney stones    History of pulmonary embolus (PE) 03/2008   treated with coumadin for 6 months   History of sepsis 04/20/2018   per discharge note , probable UTI   Hypertension    IBS (irritable bowel syndrome)    Nocturia    OSA on CPAP    uses CPAP nightly   Pancreatic lesion    0.4cm cystic per CT 07/ 2019   Peripheral vascular disease (HCC)    Pneumonia    x 1   Pulmonary nodule    Solid 36m Right Lower Lobe   Right renal mass 04/10/2018   new dx--  scheduled for nephrectomy 05-16-2018   S/P ureteral stent placement: 04/16/2018 04/19/2018   Past Surgical History  Past Surgical History:  Procedure Laterality Date   AV FISTULA PLACEMENT Left 05/07/2018   Procedure: Creation of Left arm Radiocephalic Fistula;  Surgeon: CMarty Heck MD;  [place]  Service: Vascular;  Laterality: Left;   AV FISTULA PLACEMENT Left 05/15/2019   Procedure: Creation of Brachiocephalic fistula left arm;  Surgeon: CWaynetta Sandy MD;  [place]  Service: Vascular;  Laterality: Left;   [place]   Procedure: BASILIC VEIN TRANSPOSITION SECOND STAGE;  Surgeon: CWaynetta Sandy MD;  [facility];  Service: Vascular;  Laterality: Left;   BRONCHIAL BRUSHINGS  08/25/2020   Procedure: BRONCHIAL BRUSHINGS;  Surgeon: Garner Nash, DO;  Location: [place] [facility];  Service: Pulmonary;;   BRONCHIAL NEEDLE ASPIRATION BIOPSY  08/25/2020   Procedure: BRONCHIAL NEEDLE ASPIRATION BIOPSIES;  Surgeon: Garner Nash, DO;  Location: [place] [facility];  Service: Pulmonary;;   BRONCHIAL WASHINGS  08/25/2020   Procedure: BRONCHIAL WASHINGS;  Surgeon: Garner Nash, DO;  Location: [facility];  Service: Pulmonary;;   COLONOSCOPY     CYSTOSCOPY/RETROGRADE/URETEROSCOPY/STONE EXTRACTION WITH BASKET  x2 last one 1990s approx.   CYSTOSCOPY/URETEROSCOPY/HOLMIUM LASER/STENT PLACEMENT Left 04/16/2018   Procedure: CYSTOSCOPY/LEFT  URETEROSCOPY/LEFT RETROGRADE/STENT PLACEMENT;  Surgeon: Lucas Mallow, MD;  Location: [facility];  Service: Urology;  Laterality: Left;   EUS N/A 05/03/2018   Procedure: UPPER ENDOSCOPIC ULTRASOUND (EUS) RADIAL;  Surgeon: Milus Banister, MD;  Location: [facility];  Service: Gastroenterology;  Laterality: N/A;   EUS N/A 05/03/2018   Procedure: UPPER ENDOSCOPIC ULTRASOUND (EUS) LINEAR;  Surgeon: Milus Banister, MD;  Location: [facility];  Service: Gastroenterology;  Laterality: N/A;   EUS  10/05/2020   upper GI   EXTRACORPOREAL SHOCK WAVE LITHOTRIPSY  x3  last one 2004 approx.   FINE NEEDLE ASPIRATION N/A 05/03/2018   Procedure: FINE NEEDLE ASPIRATION (FNA) LINEAR;  Surgeon: Milus Banister, MD;  [facility];  Service: Gastroenterology;  Laterality: N/A;   INTERCOSTAL NERVE BLOCK Left 10/16/2020   Procedure: INTERCOSTAL NERVE BLOCK;  Surgeon: Melrose Nakayama, MD;  Location: [place];  Service: Thoracic;  Laterality: Left;   LAPAROSCOPIC NEPHRECTOMY, HAND ASSISTED Right 05/16/2018   Procedure: HAND ASSISTED LAPAROSCOPIC RIGHT NEPHRECTOMY;  Surgeon: Lucas Mallow, MD;  Location: [facility];  Service: Urology;  Laterality: Right;   left index finger attachment  1980s   3-4 surgeries   NODE DISSECTION Left 10/16/2020   Procedure: NODE DISSECTION;  Surgeon: Melrose Nakayama, MD;  Location: [place];  Service: Thoracic;  Laterality: Left;   TOE SURGERY  1980s   in beween 2nd and 3rd toes cyst removed   UPPER GI ENDOSCOPY     VIDEO BRONCHOSCOPY WITH ENDOBRONCHIAL NAVIGATION Bilateral 08/25/2020   Procedure: VIDEO BRONCHOSCOPY WITH ENDOBRONCHIAL NAVIGATION;  Surgeon: Garner Nash, DO;  Location: [place];  Service: Pulmonary;  Laterality: Bilateral;   Family History  Family History  Problem Relation Age of Onset   Alzheimer's disease Mother    Alzheimer's disease Father    Heart disease Father    Heart attack Father    Cancer - Other Sister    Social History  reports  that he has never smoked. He has never used smokeless tobacco. He reports that he does not drink alcohol and does not use drugs. Allergies  Allergies  Allergen Reactions   Penicillins Rash and Other (See Comments)    Has patient had a PCN reaction causing immediate rash, facial/tongue/throat swelling, SOB or lightheadedness with hypotension: yes Has patient had a PCN reaction causing severe rash involving mucus membranes or skin necrosis: no Has patient had a PCN reaction that required hospitalization: no Has patient had a PCN reaction occurring within the last 10 years: no If all of the above answers are "NO", then may proceed with Cephalosporin use.    Home medications Prior to Admission medications   Medication Sig Start Date End Date Taking? Authorizing Provider  allopurinol (ZYLOPRIM) 100 MG tablet Take 1 tablet (100 mg total) by mouth every evening. 04/08/21  Yes Abonza, Maritza, PA-C  amiodarone (PACERONE) 200 MG tablet Take 1 tablet (200 mg total) by mouth every evening. 05/09/22  Yes Elouise Munroe, MD  atorvastatin (LIPITOR) 40 MG tablet Take 1 tablet (40 mg total) by mouth every evening. 10/19/20  Yes Gold, Wayne E, PA-C  axitinib (INLYTA) 5 MG tablet Take 1 tablet (5 mg total) by mouth 2 (two) times daily. 04/22/22  Yes Wyatt Portela, MD  diphenhydramine-acetaminophen (TYLENOL PM) 25-500 MG TABS tablet Take 3 tablets by mouth at bedtime as needed (sleep/headache).   Yes [provider]  ferric citrate (AURYXIA) 1 GM 210 MG(Fe) tablet Take 1 tablet (210 mg total) by mouth daily with supper. Patient taking differently: Take 210 mg by mouth 3 (three) times daily with meals. 10/19/20  Yes Gold, Wayne E, PA-C  fluticasone (FLONASE) 50 MCG/ACT nasal spray Place 1 spray into both nostrils daily as needed for allergies or rhinitis.   Yes [provider]  loratadine (CLARITIN) 10 MG tablet Take 10 mg by mouth every evening.    Yes [provider]  Menthol,  Topical Analgesic, (BIOFREEZE EX) Apply 1 Application topically as needed (pain).   Yes [provider]  nitroGLYCERIN (NITROSTAT) 0.4 MG SL tablet Place 1 tablet (0.4 mg total) under the tongue every 5 (five) minutes as needed for chest pain. 03/07/19  Yes Swayze, Ava, DO  omeprazole (PRILOSEC OTC) 20 MG tablet Take 40 mg by mouth daily before breakfast.    Yes [provider]  pembrolizumab (KEYTRUDA) 100 MG/4ML SOLN Inject into the vein every 6 (six) weeks. Infused at providers office 01/07/21  Yes [provider]  prochlorperazine (COMPAZINE) 10 MG tablet Take 1 tablet (10 mg total) by mouth every 6 (six) hours as needed for nausea or vomiting. Patient taking differently: Take 10 mg by mouth as needed for nausea or vomiting. 03/09/22  Yes Shadad, Mathis Dad, MD  Sennosides (SENOKOT EXTRA STRENGTH) 17.2 MG TABS Take 51.6 mg by mouth at bedtime as needed (constipation).   Yes [provider]  traMADol (ULTRAM) 50 MG tablet TAKE 1 TABLET BY MOUTH EVERY 6 HOURS AS NEEDED FOR PAIN Patient taking differently: Take 50 mg by mouth as needed for moderate pain. 02/10/22  Yes Wyatt Portela, MD  warfarin (COUMADIN) 3 MG tablet TAKE 1 TO 2 TABLETS BY MOUTH DAILY AS DIRECTED BY  THE  COUMADIN  CLINIC Patient taking differently: Take 1.5-3 mg by mouth See admin instructions. Take 3 mg by mouth in the evening on Sun/Tues/Wed/Thurs/Sat and 1.5 mg on Mon/Fri 08/12/21  Yes Elouise Munroe, MD  blood glucose meter kit and supplies Dispense based on patient and insurance preference. Use up to four times daily as directed. (FOR ICD-10 E10.9, E11.9). 12/22/18   Danford, Suann Larry, MD  gabapentin (NEURONTIN) 100 MG capsule Take 1 capsule (100 mg total) by mouth 2 (two) times daily. Take 1 PO at night for 5 days then go to twice daily Patient not taking: Reported on 05/31/2022 11/04/20   Melrose Nakayama, MD  glucose blood (ACCU-CHEK GUIDE) test strip USE 1 STRIP TO CHECK FASTING BLOOD  SUGAR AND 2 HOURS AFTER LARGEST MEAL 08/02/21   Abonza, Maritza, PA-C  insulin glargine (LANTUS) 100 UNIT/ML injection Inject into the skin daily as needed (low blood glucose 180). Unknow dose to give if needed Patient not taking: Reported on 05/13/2022    [provider]  lidocaine-prilocaine (EMLA) cream Apply topically. Patient not taking: Reported on 05/31/2022 01/11/21  [provider]  midodrine (PROAMATINE) 10 MG tablet Take 1 tablet (10 mg total) by mouth 3 (three) times daily as needed (for hypotension/dialysis). AS ordered. Take In the evening 10 mg on Mon. Wed and Friday Take 20 mg on Dialysis days Tues, Thurs and Saturday. 10 mg when he gets there and 10 mg half way through. Patient not taking: Reported on 05/31/2022 12/03/21   Cherlynn Kaiser A, MD     Vitals:   06/01/22 1100 06/01/22 1130 06/01/22 1200 06/01/22 1222  BP: (!) 151/84 (!) 137/59 (!) 154/65 (!) 141/78  Pulse: 86 92 95 95  Resp: 20 (!) 27 (!) 31 (!) 32  Temp:    98.1 F (36.7 C)  TempSrc:    Oral  SpO2: 100% 100% 100% 100%  Weight:    130.1 kg  Height:       Exam Gen alert, no distress No rash, cyanosis or gangrene Sclera anicteric, throat clear  No jvd or bruits Chest clear bilat to bases, no rales/ wheezing RRR no MRG Abd soft ntnd no mass or ascites +bs GU normal male MS no joint effusions or deformity Ext trace pretib edema, no wounds or ulcers Neuro is alert, Ox 3 , nf    LUA AVF+bruit   Home meds include - allopurinol, amiodarone, arovastatin, axitinib, omeprazole, pembrolizumab, tramadol, warfarin, gabapentin, insulin glargine, midodrine 79m preHD tts, prns/ vits/ supps   OP HD: TTS [place] 4h 450/ 600   130.9kg  3K/2.5bath  P2  Hep none  LUA AVF - last HD 9/30, post wt 131.2kg - rocaltrol 0.5 mcg tiw po - last Hb 11.4, no esa    CXR - poor film, IMPRESSION: 1. Mild right base opacity with low lung volumes favoring atelectasis. 2. Chronic cardiomegaly.    BP 155/ 75, HR 80-90,  RR 17- 25, afeb, 4L [place]     CTA chest - no PE, mild bilat effusions w/ mild adjacent bilat atx vs consolidation  Assessment/ Plan: Chest pain - trops negative, EKG unremarkable. CTA no PE. Possibly due to vol overload. Considering pericarditis per pmd. I have lowered colchicine to 0.655mq 72hrs which is esrd dosing. Possible cardiology consult.  SOB - possibly some vol overload per CXR/ vasc congestion. Will pull max UF w/ HD today, see if symptoms improve.  ESRD - on HD TTS. HD today off schedule for Tuesday. Next HD tomorrow.  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  MBD ckd - CCa in range, cont vdra po w/ HD DM2 - on insulin, per pmd       Rob Geanie Pacifico  MD 06/01/2022, 1:20 PM Recent Labs  Lab 05/31/22 0606 05/31/22 0831 06/01/22 0109  HGB  --  11.4* 10.5*  ALBUMIN  --   --  3.2*  CALCIUM 8.0*  --  7.8*  CREATININE 9.27*  --  10.36*  K 4.4  --  4.6   Inpatient medications:  allopurinol  100 mg Oral QPM   amiodarone  200 mg Oral QPM   atorvastatin  40 mg Oral QPM   azithromycin  500 mg Oral Daily   Chlorhexidine Gluconate Cloth  6 each Topical Q0600   colchicine  0.6 mg Oral Daily   loratadine  10 mg Oral QPM   warfarin  3 mg Oral ONCE-1600   Warfarin - Pharmacist Dosing Inpatient   Does not apply q1600    cefTRIAXone (ROCEPHIN)  IV  acetaminophen **OR** acetaminophen, albuterol, fluticasone, guaiFENesin, prochlorperazine, traMADol

## 2022-06-01 NOTE — Progress Notes (Signed)
Patient placed self on CPAP.  Patient resting well.  RT will continue to monitor.

## 2022-06-01 NOTE — Progress Notes (Signed)
Progress Note Patient: George Lewis. NLZ:[id number] DOB: March 03, 1956 DOA: 05/31/2022  DOS: the patient was seen and examined on 06/01/2022  Brief hospital course: George Lewis. is a 66 y.o. male with medical history significant of RCC, A fib on coumadin, HLD, ESRD on HD TTS, DM2.  Presented to the hospital with complaints of progressive worsening shortness of breath ongoing for last 7 days after starting to take new medication for his RCC INLYTA. Cardiology and nephrology consulted.  Assessment and Plan: Acute on chronic diastolic CHF. Paroxysmal A-fib with RVR. Small right pleural effusion with atelectasis. Possible medication side effect. Patient presents with complaints of shortness of breath.  This started immediately after him starting INLYTA. Symptoms progressively worsen without any swelling of the legs or weight gain to the point that he was not able to go to hemodialysis at which point he was brought to the ER. Treated with HD with improvement in symptoms. Echocardiogram shows preserved EF.  No pericardial effusion.  No significant valvular or wall motion abnormalities as well. On exam has faint basilar crackles more on the right side. For now continue volume removal with HD. Patient appears to have intermittent RVR with heart rate ranging from 105 to 130 at the time of my evaluation. On amiodarone.  For now continue.  That can be amiodarone toxicity which can also cause shortness of breath.  Continue anticoagulation. Management per cardiology.  Chest pain. There was some concern for pleuritic chest pain which might be due to pericarditis. Currently based on the exam findings as well as history I do not think the patient has pericarditis and therefore we will discontinue this medication.  Community-acquired pneumonia versus Keytruda induced pneumonitis. For now we will continue with IV antibiotics and follow-up on cultures. Check Pro-Cal tomorrow. We will discuss  with oncology to see if they suspect Keytruda induced pneumonitis and initiate prednisone.  Type 2 diabetes mellitus, diet controlled. Check hemoglobin A1c. Continue current modified diet.  RCC. Follows up with Dr. Alen Lewis. Monitor.  HLD. Continue statin.  ESRD on HD on TTS schedule. Nephrology consult for Appreciate assistance. Transfer from [place] to [facility] for HD needs. Underwent urgent HD on Wednesday on 10/4.  Subjective: Continues to have shortness of breath.  Worsening with lying down.  No nausea or vomiting.  No fever no chills.  Reports chest pain which appears to be sore musclelike feeling which worsens with taking a deep breath or lying down.  Physical Exam: Vitals:   06/01/22 1200 06/01/22 1222 06/01/22 1300 06/01/22 1951  BP: (!) 154/65 (!) 141/78 (!) 145/77 136/72  Pulse: 95 95 98 93  Resp: (!) 31 (!) 32 (!) 32 (!) 22  Temp:  98.1 F (36.7 C) (!) 97.1 F (36.2 C) 98.2 F (36.8 C)  TempSrc:  Oral Oral Oral  SpO2: 100% 100% 100% 98%  Weight:  130.1 kg    Height:       General: Appear in moderate distress; no visible Abnormal Neck Mass Or lumps, Conjunctiva normal Cardiovascular: S1 and S2 Present, no Murmur, Respiratory: increased respiratory effort, Bilateral Air entry present and bilateral Crackles, no wheezes Abdomen: Bowel Sound present, Non tender  Extremities: no Pedal edema Neurology: alert and oriented to time, place, and person  Gait not checked due to patient safety concerns   Data Reviewed: I have Reviewed nursing notes, Vitals, and Lab results since pt's last encounter. Pertinent lab results CBC and BMP I have ordered test including CBC and BMP  Family Communication: Wife at bedside  Disposition: Status is: Inpatient Remains inpatient appropriate because: Need for further work-up and ongoing therapy for volume overload with HD  Author: Berle Mull, MD 06/01/2022 8:07 PM  Please look on [email or web address] to find out who is on call.

## 2022-06-01 NOTE — Progress Notes (Signed)
Patient in Dialysis transported on the monitor with 2 L of oxygen N/c for SOB  consent started pending MD attestation for treatment.

## 2022-06-01 NOTE — Consult Note (Signed)
Cardiology Consultation   [name]. MRN: [id number]; DOB: 07/25/1956  Admit date: 05/31/2022 Date of Consult: 06/01/2022  PCP:  Lorrene Reid, [place] [facility] Cardiologist:  Elouise Munroe, MD        Patient Profile:   George Lewis. is a 66 y.o. 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 Marylyn Ishihara.  History of Present Illness:   George Lewis is a 66 year old male with above-noted medical history who presented to the emergency department on 10/3 for evaluation of shortness of breath and progressive weakness.  The symptoms originally began about 7-10 days ago with shortness of breath and nonproductive cough.  Patient reported worsening dyspnea with supine positioning and states that his breathing was so bad on Tuesday, he couldn't make it to dialysis prompting ED visit. Patient otherwise denies recent missed dialysis. He has needed to sleep in an elevated position in the last week in order to not feel short of breath. Patient also reports onset of aching chest pain that seems to come on after onset of shortness of breath, improves when breathing improves (typically after sitting up). He denies subjective fever, arthralgia.  Patient states that around 9/22 he was started on new immunotherapy, appears to be Inlyta (Axitinib).  In the ED patient underwent chest x-ray with findings of possible atelectasis versus pneumonia.  CT a chest performed with no evidence of pulmonary embolism.  New right pleural effusion (small) with adjacent consolidation.  Unchanged left pleural effusion.  Unchanged left upper lobe pulmonary nodule.  Unchanged 2.0 cm mediastinal lymph node.  Given concern for pneumonia, patient was given IV Rocephin and  azithromycin for CAP coverage.  COVID-19 and influenza PCR testing was negative.  Troponin found normal at 9, 11.  BNP elevated to 462.7.   Past Medical History:  Diagnosis Date   Anemia    Arthritis    knees   Atrial fibrillation ([place])    Bilateral renal cysts 03/23/2018   Noted MRI ABD   Cancer ([place])    right kidney cancer   Cancer ([place])    pancreatic   Cholelithiasis 03/19/2018   noted on CT AB/Pelvis   Diabetes mellitus without complication ([place])    Diverticulosis of colon 04/19/2018   Noted on CT abd/pelvis   Dysrhythmia    afib   ESRD on hemodialysis [facility])    nephrologist-- dr Hollie Salk [name]);  05-01-2018 has not started dialysis, scheduled for AV fistula creation 05-07-2018   GERD (gastroesophageal reflux disease)    Hepatic steatosis 03/19/2018   Mild diffuse, noted on CT AB/Pelvis   History of kidney stones    History of pulmonary embolus (PE) 03/2008   treated with coumadin for 6 months   History of sepsis 04/20/2018   per discharge note , probable UTI   Hypertension    IBS (irritable bowel syndrome)    Nocturia    OSA on CPAP    uses CPAP nightly   Pancreatic lesion    0.4cm cystic per CT 07/ 2019   Peripheral vascular disease (HCC)    Pneumonia    x 1   Pulmonary nodule    Solid 26m Right Lower Lobe   Right renal mass 04/10/2018   new dx--  scheduled for nephrectomy  05-16-2018   S/P ureteral stent placement: 04/16/2018 04/19/2018    Past Surgical History:  Procedure Laterality Date   AV FISTULA PLACEMENT Left 05/07/2018   Procedure: Creation of Left arm Radiocephalic Fistula;  Surgeon: Marty Heck, MD;  Location: [place];  Service: Vascular;  Laterality: Left;   AV FISTULA PLACEMENT Left 05/15/2019   Procedure: Creation of Brachiocephalic fistula left arm;  Surgeon: Waynetta Sandy, MD;  Location: [place];  Service: Vascular;  Laterality: Left;   [place] Left 07/15/2019   Procedure: BASILIC VEIN TRANSPOSITION  SECOND STAGE;  Surgeon: Waynetta Sandy, MD;  Location: [place];  Service: Vascular;  Laterality: Left;   BRONCHIAL BRUSHINGS  08/25/2020   Procedure: BRONCHIAL BRUSHINGS;  Surgeon: Garner Nash, DO;  Location: [place] [facility];  Service: Pulmonary;;   BRONCHIAL NEEDLE ASPIRATION BIOPSY  08/25/2020   Procedure: BRONCHIAL NEEDLE ASPIRATION BIOPSIES;  Surgeon: Garner Nash, DO;  Location: [place] [facility];  Service: Pulmonary;;   BRONCHIAL WASHINGS  08/25/2020   Procedure: BRONCHIAL WASHINGS;  Surgeon: Garner Nash, DO;  Location: [place];  Service: Pulmonary;;   COLONOSCOPY     CYSTOSCOPY/RETROGRADE/URETEROSCOPY/STONE EXTRACTION WITH BASKET  x2 last one 1990s approx.   CYSTOSCOPY/URETEROSCOPY/HOLMIUM LASER/STENT PLACEMENT Left 04/16/2018   Procedure: CYSTOSCOPY/LEFT URETEROSCOPY/LEFT RETROGRADE/STENT PLACEMENT;  Surgeon: Lucas Mallow, MD;  Location[facility];  Service: Urology;  Laterality: Left;   EUS N/A 05/03/2018   Procedure: UPPER ENDOSCOPIC ULTRASOUND (EUS) RADIAL;  Surgeon: Milus Banister, MD;  [facility];  Service: Gastroenterology;  Laterality: N/A;   EUS N/A 05/03/2018   Procedure: UPPER ENDOSCOPIC ULTRASOUND (EUS) LINEAR;  Surgeon: Milus Banister, MD;  [facility];  Service: Gastroenterology;  Laterality: N/A;   EUS  10/05/2020   upper GI   EXTRACORPOREAL SHOCK WAVE LITHOTRIPSY  x3  last one 2004 approx.   FINE NEEDLE ASPIRATION N/A 05/03/2018   Procedure: FINE NEEDLE ASPIRATION (FNA) LINEAR;  Surgeon: Milus Banister, MD;  [facility];  Service: Gastroenterology;  Laterality: N/A;   INTERCOSTAL NERVE BLOCK Left 10/16/2020   Procedure: INTERCOSTAL NERVE BLOCK;  Surgeon: Melrose Nakayama, MD;  Location: [place];  Service: Thoracic;  Laterality: Left;   LAPAROSCOPIC NEPHRECTOMY, HAND ASSISTED Right 05/16/2018   Procedure: HAND ASSISTED LAPAROSCOPIC RIGHT NEPHRECTOMY;  Surgeon: Lucas Mallow, MD;  Location: [facility];  Service:  Urology;  Laterality: Right;   left index finger attachment  1980s   3-4 surgeries   NODE DISSECTION Left 10/16/2020   Procedure: NODE DISSECTION;  Surgeon: Melrose Nakayama, MD;  Location: [place];  Service: Thoracic;  Laterality: Left;   TOE SURGERY  1980s   in beween 2nd and 3rd toes cyst removed   UPPER GI ENDOSCOPY     VIDEO BRONCHOSCOPY WITH ENDOBRONCHIAL NAVIGATION Bilateral 08/25/2020   Procedure: VIDEO BRONCHOSCOPY WITH ENDOBRONCHIAL NAVIGATION;  Surgeon: Garner Nash, DO;  Location: [place];  Service: Pulmonary;  Laterality: Bilateral;     Home Medications:  Prior to Admission medications   Medication Sig Start Date End Date Taking? Authorizing Provider  allopurinol (ZYLOPRIM) 100 MG tablet Take 1 tablet (100 mg total) by mouth every evening. 04/08/21  Yes Abonza, Herb Grays, PA-C  amiodarone (PACERONE) 200 MG tablet Take 1 tablet (200 mg total) by mouth every evening. 05/09/22  Yes Elouise Munroe, MD  atorvastatin (LIPITOR) 40 MG tablet Take 1 tablet (40 mg total) by mouth every evening. 10/19/20  Yes Gold, Wayne E, PA-C  axitinib (  INLYTA) 5 MG tablet Take 1 tablet (5 mg total) by mouth 2 (two) times daily. 04/22/22  Yes Wyatt Portela, MD  diphenhydramine-acetaminophen (TYLENOL PM) 25-500 MG TABS tablet Take 3 tablets by mouth at bedtime as needed (sleep/headache).   Yes [provider]  ferric citrate (AURYXIA) 1 GM 210 MG(Fe) tablet Take 1 tablet (210 mg total) by mouth daily with supper. Patient taking differently: Take 210 mg by mouth 3 (three) times daily with meals. 10/19/20  Yes Gold, Wayne E, PA-C  fluticasone (FLONASE) 50 MCG/ACT nasal spray Place 1 spray into both nostrils daily as needed for allergies or rhinitis.   Yes [provider]  loratadine (CLARITIN) 10 MG tablet Take 10 mg by mouth every evening.    Yes [provider]  Menthol, Topical Analgesic, (BIOFREEZE EX) Apply 1 Application topically as needed (pain).   Yes [provider]  nitroGLYCERIN (NITROSTAT) 0.4 MG SL tablet Place 1 tablet (0.4 mg total) under the tongue every 5 (five) minutes as needed for chest pain. 03/07/19  Yes Swayze, Ava, DO  omeprazole (PRILOSEC OTC) 20 MG tablet Take 40 mg by mouth daily before breakfast.    Yes [provider]  pembrolizumab (KEYTRUDA) 100 MG/4ML SOLN Inject into the vein every 6 (six) weeks. Infused at providers office 01/07/21  Yes [provider]  prochlorperazine (COMPAZINE) 10 MG tablet Take 1 tablet (10 mg total) by mouth every 6 (six) hours as needed for nausea or vomiting. Patient taking differently: Take 10 mg by mouth as needed for nausea or vomiting. 03/09/22  Yes Shadad, Mathis Dad, MD  Sennosides (SENOKOT EXTRA STRENGTH) 17.2 MG TABS Take 51.6 mg by mouth at bedtime as needed (constipation).   Yes [provider]  traMADol (ULTRAM) 50 MG tablet TAKE 1 TABLET BY MOUTH EVERY 6 HOURS AS NEEDED FOR PAIN Patient taking differently: Take 50 mg by mouth as needed for moderate pain. 02/10/22  Yes Wyatt Portela, MD  warfarin (COUMADIN) 3 MG tablet TAKE 1 TO 2 TABLETS BY MOUTH DAILY AS DIRECTED BY  THE  COUMADIN  CLINIC Patient taking differently: Take 1.5-3 mg by mouth See admin instructions. Take 3 mg by mouth in the evening on Sun/Tues/Wed/Thurs/Sat and 1.5 mg on Mon/Fri 08/12/21  Yes Elouise Munroe, MD  blood glucose meter kit and supplies Dispense based on patient and insurance preference. Use up to four times daily as directed. (FOR ICD-10 E10.9, E11.9). 12/22/18   Danford, Suann Larry, MD  gabapentin (NEURONTIN) 100 MG capsule Take 1 capsule (100 mg total) by mouth 2 (two) times daily. Take 1 PO at night for 5 days then go to twice daily Patient not taking: Reported on 05/31/2022 11/04/20   Melrose Nakayama, MD  glucose blood (ACCU-CHEK GUIDE) test strip USE 1 STRIP TO CHECK FASTING BLOOD SUGAR AND 2 HOURS AFTER LARGEST MEAL 08/02/21   Abonza, Maritza, PA-C  insulin glargine (LANTUS)  100 UNIT/ML injection Inject into the skin daily as needed (low blood glucose 180). Unknow dose to give if needed Patient not taking: Reported on 05/13/2022    [provider]  lidocaine-prilocaine (EMLA) cream Apply topically. Patient not taking: Reported on 05/31/2022 01/11/21   [provider]  midodrine (PROAMATINE) 10 MG tablet Take 1 tablet (10 mg total) by mouth 3 (three) times daily as needed (for hypotension/dialysis). AS ordered. Take In the evening 10 mg on Mon. Wed and Friday Take 20 mg on Dialysis days Tues, Thurs and Saturday. 10 mg  when he gets there and 10 mg half way through. Patient not taking: Reported on 05/31/2022 12/03/21   Elouise Munroe, MD    Inpatient Medications: Scheduled Meds:  allopurinol  100 mg Oral QPM   amiodarone  200 mg Oral QPM   atorvastatin  40 mg Oral QPM   azithromycin  500 mg Oral Daily   Chlorhexidine Gluconate Cloth  6 each Topical Q0600   colchicine  0.6 mg Oral Daily   loratadine  10 mg Oral QPM   warfarin  3 mg Oral ONCE-1600   Warfarin - Pharmacist Dosing Inpatient   Does not apply q1600   Continuous Infusions:  cefTRIAXone (ROCEPHIN)  IV     PRN Meds: acetaminophen **OR** acetaminophen, albuterol, fluticasone, guaiFENesin, prochlorperazine, traMADol  Allergies:    Allergies  Allergen Reactions   Penicillins Rash and Other (See Comments)    Has patient had a PCN reaction causing immediate rash, facial/tongue/throat swelling, SOB or lightheadedness with hypotension: yes Has patient had a PCN reaction causing severe rash involving mucus membranes or skin necrosis: no Has patient had a PCN reaction that required hospitalization: no Has patient had a PCN reaction occurring within the last 10 years: no If all of the above answers are "NO", then may proceed with Cephalosporin use.     Social History:   Social History   Socioeconomic History   Marital status: Married    Spouse name: Not on file   Number of children:  Not on file   Years of education: Not on file   Highest education level: Not on file  Occupational History   Occupation: disabled  Tobacco Use   Smoking status: Never   Smokeless tobacco: Never  Vaping Use   Vaping Use: Never used  Substance and Sexual Activity   Alcohol use: Never   Drug use: Never   Sexual activity: Not on file  Other Topics Concern   Not on file  Social History Narrative   Not on file   Social Determinants of Health   Financial Resource Strain: Unknown (04/19/2018)   Overall Financial Resource Strain (CARDIA)    Difficulty of Paying Living Expenses: Patient refused  Food Insecurity: No Food Insecurity (04/19/2018)   Hunger Vital Sign    Worried About Running Out of Food in the Last Year: Never true    Ran Out of Food in the Last Year: Never true  Transportation Needs: No Transportation Needs (04/19/2018)   PRAPARE - Hydrologist (Medical): No    Lack of Transportation (Non-Medical): No  Physical Activity: Inactive (04/19/2018)   Exercise Vital Sign    Days of Exercise per Week: 0 days    Minutes of Exercise per Session: 0 min  Stress: Not on file  Social Connections: Moderately Integrated (04/19/2018)   Social Connection and Isolation Panel [NHANES]    Frequency of Communication with Friends and Family: More than three times a week    Frequency of Social Gatherings with Friends and Family: Never    Attends Religious Services: More than 4 times per year    Active Member of [employer] or Organizations: No    Attends Archivist Meetings: Never    Marital Status: Married  Human resources officer Violence: Not on file    Family History:    Family History  Problem Relation Age of Onset   Alzheimer's disease Mother    Alzheimer's disease Father    Heart disease Father    Heart attack Father  Cancer - Other Sister      ROS:  Please see the history of present illness.   All other ROS reviewed and negative.     Physical  Exam/Data:   Vitals:   06/01/22 1100 06/01/22 1130 06/01/22 1200 06/01/22 1222  BP: (!) 151/84 (!) 137/59 (!) 154/65 (!) 141/78  Pulse: 86 92 95 95  Resp: 20 (!) 27 (!) 31 (!) 32  Temp:    98.1 F (36.7 C)  TempSrc:    Oral  SpO2: 100% 100% 100% 100%  Weight:    130.1 kg  Height:        Intake/Output Summary (Last 24 hours) at 06/01/2022 1342 Last data filed at 06/01/2022 1316 Gross per 24 hour  Intake 490.94 ml  Output 3 ml  Net 487.94 ml      06/01/2022   12:22 PM 06/01/2022    7:45 AM 06/01/2022    4:49 AM  Last 3 Weights  Weight (lbs) 286 lb 13.1 oz 287 lb 0.6 oz 287 lb  Weight (kg) 130.1 kg 130.2 kg 130.182 kg     Body mass index is 37.84 kg/m.  General:  Well nourished, well developed, in no acute distress HEENT: normal Neck: unable to appreciate secondary to body habitus Vascular: No carotid bruits; Distal pulses 2+ bilaterally Cardiac:  normal S1, S2; RRR; no murmur  Lungs:  clear to auscultation bilaterally, no wheezing, rhonchi or rales  Abd: soft, nontender, no hepatomegaly  Ext: no edema Musculoskeletal:  No deformities, BUE and BLE strength normal and equal Skin: warm and dry  Neuro:  CNs 2-12 intact, no focal abnormalities noted Psych:  Normal affect   EKG:  The EKG was personally reviewed and demonstrates:  Sinus rhythm without acute ischemic changes. Telemetry:  Telemetry was personally reviewed and demonstrates:  sinus rhythm with intermittent tachycardia  Relevant CV Studies: 05/10/2019 Myocardial Perfusion  The left ventricular ejection fraction is mildly decreased (45-54%). Nuclear stress EF: 52%. There was no ST segment deviation noted during stress. Defect 1: There is a small defect of severe severity present in the apex location. This is a low risk study.   Abnormal, low risk stress nuclear study with significant apical thinning vs infarct; cannot exclude very mild superimposed apical lateral ischemia; EF low normal (52) with normal wall  motion.  02/26/2019 TTE  IMPRESSIONS     1. The left ventricle has normal systolic function with an ejection  fraction of 60-65%. The cavity size was normal. Left ventricular diastolic  Doppler parameters are consistent with impaired relaxation.   2. The right ventricle has normal systolic function. The cavity was  normal. There is no increase in right ventricular wall thickness.   3. Left atrial size was mildly dilated.   4. The aortic valve is tricuspid. Mild thickening of the aortic valve.  Mild calcification of the aortic valve.   FINDINGS   Left Ventricle: The left ventricle has normal systolic function, with an  ejection fraction of 60-65%. The cavity size was normal. There is no  increase in left ventricular wall thickness. Left ventricular diastolic  Doppler parameters are consistent with  impaired relaxation.   Right Ventricle: The right ventricle has normal systolic function. The  cavity was normal. There is no increase in right ventricular wall  thickness.   Left Atrium: Left atrial size was mildly dilated.   Right Atrium: Right atrial size was normal in size. Right atrial pressure  is estimated at 10 mmHg.   Interatrial Septum:  No atrial level shunt detected by color flow Doppler.   Pericardium: There is no evidence of pericardial effusion.   Mitral Valve: The mitral valve is normal in structure. Mitral valve  regurgitation is mild by color flow Doppler.   Tricuspid Valve: The tricuspid valve is normal in structure. Tricuspid  valve regurgitation is mild by color flow Doppler.   Aortic Valve: The aortic valve is tricuspid Mild thickening of the aortic  valve. Mild calcification of the aortic valve. Aortic valve regurgitation  was not visualized by color flow Doppler. There is no evidence of aortic  valve stenosis.   Pulmonic Valve: The pulmonic valve was normal in structure. Pulmonic valve  regurgitation is not visualized by color flow Doppler.   Venous:  The inferior vena cava is normal in size with greater than 50%  respiratory variability.   IMPRESSIONS     1. Left ventricular ejection fraction, by estimation, is 55 to 60%. The  left ventricle has normal function. The left ventricle has no regional  wall motion abnormalities. Left ventricular diastolic parameters are  consistent with Grade II diastolic  dysfunction (pseudonormalization).   2. Right ventricular systolic function is normal. The right ventricular  size is normal. Tricuspid regurgitation signal is inadequate for assessing  PA pressure.   3. Left atrial size was mildly dilated.   4. Right atrial size was mildly dilated.   5. The mitral valve is normal in structure. Mild mitral valve  regurgitation. No evidence of mitral stenosis.   6. The aortic valve is tricuspid. There is moderate calcification of the  aortic valve. Aortic valve regurgitation is not visualized. Mild to  moderate aortic valve stenosis. Aortic valve area, by VTI measures 1.44  cm. Aortic valve mean gradient measures   18.0 mmHg.   7. Aortic dilatation noted. There is mild dilatation of the ascending  aorta, measuring 41 mm.   8. IVC not well-visualized .   FINDINGS   Left Ventricle: Left ventricular ejection fraction, by estimation, is 55  to 60%. The left ventricle has normal function. The left ventricle has no  regional wall motion abnormalities. Definity contrast agent was given IV  to delineate the left ventricular   endocardial borders. The left ventricular internal cavity size was normal  in size. There is no left ventricular hypertrophy. Left ventricular  diastolic parameters are consistent with Grade II diastolic dysfunction  (pseudonormalization).   Right Ventricle: The right ventricular size is normal. No increase in  right ventricular wall thickness. Right ventricular systolic function is  normal. Tricuspid regurgitation signal is inadequate for assessing PA  pressure.   Left  Atrium: Left atrial size was mildly dilated.   Right Atrium: Right atrial size was mildly dilated.   Pericardium: There is no evidence of pericardial effusion.   Mitral Valve: The mitral valve is normal in structure. There is mild  calcification of the mitral valve leaflet(s). Mild mitral annular  calcification. Mild mitral valve regurgitation. No evidence of mitral  valve stenosis.   Tricuspid Valve: The tricuspid valve is normal in structure. Tricuspid  valve regurgitation is not demonstrated.   Aortic Valve: The aortic valve is tricuspid. There is moderate  calcification of the aortic valve. Aortic valve regurgitation is not  visualized. Mild to moderate aortic stenosis is present. Aortic valve mean  gradient measures 18.0 mmHg. Aortic valve peak  gradient measures 29.1 mmHg. Aortic valve area, by VTI measures 1.44 cm.   Pulmonic Valve: The pulmonic valve was normal in structure. Pulmonic  valve  regurgitation is not visualized.   Aorta: The aortic root is normal in size and structure and aortic  dilatation noted. There is mild dilatation of the ascending aorta,  measuring 41 mm.   Venous: The inferior vena cava was not well visualized.   IAS/Shunts: No atrial level shunt detected by color flow Doppler.   06/01/2022 TTE  IMPRESSIONS     1. Left ventricular ejection fraction, by estimation, is 55 to 60%. The  left ventricle has normal function. The left ventricle has no regional  wall motion abnormalities. Left ventricular diastolic parameters are  consistent with Grade II diastolic  dysfunction (pseudonormalization).   2. Right ventricular systolic function is normal. The right ventricular  size is normal. Tricuspid regurgitation signal is inadequate for assessing  PA pressure.   3. Left atrial size was mildly dilated.   4. Right atrial size was mildly dilated.   5. The mitral valve is normal in structure. Mild mitral valve  regurgitation. No evidence of mitral  stenosis.   6. The aortic valve is tricuspid. There is moderate calcification of the  aortic valve. Aortic valve regurgitation is not visualized. Mild to  moderate aortic valve stenosis. Aortic valve area, by VTI measures 1.44  cm. Aortic valve mean gradient measures   18.0 mmHg.   7. Aortic dilatation noted. There is mild dilatation of the ascending  aorta, measuring 41 mm.   8. IVC not well-visualized .   FINDINGS   Left Ventricle: Left ventricular ejection fraction, by estimation, is 55  to 60%. The left ventricle has normal function. The left ventricle has no  regional wall motion abnormalities. Definity contrast agent was given IV  to delineate the left ventricular   endocardial borders. The left ventricular internal cavity size was normal  in size. There is no left ventricular hypertrophy. Left ventricular  diastolic parameters are consistent with Grade II diastolic dysfunction  (pseudonormalization).   Right Ventricle: The right ventricular size is normal. No increase in  right ventricular wall thickness. Right ventricular systolic function is  normal. Tricuspid regurgitation signal is inadequate for assessing PA  pressure.   Left Atrium: Left atrial size was mildly dilated.   Right Atrium: Right atrial size was mildly dilated.   Pericardium: There is no evidence of pericardial effusion.   Mitral Valve: The mitral valve is normal in structure. There is mild  calcification of the mitral valve leaflet(s). Mild mitral annular  calcification. Mild mitral valve regurgitation. No evidence of mitral  valve stenosis.   Tricuspid Valve: The tricuspid valve is normal in structure. Tricuspid  valve regurgitation is not demonstrated.   Aortic Valve: The aortic valve is tricuspid. There is moderate  calcification of the aortic valve. Aortic valve regurgitation is not  visualized. Mild to moderate aortic stenosis is present. Aortic valve mean  gradient measures 18.0 mmHg. Aortic  valve peak  gradient measures 29.1 mmHg. Aortic valve area, by VTI measures 1.44 cm.   Pulmonic Valve: The pulmonic valve was normal in structure. Pulmonic valve  regurgitation is not visualized.   Aorta: The aortic root is normal in size and structure and aortic  dilatation noted. There is mild dilatation of the ascending aorta,  measuring 41 mm.   Venous: The inferior vena cava was not well visualized.   IAS/Shunts: No atrial level shunt detected by color flow Doppler.    Laboratory Data:  High Sensitivity Troponin:   Recent Labs  Lab 05/31/22 1245 05/31/22 1516  TROPONINIHS 9 11  Chemistry Recent Labs  Lab 05/31/22 0606 06/01/22 0109  NA 136 133*  K 4.4 4.6  CL 103 101  CO2 18* 16*  GLUCOSE 92 83  BUN 54* 57*  CREATININE 9.27* 10.36*  CALCIUM 8.0* 7.8*  GFRNONAA 6* 5*  ANIONGAP 15 16*    Recent Labs  Lab 06/01/22 0109  PROT 6.8  ALBUMIN 3.2*  AST 17  ALT 11  ALKPHOS 37*  BILITOT 0.5   Lipids No results for input(s): "CHOL", "TRIG", "HDL", "LABVLDL", "LDLCALC", "CHOLHDL" in the last 168 hours.  Hematology Recent Labs  Lab 05/31/22 0831 06/01/22 0109  WBC 9.3 9.3  RBC 3.63* 3.33*  HGB 11.4* 10.5*  HCT 33.9* 31.4*  MCV 93.4 94.3  MCH 31.4 31.5  MCHC 33.6 33.4  RDW 13.9 14.4  PLT 236 179   Thyroid No results for input(s): "TSH", "FREET4" in the last 168 hours.  BNP Recent Labs  Lab 05/31/22 0606  BNP 462.7*    DDimer No results for input(s): "DDIMER" in the last 168 hours.   Radiology/Studies:  CT Angio Chest PE W and/or Wo Contrast  Result Date: 05/31/2022 CLINICAL DATA:  Cough, dyspnea, fatigue, fever arthralgias. History of renal cell EXAM: CT ANGIOGRAPHY CHEST WITH CONTRAST TECHNIQUE: Multidetector CT imaging of the chest was performed using the standard protocol during bolus administration of intravenous contrast. Multiplanar CT image reconstructions and MIPs were obtained to evaluate the vascular anatomy. RADIATION DOSE  REDUCTION: This exam was performed according to the departmental dose-optimization program which includes automated exposure control, adjustment of the mA and/or kV according to patient size and/or use of iterative reconstruction technique. CONTRAST:  58m OMNIPAQUE IOHEXOL 350 MG/ML SOLN COMPARISON:  CT chest 04/17/2022 FINDINGS: Cardiovascular: There is adequate opacification of the pulmonary arteries to the segmental level. There is no evidence of pulmonary embolism. The heart size is stable. There is no pericardial effusion. Aortic valve and coronary artery calcifications are again seen. The thoracic aorta is normal in appearance. Mediastinum/Nodes: The thyroid is unremarkable. The esophagus is grossly unremarkable. A 2.0 cm posterior mediastinal lymph node is not significantly changed in size. There is no new or progressive mediastinal lymphadenopathy. There is no hilar or axillary lymphadenopathy. Lungs/Pleura: The trachea and central airways are patent. Lung volumes are low. A small left pleural effusion and adjacent airspace opacity are unchanged since the CT chest from 04/20/2022. Consolidation and architectural distortion in the left upper lobe along the major fissure are unchanged. There is a new small right pleural effusion with adjacent consolidation. The 0.9 cm nodule in the left upper lobe is unchanged (6-89). Additional nodules in the right lower lobe are not seen on the current study, obscured by consolidation. Upper Abdomen: The imaged portions of the upper abdominal viscera are unremarkable. Musculoskeletal: There is no acute osseous abnormality or suspicious osseous lesion. Review of the MIP images confirms the above findings. IMPRESSION: 1. No evidence of pulmonary embolism. 2. New small right pleural effusion with adjacent consolidation. Pneumonia is not excluded. 3. Unchanged left pleural effusion and adjacent airspace opacity/volume loss. Unchanged consolidation and architectural distortion  in the left upper lobe along the major fissure. 4. Unchanged left upper lobe pulmonary nodule measuring 0.9 cm. Additional right lower lobe nodules are obscured by the consolidation and not well assessed. 5. Unchanged 2.0 cm mediastinal lymph node. Electronically Signed   By: PValetta MoleM.D.   On: 05/31/2022 10:57   DG Chest 2 View  Result Date: 05/31/2022 CLINICAL DATA:  Lung cancer.  Increasing shortness of breath EXAM: CHEST - 2 VIEW COMPARISON:  11/20/2021 FINDINGS: Low volume chest. Cardiomegaly and vascular pedicle widening. There is no edema, air bronchogram, effusion, or pneumothorax. Chronic blunting at the lateral left costophrenic sulcus which is likely fatty based on comparison CT. Streaky indistinct density at the right lung base. IMPRESSION: 1. Mild right base opacity with low lung volumes favoring atelectasis. 2. Chronic cardiomegaly. Electronically Signed   By: Jorje Guild M.D.   On: 05/31/2022 06:03     Assessment and Plan:   Atypical chest pain Presumed Community Acquired Pneumonia Concern for acute CHF exacerbation  Patient with presumed CAP reporting chest pain that is somewhat positional and is strongly associated with orthopnea. ESR elevated to 76, CRP 3.0. ECG is without acute ischemic change and troponins reassuringly negative at 9 and 11.   Chest pain symptoms are atypical for ACS or pericarditis. Given significant symptoms of orthopnea, would favor CHF exacerbation, though without missed dialysis sessions, it is unclear why patient became volume overloaded, possible HF secondary to new immunotherapy Axitinib vs insufficient dialysis volume removal. Patient with improvement in dyspnea after aggressive dialysis today. TTE today with LVEF 55-60%, no regional wall motion abnormalities, grade II diastolic dysfunction. Mild bilateral atrial dilation  Afib on Amiodarone and Eliquis  Patient maintaining NSR with Amiodarone 235m once daily. Continue both Amiodarone and  Warfarin.  Hyperlipidemia  Continue Atorvastatin 457mdaily.  ESRD on dialysis  Patient s/p max UF HD today with improved dypsnea. Will continue to assess etiology behind volume overload. In the setting of ESRD and concurrent Amiodarone use, I'm reluctant to continue Colchicine given lack of clear pericarditis. Steroids might be an appropriate alternative. Defer to attending physician.  DM type II  Insulin per primary team  Risk Assessment/Risk Scores:        New York Heart Association (NYHA) Functional Class NYHA Class II  CHA2DS2-VASc Score = 2   This indicates a 2.2% annual risk of stroke. The patient's score is based upon: CHF History: 0 HTN History: 1 Diabetes History: 1 Stroke History: 0 Vascular Disease History: 0 Age Score: 0 Gender Score: 0         For questions or updates, please contact [place] consult [email or web address] for contact info under    Signed, EvLily KocherPA-C  06/01/2022 1:42 PM

## 2022-06-01 NOTE — Procedures (Signed)
   I was present at this dialysis session, have reviewed the session itself and made  appropriate changes Kelly Splinter MD [place] pager [phone]   06/01/2022, 1:57 PM

## 2022-06-01 NOTE — Progress Notes (Signed)
Received patient in bed to unit.  Alert and oriented.  Informed consent signed and in chart.   Treatment initiated: 9629 Treatment completed: 1200  Patient tolerated well.  Transported back to the room  Alert, without acute distress.  Hand-off given to patient's nurse.   Access used: AVF Access issues: NA  Total UF removed: 3063ml Medication(s) given: NA Post HD VS: 98.1    141/78    96    32    100% 2L [place] Post HD weight: 130.1kg   [facility]

## 2022-06-01 NOTE — Progress Notes (Signed)
[facility] [place] for warfarin/heparin Indication: atrial fibrillation  Allergies  Allergen Reactions   Penicillins Rash and Other (See Comments)    Has patient had a PCN reaction causing immediate rash, facial/tongue/throat swelling, SOB or lightheadedness with hypotension: yes Has patient had a PCN reaction causing severe rash involving mucus membranes or skin necrosis: no Has patient had a PCN reaction that required hospitalization: no Has patient had a PCN reaction occurring within the last 10 years: no If all of the above answers are "NO", then may proceed with Cephalosporin use.     Patient Measurements: Height: 6\' 1"  (185.4 cm) Weight: 130.1 kg (286 lb 13.1 oz) IBW/kg (Calculated) : 79.9  Vital Signs: Temp: 97.1 F (36.2 C) (10/04 1300) Temp Source: Oral (10/04 1300) BP: 145/77 (10/04 1300) Pulse Rate: 98 (10/04 1300)  Labs: Recent Labs    05/31/22 0606 05/31/22 0831 05/31/22 1245 05/31/22 1516 06/01/22 0109  HGB  --  11.4*  --   --  10.5*  HCT  --  33.9*  --   --  31.4*  PLT  --  236  --   --  179  LABPROT  --   --  32.8*  --  26.3*  INR  --   --  3.2*  --  2.4*  CREATININE 9.27*  --   --   --  10.36*  TROPONINIHS  --   --  9 11  --      Estimated Creatinine Clearance: 10.3 mL/min (A) (by C-G formula based on SCr of 10.36 mg/dL (H)).  Medications: Warfarin PTA -Home dose: 1.5 mg PO daily on Mon, Fri; 3 mg all other days -Last dose PTA: 05/30/22 @ 1730  Assessment: Pt is a 55 yoM with PMH significant for RCC, afib (on warfarin), ESRD on HD TTS, T2DM. Pt admitted with SOB - concern for pericarditis, PNA. Pharmacy consulted to dose warfarin.   INR today is therapeutic at 2.4. Coumadin now being held with plans for cath when INR <1.7. Heparin bridge ordered per pharmacy when INR <2.  Goal of Therapy:  INR 2-3 Monitor platelets by anticoagulation protocol: Yes   Plan:  Daily INR Will start heparin when INR < 2  Sherlon Handing, PharmD, BCPS Please see amion for complete clinical pharmacist phone list 06/01/2022 5:47 PM

## 2022-06-02 ENCOUNTER — Inpatient Hospital Stay (HOSPITAL_COMMUNITY): Payer: Medicare Other

## 2022-06-02 DIAGNOSIS — N186 End stage renal disease: Secondary | ICD-10-CM | POA: Diagnosis not present

## 2022-06-02 DIAGNOSIS — R079 Chest pain, unspecified: Secondary | ICD-10-CM | POA: Diagnosis not present

## 2022-06-02 DIAGNOSIS — J9601 Acute respiratory failure with hypoxia: Secondary | ICD-10-CM | POA: Diagnosis not present

## 2022-06-02 DIAGNOSIS — I10 Essential (primary) hypertension: Secondary | ICD-10-CM | POA: Diagnosis not present

## 2022-06-02 DIAGNOSIS — I480 Paroxysmal atrial fibrillation: Secondary | ICD-10-CM | POA: Diagnosis not present

## 2022-06-02 LAB — CBC
HCT: 30.8 % — ABNORMAL LOW (ref 39.0–52.0)
Hemoglobin: 10.9 g/dL — ABNORMAL LOW (ref 13.0–17.0)
MCH: 32.2 pg (ref 26.0–34.0)
MCHC: 35.4 g/dL (ref 30.0–36.0)
MCV: 91.1 fL (ref 80.0–100.0)
Platelets: 222 10*3/uL (ref 150–400)
RBC: 3.38 MIL/uL — ABNORMAL LOW (ref 4.22–5.81)
RDW: 14.5 % (ref 11.5–15.5)
WBC: 8.4 10*3/uL (ref 4.0–10.5)
nRBC: 0 % (ref 0.0–0.2)

## 2022-06-02 LAB — RENAL FUNCTION PANEL
Albumin: 3.2 g/dL — ABNORMAL LOW (ref 3.5–5.0)
Anion gap: 14 (ref 5–15)
BUN: 59 mg/dL — ABNORMAL HIGH (ref 8–23)
CO2: 19 mmol/L — ABNORMAL LOW (ref 22–32)
Calcium: 7.9 mg/dL — ABNORMAL LOW (ref 8.9–10.3)
Chloride: 100 mmol/L (ref 98–111)
Creatinine, Ser: 10 mg/dL — ABNORMAL HIGH (ref 0.61–1.24)
GFR, Estimated: 5 mL/min — ABNORMAL LOW (ref >60)
Glucose, Bld: 108 mg/dL — ABNORMAL HIGH (ref 70–99)
Phosphorus: 5.8 mg/dL — ABNORMAL HIGH (ref 2.5–4.6)
Potassium: 3.7 mmol/L (ref 3.5–5.1)
Sodium: 133 mmol/L — ABNORMAL LOW (ref 135–145)

## 2022-06-02 LAB — GLUCOSE, CAPILLARY
Glucose-Capillary: 98 mg/dL (ref 70–99)
Glucose-Capillary: 117 mg/dL — ABNORMAL HIGH (ref 70–99)

## 2022-06-02 LAB — MAGNESIUM: Magnesium: 1.6 mg/dL — ABNORMAL LOW (ref 1.7–2.4)

## 2022-06-02 LAB — PROTIME-INR
INR: 2.1 — ABNORMAL HIGH (ref 0.8–1.2)
Prothrombin Time: 23 seconds — ABNORMAL HIGH (ref 11.4–15.2)

## 2022-06-02 LAB — HEPATITIS B SURFACE ANTIBODY, QUANTITATIVE: Hep B S AB Quant (Post): 225.6 m[IU]/mL (ref >9.9)

## 2022-06-02 MED ORDER — MAGNESIUM SULFATE 2 GM/50ML IV SOLN
2.0000 g | Freq: Once | INTRAVENOUS | Status: AC
  Administered 2022-06-02: 2 g via INTRAVENOUS
  Filled 2022-06-02: qty 50

## 2022-06-02 NOTE — Progress Notes (Signed)
Rounding Note    Patient Name: George Lewis. Date of Encounter: 06/02/2022  [place] Cardiologist: Elouise Munroe, MD   Subjective   No acute overnight events. Saw patient today during dialysis. He still has some shortness of breath but does note some improvement since admission. He was able to sleep on a little flatter surface last night. However, he is still not back at his baseline. He describes some pleuritic chest pain that he describes as soreness when taking a deep breath. No recent palpitations.  Inpatient Medications    Scheduled Meds:  allopurinol  100 mg Oral QPM   amiodarone  200 mg Oral QPM   atorvastatin  40 mg Oral QPM   azithromycin  500 mg Oral Daily   Chlorhexidine Gluconate Cloth  6 each Topical Q0600   Chlorhexidine Gluconate Cloth  6 each Topical Q0600   loratadine  10 mg Oral QPM   Continuous Infusions:  cefTRIAXone (ROCEPHIN)  IV     magnesium sulfate bolus IVPB     PRN Meds: acetaminophen **OR** acetaminophen, albuterol, fluticasone, guaiFENesin, prochlorperazine, traMADol   Vital Signs    Vitals:   06/02/22 0437 06/02/22 0830 06/02/22 0839 06/02/22 0856  BP: 119/71  134/67 134/71  Pulse: (!) 107  97 93  Resp: 16  (!) 33 (!) 32  Temp: 98.2 F (36.8 C)  98.4 F (36.9 C)   TempSrc: Oral  Oral   SpO2:   97% 97%  Weight: 128.1 kg 128.7 kg    Height:        Intake/Output Summary (Last 24 hours) at 06/02/2022 0924 Last data filed at 06/02/2022 0439 Gross per 24 hour  Intake 940 ml  Output 403 ml  Net 537 ml      06/02/2022    8:30 AM 06/02/2022    4:37 AM 06/01/2022   12:22 PM  Last 3 Weights  Weight (lbs) 283 lb 11.7 oz 282 lb 6.4 oz 286 lb 13.1 oz  Weight (kg) 128.7 kg 128.096 kg 130.1 kg      Telemetry    Sinus rhythm with rates in the 90s to low 100s.  - Personally Reviewed  ECG    No new ECG tracings today. - Personally Reviewed  Physical Exam   GEN: Morbidly obese Caucasian male in no acute  distress.   Neck: JVD difficult to assess due to body habitus. Cardiac: Tachycardic with normal rhythm. Possible very soft murmur noted.  Respiratory: No increased work of breathing. Clear to auscultation bilaterally. GI: Soft, non-distended, and non-tender. MS: No lower extremity edema. No deformity. Skin: Warm and dry. Neuro:  No focal deficits. Psych: Normal affect. Responds appropriately.  Labs    High Sensitivity Troponin:   Recent Labs  Lab 05/31/22 1245 05/31/22 1516  TROPONINIHS 9 11     Chemistry Recent Labs  Lab 05/31/22 0606 06/01/22 0109 06/02/22 0111  NA 136 133* 133*  K 4.4 4.6 3.7  CL 103 101 100  CO2 18* 16* 19*  GLUCOSE 92 83 108*  BUN 54* 57* 59*  CREATININE 9.27* 10.36* 10.00*  CALCIUM 8.0* 7.8* 7.9*  MG  --   --  1.6*  PROT  --  6.8  --   ALBUMIN  --  3.2* 3.2*  AST  --  17  --   ALT  --  11  --   ALKPHOS  --  37*  --   BILITOT  --  0.5  --   [facility]  6* 5* 5*  ANIONGAP 15 16* 14    Lipids No results for input(s): "CHOL", "TRIG", "HDL", "LABVLDL", "LDLCALC", "CHOLHDL" in the last 168 hours.  Hematology Recent Labs  Lab 05/31/22 0831 06/01/22 0109 06/02/22 0111  WBC 9.3 9.3 8.4  RBC 3.63* 3.33* 3.38*  HGB 11.4* 10.5* 10.9*  HCT 33.9* 31.4* 30.8*  MCV 93.4 94.3 91.1  MCH 31.4 31.5 32.2  MCHC 33.6 33.4 35.4  RDW 13.9 14.4 14.5  PLT 236 179 222   Thyroid No results for input(s): "TSH", "FREET4" in the last 168 hours.  BNP Recent Labs  Lab 05/31/22 0606  BNP 462.7*    DDimer No results for input(s): "DDIMER" in the last 168 hours.   Radiology    ECHOCARDIOGRAM COMPLETE  Result Date: 06/01/2022    ECHOCARDIOGRAM REPORT   Patient Name:   George Lewis. Date of Exam: 06/01/2022 Medical Rec #:  [id number]             Height:       73.0 in Accession #:    [id number]            Weight:       293.4 lb Date of Birth:  01/13/1956             BSA:          2.532 m Patient Age:    66 years              BP:           141/78 mmHg Patient  Gender: M                     HR:           95 bpm. Exam Location:  Inpatient Procedure: 2D Echo, Cardiac Doppler, Color Doppler and Intracardiac            Opacification Agent Indications:    Other abnormalities of the heart R00.8  History:        Patient has prior history of Echocardiogram examinations, most                 recent 11/26/2018. Arrythmias:Atrial Fibrillation,                 Signs/Symptoms:Shortness of Breath and Chest Pain; Risk                 Factors:Dyslipidemia, Diabetes and Hypertension.  Sonographer:    Greer Pickerel Referring Phys: [id number] Jonnie Finner  Sonographer Comments: Technically challenging study due to limited acoustic windows, Technically difficult study due to poor echo windows, suboptimal apical window, no subcostal window and patient is obese. Image acquisition challenging due to respiratory motion. IMPRESSIONS  1. Left ventricular ejection fraction, by estimation, is 55 to 60%. The left ventricle has normal function. The left ventricle has no regional wall motion abnormalities. Left ventricular diastolic parameters are consistent with Grade II diastolic dysfunction (pseudonormalization).  2. Right ventricular systolic function is normal. The right ventricular size is normal. Tricuspid regurgitation signal is inadequate for assessing PA pressure.  3. Left atrial size was mildly dilated.  4. Right atrial size was mildly dilated.  5. The mitral valve is normal in structure. Mild mitral valve regurgitation. No evidence of mitral stenosis.  6. The aortic valve is tricuspid. There is moderate calcification of the aortic valve. Aortic valve regurgitation is not visualized. Mild to moderate aortic valve stenosis. Aortic valve area, by VTI measures  1.44 cm. Aortic valve mean gradient measures  18.0 mmHg.  7. Aortic dilatation noted. There is mild dilatation of the ascending aorta, measuring 41 mm.  8. IVC not well-visualized . FINDINGS  Left Ventricle: Left ventricular ejection  fraction, by estimation, is 55 to 60%. The left ventricle has normal function. The left ventricle has no regional wall motion abnormalities. Definity contrast agent was given IV to delineate the left ventricular  endocardial borders. The left ventricular internal cavity size was normal in size. There is no left ventricular hypertrophy. Left ventricular diastolic parameters are consistent with Grade II diastolic dysfunction (pseudonormalization). Right Ventricle: The right ventricular size is normal. No increase in right ventricular wall thickness. Right ventricular systolic function is normal. Tricuspid regurgitation signal is inadequate for assessing PA pressure. Left Atrium: Left atrial size was mildly dilated. Right Atrium: Right atrial size was mildly dilated. Pericardium: There is no evidence of pericardial effusion. Mitral Valve: The mitral valve is normal in structure. There is mild calcification of the mitral valve leaflet(s). Mild mitral annular calcification. Mild mitral valve regurgitation. No evidence of mitral valve stenosis. Tricuspid Valve: The tricuspid valve is normal in structure. Tricuspid valve regurgitation is not demonstrated. Aortic Valve: The aortic valve is tricuspid. There is moderate calcification of the aortic valve. Aortic valve regurgitation is not visualized. Mild to moderate aortic stenosis is present. Aortic valve mean gradient measures 18.0 mmHg. Aortic valve peak gradient measures 29.1 mmHg. Aortic valve area, by VTI measures 1.44 cm. Pulmonic Valve: The pulmonic valve was normal in structure. Pulmonic valve regurgitation is not visualized. Aorta: The aortic root is normal in size and structure and aortic dilatation noted. There is mild dilatation of the ascending aorta, measuring 41 mm. Venous: The inferior vena cava was not well visualized. IAS/Shunts: No atrial level shunt detected by color flow Doppler.  LEFT VENTRICLE PLAX 2D LVIDd:         5.40 cm   Diastology LVIDs:          3.80 cm   LV e' medial:    7.51 cm/s LV PW:         1.50 cm   LV E/e' medial:  10.4 LV IVS:        1.10 cm   LV e' lateral:   6.42 cm/s LVOT diam:     2.17 cm   LV E/e' lateral: 12.2 LV SV:         63 LV SV Index:   25 LVOT Area:     3.70 cm  RIGHT VENTRICLE RV S prime:     11.00 cm/s TAPSE (M-mode): 2.0 cm LEFT ATRIUM             Index        RIGHT ATRIUM           Index LA diam:        4.60 cm 1.82 cm/m   RA Area:     39.50 cm LA Vol (A2C):   63.6 ml 25.12 ml/m  RA Volume:   144.00 ml 56.86 ml/m LA Vol (A4C):   76.9 ml 30.37 ml/m LA Biplane Vol: 74.0 ml 29.22 ml/m  AORTIC VALVE AV Area (Vmax):    1.31 cm AV Area (Vmean):   1.27 cm AV Area (VTI):     1.44 cm AV Vmax:           269.50 cm/s AV Vmean:          192.500 cm/s AV VTI:  0.440 m AV Peak Grad:      29.1 mmHg AV Mean Grad:      18.0 mmHg LVOT Vmax:         95.60 cm/s LVOT Vmean:        65.900 cm/s LVOT VTI:          0.171 m LVOT/AV VTI ratio: 0.39  AORTA Ao Root diam: 3.40 cm Ao Asc diam:  4.05 cm MITRAL VALVE MV Area (PHT): 5.38 cm    SHUNTS MV Decel Time: 141 msec    Systemic VTI:  0.17 m MV E velocity: 78.30 cm/s  Systemic Diam: 2.17 cm MV A velocity: 80.60 cm/s MV E/A ratio:  0.97 Dalton McleanMD Electronically signed by Franki Monte Signature Date/Time: 06/01/2022/3:43:42 PM    Final    CT Angio Chest PE W and/or Wo Contrast  Result Date: 05/31/2022 CLINICAL DATA:  Cough, dyspnea, fatigue, fever arthralgias. History of renal cell EXAM: CT ANGIOGRAPHY CHEST WITH CONTRAST TECHNIQUE: Multidetector CT imaging of the chest was performed using the standard protocol during bolus administration of intravenous contrast. Multiplanar CT image reconstructions and MIPs were obtained to evaluate the vascular anatomy. RADIATION DOSE REDUCTION: This exam was performed according to the departmental dose-optimization program which includes automated exposure control, adjustment of the mA and/or kV according to patient size and/or use of  iterative reconstruction technique. CONTRAST:  36m OMNIPAQUE IOHEXOL 350 MG/ML SOLN COMPARISON:  CT chest 04/17/2022 FINDINGS: Cardiovascular: There is adequate opacification of the pulmonary arteries to the segmental level. There is no evidence of pulmonary embolism. The heart size is stable. There is no pericardial effusion. Aortic valve and coronary artery calcifications are again seen. The thoracic aorta is normal in appearance. Mediastinum/Nodes: The thyroid is unremarkable. The esophagus is grossly unremarkable. A 2.0 cm posterior mediastinal lymph node is not significantly changed in size. There is no new or progressive mediastinal lymphadenopathy. There is no hilar or axillary lymphadenopathy. Lungs/Pleura: The trachea and central airways are patent. Lung volumes are low. A small left pleural effusion and adjacent airspace opacity are unchanged since the CT chest from 04/20/2022. Consolidation and architectural distortion in the left upper lobe along the major fissure are unchanged. There is a new small right pleural effusion with adjacent consolidation. The 0.9 cm nodule in the left upper lobe is unchanged (6-89). Additional nodules in the right lower lobe are not seen on the current study, obscured by consolidation. Upper Abdomen: The imaged portions of the upper abdominal viscera are unremarkable. Musculoskeletal: There is no acute osseous abnormality or suspicious osseous lesion. Review of the MIP images confirms the above findings. IMPRESSION: 1. No evidence of pulmonary embolism. 2. New small right pleural effusion with adjacent consolidation. Pneumonia is not excluded. 3. Unchanged left pleural effusion and adjacent airspace opacity/volume loss. Unchanged consolidation and architectural distortion in the left upper lobe along the major fissure. 4. Unchanged left upper lobe pulmonary nodule measuring 0.9 cm. Additional right lower lobe nodules are obscured by the consolidation and not well assessed.  5. Unchanged 2.0 cm mediastinal lymph node. Electronically Signed   By: PValetta MoleM.D.   On: 05/31/2022 10:57    Cardiac Studies   Echocardiogram 06/01/2022: Impressions:  1. Left ventricular ejection fraction, by estimation, is 55 to 60%. The  left ventricle has normal function. The left ventricle has no regional  wall motion abnormalities. Left ventricular diastolic parameters are  consistent with Grade II diastolic  dysfunction (pseudonormalization).   2. Right ventricular systolic function is normal.  The right ventricular  size is normal. Tricuspid regurgitation signal is inadequate for assessing  PA pressure.   3. Left atrial size was mildly dilated.   4. Right atrial size was mildly dilated.   5. The mitral valve is normal in structure. Mild mitral valve  regurgitation. No evidence of mitral stenosis.   6. The aortic valve is tricuspid. There is moderate calcification of the  aortic valve. Aortic valve regurgitation is not visualized. Mild to  moderate aortic valve stenosis. Aortic valve area, by VTI measures 1.44  cm. Aortic valve mean gradient measures   18.0 mmHg.   7. Aortic dilatation noted. There is mild dilatation of the ascending  aorta, measuring 41 mm.   8. IVC not well-visualized .    Patient Profile     66 y.o. 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  Assessment & Plan    Dyspnea Possible Acute Diastolic CHF Patient presented with progressive shortness of breath and weakness.  BNP elevated at 462 may be underrepresented given  morbid obesity.  Chest x-ray showed chronic cardiomegaly with mild right base opacity with low lung volumes favoring atelectasis.  CTA was negative for PE but did show a new small right pleural effusion with adjacent consolidation as well as an unchanged left pleural effusion.  Echo showed EF of 55-60% with no regional wall motion abnormalities and grade 2 diastolic dysfunction  as well as mild MR, mild to moderate AS with mean gradient of 18 mmHg, and mild dilation fo the ascending aorta measuring 41 mm. Overall picture is confusing.  Scribes aggressive shortness of breath and orthopnea but states he has not been gaining weight and they have had trouble removing fluid during dialysis sessions due to hypotension.   - Volume status somewhat difficult to assess due to body habitus.  - Continue dialysis per nephrology.   - Plan is for right/left cardiac catheterization to better assess volume status once his INR level gets below 1.7.  If this is unremarkable, plan will be to get PFTs with DLCO to rule out Amiodarone toxicity he has been on this for several years.  INR is 2.1 today.  I doubt that it will drop to 1.7 tomorrow but will make NPO at midnight just in case.  Shared Decision Making/Informed Consent{ The risks [stroke (1 in 1000), death (1 in 1000), kidney failure [usually temporary] (1 in 500), bleeding (1 in 200), allergic reaction [possibly serious] (1 in 200)], benefits (diagnostic support and management of coronary artery disease) and alternatives of a cardiac catheterization were discussed in detail with Mr. Ruta and he is willing to proceed.  Chest Pain Patient presented with chest pain that was somewhat positional and strongly associated with orthopnea. EKG showed no acute ischemic changes and high-sensitivity troponin negative x2. ESR and CRP elevated at 76 and 3.0 respectively. Chest CTA negative for PE. Echo showed normal LV function and wall motion. - Still having some pleuritic chest  pain. - Chest pain very atypical and not consistent with ACS or acute pericarditis. However, plan is for right /left cardiac catheterization for further evaluation of chest pain and shortness of breath  once INR gets below 1.7.   Paroxysmal Atrial Fibrillation Maintaining sinus rhythm. Rates in the 90s to low 100s. - Continue Amiodarone 213m daily. - On chronic anticoagulation with Coumadin at home. This is currently on hold in anticipation for heart cath. INR 2.1 today. Start IV Heparin per Pharmacy protocol when able.  Hypertension BP initially elevated but better today with dialysis. He has had problems with BP dropping to low during dialysis requiring Midodrine. - Continue to monitor. Will not add any antihypertensives at this time.  Hyperlipidemia Continue Lipitor 444mdaily.  ESRD On hemodialysis T/Th/Sat. - Management per Nephrology.  Otherwise, per primary team: - Community acquired pneumonia vs Keytruda induced pneumonitis - Metastatic renal cell carcinoma - Type 2 diabetes mellitus  For questions or updates, please contact [facility] [place] consult [email or web address] for contact info under        Signed, CaDarreld McleanPA-C  06/02/2022, 9:24 AM

## 2022-06-02 NOTE — Progress Notes (Signed)
   06/02/22 1400  Vitals  Temp 98.1 F (36.7 C)  Temp Source Oral  BP 114/68  MAP (mmHg) 79  BP Location Right Arm  BP Method Automatic  Patient Position (if appropriate) Lying  Pulse Rate (!) 114  ECG Heart Rate (!) 114  Resp (!) 26  Level of Consciousness  Level of Consciousness Alert  MEWS COLOR  MEWS Score Color Red  Oxygen Therapy  SpO2 97 %  O2 Device Room Air  Patient Activity (if Appropriate) In bed  Pain Assessment  Pain Scale 0-10  Pain Score 0  PCA/Epidural/Spinal Assessment  Respiratory Pattern Regular;Dyspnea with exertion  MEWS Score  MEWS Temp 0  MEWS Systolic 0  MEWS Pulse 2  MEWS RR 2  MEWS LOC 0  MEWS Score 4   Patients baseline. Chest xray repeat.

## 2022-06-02 NOTE — Progress Notes (Signed)
Progress Note Patient: George Lewis. QZR:[id number] DOB: 1956/07/22 DOA: 05/31/2022  DOS: the patient was seen and examined on 06/02/2022  Brief hospital course: Sadat Sliwa. is a 66 y.o. male with medical history significant of RCC, A fib on coumadin, HLD, ESRD on HD TTS, DM2.  Presented to the hospital with complaints of progressive worsening shortness of breath ongoing for last 7 days after starting to take new medication for his RCC INLYTA. Cardiology and nephrology consulted.  Assessment and Plan: Acute on chronic diastolic CHF. Paroxysmal A-fib with RVR. Small right pleural effusion with atelectasis. Possible medication side effect. Patient presents with complaints of shortness of breath.  This started immediately after him starting INLYTA. Symptoms progressively worsen without any swelling of the legs or weight gain to the point that he was not able to go to hemodialysis at which point he was brought to the ER. Treated with HD with improvement in symptoms. Echocardiogram shows preserved EF.  No pericardial effusion.  No significant valvular or wall motion abnormalities as well. On exam has faint basilar crackles more on the right side. For now continue volume removal with HD. Patient appears to have intermittent RVR with heart rate ranging from 105 to 130 at the time of my evaluation. On amiodarone.  For now continue.  That can be amiodarone toxicity which can also cause shortness of breath.  Continue anticoagulation. Management per cardiology.  Chest pain. There was some concern for pleuritic chest pain which might be due to pericarditis. Currently based on the exam findings as well as history I do not think the patient has pericarditis and therefore we will discontinue this medication.  Community-acquired pneumonia For now we will continue with IV antibiotics and follow-up on cultures. Oncology does not think the patient has Keytruda induced pneumonitis.  Type 2  diabetes mellitus, diet controlled. Well-controlled 5.3 hemoglobin A1c. Continue current modified diet.  RCC. Follows up with Dr. Alen Blew. Monitor.  Currently holding off on Inlyta.  HLD. Continue statin.  ESRD on HD on TTS schedule. Nephrology consult for Appreciate assistance. Transfer from [place] to [facility] for HD needs. Underwent urgent HD on Wednesday on 10/4.  Subjective: Breathing better.  No nausea no vomiting no fever no chills.  Physical Exam: Vitals:   06/02/22 1243 06/02/22 1310 06/02/22 1400 06/02/22 1700  BP: 129/77  114/68   Pulse: (!) 118  (!) 114 (!) 111  Resp: (!) 33  (!) 26   Temp: 98.7 F (37.1 C)  98.1 F (36.7 C)   TempSrc: Oral  Oral   SpO2: 97%  97% 96%  Weight: 126.1 kg 126.1 kg    Height:       General: Appear in mild distress, no Rash; Oral Mucosa Clear, moist. no Abnormal Neck Mass Or lumps, Conjunctiva normal  Cardiovascular: S1 and S2 Present, no Murmur, Respiratory: good respiratory effort, Bilateral Air entry present and CTA, no Crackles, no wheezes Abdomen: Bowel Sound present, Soft and no tenderness Extremities: no Pedal edema Neurology: alert and oriented to time, place, and person affect appropriate. no new focal deficit Gait not checked due to patient safety concerns   Data Reviewed: I have Reviewed nursing notes, Vitals, and Lab results since pt's last encounter. Pertinent lab results CBC BMP I have ordered test including CBC and BMP I have discussed pt's care plan and test results with nephrology and oncology.    Family Communication: Wife at bedside  Disposition: Status is: Inpatient Remains inpatient appropriate because: Will require  cardiac catheterization.  For which the patient will need to be off of warfarin with INR less than 1.7.  Most likely cath on Monday.  Author: Berle Mull, MD 06/02/2022 7:58 PM  Please look on [email or web address] to find out who is on call.

## 2022-06-02 NOTE — Progress Notes (Signed)
Received patient in bed to unit.  Alert and oriented.  Informed consent signed and in chart.   Treatment initiated: 3159 Treatment completed: 1243  Patient tolerated well.  Transported back to the room  Alert, without acute distress.  Hand-off given to patient's nurse.   Access used: L AVF  Access issues: no  Total UF removed: 2541ml Post HD VS: 98.7, 112, 35, 130/81, 96% RA  Post HD weight: 126.1 kg    [facility]

## 2022-06-02 NOTE — Progress Notes (Addendum)
[name] Progress Note  Subjective: SOB better today, but got real winded after getting up to stand and weight pre HD. BP's were stable and we got another 2.5 L off today, now is down   Vitals:   06/02/22 1200 06/02/22 1230 06/02/22 1243 06/02/22 1310  BP: 120/70 122/79 129/77   Pulse: (!) 114 (!) 116 (!) 118   Resp: (!) 34 (!) 31 (!) 33   Temp:   98.7 F (37.1 C)   TempSrc:   Oral   SpO2: 98% 97% 97%   Weight:   126.1 kg 126.1 kg  Height:        Exam: Gen alert, no distress No jvd or bruits Chest clear bilat to bases RRR no MRG Abd soft ntnd no mass or ascites +bs Ext trace pretib edema Neuro is alert, Ox 3 , nf    LUA AVF+bruit    Home meds include - allopurinol, amiodarone, arovastatin, axitinib, omeprazole, pembrolizumab, tramadol, warfarin, gabapentin, insulin glargine, midodrine 10mg  preHD tts, prns/ vits/ supps    OP HD: TTS [facility] 4h 450/ 600   130.9kg  3K/2.5bath  P2  Hep none  LUA AVF - last HD 9/30, post wt 131.2kg - rocaltrol 0.5 mcg tiw po - last Hb 11.4, no esa     CXR - poor film, IMPRESSION: 1. Mild right base opacity with low lung volumes favoring atelectasis. 2. Chronic cardiomegaly.    BP 155/ 75, HR 80-90, RR 17- 25, afeb, 4L [place]     CTA chest - no PE, mild bilat effusions w/ mild adjacent bilat atx vs consolidation   Assessment/ Plan: Chest pain - trops negative, EKG unremarkable. CTA no PE. Possibly due to vol overload. Cardiology planning  SOB - suspect this may mostly be vol overload. CXR w/ vasc congestion. Pulled 5.5 L off yest and today and is now 4 kg under his prior dry wt. Recommend ambulating post HD to see how he tolerates movement and get O2 sats as well.  ESRD - on HD TTS. Had HD here yesterday, and today to get back on schedule.  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  MBD ckd - CCa in range, cont vdra po w/ HD DM2 - on insulin, per pmd      Rob  John Vasconcelos 06/02/2022, 2:30 PM   Recent Labs  Lab 06/01/22 0109 06/02/22 0111  HGB 10.5* 10.9*  ALBUMIN 3.2* 3.2*  CALCIUM 7.8* 7.9*  PHOS  --  5.8*  CREATININE 10.36* 10.00*  K 4.6 3.7   No results for input(s): "IRON", "TIBC", "FERRITIN" in the last 168 hours. Inpatient medications:  allopurinol  100 mg Oral QPM   amiodarone  200 mg Oral QPM   atorvastatin  40 mg Oral QPM   Chlorhexidine Gluconate Cloth  6 each Topical Q0600   Chlorhexidine Gluconate Cloth  6 each Topical Q0600   loratadine  10 mg Oral QPM    cefTRIAXone (ROCEPHIN)  IV Stopped (06/02/22 1354)   magnesium sulfate bolus IVPB 2 g (06/02/22 1409)   acetaminophen **OR** acetaminophen, albuterol, fluticasone, guaiFENesin, prochlorperazine, traMADol

## 2022-06-02 NOTE — Progress Notes (Signed)
Patient was already on CPAP for the night. Resting comfortably. Vitals stable.

## 2022-06-02 NOTE — TOC Progression Note (Signed)
Transition of Care (TOC) - Progression Note    Patient Details  Name: George Lewis. MRN: [id number] Date of Birth: [birth year]  Transition of Care [facility]) CM/SW Contact  Zenon Mayo, RN Phone Number: 06/02/2022, 4:43 PM  Clinical Narrative:    from home  with wife, her with dyspnea, HD patient chronic, , change to po abx, plan for cath, hold coumadine, want INR to trend down, will do cath on Monday. TOC following.        Expected Discharge Plan and Services                                                 Social Determinants of Health (SDOH) Interventions    Readmission Risk Interventions    10/19/2020    9:22 AM  Readmission Risk Prevention Plan  Transportation Screening Complete  HRI or Home Care Consult Complete  Social Work Consult for [place] Planning/Counseling Complete  Palliative Care Screening Not Applicable  Medication Review Press photographer) Complete

## 2022-06-02 NOTE — Progress Notes (Signed)
Pt receives out-pt HD at [facility] on TTS. Pt arrives at 5:30 am for for 5:45 chair time. Will assist as needed.   Melven Sartorius Renal Navigator [phone]

## 2022-06-02 NOTE — Plan of Care (Signed)
PT alert and oriented. Went for an extra day of dialysis this morning, states he tolerated it will but has a headache which he believes is due to the treatment. PRN Tylenol given with good effect.  VSS, does have tachycardia which PT and wife state is not unusual for him. Dypnea and increased heart rate with exertion. Eating well. Verbalized understanding of dietary changes with Coumadin therapy and that he is off Coumadin anticoagulation until his cardiac procedure. Wife in room and verbalized understanding of the plan of care.    Problem: Education: Goal: Knowledge of General Education information will improve Description: Including pain rating scale, medication(s)/side effects and non-pharmacologic comfort measures Outcome: Progressing   Problem: Health Behavior/Discharge Planning: Goal: Ability to manage health-related needs will improve Outcome: Progressing   Problem: Clinical Measurements: Goal: Ability to maintain clinical measurements within normal limits will improve Outcome: Progressing Goal: Will remain free from infection Outcome: Progressing Goal: Diagnostic test results will improve Outcome: Progressing Goal: Respiratory complications will improve Outcome: Progressing Goal: Cardiovascular complication will be avoided Outcome: Progressing   Problem: Activity: Goal: Risk for activity intolerance will decrease Outcome: Progressing   Problem: Nutrition: Goal: Adequate nutrition will be maintained Outcome: Progressing   Problem: Coping: Goal: Level of anxiety will decrease Outcome: Progressing   Problem: Elimination: Goal: Will not experience complications related to bowel motility Outcome: Progressing Goal: Will not experience complications related to urinary retention Outcome: Progressing   Problem: Pain Managment: Goal: General experience of comfort will improve Outcome: Progressing   Problem: Safety: Goal: Ability to remain free from injury will  improve Outcome: Progressing   Problem: Skin Integrity: Goal: Risk for impaired skin integrity will decrease Outcome: Progressing   Problem: Activity: Goal: Ability to tolerate increased activity will improve Outcome: Progressing   Problem: Clinical Measurements: Goal: Ability to maintain a body temperature in the normal range will improve Outcome: Progressing   Problem: Respiratory: Goal: Ability to maintain adequate ventilation will improve Outcome: Progressing Goal: Ability to maintain a clear airway will improve Outcome: Progressing

## 2022-06-03 ENCOUNTER — Encounter (HOSPITAL_COMMUNITY)
Admission: EM | Disposition: A | Discharge status: Home / Self Care | Payer: Self-pay | Source: Home / Self Care | Attending: Internal Medicine

## 2022-06-03 ENCOUNTER — Inpatient Hospital Stay: Payer: Medicare Other | Admitting: Oncology

## 2022-06-03 ENCOUNTER — Inpatient Hospital Stay: Payer: Medicare Other

## 2022-06-03 DIAGNOSIS — I2510 Atherosclerotic heart disease of native coronary artery without angina pectoris: Secondary | ICD-10-CM

## 2022-06-03 DIAGNOSIS — N186 End stage renal disease: Secondary | ICD-10-CM | POA: Diagnosis not present

## 2022-06-03 DIAGNOSIS — I5033 Acute on chronic diastolic (congestive) heart failure: Secondary | ICD-10-CM

## 2022-06-03 DIAGNOSIS — I10 Essential (primary) hypertension: Secondary | ICD-10-CM | POA: Diagnosis not present

## 2022-06-03 DIAGNOSIS — J9601 Acute respiratory failure with hypoxia: Secondary | ICD-10-CM | POA: Diagnosis not present

## 2022-06-03 DIAGNOSIS — R079 Chest pain, unspecified: Secondary | ICD-10-CM | POA: Diagnosis not present

## 2022-06-03 DIAGNOSIS — E7800 Pure hypercholesterolemia, unspecified: Secondary | ICD-10-CM | POA: Diagnosis not present

## 2022-06-03 HISTORY — PX: CORONARY PRESSURE WIRE/FFR WITH 3D MAPPING: CATH118309

## 2022-06-03 HISTORY — PX: RIGHT/LEFT HEART CATH AND CORONARY ANGIOGRAPHY: CATH118266

## 2022-06-03 HISTORY — DX: Atherosclerotic heart disease of native coronary artery without angina pectoris: I25.10

## 2022-06-03 HISTORY — PX: CORONARY STENT INTERVENTION: CATH118234

## 2022-06-03 LAB — CBC
HCT: 34.6 % — ABNORMAL LOW (ref 39.0–52.0)
Hemoglobin: 11.8 g/dL — ABNORMAL LOW (ref 13.0–17.0)
MCH: 31.3 pg (ref 26.0–34.0)
MCHC: 34.1 g/dL (ref 30.0–36.0)
MCV: 91.8 fL (ref 80.0–100.0)
Platelets: 251 10*3/uL (ref 150–400)
RBC: 3.77 MIL/uL — ABNORMAL LOW (ref 4.22–5.81)
RDW: 14.3 % (ref 11.5–15.5)
WBC: 8.4 10*3/uL (ref 4.0–10.5)
nRBC: 0 % (ref 0.0–0.2)

## 2022-06-03 LAB — RENAL FUNCTION PANEL
Albumin: 3.3 g/dL — ABNORMAL LOW (ref 3.5–5.0)
Anion gap: 17 — ABNORMAL HIGH (ref 5–15)
BUN: 45 mg/dL — ABNORMAL HIGH (ref 8–23)
CO2: 24 mmol/L (ref 22–32)
Calcium: 8.7 mg/dL — ABNORMAL LOW (ref 8.9–10.3)
Chloride: 95 mmol/L — ABNORMAL LOW (ref 98–111)
Creatinine, Ser: 8.03 mg/dL — ABNORMAL HIGH (ref 0.61–1.24)
GFR, Estimated: 7 mL/min — ABNORMAL LOW (ref >60)
Glucose, Bld: 94 mg/dL (ref 70–99)
Phosphorus: 6 mg/dL — ABNORMAL HIGH (ref 2.5–4.6)
Potassium: 3.5 mmol/L (ref 3.5–5.1)
Sodium: 136 mmol/L (ref 135–145)

## 2022-06-03 LAB — POCT I-STAT 7, (LYTES, BLD GAS, ICA,H+H)
Acid-base deficit: 2 mmol/L (ref 0.0–2.0)
Bicarbonate: 22.9 mmol/L (ref 20.0–28.0)
Calcium, Ion: 1.02 mmol/L — ABNORMAL LOW (ref 1.15–1.40)
HCT: 35 % — ABNORMAL LOW (ref 39.0–52.0)
Hemoglobin: 11.9 g/dL — ABNORMAL LOW (ref 13.0–17.0)
O2 Saturation: 98 %
Potassium: 3.7 mmol/L (ref 3.5–5.1)
Sodium: 136 mmol/L (ref 135–145)
TCO2: 24 mmol/L (ref 22–32)
pCO2 arterial: 40.5 mmHg (ref 32–48)
pH, Arterial: 7.36 (ref 7.35–7.45)
pO2, Arterial: 109 mmHg — ABNORMAL HIGH (ref 83–108)

## 2022-06-03 LAB — POCT I-STAT EG7
Acid-base deficit: 2 mmol/L (ref 0.0–2.0)
Bicarbonate: 24 mmol/L (ref 20.0–28.0)
Calcium, Ion: 1.03 mmol/L — ABNORMAL LOW (ref 1.15–1.40)
HCT: 34 % — ABNORMAL LOW (ref 39.0–52.0)
Hemoglobin: 11.6 g/dL — ABNORMAL LOW (ref 13.0–17.0)
O2 Saturation: 88 %
Potassium: 3.7 mmol/L (ref 3.5–5.1)
Sodium: 135 mmol/L (ref 135–145)
TCO2: 25 mmol/L (ref 22–32)
pCO2, Ven: 43.7 mmHg — ABNORMAL LOW (ref 44–60)
pH, Ven: 7.348 (ref 7.25–7.43)
pO2, Ven: 58 mmHg — ABNORMAL HIGH (ref 32–45)

## 2022-06-03 LAB — PROTIME-INR
INR: 1.7 — ABNORMAL HIGH (ref 0.8–1.2)
Prothrombin Time: 19.6 seconds — ABNORMAL HIGH (ref 11.4–15.2)

## 2022-06-03 LAB — MAGNESIUM: Magnesium: 2.2 mg/dL (ref 1.7–2.4)

## 2022-06-03 LAB — POCT ACTIVATED CLOTTING TIME
Activated Clotting Time: 317 seconds
Activated Clotting Time: 269 seconds

## 2022-06-03 SURGERY — RIGHT/LEFT HEART CATH AND CORONARY ANGIOGRAPHY
Anesthesia: LOCAL

## 2022-06-03 MED ORDER — VERAPAMIL HCL 2.5 MG/ML IV SOLN
INTRAVENOUS | Status: AC
  Filled 2022-06-03: qty 2

## 2022-06-03 MED ORDER — HEPARIN SODIUM (PORCINE) 1000 UNIT/ML IJ SOLN
INTRAMUSCULAR | Status: DC | PRN
  Administered 2022-06-03 (×2): 5000 [IU] via INTRAVENOUS
  Administered 2022-06-03: 2000 [IU] via INTRAVENOUS

## 2022-06-03 MED ORDER — SODIUM CHLORIDE 0.9 % IV SOLN: 250.0000 mL | INTRAVENOUS | Status: DC | PRN

## 2022-06-03 MED ORDER — WARFARIN - PHARMACIST DOSING INPATIENT: Freq: Every day | Status: DC

## 2022-06-03 MED ORDER — ASPIRIN 81 MG PO CHEW: 81.0000 mg | CHEWABLE_TABLET | Freq: Once | ORAL | Status: DC

## 2022-06-03 MED ORDER — HEPARIN (PORCINE) 25000 UT/250ML-% IV SOLN
1500.0000 [IU]/h | INTRAVENOUS | Status: DC
  Administered 2022-06-03: 1500 [IU]/h via INTRAVENOUS
  Filled 2022-06-03: qty 250

## 2022-06-03 MED ORDER — VERAPAMIL HCL 2.5 MG/ML IV SOLN
INTRAVENOUS | Status: DC | PRN
  Administered 2022-06-03: 10 mL via INTRA_ARTERIAL

## 2022-06-03 MED ORDER — SODIUM CHLORIDE 0.9% FLUSH: 3.0000 mL | INTRAVENOUS | Status: DC | PRN

## 2022-06-03 MED ORDER — HYDRALAZINE HCL 20 MG/ML IJ SOLN: 10.0000 mg | INTRAMUSCULAR | Status: AC | PRN

## 2022-06-03 MED ORDER — FENTANYL CITRATE (PF) 100 MCG/2ML IJ SOLN
INTRAMUSCULAR | Status: AC
  Filled 2022-06-03: qty 2

## 2022-06-03 MED ORDER — CALCITRIOL 0.5 MCG PO CAPS
0.5000 ug | ORAL_CAPSULE | ORAL | Status: DC
  Administered 2022-06-04: 0.5 ug via ORAL
  Filled 2022-06-03: qty 1

## 2022-06-03 MED ORDER — CHLORHEXIDINE GLUCONATE CLOTH 2 % EX PADS: 6.0000 | MEDICATED_PAD | Freq: Every day | CUTANEOUS | Status: DC

## 2022-06-03 MED ORDER — CLOPIDOGREL BISULFATE 75 MG PO TABS
75.0000 mg | ORAL_TABLET | Freq: Every day | ORAL | Status: DC
  Administered 2022-06-04 – 2022-06-06 (×3): 75 mg via ORAL
  Filled 2022-06-03 (×3): qty 1

## 2022-06-03 MED ORDER — FENTANYL CITRATE (PF) 100 MCG/2ML IJ SOLN
INTRAMUSCULAR | Status: DC | PRN
  Administered 2022-06-03: 25 ug via INTRAVENOUS

## 2022-06-03 MED ORDER — MIDAZOLAM HCL 2 MG/2ML IJ SOLN
INTRAMUSCULAR | Status: AC
  Filled 2022-06-03: qty 2

## 2022-06-03 MED ORDER — SODIUM CHLORIDE 0.9 % IV SOLN
INTRAVENOUS | Status: AC | PRN
  Administered 2022-06-03: 10 mL/h via INTRAVENOUS

## 2022-06-03 MED ORDER — SODIUM CHLORIDE 0.9% FLUSH: 3.0000 mL | Freq: Two times a day (BID) | INTRAVENOUS | Status: DC

## 2022-06-03 MED ORDER — HEPARIN SODIUM (PORCINE) 1000 UNIT/ML IJ SOLN
INTRAMUSCULAR | Status: AC
  Filled 2022-06-03: qty 10

## 2022-06-03 MED ORDER — HEPARIN (PORCINE) IN NACL 1000-0.9 UT/500ML-% IV SOLN
INTRAVENOUS | Status: DC | PRN
  Administered 2022-06-03 (×2): 500 mL

## 2022-06-03 MED ORDER — LIDOCAINE HCL (PF) 1 % IJ SOLN
INTRAMUSCULAR | Status: AC
  Filled 2022-06-03: qty 30

## 2022-06-03 MED ORDER — FERRIC CITRATE 1 GM 210 MG(FE) PO TABS
210.0000 mg | ORAL_TABLET | Freq: Three times a day (TID) | ORAL | Status: DC
  Administered 2022-06-04 – 2022-06-06 (×6): 210 mg via ORAL
  Filled 2022-06-03 (×10): qty 1

## 2022-06-03 MED ORDER — SODIUM CHLORIDE 0.9 % IV SOLN: INTRAVENOUS | Status: DC

## 2022-06-03 MED ORDER — WARFARIN SODIUM 2 MG PO TABS
3.0000 mg | ORAL_TABLET | Freq: Once | ORAL | Status: AC
  Administered 2022-06-03: 3 mg via ORAL
  Filled 2022-06-03: qty 1

## 2022-06-03 MED ORDER — IOHEXOL 350 MG/ML SOLN
INTRAVENOUS | Status: DC | PRN
  Administered 2022-06-03: 180 mL

## 2022-06-03 MED ORDER — LIDOCAINE HCL (PF) 1 % IJ SOLN
INTRAMUSCULAR | Status: DC | PRN
  Administered 2022-06-03: 5 mL

## 2022-06-03 MED ORDER — ACETAMINOPHEN 325 MG PO TABS: 650.0000 mg | ORAL_TABLET | ORAL | Status: DC | PRN

## 2022-06-03 MED ORDER — ONDANSETRON HCL 4 MG/2ML IJ SOLN: 4.0000 mg | Freq: Four times a day (QID) | INTRAMUSCULAR | Status: DC | PRN

## 2022-06-03 MED ORDER — MIDAZOLAM HCL 2 MG/2ML IJ SOLN
INTRAMUSCULAR | Status: DC | PRN
  Administered 2022-06-03: 1 mg via INTRAVENOUS

## 2022-06-03 MED ORDER — CLOPIDOGREL BISULFATE 300 MG PO TABS
ORAL_TABLET | ORAL | Status: AC
  Filled 2022-06-03: qty 2

## 2022-06-03 MED ORDER — CLOPIDOGREL BISULFATE 300 MG PO TABS
ORAL_TABLET | ORAL | Status: DC | PRN
  Administered 2022-06-03: 600 mg via ORAL

## 2022-06-03 MED ORDER — ASPIRIN 81 MG PO CHEW
81.0000 mg | CHEWABLE_TABLET | Freq: Every day | ORAL | Status: DC
  Administered 2022-06-03 – 2022-06-06 (×4): 81 mg via ORAL
  Filled 2022-06-03 (×4): qty 1

## 2022-06-03 MED ORDER — SODIUM CHLORIDE 0.9% FLUSH
3.0000 mL | Freq: Two times a day (BID) | INTRAVENOUS | Status: DC
  Administered 2022-06-03 – 2022-06-06 (×4): 3 mL via INTRAVENOUS

## 2022-06-03 MED ORDER — LABETALOL HCL 5 MG/ML IV SOLN
10.0000 mg | INTRAVENOUS | Status: AC | PRN
  Administered 2022-06-03: 10 mg via INTRAVENOUS
  Filled 2022-06-03: qty 4

## 2022-06-03 SURGICAL SUPPLY — 30 items
BAG SNAP BAND KOVER 36X36 (MISCELLANEOUS) IMPLANT
BALL SAPPHIRE NC24 3.0X26 (BALLOONS) ×1
BALLN EMERGE MR 2.5X15 (BALLOONS) ×1
BALLOON EMERGE MR 2.5X15 (BALLOONS) IMPLANT
BALLOON SAPPHIRE NC24 3.0X26 (BALLOONS) IMPLANT
CATH BALLN WEDGE 5F 110CM (CATHETERS) IMPLANT
CATH DIAG 6FR JR4 (CATHETERS) IMPLANT
CATH INFINITI 6F FL3.5 (CATHETERS) IMPLANT
CATH OPTITORQUE TIG 4.0 6F (CATHETERS) IMPLANT
CATH TELESCOPE 6F GEC (CATHETERS) IMPLANT
CATH VISTA GUIDE 6FR XBLAD3.5 (CATHETERS) IMPLANT
DEVICE RAD COMP TR BAND LRG (VASCULAR PRODUCTS) IMPLANT
GLIDESHEATH SLEND SS 6F .021 (SHEATH) IMPLANT
GUIDEWIRE INQWIRE 1.5J.035X260 (WIRE) IMPLANT
GUIDEWIRE PRESSURE X 175 (WIRE) IMPLANT
INQWIRE 1.5J .035X260CM (WIRE) ×1
KIT ENCORE 26 ADVANTAGE (KITS) IMPLANT
KIT ESSENTIALS PG (KITS) IMPLANT
KIT HEART LEFT (KITS) ×1 IMPLANT
PACK CARDIAC CATHETERIZATION (CUSTOM PROCEDURE TRAY) ×1 IMPLANT
SHEATH GLIDE SLENDER 4/5FR (SHEATH) IMPLANT
SHEATH PROBE COVER 6X72 (BAG) IMPLANT
STENT SYNERGY XD 3.0X48 (Permanent Stent) IMPLANT
SYNERGY XD 3.0X48 (Permanent Stent) ×1 IMPLANT
TRANSDUCER W/STOPCOCK (MISCELLANEOUS) ×1 IMPLANT
TUBING CIL FLEX 10 FLL-RA (TUBING) ×1 IMPLANT
WIRE EMERALD 3MM-J .035X260CM (WIRE) IMPLANT
WIRE MICRO SET SILHO 5FR 7 (SHEATH) IMPLANT
WIRE MICROINTRODUCER 60CM (WIRE) IMPLANT
WIRE RUNTHROUGH IZANAI 014 180 (WIRE) IMPLANT

## 2022-06-03 NOTE — Progress Notes (Signed)
   06/03/22 1000  Mobility  Activity Ambulated with assistance in hallway  Activity Response Tolerated well  Distance Ambulated (ft) 500 ft  $Mobility charge 1 Mobility  Level of Assistance Standby assist, set-up cues, supervision of patient - no hands on  Assistive Device Other (Comment) (IV pole)  Mobility Referral Yes   Mobility Specialist Progress Note  Received pt in bed having no complaints and agreeable to mobility. Pt was asymptomatic throughout ambulation and returned to room w/o fault. Left in bed w/ call bell in reach and all needs met.   [name] Mobility Specialist

## 2022-06-03 NOTE — Progress Notes (Signed)
Rounding Note    Patient Name: George Lewis. Date of Encounter: 06/03/2022  [place] Cardiologist: Elouise Munroe, MD   Subjective   No acute overnight events. He reports improvement in his breathing but is still having significant shortness of breath with minimal activity. After walking back from the bathroom this morning, he had to put on his CPAP machine because he felt very short of breath. He reports some chest tightness but describes this as just the feeling like he breathing through a straw. Pleuritic pain has resolved.   Inpatient Medications    Scheduled Meds:  allopurinol  100 mg Oral QPM   amiodarone  200 mg Oral QPM   atorvastatin  40 mg Oral QPM   Chlorhexidine Gluconate Cloth  6 each Topical Q0600   Chlorhexidine Gluconate Cloth  6 each Topical Q0600   loratadine  10 mg Oral QPM   Continuous Infusions:  cefTRIAXone (ROCEPHIN)  IV Stopped (06/02/22 1354)   heparin     PRN Meds: acetaminophen **OR** acetaminophen, albuterol, fluticasone, guaiFENesin, prochlorperazine, traMADol   Vital Signs    Vitals:   06/02/22 1700 06/02/22 1955 06/02/22 2324 06/03/22 0423  BP:  117/69  114/69  Pulse: (!) 111 (!) 101 91   Resp:  (!) 22 20   Temp:  98.8 F (37.1 C)  98.1 F (36.7 C)  TempSrc:  Oral  Oral  SpO2: 96%  94% 96%  Weight:    125.7 kg  Height:        Intake/Output Summary (Last 24 hours) at 06/03/2022 0731 Last data filed at 06/02/2022 1525 Gross per 24 hour  Intake 265.52 ml  Output 2500 ml  Net -2234.48 ml      06/03/2022    4:23 AM 06/02/2022    1:10 PM 06/02/2022   12:43 PM  Last 3 Weights  Weight (lbs) 277 lb 1.9 oz 278 lb 278 lb  Weight (kg) 125.7 kg 126.1 kg 126.1 kg      Telemetry    Normal sinus rhythm with rates mostly in the 80s to 90s. - Personally Reviewed  ECG    No new ECG tracing today. - Personally Reviewed  Physical Exam   GEN: Morbidly obese Caucasian male in no acute distress.   Neck: JVD  difficult to assess due to body habitus. Cardiac: Tachycardic with normal rhythm. Possible very soft murmur noted.  Respiratory: No increased work of breathing. Mild crackles noted in right base but otherwise long clear. GI: Soft, non-distended, and non-tender. MS: No lower extremity edema. No deformity. Skin: Warm and dry. Neuro:  No focal deficits. Psych: Normal affect. Responds appropriately.  Labs    High Sensitivity Troponin:   Recent Labs  Lab 05/31/22 1245 05/31/22 1516  TROPONINIHS 9 11     Chemistry Recent Labs  Lab 06/01/22 0109 06/02/22 0111 06/03/22 0041  NA 133* 133* 136  K 4.6 3.7 3.5  CL 101 100 95*  CO2 16* 19* 24  GLUCOSE 83 108* 94  BUN 57* 59* 45*  CREATININE 10.36* 10.00* 8.03*  CALCIUM 7.8* 7.9* 8.7*  MG  --  1.6* 2.2  PROT 6.8  --   --   ALBUMIN 3.2* 3.2* 3.3*  AST 17  --   --   ALT 11  --   --   ALKPHOS 37*  --   --   BILITOT 0.5  --   --   GFRNONAA 5* 5* 7*  ANIONGAP 16* 14 17*  Lipids No results for input(s): "CHOL", "TRIG", "HDL", "LABVLDL", "LDLCALC", "CHOLHDL" in the last 168 hours.  Hematology Recent Labs  Lab 06/01/22 0109 06/02/22 0111 06/03/22 0041  WBC 9.3 8.4 8.4  RBC 3.33* 3.38* 3.77*  HGB 10.5* 10.9* 11.8*  HCT 31.4* 30.8* 34.6*  MCV 94.3 91.1 91.8  MCH 31.5 32.2 31.3  MCHC 33.4 35.4 34.1  RDW 14.4 14.5 14.3  PLT 179 222 251   Thyroid No results for input(s): "TSH", "FREET4" in the last 168 hours.  BNP Recent Labs  Lab 05/31/22 0606  BNP 462.7*    DDimer No results for input(s): "DDIMER" in the last 168 hours.   Radiology    DG Chest 2 View  Result Date: 06/02/2022 CLINICAL DATA:  Shortness of breath EXAM: CHEST - 2 VIEW COMPARISON:  05/31/2022 FINDINGS: Stable cardiomegaly. Low lung volumes. Trace left-sided pleural effusion with associated left basilar opacity. No pneumothorax. IMPRESSION: Stable radiographic appearance of the chest with trace left-sided pleural effusion and associated left basilar  opacity. Electronically Signed   By: Davina Poke D.O.   On: 06/02/2022 15:34   ECHOCARDIOGRAM COMPLETE  Result Date: 06/01/2022    ECHOCARDIOGRAM REPORT   Patient Name:   George Lewis. Date of Exam: 06/01/2022 Medical Rec #:  [id number]             Height:       73.0 in Accession #:    [id number]            Weight:       293.4 lb Date of Birth:  06/05/1956             BSA:          2.532 m Patient Age:    66 years              BP:           141/78 mmHg Patient Gender: M                     HR:           95 bpm. Exam Location:  Inpatient Procedure: 2D Echo, Cardiac Doppler, Color Doppler and Intracardiac            Opacification Agent Indications:    Other abnormalities of the heart R00.8  History:        Patient has prior history of Echocardiogram examinations, most                 recent 11/26/2018. Arrythmias:Atrial Fibrillation,                 Signs/Symptoms:Shortness of Breath and Chest Pain; Risk                 Factors:Dyslipidemia, Diabetes and Hypertension.  Sonographer:    Greer Pickerel Referring Phys: [id number] Jonnie Finner  Sonographer Comments: Technically challenging study due to limited acoustic windows, Technically difficult study due to poor echo windows, suboptimal apical window, no subcostal window and patient is obese. Image acquisition challenging due to respiratory motion. IMPRESSIONS  1. Left ventricular ejection fraction, by estimation, is 55 to 60%. The left ventricle has normal function. The left ventricle has no regional wall motion abnormalities. Left ventricular diastolic parameters are consistent with Grade II diastolic dysfunction (pseudonormalization).  2. Right ventricular systolic function is normal. The right ventricular size is normal. Tricuspid regurgitation signal is inadequate for assessing PA pressure.  3. Left atrial size was  mildly dilated.  4. Right atrial size was mildly dilated.  5. The mitral valve is normal in structure. Mild mitral valve regurgitation. No  evidence of mitral stenosis.  6. The aortic valve is tricuspid. There is moderate calcification of the aortic valve. Aortic valve regurgitation is not visualized. Mild to moderate aortic valve stenosis. Aortic valve area, by VTI measures 1.44 cm. Aortic valve mean gradient measures  18.0 mmHg.  7. Aortic dilatation noted. There is mild dilatation of the ascending aorta, measuring 41 mm.  8. IVC not well-visualized . FINDINGS  Left Ventricle: Left ventricular ejection fraction, by estimation, is 55 to 60%. The left ventricle has normal function. The left ventricle has no regional wall motion abnormalities. Definity contrast agent was given IV to delineate the left ventricular  endocardial borders. The left ventricular internal cavity size was normal in size. There is no left ventricular hypertrophy. Left ventricular diastolic parameters are consistent with Grade II diastolic dysfunction (pseudonormalization). Right Ventricle: The right ventricular size is normal. No increase in right ventricular wall thickness. Right ventricular systolic function is normal. Tricuspid regurgitation signal is inadequate for assessing PA pressure. Left Atrium: Left atrial size was mildly dilated. Right Atrium: Right atrial size was mildly dilated. Pericardium: There is no evidence of pericardial effusion. Mitral Valve: The mitral valve is normal in structure. There is mild calcification of the mitral valve leaflet(s). Mild mitral annular calcification. Mild mitral valve regurgitation. No evidence of mitral valve stenosis. Tricuspid Valve: The tricuspid valve is normal in structure. Tricuspid valve regurgitation is not demonstrated. Aortic Valve: The aortic valve is tricuspid. There is moderate calcification of the aortic valve. Aortic valve regurgitation is not visualized. Mild to moderate aortic stenosis is present. Aortic valve mean gradient measures 18.0 mmHg. Aortic valve peak gradient measures 29.1 mmHg. Aortic valve area, by VTI  measures 1.44 cm. Pulmonic Valve: The pulmonic valve was normal in structure. Pulmonic valve regurgitation is not visualized. Aorta: The aortic root is normal in size and structure and aortic dilatation noted. There is mild dilatation of the ascending aorta, measuring 41 mm. Venous: The inferior vena cava was not well visualized. IAS/Shunts: No atrial level shunt detected by color flow Doppler.  LEFT VENTRICLE PLAX 2D LVIDd:         5.40 cm   Diastology LVIDs:         3.80 cm   LV e' medial:    7.51 cm/s LV PW:         1.50 cm   LV E/e' medial:  10.4 LV IVS:        1.10 cm   LV e' lateral:   6.42 cm/s LVOT diam:     2.17 cm   LV E/e' lateral: 12.2 LV SV:         63 LV SV Index:   25 LVOT Area:     3.70 cm  RIGHT VENTRICLE RV S prime:     11.00 cm/s TAPSE (M-mode): 2.0 cm LEFT ATRIUM             Index        RIGHT ATRIUM           Index LA diam:        4.60 cm 1.82 cm/m   RA Area:     39.50 cm LA Vol (A2C):   63.6 ml 25.12 ml/m  RA Volume:   144.00 ml 56.86 ml/m LA Vol (A4C):   76.9 ml 30.37 ml/m LA Biplane Vol: 74.0 ml  29.22 ml/m  AORTIC VALVE AV Area (Vmax):    1.31 cm AV Area (Vmean):   1.27 cm AV Area (VTI):     1.44 cm AV Vmax:           269.50 cm/s AV Vmean:          192.500 cm/s AV VTI:            0.440 m AV Peak Grad:      29.1 mmHg AV Mean Grad:      18.0 mmHg LVOT Vmax:         95.60 cm/s LVOT Vmean:        65.900 cm/s LVOT VTI:          0.171 m LVOT/AV VTI ratio: 0.39  AORTA Ao Root diam: 3.40 cm Ao Asc diam:  4.05 cm MITRAL VALVE MV Area (PHT): 5.38 cm    SHUNTS MV Decel Time: 141 msec    Systemic VTI:  0.17 m MV E velocity: 78.30 cm/s  Systemic Diam: 2.17 cm MV A velocity: 80.60 cm/s MV E/A ratio:  0.97 Dalton McleanMD Electronically signed by Franki Monte Signature Date/Time: 06/01/2022/3:43:42 PM    Final     Cardiac Studies   Echocardiogram 06/01/2022: Impressions:  1. Left ventricular ejection fraction, by estimation, is 55 to 60%. The  left ventricle has normal function. The left  ventricle has no regional  wall motion abnormalities. Left ventricular diastolic parameters are  consistent with Grade II diastolic  dysfunction (pseudonormalization).   2. Right ventricular systolic function is normal. The right ventricular  size is normal. Tricuspid regurgitation signal is inadequate for assessing  PA pressure.   3. Left atrial size was mildly dilated.   4. Right atrial size was mildly dilated.   5. The mitral valve is normal in structure. Mild mitral valve  regurgitation. No evidence of mitral stenosis.   6. The aortic valve is tricuspid. There is moderate calcification of the  aortic valve. Aortic valve regurgitation is not visualized. Mild to  moderate aortic valve stenosis. Aortic valve area, by VTI measures 1.44  cm. Aortic valve mean gradient measures   18.0 mmHg.   7. Aortic dilatation noted. There is mild dilatation of the ascending  aorta, measuring 41 mm.   8. IVC not well-visualized .     Patient Profile     66 y.o. 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  Assessment & Plan    Dyspnea Possible Acute Diastolic CHF Patient presented with progressive shortness of breath and weakness.  BNP elevated at 462 may be underrepresented given morbid obesity.  Chest x-ray showed chronic cardiomegaly with mild right base opacity with low lung volumes favoring atelectasis.  CTA was negative for PE but did show a new small right pleural effusion with adjacent consolidation as well as an unchanged left pleural effusion.  Echo showed EF of 55-60%  with no regional wall motion abnormalities and grade 2 diastolic dysfunction  as well as mild MR, mild to moderate AS with mean gradient of 18 mmHg, and mild dilation fo the ascending aorta measuring 41 mm. Overall picture is confusing.  He describes worsening shortness of breath and orthopnea but states he has not been gaining weight and they have had trouble removing fluid during dialysis sessions due to hypotension.   - Symptoms improving with dialysis but still having significant shortness of breath with minimal activity. - Continue dialysis per Nephrology.   - Plan is for right/left cardiac catheterization today. INR is 1.7 today. He was consented for procedure yesterday. If cath is unremarkable, plan will be to get PFTs with DLCO to rule out Amiodarone toxicity he has been on this for several years.    Chest Pain Patient presented with chest pain that was somewhat positional and strongly associated with orthopnea. EKG showed no acute ischemic changes and high-sensitivity troponin negative x2. ESR and CRP elevated at 76 and 3.0 respectively. Chest CTA negative for PE. Echo showed normal LV function and wall motion. - He describes some chest tightness this morning after getting more short of breath but overall improved. - Chest pain very atypical and not consistent with ACS or acute pericarditis. However, plan is for right /left cardiac catheterization for further evaluation of chest pain and shortness of breath once INR gets below 1.7.    Paroxysmal Atrial Fibrillation Maintaining sinus rhythm. Rates in the 90s to low 100s. - Continue Amiodarone 260m daily. - On chronic anticoagulation with Coumadin at home. This is currently on hold in anticipation for heart cath. INR 1.7. Now on IV Heparin.   Hypertension BP mostly well controlled. He has had problems with BP dropping to low during dialysis requiring Midodrine. - Not on any antihypertensives. - Continue to monitor.     Hyperlipidemia Continue Lipitor 431mdaily.   ESRD On hemodialysis T/Th/Sat. - Management per Nephrology.   Otherwise, per primary team: - Community acquired pneumonia  - Metastatic renal cell carcinoma - Type 2 diabetes mellitus   For questions or updates, please contact [facility] consult [email or web address] for contact info under        Signed, CaDarreld McleanPA-C  06/03/2022, 7:31 AM

## 2022-06-03 NOTE — H&P (View-Only) (Signed)
Rounding Note    Patient Name: George Lewis. Date of Encounter: 06/03/2022  [place] Cardiologist: Elouise Munroe, MD   Subjective   No acute overnight events. He reports improvement in his breathing but is still having significant shortness of breath with minimal activity. After walking back from the bathroom this morning, he had to put on his CPAP machine because he felt very short of breath. He reports some chest tightness but describes this as just the feeling like he breathing through a straw. Pleuritic pain has resolved.   Inpatient Medications    Scheduled Meds:  allopurinol  100 mg Oral QPM   amiodarone  200 mg Oral QPM   atorvastatin  40 mg Oral QPM   Chlorhexidine Gluconate Cloth  6 each Topical Q0600   Chlorhexidine Gluconate Cloth  6 each Topical Q0600   loratadine  10 mg Oral QPM   Continuous Infusions:  cefTRIAXone (ROCEPHIN)  IV Stopped (06/02/22 1354)   heparin     PRN Meds: acetaminophen **OR** acetaminophen, albuterol, fluticasone, guaiFENesin, prochlorperazine, traMADol   Vital Signs    Vitals:   06/02/22 1700 06/02/22 1955 06/02/22 2324 06/03/22 0423  BP:  117/69  114/69  Pulse: (!) 111 (!) 101 91   Resp:  (!) 22 20   Temp:  98.8 F (37.1 C)  98.1 F (36.7 C)  TempSrc:  Oral  Oral  SpO2: 96%  94% 96%  Weight:    125.7 kg  Height:        Intake/Output Summary (Last 24 hours) at 06/03/2022 0731 Last data filed at 06/02/2022 1525 Gross per 24 hour  Intake 265.52 ml  Output 2500 ml  Net -2234.48 ml      06/03/2022    4:23 AM 06/02/2022    1:10 PM 06/02/2022   12:43 PM  Last 3 Weights  Weight (lbs) 277 lb 1.9 oz 278 lb 278 lb  Weight (kg) 125.7 kg 126.1 kg 126.1 kg      Telemetry    Normal sinus rhythm with rates mostly in the 80s to 90s. - Personally Reviewed  ECG    No new ECG tracing today. - Personally Reviewed  Physical Exam   GEN: Morbidly obese Caucasian male in no acute distress.   Neck: JVD  difficult to assess due to body habitus. Cardiac: Tachycardic with normal rhythm. Possible very soft murmur noted.  Respiratory: No increased work of breathing. Mild crackles noted in right base but otherwise long clear. GI: Soft, non-distended, and non-tender. MS: No lower extremity edema. No deformity. Skin: Warm and dry. Neuro:  No focal deficits. Psych: Normal affect. Responds appropriately.  Labs    High Sensitivity Troponin:   Recent Labs  Lab 05/31/22 1245 05/31/22 1516  TROPONINIHS 9 11     Chemistry Recent Labs  Lab 06/01/22 0109 06/02/22 0111 06/03/22 0041  NA 133* 133* 136  K 4.6 3.7 3.5  CL 101 100 95*  CO2 16* 19* 24  GLUCOSE 83 108* 94  BUN 57* 59* 45*  CREATININE 10.36* 10.00* 8.03*  CALCIUM 7.8* 7.9* 8.7*  MG  --  1.6* 2.2  PROT 6.8  --   --   ALBUMIN 3.2* 3.2* 3.3*  AST 17  --   --   ALT 11  --   --   ALKPHOS 37*  --   --   BILITOT 0.5  --   --   GFRNONAA 5* 5* 7*  ANIONGAP 16* 14 17*  Lipids No results for input(s): "CHOL", "TRIG", "HDL", "LABVLDL", "LDLCALC", "CHOLHDL" in the last 168 hours.  Hematology Recent Labs  Lab 06/01/22 0109 06/02/22 0111 06/03/22 0041  WBC 9.3 8.4 8.4  RBC 3.33* 3.38* 3.77*  HGB 10.5* 10.9* 11.8*  HCT 31.4* 30.8* 34.6*  MCV 94.3 91.1 91.8  MCH 31.5 32.2 31.3  MCHC 33.4 35.4 34.1  RDW 14.4 14.5 14.3  PLT 179 222 251   Thyroid No results for input(s): "TSH", "FREET4" in the last 168 hours.  BNP Recent Labs  Lab 05/31/22 0606  BNP 462.7*    DDimer No results for input(s): "DDIMER" in the last 168 hours.   Radiology    DG Chest 2 View  Result Date: 06/02/2022 CLINICAL DATA:  Shortness of breath EXAM: CHEST - 2 VIEW COMPARISON:  05/31/2022 FINDINGS: Stable cardiomegaly. Low lung volumes. Trace left-sided pleural effusion with associated left basilar opacity. No pneumothorax. IMPRESSION: Stable radiographic appearance of the chest with trace left-sided pleural effusion and associated left basilar  opacity. Electronically Signed   By: Davina Poke D.O.   On: 06/02/2022 15:34   ECHOCARDIOGRAM COMPLETE  Result Date: 06/01/2022    ECHOCARDIOGRAM REPORT   Patient Name:   George Lewis. Date of Exam: 06/01/2022 Medical Rec #:  [id number]             Height:       73.0 in Accession #:    [id number]            Weight:       293.4 lb Date of Birth:  06/05/1956             BSA:          2.532 m Patient Age:    82 years              BP:           141/78 mmHg Patient Gender: M                     HR:           95 bpm. Exam Location:  Inpatient Procedure: 2D Echo, Cardiac Doppler, Color Doppler and Intracardiac            Opacification Agent Indications:    Other abnormalities of the heart R00.8  History:        Patient has prior history of Echocardiogram examinations, most                 recent 11/26/2018. Arrythmias:Atrial Fibrillation,                 Signs/Symptoms:Shortness of Breath and Chest Pain; Risk                 Factors:Dyslipidemia, Diabetes and Hypertension.  Sonographer:    Greer Pickerel Referring Phys: [id number] Jonnie Finner  Sonographer Comments: Technically challenging study due to limited acoustic windows, Technically difficult study due to poor echo windows, suboptimal apical window, no subcostal window and patient is obese. Image acquisition challenging due to respiratory motion. IMPRESSIONS  1. Left ventricular ejection fraction, by estimation, is 55 to 60%. The left ventricle has normal function. The left ventricle has no regional wall motion abnormalities. Left ventricular diastolic parameters are consistent with Grade II diastolic dysfunction (pseudonormalization).  2. Right ventricular systolic function is normal. The right ventricular size is normal. Tricuspid regurgitation signal is inadequate for assessing PA pressure.  3. Left atrial size was  mildly dilated.  4. Right atrial size was mildly dilated.  5. The mitral valve is normal in structure. Mild mitral valve regurgitation. No  evidence of mitral stenosis.  6. The aortic valve is tricuspid. There is moderate calcification of the aortic valve. Aortic valve regurgitation is not visualized. Mild to moderate aortic valve stenosis. Aortic valve area, by VTI measures 1.44 cm. Aortic valve mean gradient measures  18.0 mmHg.  7. Aortic dilatation noted. There is mild dilatation of the ascending aorta, measuring 41 mm.  8. IVC not well-visualized . FINDINGS  Left Ventricle: Left ventricular ejection fraction, by estimation, is 55 to 60%. The left ventricle has normal function. The left ventricle has no regional wall motion abnormalities. Definity contrast agent was given IV to delineate the left ventricular  endocardial borders. The left ventricular internal cavity size was normal in size. There is no left ventricular hypertrophy. Left ventricular diastolic parameters are consistent with Grade II diastolic dysfunction (pseudonormalization). Right Ventricle: The right ventricular size is normal. No increase in right ventricular wall thickness. Right ventricular systolic function is normal. Tricuspid regurgitation signal is inadequate for assessing PA pressure. Left Atrium: Left atrial size was mildly dilated. Right Atrium: Right atrial size was mildly dilated. Pericardium: There is no evidence of pericardial effusion. Mitral Valve: The mitral valve is normal in structure. There is mild calcification of the mitral valve leaflet(s). Mild mitral annular calcification. Mild mitral valve regurgitation. No evidence of mitral valve stenosis. Tricuspid Valve: The tricuspid valve is normal in structure. Tricuspid valve regurgitation is not demonstrated. Aortic Valve: The aortic valve is tricuspid. There is moderate calcification of the aortic valve. Aortic valve regurgitation is not visualized. Mild to moderate aortic stenosis is present. Aortic valve mean gradient measures 18.0 mmHg. Aortic valve peak gradient measures 29.1 mmHg. Aortic valve area, by VTI  measures 1.44 cm. Pulmonic Valve: The pulmonic valve was normal in structure. Pulmonic valve regurgitation is not visualized. Aorta: The aortic root is normal in size and structure and aortic dilatation noted. There is mild dilatation of the ascending aorta, measuring 41 mm. Venous: The inferior vena cava was not well visualized. IAS/Shunts: No atrial level shunt detected by color flow Doppler.  LEFT VENTRICLE PLAX 2D LVIDd:         5.40 cm   Diastology LVIDs:         3.80 cm   LV e' medial:    7.51 cm/s LV PW:         1.50 cm   LV E/e' medial:  10.4 LV IVS:        1.10 cm   LV e' lateral:   6.42 cm/s LVOT diam:     2.17 cm   LV E/e' lateral: 12.2 LV SV:         63 LV SV Index:   25 LVOT Area:     3.70 cm  RIGHT VENTRICLE RV S prime:     11.00 cm/s TAPSE (M-mode): 2.0 cm LEFT ATRIUM             Index        RIGHT ATRIUM           Index LA diam:        4.60 cm 1.82 cm/m   RA Area:     39.50 cm LA Vol (A2C):   63.6 ml 25.12 ml/m  RA Volume:   144.00 ml 56.86 ml/m LA Vol (A4C):   76.9 ml 30.37 ml/m LA Biplane Vol: 74.0 ml  29.22 ml/m  AORTIC VALVE AV Area (Vmax):    1.31 cm AV Area (Vmean):   1.27 cm AV Area (VTI):     1.44 cm AV Vmax:           269.50 cm/s AV Vmean:          192.500 cm/s AV VTI:            0.440 m AV Peak Grad:      29.1 mmHg AV Mean Grad:      18.0 mmHg LVOT Vmax:         95.60 cm/s LVOT Vmean:        65.900 cm/s LVOT VTI:          0.171 m LVOT/AV VTI ratio: 0.39  AORTA Ao Root diam: 3.40 cm Ao Asc diam:  4.05 cm MITRAL VALVE MV Area (PHT): 5.38 cm    SHUNTS MV Decel Time: 141 msec    Systemic VTI:  0.17 m MV E velocity: 78.30 cm/s  Systemic Diam: 2.17 cm MV A velocity: 80.60 cm/s MV E/A ratio:  0.97 Dalton McleanMD Electronically signed by Franki Monte Signature Date/Time: 06/01/2022/3:43:42 PM    Final     Cardiac Studies   Echocardiogram 06/01/2022: Impressions:  1. Left ventricular ejection fraction, by estimation, is 55 to 60%. The  left ventricle has normal function. The left  ventricle has no regional  wall motion abnormalities. Left ventricular diastolic parameters are  consistent with Grade II diastolic  dysfunction (pseudonormalization).   2. Right ventricular systolic function is normal. The right ventricular  size is normal. Tricuspid regurgitation signal is inadequate for assessing  PA pressure.   3. Left atrial size was mildly dilated.   4. Right atrial size was mildly dilated.   5. The mitral valve is normal in structure. Mild mitral valve  regurgitation. No evidence of mitral stenosis.   6. The aortic valve is tricuspid. There is moderate calcification of the  aortic valve. Aortic valve regurgitation is not visualized. Mild to  moderate aortic valve stenosis. Aortic valve area, by VTI measures 1.44  cm. Aortic valve mean gradient measures   18.0 mmHg.   7. Aortic dilatation noted. There is mild dilatation of the ascending  aorta, measuring 41 mm.   8. IVC not well-visualized .     Patient Profile     66 y.o. 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  Assessment & Plan    Dyspnea Possible Acute Diastolic CHF Patient presented with progressive shortness of breath and weakness.  BNP elevated at 462 may be underrepresented given morbid obesity.  Chest x-ray showed chronic cardiomegaly with mild right base opacity with low lung volumes favoring atelectasis.  CTA was negative for PE but did show a new small right pleural effusion with adjacent consolidation as well as an unchanged left pleural effusion.  Echo showed EF of 55-60%  with no regional wall motion abnormalities and grade 2 diastolic dysfunction  as well as mild MR, mild to moderate AS with mean gradient of 18 mmHg, and mild dilation fo the ascending aorta measuring 41 mm. Overall picture is confusing.  He describes worsening shortness of breath and orthopnea but states he has not been gaining weight and they have had trouble removing fluid during dialysis sessions due to hypotension.   - Symptoms improving with dialysis but still having significant shortness of breath with minimal activity. - Continue dialysis per Nephrology.   - Plan is for right/left cardiac catheterization today. INR is 1.7 today. He was consented for procedure yesterday. If cath is unremarkable, plan will be to get PFTs with DLCO to rule out Amiodarone toxicity he has been on this for several years.    Chest Pain Patient presented with chest pain that was somewhat positional and strongly associated with orthopnea. EKG showed no acute ischemic changes and high-sensitivity troponin negative x2. ESR and CRP elevated at 76 and 3.0 respectively. Chest CTA negative for PE. Echo showed normal LV function and wall motion. - He describes some chest tightness this morning after getting more short of breath but overall improved. - Chest pain very atypical and not consistent with ACS or acute pericarditis. However, plan is for right /left cardiac catheterization for further evaluation of chest pain and shortness of breath once INR gets below 1.7.    Paroxysmal Atrial Fibrillation Maintaining sinus rhythm. Rates in the 90s to low 100s. - Continue Amiodarone 260m daily. - On chronic anticoagulation with Coumadin at home. This is currently on hold in anticipation for heart cath. INR 1.7. Now on IV Heparin.   Hypertension BP mostly well controlled. He has had problems with BP dropping to low during dialysis requiring Midodrine. - Not on any antihypertensives. - Continue to monitor.     Hyperlipidemia Continue Lipitor 431mdaily.   ESRD On hemodialysis T/Th/Sat. - Management per Nephrology.   Otherwise, per primary team: - Community acquired pneumonia  - Metastatic renal cell carcinoma - Type 2 diabetes mellitus   For questions or updates, please contact [place] consult [email or web address] for contact info under        Signed, CaDarreld McleanPA-C  06/03/2022, 7:31 AM

## 2022-06-03 NOTE — Progress Notes (Signed)
[name] Progress Note  Subjective: down to 126kg post HD yest, dry wt was 131kg prior to admission. Pt still having DOE, but feels better overall. Going for LHC today.   Vitals:   06/02/22 1955 06/02/22 2324 06/03/22 0423 06/03/22 0741  BP: 117/69  114/69 138/81  Pulse: (!) 101 91  88  Resp: (!) 22 20  (!) 29  Temp: 98.8 F (37.1 C)  98.1 F (36.7 C) (!) 97.5 F (36.4 C)  TempSrc: Oral  Oral Oral  SpO2:  94% 96% 95%  Weight:   125.7 kg   Height:        Exam: Gen alert, no distress No jvd or bruits Chest clear bilat to bases RRR no MRG Abd soft ntnd no mass or ascites +bs Ext no LE edema Neuro is alert, Ox 3 , nf    LUA AVF+bruit    Home meds include - allopurinol, amiodarone, arovastatin, axitinib, omeprazole, pembrolizumab, tramadol, warfarin, gabapentin, insulin glargine, midodrine 10mg  preHD tts, prns/ vits/ supps    OP HD: TTS [facility] 4h 450/ 600   130.9kg  3K/2.5bath  P2  Hep none  LUA AVF - last HD 9/30, post wt 131.2kg - rocaltrol 0.5 mcg tiw po - last Hb 11.4, no esa     CXR - poor film, IMPRESSION: 1. Mild right base opacity with low lung volumes favoring atelectasis. 2. Chronic cardiomegaly.    BP 155/ 75, HR 80-90, RR 17- 25, afeb, 4L      CTA chest - no PE, mild bilat effusions w/ mild adjacent bilat atx vs consolidation   Assessment/ Plan: Chest pain - trops negative, EKG unremarkable. CTA no PE. Possibly due to vol overload. Cardiology planning LHC as well to r/o CAD.  SOB - CXR w/ vasc congestion. Is now 4-5 under the prior dry wt, but pt still SOB w/ exertion. Per above cardiology to do LHC today, r/o CAD. Echo showed ok EF.  ESRD - on HD TTS. HD tomorrow.  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  MBD ckd - CCa in range, cont vdra po w/ HD DM2 - on insulin, per pmd      Rob Chariah Bailey 06/03/2022, 9:57 AM   Recent Labs  Lab 06/02/22 0111 06/03/22 0041  HGB 10.9* 11.8*   ALBUMIN 3.2* 3.3*  CALCIUM 7.9* 8.7*  PHOS 5.8* 6.0*  CREATININE 10.00* 8.03*  K 3.7 3.5    No results for input(s): "IRON", "TIBC", "FERRITIN" in the last 168 hours. Inpatient medications:  allopurinol  100 mg Oral QPM   amiodarone  200 mg Oral QPM   atorvastatin  40 mg Oral QPM   Chlorhexidine Gluconate Cloth  6 each Topical Q0600   Chlorhexidine Gluconate Cloth  6 each Topical Q0600   loratadine  10 mg Oral QPM    cefTRIAXone (ROCEPHIN)  IV Stopped (06/02/22 1354)   heparin 1,500 Units/hr (06/03/22 0802)   acetaminophen **OR** acetaminophen, albuterol, fluticasone, guaiFENesin, prochlorperazine, traMADol

## 2022-06-03 NOTE — Consult Note (Signed)
   [facility] CM Inpatient Consult   06/03/2022  George Lewis 1956/03/04 [id number]  [place] [facility] [ACO] Patient: Medicare ACO REACH  Primary Care Provider:  Ellouise Newer, [facility] noted   Patient screened for hospitalization with noted exreme high risk score for unplanned readmission risk and to assess for potential [place] Management service needs for post hospital transition.  Review of patient's medical record reveals patient is for HD and possible cath today.  He was working with staff on rounds.   Plan:  Continue to follow progress and disposition to assess for post hospital care management needs.    For questions contact:   Natividad Brood, RN BSN [facility] Liaison  [phone] business mobile phone Toll free office [phone]  Fax number: [phone] [email or web address]

## 2022-06-03 NOTE — Progress Notes (Signed)
ANTICOAGULATION CONSULT NOTE - Follow Up Consult  Pharmacy Consult for warfarin  Indication: atrial fibrillation  Allergies  Allergen Reactions   Penicillins Rash and Other (See Comments)    Has patient had a PCN reaction causing immediate rash, facial/tongue/throat swelling, SOB or lightheadedness with hypotension: yes Has patient had a PCN reaction causing severe rash involving mucus membranes or skin necrosis: no Has patient had a PCN reaction that required hospitalization: no Has patient had a PCN reaction occurring within the last 10 years: no If all of the above answers are "NO", then may proceed with Cephalosporin use.     Patient Measurements: Height: 6\' 1"  (185.4 cm) Weight: 125.7 kg (277 lb 1.9 oz) IBW/kg (Calculated) : 79.9 Heparin Dosing Weight: 109 kg   Vital Signs: Temp: 98 F (36.7 C) (10/06 1742) Temp Source: Oral (10/06 1742) BP: 176/68 (10/06 1742) Pulse Rate: 102 (10/06 1742)  Labs: Recent Labs    06/01/22 0109 06/02/22 0111 06/03/22 0041 06/03/22 1550 06/03/22 1602  HGB 10.5* 10.9* 11.8* 11.9* 11.6*  HCT 31.4* 30.8* 34.6* 35.0* 34.0*  PLT 179 222 251  --   --   LABPROT 26.3* 23.0* 19.6*  --   --   INR 2.4* 2.1* 1.7*  --   --   CREATININE 10.36* 10.00* 8.03*  --   --      Estimated Creatinine Clearance: 13.1 mL/min (A) (by C-G formula based on SCr of 8.03 mg/dL (H)).   Medications:  Scheduled:   allopurinol  100 mg Oral QPM   amiodarone  200 mg Oral QPM   aspirin  81 mg Oral Daily   atorvastatin  40 mg Oral QPM   [START ON 06/04/2022] calcitRIOL  0.5 mcg Oral Q T,Th,Sa-HD   Chlorhexidine Gluconate Cloth  6 each Topical Q0600   Chlorhexidine Gluconate Cloth  6 each Topical Q0600   [START ON 06/04/2022] Chlorhexidine Gluconate Cloth  6 each Topical Q0600   [START ON 06/04/2022] clopidogrel  75 mg Oral Q breakfast   ferric citrate  210 mg Oral TID WC   loratadine  10 mg Oral QPM   sodium chloride flush  3 mL Intravenous Q12H     Assessment: Pt is a 43 yoM with PMH significant for RCC, afib (on warfarin), ESRD on HD TTS, T2DM. Pt admitted with SOB - concern for pericarditis, PNA. Pharmacy consulted to dose warfarin. Home dose: 3mg  daily except for 1.5mg  on Mon and Fri  INR today 1.7. heparin bridge started pre-cath. Pt s/p cath with stent to prox-mid LAD. Discussed with Dr. Ali Lowe - okay for no bridge post cath and may restart warfarin tonight.  Goal of Therapy:  INR 2-3 Monitor platelets by anticoagulation protocol: Yes   Plan:  Daily INR Warfarin 3mg  tonight  Sherlon Handing, PharmD, BCPS Please see amion for complete clinical pharmacist phone list 06/03/2022,5:51 PM

## 2022-06-03 NOTE — Care Management Important Message (Signed)
Important Message  Patient Details  Name: George Lewis. MRN: [id number] Date of Birth: 05/19/1956   Medicare Important Message Given:  Yes     Shelda Altes 06/03/2022, 9:34 AM

## 2022-06-03 NOTE — Interval H&P Note (Signed)
History and Physical Interval Note:  06/03/2022 3:38 PM  George Lewis.  has presented today for surgery, with the diagnosis of hf.  The various methods of treatment have been discussed with the patient and family. After consideration of risks, benefits and other options for treatment, the patient has consented to  Procedure(s): RIGHT/LEFT HEART CATH AND CORONARY ANGIOGRAPHY (N/A) as a surgical intervention.  The patient's history has been reviewed, patient examined, no change in status, stable for surgery.  I have reviewed the patient's chart and labs.  Questions were answered to the patient's satisfaction.     Early Osmond

## 2022-06-03 NOTE — Progress Notes (Signed)
Pt already on CPAP when RT entered room. Pt resting comfortably and VS stable.

## 2022-06-03 NOTE — Plan of Care (Signed)
  Problem: Education: Goal: Knowledge of General Education information will improve Description: Including pain rating scale, medication(s)/side effects and non-pharmacologic comfort measures Outcome: Progressing   Problem: Health Behavior/Discharge Planning: Goal: Ability to manage health-related needs will improve Outcome: Progressing   Problem: Clinical Measurements: Goal: Ability to maintain clinical measurements within normal limits will improve Outcome: Progressing Goal: Will remain free from infection Outcome: Progressing Goal: Diagnostic test results will improve Outcome: Progressing Goal: Respiratory complications will improve Outcome: Progressing Goal: Cardiovascular complication will be avoided Outcome: Progressing   Problem: Activity: Goal: Risk for activity intolerance will decrease Outcome: Progressing   Problem: Nutrition: Goal: Adequate nutrition will be maintained Outcome: Progressing   Problem: Coping: Goal: Level of anxiety will decrease Outcome: Progressing   Problem: Elimination: Goal: Will not experience complications related to bowel motility Outcome: Progressing Goal: Will not experience complications related to urinary retention Outcome: Progressing   Problem: Pain Managment: Goal: General experience of comfort will improve Outcome: Progressing   Problem: Safety: Goal: Ability to remain free from injury will improve Outcome: Progressing   Problem: Skin Integrity: Goal: Risk for impaired skin integrity will decrease Outcome: Progressing   Problem: Activity: Goal: Ability to tolerate increased activity will improve Outcome: Progressing   Problem: Clinical Measurements: Goal: Ability to maintain a body temperature in the normal range will improve Outcome: Progressing   Problem: Respiratory: Goal: Ability to maintain adequate ventilation will improve Outcome: Progressing Goal: Ability to maintain a clear airway will improve Outcome:  Progressing   Problem: Education: Goal: Understanding of CV disease, CV risk reduction, and recovery process will improve Outcome: Progressing Goal: Individualized Educational Video(s) Outcome: Progressing   Problem: Activity: Goal: Ability to return to baseline activity level will improve Outcome: Progressing   Problem: Cardiovascular: Goal: Ability to achieve and maintain adequate cardiovascular perfusion will improve Outcome: Progressing Goal: Vascular access site(s) Level 0-1 will be maintained Outcome: Progressing   Problem: Health Behavior/Discharge Planning: Goal: Ability to safely manage health-related needs after discharge will improve Outcome: Progressing

## 2022-06-03 NOTE — Progress Notes (Signed)
Progress Note Patient: George Lewis. GBT:[id number] DOB: 1956-01-23 DOA: 05/31/2022  DOS: the patient was seen and examined on 06/03/2022  Brief hospital course: George Lewis. is a 66 y.o. male with medical history significant of RCC, A fib on coumadin, HLD, ESRD on HD TTS, DM2.  Presented to the hospital with complaints of progressive worsening shortness of breath ongoing for last 7 days after starting to take new medication for his RCC INLYTA. Cardiology and nephrology consulted. Underwent cardiac catheterization on 10/6.  Assessment and Plan: Acute on chronic diastolic CHF. Paroxysmal A-fib with RVR. Small right pleural effusion with atelectasis. Patient presents with complaints of shortness of breath.  This started immediately after him starting INLYTA. Symptoms progressively worsen without any swelling of the legs or weight gain to the point that he was not able to go to hemodialysis at which point he was brought to the ER. Treated with HD with improvement in symptoms. Echocardiogram shows preserved EF.  No pericardial effusion.  No significant valvular or wall motion abnormalities as well. On exam has faint basilar crackles more on the right side. For now continue volume removal with HD. Patient appears to have intermittent RVR with heart rate ranging from 105 to 130 at the time of my evaluation. On amiodarone.  For now continue.  That can be amiodarone toxicity which can also cause shortness of breath.  Continue anticoagulation. Management per cardiology.   Chest pain. CAD, multivessel There was some concern for pleuritic chest pain which might be due to pericarditis. Started on colchicine although discontinued now as less likely pericarditis. Underwent cardiac catheterization on 10/6.  IFR positive proximal to mid LAD lesion treated with 1 DES. Dual antibiotic therapy preferably for 1 year. Will be on aspirin and Plavix. Management per cardiology.   Concern for  community-acquired pneumonia-ruled out Oncology does not think the patient has Keytruda induced pneumonitis. Discontinue antibiotics.   Type 2 diabetes mellitus, diet controlled with renal complication. Well-controlled 5.3 hemoglobin A1c. Continue current modified diet.   RCC. Follows up with Dr. Alen Lewis. Monitor.  Currently holding off on Inlyta.   HLD. Continue statin.   ESRD on HD on TTS schedule. Nephrology consult for HD Appreciate assistance. Transfer from [place] to [facility] for HD needs.   Obesity Body mass index is 36.56 kg/m.  Placing the pt at higher risk of poor outcomes.  OSA on CPAP. Continue CPAP nightly per home regimen.  Subjective: No chest pain.  No shortness of breath or no fever no chills.  Physical Exam: Vitals:   06/03/22 0802 06/03/22 1123 06/03/22 1509 06/03/22 1742  BP:  121/68  (!) 176/68  Pulse: 100 96  (!) 102  Resp: 20 19    Temp:  98.5 F (36.9 C)  98 F (36.7 C)  TempSrc:  Oral  Oral  SpO2: 99% 96% 96% 94%  Weight:      Height:       General: Appear in mild distress; no visible Abnormal Neck Mass Or lumps, Conjunctiva normal Cardiovascular: S1 and S2 Present, no Murmur, Respiratory: good respiratory effort, Bilateral Air entry present and CTA, no Crackles, no wheezes Abdomen: Bowel Sound present, Non tender  Extremities: no Pedal edema Neurology: alert and oriented to time, place, and person  Gait not checked due to patient safety concerns   Data Reviewed: I have Reviewed nursing notes, Vitals, and Lab results since pt's last encounter. Pertinent lab results CBC and BMP I have ordered test including CBC and BMP  Family Communication: Wife at bedside  Disposition: Status is: Inpatient Remains inpatient appropriate because: Monitor postprocedure recovery.  Author: Berle Mull, MD 06/03/2022 6:27 PM  Please look on [email or web address] to find out who is on call.

## 2022-06-03 NOTE — Progress Notes (Addendum)
ANTICOAGULATION CONSULT NOTE - Follow Up Consult  Pharmacy Consult for heparin/warfarin  Indication: atrial fibrillation  Allergies  Allergen Reactions   Penicillins Rash and Other (See Comments)    Has patient had a PCN reaction causing immediate rash, facial/tongue/throat swelling, SOB or lightheadedness with hypotension: yes Has patient had a PCN reaction causing severe rash involving mucus membranes or skin necrosis: no Has patient had a PCN reaction that required hospitalization: no Has patient had a PCN reaction occurring within the last 10 years: no If all of the above answers are "NO", then may proceed with Cephalosporin use.     Patient Measurements: Height: 6\' 1"  (185.4 cm) Weight: 125.7 kg (277 lb 1.9 oz) IBW/kg (Calculated) : 79.9 Heparin Dosing Weight: 109 kg   Vital Signs: Temp: 98.1 F (36.7 C) (10/06 0423) Temp Source: Oral (10/06 0423) BP: 114/69 (10/06 0423) Pulse Rate: 91 (10/05 2324)  Labs: Recent Labs    05/31/22 1245 05/31/22 1516 06/01/22 0109 06/02/22 0111 06/03/22 0041  HGB  --   --  10.5* 10.9* 11.8*  HCT  --   --  31.4* 30.8* 34.6*  PLT  --   --  179 222 251  LABPROT 32.8*  --  26.3* 23.0* 19.6*  INR 3.2*  --  2.4* 2.1* 1.7*  CREATININE  --   --  10.36* 10.00* 8.03*  TROPONINIHS 9 11  --   --   --     Estimated Creatinine Clearance: 13.1 mL/min (A) (by C-G formula based on SCr of 8.03 mg/dL (H)).   Medications:  Scheduled:   allopurinol  100 mg Oral QPM   amiodarone  200 mg Oral QPM   atorvastatin  40 mg Oral QPM   Chlorhexidine Gluconate Cloth  6 each Topical Q0600   Chlorhexidine Gluconate Cloth  6 each Topical Q0600   loratadine  10 mg Oral QPM    Assessment: Pt is a 25 yoM with PMH significant for RCC, afib (on warfarin), ESRD on HD TTS, T2DM. Pt admitted with SOB - concern for pericarditis, PNA. Pharmacy consulted to dose warfarin and heparin.   INR yesterday was 2.1, INR today 1.7. Will start heparin this morning as INR  has dropped below 2.0. HgB stable and no overt bleeding noted. Patient ESRD, minimal urine output -434mL.   Goal of Therapy:  Heparin level 0.3-0.7 units/ml Monitor platelets by anticoagulation protocol: Yes   Plan:  No bolus as patient has recently received therapeutic anticoagulation and likely to undergo cath.  Start heparin infusion at 1500 units/hr (14u/kg)  Check anti-Xa level in 8 hours and daily while on heparin Continue to monitor H&H and platelets  Ventura Sellers 06/03/2022,7:07 AM

## 2022-06-04 DIAGNOSIS — R079 Chest pain, unspecified: Secondary | ICD-10-CM

## 2022-06-04 DIAGNOSIS — I480 Paroxysmal atrial fibrillation: Secondary | ICD-10-CM | POA: Diagnosis not present

## 2022-06-04 DIAGNOSIS — I10 Essential (primary) hypertension: Secondary | ICD-10-CM | POA: Diagnosis not present

## 2022-06-04 DIAGNOSIS — N186 End stage renal disease: Secondary | ICD-10-CM | POA: Diagnosis not present

## 2022-06-04 DIAGNOSIS — I5031 Acute diastolic (congestive) heart failure: Secondary | ICD-10-CM

## 2022-06-04 LAB — RENAL FUNCTION PANEL
Albumin: 3.2 g/dL — ABNORMAL LOW (ref 3.5–5.0)
Anion gap: 20 — ABNORMAL HIGH (ref 5–15)
BUN: 60 mg/dL — ABNORMAL HIGH (ref 8–23)
CO2: 20 mmol/L — ABNORMAL LOW (ref 22–32)
Calcium: 8.2 mg/dL — ABNORMAL LOW (ref 8.9–10.3)
Chloride: 93 mmol/L — ABNORMAL LOW (ref 98–111)
Creatinine, Ser: 10.75 mg/dL — ABNORMAL HIGH (ref 0.61–1.24)
GFR, Estimated: 5 mL/min — ABNORMAL LOW (ref >60)
Glucose, Bld: 92 mg/dL (ref 70–99)
Phosphorus: 8.3 mg/dL — ABNORMAL HIGH (ref 2.5–4.6)
Potassium: 3.7 mmol/L (ref 3.5–5.1)
Sodium: 133 mmol/L — ABNORMAL LOW (ref 135–145)

## 2022-06-04 LAB — CBC
HCT: 31.8 % — ABNORMAL LOW (ref 39.0–52.0)
Hemoglobin: 10.8 g/dL — ABNORMAL LOW (ref 13.0–17.0)
MCH: 31.4 pg (ref 26.0–34.0)
MCHC: 34 g/dL (ref 30.0–36.0)
MCV: 92.4 fL (ref 80.0–100.0)
Platelets: 258 10*3/uL (ref 150–400)
RBC: 3.44 MIL/uL — ABNORMAL LOW (ref 4.22–5.81)
RDW: 14.5 % (ref 11.5–15.5)
WBC: 8.4 10*3/uL (ref 4.0–10.5)
nRBC: 0 % (ref 0.0–0.2)

## 2022-06-04 LAB — PROTIME-INR
INR: 1.5 — ABNORMAL HIGH (ref 0.8–1.2)
Prothrombin Time: 17.5 seconds — ABNORMAL HIGH (ref 11.4–15.2)

## 2022-06-04 LAB — MAGNESIUM: Magnesium: 2.3 mg/dL (ref 1.7–2.4)

## 2022-06-04 MED ORDER — METOPROLOL SUCCINATE ER 25 MG PO TB24
25.0000 mg | ORAL_TABLET | Freq: Every day | ORAL | Status: DC
  Administered 2022-06-04 – 2022-06-06 (×3): 25 mg via ORAL
  Filled 2022-06-04 (×3): qty 1

## 2022-06-04 MED ORDER — WARFARIN SODIUM 2 MG PO TABS
3.0000 mg | ORAL_TABLET | Freq: Once | ORAL | Status: AC
  Administered 2022-06-04: 3 mg via ORAL
  Filled 2022-06-04: qty 1

## 2022-06-04 MED ORDER — ATORVASTATIN CALCIUM 80 MG PO TABS
80.0000 mg | ORAL_TABLET | Freq: Every evening | ORAL | Status: DC
  Administered 2022-06-04 – 2022-06-05 (×2): 80 mg via ORAL
  Filled 2022-06-04 (×2): qty 1

## 2022-06-04 NOTE — Plan of Care (Signed)
Problem: Education: Goal: Knowledge of General Education information will improve Description: Including pain rating scale, medication(s)/side effects and non-pharmacologic comfort measures 06/04/2022 0534 by Charletta Cousin, RN Outcome: Progressing 06/04/2022 0534 by Charletta Cousin, RN Outcome: Progressing   Problem: Health Behavior/Discharge Planning: Goal: Ability to manage health-related needs will improve 06/04/2022 0534 by Charletta Cousin, RN Outcome: Progressing 06/04/2022 0534 by Charletta Cousin, RN Outcome: Progressing   Problem: Clinical Measurements: Goal: Ability to maintain clinical measurements within normal limits will improve 06/04/2022 0534 by Charletta Cousin, RN Outcome: Progressing 06/04/2022 0534 by Charletta Cousin, RN Outcome: Progressing Goal: Will remain free from infection 06/04/2022 0534 by Charletta Cousin, RN Outcome: Progressing 06/04/2022 0534 by Charletta Cousin, RN Outcome: Progressing Goal: Diagnostic test results will improve 06/04/2022 0534 by Charletta Cousin, RN Outcome: Progressing 06/04/2022 0534 by Charletta Cousin, RN Outcome: Progressing Goal: Respiratory complications will improve 06/04/2022 0534 by Charletta Cousin, RN Outcome: Progressing 06/04/2022 0534 by Charletta Cousin, RN Outcome: Progressing Goal: Cardiovascular complication will be avoided 06/04/2022 0534 by Charletta Cousin, RN Outcome: Progressing 06/04/2022 0534 by Charletta Cousin, RN Outcome: Progressing   Problem: Activity: Goal: Risk for activity intolerance will decrease 06/04/2022 0534 by Charletta Cousin, RN Outcome: Progressing 06/04/2022 0534 by Charletta Cousin, RN Outcome: Progressing   Problem: Nutrition: Goal: Adequate nutrition will be maintained 06/04/2022 0534 by Charletta Cousin, RN Outcome: Progressing 06/04/2022 0534 by Charletta Cousin, RN Outcome: Progressing   Problem: Coping: Goal: Level of anxiety will decrease 06/04/2022 0534 by Charletta Cousin, RN Outcome:  Progressing 06/04/2022 0534 by Charletta Cousin, RN Outcome: Progressing   Problem: Elimination: Goal: Will not experience complications related to bowel motility 06/04/2022 0534 by Charletta Cousin, RN Outcome: Progressing 06/04/2022 0534 by Charletta Cousin, RN Outcome: Progressing Goal: Will not experience complications related to urinary retention 06/04/2022 0534 by Charletta Cousin, RN Outcome: Progressing 06/04/2022 0534 by Charletta Cousin, RN Outcome: Progressing   Problem: Pain Managment: Goal: General experience of comfort will improve 06/04/2022 0534 by Charletta Cousin, RN Outcome: Progressing 06/04/2022 0534 by Charletta Cousin, RN Outcome: Progressing   Problem: Safety: Goal: Ability to remain free from injury will improve 06/04/2022 0534 by Charletta Cousin, RN Outcome: Progressing 06/04/2022 0534 by Charletta Cousin, RN Outcome: Progressing   Problem: Skin Integrity: Goal: Risk for impaired skin integrity will decrease 06/04/2022 0534 by Charletta Cousin, RN Outcome: Progressing 06/04/2022 0534 by Charletta Cousin, RN Outcome: Progressing   Problem: Activity: Goal: Ability to tolerate increased activity will improve 06/04/2022 0534 by Charletta Cousin, RN Outcome: Progressing 06/04/2022 0534 by Charletta Cousin, RN Outcome: Progressing   Problem: Clinical Measurements: Goal: Ability to maintain a body temperature in the normal range will improve 06/04/2022 0534 by Charletta Cousin, RN Outcome: Progressing 06/04/2022 0534 by Charletta Cousin, RN Outcome: Progressing   Problem: Respiratory: Goal: Ability to maintain adequate ventilation will improve 06/04/2022 0534 by Charletta Cousin, RN Outcome: Progressing 06/04/2022 0534 by Charletta Cousin, RN Outcome: Progressing Goal: Ability to maintain a clear airway will improve 06/04/2022 0534 by Charletta Cousin, RN Outcome: Progressing 06/04/2022 0534 by Charletta Cousin, RN Outcome: Progressing   Problem: Education: Goal:  Understanding of CV disease, CV risk reduction, and recovery process will improve 06/04/2022 0534 by Charletta Cousin, RN Outcome: Progressing 06/04/2022 0534 by Charletta Cousin, RN Outcome: Progressing Goal: Individualized Educational Video(s) 06/04/2022  59 by Charletta Cousin, RN Outcome: Progressing 06/04/2022 0534 by Charletta Cousin, RN Outcome: Progressing   Problem: Activity: Goal: Ability to return to baseline activity level will improve 06/04/2022 0534 by Charletta Cousin, RN Outcome: Progressing 06/04/2022 0534 by Charletta Cousin, RN Outcome: Progressing   Problem: Cardiovascular: Goal: Ability to achieve and maintain adequate cardiovascular perfusion will improve 06/04/2022 0534 by Charletta Cousin, RN Outcome: Progressing 06/04/2022 0534 by Charletta Cousin, RN Outcome: Progressing Goal: Vascular access site(s) Level 0-1 will be maintained 06/04/2022 0534 by Charletta Cousin, RN Outcome: Progressing 06/04/2022 0534 by Charletta Cousin, RN Outcome: Progressing   Problem: Health Behavior/Discharge Planning: Goal: Ability to safely manage health-related needs after discharge will improve 06/04/2022 0534 by Charletta Cousin, RN Outcome: Progressing 06/04/2022 0534 by Charletta Cousin, RN Outcome: Progressing

## 2022-06-04 NOTE — Progress Notes (Addendum)
[name] Progress Note   Subjective:   Patient seen and examined at bedside in dialysis.  Reports breathing better today.  Denies CP, SOB, orthopnea, weakness, dizziness, and n/v/d.  Has not been out of bed since LHC to see if dyspnea on exertion improved.  Hoping to go home soon.   Objective Vitals:   06/04/22 0900 06/04/22 0930 06/04/22 1000 06/04/22 1030  BP: 131/69 116/72 124/66 124/70  Pulse:  100  100  Resp:  (!) 27  (!) 27  Temp:      TempSrc:      SpO2:  97%  96%  Weight:      Height:       Physical Exam General:WDWN male in NAD Heart:RRR, no mrg Lungs:CTAB anterolaterally  Abdomen:soft, NTND Extremities:no LE edema Dialysis Access: LU AVF in use  Filed Weights   06/02/22 1310 06/03/22 0423 06/04/22 0827  Weight: 126.1 kg 125.7 kg 128.5 kg    Intake/Output Summary (Last 24 hours) at 06/04/2022 1050 Last data filed at 06/04/2022 0324 Gross per 24 hour  Intake 270 ml  Output --  Net 270 ml    Additional Objective Labs: Basic Metabolic Panel: Recent Labs  Lab 06/02/22 0111 06/03/22 0041 06/03/22 1550 06/03/22 1602 06/04/22 0140  NA 133* 136 136 135 133*  K 3.7 3.5 3.7 3.7 3.7  CL 100 95*  --   --  93*  CO2 19* 24  --   --  20*  GLUCOSE 108* 94  --   --  92  BUN 59* 45*  --   --  60*  CREATININE 10.00* 8.03*  --   --  10.75*  CALCIUM 7.9* 8.7*  --   --  8.2*  PHOS 5.8* 6.0*  --   --  8.3*   Liver Function Tests: Recent Labs  Lab 06/01/22 0109 06/02/22 0111 06/03/22 0041 06/04/22 0140  AST 17  --   --   --   ALT 11  --   --   --   ALKPHOS 37*  --   --   --   BILITOT 0.5  --   --   --   PROT 6.8  --   --   --   ALBUMIN 3.2* 3.2* 3.3* 3.2*   CBC: Recent Labs  Lab 05/31/22 0831 06/01/22 0109 06/02/22 0111 06/03/22 0041 06/03/22 1550 06/03/22 1602 06/04/22 0140  WBC 9.3 9.3 8.4 8.4  --   --  8.4  NEUTROABS 6.7  --   --   --   --   --   --   HGB 11.4* 10.5* 10.9* 11.8* 11.9* 11.6* 10.8*  HCT 33.9* 31.4* 30.8* 34.6*  35.0* 34.0* 31.8*  MCV 93.4 94.3 91.1 91.8  --   --  92.4  PLT 236 179 222 251  --   --  258   CBG: Recent Labs  Lab 06/01/22 1304 06/01/22 1550 06/01/22 2118 06/02/22 0632 06/02/22 1622  GLUCAP 89 121* 101* 98 117*   Studies/Results: CARDIAC CATHETERIZATION  Result Date: 06/03/2022   Prox LAD to Mid LAD lesion is 60% stenosed.   2nd Diag lesion is 75% stenosed.   Ost LAD lesion is 30% stenosed.   1st Diag lesion is 70% stenosed.   A stent was successfully placed.   Post intervention, there is a 0% residual stenosis. 1.  RFR positive proximal to mid LAD lesion treated with 1 drug-eluting stent with mild to moderate disease elsewhere (that should be treated  medically). 2.  Cardiac output of 20.5 L/min and cardiac index of 8.29 L/min/m due to presence of AV fistula. 3.  Low normal filling pressures with mean RA of 4 mmHg, mean wedge pressure of 5 mmHg, and mean PA pressure of 19 mmHg with LVEDP of 10 mmHg. Recommendation: Dual antiplatelet therapy for preferably 1 year.  Plavix was administered rather than ticagrelor given the lack of an acute coronary syndrome and given the patient's ongoing dyspnea of uncertain etiology.   DG Chest 2 View  Result Date: 06/02/2022 CLINICAL DATA:  Shortness of breath EXAM: CHEST - 2 VIEW COMPARISON:  05/31/2022 FINDINGS: Stable cardiomegaly. Low lung volumes. Trace left-sided pleural effusion with associated left basilar opacity. No pneumothorax. IMPRESSION: Stable radiographic appearance of the chest with trace left-sided pleural effusion and associated left basilar opacity. Electronically Signed   By: Davina Poke D.O.   On: 06/02/2022 15:34    Medications:  sodium chloride      allopurinol  100 mg Oral QPM   amiodarone  200 mg Oral QPM   aspirin  81 mg Oral Daily   atorvastatin  80 mg Oral QPM   calcitRIOL  0.5 mcg Oral Q T,Th,Sa-HD   clopidogrel  75 mg Oral Q breakfast   ferric citrate  210 mg Oral TID WC   loratadine  10 mg Oral QPM    metoprolol succinate  25 mg Oral Daily   sodium chloride flush  3 mL Intravenous Q12H   Warfarin - Pharmacist Dosing Inpatient   Does not apply q1600    Dialysis Orders: TTS [facility] 4h 450/ 600   130.9kg  3K/2.5bath  P2  Hep none  LUA AVF - last HD 9/30, post wt 131.2kg - rocaltrol 0.5 mcg tiw po - last Hb 11.4, no esa     CXR - poor film, IMPRESSION: 1. Mild right base opacity with low lung volumes favoring atelectasis. 2. Chronic cardiomegaly.    BP 155/ 75, HR 80-90, RR 17- 25, afeb, 4L      CTA chest - no PE, mild bilat effusions w/ mild adjacent bilat atx vs consolidation  Assessment/Plan: Chest pain - trops negative, EKG unremarkable. CTA no PE.  LHC with 60% mild LAD s/p stenting. No other significant disease requiring intervention.  Pressures found during LHC indicate patient not volume overloaded.  SOB - CXR w/ vasc congestion. Is now 4-5 under the prior dry wt, (will lower on d/c ~126kg) but pt still SOB w/ exertion. Echo showed ok EF. LHC w/mild disease s/p stenting, pressures indicate not currently volume overloaded.  If continues to have dyspnea with exertion could be high output HF 2/2 AVF.  To reach out to HF team for input.  ESRD - on HD TTS. HD today per regular schedule.  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  MBD ckd - CCa in range, cont vdra po w/ HD. Phos elevated, continue binders.  DM2 - on insulin, per pmd  Nutrition - renal diet w/fluid restriction  Jen Mow, PA-C [place] 06/04/2022,10:50 AM  LOS: 4 days   Pt seen, examined and agree w A/P as above. This patient may have high-output HF. His CO/ CI are both extremely abnormally high by RHC, and his DOE symptoms have only partially improved w/ vol reduction. Have curbsided Dr Haroldine Laws who concurs and recommends repeat RHC w/ AVF manipulation on monday.  Kelly Splinter  MD 06/04/2022, 12:12 PM

## 2022-06-04 NOTE — Progress Notes (Signed)
CARDIAC REHAB PHASE I   Pt received in bed for dialysis, agreeable for education. Pt educated on risk factors, exercise guidelines, angina/NTG, stent, restrictions, site care, nutrition, and orientation to CRP2 referral to [facility]. Pt receptive to education, all questions answered, pt left in bed with RN present.  [id number]  Colbert Ewing, MS 06/04/2022 11:20 AM

## 2022-06-04 NOTE — Progress Notes (Signed)
Pt will place CPAP on when ready to sleep.

## 2022-06-04 NOTE — Progress Notes (Signed)
Progress Note Patient: George Lewis. KNL:[id number] DOB: Oct 17, 1955 DOA: 05/31/2022  DOS: the patient was seen and examined on 06/04/2022  Brief hospital course: Milen Lengacher. is a 66 y.o. male with medical history significant of RCC, A fib on coumadin, HLD, ESRD on HD TTS, DM2.  Presented to the hospital with complaints of progressive worsening shortness of breath ongoing for last 7 days after starting to take new medication for his RCC INLYTA. Cardiology and nephrology consulted. Underwent cardiac catheterization on 10/6.  Assessment and Plan: Acute on chronic diastolic CHF. Paroxysmal A-fib with RVR. Small right pleural effusion with atelectasis. Patient presents with complaints of shortness of breath.  This started immediately after him starting INLYTA. Symptoms progressively worsen without any swelling of the legs or weight gain to the point that he was not able to go to hemodialysis at which point he was brought to the ER. Treated with HD with improvement in symptoms. Echocardiogram shows preserved EF.  No pericardial effusion.  No significant valvular or wall motion abnormalities as well. On exam has faint basilar crackles more on the right side. For now continue volume removal with HD. Continue anticoagulation. Nephrology consulted advanced heart failure team as there is concern for high-output failure in an HD patient. Management per cardiology.   Chest pain. CAD, multivessel There was some concern for pleuritic chest pain which might be due to pericarditis. Started on colchicine although discontinued now as less likely pericarditis. Underwent cardiac catheterization on 10/6.   proximal to mid LAD lesion treated with 1 DES. Management per cardiology. Currently recommend 81 mg aspirin for 1 month and then stop. Recommends Plavix 75 mg daily along with warfarin. Continue Toprol-XL 25 mg daily and Lipitor 80 mg daily.   Concern for community-acquired  pneumonia-ruled out Oncology does not think the patient has Keytruda induced pneumonitis. Discontinue antibiotics.   Type 2 diabetes mellitus, diet controlled with renal complication. Well-controlled 5.3 hemoglobin A1c. Continue current modified diet.   RCC. Follows up with Dr. Alen Blew. Monitor.  Currently holding off on Inlyta.   HLD. Continue statin.   ESRD on HD on TTS schedule. Nephrology consult for HD Appreciate assistance. Transfer from [place] to [facility] for HD needs.   Obesity Body mass index is 36.76 kg/m.  Placing the pt at higher risk of poor outcomes.  OSA on CPAP. Continue CPAP nightly per home regimen.  Subjective: Denies any acute pain.  Nausea and vomiting.  Had an almost passing out event with shortness of breath while getting his weight done before HD.  Physical Exam: Vitals:   06/04/22 1201 06/04/22 1233 06/04/22 1249 06/04/22 1606  BP:  112/70 112/70 (!) 123/59  Pulse:  98 (!) 107 (!) 101  Resp:  16  20  Temp:  98 F (36.7 C)  97.8 F (36.6 C)  TempSrc:  Oral  Oral  SpO2:  98%  94%  Weight: 126.4 kg     Height:       General: Appear in mild distress, no Rash; Oral Mucosa Clear, moist. no Abnormal Neck Mass Or lumps, Conjunctiva normal  Cardiovascular: S1 and S2 Present, no Murmur, Respiratory: good respiratory effort, Bilateral Air entry present and CTA, no Crackles, no wheezes Abdomen: Bowel Sound present, Soft and no tenderness Extremities: no Pedal edema Neurology: alert and oriented to time, place, and person affect appropriate. no new focal deficit Gait not checked due to patient safety concerns   Data Reviewed: I have Reviewed nursing notes, Vitals, and Lab  results since pt's last encounter. Pertinent lab results CBC and BMP I have ordered test including CBC and BMP I have discussed pt's care plan and test results with nephrology.    Family Communication: Wife at bedside  Disposition: Status is: Inpatient Remains inpatient  appropriate because: Need further work-up due to 1 of symptoms of high output state of failure  Author: Berle Mull, MD 06/04/2022 7:12 PM  Please look on [email or web address] to find out who is on call.

## 2022-06-04 NOTE — Progress Notes (Addendum)
Rounding Note    Patient Name: George Lewis. Date of Encounter: 06/04/2022  [place] Cardiologist: Elouise Munroe, MD   Subjective   Cath yesterday showed 30% ostial LAD, 60% mid LAD but RFR was 0.69, 70% D1 and 75% D2.  There was mild plaquing with no significant stenosis in the RCA and left circumflex.  Status post PCI of the mid LAD.  Low normal filling pressures with mean RA pressure 4 mmHg, pulmonary capillary wedge 5 mmHg mean PA pressure 19 mmHg with LVEDP 10 mmHg.  He is now on DAPT with aspirin and Plavix.  Denies any chest pain this morning.  Shortness of breath is significantly improved and he said he was able to lie in bed last night and sleep half the night even without his CPAP on Inpatient Medications    Scheduled Meds:  allopurinol  100 mg Oral QPM   amiodarone  200 mg Oral QPM   aspirin  81 mg Oral Daily   atorvastatin  40 mg Oral QPM   calcitRIOL  0.5 mcg Oral Q T,Th,Sa-HD   clopidogrel  75 mg Oral Q breakfast   ferric citrate  210 mg Oral TID WC   loratadine  10 mg Oral QPM   sodium chloride flush  3 mL Intravenous Q12H   Warfarin - Pharmacist Dosing Inpatient   Does not apply q1600   Continuous Infusions:  sodium chloride     PRN Meds: sodium chloride, acetaminophen **OR** acetaminophen, acetaminophen, albuterol, fluticasone, guaiFENesin, ondansetron (ZOFRAN) IV, prochlorperazine, sodium chloride flush, traMADol   Vital Signs    Vitals:   06/03/22 2337 06/04/22 0336 06/04/22 0409 06/04/22 0413  BP: (!) 143/60 (!) 162/57 (!) 180/63 (!) 168/55  Pulse: 87 83 86 86  Resp: 20     Temp: 97.8 F (36.6 C)  98.8 F (37.1 C)   TempSrc: Axillary  Axillary   SpO2: 99% 96% 98% 97%  Weight:      Height:        Intake/Output Summary (Last 24 hours) at 06/04/2022 0726 Last data filed at 06/04/2022 0324 Gross per 24 hour  Intake 510 ml  Output --  Net 510 ml       06/03/2022    4:23 AM 06/02/2022    1:10 PM 06/02/2022   12:43 PM   Last 3 Weights  Weight (lbs) 277 lb 1.9 oz 278 lb 278 lb  Weight (kg) 125.7 kg 126.1 kg 126.1 kg      Telemetry    Normal sinus rhythm- Personally Reviewed  ECG    No new ECG tracing today. - Personally Reviewed  Physical Exam   GEN: Well nourished, well developed in no acute distress HEENT: Normal NECK: No JVD; No carotid bruits LYMPHATICS: No lymphadenopathy CARDIAC:RRR, no murmurs, rubs, gallops RESPIRATORY:  Clear to auscultation without rales, wheezing or rhonchi  ABDOMEN: Soft, non-tender, non-distended MUSCULOSKELETAL:  No edema; No deformity  SKIN: Warm and dry NEUROLOGIC:  Alert and oriented x 3 PSYCHIATRIC:  Normal affect   Labs    High Sensitivity Troponin:   Recent Labs  Lab 05/31/22 1245 05/31/22 1516  TROPONINIHS 9 11      Chemistry Recent Labs  Lab 06/01/22 0109 06/02/22 0111 06/03/22 0041 06/03/22 1550 06/03/22 1602 06/04/22 0140  NA 133* 133* 136 136 135 133*  K 4.6 3.7 3.5 3.7 3.7 3.7  CL 101 100 95*  --   --  93*  CO2 16* 19* 24  --   --  20*  GLUCOSE 83 108* 94  --   --  92  BUN 57* 59* 45*  --   --  60*  CREATININE 10.36* 10.00* 8.03*  --   --  10.75*  CALCIUM 7.8* 7.9* 8.7*  --   --  8.2*  MG  --  1.6* 2.2  --   --  2.3  PROT 6.8  --   --   --   --   --   ALBUMIN 3.2* 3.2* 3.3*  --   --  3.2*  AST 17  --   --   --   --   --   ALT 11  --   --   --   --   --   ALKPHOS 37*  --   --   --   --   --   BILITOT 0.5  --   --   --   --   --   GFRNONAA 5* 5* 7*  --   --  5*  ANIONGAP 16* 14 17*  --   --  20*     Lipids No results for input(s): "CHOL", "TRIG", "HDL", "LABVLDL", "LDLCALC", "CHOLHDL" in the last 168 hours.  Hematology Recent Labs  Lab 06/02/22 0111 06/03/22 0041 06/03/22 1550 06/03/22 1602 06/04/22 0140  WBC 8.4 8.4  --   --  8.4  RBC 3.38* 3.77*  --   --  3.44*  HGB 10.9* 11.8* 11.9* 11.6* 10.8*  HCT 30.8* 34.6* 35.0* 34.0* 31.8*  MCV 91.1 91.8  --   --  92.4  MCH 32.2 31.3  --   --  31.4  MCHC 35.4 34.1  --    --  34.0  RDW 14.5 14.3  --   --  14.5  PLT 222 251  --   --  258    Thyroid No results for input(s): "TSH", "FREET4" in the last 168 hours.  BNP Recent Labs  Lab 05/31/22 0606  BNP 462.7*     DDimer No results for input(s): "DDIMER" in the last 168 hours.   Radiology    CARDIAC CATHETERIZATION  Result Date: 06/03/2022   Prox LAD to Mid LAD lesion is 60% stenosed.   2nd Diag lesion is 75% stenosed.   Ost LAD lesion is 30% stenosed.   1st Diag lesion is 70% stenosed.   A stent was successfully placed.   Post intervention, there is a 0% residual stenosis. 1.  RFR positive proximal to mid LAD lesion treated with 1 drug-eluting stent with mild to moderate disease elsewhere (that should be treated medically). 2.  Cardiac output of 20.5 L/min and cardiac index of 8.29 L/min/m due to presence of AV fistula. 3.  Low normal filling pressures with mean RA of 4 mmHg, mean wedge pressure of 5 mmHg, and mean PA pressure of 19 mmHg with LVEDP of 10 mmHg. Recommendation: Dual antiplatelet therapy for preferably 1 year.  Plavix was administered rather than ticagrelor given the lack of an acute coronary syndrome and given the patient's ongoing dyspnea of uncertain etiology.   DG Chest 2 View  Result Date: 06/02/2022 CLINICAL DATA:  Shortness of breath EXAM: CHEST - 2 VIEW COMPARISON:  05/31/2022 FINDINGS: Stable cardiomegaly. Low lung volumes. Trace left-sided pleural effusion with associated left basilar opacity. No pneumothorax. IMPRESSION: Stable radiographic appearance of the chest with trace left-sided pleural effusion and associated left basilar opacity. Electronically Signed   By: Davina Poke D.O.   On: 06/02/2022 15:34  Cardiac Studies   Cardiac cath 06/03/2022 Conclusion    Prox LAD to Mid LAD lesion is 60% stenosed.   2nd Diag lesion is 75% stenosed.   Ost LAD lesion is 30% stenosed.   1st Diag lesion is 70% stenosed.   A stent was successfully placed.   Post intervention, there is  a 0% residual stenosis.   1.  RFR positive proximal to mid LAD lesion treated with 1 drug-eluting stent with mild to moderate disease elsewhere (that should be treated medically). 2.  Cardiac output of 20.5 L/min and cardiac index of 8.29 L/min/m due to presence of AV fistula. 3.  Low normal filling pressures with mean RA of 4 mmHg, mean wedge pressure of 5 mmHg, and mean PA pressure of 19 mmHg with LVEDP of 10 mmHg.   Recommendation: Dual antiplatelet therapy for preferably 1 year.  Plavix was administered rather than ticagrelor given the lack of an acute coronary syndrome and given the patient's ongoing dyspnea of uncertain etiology.  Coronary Diagrams  Diagnostic Dominance: Right  Intervention    Echocardiogram 06/01/2022: Impressions:  1. Left ventricular ejection fraction, by estimation, is 55 to 60%. The  left ventricle has normal function. The left ventricle has no regional  wall motion abnormalities. Left ventricular diastolic parameters are  consistent with Grade II diastolic  dysfunction (pseudonormalization).   2. Right ventricular systolic function is normal. The right ventricular  size is normal. Tricuspid regurgitation signal is inadequate for assessing  PA pressure.   3. Left atrial size was mildly dilated.   4. Right atrial size was mildly dilated.   5. The mitral valve is normal in structure. Mild mitral valve  regurgitation. No evidence of mitral stenosis.   6. The aortic valve is tricuspid. There is moderate calcification of the  aortic valve. Aortic valve regurgitation is not visualized. Mild to  moderate aortic valve stenosis. Aortic valve area, by VTI measures 1.44  cm. Aortic valve mean gradient measures   18.0 mmHg.   7. Aortic dilatation noted. There is mild dilatation of the ascending  aorta, measuring 41 mm.   8. IVC not well-visualized .     Patient Profile     66 y.o. 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  Assessment & Plan    Dyspnea Possible Acute Diastolic CHF Patient presented with progressive shortness of breath and weakness.  BNP elevated at 462 may be underrepresented given morbid obesity.  Chest x-ray showed chronic cardiomegaly with mild right base opacity with low lung volumes favoring atelectasis.  CTA was negative for PE but did show a new small right pleural effusion with adjacent consolidation as well as an unchanged left pleural effusion.  Echo showed EF of 55-60% with no regional wall motion abnormalities and grade 2 diastolic dysfunction  as well as mild MR, mild to moderate AS with mean gradient of 18 mmHg, and mild dilation fo the ascending aorta measuring 41 mm. Overall picture is confusing.  He describes worsening shortness of breath and orthopnea but states he has not been gaining weight and they have had trouble removing fluid during dialysis sessions due to  hypotension.   - Symptoms improving with dialysis but still having significant shortness of breath with minimal activity. - Cath yesterday showed a flow-limiting lesion in the mid LAD visually appeared to be 60% but RFR was low at 0.69 status post PCI - Heart cath yesterday showed normal filling pressures and right heart pressures with pulmonary capillary wedge 5 mmHg, LVEDP 10 mmHg and RA pressure 4 mmHg.  Mean PA pressure was 19 mmHg.  He appears to be adequately diuresed with his dialysis sessions -his shortness of breath is significantly improved after PCI so I am hopeful that his obstructive LAD lesion was the culprit for his  shortness of breath - Continue dialysis per Nephrology.   -Since he is on amiodarone and has not had any PFTs that I can find I will go ahead and order these while he is in the hospital with DLCO to make sure he has not had any effects from amiodarone  Chest Pain/ASCAD Patient presented with chest pain that was somewhat positional and strongly associated with orthopnea. EKG showed no acute ischemic changes and high-sensitivity troponin negative x2. ESR and CRP elevated at 76 and 3.0 respectively. Chest CTA negative for PE. Echo showed normal LV function and wall motion. -Cath showed 30% ostial LAD, 60% mid LAD but RFR was 0.69, 70% D1 and 75% D2.  There was mild plaquing with no significant stenosis in the RCA and left circumflex.  Status post PCI of the mid LAD. -He has not had any further chest pain -Continue aspirin 81 mg daily, atorvastatin 40 mg daily, Plavix 75 mg daily -continue aspirin and Plavix as well as Coumadin for 1 month and then drop the aspirin -add on Toprol-XL 25 mg daily for antianginal therapy given his residual disease in the first and second diagonal   Paroxysmal Atrial Fibrillation - Continues to maintain normal sinus rhythm on telemetry  - Continue amiodarone 200 mg daily - On chronic anticoagulation with Coumadin at home.  - Restarted on warfarin per pharmacy   Hypertension -BP is elevated on exam today  -He has had some problems with low blood pressures on dialysis requiring midodrine but did well with dialysis yesterday with no drops in blood pressure  -Starting Toprol-XL 25 mg daily for antianginal therapy  Hyperlipidemia -LDL goal less than 55 in the setting of diabetes, CAD, hypertension  -Increase atorvastatin to 80 mg daily -Check FLP and ALT in 6 weeks   ESRD - On hemodialysis T/Th/Sat. - Management per Nephrology.   Otherwise, per primary team: - Community acquired pneumonia  - Metastatic renal cell carcinoma - Type 2 diabetes mellitus  [facility] will sign off.   Medication Recommendations: Aspirin 81 mg daily for 1 month and then stop, amiodarone 200 mg daily, atorvastatin 80 mg daily, Plavix 75 mg daily, Toprol-XL 25 mg daily and warfarin per pharmacy Other recommendations (labs, testing, etc): FLP and ALT in 6 weeks Follow up as an outpatient: Dr. Margaretann Loveless in 2 to 3 weeks  I have spent a total of 35 minutes with patient reviewing cardiac cath, telemetry, EKGs, labs and examining patient as well as establishing an assessment and plan that was discussed with the patient.  > 50% of time was spent in direct patient care.     For questions or updates, please contact [place] Please consult [email or web address] for contact info under        Signed, Fransico Him, MD  06/04/2022, 7:26 AM

## 2022-06-04 NOTE — Progress Notes (Signed)
ANTICOAGULATION CONSULT NOTE - Follow Up Consult  Pharmacy Consult for warfarin  Indication: atrial fibrillation  Allergies  Allergen Reactions   Penicillins Rash and Other (See Comments)    Has patient had a PCN reaction causing immediate rash, facial/tongue/throat swelling, SOB or lightheadedness with hypotension: yes Has patient had a PCN reaction causing severe rash involving mucus membranes or skin necrosis: no Has patient had a PCN reaction that required hospitalization: no Has patient had a PCN reaction occurring within the last 10 years: no If all of the above answers are "NO", then may proceed with Cephalosporin use.     Patient Measurements: Height: 6\' 1"  (185.4 cm) Weight: 126.4 kg (278 lb 10.6 oz) IBW/kg (Calculated) : 79.9 Heparin Dosing Weight: 109 kg   Vital Signs: Temp: 98 F (36.7 C) (10/07 1233) Temp Source: Oral (10/07 1233) BP: 112/70 (10/07 1249) Pulse Rate: 107 (10/07 1249)  Labs: Recent Labs    06/02/22 0111 06/03/22 0041 06/03/22 1550 06/03/22 1602 06/04/22 0140  HGB 10.9* 11.8* 11.9* 11.6* 10.8*  HCT 30.8* 34.6* 35.0* 34.0* 31.8*  PLT 222 251  --   --  258  LABPROT 23.0* 19.6*  --   --  17.5*  INR 2.1* 1.7*  --   --  1.5*  CREATININE 10.00* 8.03*  --   --  10.75*     Estimated Creatinine Clearance: 9.8 mL/min (A) (by C-G formula based on SCr of 10.75 mg/dL (H)).   Medications:  Scheduled:   allopurinol  100 mg Oral QPM   amiodarone  200 mg Oral QPM   aspirin  81 mg Oral Daily   atorvastatin  80 mg Oral QPM   calcitRIOL  0.5 mcg Oral Q T,Th,Sa-HD   clopidogrel  75 mg Oral Q breakfast   ferric citrate  210 mg Oral TID WC   loratadine  10 mg Oral QPM   metoprolol succinate  25 mg Oral Daily   sodium chloride flush  3 mL Intravenous Q12H   Warfarin - Pharmacist Dosing Inpatient   Does not apply q1600    Assessment: Pt is a 31 yoM with PMH significant for RCC, afib (on warfarin), ESRD on HD TTS, T2DM. Pt admitted with SOB - concern  for pericarditis, PNA. Pharmacy consulted to dose warfarin. Home dose: 3mg  daily except for 1.5mg  on Mon and Fri  INR is subtherapeutic today at 1.5. Hgb 10.8, plt wnl. No signs or symptoms of bleeding noted.   Goal of Therapy:  INR 2-3 Monitor platelets by anticoagulation protocol: Yes   Plan:  Give warfarin 3 mg x1 tonight Check daily INR Monitor CBC and s/sx of bleeding  Louanne Belton, PharmD, [facility] PGY1 Pharmacy Resident 06/04/2022 2:43 PM

## 2022-06-05 DIAGNOSIS — R0609 Other forms of dyspnea: Secondary | ICD-10-CM

## 2022-06-05 DIAGNOSIS — R079 Chest pain, unspecified: Secondary | ICD-10-CM | POA: Diagnosis not present

## 2022-06-05 LAB — CBC
HCT: 34.2 % — ABNORMAL LOW (ref 39.0–52.0)
Hemoglobin: 11.6 g/dL — ABNORMAL LOW (ref 13.0–17.0)
MCH: 31.7 pg (ref 26.0–34.0)
MCHC: 33.9 g/dL (ref 30.0–36.0)
MCV: 93.4 fL (ref 80.0–100.0)
Platelets: 277 10*3/uL (ref 150–400)
RBC: 3.66 MIL/uL — ABNORMAL LOW (ref 4.22–5.81)
RDW: 14.8 % (ref 11.5–15.5)
WBC: 9 10*3/uL (ref 4.0–10.5)
nRBC: 0 % (ref 0.0–0.2)

## 2022-06-05 LAB — PROTIME-INR
INR: 1.4 — ABNORMAL HIGH (ref 0.8–1.2)
Prothrombin Time: 17 seconds — ABNORMAL HIGH (ref 11.4–15.2)

## 2022-06-05 LAB — LIPOPROTEIN A (LPA): Lipoprotein (a): 20.5 nmol/L (ref <75.0)

## 2022-06-05 MED ORDER — WARFARIN SODIUM 5 MG PO TABS
5.0000 mg | ORAL_TABLET | Freq: Once | ORAL | Status: AC
  Administered 2022-06-05: 5 mg via ORAL
  Filled 2022-06-05: qty 1

## 2022-06-05 NOTE — Progress Notes (Signed)
ANTICOAGULATION CONSULT NOTE - Follow Up Consult  Pharmacy Consult for warfarin  Indication: atrial fibrillation  Allergies  Allergen Reactions   Penicillins Rash and Other (See Comments)    Has patient had a PCN reaction causing immediate rash, facial/tongue/throat swelling, SOB or lightheadedness with hypotension: yes Has patient had a PCN reaction causing severe rash involving mucus membranes or skin necrosis: no Has patient had a PCN reaction that required hospitalization: no Has patient had a PCN reaction occurring within the last 10 years: no If all of the above answers are "NO", then may proceed with Cephalosporin use.     Patient Measurements: Height: 6\' 1"  (185.4 cm) Weight: 126.4 kg (278 lb 10.6 oz) IBW/kg (Calculated) : 79.9 Heparin Dosing Weight: 109 kg   Vital Signs: Temp: 97.2 F (36.2 C) (10/08 0443) Temp Source: Axillary (10/08 0443) BP: 105/57 (10/08 0443) Pulse Rate: 84 (10/08 0443)  Labs: Recent Labs    06/03/22 0041 06/03/22 1550 06/03/22 1602 06/04/22 0140 06/05/22 0205  HGB 11.8*   < > 11.6* 10.8* 11.6*  HCT 34.6*   < > 34.0* 31.8* 34.2*  PLT 251  --   --  258 277  LABPROT 19.6*  --   --  17.5* 17.0*  INR 1.7*  --   --  1.5* 1.4*  CREATININE 8.03*  --   --  10.75*  --    < > = values in this interval not displayed.     Estimated Creatinine Clearance: 9.8 mL/min (A) (by C-G formula based on SCr of 10.75 mg/dL (H)).   Medications:  Scheduled:   allopurinol  100 mg Oral QPM   amiodarone  200 mg Oral QPM   aspirin  81 mg Oral Daily   atorvastatin  80 mg Oral QPM   calcitRIOL  0.5 mcg Oral Q T,Th,Sa-HD   clopidogrel  75 mg Oral Q breakfast   ferric citrate  210 mg Oral TID WC   loratadine  10 mg Oral QPM   metoprolol succinate  25 mg Oral Daily   sodium chloride flush  3 mL Intravenous Q12H   Warfarin - Pharmacist Dosing Inpatient   Does not apply q1600    Assessment: Pt is a 31 yoM with PMH significant for RCC, afib (on warfarin),  ESRD on HD TTS, T2DM. Pt admitted with SOB - concern for pericarditis, PNA. Pharmacy consulted to dose warfarin. Home dose: 3mg  daily except for 1.5mg  on Mon and Fri  INR is subtherapeutic today at 1.4. CBC stable. No signs or symptoms of bleeding noted.   Goal of Therapy:  INR 2-3 Monitor platelets by anticoagulation protocol: Yes   Plan:  Give warfarin 5 mg x1 tonight Check daily INR Monitor CBC and s/sx of bleeding  Louanne Belton, PharmD, [facility] PGY1 Pharmacy Resident 06/05/2022 8:59 AM

## 2022-06-05 NOTE — Progress Notes (Signed)
[name] Progress Note   Subjective:   Patient seen and examined at bedside.  Denies CP, abdominal pain and n/v/d.  Reports dyspnea with ambulating to the bathroom.  States it is improved compared to before. Dr. Jonnie Finner talked to Dr. Shelbie Proctor who concurs with possible high output HF and recommends repeat RHC with AVF manipulation.   Objective Vitals:   06/04/22 2015 06/05/22 0011 06/05/22 0443 06/05/22 0906  BP: 106/60 (!) 106/52 (!) 105/57   Pulse: 90 84 84   Resp: 16 16 16 18   Temp: 98.3 F (36.8 C) 98.4 F (36.9 C) (!) 97.2 F (36.2 C) 98.6 F (37 C)  TempSrc: Oral Oral Axillary   SpO2:    97%  Weight:      Height:       Physical Exam General:WDWN male in NAD Heart:RRR, no mrg Lungs:CTAB, nml WOB on RA Abdomen:soft, NTND Extremities:no LE edema Dialysis Access: LU AVF +b/t   Filed Weights   06/03/22 0423 06/04/22 0827 06/04/22 1201  Weight: 125.7 kg 128.5 kg 126.4 kg    Intake/Output Summary (Last 24 hours) at 06/05/2022 1238 Last data filed at 06/05/2022 0950 Gross per 24 hour  Intake 237 ml  Output --  Net 237 ml    Additional Objective Labs: Basic Metabolic Panel: Recent Labs  Lab 06/02/22 0111 06/03/22 0041 06/03/22 1550 06/03/22 1602 06/04/22 0140  NA 133* 136 136 135 133*  K 3.7 3.5 3.7 3.7 3.7  CL 100 95*  --   --  93*  CO2 19* 24  --   --  20*  GLUCOSE 108* 94  --   --  92  BUN 59* 45*  --   --  60*  CREATININE 10.00* 8.03*  --   --  10.75*  CALCIUM 7.9* 8.7*  --   --  8.2*  PHOS 5.8* 6.0*  --   --  8.3*   Liver Function Tests: Recent Labs  Lab 06/01/22 0109 06/02/22 0111 06/03/22 0041 06/04/22 0140  AST 17  --   --   --   ALT 11  --   --   --   ALKPHOS 37*  --   --   --   BILITOT 0.5  --   --   --   PROT 6.8  --   --   --   ALBUMIN 3.2* 3.2* 3.3* 3.2*    CBC: Recent Labs  Lab 05/31/22 0831 06/01/22 0109 06/02/22 0111 06/03/22 0041 06/03/22 1550 06/03/22 1602 06/04/22 0140 06/05/22 0205  WBC 9.3 9.3  8.4 8.4  --   --  8.4 9.0  NEUTROABS 6.7  --   --   --   --   --   --   --   HGB 11.4* 10.5* 10.9* 11.8*   < > 11.6* 10.8* 11.6*  HCT 33.9* 31.4* 30.8* 34.6*   < > 34.0* 31.8* 34.2*  MCV 93.4 94.3 91.1 91.8  --   --  92.4 93.4  PLT 236 179 222 251  --   --  258 277   < > = values in this interval not displayed.    Studies/Results: CARDIAC CATHETERIZATION  Result Date: 06/03/2022   Prox LAD to Mid LAD lesion is 60% stenosed.   2nd Diag lesion is 75% stenosed.   Ost LAD lesion is 30% stenosed.   1st Diag lesion is 70% stenosed.   A stent was successfully placed.   Post intervention, there is a 0% residual  stenosis. 1.  RFR positive proximal to mid LAD lesion treated with 1 drug-eluting stent with mild to moderate disease elsewhere (that should be treated medically). 2.  Cardiac output of 20.5 L/min and cardiac index of 8.29 L/min/m due to presence of AV fistula. 3.  Low normal filling pressures with mean RA of 4 mmHg, mean wedge pressure of 5 mmHg, and mean PA pressure of 19 mmHg with LVEDP of 10 mmHg. Recommendation: Dual antiplatelet therapy for preferably 1 year.  Plavix was administered rather than ticagrelor given the lack of an acute coronary syndrome and given the patient's ongoing dyspnea of uncertain etiology.    Medications:  sodium chloride      allopurinol  100 mg Oral QPM   amiodarone  200 mg Oral QPM   aspirin  81 mg Oral Daily   atorvastatin  80 mg Oral QPM   calcitRIOL  0.5 mcg Oral Q T,Th,Sa-HD   clopidogrel  75 mg Oral Q breakfast   ferric citrate  210 mg Oral TID WC   loratadine  10 mg Oral QPM   metoprolol succinate  25 mg Oral Daily   sodium chloride flush  3 mL Intravenous Q12H   warfarin  5 mg Oral ONCE-1600   Warfarin - Pharmacist Dosing Inpatient   Does not apply q1600    Dialysis Orders: TTS [facility] 4h 450/ 600   130.9kg  3K/2.5bath  P2  Hep none  LUA AVF - last HD 9/30, post wt 131.2kg - rocaltrol 0.5 mcg tiw po - last Hb 11.4, no esa     CXR - poor film,  IMPRESSION: 1. Mild right base opacity with low lung volumes favoring atelectasis. 2. Chronic cardiomegaly.    BP 155/ 75, HR 80-90, RR 17- 25, afeb, 4L [place]     CTA chest - no PE, mild bilat effusions w/ mild adjacent bilat atx vs consolidation   Assessment/Plan: Chest pain - trops negative, EKG unremarkable. CTA no PE.  Cath with 60% mild LAD s/p stenting. No other significant disease requiring intervention.  Pressures found during cath indicate patient not volume overloaded.  SOB - CXR w/ vasc congestion. Is now 4-5 under the prior dry wt, (will lower on d/c ~126kg) but pt still SOB w/ exertion. Echo showed ok EF. cath w/mild disease s/p stenting of LAD, pressures indicate not currently volume overloaded.  Patient continues to have dyspnea w/exertion.  His CO/ CI are both extremely abnormally high by cath, and his DOE symptoms have only partially improved w/ vol reduction.  He may have high output HF.  Dr. Jonnie Finner spoke with Dr. Haroldine Laws who concurred and recommends repeat RHC with AVF manipulation on Monday. ESRD - on HD TTS. Next HD 06/07/22. 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  MBD ckd - CCa in range, cont vdra po w/ HD. Phos elevated, continue binders.  DM2 - on insulin, per pmd  Nutrition - renal diet w/fluid restriction   Jen Mow, PA-C [name] 06/05/2022,12:38 PM  LOS: 5 days

## 2022-06-05 NOTE — Progress Notes (Addendum)
Mobility Specialist - Progress Note   06/05/22 1040  Mobility  Activity Ambulated with assistance in hallway  Activity Response Tolerated fair  Distance Ambulated (ft) 350 ft  $Mobility charge 1 Mobility  Level of Assistance Standby assist, set-up cues, supervision of patient - no hands on  Assistive Device None  Mobility Referral Yes    Post-mobility: 95/52 BP, 91% SpO2  Pt was received in chair and agreeable to mobility. Pt c/o SOB and feeling light headed during ambulation. Pt was returned to bed with all needs met and RN notified.   Larey Seat

## 2022-06-05 NOTE — Progress Notes (Signed)
Advanced HF Consult Brief Note   Unable to access Epic remotely through [employer] so brief note left from mobile device. Full note to follow  66 y/o make with PAF, OSA, stage IV renal cell CA s/p nephrectomy with recently discovered lung metastases and ESRD on HD whom we are asked to see regarding high output on RHC.   Started HD 3 years ago. Initially had AVF in left wrist but this failed immediately. Had HD through  perm cath for a while then established LUE AVF which has worked well since it was placed > 2 years ago.   Reports 2 month h/o mild DOE. Says it got really bad after starting Enlyta about a week ago for his lung CA.   Came to ER with CP and SOB. Hstrop and ECG un remarkable. Echo EF 65% with mild to moderate AS. RV normal size and function. CXR basilar atx   Underwent R/L cath had moderate CAD visually but mid LAD 60 % was hemodynamically significant by FFR so had PCI/stenting  RHC with RA 4 PA mean 19 PCWP 5 PA sat x 1 = 88% (no recheck). CO 20.6 L/min. Thermodilution and shunt run not performed.   Denies h/o significant LE edema or other RV failure symptoms.   Now feels back to baseline   On exam  Obese male lying flat in bed on nasal pillow CPAP Neck supple no JVD bilateral carotid bruits Cor Regular 2/6 AS. S2 cr isp Lungs decreased at bases o/w clear Ab obese NT/ND. No bruits  Ext warm no edema. Normal sized LUE AVF with thrill  Neuro a &o x 3. Cranial nerves grossly intact. Normal strength all 4 extremities.   A/P.   1. Dyspnea and elevated cardiac output on RHC  - while it is common for patients to have elevated cardiac outputs in setting of active AVF, 20.6 liters/min is far greater than what I would expect giv en the normal size of his AVF unless there is another concomitant process going on such as an intracardiac, intrapulmonary or intraabdominal shunt present. In addition, he has no signs or symptoms of high-output HF and his RV is normal on exam/echo and his  PA pressures are much lower than I would expect in a true high-output state. Thus my suspicion is that the calculated Fick cardiac output is likely overestimated/erroneous based on a  single PA saturation measurement.  - ideally we would repeat his RHC with at least two measurements of his PA sat + thermodilution measurements and a full shunt run as needed. If his outputs were truly > 10L/min could perform serial measurements during stepwise compression of his AVF.  - However, given lack of clinical evidence of a high output state and little room to modify his AVF given its normal size, I am not sure that we  will have much to gain by repeating his RHC at this point. We will plan repeat echo with bubble study in the morning to try to assess for a component of intracardiac shunting and/or to get a sense of the overall magnitude of his peripheral shunting. If the bubble study is impressive we will proceed with repeat RHC otherwise will continue to manage medically.  - of note, ESR of 76 raises concern for possible early amiodarone lung tox icity in the setting of his recent insidious development of dyspnea and would strongly consider a hi-res chest CT and a trial off Amio for now +/- a short course of steroids.  Glori Bickers, MD

## 2022-06-05 NOTE — Progress Notes (Addendum)
Progress Note Patient: George Lewis. IWL:[id number] DOB: March 12, 1956 DOA: 05/31/2022  DOS: the patient was seen and examined on 06/05/2022  Brief hospital course: Greogry Goodwyn. is a 66 y.o. male with medical history significant of RCC, A fib on coumadin, HLD, ESRD on HD TTS, DM2.  Presented to the hospital with complaints of progressive worsening shortness of breath ongoing for last 7 days after starting to take new medication for his RCC INLYTA. Cardiology and nephrology consulted. Underwent cardiac catheterization on 10/6. Due to patient's ongoing numbness or shortness of breath as well as significantly high right-heart cath pressures advanced heart failure services was consulted.  Patient currently undergoing bubble study tomorrow.  Assessment and Plan: Acute on chronic diastolic CHF. Paroxysmal A-fib with RVR. Small right pleural effusion with atelectasis. Patient presents with complaints of shortness of breath.  This started immediately after him starting INLYTA. Symptoms progressively worsen without any swelling of the legs or weight gain to the point that he was not able to go to hemodialysis at which point he was brought to the ER. Treated with HD with improvement in symptoms. Echocardiogram shows preserved EF.  No pericardial effusion.  No significant valvular or wall motion abnormalities as well. On exam has faint basilar crackles more on the right side. For now continue volume removal with HD. Continue anticoagulation. Nephrology consulted advanced heart failure team as there is concern for high-output failure in an HD patient. Bubble study planned.   Per my discussion with cardiology, no need to stop warfarin, no need to bridge with heparin, no need for NPO. Management per cardiology.   Chest pain. CAD, multivessel There was some concern for pleuritic chest pain which might be due to pericarditis. Started on colchicine although discontinued now as less likely  pericarditis. Underwent cardiac catheterization on 10/6.   proximal to mid LAD lesion treated with 1 DES. Management per cardiology. Currently recommend 81 mg aspirin for 1 month and then stop. Recommends Plavix 75 mg daily along with warfarin. Continue Toprol-XL 25 mg daily and Lipitor 80 mg daily.  Concern for community-acquired pneumonia-ruled out Oncology does not think the patient has Keytruda induced pneumonitis. Discontinue antibiotics.   Type 2 diabetes mellitus, diet controlled with renal complication. Well-controlled 5.3 hemoglobin A1c. Continue current modified diet.   RCC. Follows up with Dr. Alen Blew. Monitor.  Currently holding off on Inlyta.   HLD. Continue statin.   ESRD on HD on TTS schedule. Nephrology consult for HD Appreciate assistance. Transfer from [place] [facility] for HD needs.   Obesity Body mass index is 36.76 kg/m.  Placing the pt at higher risk of poor outcomes.   OSA on CPAP. Continue CPAP nightly per home regimen.  Subjective:  No nausea no vomiting.  Reports shortness of breath on exertion.  No fever no chills.  No chest pain.  No chest tightness.  No bleeding.  Physical Exam: Vitals:   06/05/22 0905 06/05/22 0906 06/05/22 1035 06/05/22 1735  BP: (!) 108/55  (!) 95/52 (!) 109/54  Pulse:   86 81  Resp:  18  18  Temp:  98.6 F (37 C)  98.8 F (37.1 C)  TempSrc:    Oral  SpO2:  97% (!) 89% 96%  Weight:      Height:       General: Appear in mild distress; no visible Abnormal Neck Mass Or lumps, Conjunctiva normal Cardiovascular: S1 and S2 Present, no Murmur, Respiratory: good respiratory effort, Bilateral Air entry present and CTA,  no Crackles, no wheezes Abdomen: Bowel Sound present, Non tender  Extremities: bilateral Pedal edema Neurology: alert and oriented to time, place, and person  Gait not checked due to patient safety concerns   Data Reviewed: I have Reviewed nursing notes, Vitals, and Lab results since pt's last  encounter. Pertinent lab results CBC and BMP I have ordered test including CBC and BMP I have discussed pt's care plan and test results with nephrology and cardiology.   Family Communication: Wife at bedside  Disposition: Status is: Inpatient Remains inpatient appropriate because: Need for further work-up for high output failure.  Author: Berle Mull, MD 06/05/2022 5:52 PM  Please look on [email or web address] to find out who is on call.

## 2022-06-06 ENCOUNTER — Inpatient Hospital Stay (HOSPITAL_COMMUNITY): Payer: Medicare Other

## 2022-06-06 ENCOUNTER — Encounter (HOSPITAL_COMMUNITY): Payer: Self-pay | Admitting: Internal Medicine

## 2022-06-06 DIAGNOSIS — Q2112 Patent foramen ovale: Secondary | ICD-10-CM

## 2022-06-06 LAB — RENAL FUNCTION PANEL
Albumin: 3.5 g/dL (ref 3.5–5.0)
Anion gap: 18 — ABNORMAL HIGH (ref 5–15)
BUN: 67 mg/dL — ABNORMAL HIGH (ref 8–23)
CO2: 25 mmol/L (ref 22–32)
Calcium: 8.5 mg/dL — ABNORMAL LOW (ref 8.9–10.3)
Chloride: 91 mmol/L — ABNORMAL LOW (ref 98–111)
Creatinine, Ser: 11.86 mg/dL — ABNORMAL HIGH (ref 0.61–1.24)
GFR, Estimated: 4 mL/min — ABNORMAL LOW (ref >60)
Glucose, Bld: 97 mg/dL (ref 70–99)
Phosphorus: 8.4 mg/dL — ABNORMAL HIGH (ref 2.5–4.6)
Potassium: 3.4 mmol/L — ABNORMAL LOW (ref 3.5–5.1)
Sodium: 134 mmol/L — ABNORMAL LOW (ref 135–145)

## 2022-06-06 LAB — CBC
HCT: 34.7 % — ABNORMAL LOW (ref 39.0–52.0)
Hemoglobin: 11.7 g/dL — ABNORMAL LOW (ref 13.0–17.0)
MCH: 31.5 pg (ref 26.0–34.0)
MCHC: 33.7 g/dL (ref 30.0–36.0)
MCV: 93.5 fL (ref 80.0–100.0)
Platelets: 281 10*3/uL (ref 150–400)
RBC: 3.71 MIL/uL — ABNORMAL LOW (ref 4.22–5.81)
RDW: 14.6 % (ref 11.5–15.5)
WBC: 11.2 10*3/uL — ABNORMAL HIGH (ref 4.0–10.5)
nRBC: 0 % (ref 0.0–0.2)

## 2022-06-06 LAB — PROTIME-INR
INR: 1.3 — ABNORMAL HIGH (ref 0.8–1.2)
Prothrombin Time: 16.1 seconds — ABNORMAL HIGH (ref 11.4–15.2)

## 2022-06-06 LAB — SEDIMENTATION RATE: Sed Rate: 79 mm/hr — ABNORMAL HIGH (ref 0–16)

## 2022-06-06 MED ORDER — HEPARIN SODIUM (PORCINE) 1000 UNIT/ML DIALYSIS: 1000.0000 [IU] | INTRAMUSCULAR | Status: DC | PRN

## 2022-06-06 MED ORDER — METOPROLOL SUCCINATE ER 25 MG PO TB24: 25.0000 mg | ORAL_TABLET | Freq: Every day | ORAL | 0 refills | Status: DC

## 2022-06-06 MED ORDER — LIDOCAINE-PRILOCAINE 2.5-2.5 % EX CREA: 1.0000 | TOPICAL_CREAM | CUTANEOUS | Status: DC | PRN

## 2022-06-06 MED ORDER — ASPIRIN 81 MG PO TBEC: 81.0000 mg | DELAYED_RELEASE_TABLET | Freq: Every day | ORAL | 0 refills | Status: AC

## 2022-06-06 MED ORDER — CHLORHEXIDINE GLUCONATE CLOTH 2 % EX PADS: 6.0000 | MEDICATED_PAD | Freq: Every day | CUTANEOUS | Status: DC

## 2022-06-06 MED ORDER — ALTEPLASE 2 MG IJ SOLR: 2.0000 mg | Freq: Once | INTRAMUSCULAR | Status: DC | PRN

## 2022-06-06 MED ORDER — WARFARIN SODIUM 2 MG PO TABS
3.0000 mg | ORAL_TABLET | Freq: Once | ORAL | Status: AC
  Administered 2022-06-06: 3 mg via ORAL
  Filled 2022-06-06: qty 1

## 2022-06-06 MED ORDER — AXITINIB 5 MG PO TABS: 5.0000 mg | ORAL_TABLET | Freq: Two times a day (BID) | ORAL | 1 refills | Status: DC

## 2022-06-06 MED ORDER — CLOPIDOGREL BISULFATE 75 MG PO TABS: 75.0000 mg | ORAL_TABLET | Freq: Every day | ORAL | 0 refills | Status: AC

## 2022-06-06 MED ORDER — LIDOCAINE HCL (PF) 1 % IJ SOLN: 5.0000 mL | INTRAMUSCULAR | Status: DC | PRN

## 2022-06-06 MED ORDER — CALCITRIOL 0.5 MCG PO CAPS: 0.5000 ug | ORAL_CAPSULE | ORAL | 0 refills | Status: DC

## 2022-06-06 MED ORDER — POTASSIUM CHLORIDE CRYS ER 20 MEQ PO TBCR
40.0000 meq | EXTENDED_RELEASE_TABLET | Freq: Once | ORAL | Status: AC
  Administered 2022-06-06: 40 meq via ORAL
  Filled 2022-06-06: qty 2

## 2022-06-06 MED ORDER — PENTAFLUOROPROP-TETRAFLUOROETH EX AERO: 1.0000 | INHALATION_SPRAY | CUTANEOUS | Status: DC | PRN

## 2022-06-06 NOTE — Progress Notes (Signed)
CARDIAC REHAB PHASE I   Pt just back to bed for rest. He states he has been ambulating in room this morning, notes slight sob but feels it is has improved from last few days. Reviewed education provided Saturday. All questions and concerns addressed. Awaiting echo results to determine if repeat cath today will be performed. Will continue to follow.   [id number]  Vanessa Barbara, RN BSN 06/06/2022 10:51 AM

## 2022-06-06 NOTE — TOC Initial Note (Signed)
Transition of Care (TOC) - Initial/Assessment Note    Patient Details  Name: George Lewis. MRN: [id number] Date of Birth: 1956/04/19  Transition [facility]) CM/SW Contact:    Erenest Rasher, RN Phone Number: [phone] 06/06/2022, 4:02 PM  Clinical Narrative:                  HF TOC CM spoke to pt and wife at bedside. Pt states he sees his specialist. Deferred CM making appt for PCP follow up. Wife provides transportation when pt is not up to driving. Has scale at home and weighs daily. Pt states he try to adhere to heart healthy renal diet.    Expected Discharge Plan: Home/Self Care Barriers to Discharge: No Barriers Identified   Patient Goals and CMS Choice Patient states their goals for this hospitalization and ongoing recovery are:: wants to remain well      Expected Discharge Plan and Services Expected Discharge Plan: Home/Self Care   Discharge Planning Services: CM Consult   Living arrangements for the past 2 months: Single Family Home Expected Discharge Date: 06/06/22                                    Prior Living Arrangements/Services Living arrangements for the past 2 months: Single Family Home Lives with:: Spouse Patient language and need for interpreter reviewed:: Yes Do you feel safe going back to the place where you live?: Yes      Need for Family Participation in Patient Care: No (Comment) Care giver support system in place?: Yes (comment) Current home services: DME (scale, blood pressure cuff, glucometer) Criminal Activity/Legal Involvement Pertinent to Current Situation/Hospitalization: No - Comment as needed  Activities of Daily Living Home Assistive Devices/Equipment: None ADL Screening (condition at time of admission) Patient's cognitive ability adequate to safely complete daily activities?: Yes Is the patient deaf or have difficulty hearing?: No Does the patient have difficulty seeing, even when wearing glasses/contacts?:  No Does the patient have difficulty concentrating, remembering, or making decisions?: No Patient able to express need for assistance with ADLs?: Yes Does the patient have difficulty dressing or bathing?: No Independently performs ADLs?: Yes (appropriate for developmental age) Does the patient have difficulty walking or climbing stairs?: Yes (climbing stairs) Weakness of Legs: None Weakness of Arms/Hands: None  Permission Sought/Granted Permission sought to share information with : Case Manager, Family Supports Permission granted to share information with : Yes, Verbal Permission Granted              Emotional Assessment Appearance:: Appears stated age Attitude/Demeanor/Rapport: Engaged Affect (typically observed): Accepting Orientation: : Oriented to Self, Oriented to Place, Oriented to  Time, Oriented to Situation   Psych Involvement: No (comment)  Admission diagnosis:  SOB (shortness of breath) [R06.02] Pleural effusion [J90] ESRD on dialysis ([place]) [N18.6, Z99.2] Acute respiratory failure with hypoxia (HCC) [J96.01] Chest pain [R07.9] Dyspnea, unspecified type [R06.00] Patient Active Problem List   Diagnosis Date Noted   Acute respiratory failure with hypoxia (HCC)    Pleural effusion    SOB (shortness of breath)    PNA (pneumonia) 05/31/2022   S/P robot-assisted surgical procedure 10/16/2020   Lung nodules 08/12/2020   Secondary hypercoagulable state ([place]) 10/07/2019   Solitary pulmonary nodule 08/08/2019   Long term (current) use of anticoagulants 04/26/2019   Diarrhea, unspecified 04/20/2019   Near syncope 04/20/2019   NSVT (nonsustained ventricular tachycardia) (  [place]) 04/20/2019   ESRD on hemodialysis ([place]) 04/20/2019   Paroxysmal atrial fibrillation ([place]) 04/20/2019   Hypovolemia 04/20/2019   Coagulation defect, unspecified ([place]) 03/26/2019   Anemia of chronic disease 03/20/2019   Dyspnea, unspecified 03/20/2019   Hyperlipidemia, unspecified 03/20/2019    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 kidney disease, or unspecified chronic kidney disease 03/20/2019   Hypokalemia 03/20/2019   Iron deficiency anemia, unspecified 03/20/2019   Malignant neoplasm of right kidney, except renal pelvis ([place]) 03/20/2019   Secondary hyperparathyroidism of renal origin ([place]) 03/20/2019   Chest pain 03/06/2019   DM2 (diabetes mellitus, type 2) ([place]) 03/06/2019   Obesity (BMI 30-39.9) 03/06/2019   Goals of care, counseling/discussion 01/28/2019   Class 3 severe obesity due to excess calories with serious comorbidity and body mass index (BMI) of 40.0 to 44.9 in adult ([place]) 01/15/2019   Fatigue 01/15/2019   History of ventricular tachycardia 01/15/2019   DKA (diabetic ketoacidoses) 12/19/2018   Newly diagnosed diabetes ([place]) 12/19/2018   Syncope 06/12/2018   Ileus ([place])    SVT (supraventricular tachycardia)    Renal mass, right 05/16/2018   Mass of pancreas    Abnormal CT of the abdomen    Bacteremia due to Enterococcus 04/23/2018   Sepsis ([place]) 04/19/2018   End stage renal disease ([place]) 04/19/2018   Dehydration 04/19/2018   UTI (urinary tract infection): Probable 04/19/2018   Leukocytosis 04/19/2018   Anemia 04/19/2018   Renal cell carcinoma, right ([place]) 04/19/2018   S/P ureteral stent placement: 04/16/2018 04/19/2018   GERD (gastroesophageal reflux disease) 04/19/2018   OSA on CPAP 04/19/2018   HTN (hypertension) 04/19/2018   PCP:  Lorrene Reid, PA-C Pharmacy:   [facility] [address] [place] [address]  [place] [address] Phone: [phone] Fax: [phone]     Social Determinants of Health (SDOH) Interventions    Readmission Risk Interventions    10/19/2020    9:22 AM  Readmission Risk Prevention Plan  Transportation Screening Complete  HRI or Home Care Consult Complete  Social Work Consult for [place] Planning/Counseling Complete  Palliative Care  Screening Not Applicable  Medication Review Press photographer) Complete

## 2022-06-06 NOTE — Progress Notes (Signed)
Contacted by attending that pt has been cleared by heart failure MD for d/c. Plan is to d/c pt today to home. Contacted [place] and spoke to [place], Therapist, sports. Clinic advised pt will d/c today and resume care tomorrow am. Renal NP to send orders to clinic for tomorrow's treatment. Spoke to pt's wife via phone. Wife aware that clinic is expecting pt at his normal appt time tomorrow morning. Wife agreeable to plan.   Melven Sartorius Renal Navigator [phone]

## 2022-06-06 NOTE — Consult Note (Signed)
Advanced Heart Failure Team Consult Note   Primary Physician: Lorrene Reid, PA-C PCP-Cardiologist:  Elouise Munroe, MD  Reason for Consultation: High output on RHC  HPI:    MR. George Lewis is a 66 y/o make with PAF, OSA, stage IV renal cell CA s/p nephrectomy with recently discovered lung metastases and ESRD on HD whom we are asked to see regarding high output on RHC.    Started HD 3 years ago. Initially had AVF in left wrist but this failed immediately. Had HD through  perm cath for a while then established LUE AVF which has worked well since it was placed > 2 years ago.    Reports 2 month h/o mild DOE. Says it got really bad after starting Enlyta about a week ago for his lung CA.    Came to ER with CP and SOB. Hstrop and ECG un remarkable. Echo EF 65% with mild to moderate AS. RV normal size and function. CXR basilar atx    Underwent R/L cath had moderate CAD visually but mid LAD 60 % was hemodynamically significant by FFR so had PCI/stenting   RHC with RA 4 PA mean 19 PCWP 5 PA sat x 1 = 88% (no recheck). CO 20.6 L/min. Thermodilution and shunt run not performed.    Denies h/o significant LE edema or other RV failure symptoms.    Now feels back to baseline   Review of Systems: [y] = yes, '[ ]'  = no   General: Weight gain '[ ]' ; Weight loss '[ ]' ; Anorexia '[ ]' ; Fatigue '[ ]' ; Fever '[ ]' ; Chills '[ ]' ; Weakness '[ ]'   Cardiac: Chest pain/pressure [ y]; Resting SOB [ y]; Exertional SOB [ y]; Orthopnea '[ ]' ; Pedal Edema Blue.Reese ]; Palpitations '[ ]' ; Syncope '[ ]' ; Presyncope '[ ]' ; Paroxysmal nocturnal dyspnea'[ ]'   Pulmonary: Cough '[ ]' ; Wheezing'[ ]' ; Hemoptysis'[ ]' ; Sputum '[ ]' ; Snoring '[ ]'   GI: Vomiting'[ ]' ; Dysphagia'[ ]' ; Melena'[ ]' ; Hematochezia '[ ]' ; Heartburn'[ ]' ; Abdominal pain '[ ]' ; Constipation '[ ]' ; Diarrhea '[ ]' ; BRBPR '[ ]'   GU: Hematuria'[ ]' ; Dysuria '[ ]' ; Nocturia'[ ]'   Vascular: Pain in legs with walking '[ ]' ; Pain in feet with lying flat '[ ]' ; Non-healing sores '[ ]' ; Stroke '[ ]' ; TIA '[ ]' ; Slurred speech '[ ]' ;   Neuro: Headaches'[ ]' ; Vertigo'[ ]' ; Seizures'[ ]' ; Paresthesias'[ ]' ;Blurred vision '[ ]' ; Diplopia '[ ]' ; Vision changes '[ ]'   Ortho/Skin: Arthritis [ y]; Joint pain [ y]; Muscle pain '[ ]' ; Joint swelling '[ ]' ; Back Pain '[ ]' ; Rash '[ ]'   Psych: Depression'[ ]' ; Anxiety'[ ]'   Heme: Bleeding problems '[ ]' ; Clotting disorders '[ ]' ; Anemia '[ ]'   Endocrine: Diabetes '[ ]' ; Thyroid dysfunction'[ ]'   Home Medications Prior to Admission medications   Medication Sig Start Date End Date Taking? Authorizing Provider  allopurinol (ZYLOPRIM) 100 MG tablet Take 1 tablet (100 mg total) by mouth every evening. 04/08/21  Yes Abonza, Herb Grays, PA-C  amiodarone (PACERONE) 200 MG tablet Take 1 tablet (200 mg total) by mouth every evening. 05/09/22  Yes Elouise Munroe, MD  atorvastatin (LIPITOR) 40 MG tablet Take 1 tablet (40 mg total) by mouth every evening. 10/19/20  Yes Gold, Wayne E, PA-C  axitinib (INLYTA) 5 MG tablet Take 1 tablet (5 mg total) by mouth 2 (two) times daily. 04/22/22  Yes Wyatt Portela, MD  diphenhydramine-acetaminophen (TYLENOL PM) 25-500 MG TABS tablet Take 3 tablets by mouth at bedtime as needed (sleep/headache).   Yes [provider]  ferric citrate (AURYXIA) 1 GM 210 MG(Fe) tablet Take 1 tablet (210 mg total) by mouth daily with supper. Patient taking differently: Take 210 mg by mouth 3 (three) times daily with meals. 10/19/20  Yes Gold, Wayne E, PA-C  fluticasone (FLONASE) 50 MCG/ACT nasal spray Place 1 spray into both nostrils daily as needed for allergies or rhinitis.   Yes [provider]  loratadine (CLARITIN) 10 MG tablet Take 10 mg by mouth every evening.    Yes [provider]  Menthol, Topical Analgesic, (BIOFREEZE EX) Apply 1 Application topically as needed (pain).   Yes [provider]  nitroGLYCERIN (NITROSTAT) 0.4 MG SL tablet Place 1 tablet (0.4 mg total) under the tongue every 5 (five) minutes as needed for chest pain. 03/07/19  Yes Swayze, Ava, DO  omeprazole  (PRILOSEC OTC) 20 MG tablet Take 40 mg by mouth daily before breakfast.    Yes [provider]  pembrolizumab (KEYTRUDA) 100 MG/4ML SOLN Inject into the vein every 6 (six) weeks. Infused at providers office 01/07/21  Yes [provider]  prochlorperazine (COMPAZINE) 10 MG tablet Take 1 tablet (10 mg total) by mouth every 6 (six) hours as needed for nausea or vomiting. Patient taking differently: Take 10 mg by mouth as needed for nausea or vomiting. 03/09/22  Yes Shadad, Mathis Dad, MD  Sennosides (SENOKOT EXTRA STRENGTH) 17.2 MG TABS Take 51.6 mg by mouth at bedtime as needed (constipation).   Yes [provider]  traMADol (ULTRAM) 50 MG tablet TAKE 1 TABLET BY MOUTH EVERY 6 HOURS AS NEEDED FOR PAIN Patient taking differently: Take 50 mg by mouth as needed for moderate pain. 02/10/22  Yes Wyatt Portela, MD  warfarin (COUMADIN) 3 MG tablet TAKE 1 TO 2 TABLETS BY MOUTH DAILY AS DIRECTED BY  THE  COUMADIN  CLINIC Patient taking differently: Take 1.5-3 mg by mouth See admin instructions. Take 3 mg by mouth in the evening on Sun/Tues/Wed/Thurs/Sat and 1.5 mg on Mon/Fri 08/12/21  Yes Elouise Munroe, MD  blood glucose meter kit and supplies Dispense based on patient and insurance preference. Use up to four times daily as directed. (FOR ICD-10 E10.9, E11.9). 12/22/18   Danford, Suann Larry, MD  gabapentin (NEURONTIN) 100 MG capsule Take 1 capsule (100 mg total) by mouth 2 (two) times daily. Take 1 PO at night for 5 days then go to twice daily Patient not taking: Reported on 05/31/2022 11/04/20   Melrose Nakayama, MD  glucose blood (ACCU-CHEK GUIDE) test strip USE 1 STRIP TO CHECK FASTING BLOOD SUGAR AND 2 HOURS AFTER LARGEST MEAL 08/02/21   Abonza, Maritza, PA-C  insulin glargine (LANTUS) 100 UNIT/ML injection Inject into the skin daily as needed (low blood glucose 180). Unknow dose to give if needed Patient not taking: Reported on 05/13/2022    [provider]   lidocaine-prilocaine (EMLA) cream Apply topically. Patient not taking: Reported on 05/31/2022 01/11/21   [provider]  midodrine (PROAMATINE) 10 MG tablet Take 1 tablet (10 mg total) by mouth 3 (three) times daily as needed (for hypotension/dialysis). AS ordered. Take In the evening 10 mg on Mon. Wed and Friday Take 20 mg on Dialysis days Tues, Thurs and Saturday. 10 mg when he gets there and 10 mg half way through. Patient not taking: Reported on 05/31/2022 12/03/21   Elouise Munroe, MD    Past Medical History: Past Medical History:  Diagnosis Date   Anemia    Aortic stenosis 05/2022  Arthritis    knees   Atrial fibrillation (HCC)    Bilateral renal cysts 03/23/2018   Noted MRI ABD   CAD (coronary artery disease), native coronary artery 06/03/2022   s/p PCI mLAD   Cancer [facility])    right kidney cancer   Cancer ([place])    pancreatic   Cholelithiasis 03/19/2018   noted on CT AB/Pelvis   Diabetes mellitus without complication ([place])    Diverticulosis of colon 04/19/2018   Noted on CT abd/pelvis   Dysrhythmia    afib   ESRD on hemodialysis [facility])    nephrologist-- dr Hollie Salk [name]);  05-01-2018 has not started dialysis, scheduled for AV fistula creation 05-07-2018   GERD (gastroesophageal reflux disease)    Hepatic steatosis 03/19/2018   Mild diffuse, noted on CT AB/Pelvis   History of kidney stones    History of pulmonary embolus (PE) 03/2008   treated with coumadin for 6 months   History of sepsis 04/20/2018   per discharge note , probable UTI   Hypertension    IBS (irritable bowel syndrome)    Nocturia    OSA on CPAP    uses CPAP nightly   Pancreatic lesion    0.4cm cystic per CT 07/ 2019   Peripheral vascular disease (HCC)    Pneumonia    x 1   Pulmonary nodule    Solid 39m Right Lower Lobe   Right renal mass 04/10/2018   new dx--  scheduled for nephrectomy 05-16-2018   S/P ureteral stent placement: 04/16/2018 04/19/2018    Past Surgical  History: Past Surgical History:  Procedure Laterality Date   AV FISTULA PLACEMENT Left 05/07/2018   Procedure: Creation of Left arm Radiocephalic Fistula;  Surgeon: CMarty Heck MD;  [facility] [facility]  Service: Vascular;  Laterality: Left;   AV FISTULA PLACEMENT Left 05/15/2019   Procedure: Creation of Brachiocephalic fistula left arm;  Surgeon: CWaynetta Sandy MD;  [facility]  Service: Vascular;  Laterality: Left;   [place] 07/15/2019   Procedure: BASILIC VEIN TRANSPOSITION SECOND STAGE;  Surgeon: CWaynetta Sandy MD;  [facility]  Service: Vascular;  Laterality: Left;   BRONCHIAL BRUSHINGS  08/25/2020   Procedure: BRONCHIAL BRUSHINGS;  Surgeon: IGarner Nash DO;  [facility];  Service: Pulmonary;;   BRONCHIAL NEEDLE ASPIRATION BIOPSY  08/25/2020   Procedure: BRONCHIAL NEEDLE ASPIRATION BIOPSIES;  Surgeon: IGarner Nash DO;  [facility];  Service: Pulmonary;;   BRONCHIAL WASHINGS  08/25/2020   Procedure: BRONCHIAL WASHINGS;  Surgeon: IGarner Nash DO;  [facility];  Service: Pulmonary;;   COLONOSCOPY     CORONARY PRESSURE WIRE/FFR WITH 3D MAPPING N/A 06/03/2022   Procedure: Coronary Pressure Wire/FFR w/3D Mapping;  Surgeon: TEarly Osmond MD;  [facility];  Service: Cardiovascular;  Laterality: N/A;   CORONARY STENT INTERVENTION N/A 06/03/2022   Procedure: CORONARY STENT INTERVENTION;  Surgeon: TEarly Osmond MD;  [facility];  Service: Cardiovascular;  Laterality: N/A;   CYSTOSCOPY/RETROGRADE/URETEROSCOPY/STONE EXTRACTION WITH BASKET  x2 last one 1990s approx.   CYSTOSCOPY/URETEROSCOPY/HOLMIUM LASER/STENT PLACEMENT Left 04/16/2018   Procedure: CYSTOSCOPY/LEFT URETEROSCOPY/LEFT RETROGRADE/STENT PLACEMENT;  Surgeon: BLucas Mallow MD;  [facility];  Service: Urology;  Laterality: Left;   EUS N/A 05/03/2018   Procedure: UPPER ENDOSCOPIC  ULTRASOUND (EUS) RADIAL;  Surgeon: JMilus Banister MD;  [facility];  Service: Gastroenterology;  Laterality: N/A;   EUS N/A 05/03/2018  Procedure: UPPER ENDOSCOPIC ULTRASOUND (EUS) LINEAR;  Surgeon: Milus Banister, MD;  [facility];  Service: Gastroenterology;  Laterality: N/A;   EUS  10/05/2020   upper GI   EXTRACORPOREAL SHOCK WAVE LITHOTRIPSY  x3  last one 2004 approx.   FINE NEEDLE ASPIRATION N/A 05/03/2018   Procedure: FINE NEEDLE ASPIRATION (FNA) LINEAR;  Surgeon: Milus Banister, MD;  [facility];  Service: Gastroenterology;  Laterality: N/A;   INTERCOSTAL NERVE BLOCK Left 10/16/2020   Procedure: INTERCOSTAL NERVE BLOCK;  Surgeon: Melrose Nakayama, MD;  Location: [place];  Service: Thoracic;  Laterality: Left;   LAPAROSCOPIC NEPHRECTOMY, HAND ASSISTED Right 05/16/2018   Procedure: HAND ASSISTED LAPAROSCOPIC RIGHT NEPHRECTOMY;  Surgeon: Lucas Mallow, MD;  [facility];  Service: Urology;  Laterality: Right;   left index finger attachment  1980s   3-4 surgeries   NODE DISSECTION Left 10/16/2020   Procedure: NODE DISSECTION;  Surgeon: Melrose Nakayama, MD;  Location: [place];  Service: Thoracic;  Laterality: Left;   RIGHT/LEFT HEART CATH AND CORONARY ANGIOGRAPHY N/A 06/03/2022   Procedure: RIGHT/LEFT HEART CATH AND CORONARY ANGIOGRAPHY;  Surgeon: Early Osmond, MD;  Location: [place] [facility];  Service: Cardiovascular;  Laterality: N/A;   TOE SURGERY  1980s   in beween 2nd and 3rd toes cyst removed   UPPER GI ENDOSCOPY     VIDEO BRONCHOSCOPY WITH ENDOBRONCHIAL NAVIGATION Bilateral 08/25/2020   Procedure: VIDEO BRONCHOSCOPY WITH ENDOBRONCHIAL NAVIGATION;  Surgeon: Garner Nash, DO;  Location: [place];  Service: Pulmonary;  Laterality: Bilateral;    Family History: Family History  Problem Relation Age of Onset   Alzheimer's disease Mother    Alzheimer's disease Father    Heart disease Father    Heart attack Father     Cancer - Other Sister     Social History: Social History   Socioeconomic History   Marital status: Married    Spouse name: Not on file   Number of children: Not on file   Years of education: Not on file   Highest education level: Not on file  Occupational History   Occupation: disabled  Tobacco Use   Smoking status: Never   Smokeless tobacco: Never  Vaping Use   Vaping Use: Never used  Substance and Sexual Activity   Alcohol use: Never   Drug use: Never   Sexual activity: Not on file  Other Topics Concern   Not on file  Social History Narrative   Not on file   Social Determinants of Health   Financial Resource Strain: Unknown (04/19/2018)   Overall Financial Resource Strain (CARDIA)    Difficulty of Paying Living Expenses: Patient refused  Food Insecurity: No Food Insecurity (06/05/2022)   Hunger Vital Sign    Worried About Running Out of Food in the Last Year: Never true    Ran Out of Food in the Last Year: Never true  Transportation Needs: No Transportation Needs (06/05/2022)   PRAPARE - Hydrologist (Medical): No    Lack of Transportation (Non-Medical): No  Physical Activity: Inactive (04/19/2018)   Exercise Vital Sign    Days of Exercise per Week: 0 days    Minutes of Exercise per Session: 0 min  Stress: Not on file  Social Connections: Moderately Integrated (04/19/2018)   Social Connection and Isolation Panel [NHANES]    Frequency of Communication with Friends and Family: More than three times a week    Frequency of Social  Gatherings with Friends and Family: Never    Attends Religious Services: More than 4 times per year    Active Member of Clubs or Organizations: No    Attends Archivist Meetings: Never    Marital Status: Married    Allergies:  Allergies  Allergen Reactions   Penicillins Rash and Other (See Comments)    Has patient had a PCN reaction causing immediate rash, facial/tongue/throat swelling, SOB or  lightheadedness with hypotension: yes Has patient had a PCN reaction causing severe rash involving mucus membranes or skin necrosis: no Has patient had a PCN reaction that required hospitalization: no Has patient had a PCN reaction occurring within the last 10 years: no If all of the above answers are "NO", then may proceed with Cephalosporin use.     Objective:    Vital Signs:   Temp:  [97.7 F (36.5 C)-98.8 F (37.1 C)] 97.7 F (36.5 C) (10/09 0600) Pulse Rate:  [75-86] 75 (10/09 0600) Resp:  [17-20] 17 (10/09 0600) BP: (95-109)/(52-60) 108/60 (10/09 0600) SpO2:  [89 %-97 %] 96 % (10/09 0600) Last BM Date : 06/04/22  Weight change: Filed Weights   06/03/22 0423 06/04/22 0827 06/04/22 1201  Weight: 125.7 kg 128.5 kg 126.4 kg    Intake/Output:   Intake/Output Summary (Last 24 hours) at 06/06/2022 0814 Last data filed at 06/05/2022 0950 Gross per 24 hour  Intake 237 ml  Output --  Net 237 ml      Physical Exam    On exam  Obese male lying flat in bed on nasal pillow CPAP Neck supple no JVD bilateral carotid bruits Cor Regular 2/6 AS. S2 cr isp Lungs decreased at bases o/w clear Ab obese NT/ND. No bruits  Ext warm no edema. Normal sized LUE AVF with thrill  Neuro a &o x 3. Cranial nerves grossly intact. Normal strength all 4 extremities.     Telemetry   Sinus 70-80 Personally reviewed   Labs   Basic Metabolic Panel: Recent Labs  Lab 06/01/22 0109 06/02/22 0111 06/03/22 0041 06/03/22 1550 06/03/22 1602 06/04/22 0140 06/06/22 0129  NA 133* 133* 136 136 135 133* 134*  K 4.6 3.7 3.5 3.7 3.7 3.7 3.4*  CL 101 100 95*  --   --  93* 91*  CO2 16* 19* 24  --   --  20* 25  GLUCOSE 83 108* 94  --   --  92 97  BUN 57* 59* 45*  --   --  60* 67*  CREATININE 10.36* 10.00* 8.03*  --   --  10.75* 11.86*  CALCIUM 7.8* 7.9* 8.7*  --   --  8.2* 8.5*  MG  --  1.6* 2.2  --   --  2.3  --   PHOS  --  5.8* 6.0*  --   --  8.3* 8.4*    Liver Function Tests: Recent  Labs  Lab 06/01/22 0109 06/02/22 0111 06/03/22 0041 06/04/22 0140 06/06/22 0129  AST 17  --   --   --   --   ALT 11  --   --   --   --   ALKPHOS 37*  --   --   --   --   BILITOT 0.5  --   --   --   --   PROT 6.8  --   --   --   --   ALBUMIN 3.2* 3.2* 3.3* 3.2* 3.5   No results for input(s): "LIPASE", "AMYLASE" in  the last 168 hours. No results for input(s): "AMMONIA" in the last 168 hours.  CBC: Recent Labs  Lab 05/31/22 0831 06/01/22 0109 06/02/22 0111 06/03/22 0041 06/03/22 1550 06/03/22 1602 06/04/22 0140 06/05/22 0205 06/06/22 0129  WBC 9.3   < > 8.4 8.4  --   --  8.4 9.0 11.2*  NEUTROABS 6.7  --   --   --   --   --   --   --   --   HGB 11.4*   < > 10.9* 11.8* 11.9* 11.6* 10.8* 11.6* 11.7*  HCT 33.9*   < > 30.8* 34.6* 35.0* 34.0* 31.8* 34.2* 34.7*  MCV 93.4   < > 91.1 91.8  --   --  92.4 93.4 93.5  PLT 236   < > 222 251  --   --  258 277 281   < > = values in this interval not displayed.    Cardiac Enzymes: No results for input(s): "CKTOTAL", "CKMB", "CKMBINDEX", "TROPONINI" in the last 168 hours.  BNP: BNP (last 3 results) Recent Labs    11/20/21 1232 05/31/22 0606  BNP 72.7 462.7*    ProBNP (last 3 results) No results for input(s): "PROBNP" in the last 8760 hours.   CBG: Recent Labs  Lab 06/01/22 1304 06/01/22 1550 06/01/22 2118 06/02/22 0632 06/02/22 1622  GLUCAP 89 121* 101* 98 117*    Coagulation Studies: Recent Labs    06/04/22 0140 06/05/22 0205 06/06/22 0129  LABPROT 17.5* 17.0* 16.1*  INR 1.5* 1.4* 1.3*     Imaging   No results found.   Medications:     Current Medications:  allopurinol  100 mg Oral QPM   amiodarone  200 mg Oral QPM   aspirin  81 mg Oral Daily   atorvastatin  80 mg Oral QPM   calcitRIOL  0.5 mcg Oral Q T,Th,Sa-HD   clopidogrel  75 mg Oral Q breakfast   ferric citrate  210 mg Oral TID WC   loratadine  10 mg Oral QPM   metoprolol succinate  25 mg Oral Daily   sodium chloride flush  3 mL  Intravenous Q12H   Warfarin - Pharmacist Dosing Inpatient   Does not apply q1600    Infusions:  sodium chloride          Assessment/Plan   1. Dyspnea and elevated cardiac output on RHC  - while it is common for patients to have elevated cardiac outputs in setting of active AVF, 20.6 liters/min is far greater than what I would expect giv en the normal size of his AVF unless there is another concomitant process going on such as an intracardiac, intrapulmonary or intraabdominal shunt present. In addition, he has no signs or symptoms of high-output HF and his RV is normal on exam/echo and his PA pressures are much lower than I would expect in a true high-output state. Thus my suspicion is that the calculated Fick cardiac output is likely overestimated/erroneous based on a  single PA saturation measurement.  - ideally we would repeat his RHC with at least two measurements of his PA sat + thermodilution measurements and a full shunt run as needed. If his outputs were truly > 10L/min could perform serial measurements during stepwise compression of his AVF.  - However, given lack of clinical evidence of a high output state and little room to modify his AVF given its normal size, I am not sure that we  will have much to gain by repeating his RHC  at this point. We will plan repeat echo with bubble study in the morning to try to assess for a component of intracardiac shunting and/or to get a sense of the overall magnitude of his peripheral shunting. If the bubble study is impressive we will proceed with repeat RHC otherwise will continue to manage medically.  - of note, ESR of 76 raises concern for possible early amiodarone lung tox icity in the setting of his recent insidious development of dyspnea and would strongly consider a hi-res chest CT and a trial off Amio for now +/- a short course of steroids.   Length of Stay: [place], MD  06/06/2022, 8:14 AM  Advanced Heart Failure Team Pager  [phone] (M-F; 7a - 5p)  Please contact [facility] for night-coverage after hours (4p -7a ) and weekends on [email or web address]

## 2022-06-06 NOTE — Progress Notes (Signed)
ANTICOAGULATION CONSULT NOTE - Follow Up Consult  Pharmacy Consult for warfarin  Indication: atrial fibrillation  Allergies  Allergen Reactions   Penicillins Rash and Other (See Comments)    Has patient had a PCN reaction causing immediate rash, facial/tongue/throat swelling, SOB or lightheadedness with hypotension: yes Has patient had a PCN reaction causing severe rash involving mucus membranes or skin necrosis: no Has patient had a PCN reaction that required hospitalization: no Has patient had a PCN reaction occurring within the last 10 years: no If all of the above answers are "NO", then may proceed with Cephalosporin use.     Patient Measurements: Height: 6\' 1"  (185.4 cm) Weight: 126.4 kg (278 lb 10.6 oz) IBW/kg (Calculated) : 79.9 Heparin Dosing Weight: 109 kg   Vital Signs: Temp: 97.7 F (36.5 C) (10/09 0600) Temp Source: Oral (10/09 0600) BP: 129/75 (10/09 0839) Pulse Rate: 80 (10/09 0839)  Labs: Recent Labs    06/04/22 0140 06/05/22 0205 06/06/22 0129  HGB 10.8* 11.6* 11.7*  HCT 31.8* 34.2* 34.7*  PLT 258 277 281  LABPROT 17.5* 17.0* 16.1*  INR 1.5* 1.4* 1.3*  CREATININE 10.75*  --  11.86*     Estimated Creatinine Clearance: 8.9 mL/min (A) (by C-G formula based on SCr of 11.86 mg/dL (H)).   Medications:  Scheduled:   allopurinol  100 mg Oral QPM   amiodarone  200 mg Oral QPM   aspirin  81 mg Oral Daily   atorvastatin  80 mg Oral QPM   calcitRIOL  0.5 mcg Oral Q T,Th,Sa-HD   Chlorhexidine Gluconate Cloth  6 each Topical Q0600   clopidogrel  75 mg Oral Q breakfast   ferric citrate  210 mg Oral TID WC   loratadine  10 mg Oral QPM   metoprolol succinate  25 mg Oral Daily   sodium chloride flush  3 mL Intravenous Q12H   Warfarin - Pharmacist Dosing Inpatient   Does not apply q1600    Assessment: Pt is a 19 yoM with PMH significant for RCC, afib (on warfarin), ESRD on HD TTS, T2DM. Pt admitted with SOB - concern for pericarditis, PNA. Pharmacy  consulted to dose warfarin. Home dose: 3mg  daily except for 1.5mg  on Mon and Fri  INR is subtherapeutic today at 1.3 (dose was held in 10/5).  CBC stable. No signs or symptoms of bleeding noted.   Goal of Therapy:  INR 2-3 Monitor platelets by anticoagulation protocol: Yes   Plan:  Give warfarin 3 mg tonight Check daily INR Monitor CBC and s/sx of bleeding  Antonietta Jewel, PharmD, BCCCP Clinical Pharmacist  Phone: [phone] 06/06/2022 11:34 AM  Please check AMION for all [place] phone numbers After 10:00 PM, call [place] [phone]

## 2022-06-06 NOTE — Progress Notes (Signed)
   06/06/22 1454  Mobility  Activity Ambulated independently in hallway  Level of Assistance Independent  Assistive Device None  Distance Ambulated (ft) 500 ft  Mobility Referral Yes  $Mobility charge 1 Mobility   Mobility Specialist Progress Note  Pre-Mobility: 92% SpO2 During Mobility: 93% SpO2 Post-Mobility: 94% SpO2  Pt was in bed and agreeable. Had some c/o overall weakness. Returned to bed w/ all needs met and call bell in reach  [name] Mobility Specialist

## 2022-06-06 NOTE — Progress Notes (Addendum)
[name] Progress Note   Subjective:    Seen and examined patient at bedside. Patient's wife also at bedside. He reports CP "comes and goes" and mild SOB. Noted bedside 2D ECHO done this morning-awaiting results. Plan for possible RHC today. Plan for HD 10/10. Noted K+ 3.4 today, will give KCL 58meq X 1.  Objective Vitals:   06/05/22 1735 06/05/22 1923 06/06/22 0600 06/06/22 0839  BP: (!) 109/54 (!) 104/54 108/60 129/75  Pulse: 81 83 75 80  Resp: 18 20 17    Temp: 98.8 F (37.1 C) 97.7 F (36.5 C) 97.7 F (36.5 C)   TempSrc: Oral Oral Oral   SpO2: 96%  96%   Weight:      Height:       Physical Exam General:WDWN male in NAD; on nasal CPAP Heart:RRR, no mrg Lungs:CTAB, nml WOB on RA Abdomen:soft, NTND Extremities:no LE edema Dialysis Access: LU AVF +b/t   Filed Weights   06/03/22 0423 06/04/22 0827 06/04/22 1201  Weight: 125.7 kg 128.5 kg 126.4 kg   No intake or output data in the 24 hours ending 06/06/22 0952  Additional Objective Labs: Basic Metabolic Panel: Recent Labs  Lab 06/03/22 0041 06/03/22 1550 06/03/22 1602 06/04/22 0140 06/06/22 0129  NA 136   < > 135 133* 134*  K 3.5   < > 3.7 3.7 3.4*  CL 95*  --   --  93* 91*  CO2 24  --   --  20* 25  GLUCOSE 94  --   --  92 97  BUN 45*  --   --  60* 67*  CREATININE 8.03*  --   --  10.75* 11.86*  CALCIUM 8.7*  --   --  8.2* 8.5*  PHOS 6.0*  --   --  8.3* 8.4*   < > = values in this interval not displayed.   Liver Function Tests: Recent Labs  Lab 06/01/22 0109 06/02/22 0111 06/03/22 0041 06/04/22 0140 06/06/22 0129  AST 17  --   --   --   --   ALT 11  --   --   --   --   ALKPHOS 37*  --   --   --   --   BILITOT 0.5  --   --   --   --   PROT 6.8  --   --   --   --   ALBUMIN 3.2*   < > 3.3* 3.2* 3.5   < > = values in this interval not displayed.   No results for input(s): "LIPASE", "AMYLASE" in the last 168 hours. CBC: Recent Labs  Lab 05/31/22 0831 06/01/22 0109 06/02/22 0111  06/03/22 0041 06/03/22 1550 06/04/22 0140 06/05/22 0205 06/06/22 0129  WBC 9.3   < > 8.4 8.4  --  8.4 9.0 11.2*  NEUTROABS 6.7  --   --   --   --   --   --   --   HGB 11.4*   < > 10.9* 11.8*   < > 10.8* 11.6* 11.7*  HCT 33.9*   < > 30.8* 34.6*   < > 31.8* 34.2* 34.7*  MCV 93.4   < > 91.1 91.8  --  92.4 93.4 93.5  PLT 236   < > 222 251  --  258 277 281   < > = values in this interval not displayed.   Blood Culture    Component Value Date/Time   SDES  11/20/2021 1620  BLOOD RIGHT ANTECUBITAL Performed at [facility] [address], [place], [place] [id number]    SPECREQUEST  11/20/2021 1620    BOTTLES DRAWN AEROBIC AND ANAEROBIC Blood Culture adequate volume Performed at [facility] [address] [place], [place] [address]    CULT  11/20/2021 1620    NO GROWTH 5 DAYS Performed at [facility] [address], [place], [place] [address]    REPTSTATUS 11/25/2021 FINAL 11/20/2021 1620    Cardiac Enzymes: No results for input(s): "CKTOTAL", "CKMB", "CKMBINDEX", "TROPONINI" in the last 168 hours. CBG: Recent Labs  Lab 06/01/22 1304 06/01/22 1550 06/01/22 2118 06/02/22 0632 06/02/22 1622  GLUCAP 89 121* 101* 98 117*   Iron Studies: No results for input(s): "IRON", "TIBC", "TRANSFERRIN", "FERRITIN" in the last 72 hours. Lab Results  Component Value Date   INR 1.3 (H) 06/06/2022   INR 1.4 (H) 06/05/2022   INR 1.5 (H) 06/04/2022   Studies/Results: No results found.  Medications:  sodium chloride      allopurinol  100 mg Oral QPM   amiodarone  200 mg Oral QPM   aspirin  81 mg Oral Daily   atorvastatin  80 mg Oral QPM   calcitRIOL  0.5 mcg Oral Q T,Th,Sa-HD   clopidogrel  75 mg Oral Q breakfast   ferric citrate  210 mg Oral TID WC   loratadine  10 mg Oral QPM   metoprolol succinate  25 mg Oral Daily   sodium chloride flush  3 mL Intravenous Q12H   Warfarin - Pharmacist Dosing Inpatient   Does not apply q1600     Dialysis Orders: T[facility] 4h 450/ 600   130.9kg  3K/2.5bath  P2  Hep none  LUA AVF - last HD 9/30, post wt 131.2kg - rocaltrol 0.5 mcg tiw po - last Hb 11.4, no esa   CXR - poor film, IMPRESSION: 1. Mild right base opacity with low lung volumes favoring atelectasis. 2. Chronic cardiomegaly. BP 155/ 75, HR 80-90, RR 17- 25, afeb, 4L [place] CTA chest - no PE, mild bilat effusions w/ mild adjacent bilat atx vs consolidation  Assessment/Plan: Chest pain - trops negative, EKG unremarkable. CTA no PE.  Cath with 60% mild LAD s/p stenting. No other significant disease requiring intervention.  Pressures found during cath indicate patient not volume overloaded. Bedside 2D ECHO done this morning-awaiting results. SOB - CXR w/ vasc congestion. Is now 4-5 under the prior dry wt, (will lower on d/c ~126kg) but pt still SOB w/ exertion. Echo showed ok EF. cath w/mild disease s/p stenting of LAD, pressures indicate not currently volume overloaded.  Patient continues to have dyspnea w/exertion.  His CO/ CI are both extremely abnormally high by cath, and his DOE symptoms have only partially improved w/ vol reduction.  He may have high output HF.  Dr. Jonnie Lewis spoke with Dr. Haroldine Lewis who concurred and recommends repeat RHC with AVF manipulation on Monday. ESRD - on HD TTS. Next HD 06/07/22. K+ today 3.4, will give KCL 66meq X 1, checking labs in AM. Will switch to 3K bath for tomorrow. 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  MBD ckd - CCa in range, cont vdra po w/ HD. Phos elevated, continue binders.  DM2 - on insulin, per pmd  Nutrition - renal diet w/fluid restriction   George Poet, NP [name] 06/06/2022,9:52 AM  LOS: 6 days

## 2022-06-06 NOTE — Progress Notes (Signed)
  Echocardiogram 2D Echocardiogram has been performed.  George Lewis 06/06/2022, 9:02 AM

## 2022-06-06 NOTE — Progress Notes (Signed)
Patient resting comfortably on CPAP as this time.

## 2022-06-06 NOTE — Consult Note (Addendum)
Advanced Heart Failure Team Consult Note   Primary Physician: Lorrene Reid, PA-C PCP-Cardiologist:  Elouise Munroe, MD  Reason for Consultation: Dyspnea and elevated cardiac output on RHC   HPI:    George Lewis. is seen today for evaluation of dyspnea and elevated cardiac output on RHC at the request of Dr. Jonnie Finner, nephrology.   66 y/o make with PAF, OSA, stage IV renal cell CA s/p nephrectomy with recently discovered lung metastases and ESRD on HD whom we are asked to see regarding high output on RHC.    Started HD 3 years ago. Initially had AVF in left wrist but this failed immediately. Had HD through  perm cath for a while then established LUE AVF which has worked well since it was placed > 2 years ago.    Reports 2 month h/o mild DOE. Says it got really bad after starting [place] about a week ago for his lung CA.    Came to ER with CP and SOB. Hstrop and ECG un remarkable. Echo EF 65% with mild to moderate AS. RV normal size and function. CXR basilar atx    Underwent R/L cath had moderate CAD visually but mid LAD 60 % was hemodynamically significant by FFR so had PCI/stenting   RHC with RA 4 PA mean 19 PCWP 5 PA sat x 1 = 88% (no recheck). CO 20.6 L/min. Thermodilution and shunt run not performed.    Denies h/o significant LE edema or other RV failure symptoms.    Now feels back to baseline.    2D Echo 06/01/22 Left ventricular ejection fraction, by estimation, is 55 to 60%. The left ventricle has normal function. The left ventricle has no regional wall motion abnormalities. Left ventricular diastolic parameters are consistent with Grade II diastolic dysfunction (pseudonormalization). 1. Right ventricular systolic function is normal. The right ventricular size is normal. Tricuspid regurgitation signal is inadequate for assessing PA pressure. 2. 3. Left atrial size was mildly dilated. 4. Right atrial size was mildly dilated. The mitral valve is normal in  structure. Mild mitral valve regurgitation. No evidence of mitral stenosis. 5. The aortic valve is tricuspid. There is moderate calcification of the aortic valve. Aortic valve regurgitation is not visualized. Mild to moderate aortic valve stenosis. Aortic valve area, by VTI measures 1.44 cm. Aortic valve mean gradient measures 18.0 mmHg. 6. Aortic dilatation noted. There is mild dilatation of the ascending aorta, measuring 41 mm. 7. 8. IVC not well-visualized   R/LHC 06/03/22   Prox LAD to Mid LAD lesion is 60% stenosed.   2nd Diag lesion is 75% stenosed.   Ost LAD lesion is 30% stenosed.   1st Diag lesion is 70% stenosed.   A stent was successfully placed.   Post intervention, there is a 0% residual stenosis.   1.  RFR positive proximal to mid LAD lesion treated with 1 drug-eluting stent with mild to moderate disease elsewhere (that should be treated medically). 2.  Cardiac output of 20.5 L/min and cardiac index of 8.29 L/min/m due to presence of AV fistula. 3.  Low normal filling pressures with mean RA of 4 mmHg, mean wedge pressure of 5 mmHg, and mean PA pressure of 19 mmHg with LVEDP of 10 mmHg.  Echo Bubble Study: pending   Review of Systems: [y] = yes, '[ ]'  = no   General: Weight gain '[ ]' ; Weight loss '[ ]' ; Anorexia '[ ]' ; Fatigue '[ ]' ; Fever '[ ]' ; Chills '[ ]' ; Weakness '[ ]'   Cardiac: Chest pain/pressure [Y ]; Resting SOB '[ ]' ; Exertional SOB [ Y]; Orthopnea '[ ]' ; Pedal Edema '[ ]' ; Palpitations '[ ]' ; Syncope '[ ]' ; Presyncope '[ ]' ; Paroxysmal nocturnal dyspnea'[ ]'   Pulmonary: Cough '[ ]' ; Wheezing'[ ]' ; Hemoptysis'[ ]' ; Sputum '[ ]' ; Snoring '[ ]'   GI: Vomiting'[ ]' ; Dysphagia'[ ]' ; Melena'[ ]' ; Hematochezia '[ ]' ; Heartburn'[ ]' ; Abdominal pain '[ ]' ; Constipation '[ ]' ; Diarrhea '[ ]' ; BRBPR '[ ]'   GU: Hematuria'[ ]' ; Dysuria '[ ]' ; Nocturia'[ ]'   Vascular: Pain in legs with walking '[ ]' ; Pain in feet with lying flat '[ ]' ; Non-healing sores '[ ]' ; Stroke '[ ]' ; TIA '[ ]' ; Slurred speech '[ ]' ;  Neuro: Headaches'[ ]' ; Vertigo'[ ]' ;  Seizures'[ ]' ; Paresthesias'[ ]' ;Blurred vision '[ ]' ; Diplopia '[ ]' ; Vision changes '[ ]'   Ortho/Skin: Arthritis '[ ]' ; Joint pain '[ ]' ; Muscle pain '[ ]' ; Joint swelling '[ ]' ; Back Pain '[ ]' ; Rash '[ ]'   Psych: Depression'[ ]' ; Anxiety'[ ]'   Heme: Bleeding problems '[ ]' ; Clotting disorders '[ ]' ; Anemia '[ ]'   Endocrine: Diabetes '[ ]' ; Thyroid dysfunction'[ ]'   Home Medications Prior to Admission medications   Medication Sig Start Date End Date Taking? Authorizing Provider  allopurinol (ZYLOPRIM) 100 MG tablet Take 1 tablet (100 mg total) by mouth every evening. 04/08/21  Yes Abonza, Herb Grays, PA-C  amiodarone (PACERONE) 200 MG tablet Take 1 tablet (200 mg total) by mouth every evening. 05/09/22  Yes Elouise Munroe, MD  atorvastatin (LIPITOR) 40 MG tablet Take 1 tablet (40 mg total) by mouth every evening. 10/19/20  Yes Gold, Wayne E, PA-C  axitinib (INLYTA) 5 MG tablet Take 1 tablet (5 mg total) by mouth 2 (two) times daily. 04/22/22  Yes Wyatt Portela, MD  diphenhydramine-acetaminophen (TYLENOL PM) 25-500 MG TABS tablet Take 3 tablets by mouth at bedtime as needed (sleep/headache).   Yes [provider]  ferric citrate (AURYXIA) 1 GM 210 MG(Fe) tablet Take 1 tablet (210 mg total) by mouth daily with supper. Patient taking differently: Take 210 mg by mouth 3 (three) times daily with meals. 10/19/20  Yes Gold, Wayne E, PA-C  fluticasone (FLONASE) 50 MCG/ACT nasal spray Place 1 spray into both nostrils daily as needed for allergies or rhinitis.   Yes [provider]  loratadine (CLARITIN) 10 MG tablet Take 10 mg by mouth every evening.    Yes [provider]  Menthol, Topical Analgesic, (BIOFREEZE EX) Apply 1 Application topically as needed (pain).   Yes [provider]  nitroGLYCERIN (NITROSTAT) 0.4 MG SL tablet Place 1 tablet (0.4 mg total) under the tongue every 5 (five) minutes as needed for chest pain. 03/07/19  Yes Swayze, Ava, DO  omeprazole (PRILOSEC OTC) 20 MG tablet Take 40 mg by  mouth daily before breakfast.    Yes [provider]  pembrolizumab (KEYTRUDA) 100 MG/4ML SOLN Inject into the vein every 6 (six) weeks. Infused at providers office 01/07/21  Yes [provider]  prochlorperazine (COMPAZINE) 10 MG tablet Take 1 tablet (10 mg total) by mouth every 6 (six) hours as needed for nausea or vomiting. Patient taking differently: Take 10 mg by mouth as needed for nausea or vomiting. 03/09/22  Yes Shadad, Mathis Dad, MD  Sennosides (SENOKOT EXTRA STRENGTH) 17.2 MG TABS Take 51.6 mg by mouth at bedtime as needed (constipation).   Yes [provider]  traMADol (ULTRAM) 50 MG tablet TAKE 1 TABLET BY MOUTH EVERY 6 HOURS AS NEEDED FOR PAIN Patient taking differently: Take 50 mg by mouth  as needed for moderate pain. 02/10/22  Yes Wyatt Portela, MD  warfarin (COUMADIN) 3 MG tablet TAKE 1 TO 2 TABLETS BY MOUTH DAILY AS DIRECTED BY  THE  COUMADIN  CLINIC Patient taking differently: Take 1.5-3 mg by mouth See admin instructions. Take 3 mg by mouth in the evening on Sun/Tues/Wed/Thurs/Sat and 1.5 mg on Mon/Fri 08/12/21  Yes Elouise Munroe, MD  blood glucose meter kit and supplies Dispense based on patient and insurance preference. Use up to four times daily as directed. (FOR ICD-10 E10.9, E11.9). 12/22/18   Danford, Suann Larry, MD  gabapentin (NEURONTIN) 100 MG capsule Take 1 capsule (100 mg total) by mouth 2 (two) times daily. Take 1 PO at night for 5 days then go to twice daily Patient not taking: Reported on 05/31/2022 11/04/20   Melrose Nakayama, MD  glucose blood (ACCU-CHEK GUIDE) test strip USE 1 STRIP TO CHECK FASTING BLOOD SUGAR AND 2 HOURS AFTER LARGEST MEAL 08/02/21   Abonza, Maritza, PA-C  insulin glargine (LANTUS) 100 UNIT/ML injection Inject into the skin daily as needed (low blood glucose 180). Unknow dose to give if needed Patient not taking: Reported on 05/13/2022    [provider]  lidocaine-prilocaine (EMLA) cream Apply  topically. Patient not taking: Reported on 05/31/2022 01/11/21   [provider]  midodrine (PROAMATINE) 10 MG tablet Take 1 tablet (10 mg total) by mouth 3 (three) times daily as needed (for hypotension/dialysis). AS ordered. Take In the evening 10 mg on Mon. Wed and Friday Take 20 mg on Dialysis days Tues, Thurs and Saturday. 10 mg when he gets there and 10 mg half way through. Patient not taking: Reported on 05/31/2022 12/03/21   Elouise Munroe, MD    Past Medical History: Past Medical History:  Diagnosis Date   Anemia    Aortic stenosis 05/2022   Arthritis    knees   Atrial fibrillation ([place])    Bilateral renal cysts 03/23/2018   Noted MRI ABD   CAD (coronary artery disease), native coronary artery 06/03/2022   s/p PCI mLAD   Cancer [facility])    right kidney cancer   Cancer ([place])    pancreatic   Cholelithiasis 03/19/2018   noted on CT AB/Pelvis   Diabetes mellitus without complication ([place])    Diverticulosis of colon 04/19/2018   Noted on CT abd/pelvis   Dysrhythmia    afib   ESRD on hemodialysis [facility])    nephrologist-- dr Hollie Salk [name]);  05-01-2018 has not started dialysis, scheduled for AV fistula creation 05-07-2018   GERD (gastroesophageal reflux disease)    Hepatic steatosis 03/19/2018   Mild diffuse, noted on CT AB/Pelvis   History of kidney stones    History of pulmonary embolus (PE) 03/2008   treated with coumadin for 6 months   History of sepsis 04/20/2018   per discharge note , probable UTI   Hypertension    IBS (irritable bowel syndrome)    Nocturia    OSA on CPAP    uses CPAP nightly   Pancreatic lesion    0.4cm cystic per CT 07/ 2019   Peripheral vascular disease (H[facility])    Pneumonia    x 1   Pulmonary nodule    Solid 76m Right Lower Lobe   Right renal mass 04/10/2018   new dx--  scheduled for nephrectomy 05-16-2018   S/P ureteral stent placement: 04/16/2018 04/19/2018    Past Surgical History: Past Surgical History:  Procedure  Laterality Date  AV FISTULA PLACEMENT Left 05/07/2018   Procedure: Creation of Left arm Radiocephalic Fistula;  Surgeon: Marty Heck, MD;  Location: [place];  Service: Vascular;  Laterality: Left;   AV FISTULA PLACEMENT Left 05/15/2019   Procedure: Creation of Brachiocephalic fistula left arm;  Surgeon: Waynetta Sandy, MD;  Location: [place];  Service: Vascular;  Laterality: Left;   [place] Left 07/15/2019   Procedure: BASILIC VEIN TRANSPOSITION SECOND STAGE;  Surgeon: Waynetta Sandy, MD;  Location: [place];  Service: Vascular;  Laterality: Left;   BRONCHIAL BRUSHINGS  08/25/2020   Procedure: BRONCHIAL BRUSHINGS;  Surgeon: Garner Nash, DO;  Location: [place] [facility];  Service: Pulmonary;;   BRONCHIAL NEEDLE ASPIRATION BIOPSY  08/25/2020   Procedure: BRONCHIAL NEEDLE ASPIRATION BIOPSIES;  Surgeon: Garner Nash, DO;  Location: [place] [facility];  Service: Pulmonary;;   BRONCHIAL WASHINGS  08/25/2020   Procedure: BRONCHIAL WASHINGS;  Surgeon: Garner Nash, DO;  Location: [place] [facility];  Service: Pulmonary;;   COLONOSCOPY     CORONARY PRESSURE WIRE/FFR WITH 3D MAPPING N/A 06/03/2022   Procedure: Coronary Pressure Wire/FFR w/3D Mapping;  Surgeon: Early Osmond, MD;  Location: [place] [facility];  Service: Cardiovascular;  Laterality: N/A;   CORONARY STENT INTERVENTION N/A 06/03/2022   Procedure: CORONARY STENT INTERVENTION;  Surgeon: Early Osmond, MD;  Location: [place] CV [facility];  Service: Cardiovascular;  Laterality: N/A;   CYSTOSCOPY/RETROGRADE/URETEROSCOPY/STONE EXTRACTION WITH BASKET  x2 last one 1990s approx.   CYSTOSCOPY/URETEROSCOPY/HOLMIUM LASER/STENT PLACEMENT Left 04/16/2018   Procedure: CYSTOSCOPY/LEFT URETEROSCOPY/LEFT RETROGRADE/STENT PLACEMENT;  Surgeon: Lucas Mallow, MD;  [facility]: [facility];  Service: Urology;  Laterality: Left;   EUS N/A 05/03/2018   Procedure: UPPER ENDOSCOPIC ULTRASOUND (EUS) RADIAL;  Surgeon: Milus Banister,  MD;  Location[facility];  Service: Gastroenterology;  Laterality: N/A;   EUS N/A 05/03/2018   Procedure: UPPER ENDOSCOPIC ULTRASOUND (EUS) LINEAR;  Surgeon: Milus Banister, MD;  [facility];  Service: Gastroenterology;  Laterality: N/A;   EUS  10/05/2020   upper GI   EXTRACORPOREAL SHOCK WAVE LITHOTRIPSY  x3  last one 2004 approx.   FINE NEEDLE ASPIRATION N/A 05/03/2018   Procedure: FINE NEEDLE ASPIRATION (FNA) LINEAR;  Surgeon: Milus Banister, MD;  [facility];  Service: Gastroenterology;  Laterality: N/A;   INTERCOSTAL NERVE BLOCK Left 10/16/2020   Procedure: INTERCOSTAL NERVE BLOCK;  Surgeon: Melrose Nakayama, MD;  Location: [place];  Service: Thoracic;  Laterality: Left;   LAPAROSCOPIC NEPHRECTOMY, HAND ASSISTED Right 05/16/2018   Procedure: HAND ASSISTED LAPAROSCOPIC RIGHT NEPHRECTOMY;  Surgeon: Lucas Mallow, MD;  [facility];  Service: Urology;  Laterality: Right;   left index finger attachment  1980s   3-4 surgeries   NODE DISSECTION Left 10/16/2020   Procedure: NODE DISSECTION;  Surgeon: Melrose Nakayama, MD;  Location: [place];  Service: Thoracic;  Laterality: Left;   RIGHT/LEFT HEART CATH AND CORONARY ANGIOGRAPHY N/A 06/03/2022   Procedure: RIGHT/LEFT HEART CATH AND CORONARY ANGIOGRAPHY;  Surgeon: Early Osmond, MD;  Location: [place] [facility];  Service: Cardiovascular;  Laterality: N/A;   TOE SURGERY  1980s   in beween 2nd and 3rd toes cyst removed   UPPER GI ENDOSCOPY     VIDEO BRONCHOSCOPY WITH ENDOBRONCHIAL NAVIGATION Bilateral 08/25/2020   Procedure: VIDEO BRONCHOSCOPY WITH ENDOBRONCHIAL NAVIGATION;  Surgeon: Garner Nash, DO;  Location: [place];  Service: Pulmonary;  Laterality: Bilateral;    Family History: Family History  Problem Relation  Age of Onset   Alzheimer's disease Mother    Alzheimer's disease Father    Heart disease Father    Heart attack Father    Cancer - Other Sister     Social History: Social  History   Socioeconomic History   Marital status: Married    Spouse name: Not on file   Number of children: Not on file   Years of education: Not on file   Highest education level: Not on file  Occupational History   Occupation: disabled  Tobacco Use   Smoking status: Never   Smokeless tobacco: Never  Vaping Use   Vaping Use: Never used  Substance and Sexual Activity   Alcohol use: Never   Drug use: Never   Sexual activity: Not on file  Other Topics Concern   Not on file  Social History Narrative   Not on file   Social Determinants of Health   Financial Resource Strain: Unknown (04/19/2018)   Overall Financial Resource Strain (CARDIA)    Difficulty of Paying Living Expenses: Patient refused  Food Insecurity: No Food Insecurity (06/05/2022)   Hunger Vital Sign    Worried About Running Out of Food in the Last Year: Never true    Ran Out of Food in the Last Year: Never true  Transportation Needs: No Transportation Needs (06/05/2022)   PRAPARE - Hydrologist (Medical): No    Lack of Transportation (Non-Medical): No  Physical Activity: Inactive (04/19/2018)   Exercise Vital Sign    Days of Exercise per Week: 0 days    Minutes of Exercise per Session: 0 min  Stress: Not on file  Social Connections: Moderately Integrated (04/19/2018)   Social Connection and Isolation Panel [NHANES]    Frequency of Communication with Friends and Family: More than three times a week    Frequency of Social Gatherings with Friends and Family: Never    Attends Religious Services: More than 4 times per year    Active Member of [employer] or Organizations: No    Attends Archivist Meetings: Never    Marital Status: Married    Allergies:  Allergies  Allergen Reactions   Penicillins Rash and Other (See Comments)    Has patient had a PCN reaction causing immediate rash, facial/tongue/throat swelling, SOB or lightheadedness with hypotension: yes Has patient had a PCN  reaction causing severe rash involving mucus membranes or skin necrosis: no Has patient had a PCN reaction that required hospitalization: no Has patient had a PCN reaction occurring within the last 10 years: no If all of the above answers are "NO", then may proceed with Cephalosporin use.     Objective:    Vital Signs:   Temp:  [97.7 F (36.5 C)-98.8 F (37.1 C)] 97.7 F (36.5 C) (10/09 0600) Pulse Rate:  [75-86] 75 (10/09 0600) Resp:  [17-20] 17 (10/09 0600) BP: (95-109)/(52-60) 108/60 (10/09 0600) SpO2:  [89 %-97 %] 96 % (10/09 0600) Last BM Date : 06/04/22  Weight change: Filed Weights   06/03/22 0423 06/04/22 0827 06/04/22 1201  Weight: 125.7 kg 128.5 kg 126.4 kg    Intake/Output:   Intake/Output Summary (Last 24 hours) at 06/06/2022 0752 Last data filed at 06/05/2022 0950 Gross per 24 hour  Intake 237 ml  Output --  Net 237 ml      Physical Exam    General:  Well appearing, obese middle aged male. No resp difficulty HEENT: normal Neck: supple. Thick neck, JVD not well  visualized . Carotids 2+ bilat; no bruits. No lymphadenopathy or thyromegaly appreciated. Cor: PMI nondisplaced. Regular rate & rhythm. 2/6 AS murmur  Lungs: CTAB Abdomen: soft, nontender, nondistended. No hepatosplenomegaly. No bruits or masses. Good bowel sounds. Extremities: no cyanosis, clubbing, rash, edema, + LUE fistula w/ palpable thrill  Neuro: alert & orientedx3, cranial nerves grossly intact. moves all 4 extremities w/o difficulty. Affect pleasant   Telemetry   NSR 70s, personally reviewed   EKG    N/A  Labs   Basic Metabolic Panel: Recent Labs  Lab 06/01/22 0109 06/02/22 0111 06/03/22 0041 06/03/22 1550 06/03/22 1602 06/04/22 0140 06/06/22 0129  NA 133* 133* 136 136 135 133* 134*  K 4.6 3.7 3.5 3.7 3.7 3.7 3.4*  CL 101 100 95*  --   --  93* 91*  CO2 16* 19* 24  --   --  20* 25  GLUCOSE 83 108* 94  --   --  92 97  BUN 57* 59* 45*  --   --  60* 67*  CREATININE  10.36* 10.00* 8.03*  --   --  10.75* 11.86*  CALCIUM 7.8* 7.9* 8.7*  --   --  8.2* 8.5*  MG  --  1.6* 2.2  --   --  2.3  --   PHOS  --  5.8* 6.0*  --   --  8.3* 8.4*    Liver Function Tests: Recent Labs  Lab 06/01/22 0109 06/02/22 0111 06/03/22 0041 06/04/22 0140 06/06/22 0129  AST 17  --   --   --   --   ALT 11  --   --   --   --   ALKPHOS 37*  --   --   --   --   BILITOT 0.5  --   --   --   --   PROT 6.8  --   --   --   --   ALBUMIN 3.2* 3.2* 3.3* 3.2* 3.5   No results for input(s): "LIPASE", "AMYLASE" in the last 168 hours. No results for input(s): "AMMONIA" in the last 168 hours.  CBC: Recent Labs  Lab 05/31/22 0831 06/01/22 0109 06/02/22 0111 06/03/22 0041 06/03/22 1550 06/03/22 1602 06/04/22 0140 06/05/22 0205 06/06/22 0129  WBC 9.3   < > 8.4 8.4  --   --  8.4 9.0 11.2*  NEUTROABS 6.7  --   --   --   --   --   --   --   --   HGB 11.4*   < > 10.9* 11.8* 11.9* 11.6* 10.8* 11.6* 11.7*  HCT 33.9*   < > 30.8* 34.6* 35.0* 34.0* 31.8* 34.2* 34.7*  MCV 93.4   < > 91.1 91.8  --   --  92.4 93.4 93.5  PLT 236   < > 222 251  --   --  258 277 281   < > = values in this interval not displayed.    Cardiac Enzymes: No results for input(s): "CKTOTAL", "CKMB", "CKMBINDEX", "TROPONINI" in the last 168 hours.  BNP: BNP (last 3 results) Recent Labs    11/20/21 1232 05/31/22 0606  BNP 72.7 462.7*    ProBNP (last 3 results) No results for input(s): "PROBNP" in the last 8760 hours.   CBG: Recent Labs  Lab 06/01/22 1304 06/01/22 1550 06/01/22 2118 06/02/22 0632 06/02/22 1622  GLUCAP 89 121* 101* 98 117*    Coagulation Studies: Recent Labs    06/04/22 0140 06/05/22 0205 06/06/22 0129  LABPROT 17.5* 17.0* 16.1*  INR 1.5* 1.4* 1.3*     Imaging   No results found.   Medications:     Current Medications:  allopurinol  100 mg Oral QPM   amiodarone  200 mg Oral QPM   aspirin  81 mg Oral Daily   atorvastatin  80 mg Oral QPM   calcitRIOL  0.5 mcg  Oral Q T,Th,Sa-HD   clopidogrel  75 mg Oral Q breakfast   ferric citrate  210 mg Oral TID WC   loratadine  10 mg Oral QPM   metoprolol succinate  25 mg Oral Daily   sodium chloride flush  3 mL Intravenous Q12H   Warfarin - Pharmacist Dosing Inpatient   Does not apply q1600    Infusions:  sodium chloride        Patient Profile   66 y/o male with PAF on amiodarone, OSA, stage IV renal cell CA s/p nephrectomy with recently discovered lung metastases and ESRD on HD, admitted for CP and SOB. Echo EF 65% with mild to moderate AS. RV normal size and function. LHC w/ hemodynamically significant mLAD lesion by RFR testing, s/p PCI + DES + 70% mid 1st diag managed medically. RHC w/ low normal filling pressures and high cardiac output of 20.5 L/min and cardiac index of 8.29 L/min/m due to presence of AV fistula. Of note, ESR also elevated 78 mm/hr, in the setting of long term amiodarone use for Afib.   AHF team consulted for evaluation of dyspnea and elevated cardiac output on RHC.  Assessment/Plan   1. Dyspnea and elevated cardiac output on RHC  - while it is common for patients to have elevated cardiac outputs in setting of active AVF, 20.6 liters/min is far greater than what I would expect given the normal size of his AVF unless there is another concomitant process going on such as an intracardiac, intrapulmonary or intraabdominal shunt present. In addition, he has no signs or symptoms of high-output HF and his RV is normal on exam/echo and his PA pressures are much lower than I would expect in a true high-output state. Thus my suspicion is that the calculated Fick cardiac output is likely overestimated/erroneous based on a  single PA saturation measurement.  - ideally we would repeat his RHC with at least two measurements of his PA sat + thermodilution measurements and a full shunt run as needed. If his outputs were truly > 10L/min could perform serial measurements during stepwise compression of  his AVF.  - However, given lack of clinical evidence of a high output state and little room to modify his AVF given its normal size, I am not sure that we  will have much to gain by repeating his RHC at this point. We will plan repeat echo with bubble study today to try to assess for a component of intracardiac shunting and/or to get a sense of the overall magnitude of his peripheral shunting. If the bubble study is impressive we will proceed with repeat RHC, otherwise will continue to manage medically.  - of note, ESR of 76 raises concern for possible early amiodarone lung tox icity in the setting of his recent insidious development of dyspnea and would strongly consider a hi-res chest CT and a trial off Amio for now +/- a short course of steroids.   2. CAD  - LHC 60% mLAD, RFR positive suggesting hemodynamically significant lesion. S/p PCI + DES - residual 70% mid 1st Diag and 75% prox 2nd Diag management  medically - DAPT w/ ASA + Plavix - on ? blocker + high intensity statin   3. Aortic Stenosis - mild-mod on echo, mGradient 18.0 mmHg. EF 55-60% - will need annual echocardiograms for surveillance   4. PAF - in NSR - on amiodarone + warfarin  - w/ elevated ESR and dyspnea, ? Amiodarone toxicity. Consider a hi-res chest CT and a trial off Amio for now +/- a short course of steroids.   5. ESRD - on HD T/Th/Sat - per nephrology   6. RCC w/ lung Mets - followed by Dr. Alen Blew   7. ? CAP - per primary team   8. Type 2DM - per primary team     Length of Stay: [address], PA-C  06/06/2022, 7:52 AM  Advanced Heart Failure Team Pager [phone] (M-F; 7a - 5p)  Please contact [facility] for night-coverage after hours (4p -7a ) and weekends on [email or web address]  Patient seen and examined with the above-signed Advanced Practice Provider and/or Housestaff. I personally reviewed laboratory data, imaging studies and relevant notes. I independently examined the patient and formulated  the important aspects of the plan. I have edited the note to reflect any of my changes or salient points. I have personally discussed the plan with the patient and/or family.  Echo with bubble study today showed normal LV/RV size and fucntion. Bubble study negative.   Denies SOB, orthopnea or PND.   General:  Well appearing. No resp difficulty HEENT: normal Neck: supple. no JVD. Carotids 2+ bilat; no bruits. No lymphadenopathy or thryomegaly appreciated. Cor: PMI nondisplaced. Regular rate & rhythm. No rubs, gallops or murmurs. Lungs: clear Abdomen: obese soft, nontender, nondistended. No hepatosplenomegaly. No bruits or masses. Good bowel sounds. Extremities: no cyanosis, clubbing, rash, edema  LUE AVF Neuro: alert & orientedx3, cranial nerves grossly intact. moves all 4 extremities w/o difficulty. Affect pleasant  Will hold off on repeating [place] for now based on previous discussion. I located a previous VBG and saw that O2 sat on VBG was 82% which confirms high output but given lack of clinical findings suggestive of high-output failure will continue to manage medically.   I reviewed 2 recent chest CTs with Radiology. Low suspicion for amio toxicity. Will repeat ESR.   OK for d/c from a HF standpoint. We will see in f/u.  Glori Bickers, MD  10:33 AM   Addendum:   ESR still elevated @ 79. Would recommend stopping amio for now.   Glori Bickers, MD  2:34 PM

## 2022-06-07 ENCOUNTER — Ambulatory Visit: Payer: Medicare Other

## 2022-06-07 ENCOUNTER — Telehealth: Payer: Self-pay

## 2022-06-07 NOTE — TOC Transition Note (Signed)
Transition of care contact from inpatient facility  Date of discharge: 06/06/22 Date of contact: 06/07/22 Method: Attempted Phone Call Spoke to: No Answer  Tried calling patient contacted to discuss transition of care from recent inpatient hospitalization but he did not pick up the phone. A voicemail was called to reach back out to [facility] HD unit [phone] for any questions or concerns.  Patient scheduled for HD at [facility] today. Next HD 06/20/22.  Tobie Poet, NP

## 2022-06-07 NOTE — Patient Outreach (Signed)
  Care Coordination TOC Note Transition Care Management Unsuccessful Follow-up Telephone Call  Date of discharge and from where:  [facility]-06/06/22  Attempts:  1st Attempt  Reason for unsuccessful TCM follow-up call:  Unable to leave message  [name], RN, BSN, CCM Care Management Coordinator [facility] Phone: [phone]: [phone]

## 2022-06-08 ENCOUNTER — Telehealth: Payer: Self-pay

## 2022-06-08 NOTE — Patient Outreach (Signed)
  Care Coordination TOC Note Transition Care Management Unsuccessful Follow-up Telephone Call  Date of discharge and from where:  [facility]/22  Attempts:  3rd Attempt  Reason for unsuccessful TCM follow-up call:  No answer/busy

## 2022-06-08 NOTE — Discharge Summary (Signed)
Physician Discharge Summary   Patient: George Lewis. MRN: [id number] DOB: [birth year]  Admit date:     05/31/2022  Discharge date: 06/06/2022  Discharge Physician: Berle Mull  PCP: Lorrene Reid, PA-C  Recommendations at discharge:  Follow up with Cardiology as recommended   Follow-up Information     Lorrene Reid, PA-C. Call .   Specialty: Physician Assistant Why: Follow up from ER visit Contact information: [place]. [address] [place] [address] [phone]         [facility]-EMERGENCY DEPT. Go to .   Specialty: Emergency Medicine Why: As needed, If symptoms worsen Contact information: [place] [id number] [place] [address] [phone]        Bensimhon, Shaune Pascal, MD Follow up on 07/05/2022.   Specialty: Cardiology Why: at 8:40. Located at [facility] 1st floor. Entrance C. Free valet parking. Contact information: [address] [place] [address] [phone]                Discharge Diagnoses: Principal Problem:   Chest pain Active Problems:   Renal cell carcinoma, right (HCC)   HTN (hypertension)   DM2 (diabetes mellitus, type 2) ([place])   ESRD on hemodialysis (HCC)   Paroxysmal atrial fibrillation (HCC)   Anemia of chronic disease   Hyperlipidemia, unspecified   Malignant neoplasm of right kidney, except renal pelvis (HCC)   PNA (pneumonia)   Acute respiratory failure with hypoxia (HCC)   Pleural effusion   SOB (shortness of breath)  Hospital Course: George Bohlken. is a 66 y.o. male with medical history significant of RCC, A fib on coumadin, HLD, ESRD on HD TTS, DM2.  Presented to the hospital with complaints of progressive worsening shortness of breath ongoing for last 7 days after starting to take new medication for his RCC INLYTA. Cardiology and nephrology consulted. Underwent cardiac catheterization on 10/6. Due to patient's ongoing  numbness or shortness of breath as well as significantly high right-heart cath pressures advanced heart failure services was consulted.  Patient currently undergoing bubble study tomorrow. Assessment and Plan  Acute on chronic diastolic CHF. Paroxysmal A-fib with RVR. Small right pleural effusion with atelectasis. Patient presents with complaints of shortness of breath.  This started immediately after him starting INLYTA. Symptoms progressively worsen without any swelling of the legs or weight gain to the point that he was not able to go to hemodialysis at which point he was brought to the ER. Treated with HD with improvement in symptoms. Echocardiogram shows preserved EF.  No pericardial effusion.  No significant valvular or wall motion abnormalities as well. On exam has faint basilar crackles more on the right side. For now continue volume removal with HD. Continue anticoagulation. Nephrology consulted advanced heart failure team as there is concern for high-output failure in an HD patient. Bubble study done, no plan for repeating right heart Cardiac Catheterization. [place] [facility] pt can be discharged with close follow up.  Due to elevated ESR there was some concern for amiodarone toxicity and so his amio was stopped on discharge per Cardiology.    Chest pain. CAD, multivessel There was some concern for pleuritic chest pain which might be due to pericarditis. Started on colchicine although discontinued now as less likely pericarditis. Underwent cardiac catheterization on 10/6.   proximal to mid LAD lesion treated with 1 DES. Management per cardiology. Currently recommend 81 mg aspirin for 1 month and then stop. Recommends Plavix 75 mg  daily along with warfarin. Continue Toprol-XL 25 mg daily and Lipitor 80 mg daily.   Concern for community-acquired pneumonia-ruled out Oncology does not think the patient has Keytruda induced pneumonitis. Discontinue antibiotics.   Type 2 diabetes  mellitus, diet controlled with renal complication. Well-controlled 5.3 hemoglobin A1c. Continue current modified diet.   RCC. Follows up with Dr. Alen Blew. Monitor.  Currently holding off on Inlyta.   HLD. Continue statin.   ESRD on HD on TTS schedule. Nephrology consult for HD Appreciate assistance. Transfer from [place] to [facility] for HD needs.   Obesity Body mass index is 36.76 kg/m.  Placing the pt at higher risk of poor outcomes.   OSA on CPAP. Continue CPAP nightly per home regimen.   Consultants:  Nephrology  Cardiology   Procedures performed:  Cardiac Catheterization  Echocardiogram  Bubble study Echocardiogram  DISCHARGE MEDICATION: Allergies as of 06/06/2022       Reactions   Penicillins Rash, Other (See Comments)   Has patient had a PCN reaction causing immediate rash, facial/tongue/throat swelling, SOB or lightheadedness with hypotension: yes Has patient had a PCN reaction causing severe rash involving mucus membranes or skin necrosis: no Has patient had a PCN reaction that required hospitalization: no Has patient had a PCN reaction occurring within the last 10 years: no If all of the above answers are "NO", then may proceed with Cephalosporin use.        Medication List     STOP taking these medications    amiodarone 200 MG tablet Commonly known as: PACERONE       TAKE these medications    Accu-Chek Guide test strip Generic drug: glucose blood USE 1 STRIP TO CHECK FASTING BLOOD SUGAR AND 2 HOURS AFTER LARGEST MEAL   allopurinol 100 MG tablet Commonly known as: ZYLOPRIM Take 1 tablet (100 mg total) by mouth every evening.   aspirin EC 81 MG tablet Take 1 tablet (81 mg total) by mouth daily. Swallow whole.   atorvastatin 40 MG tablet Commonly known as: LIPITOR Take 1 tablet (40 mg total) by mouth every evening.   axitinib 5 MG tablet Commonly known as: INLYTA Take 1 tablet (5 mg total) by mouth 2 (two) times daily. HOLD UNTIL SEEN  BY ONCOLOGY What changed: additional instructions   BIOFREEZE EX Apply 1 Application topically as needed (pain).   blood glucose meter kit and supplies Dispense based on patient and insurance preference. Use up to four times daily as directed. (FOR ICD-10 E10.9, E11.9).   calcitRIOL 0.5 MCG capsule Commonly known as: ROCALTROL Take 1 capsule (0.5 mcg total) by mouth Every Tuesday,Thursday,and Saturday with dialysis.   clopidogrel 75 MG tablet Commonly known as: PLAVIX Take 1 tablet (75 mg total) by mouth daily with breakfast.   diphenhydramine-acetaminophen 25-500 MG Tabs tablet Commonly known as: TYLENOL PM Take 3 tablets by mouth at bedtime as needed (sleep/headache).   ferric citrate 1 GM 210 MG(Fe) tablet Commonly known as: Auryxia Take 1 tablet (210 mg total) by mouth daily with supper. What changed: when to take this   fluticasone 50 MCG/ACT nasal spray Commonly known as: FLONASE Place 1 spray into both nostrils daily as needed for allergies or rhinitis.   gabapentin 100 MG capsule Commonly known as: Neurontin Take 1 capsule (100 mg total) by mouth 2 (two) times daily. Take 1 PO at night for 5 days then go to twice daily   insulin glargine 100 UNIT/ML injection Commonly known as: LANTUS Inject into the skin daily  as needed (low blood glucose 180). Unknow dose to give if needed   Keytruda 100 MG/4ML Soln Generic drug: pembrolizumab Inject into the vein every 6 (six) weeks. Infused at providers office   lidocaine-prilocaine cream Commonly known as: EMLA Apply topically.   loratadine 10 MG tablet Commonly known as: CLARITIN Take 10 mg by mouth every evening.   metoprolol succinate 25 MG 24 hr tablet Commonly known as: TOPROL-XL Take 1 tablet (25 mg total) by mouth daily.   midodrine 10 MG tablet Commonly known as: PROAMATINE Take 1 tablet (10 mg total) by mouth 3 (three) times daily as needed (for hypotension/dialysis). AS ordered. Take In the evening 10 mg  on Mon. Wed and Friday Take 20 mg on Dialysis days Tues, Thurs and Saturday. 10 mg when he gets there and 10 mg half way through.   nitroGLYCERIN 0.4 MG SL tablet Commonly known as: NITROSTAT Place 1 tablet (0.4 mg total) under the tongue every 5 (five) minutes as needed for chest pain.   omeprazole 20 MG tablet Commonly known as: PRILOSEC OTC Take 40 mg by mouth daily before breakfast.   prochlorperazine 10 MG tablet Commonly known as: COMPAZINE Take 1 tablet (10 mg total) by mouth every 6 (six) hours as needed for nausea or vomiting. What changed: when to take this   Senokot Extra Strength 17.2 MG Tabs Generic drug: Sennosides Take 51.6 mg by mouth at bedtime as needed (constipation).   traMADol 50 MG tablet Commonly known as: ULTRAM TAKE 1 TABLET BY MOUTH EVERY 6 HOURS AS NEEDED FOR PAIN What changed:  when to take this reasons to take this additional instructions   warfarin 3 MG tablet Commonly known as: COUMADIN Take as directed. If you are unsure how to take this medication, talk to your nurse or doctor. Original instructions: TAKE 1 TO 2 TABLETS BY MOUTH DAILY AS DIRECTED BY  THE  COUMADIN  CLINIC What changed:  how much to take how to take this when to take this additional instructions       Disposition: Home Diet recommendation: Cardiac diet  Discharge Exam: Vitals:   06/05/22 1923 06/06/22 0600 06/06/22 0839 06/06/22 1426  BP: (!) 104/54 108/60 129/75 104/63  Pulse: 83 75 80 75  Resp: '20 17  19  ' Temp: 97.7 F (36.5 C) 97.7 F (36.5 C)  98.4 F (36.9 C)  TempSrc: Oral Oral  Oral  SpO2:  96%    Weight:      Height:       General: Appear in mild distress; no visible Abnormal Neck Mass Or lumps, Conjunctiva normal Cardiovascular: S1 and S2 Present, no Murmur, Respiratory: good respiratory effort, Bilateral Air entry present and CTA, no Crackles, no wheezes Abdomen: Bowel Sound present, Non tender  Extremities: no Pedal edema Neurology: alert and  oriented to time, place, and person  Filed Weights   06/03/22 0423 06/04/22 0827 06/04/22 1201  Weight: 125.7 kg 128.5 kg 126.4 kg   Condition at discharge: stable  The results of significant diagnostics from this hospitalization (including imaging, microbiology, ancillary and laboratory) are listed below for reference.   Imaging Studies: ECHOCARDIOGRAM LIMITED BUBBLE STUDY  Result Date: 06/06/2022    ECHOCARDIOGRAM LIMITED REPORT   Patient Name:   George Garlitz. Date of Exam: 06/06/2022 Medical Rec #:  [id number]             Height:       73.0 in Accession #:    [id number]  Weight:       278.7 lb Date of Birth:  12-Oct-1955             BSA:          2.477 m Patient Age:    49 years              BP:           129/75 mmHg Patient Gender: M                     HR:           73 bpm. Exam Location:  Inpatient Procedure: Limited Echo and Saline Contrast Bubble Study Indications:    PFO  History:        Patient has prior history of Echocardiogram examinations, most                 recent 06/01/2022.  Sonographer:    Harvie Junior Referring Phys: [id number] DANIEL R BENSIMHON  Sonographer Comments: Patient is obese. Image acquisition challenging due to patient body habitus. IMPRESSIONS  1. Left ventricular ejection fraction, by estimation, is 60 to 65%. The left ventricle has normal function.  2. Right ventricular systolic function is normal. The right ventricular size is normal.  3. Agitated saline contrast bubble study was negative, with no evidence of any early (intracardiac) or late (peripheral) intrapulmonary shunting seen.  4. Limited study for bubble study only. FINDINGS  Left Ventricle: Left ventricular ejection fraction, by estimation, is 60 to 65%. The left ventricle has normal function. Right Ventricle: The right ventricular size is normal. Right ventricular systolic function is normal. IAS/Shunts: No atrial level shunt detected by color flow Doppler. Agitated saline contrast was given  intravenously to evaluate for intracardiac shunting. Agitated saline contrast bubble study was negative, with no evidence of any interatrial shunt. George Bickers MD Electronically signed by George Bickers MD Signature Date/Time: 06/06/2022/10:29:51 AM    Final    CARDIAC CATHETERIZATION  Result Date: 06/03/2022   Prox LAD to Mid LAD lesion is 60% stenosed.   2nd Diag lesion is 75% stenosed.   Ost LAD lesion is 30% stenosed.   1st Diag lesion is 70% stenosed.   A stent was successfully placed.   Post intervention, there is a 0% residual stenosis. 1.  RFR positive proximal to mid LAD lesion treated with 1 drug-eluting stent with mild to moderate disease elsewhere (that should be treated medically). 2.  Cardiac output of 20.5 L/min and cardiac index of 8.29 L/min/m due to presence of AV fistula. 3.  Low normal filling pressures with mean RA of 4 mmHg, mean wedge pressure of 5 mmHg, and mean PA pressure of 19 mmHg with LVEDP of 10 mmHg. Recommendation: Dual antiplatelet therapy for preferably 1 year.  Plavix was administered rather than ticagrelor given the lack of an acute coronary syndrome and given the patient's ongoing dyspnea of uncertain etiology.   DG Chest 2 View  Result Date: 06/02/2022 CLINICAL DATA:  Shortness of breath EXAM: CHEST - 2 VIEW COMPARISON:  05/31/2022 FINDINGS: Stable cardiomegaly. Low lung volumes. Trace left-sided pleural effusion with associated left basilar opacity. No pneumothorax. IMPRESSION: Stable radiographic appearance of the chest with trace left-sided pleural effusion and associated left basilar opacity. Electronically Signed   By: Davina Poke D.O.   On: 06/02/2022 15:34   ECHOCARDIOGRAM COMPLETE  Result Date: 06/01/2022    ECHOCARDIOGRAM REPORT   Patient Name:   George Henslee. Date of  Exam: 06/01/2022 Medical Rec #:  [id number]             Height:       73.0 in Accession #:    [id number]            Weight:       293.4 lb Date of Birth:  12/15/1955              BSA:          2.532 m Patient Age:    67 years              BP:           141/78 mmHg Patient Gender: M                     HR:           95 bpm. Exam Location:  Inpatient Procedure: 2D Echo, Cardiac Doppler, Color Doppler and Intracardiac            Opacification Agent Indications:    Other abnormalities of the heart R00.8  History:        Patient has prior history of Echocardiogram examinations, most                 recent 11/26/2018. Arrythmias:Atrial Fibrillation,                 Signs/Symptoms:Shortness of Breath and Chest Pain; Risk                 Factors:Dyslipidemia, Diabetes and Hypertension.  Sonographer:    Greer Pickerel Referring Phys: [id number] Jonnie Finner  Sonographer Comments: Technically challenging study due to limited acoustic windows, Technically difficult study due to poor echo windows, suboptimal apical window, no subcostal window and patient is obese. Image acquisition challenging due to respiratory motion. IMPRESSIONS  1. Left ventricular ejection fraction, by estimation, is 55 to 60%. The left ventricle has normal function. The left ventricle has no regional wall motion abnormalities. Left ventricular diastolic parameters are consistent with Grade II diastolic dysfunction (pseudonormalization).  2. Right ventricular systolic function is normal. The right ventricular size is normal. Tricuspid regurgitation signal is inadequate for assessing PA pressure.  3. Left atrial size was mildly dilated.  4. Right atrial size was mildly dilated.  5. The mitral valve is normal in structure. Mild mitral valve regurgitation. No evidence of mitral stenosis.  6. The aortic valve is tricuspid. There is moderate calcification of the aortic valve. Aortic valve regurgitation is not visualized. Mild to moderate aortic valve stenosis. Aortic valve area, by VTI measures 1.44 cm. Aortic valve mean gradient measures  18.0 mmHg.  7. Aortic dilatation noted. There is mild dilatation of the ascending aorta, measuring  41 mm.  8. IVC not well-visualized . FINDINGS  Left Ventricle: Left ventricular ejection fraction, by estimation, is 55 to 60%. The left ventricle has normal function. The left ventricle has no regional wall motion abnormalities. Definity contrast agent was given IV to delineate the left ventricular  endocardial borders. The left ventricular internal cavity size was normal in size. There is no left ventricular hypertrophy. Left ventricular diastolic parameters are consistent with Grade II diastolic dysfunction (pseudonormalization). Right Ventricle: The right ventricular size is normal. No increase in right ventricular wall thickness. Right ventricular systolic function is normal. Tricuspid regurgitation signal is inadequate for assessing PA pressure. Left Atrium: Left atrial size was mildly dilated. Right Atrium: Right atrial size was mildly dilated. Pericardium: There  is no evidence of pericardial effusion. Mitral Valve: The mitral valve is normal in structure. There is mild calcification of the mitral valve leaflet(s). Mild mitral annular calcification. Mild mitral valve regurgitation. No evidence of mitral valve stenosis. Tricuspid Valve: The tricuspid valve is normal in structure. Tricuspid valve regurgitation is not demonstrated. Aortic Valve: The aortic valve is tricuspid. There is moderate calcification of the aortic valve. Aortic valve regurgitation is not visualized. Mild to moderate aortic stenosis is present. Aortic valve mean gradient measures 18.0 mmHg. Aortic valve peak gradient measures 29.1 mmHg. Aortic valve area, by VTI measures 1.44 cm. Pulmonic Valve: The pulmonic valve was normal in structure. Pulmonic valve regurgitation is not visualized. Aorta: The aortic root is normal in size and structure and aortic dilatation noted. There is mild dilatation of the ascending aorta, measuring 41 mm. Venous: The inferior vena cava was not well visualized. IAS/Shunts: No atrial level shunt detected by color  flow Doppler.  LEFT VENTRICLE PLAX 2D LVIDd:         5.40 cm   Diastology LVIDs:         3.80 cm   LV e' medial:    7.51 cm/s LV PW:         1.50 cm   LV E/e' medial:  10.4 LV IVS:        1.10 cm   LV e' lateral:   6.42 cm/s LVOT diam:     2.17 cm   LV E/e' lateral: 12.2 LV SV:         63 LV SV Index:   25 LVOT Area:     3.70 cm  RIGHT VENTRICLE RV S prime:     11.00 cm/s TAPSE (M-mode): 2.0 cm LEFT ATRIUM             Index        RIGHT ATRIUM           Index LA diam:        4.60 cm 1.82 cm/m   RA Area:     39.50 cm LA Vol (A2C):   63.6 ml 25.12 ml/m  RA Volume:   144.00 ml 56.86 ml/m LA Vol (A4C):   76.9 ml 30.37 ml/m LA Biplane Vol: 74.0 ml 29.22 ml/m  AORTIC VALVE AV Area (Vmax):    1.31 cm AV Area (Vmean):   1.27 cm AV Area (VTI):     1.44 cm AV Vmax:           269.50 cm/s AV Vmean:          192.500 cm/s AV VTI:            0.440 m AV Peak Grad:      29.1 mmHg AV Mean Grad:      18.0 mmHg LVOT Vmax:         95.60 cm/s LVOT Vmean:        65.900 cm/s LVOT VTI:          0.171 m LVOT/AV VTI ratio: 0.39  AORTA Ao Root diam: 3.40 cm Ao Asc diam:  4.05 cm MITRAL VALVE MV Area (PHT): 5.38 cm    SHUNTS MV Decel Time: 141 msec    Systemic VTI:  0.17 m MV E velocity: 78.30 cm/s  Systemic Diam: 2.17 cm MV A velocity: 80.60 cm/s MV E/A ratio:  0.97 Dalton McleanMD Electronically signed by Franki Monte Signature Date/Time: 06/01/2022/3:43:42 PM    Final    CT Angio Chest PE W and/or Wo  Contrast  Result Date: 05/31/2022 CLINICAL DATA:  Cough, dyspnea, fatigue, fever arthralgias. History of renal cell EXAM: CT ANGIOGRAPHY CHEST WITH CONTRAST TECHNIQUE: Multidetector CT imaging of the chest was performed using the standard protocol during bolus administration of intravenous contrast. Multiplanar CT image reconstructions and MIPs were obtained to evaluate the vascular anatomy. RADIATION DOSE REDUCTION: This exam was performed according to the departmental dose-optimization program which includes automated exposure  control, adjustment of the mA and/or kV according to patient size and/or use of iterative reconstruction technique. CONTRAST:  40m OMNIPAQUE IOHEXOL 350 MG/ML SOLN COMPARISON:  CT chest 04/17/2022 FINDINGS: Cardiovascular: There is adequate opacification of the pulmonary arteries to the segmental level. There is no evidence of pulmonary embolism. The heart size is stable. There is no pericardial effusion. Aortic valve and coronary artery calcifications are again seen. The thoracic aorta is normal in appearance. Mediastinum/Nodes: The thyroid is unremarkable. The esophagus is grossly unremarkable. A 2.0 cm posterior mediastinal lymph node is not significantly changed in size. There is no new or progressive mediastinal lymphadenopathy. There is no hilar or axillary lymphadenopathy. Lungs/Pleura: The trachea and central airways are patent. Lung volumes are low. A small left pleural effusion and adjacent airspace opacity are unchanged since the CT chest from 04/20/2022. Consolidation and architectural distortion in the left upper lobe along the major fissure are unchanged. There is a new small right pleural effusion with adjacent consolidation. The 0.9 cm nodule in the left upper lobe is unchanged (6-89). Additional nodules in the right lower lobe are not seen on the current study, obscured by consolidation. Upper Abdomen: The imaged portions of the upper abdominal viscera are unremarkable. Musculoskeletal: There is no acute osseous abnormality or suspicious osseous lesion. Review of the MIP images confirms the above findings. IMPRESSION: 1. No evidence of pulmonary embolism. 2. New small right pleural effusion with adjacent consolidation. Pneumonia is not excluded. 3. Unchanged left pleural effusion and adjacent airspace opacity/volume loss. Unchanged consolidation and architectural distortion in the left upper lobe along the major fissure. 4. Unchanged left upper lobe pulmonary nodule measuring 0.9 cm. Additional  right lower lobe nodules are obscured by the consolidation and not well assessed. 5. Unchanged 2.0 cm mediastinal lymph node. Electronically Signed   By: PValetta MoleM.D.   On: 05/31/2022 10:57   DG Chest 2 View  Result Date: 05/31/2022 CLINICAL DATA:  Lung cancer.  Increasing shortness of breath EXAM: CHEST - 2 VIEW COMPARISON:  11/20/2021 FINDINGS: Low volume chest. Cardiomegaly and vascular pedicle widening. There is no edema, air bronchogram, effusion, or pneumothorax. Chronic blunting at the lateral left costophrenic sulcus which is likely fatty based on comparison CT. Streaky indistinct density at the right lung base. IMPRESSION: 1. Mild right base opacity with low lung volumes favoring atelectasis. 2. Chronic cardiomegaly. Electronically Signed   By: JJorje GuildM.D.   On: 05/31/2022 06:03    Microbiology: Results for orders placed or performed during the hospital encounter of 05/31/22  Resp Panel by RT-PCR (Flu A&B, Covid) Anterior Nasal Swab     Status: None   Collection Time: 05/31/22  6:06 AM   Specimen: Anterior Nasal Swab  Result Value Ref Range Status   SARS Coronavirus 2 by RT PCR NEGATIVE NEGATIVE Final    Comment: (NOTE) SARS-CoV-2 target nucleic acids are NOT DETECTED.  The SARS-CoV-2 RNA is generally detectable in upper respiratory specimens during the acute phase of infection. The lowest concentration of SARS-CoV-2 viral copies this assay can detect is 138 copies/mL.  A negative result does not preclude SARS-Cov-2 infection and should not be used as the sole basis for treatment or other patient management decisions. A negative result may occur with  improper specimen collection/handling, submission of specimen other than nasopharyngeal swab, presence of viral mutation(s) within the areas targeted by this assay, and inadequate number of viral copies(<138 copies/mL). A negative result must be combined with clinical observations, patient history, and  epidemiological information. The expected result is Negative.  Fact Sheet for Patients:  EntrepreneurPulse.com.au  Fact Sheet for Healthcare Providers:  IncredibleEmployment.be  This test is no t yet approved or cleared by the [place] FDA and  has been authorized for detection and/or diagnosis of SARS-CoV-2 by FDA under an Emergency Use Authorization (EUA). This EUA will remain  in effect (meaning this test can be used) for the duration of the COVID-19 declaration under Section 564(b)(1) of the Act, 21 U.S.C.section 360bbb-3(b)(1), unless the authorization is terminated  or revoked sooner.       Influenza A by PCR NEGATIVE NEGATIVE Final   Influenza B by PCR NEGATIVE NEGATIVE Final    Comment: (NOTE) The Xpert Xpress SARS-CoV-2/FLU/RSV plus assay is intended as an aid in the diagnosis of influenza from Nasopharyngeal swab specimens and should not be used as a sole basis for treatment. Nasal washings and aspirates are unacceptable for Xpert Xpress SARS-CoV-2/FLU/RSV testing.  Fact Sheet for Patients: EntrepreneurPulse.com.au  Fact Sheet for Healthcare Providers: IncredibleEmployment.be  This test is not yet approved or cleared by the [place] FDA and has been authorized for detection and/or diagnosis of SARS-CoV-2 by FDA under an Emergency Use Authorization (EUA). This EUA will remain in effect (meaning this test can be used) for the duration of the COVID-19 declaration under Section 564(b)(1) of the Act, 21 U.S.C. section 360bbb-3(b)(1), unless the authorization is terminated or revoked.  Performed at [facility], [place] [address] [place], [place] [address]    Labs: CBC: Recent Labs  Lab 06/02/22 0111 06/03/22 0041 06/03/22 1550 06/03/22 1602 06/04/22 0140 06/05/22 0205 06/06/22 0129  WBC 8.4 8.4  --   --  8.4 9.0 11.2*  HGB 10.9* 11.8* 11.9* 11.6* 10.8* 11.6*  11.7*  HCT 30.8* 34.6* 35.0* 34.0* 31.8* 34.2* 34.7*  MCV 91.1 91.8  --   --  92.4 93.4 93.5  PLT 222 251  --   --  258 277 681   Basic Metabolic Panel: Recent Labs  Lab 06/02/22 0111 06/03/22 0041 06/03/22 1550 06/03/22 1602 06/04/22 0140 06/06/22 0129  NA 133* 136 136 135 133* 134*  K 3.7 3.5 3.7 3.7 3.7 3.4*  CL 100 95*  --   --  93* 91*  CO2 19* 24  --   --  20* 25  GLUCOSE 108* 94  --   --  92 97  BUN 59* 45*  --   --  60* 67*  CREATININE 10.00* 8.03*  --   --  10.75* 11.86*  CALCIUM 7.9* 8.7*  --   --  8.2* 8.5*  MG 1.6* 2.2  --   --  2.3  --   PHOS 5.8* 6.0*  --   --  8.3* 8.4*   Liver Function Tests: Recent Labs  Lab 06/02/22 0111 06/03/22 0041 06/04/22 0140 06/06/22 0129  ALBUMIN 3.2* 3.3* 3.2* 3.5   CBG: Recent Labs  Lab 06/01/22 2118 06/02/22 0632 06/02/22 1622  GLUCAP 101* 98 117*    Discharge time spent: greater than 30 minutes.  Signed: Diamantina Providence  Posey Pronto, MD [facility] 06/06/2022

## 2022-06-08 NOTE — Patient Outreach (Signed)
  Care Coordination TOC Note Transition Care Management Unsuccessful Follow-up Telephone Call  Date of discharge and from where:  [facility] 05/31/22-06/06/22  Attempts:  2nd Attempt  Reason for unsuccessful TCM follow-up call:  No answer/busy  [name], RN, BSN, CCM Care Management Coordinator [facility] Phone: [phone]: [phone]

## 2022-06-10 NOTE — Progress Notes (Unsigned)
Cardiology Clinic Note   Patient Name: George Lewis. Date of Encounter: 06/13/2022  Primary Care Provider:  Lorrene Reid, PA-C Primary Cardiologist:  Elouise Munroe, MD  Patient Profile    George Lewis. 66 year old male presents the clinic today for follow-up evaluation of his dyspnea.  Past Medical History    Past Medical History:  Diagnosis Date   Anemia    Aortic stenosis 05/2022   Arthritis    knees   Atrial fibrillation ([place])    Bilateral renal cysts 03/23/2018   Noted MRI ABD   CAD (coronary artery disease), native coronary artery 06/03/2022   s/p PCI mLAD   Cancer [facility])    right kidney cancer   Cancer ([place])    pancreatic   Cholelithiasis 03/19/2018   noted on CT AB/Pelvis   Diabetes mellitus without complication ([place])    Diverticulosis of colon 04/19/2018   Noted on CT abd/pelvis   Dysrhythmia    afib   ESRD on hemodialysis [facility])    nephrologist-- dr Hollie Salk [name]);  05-01-2018 has not started dialysis, scheduled for AV fistula creation 05-07-2018   GERD (gastroesophageal reflux disease)    Hepatic steatosis 03/19/2018   Mild diffuse, noted on CT AB/Pelvis   History of kidney stones    History of pulmonary embolus (PE) 03/2008   treated with coumadin for 6 months   History of sepsis 04/20/2018   per discharge note , probable UTI   Hypertension    IBS (irritable bowel syndrome)    Nocturia    OSA on CPAP    uses CPAP nightly   Pancreatic lesion    0.4cm cystic per CT 07/ 2019   Peripheral vascular disease (HCC)    Pneumonia    x 1   Pulmonary nodule    Solid 67mm Right Lower Lobe   Right renal mass 04/10/2018   new dx--  scheduled for nephrectomy 05-16-2018   S/P ureteral stent placement: 04/16/2018 04/19/2018   Past Surgical History:  Procedure Laterality Date   AV FISTULA PLACEMENT Left 05/07/2018   Procedure: Creation of Left arm Radiocephalic Fistula;  Surgeon: Marty Heck, MD;  Location: [place];   Service: Vascular;  Laterality: Left;   AV FISTULA PLACEMENT Left 05/15/2019   Procedure: Creation of Brachiocephalic fistula left arm;  Surgeon: Waynetta Sandy, MD;  Location: [place];  Service: Vascular;  Laterality: Left;   [place] Left 07/15/2019   Procedure: BASILIC VEIN TRANSPOSITION SECOND STAGE;  Surgeon: Waynetta Sandy, MD;  Location: [place];  Service: Vascular;  Laterality: Left;   BRONCHIAL BRUSHINGS  08/25/2020   Procedure: BRONCHIAL BRUSHINGS;  Surgeon: Garner Nash, DO;  Location: [place] [facility];  Service: Pulmonary;;   BRONCHIAL NEEDLE ASPIRATION BIOPSY  08/25/2020   Procedure: BRONCHIAL NEEDLE ASPIRATION BIOPSIES;  Surgeon: Garner Nash, DO;  Location: [place] [facility];  Service: Pulmonary;;   BRONCHIAL WASHINGS  08/25/2020   Procedure: BRONCHIAL WASHINGS;  Surgeon: Garner Nash, DO;  Location: [place] [facility];  Service: Pulmonary;;   COLONOSCOPY     CORONARY PRESSURE WIRE/FFR WITH 3D MAPPING N/A 06/03/2022   Procedure: Coronary Pressure Wire/FFR w/3D Mapping;  Surgeon: Early Osmond, MD;  Location: [place] [facility];  Service: Cardiovascular;  Laterality: N/A;   CORONARY STENT INTERVENTION N/A 06/03/2022   Procedure: CORONARY STENT INTERVENTION;  Surgeon: Early Osmond, MD;  Location: [place] [facility];  Service: Cardiovascular;  Laterality: N/A;   CYSTOSCOPY/RETROGRADE/URETEROSCOPY/STONE EXTRACTION WITH  BASKET  x2 last one 1990s approx.   CYSTOSCOPY/URETEROSCOPY/HOLMIUM LASER/STENT PLACEMENT Left 04/16/2018   Procedure: CYSTOSCOPY/LEFT URETEROSCOPY/LEFT RETROGRADE/STENT PLACEMENT;  Surgeon: Lucas Mallow, MD;  [facility]: [facility];  Service: Urology;  Laterality: Left;   EUS N/A 05/03/2018   Procedure: UPPER ENDOSCOPIC ULTRASOUND (EUS) RADIAL;  Surgeon: Milus Banister, MD;  [facility];  Service: Gastroenterology;  Laterality: N/A;   EUS N/A 05/03/2018   Procedure: UPPER ENDOSCOPIC ULTRASOUND (EUS) LINEAR;  Surgeon:  Milus Banister, MD;  [facility];  Service: Gastroenterology;  Laterality: N/A;   EUS  10/05/2020   upper GI   EXTRACORPOREAL SHOCK WAVE LITHOTRIPSY  x3  last one 2004 approx.   FINE NEEDLE ASPIRATION N/A 05/03/2018   Procedure: FINE NEEDLE ASPIRATION (FNA) LINEAR;  Surgeon: Milus Banister, MD;  [facility];  Service: Gastroenterology;  Laterality: N/A;   INTERCOSTAL NERVE BLOCK Left 10/16/2020   Procedure: INTERCOSTAL NERVE BLOCK;  Surgeon: Melrose Nakayama, MD;  Location: [place];  Service: Thoracic;  Laterality: Left;   LAPAROSCOPIC NEPHRECTOMY, HAND ASSISTED Right 05/16/2018   Procedure: HAND ASSISTED LAPAROSCOPIC RIGHT NEPHRECTOMY;  Surgeon: Lucas Mallow, MD;  [facility];  Service: Urology;  Laterality: Right;   left index finger attachment  1980s   3-4 surgeries   NODE DISSECTION Left 10/16/2020   Procedure: NODE DISSECTION;  Surgeon: Melrose Nakayama, MD;  Location: [place];  Service: Thoracic;  Laterality: Left;   RIGHT/LEFT HEART CATH AND CORONARY ANGIOGRAPHY N/A 06/03/2022   Procedure: RIGHT/LEFT HEART CATH AND CORONARY ANGIOGRAPHY;  Surgeon: Early Osmond, MD;  Location: [place] [facility];  Service: Cardiovascular;  Laterality: N/A;   TOE SURGERY  1980s   in beween 2nd and 3rd toes cyst removed   UPPER GI ENDOSCOPY     VIDEO BRONCHOSCOPY WITH ENDOBRONCHIAL NAVIGATION Bilateral 08/25/2020   Procedure: VIDEO BRONCHOSCOPY WITH ENDOBRONCHIAL NAVIGATION;  Surgeon: Garner Nash, DO;  Location: [place];  Service: Pulmonary;  Laterality: Bilateral;    Allergies  Allergies  Allergen Reactions   Penicillins Rash and Other (See Comments)    Has patient had a PCN reaction causing immediate rash, facial/tongue/throat swelling, SOB or lightheadedness with hypotension: yes Has patient had a PCN reaction causing severe rash involving mucus membranes or skin necrosis: no Has patient had a PCN reaction that required hospitalization: no Has  patient had a PCN reaction occurring within the last 10 years: no If all of the above answers are "NO", then may proceed with Cephalosporin use.     History of Present Illness    George Lewis. has a PMH of atrial fibrillation on Coumadin, HLD, end-stage renal disease on HD Tuesday Thursday Saturday, type 2 diabetes, renal cell carcinoma, PNA, acute respiratory failure with hypoxemia, pleural effusion, and chest pain.  Echocardiogram 06/06/2022 showed an LVEF of 60-65%, normal RV function, no evidence of intrapulmonary shunting.  He underwent cardiac catheterization on 06/03/2022.  He was noted to have proximal-mid 60% LAD stenosis, second diagonal 75% stenosis, ostial LAD 30%, first diagonal 70%, he received PCI with DES to his mid LAD.  He underwent cardiac catheterization on 10 6.  He noted ongoing numbness and shortness of breath.  He was also noted to have significantly elevated heart rate.  He was noted to have disease in his proximal-mid LAD and received PCI with DES x1.  He was placed on aspirin, Plavix, and Coumadin x1 month.  Plan to stop aspirin at 1 month  time.  He was also started on atorvastatin  and metoprolol succinate 25 mg.  He was ruled out for community-acquired pneumonia.  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  He follows with Dr. Alen Blew for his [place].   He presents to clinic today for follow-up evaluation and states he continues to have some shortness of breath.  He reports that he does feel somewhat better after his catheterization.  He has stopped taking his metoprolol.  His EKG today shows sinus tachycardia with a rate of 112 bpm.  He indicates that he did not tolerate metoprolol due to his blood pressure being too low.  In the clinic today his blood pressure is 110/60.  We reviewed the importance of maintaining normal heart rate.  He and his wife expressed understanding.  I also reviewed that his breathing would improve with a better controlled  heart rate.  He expressed understanding.  I will start carvedilol 6.25 mg twice daily.  He requests that he be seen for Coumadin check today.  I have asked him to increase his physical activity as tolerated, continue to be compliant with dialysis, and we will plan follow-up in 1 month.  Today he denies chest pain, increased shortness of breath, lower extremity edema, fatigue, palpitations, melena, hematuria, hemoptysis, diaphoresis, weakness, presyncope, syncope, orthopnea, and PND.   Home Medications    Prior to Admission medications   Medication Sig Start Date End Date Taking? Authorizing Provider  allopurinol (ZYLOPRIM) 100 MG tablet Take 1 tablet (100 mg total) by mouth every evening. 04/08/21   Lorrene Reid, PA-C  aspirin EC 81 MG tablet Take 1 tablet (81 mg total) by mouth daily. Swallow whole. 06/06/22 07/06/22  Lavina Hamman, MD  atorvastatin (LIPITOR) 40 MG tablet Take 1 tablet (40 mg total) by mouth every evening. 10/19/20   Gold, Wilder Glade, PA-C  axitinib (INLYTA) 5 MG tablet Take 1 tablet (5 mg total) by mouth 2 (two) times daily. HOLD UNTIL SEEN BY ONCOLOGY 06/06/22   Lavina Hamman, MD  blood glucose meter kit and supplies Dispense based on patient and insurance preference. Use up to four times daily as directed. (FOR ICD-10 E10.9, E11.9). 12/22/18   Edwin Dada, MD  calcitRIOL (ROCALTROL) 0.5 MCG capsule Take 1 capsule (0.5 mcg total) by mouth Every Tuesday,Thursday,and Saturday with dialysis. 06/07/22   Lavina Hamman, MD  clopidogrel (PLAVIX) 75 MG tablet Take 1 tablet (75 mg total) by mouth daily with breakfast. 06/07/22   Lavina Hamman, MD  diphenhydramine-acetaminophen (TYLENOL PM) 25-500 MG TABS tablet Take 3 tablets by mouth at bedtime as needed (sleep/headache).    [provider]  ferric citrate (AURYXIA) 1 GM 210 MG(Fe) tablet Take 1 tablet (210 mg total) by mouth daily with supper. Patient taking differently: Take 210 mg by mouth 3 (three) times daily  with meals. 10/19/20   Gold, Wayne E, PA-C  fluticasone (FLONASE) 50 MCG/ACT nasal spray Place 1 spray into both nostrils daily as needed for allergies or rhinitis.    [provider]  gabapentin (NEURONTIN) 100 MG capsule Take 1 capsule (100 mg total) by mouth 2 (two) times daily. Take 1 PO at night for 5 days then go to twice daily Patient not taking: Reported on 05/31/2022 11/04/20   Melrose Nakayama, MD  glucose blood (ACCU-CHEK GUIDE) test strip USE 1 STRIP TO CHECK FASTING BLOOD SUGAR AND 2 HOURS AFTER LARGEST MEAL 08/02/21   Lorrene Reid, PA-C  insulin glargine (  LANTUS) 100 UNIT/ML injection Inject into the skin daily as needed (low blood glucose 180). Unknow dose to give if needed Patient not taking: Reported on 05/13/2022    [provider]  lidocaine-prilocaine (EMLA) cream Apply topically. Patient not taking: Reported on 05/31/2022 01/11/21   [provider]  loratadine (CLARITIN) 10 MG tablet Take 10 mg by mouth every evening.     [provider]  Menthol, Topical Analgesic, (BIOFREEZE EX) Apply 1 Application topically as needed (pain).    [provider]  metoprolol succinate (TOPROL-XL) 25 MG 24 hr tablet Take 1 tablet (25 mg total) by mouth daily. 06/07/22   Lavina Hamman, MD  midodrine (PROAMATINE) 10 MG tablet Take 1 tablet (10 mg total) by mouth 3 (three) times daily as needed (for hypotension/dialysis). AS ordered. Take In the evening 10 mg on Mon. Wed and Friday Take 20 mg on Dialysis days Tues, Thurs and Saturday. 10 mg when he gets there and 10 mg half way through. Patient not taking: Reported on 05/31/2022 12/03/21   Elouise Munroe, MD  nitroGLYCERIN (NITROSTAT) 0.4 MG SL tablet Place 1 tablet (0.4 mg total) under the tongue every 5 (five) minutes as needed for chest pain. 03/07/19   Swayze, Ava, DO  omeprazole (PRILOSEC OTC) 20 MG tablet Take 40 mg by mouth daily before breakfast.     [provider]  pembrolizumab  (KEYTRUDA) 100 MG/4ML SOLN Inject into the vein every 6 (six) weeks. Infused at providers office 01/07/21   [provider]  prochlorperazine (COMPAZINE) 10 MG tablet Take 1 tablet (10 mg total) by mouth every 6 (six) hours as needed for nausea or vomiting. Patient taking differently: Take 10 mg by mouth as needed for nausea or vomiting. 03/09/22   Wyatt Portela, MD  Sennosides (SENOKOT EXTRA STRENGTH) 17.2 MG TABS Take 51.6 mg by mouth at bedtime as needed (constipation).    [provider]  traMADol (ULTRAM) 50 MG tablet TAKE 1 TABLET BY MOUTH EVERY 6 HOURS AS NEEDED FOR PAIN Patient taking differently: Take 50 mg by mouth as needed for moderate pain. 02/10/22   Wyatt Portela, MD  warfarin (COUMADIN) 3 MG tablet TAKE 1 TO 2 TABLETS BY MOUTH DAILY AS DIRECTED BY  THE  COUMADIN  CLINIC Patient taking differently: Take 1.5-3 mg by mouth See admin instructions. Take 3 mg by mouth in the evening on Sun/Tues/Wed/Thurs/Sat and 1.5 mg on Mon/Fri 08/12/21   Elouise Munroe, MD    Family History    Family History  Problem Relation Age of Onset   Alzheimer's disease Mother    Alzheimer's disease Father    Heart disease Father    Heart attack Father    Cancer - Other Sister    He indicated that his mother is deceased. He indicated that his father is deceased. He indicated that the status of his sister is unknown.  Social History    Social History   Socioeconomic History   Marital status: Married    Spouse name: Not on file   Number of children: Not on file   Years of education: Not on file   Highest education level: Not on file  Occupational History   Occupation: disabled  Tobacco Use   Smoking status: Never   Smokeless tobacco: Never  Vaping Use   Vaping Use: Never used  Substance and Sexual Activity   Alcohol use: Never   Drug use: Never   Sexual activity: Not on file  Other Topics Concern   Not on file  Social History Narrative   Not on file   Social  Determinants of Health   Financial Resource Strain: Unknown (04/19/2018)   Overall Financial Resource Strain (CARDIA)    Difficulty of Paying Living Expenses: Patient refused  Food Insecurity: No Food Insecurity (06/05/2022)   Hunger Vital Sign    Worried About Running Out of Food in the Last Year: Never true    Ran Out of Food in the Last Year: Never true  Transportation Needs: No Transportation Needs (06/05/2022)   PRAPARE - Hydrologist (Medical): No    Lack of Transportation (Non-Medical): No  Physical Activity: Inactive (04/19/2018)   Exercise Vital Sign    Days of Exercise per Week: 0 days    Minutes of Exercise per Session: 0 min  Stress: Not on file  Social Connections: Moderately Integrated (04/19/2018)   Social Connection and Isolation Panel [NHANES]    Frequency of Communication with Friends and Family: More than three times a week    Frequency of Social Gatherings with Friends and Family: Never    Attends Religious Services: More than 4 times per year    Active Member of [employer] or Organizations: No    Attends Archivist Meetings: Never    Marital Status: Married  Human resources officer Violence: Not At Risk (06/05/2022)   Humiliation, Afraid, Rape, and Kick questionnaire    Fear of Current or Ex-Partner: No    Emotionally Abused: No    Physically Abused: No    Sexually Abused: No     Review of Systems    General:  No chills, fever, night sweats or weight changes.  Cardiovascular:  No chest pain, dyspnea on exertion, edema, orthopnea, palpitations, paroxysmal nocturnal dyspnea. Dermatological: No rash, lesions/masses Respiratory: No cough, dyspnea Urologic: No hematuria, dysuria Abdominal:   No nausea, vomiting, diarrhea, bright red blood per rectum, melena, or hematemesis Neurologic:  No visual changes, wkns, changes in mental status. All other systems reviewed and are otherwise negative except as noted above.  Physical Exam    VS:   BP 110/60 (BP Location: Left Arm, Patient Position: Sitting, Cuff Size: Large)   Pulse (!) 113   Ht $R'6\' 1"'vU$  (1.854 m)   Wt 284 lb 6.4 oz (129 kg)   SpO2 99%   BMI 37.52 kg/m  , BMI Body mass index is 37.52 kg/m. GEN: Well nourished, well developed, in no acute distress. HEENT: normal. Neck: Supple, no JVD, carotid bruits, or masses. Cardiac: RRR, no murmurs, rubs, or gallops. No clubbing, cyanosis, edema.  Radials/DP/PT 2+ and equal bilaterally.  Respiratory:  Respirations regular and unlabored, clear to auscultation bilaterally. GI: Soft, nontender, nondistended, BS + x 4. MS: no deformity or atrophy. Skin: warm and dry, no rash. Neuro:  Strength and sensation are intact. Psych: Normal affect.  Accessory Clinical Findings    Recent Labs: 04/22/2022: TSH 1.989 05/31/2022: B Natriuretic Peptide 462.7 06/01/2022: ALT 11 06/04/2022: Magnesium 2.3 06/06/2022: BUN 67; Creatinine, Ser 11.86; Hemoglobin 11.7; Platelets 281; Potassium 3.4; Sodium 134   Recent Lipid Panel    Component Value Date/Time   CHOL 229 (H) 04/02/2021 0908   TRIG 347 (H) 04/02/2021 0908   HDL 31 (L) 04/02/2021 0908   CHOLHDL 7.4 (H) 04/02/2021 0908   LDLCALC 135 (H) 04/02/2021 0908         ECG personally reviewed by me today-sinus tachycardia 112 bpm - No acute changes  Echocardiogram 06/06/2022  IMPRESSIONS     1. Left ventricular ejection fraction, by estimation, is 60 to 65%. The  left ventricle has normal function.   2. Right ventricular systolic function is normal. The right ventricular  size is normal.   3. Agitated saline contrast bubble study was negative, with no evidence  of any early (intracardiac) or late (peripheral) intrapulmonary shunting  seen.   4. Limited study for bubble study only.   FINDINGS   Left Ventricle: Left ventricular ejection fraction, by estimation, is 60  to 65%. The left ventricle has normal function.   Right Ventricle: The right ventricular size is normal. Right  ventricular  systolic function is normal.   IAS/Shunts: No atrial level shunt detected by color flow Doppler. Agitated  saline contrast was given intravenously to evaluate for intracardiac  shunting. Agitated saline contrast bubble study was negative, with no  evidence of any interatrial shunt.   Glori Bickers MD  Electronically signed by Glori Bickers MD  Signature Date/Time: 06/06/2022/10:29:51 AM   Cardiac cath 06/03/22    Prox LAD to Mid LAD lesion is 60% stenosed.   2nd Diag lesion is 75% stenosed.   Ost LAD lesion is 30% stenosed.   1st Diag lesion is 70% stenosed.   A stent was successfully placed.   Post intervention, there is a 0% residual stenosis.   1.  RFR positive proximal to mid LAD lesion treated with 1 drug-eluting stent with mild to moderate disease elsewhere (that should be treated medically). 2.  Cardiac output of 20.5 L/min and cardiac index of 8.29 L/min/m due to presence of AV fistula. 3.  Low normal filling pressures with mean RA of 4 mmHg, mean wedge pressure of 5 mmHg, and mean PA pressure of 19 mmHg with LVEDP of 10 mmHg.   Recommendation: Dual antiplatelet therapy for preferably 1 year.  Plavix was administered rather than ticagrelor given the lack of an acute coronary syndrome and given the patient's ongoing dyspnea of uncertain etiology.   Assessment & Plan   1.  Coronary artery disease-no chest pain today.  Underwent cardiac catheterization on 06/03/2022.  He was noted to have proximal-mid LAD stenosis.  He received PCI with DES x1.  Details above.  Plans for triple therapy for 1 month with aspirin, Coumadin, and Plavix. Stop aspirin on 07/04/2022 Continue Plavix, atorvastatin,  Start carvedilol 6.25 mg twice daily Heart healthy low-sodium diet Increase physical activity as tolerated  Hyperlipidemia-LDL 135 on 04/02/21 Continue aspirin, atorvastatin Heart healthy low-sodium diet-salty 6 given Increase physical activity as tolerated Repeat  fasting lipids and lfts   Atrial fibrillation-heart rate today 112 bpm.  Reports compliance with Coumadin and denies bleeding issues.  Has not been taking metoprolol. Continue  Coumadin Start carvedilol 6.25 mg twice daily Avoid triggers caffeine, chocolate, EtOH, dehydration etc. Increase physical activity as tolerated Repeat fasting lipids and LFTs in 4-6 weeks.  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 Follows with oncology  RCC-follows with oncology.  Tolerating medication well.  Disposition: Follow-up with Dr. Margaretann Loveless or me in 3-4 months.   Jossie Ng. Nakyiah Kuck NP-C     06/13/2022, 4:29 PM [address] Office [phone] Fax [phone]  Notice: This dictation was prepared with Dragon dictation along with smaller phrase technology. Any transcriptional errors that result from this process are unintentional and may not be corrected upon review.  I spent 14 minutes examining this patient, reviewing medications, and using patient  centered shared decision making involving her cardiac care.  Prior to her visit I spent greater than 20 minutes reviewing her past medical history,  medications, and prior cardiac tests.

## 2022-06-13 ENCOUNTER — Encounter: Payer: Self-pay | Admitting: General Practice

## 2022-06-13 ENCOUNTER — Ambulatory Visit: Payer: Medicare Other | Attending: General Practice | Admitting: General Practice

## 2022-06-13 ENCOUNTER — Ambulatory Visit (INDEPENDENT_AMBULATORY_CARE_PROVIDER_SITE_OTHER): Payer: Medicare Other

## 2022-06-13 VITALS — BP 110/60 | HR 113 | Ht 73.0 in | Wt 284.4 lb

## 2022-06-13 DIAGNOSIS — I2510 Atherosclerotic heart disease of native coronary artery without angina pectoris: Secondary | ICD-10-CM | POA: Insufficient documentation

## 2022-06-13 DIAGNOSIS — Z5181 Encounter for therapeutic drug level monitoring: Secondary | ICD-10-CM | POA: Diagnosis present

## 2022-06-13 DIAGNOSIS — C641 Malignant neoplasm of right kidney, except renal pelvis: Secondary | ICD-10-CM | POA: Diagnosis present

## 2022-06-13 DIAGNOSIS — E7800 Pure hypercholesterolemia, unspecified: Secondary | ICD-10-CM | POA: Insufficient documentation

## 2022-06-13 DIAGNOSIS — I480 Paroxysmal atrial fibrillation: Secondary | ICD-10-CM | POA: Diagnosis present

## 2022-06-13 DIAGNOSIS — R0609 Other forms of dyspnea: Secondary | ICD-10-CM | POA: Insufficient documentation

## 2022-06-13 LAB — POCT INR: INR: 2.2 (ref 2.0–3.0)

## 2022-06-13 MED ORDER — CARVEDILOL 6.25 MG PO TABS: 6.2500 mg | ORAL_TABLET | Freq: Two times a day (BID) | ORAL | 3 refills | Status: DC

## 2022-06-13 NOTE — Patient Instructions (Signed)
Medication Instructions:  STOP METOPROLOL  START CARVEDILOL 3.25 MG TWICE DAILY *If you need a refill on your cardiac medications before your next appointment, please call your pharmacy*  Lab Work: FASTING LIPID AND LFT IN 4-6 WEEKS If you have labs (blood work) drawn today and your tests are completely normal, you will receive your results only by:  [place] (if you have MyChart) OR  A paper copy in the mail  If you have any lab test that is abnormal or we need to change your treatment, we will call you to review the results.  Testing/Procedures: NONE  Other Instructions INCREASE PHYSICAL ACTIVITY AS TOLERATED  Follow-Up: At [facility], you and your health needs are our priority.  As part of our continuing mission to provide you with exceptional heart care, we have created designated Provider Care Teams.  These Care Teams include your primary Cardiologist (physician) and Advanced Practice Providers (APPs -  Physician Assistants and Nurse Practitioners) who all work together to provide you with the care you need, when you need it.  We recommend signing up for the patient portal called "MyChart".  Sign up information is provided on this After Visit Summary.  MyChart is used to connect with patients for Virtual Visits (Telemedicine).  Patients are able to view lab/test results, encounter notes, upcoming appointments, etc.  Non-urgent messages can be sent to your provider as well.   To learn more about what you can do with MyChart, go to [email or web address].    Your next appointment:   1 month(s)  The format for your next appointment:   In Person  Provider:   Elouise Munroe, MD  OR Coletta Memos, FNP-C  Important Information About Sugar

## 2022-06-13 NOTE — Patient Instructions (Signed)
Continue taking 1 tablet daily except 0.5 tablet on Mondays and Fridays.  Repeat INR in 6 weeks.  [facility] Clinic [phone]

## 2022-06-14 ENCOUNTER — Ambulatory Visit: Payer: Medicare Other

## 2022-06-28 ENCOUNTER — Telehealth: Payer: Self-pay | Admitting: *Deleted

## 2022-06-28 ENCOUNTER — Telehealth: Payer: Self-pay | Admitting: Pharmacy Technician

## 2022-06-28 NOTE — Telephone Encounter (Signed)
Patient's wife called. Due to patient's recent hospitalization for cardiac cath/procedure, he missed dose of Keytruda. Next dose is scheduled on 11/17 following labs and appt with Dr.Shadad. Does this appt need to be moved to an earlier date? He stopped taking Inlyta on 10/3. Ms. Delatte wants to know if he should restart Bartholomew Boards now or wait until after he sees Dr. Alen Blew on 11/17.   Advised her that both questions will be sent to Dr. Alen Blew. She verbalized understanding.

## 2022-06-28 NOTE — Telephone Encounter (Addendum)
Oral Oncology Patient Advocate Encounter   Received notification that patient is due for re-enrollment for assistance for Inlyta through [employer].   Re-enrollment process has been initiated and will be submitted upon completion of necessary documents.  [employer] [facility] phone number [phone].   I will continue to follow until final determination.  [name], CPhT-Adv Oncology Pharmacy Patient [place] Direct Number: [phone]  Fax: [phone]

## 2022-06-29 NOTE — Telephone Encounter (Signed)
Dr. Alen Blew response: He needs to hold treatment till 11/17 Contacted Ms. Hurn with Dr. Hazeline Junker response. LVM on named voice with information as per Dr. Alen Blew and encouraged to contact office for further questions or concerns.

## 2022-06-30 NOTE — Telephone Encounter (Signed)
Oral Oncology Patient Advocate Encounter   Submitted MD signatures for PAP enrollment.  Status is pending  I will continue to follow up until a determination is made.  [name], CPhT-Adv Oncology Pharmacy Patient [place] Direct Number: [phone]  Fax: [phone]

## 2022-07-05 ENCOUNTER — Encounter (HOSPITAL_COMMUNITY): Payer: Medicare Other | Admitting: Internal Medicine

## 2022-07-11 ENCOUNTER — Encounter (HOSPITAL_COMMUNITY): Payer: Self-pay | Admitting: Internal Medicine

## 2022-07-11 ENCOUNTER — Ambulatory Visit (HOSPITAL_COMMUNITY)
Admission: RE | Admit: 2022-07-11 | Discharge: 2022-07-11 | Disposition: A | Discharge status: Home / Self Care | Payer: Medicare Other | Source: Ambulatory Visit | Attending: Internal Medicine | Admitting: Internal Medicine

## 2022-07-11 VITALS — BP 100/70 | HR 119 | Wt 278.8 lb

## 2022-07-11 DIAGNOSIS — I509 Heart failure, unspecified: Secondary | ICD-10-CM | POA: Insufficient documentation

## 2022-07-11 DIAGNOSIS — C7800 Secondary malignant neoplasm of unspecified lung: Secondary | ICD-10-CM | POA: Diagnosis not present

## 2022-07-11 DIAGNOSIS — Z7901 Long term (current) use of anticoagulants: Secondary | ICD-10-CM | POA: Diagnosis not present

## 2022-07-11 DIAGNOSIS — K760 Fatty (change of) liver, not elsewhere classified: Secondary | ICD-10-CM | POA: Insufficient documentation

## 2022-07-11 DIAGNOSIS — I132 Hypertensive heart and chronic kidney disease with heart failure and with stage 5 chronic kidney disease, or end stage renal disease: Secondary | ICD-10-CM | POA: Diagnosis not present

## 2022-07-11 DIAGNOSIS — I43 Cardiomyopathy in diseases classified elsewhere: Secondary | ICD-10-CM | POA: Diagnosis not present

## 2022-07-11 DIAGNOSIS — I5043 Acute on chronic combined systolic (congestive) and diastolic (congestive) heart failure: Secondary | ICD-10-CM

## 2022-07-11 DIAGNOSIS — Z86711 Personal history of pulmonary embolism: Secondary | ICD-10-CM | POA: Diagnosis not present

## 2022-07-11 DIAGNOSIS — Z87442 Personal history of urinary calculi: Secondary | ICD-10-CM | POA: Diagnosis not present

## 2022-07-11 DIAGNOSIS — Z79899 Other long term (current) drug therapy: Secondary | ICD-10-CM | POA: Diagnosis not present

## 2022-07-11 DIAGNOSIS — G4733 Obstructive sleep apnea (adult) (pediatric): Secondary | ICD-10-CM | POA: Diagnosis not present

## 2022-07-11 DIAGNOSIS — I350 Nonrheumatic aortic (valve) stenosis: Secondary | ICD-10-CM | POA: Insufficient documentation

## 2022-07-11 DIAGNOSIS — I5083 High output heart failure: Secondary | ICD-10-CM

## 2022-07-11 DIAGNOSIS — K219 Gastro-esophageal reflux disease without esophagitis: Secondary | ICD-10-CM | POA: Diagnosis not present

## 2022-07-11 DIAGNOSIS — Z992 Dependence on renal dialysis: Secondary | ICD-10-CM | POA: Insufficient documentation

## 2022-07-11 DIAGNOSIS — I480 Paroxysmal atrial fibrillation: Secondary | ICD-10-CM | POA: Diagnosis not present

## 2022-07-11 DIAGNOSIS — Z905 Acquired absence of kidney: Secondary | ICD-10-CM | POA: Insufficient documentation

## 2022-07-11 DIAGNOSIS — E1122 Type 2 diabetes mellitus with diabetic chronic kidney disease: Secondary | ICD-10-CM | POA: Diagnosis not present

## 2022-07-11 DIAGNOSIS — R935 Abnormal findings on diagnostic imaging of other abdominal regions, including retroperitoneum: Secondary | ICD-10-CM

## 2022-07-11 DIAGNOSIS — Z955 Presence of coronary angioplasty implant and graft: Secondary | ICD-10-CM | POA: Diagnosis not present

## 2022-07-11 DIAGNOSIS — K589 Irritable bowel syndrome without diarrhea: Secondary | ICD-10-CM | POA: Insufficient documentation

## 2022-07-11 DIAGNOSIS — E1151 Type 2 diabetes mellitus with diabetic peripheral angiopathy without gangrene: Secondary | ICD-10-CM | POA: Insufficient documentation

## 2022-07-11 DIAGNOSIS — R0602 Shortness of breath: Secondary | ICD-10-CM | POA: Diagnosis not present

## 2022-07-11 DIAGNOSIS — N186 End stage renal disease: Secondary | ICD-10-CM | POA: Insufficient documentation

## 2022-07-11 DIAGNOSIS — Z85528 Personal history of other malignant neoplasm of kidney: Secondary | ICD-10-CM | POA: Insufficient documentation

## 2022-07-11 DIAGNOSIS — Z7902 Long term (current) use of antithrombotics/antiplatelets: Secondary | ICD-10-CM | POA: Diagnosis not present

## 2022-07-11 DIAGNOSIS — I2510 Atherosclerotic heart disease of native coronary artery without angina pectoris: Secondary | ICD-10-CM | POA: Diagnosis not present

## 2022-07-11 LAB — COMPREHENSIVE METABOLIC PANEL
ALT: 13 U/L (ref 0–44)
AST: 18 U/L (ref 15–41)
Albumin: 3.8 g/dL (ref 3.5–5.0)
Alkaline Phosphatase: 55 U/L (ref 38–126)
Anion gap: 24 — ABNORMAL HIGH (ref 5–15)
BUN: 53 mg/dL — ABNORMAL HIGH (ref 8–23)
CO2: 21 mmol/L — ABNORMAL LOW (ref 22–32)
Calcium: 9.3 mg/dL (ref 8.9–10.3)
Chloride: 91 mmol/L — ABNORMAL LOW (ref 98–111)
Creatinine, Ser: 11.55 mg/dL — ABNORMAL HIGH (ref 0.61–1.24)
GFR, Estimated: 4 mL/min — ABNORMAL LOW (ref >60)
Glucose, Bld: 103 mg/dL — ABNORMAL HIGH (ref 70–99)
Potassium: 4.1 mmol/L (ref 3.5–5.1)
Sodium: 136 mmol/L (ref 135–145)
Total Bilirubin: 0.4 mg/dL (ref 0.3–1.2)
Total Protein: 8.4 g/dL — ABNORMAL HIGH (ref 6.5–8.1)

## 2022-07-11 LAB — CBC
HCT: 36.3 % — ABNORMAL LOW (ref 39.0–52.0)
Hemoglobin: 12.3 g/dL — ABNORMAL LOW (ref 13.0–17.0)
MCH: 31.9 pg (ref 26.0–34.0)
MCHC: 33.9 g/dL (ref 30.0–36.0)
MCV: 94 fL (ref 80.0–100.0)
Platelets: 359 10*3/uL (ref 150–400)
RBC: 3.86 MIL/uL — ABNORMAL LOW (ref 4.22–5.81)
RDW: 14.6 % (ref 11.5–15.5)
WBC: 14.8 10*3/uL — ABNORMAL HIGH (ref 4.0–10.5)
nRBC: 0 % (ref 0.0–0.2)

## 2022-07-11 LAB — BRAIN NATRIURETIC PEPTIDE: B Natriuretic Peptide: 49.2 pg/mL (ref 0.0–100.0)

## 2022-07-11 LAB — SEDIMENTATION RATE: Sed Rate: 76 mm/hr — ABNORMAL HIGH (ref 0–16)

## 2022-07-11 LAB — TSH: TSH: 1.742 u[IU]/mL (ref 0.350–4.500)

## 2022-07-11 LAB — T4, FREE: Free T4: 0.68 ng/dL (ref 0.61–1.12)

## 2022-07-11 NOTE — Patient Instructions (Signed)
STOP Carvedilol  Labs done today, your results will be available in MyChart, we will contact you for abnormal readings.  Your provider has order a CT Scan. ONCE APPROVED BY YOUR INSURANCE COMPANY YOU WILL BE CALLED TO HAVE THE TEST ARRANGED.  Your physician recommends that you schedule a follow-up appointment in: as scheduled.  If you have any questions or concerns before your next appointment please send [place] a message through [place] or call our office at [phone].    TO LEAVE A MESSAGE FOR THE NURSE SELECT OPTION 2, PLEASE LEAVE A MESSAGE INCLUDING: YOUR NAME DATE OF BIRTH CALL BACK NUMBER REASON FOR CALL**this is important as we prioritize the call backs  YOU WILL RECEIVE A CALL BACK THE SAME DAY AS LONG AS YOU CALL BEFORE 4:00 PM  At the [facility], you and your health needs are our priority. As part of our continuing mission to provide you with exceptional heart care, we have created designated Provider Care Teams. These Care Teams include your primary Cardiologist (physician) and Advanced Practice Providers (APPs- Physician Assistants and Nurse Practitioners) who all work together to provide you with the care you need, when you need it.   You may see any of the following providers on your designated Care Team at your next follow up: Dr Glori Bickers Dr Loralie Champagne Dr. Roxana Hires, NP Lyda Jester, [place] [facility] [place], [place] Forestine Na, NP Audry Riles, PharmD   Please be sure to bring in all your medications bottles to every appointment.

## 2022-07-11 NOTE — Progress Notes (Signed)
ADVANCED HF CLINIC NOTE  PCP: Lorrene Reid, PA-C Primary Cardiologist: Elouise Munroe, MD Nephrology:  Dr. Johnney Ou Oncologist: Dr. Alen Blew  HPI:  George Lewis. 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    Started HD 3 years ago. Initially had AVF in left wrist but this failed immediately. Had HD through  perm cath for a while then established LUE AVF which has worked well since it was placed > 2 years ago.    Reports 2 month h/o mild DOE. Says it got really bad after starting Enlyta about a week ago for his lung CA.    Came to ER with CP and SOB. Hstrop and ECG un remarkable. Echo EF 65% with mild to moderate AS. RV normal size and function. CXR basilar atx    Underwent R/L cath had moderate CAD visually but mid LAD 60 % was hemodynamically significant by FFR so had PCI/stenting   RHC with RA 4 PA mean 19 PCWP 5 PA sat x 1 = 88% (no recheck). CO 20.6 L/min. Thermodilution and shunt run not performed.   During hospitalization concern for amio toxicity. ESR 79. CT without PE or clear evidence of toxicity. Amio stopped. Received prednisone   Saw [facility] started carvedilol for tachycardia. Now having to increase midodrine to support BP.   Here with his wife. Says he is breathing better since being discharged but still not back to baseline. Says he has to sleep in recliner to catch his breath. Dry weight dropped 15 pounds and still not improved. No edema. TSH was normal in 8/23    ROS: All systems negative except as listed in HPI, PMH and Problem List.  SH:  Social History   Socioeconomic History   Marital status: Married    Spouse name: Not on file   Number of children: Not on file   Years of education: Not on file   Highest education level: Not on file  Occupational History   Occupation: disabled  Tobacco Use   Smoking status:  Never   Smokeless tobacco: Never  Vaping Use   Vaping Use: Never used  Substance and Sexual Activity   Alcohol use: Never   Drug use: Never   Sexual activity: Not on file  Other Topics Concern   Not on file  Social History Narrative   Not on file   Social Determinants of Health   Financial Resource Strain: Unknown (04/19/2018)   Overall Financial Resource Strain (CARDIA)    Difficulty of Paying Living Expenses: Patient refused  Food Insecurity: No Food Insecurity (06/05/2022)   Hunger Vital Sign    Worried About Running Out of Food in the Last Year: Never true    Ran Out of Food in the Last Year: Never true  Transportation Needs: No Transportation Needs (06/05/2022)   PRAPARE - Hydrologist (Medical): No    Lack of Transportation (Non-Medical): No  Physical Activity: Inactive (04/19/2018)   Exercise Vital Sign    Days of Exercise per Week: 0 days    Minutes of Exercise per Session: 0 min  Stress: Not on file  Social Connections: Moderately Integrated (04/19/2018)   Social Connection and Isolation Panel [NHANES]    Frequency of Communication with Friends and Family: More than three times a week    Frequency of  Social Gatherings with Friends and Family: Never    Attends Religious Services: More than 4 times per year    Active Member of Clubs or Organizations: No    Attends Archivist Meetings: Never    Marital Status: Married  Human resources officer Violence: Not At Risk (06/05/2022)   Humiliation, Afraid, Rape, and Kick questionnaire    Fear of Current or Ex-Partner: No    Emotionally Abused: No    Physically Abused: No    Sexually Abused: No    FH:  Family History  Problem Relation Age of Onset   Alzheimer's disease Mother    Alzheimer's disease Father    Heart disease Father    Heart attack Father    Cancer - Other Sister     Past Medical History:  Diagnosis Date   Anemia    Aortic stenosis 05/2022   Arthritis    knees   Atrial  fibrillation ([place])    Bilateral renal cysts 03/23/2018   Noted MRI ABD   CAD (coronary artery disease), native coronary artery 06/03/2022   s/p PCI mLAD   Cancer [facility])    right kidney cancer   Cancer ([place])    pancreatic   Cholelithiasis 03/19/2018   noted on CT AB/Pelvis   Diabetes mellitus without complication ([place])    Diverticulosis of colon 04/19/2018   Noted on CT abd/pelvis   Dysrhythmia    afib   ESRD on hemodialysis [facility])    nephrologist-- dr Hollie Salk [name]);  05-01-2018 has not started dialysis, scheduled for AV fistula creation 05-07-2018   GERD (gastroesophageal reflux disease)    Hepatic steatosis 03/19/2018   Mild diffuse, noted on CT AB/Pelvis   History of kidney stones    History of pulmonary embolus (PE) 03/2008   treated with coumadin for 6 months   History of sepsis 04/20/2018   per discharge note , probable UTI   Hypertension    IBS (irritable bowel syndrome)    Nocturia    OSA on CPAP    uses CPAP nightly   Pancreatic lesion    0.4cm cystic per CT 07/ 2019   Peripheral vascular disease ([place])    Pneumonia    x 1   Pulmonary nodule    Solid 33m Right Lower Lobe   Right renal mass 04/10/2018   new dx--  scheduled for nephrectomy 05-16-2018   S/P ureteral stent placement: 04/16/2018 04/19/2018    Current Outpatient Medications  Medication Sig Dispense Refill   allopurinol (ZYLOPRIM) 100 MG tablet Take 1 tablet (100 mg total) by mouth every evening. 30 tablet 0   atorvastatin (LIPITOR) 40 MG tablet Take 1 tablet (40 mg total) by mouth every evening.     blood glucose meter kit and supplies Dispense based on patient and insurance preference. Use up to four times daily as directed. (FOR ICD-10 E10.9, E11.9). 1 each 0   calcitRIOL (ROCALTROL) 0.5 MCG capsule Take 1 capsule (0.5 mcg total) by mouth Every Tuesday,Thursday,and Saturday with dialysis. 30 capsule 0   clopidogrel (PLAVIX) 75 MG tablet Take 1 tablet (75 mg total) by mouth daily with  breakfast. 120 tablet 0   diphenhydramine-acetaminophen (TYLENOL PM) 25-500 MG TABS tablet Take 3 tablets by mouth at bedtime as needed (sleep/headache).     ferric citrate (AURYXIA) 1 GM 210 MG(Fe) tablet Take 1 tablet (210 mg total) by mouth daily with supper. (Patient taking differently: Take 210 mg by mouth 3 (three) times daily with meals.) 270 tablet  fluticasone (FLONASE) 50 MCG/ACT nasal spray Place 1 spray into both nostrils daily as needed for allergies or rhinitis.     glucose blood (ACCU-CHEK GUIDE) test strip USE 1 STRIP TO CHECK FASTING BLOOD SUGAR AND 2 HOURS AFTER LARGEST MEAL 100 each 0   insulin glargine (LANTUS) 100 UNIT/ML injection Inject into the skin daily as needed (low blood glucose 180). Unknow dose to give if needed     loratadine (CLARITIN) 10 MG tablet Take 10 mg by mouth every evening.      Menthol, Topical Analgesic, (BIOFREEZE EX) Apply 1 Application topically as needed (pain).     midodrine (PROAMATINE) 10 MG tablet Take 1 tablet (10 mg total) by mouth 3 (three) times daily as needed (for hypotension/dialysis). AS ordered. Take In the evening 10 mg on Mon. Wed and Friday Take 20 mg on Dialysis days Tues, Thurs and Saturday. 10 mg when he gets there and 10 mg half way through. 270 tablet 3   nitroGLYCERIN (NITROSTAT) 0.4 MG SL tablet Place 1 tablet (0.4 mg total) under the tongue every 5 (five) minutes as needed for chest pain. 14 tablet 12   omeprazole (PRILOSEC OTC) 20 MG tablet Take 40 mg by mouth daily before breakfast.      pembrolizumab (KEYTRUDA) 100 MG/4ML SOLN Inject into the vein every 6 (six) weeks. Infused at providers office     prochlorperazine (COMPAZINE) 10 MG tablet Take 1 tablet (10 mg total) by mouth every 6 (six) hours as needed for nausea or vomiting. 60 tablet 1   Sennosides (SENOKOT EXTRA STRENGTH) 17.2 MG TABS Take 51.6 mg by mouth at bedtime as needed (constipation).     traMADol (ULTRAM) 50 MG tablet TAKE 1 TABLET BY MOUTH EVERY 6 HOURS AS  NEEDED FOR PAIN 30 tablet 0   warfarin (COUMADIN) 3 MG tablet TAKE 1 TO 2 TABLETS BY MOUTH DAILY AS DIRECTED BY  THE  COUMADIN  CLINIC (Patient taking differently: Take 1.5-3 mg by mouth See admin instructions. Take 3 mg by mouth in the evening on Sun/Tues/Wed/Thurs/Sat and 1.5 mg on Mon/Fri) 60 tablet 5   carvedilol (COREG) 6.25 MG tablet Take 1 tablet (6.25 mg total) by mouth 2 (two) times daily. (Patient not taking: Reported on 07/11/2022) 60 tablet 3   No current facility-administered medications for this encounter.    Vitals:   07/11/22 1520  BP: 100/70  Pulse: (!) 119  SpO2: 99%  Weight: 126.5 kg (278 lb 12.8 oz)    PHYSICAL EXAM:  General:  Weak appearing. No resp difficulty HEENT: normal Neck: supple. no JVD. Carotids 2+ bilat; no bruits. No lymphadenopathy or thryomegaly appreciated. Cor: PMI nondisplaced. Regular tachy 2/6 AS Lungs: clear. Decreased at bases Abdomen: soft, nontender, nondistended. No hepatosplenomegaly. No bruits or masses. Good bowel sounds. Extremities: no cyanosis, clubbing, rash, edema  LUE fistula Neuro: alert & orientedx3, cranial nerves grossly intact. moves all 4 extremities w/o difficulty. Affect pleasant   ECG: Sinus tach 119 No ST-T wave abnormalities. Personally reviewed   ASSESSMENT & PLAN:  1. High output HF - he has classic signs and symptoms of progressive high-output HF with high output state on RHC - while development of high-output state is well-described due to AVF, his AVF is not overly prominent and symptoms began just after his nephrectomy - will plan CT C/A/P to look for other sites of shunting. If no other sites located will need to repeat RHC with and without ligation of fistula - will also check TSH, thiamine  and other labs.     2. CAD  - LHC 60% mLAD, RFR positive suggesting hemodynamically significant lesion. S/p PCI + DES - residual 70% mid 1st Diag and 75% prox 2nd Diag management medically - DAPT w/ ASA + Plavix -  on ? blocker + high intensity statin  - No s/s angina   3. Aortic Stenosis - mild-mod on echo, mGradient 18.0 mmHg. EF 55-60% - will need annual echocardiograms for surveillance    4. PAF - in sinus today - on amiodarone + warfarin  - w/ elevated ESR and dyspnea, ? Amiodarone toxicity. Now off amio due to possible concerns for amio toxicity.  - Recheck ESR    5. ESRD - on HD T/Th/Sat - per nephrology    6. RCC w/ lung Mets - followed by Dr. Alen Blew    7. ? CAP - per primary team    8. Type 2DM - per primary team   Total time spent 45 minutes. Over half that time spent discussing above.     Glori Bickers, MD  3:54 PM

## 2022-07-11 NOTE — H&P (View-Only) (Signed)
ADVANCED HF CLINIC NOTE  PCP: Lorrene Reid, PA-C Primary Cardiologist: Elouise Munroe, MD Nephrology:  Dr. Johnney Ou Oncologist: Dr. Alen Blew  HPI:  George Lewis. 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    Started HD 3 years ago. Initially had AVF in left wrist but this failed immediately. Had HD through  perm cath for a while then established LUE AVF which has worked well since it was placed > 2 years ago.    Reports 2 month h/o mild DOE. Says it got really bad after starting Enlyta about a week ago for his lung CA.    Came to ER with CP and SOB. Hstrop and ECG un remarkable. Echo EF 65% with mild to moderate AS. RV normal size and function. CXR basilar atx    Underwent R/L cath had moderate CAD visually but mid LAD 60 % was hemodynamically significant by FFR so had PCI/stenting   RHC with RA 4 PA mean 19 PCWP 5 PA sat x 1 = 88% (no recheck). CO 20.6 L/min. Thermodilution and shunt run not performed.   During hospitalization concern for amio toxicity. ESR 79. CT without PE or clear evidence of toxicity. Amio stopped. Received prednisone   Saw [facility] started carvedilol for tachycardia. Now having to increase midodrine to support BP.   Here with his wife. Says he is breathing better since being discharged but still not back to baseline. Says he has to sleep in recliner to catch his breath. Dry weight dropped 15 pounds and still not improved. No edema. TSH was normal in 8/23    ROS: All systems negative except as listed in HPI, PMH and Problem List.  SH:  Social History   Socioeconomic History   Marital status: Married    Spouse name: Not on file   Number of children: Not on file   Years of education: Not on file   Highest education level: Not on file  Occupational History   Occupation: disabled  Tobacco Use   Smoking status:  Never   Smokeless tobacco: Never  Vaping Use   Vaping Use: Never used  Substance and Sexual Activity   Alcohol use: Never   Drug use: Never   Sexual activity: Not on file  Other Topics Concern   Not on file  Social History Narrative   Not on file   Social Determinants of Health   Financial Resource Strain: Unknown (04/19/2018)   Overall Financial Resource Strain (CARDIA)    Difficulty of Paying Living Expenses: Patient refused  Food Insecurity: No Food Insecurity (06/05/2022)   Hunger Vital Sign    Worried About Running Out of Food in the Last Year: Never true    Ran Out of Food in the Last Year: Never true  Transportation Needs: No Transportation Needs (06/05/2022)   PRAPARE - Hydrologist (Medical): No    Lack of Transportation (Non-Medical): No  Physical Activity: Inactive (04/19/2018)   Exercise Vital Sign    Days of Exercise per Week: 0 days    Minutes of Exercise per Session: 0 min  Stress: Not on file  Social Connections: Moderately Integrated (04/19/2018)   Social Connection and Isolation Panel [NHANES]    Frequency of Communication with Friends and Family: More than three times a week    Frequency of  Social Gatherings with Friends and Family: Never    Attends Religious Services: More than 4 times per year    Active Member of Clubs or Organizations: No    Attends Archivist Meetings: Never    Marital Status: Married  Human resources officer Violence: Not At Risk (06/05/2022)   Humiliation, Afraid, Rape, and Kick questionnaire    Fear of Current or Ex-Partner: No    Emotionally Abused: No    Physically Abused: No    Sexually Abused: No    FH:  Family History  Problem Relation Age of Onset   Alzheimer's disease Mother    Alzheimer's disease Father    Heart disease Father    Heart attack Father    Cancer - Other Sister     Past Medical History:  Diagnosis Date   Anemia    Aortic stenosis 05/2022   Arthritis    knees   Atrial  fibrillation ([place])    Bilateral renal cysts 03/23/2018   Noted MRI ABD   CAD (coronary artery disease), native coronary artery 06/03/2022   s/p PCI mLAD   Cancer [facility])    right kidney cancer   Cancer ([place])    pancreatic   Cholelithiasis 03/19/2018   noted on CT AB/Pelvis   Diabetes mellitus without complication ([place])    Diverticulosis of colon 04/19/2018   Noted on CT abd/pelvis   Dysrhythmia    afib   ESRD on hemodialysis [facility])    nephrologist-- dr Hollie Salk [name]);  05-01-2018 has not started dialysis, scheduled for AV fistula creation 05-07-2018   GERD (gastroesophageal reflux disease)    Hepatic steatosis 03/19/2018   Mild diffuse, noted on CT AB/Pelvis   History of kidney stones    History of pulmonary embolus (PE) 03/2008   treated with coumadin for 6 months   History of sepsis 04/20/2018   per discharge note , probable UTI   Hypertension    IBS (irritable bowel syndrome)    Nocturia    OSA on CPAP    uses CPAP nightly   Pancreatic lesion    0.4cm cystic per CT 07/ 2019   Peripheral vascular disease ([place])    Pneumonia    x 1   Pulmonary nodule    Solid 31m Right Lower Lobe   Right renal mass 04/10/2018   new dx--  scheduled for nephrectomy 05-16-2018   S/P ureteral stent placement: 04/16/2018 04/19/2018    Current Outpatient Medications  Medication Sig Dispense Refill   allopurinol (ZYLOPRIM) 100 MG tablet Take 1 tablet (100 mg total) by mouth every evening. 30 tablet 0   atorvastatin (LIPITOR) 40 MG tablet Take 1 tablet (40 mg total) by mouth every evening.     blood glucose meter kit and supplies Dispense based on patient and insurance preference. Use up to four times daily as directed. (FOR ICD-10 E10.9, E11.9). 1 each 0   calcitRIOL (ROCALTROL) 0.5 MCG capsule Take 1 capsule (0.5 mcg total) by mouth Every Tuesday,Thursday,and Saturday with dialysis. 30 capsule 0   clopidogrel (PLAVIX) 75 MG tablet Take 1 tablet (75 mg total) by mouth daily with  breakfast. 120 tablet 0   diphenhydramine-acetaminophen (TYLENOL PM) 25-500 MG TABS tablet Take 3 tablets by mouth at bedtime as needed (sleep/headache).     ferric citrate (AURYXIA) 1 GM 210 MG(Fe) tablet Take 1 tablet (210 mg total) by mouth daily with supper. (Patient taking differently: Take 210 mg by mouth 3 (three) times daily with meals.) 270 tablet  fluticasone (FLONASE) 50 MCG/ACT nasal spray Place 1 spray into both nostrils daily as needed for allergies or rhinitis.     glucose blood (ACCU-CHEK GUIDE) test strip USE 1 STRIP TO CHECK FASTING BLOOD SUGAR AND 2 HOURS AFTER LARGEST MEAL 100 each 0   insulin glargine (LANTUS) 100 UNIT/ML injection Inject into the skin daily as needed (low blood glucose 180). Unknow dose to give if needed     loratadine (CLARITIN) 10 MG tablet Take 10 mg by mouth every evening.      Menthol, Topical Analgesic, (BIOFREEZE EX) Apply 1 Application topically as needed (pain).     midodrine (PROAMATINE) 10 MG tablet Take 1 tablet (10 mg total) by mouth 3 (three) times daily as needed (for hypotension/dialysis). AS ordered. Take In the evening 10 mg on Mon. Wed and Friday Take 20 mg on Dialysis days Tues, Thurs and Saturday. 10 mg when he gets there and 10 mg half way through. 270 tablet 3   nitroGLYCERIN (NITROSTAT) 0.4 MG SL tablet Place 1 tablet (0.4 mg total) under the tongue every 5 (five) minutes as needed for chest pain. 14 tablet 12   omeprazole (PRILOSEC OTC) 20 MG tablet Take 40 mg by mouth daily before breakfast.      pembrolizumab (KEYTRUDA) 100 MG/4ML SOLN Inject into the vein every 6 (six) weeks. Infused at providers office     prochlorperazine (COMPAZINE) 10 MG tablet Take 1 tablet (10 mg total) by mouth every 6 (six) hours as needed for nausea or vomiting. 60 tablet 1   Sennosides (SENOKOT EXTRA STRENGTH) 17.2 MG TABS Take 51.6 mg by mouth at bedtime as needed (constipation).     traMADol (ULTRAM) 50 MG tablet TAKE 1 TABLET BY MOUTH EVERY 6 HOURS AS  NEEDED FOR PAIN 30 tablet 0   warfarin (COUMADIN) 3 MG tablet TAKE 1 TO 2 TABLETS BY MOUTH DAILY AS DIRECTED BY  THE  COUMADIN  CLINIC (Patient taking differently: Take 1.5-3 mg by mouth See admin instructions. Take 3 mg by mouth in the evening on Sun/Tues/Wed/Thurs/Sat and 1.5 mg on Mon/Fri) 60 tablet 5   carvedilol (COREG) 6.25 MG tablet Take 1 tablet (6.25 mg total) by mouth 2 (two) times daily. (Patient not taking: Reported on 07/11/2022) 60 tablet 3   No current facility-administered medications for this encounter.    Vitals:   07/11/22 1520  BP: 100/70  Pulse: (!) 119  SpO2: 99%  Weight: 126.5 kg (278 lb 12.8 oz)    PHYSICAL EXAM:  General:  Weak appearing. No resp difficulty HEENT: normal Neck: supple. no JVD. Carotids 2+ bilat; no bruits. No lymphadenopathy or thryomegaly appreciated. Cor: PMI nondisplaced. Regular tachy 2/6 AS Lungs: clear. Decreased at bases Abdomen: soft, nontender, nondistended. No hepatosplenomegaly. No bruits or masses. Good bowel sounds. Extremities: no cyanosis, clubbing, rash, edema  LUE fistula Neuro: alert & orientedx3, cranial nerves grossly intact. moves all 4 extremities w/o difficulty. Affect pleasant   ECG: Sinus tach 119 No ST-T wave abnormalities. Personally reviewed   ASSESSMENT & PLAN:  1. High output HF - he has classic signs and symptoms of progressive high-output HF with high output state on RHC - while development of high-output state is well-described due to AVF, his AVF is not overly prominent and symptoms began just after his nephrectomy - will plan CT C/A/P to look for other sites of shunting. If no other sites located will need to repeat RHC with and without ligation of fistula - will also check TSH, thiamine  and other labs.     2. CAD  - LHC 60% mLAD, RFR positive suggesting hemodynamically significant lesion. S/p PCI + DES - residual 70% mid 1st Diag and 75% prox 2nd Diag management medically - DAPT w/ ASA + Plavix -  on ? blocker + high intensity statin  - No s/s angina   3. Aortic Stenosis - mild-mod on echo, mGradient 18.0 mmHg. EF 55-60% - will need annual echocardiograms for surveillance    4. PAF - in sinus today - on amiodarone + warfarin  - w/ elevated ESR and dyspnea, ? Amiodarone toxicity. Now off amio due to possible concerns for amio toxicity.  - Recheck ESR    5. ESRD - on HD T/Th/Sat - per nephrology    6. RCC w/ lung Mets - followed by Dr. Alen Blew    7. ? CAP - per primary team    8. Type 2DM - per primary team   Total time spent 45 minutes. Over half that time spent discussing above.     Glori Bickers, MD  3:54 PM

## 2022-07-12 LAB — T3, FREE: T3, Free: 2.2 pg/mL (ref 2.0–4.4)

## 2022-07-13 NOTE — Progress Notes (Deleted)
Cardiology Clinic Note   Patient Name: George Lewis. Date of Encounter: 07/13/2022  Primary Care Provider:  Lorrene Reid, PA-C Primary Cardiologist:  Elouise Munroe, MD  Patient [place]. 66 year old male presents the clinic today for follow-up evaluation of his dyspnea.  Past Medical History    Past Medical History:  Diagnosis Date   Anemia    Aortic stenosis 05/2022   Arthritis    knees   Atrial fibrillation ([place])    Bilateral renal cysts 03/23/2018   Noted MRI ABD   CAD (coronary artery disease), native coronary artery 06/03/2022   s/p PCI mLAD   Cancer [facility])    right kidney cancer   Cancer ([place])    pancreatic   Cholelithiasis 03/19/2018   noted on CT AB/Pelvis   Diabetes mellitus without complication ([place])    Diverticulosis of colon 04/19/2018   Noted on CT abd/pelvis   Dysrhythmia    afib   ESRD on hemodialysis [facility])    nephrologist-- dr Hollie Salk [name]);  05-01-2018 has not started dialysis, scheduled for AV fistula creation 05-07-2018   GERD (gastroesophageal reflux disease)    Hepatic steatosis 03/19/2018   Mild diffuse, noted on CT AB/Pelvis   History of kidney stones    History of pulmonary embolus (PE) 03/2008   treated with coumadin for 6 months   History of sepsis 04/20/2018   per discharge note , probable UTI   Hypertension    IBS (irritable bowel syndrome)    Nocturia    OSA on CPAP    uses CPAP nightly   Pancreatic lesion    0.4cm cystic per CT 07/ 2019   Peripheral vascular disease (HCC)    Pneumonia    x 1   Pulmonary nodule    Solid 22m Right Lower Lobe   Right renal mass 04/10/2018   new dx--  scheduled for nephrectomy 05-16-2018   S/P ureteral stent placement: 04/16/2018 04/19/2018   Past Surgical History:  Procedure Laterality Date   AV FISTULA PLACEMENT Left 05/07/2018   Procedure: Creation of Left arm Radiocephalic Fistula;  Surgeon: CMarty Heck MD;  [place]   Service: Vascular;  Laterality: Left;   AV FISTULA PLACEMENT Left 05/15/2019   Procedure: Creation of Brachiocephalic fistula left arm;  Surgeon: CWaynetta Sandy MD;  [place]  Service: Vascular;  Laterality: Left;   BUniversityLeft 07/15/2019   Procedure: BASILIC VEIN TRANSPOSITION SECOND STAGE;  Surgeon: CWaynetta Sandy MD;  [place]  Service: Vascular;  Laterality: Left;   BRONCHIAL BRUSHINGS  08/25/2020   Procedure: BRONCHIAL BRUSHINGS;  Surgeon: IGarner Nash DO;  [facility] [place];  Service: Pulmonary;;   BRONCHIAL NEEDLE ASPIRATION BIOPSY  08/25/2020   Procedure: BRONCHIAL NEEDLE ASPIRATION BIOPSIES;  Surgeon: IGarner Nash DO;  [place];  Service: Pulmonary;;   BRONCHIAL WASHINGS  08/25/2020   Procedure: BRONCHIAL WASHINGS;  Surgeon: IGarner Nash DO;  [facility] [place];  Service: Pulmonary;;   COLONOSCOPY     CORONARY PRESSURE WIRE/FFR WITH 3D MAPPING N/A 06/03/2022   Procedure: Coronary Pressure Wire/FFR w/3D Mapping;  Surgeon: TEarly Osmond MD;  [place] [facility];  Service: Cardiovascular;  Laterality: N/A;   CORONARY STENT INTERVENTION N/A 06/03/2022   Procedure: CORONARY STENT INTERVENTION;  Surgeon: TEarly Osmond MD;  [place] [facility];  Service: Cardiovascular;  Laterality: N/A;   CYSTOSCOPY/RETROGRADE/URETEROSCOPY/STONE EXTRACTION WITH  BASKET  x2 last one 1990s approx.   CYSTOSCOPY/URETEROSCOPY/HOLMIUM LASER/STENT PLACEMENT Left 04/16/2018   Procedure: CYSTOSCOPY/LEFT URETEROSCOPY/LEFT RETROGRADE/STENT PLACEMENT;  Surgeon: Lucas Mallow, MD;  [facility];  Service: Urology;  Laterality: Left;   EUS N/A 05/03/2018   Procedure: UPPER ENDOSCOPIC ULTRASOUND (EUS) RADIAL;  Surgeon: Milus Banister, MD;  [facility];  Service: Gastroenterology;  Laterality: N/A;   EUS N/A 05/03/2018   Procedure: UPPER ENDOSCOPIC ULTRASOUND (EUS) LINEAR;  Surgeon:  Milus Banister, MD;  [facility];  Service: Gastroenterology;  Laterality: N/A;   EUS  10/05/2020   upper GI   EXTRACORPOREAL SHOCK WAVE LITHOTRIPSY  x3  last one 2004 approx.   FINE NEEDLE ASPIRATION N/A 05/03/2018   Procedure: FINE NEEDLE ASPIRATION (FNA) LINEAR;  Surgeon: Milus Banister, MD;  [facility];  Service: Gastroenterology;  Laterality: N/A;   INTERCOSTAL NERVE BLOCK Left 10/16/2020   Procedure: INTERCOSTAL NERVE BLOCK;  Surgeon: Melrose Nakayama, MD;  Location: [place];  Service: Thoracic;  Laterality: Left;   LAPAROSCOPIC NEPHRECTOMY, HAND ASSISTED Right 05/16/2018   Procedure: HAND ASSISTED LAPAROSCOPIC RIGHT NEPHRECTOMY;  Surgeon: Lucas Mallow, MD;  [facility];  Service: Urology;  Laterality: Right;   left index finger attachment  1980s   3-4 surgeries   NODE DISSECTION Left 10/16/2020   Procedure: NODE DISSECTION;  Surgeon: Melrose Nakayama, MD;  Location: [place];  Service: Thoracic;  Laterality: Left;   RIGHT/LEFT HEART CATH AND CORONARY ANGIOGRAPHY N/A 06/03/2022   Procedure: RIGHT/LEFT HEART CATH AND CORONARY ANGIOGRAPHY;  Surgeon: Early Osmond, MD;  Location: [place] [facility];  Service: Cardiovascular;  Laterality: N/A;   TOE SURGERY  1980s   in beween 2nd and 3rd toes cyst removed   UPPER GI ENDOSCOPY     VIDEO BRONCHOSCOPY WITH ENDOBRONCHIAL NAVIGATION Bilateral 08/25/2020   Procedure: VIDEO BRONCHOSCOPY WITH ENDOBRONCHIAL NAVIGATION;  Surgeon: Garner Nash, DO;  Location: [place];  Service: Pulmonary;  Laterality: Bilateral;    Allergies  Allergies  Allergen Reactions   Penicillins Rash and Other (See Comments)    Has patient had a PCN reaction causing immediate rash, facial/tongue/throat swelling, SOB or lightheadedness with hypotension: yes Has patient had a PCN reaction causing severe rash involving mucus membranes or skin necrosis: no Has patient had a PCN reaction that required hospitalization: no Has  patient had a PCN reaction occurring within the last 10 years: no If all of the above answers are "NO", then may proceed with Cephalosporin use.     History of Present Illness    George Lewis. has a PMH of atrial fibrillation on Coumadin, HLD, end-stage renal disease on HD Tuesday Thursday Saturday, type 2 diabetes, renal cell carcinoma, PNA, acute respiratory failure with hypoxemia, pleural effusion, and chest pain.  Echocardiogram 06/06/2022 showed an LVEF of 60-65%, normal RV function, no evidence of intrapulmonary shunting.  He underwent cardiac catheterization on 06/03/2022.  He was noted to have proximal-mid 60% LAD stenosis, second diagonal 75% stenosis, ostial LAD 30%, first diagonal 70%, he received PCI with DES to his mid LAD.  He underwent cardiac catheterization on 10 6.  He noted ongoing numbness and shortness of breath.  He was also noted to have significantly elevated heart rate.  He was noted to have disease in his proximal-mid LAD and received PCI with DES x1.  He was placed on aspirin, Plavix, and Coumadin x1 month.  Plan to stop aspirin at 1 month  time.  He was also started on atorvastatin  and metoprolol succinate 25 mg.  He was ruled out for community-acquired pneumonia.  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  He follows with Dr. Alen Blew for his [place].   He presented to clinic 06/13/22 for follow-up evaluation and stated he continued to have some shortness of breath.  He reported that he did feel somewhat better after his catheterization.  He had stopped taking his metoprolol.  His EKG today showed sinus tachycardia with a rate of 112 bpm.  He indicated that he did not tolerate metoprolol due to his blood pressure being too low.  In the clinic his blood pressure was 110/60.  We reviewed the importance of maintaining normal heart rate.  He and his wife expressed understanding.  I also reviewed that his breathing would improve with a better controlled  heart rate.  He expressed understanding.  I will start carvedilol 6.25 mg twice daily.  He requests that he be seen for Coumadin check today.  I have asked him to increase his physical activity as tolerated, continue to be compliant with dialysis, and we will plan follow-up in 1 month.  He was seen in follow-up on 07/11/2022 by Dr. Haroldine Laws.  He reported he continued to feel well since being discharged from the hospital but did not feel that he was back to his baseline.  He was not sleeping in his recliner.  His weight was down 15 pounds.  He denied lower extremity swelling.  His heart rate was 119 bpm.  He was felt to have classic signs/symptoms of progressive high-output heart failure.  Plan was made for CT to evaluate for shunting.  If no sites of shunting were located repeat right heart cath was recommended with and without ligation of fistula.  Lab work unremarkable.  He presents to the clinic today for follow-up evaluation and states***  Today he denies chest pain, increased shortness of breath, lower extremity edema, fatigue, palpitations, melena, hematuria, hemoptysis, diaphoresis, weakness, presyncope, syncope, orthopnea, and PND.   Coronary artery disease-no chest pain.  With cardiac catheterization on 06/03/2022.  He was noted to have proximal-mid LAD stenosis.  He received PCI with DES x1.  Details above.  Plans for triple therapy for 1 month with aspirin, Coumadin, and Plavix. Stop aspirin on 07/04/2022*** Continue Plavix, atorvastatin  Start carvedilol 6.25 mg twice daily? Heart healthy low-sodium diet Increase physical activity as tolerated  Atrial fibrillation-heart rate today ***112 bpm.  Reports compliance with Coumadin and denies bleeding issues.  Has not been taking metoprolol. Continue  Coumadin ***Start carvedilol 6.25 mg twice daily Avoid triggers caffeine, chocolate, EtOH, dehydration etc.-reviewed Increase physical activity as tolerated Repeat fasting lipids and LFTs in  4-6 weeks.  Hyperlipidemia-LDL 135 on 04/02/21 Continue aspirin, atorvastatin Heart healthy low-sodium high-fiber diet Increase physical activity as tolerated Repeat fasting lipids and lfts   DOE-diagnosed with pneumonitis which was felt to be secondary to [facility] per oncology. Follows with oncology  RCC-follows with oncology.  Tolerating medication well.  Disposition: Follow-up with Dr. Margaretann Loveless or me in 3 months.  Home Medications    Prior to Admission medications   Medication Sig Start Date End Date Taking? Authorizing Provider  allopurinol (ZYLOPRIM) 100 MG tablet Take 1 tablet (100 mg total) by mouth every evening. 04/08/21   Lorrene Reid, PA-C  aspirin EC 81 MG tablet Take 1 tablet (81 mg total) by mouth daily. Swallow whole. 06/06/22 07/06/22  Lavina Hamman, MD  atorvastatin (LIPITOR) 40 MG tablet Take 1 tablet (40 mg total) by mouth every evening. 10/19/20   Gold, Wilder Glade, PA-C  axitinib (INLYTA) 5 MG tablet Take 1 tablet (5 mg total) by mouth 2 (two) times daily. HOLD UNTIL SEEN BY ONCOLOGY 06/06/22   Lavina Hamman, MD  blood glucose meter kit and supplies Dispense based on patient and insurance preference. Use up to four times daily as directed. (FOR ICD-10 E10.9, E11.9). 12/22/18   Edwin Dada, MD  calcitRIOL (ROCALTROL) 0.5 MCG capsule Take 1 capsule (0.5 mcg total) by mouth Every Tuesday,Thursday,and Saturday with dialysis. 06/07/22   Lavina Hamman, MD  clopidogrel (PLAVIX) 75 MG tablet Take 1 tablet (75 mg total) by mouth daily with breakfast. 06/07/22   Lavina Hamman, MD  diphenhydramine-acetaminophen (TYLENOL PM) 25-500 MG TABS tablet Take 3 tablets by mouth at bedtime as needed (sleep/headache).    [provider]  ferric citrate (AURYXIA) 1 GM 210 MG(Fe) tablet Take 1 tablet (210 mg total) by mouth daily with supper. Patient taking differently: Take 210 mg by mouth 3 (three) times daily with meals. 10/19/20   Gold, Wayne E, PA-C  fluticasone  (FLONASE) 50 MCG/ACT nasal spray Place 1 spray into both nostrils daily as needed for allergies or rhinitis.    [provider]  gabapentin (NEURONTIN) 100 MG capsule Take 1 capsule (100 mg total) by mouth 2 (two) times daily. Take 1 PO at night for 5 days then go to twice daily Patient not taking: Reported on 05/31/2022 11/04/20   Melrose Nakayama, MD  glucose blood (ACCU-CHEK GUIDE) test strip USE 1 STRIP TO CHECK FASTING BLOOD SUGAR AND 2 HOURS AFTER LARGEST MEAL 08/02/21   Abonza, Maritza, PA-C  insulin glargine (LANTUS) 100 UNIT/ML injection Inject into the skin daily as needed (low blood glucose 180). Unknow dose to give if needed Patient not taking: Reported on 05/13/2022    [provider]  lidocaine-prilocaine (EMLA) cream Apply topically. Patient not taking: Reported on 05/31/2022 01/11/21   [provider]  loratadine (CLARITIN) 10 MG tablet Take 10 mg by mouth every evening.     [provider]  Menthol, Topical Analgesic, (BIOFREEZE EX) Apply 1 Application topically as needed (pain).    [provider]  metoprolol succinate (TOPROL-XL) 25 MG 24 hr tablet Take 1 tablet (25 mg total) by mouth daily. 06/07/22   Lavina Hamman, MD  midodrine (PROAMATINE) 10 MG tablet Take 1 tablet (10 mg total) by mouth 3 (three) times daily as needed (for hypotension/dialysis). AS ordered. Take In the evening 10 mg on Mon. Wed and Friday Take 20 mg on Dialysis days Tues, Thurs and Saturday. 10 mg when he gets there and 10 mg half way through. Patient not taking: Reported on 05/31/2022 12/03/21   Elouise Munroe, MD  nitroGLYCERIN (NITROSTAT) 0.4 MG SL tablet Place 1 tablet (0.4 mg total) under the tongue every 5 (five) minutes as needed for chest pain. 03/07/19   Swayze, Ava, DO  omeprazole (PRILOSEC OTC) 20 MG tablet Take 40 mg by mouth daily before breakfast.     [provider]  pembrolizumab (KEYTRUDA) 100 MG/4ML SOLN Inject into the vein every 6 (six)  weeks. Infused at providers office 01/07/21   [provider]  prochlorperazine (COMPAZINE) 10 MG tablet Take 1 tablet (10 mg total) by mouth every 6 (six) hours as needed for nausea or vomiting. Patient taking differently: Take 10 mg by mouth as needed for nausea  or vomiting. 03/09/22   Wyatt Portela, MD  Sennosides (SENOKOT EXTRA STRENGTH) 17.2 MG TABS Take 51.6 mg by mouth at bedtime as needed (constipation).    [provider]  traMADol (ULTRAM) 50 MG tablet TAKE 1 TABLET BY MOUTH EVERY 6 HOURS AS NEEDED FOR PAIN Patient taking differently: Take 50 mg by mouth as needed for moderate pain. 02/10/22   Wyatt Portela, MD  warfarin (COUMADIN) 3 MG tablet TAKE 1 TO 2 TABLETS BY MOUTH DAILY AS DIRECTED BY  THE  COUMADIN  CLINIC Patient taking differently: Take 1.5-3 mg by mouth See admin instructions. Take 3 mg by mouth in the evening on Sun/Tues/Wed/Thurs/Sat and 1.5 mg on Mon/Fri 08/12/21   Elouise Munroe, MD    Family History    Family History  Problem Relation Age of Onset   Alzheimer's disease Mother    Alzheimer's disease Father    Heart disease Father    Heart attack Father    Cancer - Other Sister    He indicated that his mother is deceased. He indicated that his father is deceased. He indicated that the status of his sister is unknown.  Social History    Social History   Socioeconomic History   Marital status: Married    Spouse name: Not on file   Number of children: Not on file   Years of education: Not on file   Highest education level: Not on file  Occupational History   Occupation: disabled  Tobacco Use   Smoking status: Never   Smokeless tobacco: Never  Vaping Use   Vaping Use: Never used  Substance and Sexual Activity   Alcohol use: Never   Drug use: Never   Sexual activity: Not on file  Other Topics Concern   Not on file  Social History Narrative   Not on file   Social Determinants of Health   Financial Resource Strain: Unknown  (04/19/2018)   Overall Financial Resource Strain (CARDIA)    Difficulty of Paying Living Expenses: Patient refused  Food Insecurity: No Food Insecurity (06/05/2022)   Hunger Vital Sign    Worried About Running Out of Food in the Last Year: Never true    Ran Out of Food in the Last Year: Never true  Transportation Needs: No Transportation Needs (06/05/2022)   PRAPARE - Hydrologist (Medical): No    Lack of Transportation (Non-Medical): No  Physical Activity: Inactive (04/19/2018)   Exercise Vital Sign    Days of Exercise per Week: 0 days    Minutes of Exercise per Session: 0 min  Stress: Not on file  Social Connections: Moderately Integrated (04/19/2018)   Social Connection and Isolation Panel [NHANES]    Frequency of Communication with Friends and Family: More than three times a week    Frequency of Social Gatherings with Friends and Family: Never    Attends Religious Services: More than 4 times per year    Active Member of [employer] or Organizations: No    Attends Archivist Meetings: Never    Marital Status: Married  Human resources officer Violence: Not At Risk (06/05/2022)   Humiliation, Afraid, Rape, and Kick questionnaire    Fear of Current or Ex-Partner: No    Emotionally Abused: No    Physically Abused: No    Sexually Abused: No     Review of Systems    General:  No chills, fever, night sweats or weight changes.  Cardiovascular:  No chest pain,  dyspnea on exertion, edema, orthopnea, palpitations, paroxysmal nocturnal dyspnea. Dermatological: No rash, lesions/masses Respiratory: No cough, dyspnea Urologic: No hematuria, dysuria Abdominal:   No nausea, vomiting, diarrhea, bright red blood per rectum, melena, or hematemesis Neurologic:  No visual changes, wkns, changes in mental status. All other systems reviewed and are otherwise negative except as noted above.  Physical Exam    VS:  There were no vitals taken for this visit. , BMI There is no  height or weight on file to calculate BMI. GEN: Well nourished, well developed, in no acute distress. HEENT: normal. Neck: Supple, no JVD, carotid bruits, or masses. Cardiac: RRR, no murmurs, rubs, or gallops. No clubbing, cyanosis, edema.  Radials/DP/PT 2+ and equal bilaterally.  Respiratory:  Respirations regular and unlabored, clear to auscultation bilaterally. GI: Soft, nontender, nondistended, BS + x 4. MS: no deformity or atrophy. Skin: warm and dry, no rash. Neuro:  Strength and sensation are intact. Psych: Normal affect.  Accessory Clinical Findings    Recent Labs: 06/04/2022: Magnesium 2.3 07/11/2022: ALT 13; B Natriuretic Peptide 49.2; BUN 53; Creatinine, Ser 11.55; Hemoglobin 12.3; Platelets 359; Potassium 4.1; Sodium 136; TSH 1.742   Recent Lipid Panel    Component Value Date/Time   CHOL 229 (H) 04/02/2021 0908   TRIG 347 (H) 04/02/2021 0908   HDL 31 (L) 04/02/2021 0908   CHOLHDL 7.4 (H) 04/02/2021 0908   LDLCALC 135 (H) 04/02/2021 0908    No BP recorded.  {Refresh Note OR Click here to enter BP  :1}***    ECG personally reviewed by me today-***   ***sinus tachycardia 112 bpm - No acute changes  Echocardiogram 06/06/2022  IMPRESSIONS     1. Left ventricular ejection fraction, by estimation, is 60 to 65%. The  left ventricle has normal function.   2. Right ventricular systolic function is normal. The right ventricular  size is normal.   3. Agitated saline contrast bubble study was negative, with no evidence  of any early (intracardiac) or late (peripheral) intrapulmonary shunting  seen.   4. Limited study for bubble study only.   FINDINGS   Left Ventricle: Left ventricular ejection fraction, by estimation, is 60  to 65%. The left ventricle has normal function.   Right Ventricle: The right ventricular size is normal. Right ventricular  systolic function is normal.   IAS/Shunts: No atrial level shunt detected by color flow Doppler. Agitated  saline  contrast was given intravenously to evaluate for intracardiac  shunting. Agitated saline contrast bubble study was negative, with no  evidence of any interatrial shunt.   Glori Bickers MD  Electronically signed by Glori Bickers MD  Signature Date/Time: 06/06/2022/10:29:51 AM   Cardiac cath 06/03/22    Prox LAD to Mid LAD lesion is 60% stenosed.   2nd Diag lesion is 75% stenosed.   Ost LAD lesion is 30% stenosed.   1st Diag lesion is 70% stenosed.   A stent was successfully placed.   Post intervention, there is a 0% residual stenosis.   1.  RFR positive proximal to mid LAD lesion treated with 1 drug-eluting stent with mild to moderate disease elsewhere (that should be treated medically). 2.  Cardiac output of 20.5 L/min and cardiac index of 8.29 L/min/m due to presence of AV fistula. 3.  Low normal filling pressures with mean RA of 4 mmHg, mean wedge pressure of 5 mmHg, and mean PA pressure of 19 mmHg with LVEDP of 10 mmHg.   Recommendation: Dual antiplatelet therapy for preferably 1 year.  Plavix was administered rather than ticagrelor given the lack of an acute coronary syndrome and given the patient's ongoing dyspnea of uncertain etiology.   Assessment & Plan   1.  ***   Jossie Ng. Rmoni Keplinger NP-C     07/13/2022, 7:55 AM [address] Office [phone] Fax [phone]  Notice: This dictation was prepared with Dragon dictation along with smaller phrase technology. Any transcriptional errors that result from this process are unintentional and may not be corrected upon review.  I spent 14*** minutes examining this patient, reviewing medications, and using patient centered shared decision making involving her cardiac care.  Prior to her visit I spent greater than 20 minutes reviewing her past medical history,  medications, and prior cardiac tests.

## 2022-07-14 ENCOUNTER — Telehealth (HOSPITAL_COMMUNITY): Payer: Self-pay | Admitting: Vascular Surgery

## 2022-07-14 LAB — VITAMIN B1: Vitamin B1 (Thiamine): 128.5 nmol/L (ref 66.5–200.0)

## 2022-07-14 NOTE — Telephone Encounter (Signed)
Left pt detaied message giving CT appt 11/22 @ 3pm , asked pt o call back to confirm appt

## 2022-07-15 ENCOUNTER — Inpatient Hospital Stay: Payer: Medicare Other

## 2022-07-15 ENCOUNTER — Inpatient Hospital Stay (HOSPITAL_BASED_OUTPATIENT_CLINIC_OR_DEPARTMENT_OTHER): Payer: Medicare Other | Admitting: Oncology

## 2022-07-15 ENCOUNTER — Inpatient Hospital Stay: Payer: Medicare Other | Attending: Oncology

## 2022-07-15 ENCOUNTER — Ambulatory Visit: Payer: Medicare Other | Admitting: General Practice

## 2022-07-15 ENCOUNTER — Other Ambulatory Visit: Payer: Self-pay

## 2022-07-15 DIAGNOSIS — N186 End stage renal disease: Secondary | ICD-10-CM | POA: Insufficient documentation

## 2022-07-15 DIAGNOSIS — K529 Noninfective gastroenteritis and colitis, unspecified: Secondary | ICD-10-CM | POA: Insufficient documentation

## 2022-07-15 DIAGNOSIS — C641 Malignant neoplasm of right kidney, except renal pelvis: Secondary | ICD-10-CM

## 2022-07-15 DIAGNOSIS — R000 Tachycardia, unspecified: Secondary | ICD-10-CM | POA: Diagnosis not present

## 2022-07-15 DIAGNOSIS — Z79899 Other long term (current) drug therapy: Secondary | ICD-10-CM | POA: Insufficient documentation

## 2022-07-15 DIAGNOSIS — Z992 Dependence on renal dialysis: Secondary | ICD-10-CM | POA: Diagnosis not present

## 2022-07-15 DIAGNOSIS — R5383 Other fatigue: Secondary | ICD-10-CM | POA: Insufficient documentation

## 2022-07-15 DIAGNOSIS — J90 Pleural effusion, not elsewhere classified: Secondary | ICD-10-CM | POA: Insufficient documentation

## 2022-07-15 DIAGNOSIS — Z7901 Long term (current) use of anticoagulants: Secondary | ICD-10-CM | POA: Diagnosis not present

## 2022-07-15 DIAGNOSIS — C7801 Secondary malignant neoplasm of right lung: Secondary | ICD-10-CM | POA: Diagnosis not present

## 2022-07-15 DIAGNOSIS — Z5112 Encounter for antineoplastic immunotherapy: Secondary | ICD-10-CM | POA: Diagnosis present

## 2022-07-15 DIAGNOSIS — C7802 Secondary malignant neoplasm of left lung: Secondary | ICD-10-CM | POA: Diagnosis not present

## 2022-07-15 DIAGNOSIS — C649 Malignant neoplasm of unspecified kidney, except renal pelvis: Secondary | ICD-10-CM | POA: Diagnosis not present

## 2022-07-15 DIAGNOSIS — I120 Hypertensive chronic kidney disease with stage 5 chronic kidney disease or end stage renal disease: Secondary | ICD-10-CM | POA: Diagnosis not present

## 2022-07-15 DIAGNOSIS — Z7902 Long term (current) use of antithrombotics/antiplatelets: Secondary | ICD-10-CM | POA: Diagnosis not present

## 2022-07-15 LAB — CMP (CANCER CENTER ONLY)
ALT: 7 U/L (ref 0–44)
AST: 16 U/L (ref 15–41)
Albumin: 4.5 g/dL (ref 3.5–5.0)
Alkaline Phosphatase: 64 U/L (ref 38–126)
Anion gap: 16 — ABNORMAL HIGH (ref 5–15)
BUN: 39 mg/dL — ABNORMAL HIGH (ref 8–23)
CO2: 26 mmol/L (ref 22–32)
Calcium: 10.2 mg/dL (ref 8.9–10.3)
Chloride: 92 mmol/L — ABNORMAL LOW (ref 98–111)
Creatinine: 9.49 mg/dL (ref 0.61–1.24)
GFR, Estimated: 6 mL/min — ABNORMAL LOW (ref >60)
Glucose, Bld: 120 mg/dL — ABNORMAL HIGH (ref 70–99)
Potassium: 4 mmol/L (ref 3.5–5.1)
Sodium: 134 mmol/L — ABNORMAL LOW (ref 135–145)
Total Bilirubin: 0.4 mg/dL (ref 0.3–1.2)
Total Protein: 8.8 g/dL — ABNORMAL HIGH (ref 6.5–8.1)

## 2022-07-15 LAB — CBC WITH DIFFERENTIAL (CANCER CENTER ONLY)
Abs Immature Granulocytes: 0.25 10*3/uL — ABNORMAL HIGH (ref 0.00–0.07)
Basophils Absolute: 0.1 10*3/uL (ref 0.0–0.1)
Basophils Relative: 1 %
Eosinophils Absolute: 0.3 10*3/uL (ref 0.0–0.5)
Eosinophils Relative: 3 %
HCT: 38 % — ABNORMAL LOW (ref 39.0–52.0)
Hemoglobin: 12.6 g/dL — ABNORMAL LOW (ref 13.0–17.0)
Immature Granulocytes: 2 %
Lymphocytes Relative: 12 %
Lymphs Abs: 1.6 10*3/uL (ref 0.7–4.0)
MCH: 31.7 pg (ref 26.0–34.0)
MCHC: 33.2 g/dL (ref 30.0–36.0)
MCV: 95.7 fL (ref 80.0–100.0)
Monocytes Absolute: 1.3 10*3/uL — ABNORMAL HIGH (ref 0.1–1.0)
Monocytes Relative: 10 %
Neutro Abs: 9.8 10*3/uL — ABNORMAL HIGH (ref 1.7–7.7)
Neutrophils Relative %: 72 %
Platelet Count: 333 10*3/uL (ref 150–400)
RBC: 3.97 MIL/uL — ABNORMAL LOW (ref 4.22–5.81)
RDW: 14.7 % (ref 11.5–15.5)
WBC Count: 13.4 10*3/uL — ABNORMAL HIGH (ref 4.0–10.5)
nRBC: 0 % (ref 0.0–0.2)

## 2022-07-15 LAB — TSH: TSH: 2.159 u[IU]/mL (ref 0.350–4.500)

## 2022-07-15 MED ORDER — SODIUM CHLORIDE 0.9 % IV SOLN: Freq: Once | INTRAVENOUS | Status: AC

## 2022-07-15 MED ORDER — SODIUM CHLORIDE 0.9 % IV SOLN
400.0000 mg | Freq: Once | INTRAVENOUS | Status: AC
  Administered 2022-07-15: 400 mg via INTRAVENOUS
  Filled 2022-07-15: qty 16

## 2022-07-15 NOTE — Progress Notes (Signed)
Hematology and Oncology Follow Up Visit  George Lewis [id number] 09/09/1955 66 y.o. 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  He initially presented with localized disease in 2019.  Prior Therapy:  He underwent laparoscopic radical nephrectomy completed on May 16, 2018 under the care of Dr. Gloriann Lewis.  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  Histological grade 4.   He underwent robotic assisted left upper lobe wedge resection completed by Dr. Roxan Lewis on October 16, 2020.  The final pathology showed clear-cell renal cell carcinoma with negative margins.    He is status post radiation therapy to the left upper lobe and the right lower lobe pulmonary nodules.  October 2022.   Current therapy: Pembrolizumab 400 mg every 6 weeks started in Jan 13, 2021.  Axitinib 5 mg twice daily started in September of 2023.  Therapy withheld due to acute hospitalization.  Interim History: Mr. George Lewis presents today for a repeat follow-up.  Since the last visit, he started axitinib and took it for 1 week.  He was hospitalized acutely after presenting with symptoms of shortness of breath and difficulty breathing.  His work-up did reveal a small bilateral pleural effusion and proximal to mid George Lewis lesion that was stented.  He did receive aggressive ultrafiltration and echocardiogram showed preserved EF.  This is discharge, he reports improvement in his symptoms and his shortness of breath has improved.  He denies any nausea, vomiting or abdominal pain.  He denies any other complications related to axitinib when he took it.  He denies any diarrhea or weight loss.    Medications: Reviewed without changes. Current Outpatient  Medications  Medication Sig Dispense Refill   allopurinol (ZYLOPRIM) 100 MG tablet Take 1 tablet (100 mg total) by mouth every evening. 30 tablet 0   atorvastatin (LIPITOR) 40 MG tablet Take 1 tablet (40 mg total) by mouth every evening.     blood glucose meter kit and supplies Dispense based on patient and insurance preference. Use up to four times daily as directed. (FOR ICD-10 E10.9, E11.9). 1 each 0   calcitRIOL (ROCALTROL) 0.5 MCG capsule Take 1 capsule (0.5 mcg total) by mouth Every Tuesday,Thursday,and Saturday with dialysis. 30 capsule 0   clopidogrel (PLAVIX) 75 MG tablet Take 1 tablet (75 mg total) by mouth daily with breakfast. 120 tablet 0   diphenhydramine-acetaminophen (TYLENOL PM) 25-500 MG TABS tablet Take 3 tablets by mouth at bedtime as needed (sleep/headache).     ferric citrate (AURYXIA) 1 GM 210 MG(Fe) tablet Take 1 tablet (210 mg total) by mouth daily with supper. (Patient taking differently: Take 210 mg by mouth 3 (three) times daily with meals.) 270 tablet    fluticasone (FLONASE) 50 MCG/ACT nasal spray Place 1 spray into both nostrils daily as needed for allergies or rhinitis.     glucose blood (ACCU-CHEK GUIDE) test strip USE 1 STRIP TO CHECK FASTING BLOOD SUGAR AND 2 HOURS AFTER LARGEST MEAL 100 each 0   insulin glargine (LANTUS) 100 UNIT/ML injection Inject into the skin daily as needed (low blood glucose 180). Unknow dose to give if needed     loratadine (CLARITIN) 10 MG tablet Take 10 mg by mouth every evening.      Menthol, Topical Analgesic, (BIOFREEZE EX) Apply 1 Application topically as needed (pain).  midodrine (PROAMATINE) 10 MG tablet Take 1 tablet (10 mg total) by mouth 3 (three) times daily as needed (for hypotension/dialysis). AS ordered. Take In the evening 10 mg on Mon. Wed and Friday Take 20 mg on Dialysis days Tues, Thurs and Saturday. 10 mg when he gets there and 10 mg half way through. 270 tablet 3   nitroGLYCERIN (NITROSTAT) 0.4 MG SL tablet Place 1  tablet (0.4 mg total) under the tongue every 5 (five) minutes as needed for chest pain. 14 tablet 12   omeprazole (PRILOSEC OTC) 20 MG tablet Take 40 mg by mouth daily before breakfast.      pembrolizumab (KEYTRUDA) 100 MG/4ML SOLN Inject into the vein every 6 (six) weeks. Infused at providers office     prochlorperazine (COMPAZINE) 10 MG tablet Take 1 tablet (10 mg total) by mouth every 6 (six) hours as needed for nausea or vomiting. 60 tablet 1   Sennosides (SENOKOT EXTRA STRENGTH) 17.2 MG TABS Take 51.6 mg by mouth at bedtime as needed (constipation).     traMADol (ULTRAM) 50 MG tablet TAKE 1 TABLET BY MOUTH EVERY 6 HOURS AS NEEDED FOR PAIN 30 tablet 0   warfarin (COUMADIN) 3 MG tablet TAKE 1 TO 2 TABLETS BY MOUTH DAILY AS DIRECTED BY  THE  COUMADIN  CLINIC (Patient taking differently: Take 1.5-3 mg by mouth See admin instructions. Take 3 mg by mouth in the evening on Sun/Tues/Wed/Thurs/Sat and 1.5 mg on Mon/Fri) 60 tablet 5   No current facility-administered medications for this visit.     Allergies:  Allergies  Allergen Reactions   Penicillins Rash and Other (See Comments)    Has patient had a PCN reaction causing immediate rash, facial/tongue/throat swelling, SOB or lightheadedness with hypotension: yes Has patient had a PCN reaction causing severe rash involving mucus membranes or skin necrosis: no Has patient had a PCN reaction that required hospitalization: no Has patient had a PCN reaction occurring within the last 10 years: no If all of the above answers are "NO", then may proceed with Cephalosporin use.       Physical Exam:        Blood pressure 94/61, pulse (!) 116, temperature (!) 96.8 F (36 C), temperature source Temporal, resp. rate 16, weight 274 lb 8 oz (124.5 kg), SpO2 100 %.     ECOG: 1    General appearance: Comfortable appearing without any discomfort Head: Normocephalic without any trauma Oropharynx: Mucous membranes are moist and pink without any  thrush or ulcers. Eyes: Pupils are equal and round reactive to light. Lymph nodes: No cervical, supraclavicular, inguinal or axillary lymphadenopathy.   Heart:regular rate and rhythm.  S1 and S2 without leg edema. Lung: Clear without any rhonchi or wheezes.  No dullness to percussion. Abdomin: Soft, nontender, nondistended with good bowel sounds.  No hepatosplenomegaly. Musculoskeletal: No joint deformity or effusion.  Full range of motion noted. Neurological: No deficits noted on motor, sensory and deep tendon reflex exam. Skin: No petechial rash or dryness.  Appeared moist.             Lab Results: Lab Results  Component Value Date   WBC 13.4 (H) 07/15/2022   HGB 12.6 (L) 07/15/2022   HCT 38.0 (L) 07/15/2022   MCV 95.7 07/15/2022   PLT 333 07/15/2022     Chemistry      Component Value Date/Time   NA 136 07/11/2022 1625   NA 139 04/02/2021 0908   K 4.1 07/11/2022 1625   CL 91 (  L) 07/11/2022 1625   CO2 21 (L) 07/11/2022 1625   BUN 53 (H) 07/11/2022 1625   BUN 29 (H) 04/02/2021 0908   CREATININE 11.55 (H) 07/11/2022 1625   CREATININE 8.63 (HH) 04/22/2022 1119   GLU 96 03/14/2019 0000      Component Value Date/Time   CALCIUM 9.3 07/11/2022 1625   ALKPHOS 55 07/11/2022 1625   AST 18 07/11/2022 1625   AST 16 04/22/2022 1119   ALT 13 07/11/2022 1625   ALT 11 04/22/2022 1119   BILITOT 0.4 07/11/2022 1625   BILITOT 0.4 04/22/2022 1119           Impression and Plan:  66 year old with  1.  Kidney cancer diagnosed in 2019.  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   Treatment options moving forward were discussed at this time.  He is currently on Pembrolizumab single agent with the slight progression of his disease.  He was started on axitinib although was hospitalized 1 week after treatment.  It is unclear if this is axitinib related versus a cardiac event.  Risks and benefits of  restarting axitinib were discussed at this time.  Complications including hypertension, fatigue and diarrhea were reiterated.  After discussion today I have recommended proceeding with 5 mg daily dose of axitinib and will escalate if he has no complications in the future.  If he does not develop further complications we will switch to Lenvima or cabozantinib.   2.  IV access: No issues with peripheral veins noted at this time.   3.  Immune mediated complications: These complications including pneumonitis, colitis and thyroid disease.  He has not experienced any issues at this time.   4.  Antiemetics: No nausea or vomiting reported at this time.  Compazine is available to him.  5.  Chronic kidney disease: He continues to be dialysis dependent.  6.  Tachycardia: Unchanged on examination today.  He rhythm remains regular.  7.  Follow-up: We will return in 6 weeks for follow-up.   30  minutes were dedicated to this visit.  The time was spent on updating disease status, treatment choices and outlining future plan of care review.     Zola Button, MD 11/17/202311:23 AM

## 2022-07-15 NOTE — Progress Notes (Signed)
Per Dr Alen Blew, ok to proceed with treatment today with creat 9.49 mg/dL and HR 110-116

## 2022-07-15 NOTE — Progress Notes (Signed)
CRITICAL VALUE STICKER  CRITICAL VALUE: Creatnine 9.49  RECEIVER (on-site recipient of call): Velna Ochs RN  DATE & TIME NOTIFIED: 07/15/22 @ 1143  MESSENGER (representative from lab): Pam  MD NOTIFIED: Dr Alen Blew  TIME OF NOTIFICATION: 1146  RESPONSE:  MD aware

## 2022-07-16 LAB — T4: T4, Total: 5.7 ug/dL (ref 4.5–12.0)

## 2022-07-20 ENCOUNTER — Ambulatory Visit (HOSPITAL_COMMUNITY)
Admission: RE | Admit: 2022-07-20 | Discharge: 2022-07-20 | Disposition: A | Discharge status: Home / Self Care | Payer: Medicare Other | Source: Ambulatory Visit | Attending: Internal Medicine | Admitting: Internal Medicine

## 2022-07-20 DIAGNOSIS — R935 Abnormal findings on diagnostic imaging of other abdominal regions, including retroperitoneum: Secondary | ICD-10-CM | POA: Diagnosis present

## 2022-07-20 MED ORDER — IOHEXOL 350 MG/ML SOLN
75.0000 mL | Freq: Once | INTRAVENOUS | Status: AC | PRN
  Administered 2022-07-20: 75 mL via INTRAVENOUS

## 2022-07-26 ENCOUNTER — Ambulatory Visit: Payer: Medicare Other | Attending: Cardiology

## 2022-07-26 DIAGNOSIS — I480 Paroxysmal atrial fibrillation: Secondary | ICD-10-CM | POA: Diagnosis not present

## 2022-07-26 DIAGNOSIS — Z7901 Long term (current) use of anticoagulants: Secondary | ICD-10-CM | POA: Diagnosis not present

## 2022-07-26 LAB — POCT INR: INR: 2.8 (ref 2.0–3.0)

## 2022-07-26 NOTE — Patient Instructions (Signed)
Description   Continue taking 1 tablet daily except 0.5 tablet on Mondays and Fridays.  Repeat INR in 6 weeks.  [facility] [phone]

## 2022-07-27 ENCOUNTER — Telehealth (HOSPITAL_COMMUNITY): Payer: Self-pay | Admitting: *Deleted

## 2022-07-27 ENCOUNTER — Other Ambulatory Visit (HOSPITAL_COMMUNITY): Payer: Self-pay | Admitting: *Deleted

## 2022-07-27 NOTE — Telephone Encounter (Signed)
Called patient's wife per Dr. Haroldine Laws with CT results and following instructions:  "No shunts seen. Will need RHC with me."  Patient is scheduled for a Right heart catheterization on Friday, 08/05/22 with Dr.Bensimhon at 07:30 am.   1. Please arrive at the [facility] (Main Entrance A) at [facility]: [place], [place] [address] at 05:30 am. (This time is two hours before your procedure to ensure your preparation). Free valet parking service is available.   Special note: Every effort is made to have your procedure done on time. Please understand that emergencies sometimes delay scheduled procedures.  2. Diet: Do not eat solid foods after midnight.  The patient may have clear liquids until 5am upon the day of the procedure.  3. Take all of your am medications with small sip of water.   4. You MUST have a responsible person to drive you home. 5. Someone MUST be with you the first 24 hours after you arrive home or your discharge will be delayed.  We will cancel his appointment with Dr. Haroldine Laws scheduled 08/03/22.  If you have any questions, please call [phone].

## 2022-07-28 ENCOUNTER — Other Ambulatory Visit: Payer: Self-pay | Admitting: Oncology

## 2022-07-28 ENCOUNTER — Other Ambulatory Visit (HOSPITAL_COMMUNITY): Payer: Self-pay | Admitting: *Deleted

## 2022-07-28 DIAGNOSIS — I480 Paroxysmal atrial fibrillation: Secondary | ICD-10-CM

## 2022-07-28 DIAGNOSIS — I43 Cardiomyopathy in diseases classified elsewhere: Secondary | ICD-10-CM

## 2022-07-28 DIAGNOSIS — I5043 Acute on chronic combined systolic (congestive) and diastolic (congestive) heart failure: Secondary | ICD-10-CM

## 2022-07-28 MED ORDER — SODIUM CHLORIDE 0.9% FLUSH: 3.0000 mL | Freq: Two times a day (BID) | INTRAVENOUS | Status: DC

## 2022-07-28 NOTE — Telephone Encounter (Signed)
Pts wife returned called regarding coumadin for upcoming [place] standard is to hold 5 days prior to procedure but to ensure we have the correct would forward to coumadin clinic for sign off.

## 2022-07-29 NOTE — Telephone Encounter (Signed)
Per Dr Haroldine Laws, no need to hold warfarin for procedure

## 2022-07-29 NOTE — Telephone Encounter (Signed)
LMOM with detailed instructions no need to hold medications for upcoming procedure

## 2022-08-01 ENCOUNTER — Telehealth: Payer: Self-pay | Admitting: Oncology

## 2022-08-01 NOTE — Telephone Encounter (Signed)
Called patient regarding December appointments, patient is notified.

## 2022-08-03 ENCOUNTER — Encounter (HOSPITAL_COMMUNITY): Payer: Medicare Other | Admitting: Internal Medicine

## 2022-08-05 ENCOUNTER — Ambulatory Visit (HOSPITAL_COMMUNITY)
Admission: RE | Admit: 2022-08-05 | Discharge: 2022-08-05 | Disposition: A | Discharge status: Home / Self Care | Payer: Medicare Other | Attending: Internal Medicine | Admitting: Internal Medicine

## 2022-08-05 ENCOUNTER — Other Ambulatory Visit: Payer: Self-pay

## 2022-08-05 ENCOUNTER — Encounter (HOSPITAL_COMMUNITY)
Admission: RE | Disposition: A | Discharge status: Home / Self Care | Payer: Self-pay | Source: Home / Self Care | Attending: Internal Medicine

## 2022-08-05 ENCOUNTER — Telehealth (HOSPITAL_COMMUNITY): Payer: Self-pay | Admitting: *Deleted

## 2022-08-05 ENCOUNTER — Encounter (HOSPITAL_COMMUNITY): Payer: Self-pay | Admitting: Internal Medicine

## 2022-08-05 DIAGNOSIS — I2510 Atherosclerotic heart disease of native coronary artery without angina pectoris: Secondary | ICD-10-CM | POA: Diagnosis not present

## 2022-08-05 DIAGNOSIS — I480 Paroxysmal atrial fibrillation: Secondary | ICD-10-CM | POA: Insufficient documentation

## 2022-08-05 DIAGNOSIS — N186 End stage renal disease: Secondary | ICD-10-CM | POA: Insufficient documentation

## 2022-08-05 DIAGNOSIS — Z85528 Personal history of other malignant neoplasm of kidney: Secondary | ICD-10-CM | POA: Diagnosis not present

## 2022-08-05 DIAGNOSIS — R0602 Shortness of breath: Secondary | ICD-10-CM

## 2022-08-05 DIAGNOSIS — Z992 Dependence on renal dialysis: Secondary | ICD-10-CM | POA: Diagnosis not present

## 2022-08-05 DIAGNOSIS — I5083 High output heart failure: Secondary | ICD-10-CM | POA: Diagnosis present

## 2022-08-05 DIAGNOSIS — G4733 Obstructive sleep apnea (adult) (pediatric): Secondary | ICD-10-CM | POA: Diagnosis not present

## 2022-08-05 DIAGNOSIS — C7800 Secondary malignant neoplasm of unspecified lung: Secondary | ICD-10-CM | POA: Diagnosis not present

## 2022-08-05 DIAGNOSIS — I132 Hypertensive heart and chronic kidney disease with heart failure and with stage 5 chronic kidney disease, or end stage renal disease: Secondary | ICD-10-CM | POA: Insufficient documentation

## 2022-08-05 DIAGNOSIS — Z7902 Long term (current) use of antithrombotics/antiplatelets: Secondary | ICD-10-CM | POA: Insufficient documentation

## 2022-08-05 DIAGNOSIS — Z794 Long term (current) use of insulin: Secondary | ICD-10-CM | POA: Insufficient documentation

## 2022-08-05 DIAGNOSIS — Z905 Acquired absence of kidney: Secondary | ICD-10-CM | POA: Insufficient documentation

## 2022-08-05 DIAGNOSIS — Z79899 Other long term (current) drug therapy: Secondary | ICD-10-CM | POA: Diagnosis not present

## 2022-08-05 DIAGNOSIS — Z7901 Long term (current) use of anticoagulants: Secondary | ICD-10-CM | POA: Insufficient documentation

## 2022-08-05 DIAGNOSIS — I5043 Acute on chronic combined systolic (congestive) and diastolic (congestive) heart failure: Secondary | ICD-10-CM

## 2022-08-05 DIAGNOSIS — E1122 Type 2 diabetes mellitus with diabetic chronic kidney disease: Secondary | ICD-10-CM | POA: Insufficient documentation

## 2022-08-05 DIAGNOSIS — I43 Cardiomyopathy in diseases classified elsewhere: Secondary | ICD-10-CM

## 2022-08-05 DIAGNOSIS — I350 Nonrheumatic aortic (valve) stenosis: Secondary | ICD-10-CM | POA: Insufficient documentation

## 2022-08-05 HISTORY — PX: RIGHT HEART CATH: CATH118263

## 2022-08-05 LAB — POCT I-STAT EG7
Acid-Base Excess: 2 mmol/L (ref 0.0–2.0)
Acid-Base Excess: 2 mmol/L (ref 0.0–2.0)
Acid-Base Excess: 2 mmol/L (ref 0.0–2.0)
Bicarbonate: 27.8 mmol/L (ref 20.0–28.0)
Bicarbonate: 28.6 mmol/L — ABNORMAL HIGH (ref 20.0–28.0)
Bicarbonate: 29 mmol/L — ABNORMAL HIGH (ref 20.0–28.0)
Calcium, Ion: 1.02 mmol/L — ABNORMAL LOW (ref 1.15–1.40)
Calcium, Ion: 1.02 mmol/L — ABNORMAL LOW (ref 1.15–1.40)
Calcium, Ion: 1.04 mmol/L — ABNORMAL LOW (ref 1.15–1.40)
HCT: 34 % — ABNORMAL LOW (ref 39.0–52.0)
HCT: 35 % — ABNORMAL LOW (ref 39.0–52.0)
HCT: 35 % — ABNORMAL LOW (ref 39.0–52.0)
Hemoglobin: 11.6 g/dL — ABNORMAL LOW (ref 13.0–17.0)
Hemoglobin: 11.9 g/dL — ABNORMAL LOW (ref 13.0–17.0)
Hemoglobin: 11.9 g/dL — ABNORMAL LOW (ref 13.0–17.0)
O2 Saturation: 57 %
O2 Saturation: 71 %
O2 Saturation: 73 %
Potassium: 4.2 mmol/L (ref 3.5–5.1)
Potassium: 4.3 mmol/L (ref 3.5–5.1)
Potassium: 4.3 mmol/L (ref 3.5–5.1)
Sodium: 134 mmol/L — ABNORMAL LOW (ref 135–145)
Sodium: 134 mmol/L — ABNORMAL LOW (ref 135–145)
Sodium: 135 mmol/L (ref 135–145)
TCO2: 29 mmol/L (ref 22–32)
TCO2: 30 mmol/L (ref 22–32)
TCO2: 31 mmol/L (ref 22–32)
pCO2, Ven: 48.8 mmHg (ref 44–60)
pCO2, Ven: 50.7 mmHg (ref 44–60)
pCO2, Ven: 53.5 mmHg (ref 44–60)
pH, Ven: 7.342 (ref 7.25–7.43)
pH, Ven: 7.359 (ref 7.25–7.43)
pH, Ven: 7.363 (ref 7.25–7.43)
pO2, Ven: 32 mmHg (ref 32–45)
pO2, Ven: 39 mmHg (ref 32–45)
pO2, Ven: 41 mmHg (ref 32–45)

## 2022-08-05 LAB — PROTIME-INR
INR: 1.3 — ABNORMAL HIGH (ref 0.8–1.2)
Prothrombin Time: 16.1 seconds — ABNORMAL HIGH (ref 11.4–15.2)

## 2022-08-05 LAB — GLUCOSE, CAPILLARY: Glucose-Capillary: 117 mg/dL — ABNORMAL HIGH (ref 70–99)

## 2022-08-05 LAB — SEDIMENTATION RATE: Sed Rate: 102 mm/hr — ABNORMAL HIGH (ref 0–16)

## 2022-08-05 SURGERY — RIGHT HEART CATH
Anesthesia: LOCAL

## 2022-08-05 MED ORDER — ASPIRIN 81 MG PO CHEW: 81.0000 mg | CHEWABLE_TABLET | ORAL | Status: DC

## 2022-08-05 MED ORDER — SODIUM CHLORIDE 0.9% FLUSH: 3.0000 mL | Freq: Two times a day (BID) | INTRAVENOUS | Status: DC

## 2022-08-05 MED ORDER — LABETALOL HCL 5 MG/ML IV SOLN: 10.0000 mg | INTRAVENOUS | Status: DC | PRN

## 2022-08-05 MED ORDER — SODIUM CHLORIDE 0.9 % IV SOLN: 250.0000 mL | INTRAVENOUS | Status: DC | PRN

## 2022-08-05 MED ORDER — HEPARIN SODIUM (PORCINE) 1000 UNIT/ML IJ SOLN
INTRAMUSCULAR | Status: DC | PRN
  Administered 2022-08-05: 3000 [IU] via INTRAVENOUS

## 2022-08-05 MED ORDER — HEPARIN (PORCINE) IN NACL 1000-0.9 UT/500ML-% IV SOLN
INTRAVENOUS | Status: DC | PRN
  Administered 2022-08-05 (×2): 500 mL

## 2022-08-05 MED ORDER — FENTANYL CITRATE (PF) 100 MCG/2ML IJ SOLN
INTRAMUSCULAR | Status: AC
  Filled 2022-08-05: qty 2

## 2022-08-05 MED ORDER — LIDOCAINE HCL (PF) 1 % IJ SOLN
INTRAMUSCULAR | Status: DC | PRN
  Administered 2022-08-05: 10 mL

## 2022-08-05 MED ORDER — PREDNISONE 20 MG PO TABS: 40.0000 mg | ORAL_TABLET | Freq: Every day | ORAL | 0 refills | Status: DC

## 2022-08-05 MED ORDER — ONDANSETRON HCL 4 MG/2ML IJ SOLN: 4.0000 mg | Freq: Four times a day (QID) | INTRAMUSCULAR | Status: DC | PRN

## 2022-08-05 MED ORDER — LIDOCAINE HCL (PF) 1 % IJ SOLN
INTRAMUSCULAR | Status: AC
  Filled 2022-08-05: qty 30

## 2022-08-05 MED ORDER — MIDAZOLAM HCL 2 MG/2ML IJ SOLN
INTRAMUSCULAR | Status: AC
  Filled 2022-08-05: qty 2

## 2022-08-05 MED ORDER — HEPARIN SODIUM (PORCINE) 1000 UNIT/ML IJ SOLN
INTRAMUSCULAR | Status: AC
  Filled 2022-08-05: qty 10

## 2022-08-05 MED ORDER — SODIUM CHLORIDE 0.9% FLUSH: 3.0000 mL | INTRAVENOUS | Status: DC | PRN

## 2022-08-05 MED ORDER — HEPARIN (PORCINE) IN NACL 1000-0.9 UT/500ML-% IV SOLN
INTRAVENOUS | Status: AC
  Filled 2022-08-05: qty 500

## 2022-08-05 MED ORDER — SODIUM CHLORIDE 0.9 % IV SOLN: INTRAVENOUS | Status: DC

## 2022-08-05 MED ORDER — HEPARIN (PORCINE) IN NACL 1000-0.9 UT/500ML-% IV SOLN
INTRAVENOUS | Status: AC
  Filled 2022-08-05: qty 1000

## 2022-08-05 MED ORDER — ACETAMINOPHEN 325 MG PO TABS
650.0000 mg | ORAL_TABLET | ORAL | Status: DC | PRN
  Administered 2022-08-05: 650 mg via ORAL
  Filled 2022-08-05: qty 2

## 2022-08-05 MED ORDER — HYDRALAZINE HCL 20 MG/ML IJ SOLN: 10.0000 mg | INTRAMUSCULAR | Status: DC | PRN

## 2022-08-05 SURGICAL SUPPLY — 8 items
CATH SWAN GANZ 7F STRAIGHT (CATHETERS) IMPLANT
GLIDESHEATH SLENDER 7FR .021G (SHEATH) IMPLANT
GUIDEWIRE .025 260CM (WIRE) IMPLANT
PACK CARDIAC CATHETERIZATION (CUSTOM PROCEDURE TRAY) ×1 IMPLANT
SHEATH PINNACLE 7F 10CM (SHEATH) IMPLANT
SHEATH PROBE COVER 6X72 (BAG) IMPLANT
TRANSDUCER W/STOPCOCK (MISCELLANEOUS) ×1 IMPLANT
WIRE EMERALD 3MM-J .025X260CM (WIRE) IMPLANT

## 2022-08-05 NOTE — Interval H&P Note (Signed)
History and Physical Interval Note:  08/05/2022 8:22 AM  George Lewis.  has presented today for surgery, with the diagnosis of high output heart failure, cad, aortic stenosis.  The various methods of treatment have been discussed with the patient and family. After consideration of risks, benefits and other options for treatment, the patient has consented to  Procedure(s): RIGHT HEART CATH (N/A) with compression of AVF as a surgical intervention.  The patient's history has been reviewed, patient examined, no change in status, stable for surgery.  I have reviewed the patient's chart and labs.  Questions were answered to the patient's satisfaction.     George Lewis

## 2022-08-05 NOTE — Telephone Encounter (Signed)
Per Dr Haroldine Laws, needs Prednisone 40 mg Daily and f/u appt in 3-4 weeks, he called and discussed with pt, rx sent in, will arrange f/u

## 2022-08-05 NOTE — Progress Notes (Signed)
   Discussed results of RHC with AVF compression. He has high output HF and may benefit from AVF modification.   However sed rate now > 100 and concern raised for amio toxicity. Has been off amio. Will start prednisone 40 daily and assess response. If no response can consider AVF modification.   Discussed with Dr. Johnney Ou as well as the patient and his wife.   Glori Bickers, MD  4:07 PM

## 2022-08-08 LAB — POCT I-STAT EG7
Acid-Base Excess: 1 mmol/L (ref 0.0–2.0)
Acid-Base Excess: 1 mmol/L (ref 0.0–2.0)
Acid-Base Excess: 2 mmol/L (ref 0.0–2.0)
Acid-Base Excess: 2 mmol/L (ref 0.0–2.0)
Acid-Base Excess: 2 mmol/L (ref 0.0–2.0)
Acid-Base Excess: 2 mmol/L (ref 0.0–2.0)
Bicarbonate: 27 mmol/L (ref 20.0–28.0)
Bicarbonate: 27.2 mmol/L (ref 20.0–28.0)
Bicarbonate: 27.8 mmol/L (ref 20.0–28.0)
Bicarbonate: 28.2 mmol/L — ABNORMAL HIGH (ref 20.0–28.0)
Bicarbonate: 29.2 mmol/L — ABNORMAL HIGH (ref 20.0–28.0)
Bicarbonate: 28.2 mmol/L — ABNORMAL HIGH (ref 20.0–28.0)
Calcium, Ion: 0.99 mmol/L — ABNORMAL LOW (ref 1.15–1.40)
Calcium, Ion: 1.01 mmol/L — ABNORMAL LOW (ref 1.15–1.40)
Calcium, Ion: 1.02 mmol/L — ABNORMAL LOW (ref 1.15–1.40)
Calcium, Ion: 1.02 mmol/L — ABNORMAL LOW (ref 1.15–1.40)
Calcium, Ion: 1.04 mmol/L — ABNORMAL LOW (ref 1.15–1.40)
Calcium, Ion: 1.01 mmol/L — ABNORMAL LOW (ref 1.15–1.40)
HCT: 33 % — ABNORMAL LOW (ref 39.0–52.0)
HCT: 34 % — ABNORMAL LOW (ref 39.0–52.0)
HCT: 34 % — ABNORMAL LOW (ref 39.0–52.0)
HCT: 35 % — ABNORMAL LOW (ref 39.0–52.0)
HCT: 35 % — ABNORMAL LOW (ref 39.0–52.0)
HCT: 34 % — ABNORMAL LOW (ref 39.0–52.0)
Hemoglobin: 11.2 g/dL — ABNORMAL LOW (ref 13.0–17.0)
Hemoglobin: 11.6 g/dL — ABNORMAL LOW (ref 13.0–17.0)
Hemoglobin: 11.6 g/dL — ABNORMAL LOW (ref 13.0–17.0)
Hemoglobin: 11.9 g/dL — ABNORMAL LOW (ref 13.0–17.0)
Hemoglobin: 11.9 g/dL — ABNORMAL LOW (ref 13.0–17.0)
Hemoglobin: 11.6 g/dL — ABNORMAL LOW (ref 13.0–17.0)
O2 Saturation: 54 %
O2 Saturation: 55 %
O2 Saturation: 71 %
O2 Saturation: 72 %
O2 Saturation: 80 %
O2 Saturation: 50 %
Potassium: 4.2 mmol/L (ref 3.5–5.1)
Potassium: 4.2 mmol/L (ref 3.5–5.1)
Potassium: 4.2 mmol/L (ref 3.5–5.1)
Potassium: 4.3 mmol/L (ref 3.5–5.1)
Potassium: 4.4 mmol/L (ref 3.5–5.1)
Potassium: 4.3 mmol/L (ref 3.5–5.1)
Sodium: 134 mmol/L — ABNORMAL LOW (ref 135–145)
Sodium: 134 mmol/L — ABNORMAL LOW (ref 135–145)
Sodium: 135 mmol/L (ref 135–145)
Sodium: 135 mmol/L (ref 135–145)
Sodium: 136 mmol/L (ref 135–145)
Sodium: 136 mmol/L (ref 135–145)
TCO2: 28 mmol/L (ref 22–32)
TCO2: 29 mmol/L (ref 22–32)
TCO2: 29 mmol/L (ref 22–32)
TCO2: 30 mmol/L (ref 22–32)
TCO2: 31 mmol/L (ref 22–32)
TCO2: 30 mmol/L (ref 22–32)
pCO2, Ven: 47.9 mmHg (ref 44–60)
pCO2, Ven: 47.9 mmHg (ref 44–60)
pCO2, Ven: 48.9 mmHg (ref 44–60)
pCO2, Ven: 51 mmHg (ref 44–60)
pCO2, Ven: 54.6 mmHg (ref 44–60)
pCO2, Ven: 50.6 mmHg (ref 44–60)
pH, Ven: 7.336 (ref 7.25–7.43)
pH, Ven: 7.35 (ref 7.25–7.43)
pH, Ven: 7.359 (ref 7.25–7.43)
pH, Ven: 7.362 (ref 7.25–7.43)
pH, Ven: 7.364 (ref 7.25–7.43)
pH, Ven: 7.354 (ref 7.25–7.43)
pO2, Ven: 31 mmHg — CL (ref 32–45)
pO2, Ven: 31 mmHg — CL (ref 32–45)
pO2, Ven: 39 mmHg (ref 32–45)
pO2, Ven: 40 mmHg (ref 32–45)
pO2, Ven: 47 mmHg — ABNORMAL HIGH (ref 32–45)
pO2, Ven: 28 mmHg — CL (ref 32–45)

## 2022-08-08 LAB — POCT I-STAT 7, (LYTES, BLD GAS, ICA,H+H)
Acid-Base Excess: 2 mmol/L (ref 0.0–2.0)
Bicarbonate: 28.1 mmol/L — ABNORMAL HIGH (ref 20.0–28.0)
Calcium, Ion: 0.98 mmol/L — ABNORMAL LOW (ref 1.15–1.40)
HCT: 34 % — ABNORMAL LOW (ref 39.0–52.0)
Hemoglobin: 11.6 g/dL — ABNORMAL LOW (ref 13.0–17.0)
O2 Saturation: 57 %
Potassium: 4.1 mmol/L (ref 3.5–5.1)
Sodium: 136 mmol/L (ref 135–145)
TCO2: 30 mmol/L (ref 22–32)
pCO2 arterial: 50.7 mmHg — ABNORMAL HIGH (ref 32–48)
pH, Arterial: 7.351 (ref 7.35–7.45)
pO2, Arterial: 32 mmHg — CL (ref 83–108)

## 2022-08-08 LAB — POCT ACTIVATED CLOTTING TIME: Activated Clotting Time: 163 seconds

## 2022-08-19 ENCOUNTER — Other Ambulatory Visit: Payer: Self-pay | Admitting: Internal Medicine

## 2022-08-19 ENCOUNTER — Telehealth: Payer: Self-pay | Admitting: Internal Medicine

## 2022-08-19 DIAGNOSIS — I480 Paroxysmal atrial fibrillation: Secondary | ICD-10-CM

## 2022-08-19 NOTE — Telephone Encounter (Signed)
 *  STAT* If patient is at the pharmacy, call can be transferred to refill team.   1. Which medications need to be refilled? (please list name of each medication and dose if known)   warfarin (COUMADIN) 3 MG tablet    2. Which pharmacy/location (including street and city if local pharmacy) is medication to be sent to? [place], [place]   3. Do they need a 30 day or 90 day supply? 90 days

## 2022-08-24 ENCOUNTER — Ambulatory Visit (HOSPITAL_COMMUNITY)
Admission: RE | Admit: 2022-08-24 | Discharge: 2022-08-24 | Disposition: A | Discharge status: Home / Self Care | Payer: Medicare Other | Source: Ambulatory Visit | Attending: Internal Medicine | Admitting: Internal Medicine

## 2022-08-24 VITALS — BP 120/80 | HR 114 | Wt 271.0 lb

## 2022-08-24 DIAGNOSIS — E1122 Type 2 diabetes mellitus with diabetic chronic kidney disease: Secondary | ICD-10-CM | POA: Insufficient documentation

## 2022-08-24 DIAGNOSIS — Z7901 Long term (current) use of anticoagulants: Secondary | ICD-10-CM | POA: Insufficient documentation

## 2022-08-24 DIAGNOSIS — I350 Nonrheumatic aortic (valve) stenosis: Secondary | ICD-10-CM | POA: Diagnosis not present

## 2022-08-24 DIAGNOSIS — Z79899 Other long term (current) drug therapy: Secondary | ICD-10-CM | POA: Diagnosis not present

## 2022-08-24 DIAGNOSIS — Z7952 Long term (current) use of systemic steroids: Secondary | ICD-10-CM | POA: Diagnosis not present

## 2022-08-24 DIAGNOSIS — I2510 Atherosclerotic heart disease of native coronary artery without angina pectoris: Secondary | ICD-10-CM | POA: Diagnosis not present

## 2022-08-24 DIAGNOSIS — Z992 Dependence on renal dialysis: Secondary | ICD-10-CM | POA: Diagnosis not present

## 2022-08-24 DIAGNOSIS — C7800 Secondary malignant neoplasm of unspecified lung: Secondary | ICD-10-CM | POA: Diagnosis not present

## 2022-08-24 DIAGNOSIS — N186 End stage renal disease: Secondary | ICD-10-CM | POA: Diagnosis not present

## 2022-08-24 DIAGNOSIS — I5083 High output heart failure: Secondary | ICD-10-CM

## 2022-08-24 DIAGNOSIS — Z8249 Family history of ischemic heart disease and other diseases of the circulatory system: Secondary | ICD-10-CM | POA: Diagnosis not present

## 2022-08-24 DIAGNOSIS — R000 Tachycardia, unspecified: Secondary | ICD-10-CM | POA: Diagnosis not present

## 2022-08-24 DIAGNOSIS — Z7902 Long term (current) use of antithrombotics/antiplatelets: Secondary | ICD-10-CM | POA: Diagnosis not present

## 2022-08-24 DIAGNOSIS — J984 Other disorders of lung: Secondary | ICD-10-CM

## 2022-08-24 DIAGNOSIS — Z905 Acquired absence of kidney: Secondary | ICD-10-CM | POA: Insufficient documentation

## 2022-08-24 DIAGNOSIS — Z955 Presence of coronary angioplasty implant and graft: Secondary | ICD-10-CM | POA: Diagnosis not present

## 2022-08-24 DIAGNOSIS — T462X5A Adverse effect of other antidysrhythmic drugs, initial encounter: Secondary | ICD-10-CM

## 2022-08-24 DIAGNOSIS — G4733 Obstructive sleep apnea (adult) (pediatric): Secondary | ICD-10-CM | POA: Diagnosis not present

## 2022-08-24 DIAGNOSIS — Z85528 Personal history of other malignant neoplasm of kidney: Secondary | ICD-10-CM | POA: Diagnosis not present

## 2022-08-24 DIAGNOSIS — I480 Paroxysmal atrial fibrillation: Secondary | ICD-10-CM | POA: Diagnosis present

## 2022-08-24 DIAGNOSIS — I132 Hypertensive heart and chronic kidney disease with heart failure and with stage 5 chronic kidney disease, or end stage renal disease: Secondary | ICD-10-CM | POA: Diagnosis not present

## 2022-08-24 LAB — CBC
HCT: 40.6 % (ref 39.0–52.0)
Hemoglobin: 13.4 g/dL (ref 13.0–17.0)
MCH: 31.5 pg (ref 26.0–34.0)
MCHC: 33 g/dL (ref 30.0–36.0)
MCV: 95.3 fL (ref 80.0–100.0)
Platelets: 260 10*3/uL (ref 150–400)
RBC: 4.26 MIL/uL (ref 4.22–5.81)
RDW: 15.3 % (ref 11.5–15.5)
WBC: 22.3 10*3/uL — ABNORMAL HIGH (ref 4.0–10.5)
nRBC: 0 % (ref 0.0–0.2)

## 2022-08-24 LAB — BRAIN NATRIURETIC PEPTIDE: B Natriuretic Peptide: 63.4 pg/mL (ref 0.0–100.0)

## 2022-08-24 LAB — SEDIMENTATION RATE: Sed Rate: 51 mm/hr — ABNORMAL HIGH (ref 0–16)

## 2022-08-24 MED ORDER — PREDNISONE 10 MG PO TABS: 30.0000 mg | ORAL_TABLET | Freq: Every day | ORAL | 1 refills | Status: AC

## 2022-08-24 MED ORDER — METOPROLOL SUCCINATE ER 25 MG PO TB24: 25.0000 mg | ORAL_TABLET | Freq: Every day | ORAL | 6 refills | Status: AC

## 2022-08-24 NOTE — Patient Instructions (Signed)
Medication Changes:  START Metoprolol XL 25 mg Daily  Decrease Prednisone to 30 mg Daily, we have sent in a new prescription for 30 mg tablets, take 3 daily  Lab Work:  Labs done today, your results will be available in MyChart, we will contact you for abnormal readings.  Testing/Procedures:  none  Referrals:  none  Special Instructions // Education:  Do the following things EVERYDAY: Weigh yourself in the morning before breakfast. Write it down and keep it in a log. Take your medicines as prescribed Eat low salt foods--Limit salt (sodium) to 2000 mg per day.  Stay as active as you can everyday Limit all fluids for the day to less than 2 liters   Follow-Up in: 4-6 weeks   At the [facility], you and your health needs are our priority. We have a designated team specialized in the treatment of Heart Failure. This Care Team includes your primary Heart Failure Specialized Cardiologist (physician), Advanced Practice Providers (APPs- Physician Assistants and Nurse Practitioners), and Pharmacist who all work together to provide you with the care you need, when you need it.   You may see any of the following providers on your designated Care Team at your next follow up:  Dr. Glori Bickers Dr. Loralie Champagne Dr. Roxana Hires, NP Lyda Jester, [place] [facility] [place], [place] Forestine Na, NP Audry Riles, PharmD   Please be sure to bring in all your medications bottles to every appointment.   Need to Contact us:  If you have any questions or concerns before your next appointment please send [place] a message through [place] or call our office at [phone].    TO LEAVE A MESSAGE FOR THE NURSE SELECT OPTION 2, PLEASE LEAVE A MESSAGE INCLUDING: YOUR NAME DATE OF BIRTH CALL BACK NUMBER REASON FOR CALL**this is important as we prioritize the call backs  YOU WILL RECEIVE A CALL BACK THE SAME DAY AS LONG AS YOU CALL BEFORE 4:00  PM

## 2022-08-24 NOTE — Progress Notes (Signed)
ADVANCED HF CLINIC NOTE  PCP: Lorrene Reid, PA-C Primary Cardiologist: Elouise Munroe, MD Nephrology:  Dr. Johnney Ou Oncologist: Dr. Alen Blew  HPI:  George Lewis. 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    Started HD 3 years ago. Initially had AVF in left wrist but this failed immediately. Had HD through  perm cath for a while then established LUE AVF which has worked well since it was placed > 2 years ago.    Reports 2 month h/o mild DOE. Says it got really bad after starting Enlyta about a week ago for his lung CA.    Came to ER with CP and SOB. Hstrop and ECG un remarkable. Echo EF 65% with mild to moderate AS. RV normal size and function. CXR basilar atx    Underwent R/L cath had moderate CAD visually but mid LAD 60 % was hemodynamically significant by FFR so had PCI/stenting   RHC with RA 4 PA mean 19 PCWP 5 PA sat x 1 = 88% (no recheck). CO 20.6 L/min. Thermodilution and shunt run not performed.   During hospitalization concern for amio toxicity. ESR 79. CT without PE or clear evidence of toxicity. Amio stopped. Received prednisone   Underwent RHC with AVF compression on 08/05/22  RA = 6 RV = 36/6 PA = 29/18 (22) PCW = 7 Fick cardiac output/index = 8.1/3.4 TD CO/CI = 9.4/3.9 PVR = 1.7 WU Ao sat = 95% High SVC sat = 54%, 57% RA sat = 80% RV sat = 73% PA sat = 71%, 72% IVC sat = 55%   AVF with BP cuff at 148m HG (systemic SBP 1369mG) PA = 24/10 (16) Fick CO/CI = 4.6/1.9 TD CO/CI = 4.8/2.0 Ao sat = 98% PA sat = 57% RA sat = 50%   BP cuff down PA = 29/13 (21) Fick CO/CI = 7.2/3.0 Thermo CD/CI = 9.1/3.8 Ao sat = 97% PA sat = 71%  ESR 102 -> started on prednisone.   Feels much better since starting on prednisone. Back to 75% of previous of exercise capacity. No edema, orthopnea or PND.    ROS: All systems negative  except as listed in HPI, PMH and Problem List.  SH:  Social History   Socioeconomic History   Marital status: Married    Spouse name: Not on file   Number of children: Not on file   Years of education: Not on file   Highest education level: Not on file  Occupational History   Occupation: disabled  Tobacco Use   Smoking status: Never   Smokeless tobacco: Never  Vaping Use   Vaping Use: Never used  Substance and Sexual Activity   Alcohol use: Never   Drug use: Never   Sexual activity: Not on file  Other Topics Concern   Not on file  Social History Narrative   Not on file   Social Determinants of Health   Financial Resource Strain: Unknown (04/19/2018)   Overall Financial Resource Strain (CARDIA)    Difficulty of Paying Living Expenses: Patient refused  Food Insecurity: No Food Insecurity (06/05/2022)   Hunger Vital Sign    Worried About Running Out of Food in the Last Year: Never true    Ran Out of Food in the Last Year: Never true  Transportation Needs: No Transportation Needs (06/05/2022)  PRAPARE - Hydrologist (Medical): No    Lack of Transportation (Non-Medical): No  Physical Activity: Inactive (04/19/2018)   Exercise Vital Sign    Days of Exercise per Week: 0 days    Minutes of Exercise per Session: 0 min  Stress: Not on file  Social Connections: Moderately Integrated (04/19/2018)   Social Connection and Isolation Panel [NHANES]    Frequency of Communication with Friends and Family: More than three times a week    Frequency of Social Gatherings with Friends and Family: Never    Attends Religious Services: More than 4 times per year    Active Member of Clubs or Organizations: No    Attends Archivist Meetings: Never    Marital Status: Married  Human resources officer Violence: Not At Risk (06/05/2022)   Humiliation, Afraid, Rape, and Kick questionnaire    Fear of Current or Ex-Partner: No    Emotionally Abused: No    Physically  Abused: No    Sexually Abused: No    FH:  Family History  Problem Relation Age of Onset   Alzheimer's disease Mother    Alzheimer's disease Father    Heart disease Father    Heart attack Father    Cancer - Other Sister     Past Medical History:  Diagnosis Date   Anemia    Aortic stenosis 05/2022   Arthritis    knees   Atrial fibrillation ([place])    Bilateral renal cysts 03/23/2018   Noted MRI ABD   CAD (coronary artery disease), native coronary artery 06/03/2022   s/p PCI mLAD   Cancer [facility])    right kidney cancer   Cancer ([place])    pancreatic   Cholelithiasis 03/19/2018   noted on CT AB/Pelvis   Diabetes mellitus without complication ([place])    Diverticulosis of colon 04/19/2018   Noted on CT abd/pelvis   Dysrhythmia    afib   ESRD on hemodialysis [facility])    nephrologist-- dr Hollie Salk [name]);  05-01-2018 has not started dialysis, scheduled for AV fistula creation 05-07-2018   GERD (gastroesophageal reflux disease)    Hepatic steatosis 03/19/2018   Mild diffuse, noted on CT AB/Pelvis   History of kidney stones    History of pulmonary embolus (PE) 03/2008   treated with coumadin for 6 months   History of sepsis 04/20/2018   per discharge note , probable UTI   Hypertension    IBS (irritable bowel syndrome)    Nocturia    OSA on CPAP    uses CPAP nightly   Pancreatic lesion    0.4cm cystic per CT 07/ 2019   Peripheral vascular disease ([place])    Pneumonia    x 1   Pulmonary nodule    Solid 64m Right Lower Lobe   Right renal mass 04/10/2018   new dx--  scheduled for nephrectomy 05-16-2018   S/P ureteral stent placement: 04/16/2018 04/19/2018    Current Outpatient Medications  Medication Sig Dispense Refill   acetaminophen (TYLENOL) 500 MG tablet Take 1,500 mg by mouth every 8 (eight) hours as needed for moderate pain.     allopurinol (ZYLOPRIM) 100 MG tablet Take 1 tablet (100 mg total) by mouth every evening. 30 tablet 0   atorvastatin (LIPITOR) 40 MG  tablet Take 1 tablet (40 mg total) by mouth every evening.     axitinib (INLYTA) 5 MG tablet Take 2.5 mg by mouth 2 (two) times daily.     blood  glucose meter kit and supplies Dispense based on patient and insurance preference. Use up to four times daily as directed. (FOR ICD-10 E10.9, E11.9). 1 each 0   clopidogrel (PLAVIX) 75 MG tablet Take 1 tablet (75 mg total) by mouth daily with breakfast. 120 tablet 0   diphenhydramine-acetaminophen (TYLENOL PM) 25-500 MG TABS tablet Take 3 tablets by mouth at bedtime as needed (sleep/headache).     ferric citrate (AURYXIA) 1 GM 210 MG(Fe) tablet Take 1 tablet (210 mg total) by mouth daily with supper. (Patient taking differently: Take 210 mg by mouth 3 (three) times daily with meals.) 270 tablet    fluticasone (FLONASE) 50 MCG/ACT nasal spray Place 1 spray into both nostrils daily as needed for allergies or rhinitis.     glucose blood (ACCU-CHEK GUIDE) test strip USE 1 STRIP TO CHECK FASTING BLOOD SUGAR AND 2 HOURS AFTER LARGEST MEAL 100 each 0   loperamide (IMODIUM A-D) 2 MG tablet Take 2 mg by mouth 4 (four) times daily as needed for diarrhea or loose stools.     loratadine (CLARITIN) 10 MG tablet Take 10 mg by mouth every evening.      Menthol, Topical Analgesic, (BIOFREEZE EX) Apply 1 Application topically as needed (pain).     midodrine (PROAMATINE) 10 MG tablet Take 1 tablet (10 mg total) by mouth 3 (three) times daily as needed (for hypotension/dialysis). AS ordered. Take In the evening 10 mg on Mon. Wed and Friday Take 20 mg on Dialysis days Tues, Thurs and Saturday. 10 mg when he gets there and 10 mg half way through. (Patient taking differently: Take 10-20 mg by mouth 3 (three) times daily as needed (for hypotension/dialysis). AS ordered. Take In the evening 10 mg on Mon. Wed and Friday (as needed) Take 10-20 mg on Dialysis days Tues, Thurs and Saturday. 10-20 mg when he gets there and 10-20 mg half way through.) 270 tablet 3   nitroGLYCERIN (NITROSTAT)  0.4 MG SL tablet Place 1 tablet (0.4 mg total) under the tongue every 5 (five) minutes as needed for chest pain. 14 tablet 12   omeprazole (PRILOSEC OTC) 20 MG tablet Take 40 mg by mouth daily before breakfast.      pembrolizumab (KEYTRUDA) 100 MG/4ML SOLN Inject into the vein every 6 (six) weeks. Infused at providers office     predniSONE (DELTASONE) 20 MG tablet Take 2 tablets (40 mg total) by mouth daily with breakfast. 60 tablet 0   prochlorperazine (COMPAZINE) 10 MG tablet Take 1 tablet (10 mg total) by mouth every 6 (six) hours as needed for nausea or vomiting. 60 tablet 1   Sennosides (SENOKOT EXTRA STRENGTH) 17.2 MG TABS Take 51.6 mg by mouth at bedtime as needed (constipation).     traMADol (ULTRAM) 50 MG tablet TAKE 1 TABLET BY MOUTH EVERY 6 HOURS AS NEEDED FOR PAIN 30 tablet 0   warfarin (COUMADIN) 3 MG tablet TAKE 1 TO 2 TABLETS BY MOUTH ONCE DAILY AS DIRECTED BY THE COUMADIN CLINIC 90 tablet 0   calcitRIOL (ROCALTROL) 0.5 MCG capsule Take 1 capsule (0.5 mcg total) by mouth Every Tuesday,Thursday,and Saturday with dialysis. (Patient not taking: Reported on 08/24/2022) 30 capsule 0   insulin glargine (LANTUS) 100 UNIT/ML injection Inject into the skin daily as needed (high blood glucose 180). (Patient not taking: Reported on 08/24/2022)     No current facility-administered medications for this encounter.    Vitals:   08/24/22 1416  BP: 120/80  Pulse: (!) 114  SpO2: 97%  Weight:  122.9 kg (271 lb)    PHYSICAL EXAM:  General:  Well appearing. No resp difficulty HEENT: normal Neck: supple. no JVD. Carotids 2+ bilat; no bruits. No lymphadenopathy or thryomegaly appreciated. Cor: PMI nondisplaced. Regular tachy 2/6 SEM Lungs: clear Abdomen: obese soft, nontender, nondistended. No hepatosplenomegaly. No bruits or masses. Good bowel sounds. Extremities: no cyanosis, clubbing, rash, edema Neuro: alert & orientedx3, cranial nerves grossly intact. moves all 4 extremities w/o  difficulty. Affect pleasant   ECG: Sinus tach 117 No ST-T wave abnormalities. Personally reviewed   ASSESSMENT & PLAN:   1. Amiodarone lung toxicity - symptoms improved with prednisone - recheck ESR - needs slow taper of steroids (6-12 months) - Drop amio to 50m daily  2. High output HF - he has classic signs and symptoms of progressive high-output HF with high output state on RHC - RHC showed improvement with AVF compression but suspect symptoms more likely related to amio toxicity and not high-output HF - Will start low-dose Toprol for tachycardia  3. CAD  - LHC 60% mLAD, RFR positive suggesting hemodynamically significant lesion. S/p PCI + DES - residual 70% mid 1st Diag and 75% prox 2nd Diag management medically - DAPT w/ ASA + Plavix - on ? blocker + high intensity statin  - No s/s angina   4. Aortic Stenosis - mild-mod on echo, mGradient 18.0 mmHg. EF 55-60% - will need annual echocardiograms for surveillance    5. PAF - off amio with pulmonary toxicity - remains in NSR - Continue warfarin   6. ESRD - on HD T/Th/Sat - per nephrology    7. RCC w/ lung Mets - followed by Dr. SLeona Singleton MD  2:57 PM

## 2022-08-26 ENCOUNTER — Inpatient Hospital Stay (HOSPITAL_BASED_OUTPATIENT_CLINIC_OR_DEPARTMENT_OTHER): Payer: Medicare Other | Admitting: Oncology

## 2022-08-26 ENCOUNTER — Other Ambulatory Visit: Payer: Self-pay

## 2022-08-26 ENCOUNTER — Inpatient Hospital Stay: Payer: Medicare Other

## 2022-08-26 ENCOUNTER — Inpatient Hospital Stay: Payer: Medicare Other | Attending: Oncology

## 2022-08-26 DIAGNOSIS — C649 Malignant neoplasm of unspecified kidney, except renal pelvis: Secondary | ICD-10-CM | POA: Insufficient documentation

## 2022-08-26 DIAGNOSIS — C641 Malignant neoplasm of right kidney, except renal pelvis: Secondary | ICD-10-CM

## 2022-08-26 DIAGNOSIS — Z905 Acquired absence of kidney: Secondary | ICD-10-CM | POA: Diagnosis not present

## 2022-08-26 DIAGNOSIS — Z79899 Other long term (current) drug therapy: Secondary | ICD-10-CM | POA: Insufficient documentation

## 2022-08-26 DIAGNOSIS — Z992 Dependence on renal dialysis: Secondary | ICD-10-CM | POA: Diagnosis not present

## 2022-08-26 DIAGNOSIS — N189 Chronic kidney disease, unspecified: Secondary | ICD-10-CM | POA: Diagnosis not present

## 2022-08-26 DIAGNOSIS — C7800 Secondary malignant neoplasm of unspecified lung: Secondary | ICD-10-CM | POA: Diagnosis not present

## 2022-08-26 DIAGNOSIS — Z923 Personal history of irradiation: Secondary | ICD-10-CM | POA: Insufficient documentation

## 2022-08-26 DIAGNOSIS — I480 Paroxysmal atrial fibrillation: Secondary | ICD-10-CM

## 2022-08-26 LAB — CMP (CANCER CENTER ONLY)
ALT: 13 U/L (ref 0–44)
AST: 13 U/L — ABNORMAL LOW (ref 15–41)
Albumin: 3.9 g/dL (ref 3.5–5.0)
Alkaline Phosphatase: 57 U/L (ref 38–126)
Anion gap: 14 (ref 5–15)
BUN: 69 mg/dL — ABNORMAL HIGH (ref 8–23)
CO2: 26 mmol/L (ref 22–32)
Calcium: 9.1 mg/dL (ref 8.9–10.3)
Chloride: 95 mmol/L — ABNORMAL LOW (ref 98–111)
Creatinine: 7.05 mg/dL (ref 0.61–1.24)
GFR, Estimated: 8 mL/min — ABNORMAL LOW (ref >60)
Glucose, Bld: 128 mg/dL — ABNORMAL HIGH (ref 70–99)
Potassium: 4.4 mmol/L (ref 3.5–5.1)
Sodium: 135 mmol/L (ref 135–145)
Total Bilirubin: 0.3 mg/dL (ref 0.3–1.2)
Total Protein: 7.3 g/dL (ref 6.5–8.1)

## 2022-08-26 LAB — CBC WITH DIFFERENTIAL (CANCER CENTER ONLY)
Abs Immature Granulocytes: 0.32 10*3/uL — ABNORMAL HIGH (ref 0.00–0.07)
Basophils Absolute: 0 10*3/uL (ref 0.0–0.1)
Basophils Relative: 0 %
Eosinophils Absolute: 0.1 10*3/uL (ref 0.0–0.5)
Eosinophils Relative: 0 %
HCT: 39 % (ref 39.0–52.0)
Hemoglobin: 13 g/dL (ref 13.0–17.0)
Immature Granulocytes: 2 %
Lymphocytes Relative: 4 %
Lymphs Abs: 0.8 10*3/uL (ref 0.7–4.0)
MCH: 32.3 pg (ref 26.0–34.0)
MCHC: 33.3 g/dL (ref 30.0–36.0)
MCV: 97 fL (ref 80.0–100.0)
Monocytes Absolute: 0.8 10*3/uL (ref 0.1–1.0)
Monocytes Relative: 4 %
Neutro Abs: 17.2 10*3/uL — ABNORMAL HIGH (ref 1.7–7.7)
Neutrophils Relative %: 90 %
Platelet Count: 216 10*3/uL (ref 150–400)
RBC: 4.02 MIL/uL — ABNORMAL LOW (ref 4.22–5.81)
RDW: 15.4 % (ref 11.5–15.5)
WBC Count: 19.1 10*3/uL — ABNORMAL HIGH (ref 4.0–10.5)
nRBC: 0 % (ref 0.0–0.2)

## 2022-08-26 LAB — TSH: TSH: 0.988 u[IU]/mL (ref 0.350–4.500)

## 2022-08-26 MED ORDER — AXITINIB 5 MG PO TABS: ORAL_TABLET | ORAL | 1 refills | Status: DC

## 2022-08-26 NOTE — Progress Notes (Signed)
Hematology and Oncology Follow Up Visit  George Lewis [id number] 01-31-1956 66 y.o. 08/26/2022 11:34 AM Lorrene Reid, PA-CShadad, Mathis Dad, MD   Principle Diagnosis: 36 year old man with kidney cancer diagnosed in 2019.  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    Prior Therapy:  He underwent laparoscopic radical nephrectomy completed on May 16, 2018 under the care of Dr. Gloriann Loan.  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  Histological grade 4.   He underwent robotic assisted left upper lobe wedge resection completed by Dr. Roxan Hockey on October 16, 2020.  The final pathology showed clear-cell renal cell carcinoma with negative margins.    He is status post radiation therapy to the left upper lobe and the right lower lobe pulmonary nodules.  October 2022.  Pembrolizumab 400 mg every 6 weeks started in Jan 13, 2021.  Axitinib 5 mg twice daily started in September of 2023.  Therapy withheld due to acute hospitalization.  Pembrolizumab was discontinued in November 2023 due to disease progression and need for high-dose prednisone.   Current therapy: Axitinib 5 mg daily started in November 2023.  Interim History: George Lewis returns today for repeat follow-up.  Since the last visit, he continues to tolerate axitinib at the current dose without any issues.  He denies any nausea, vomiting or diarrhea.  He did notice increase in his blood pressure but remains close to normal range.  He denies any shortness of breath or difficulty breathing.  He remains on high doses of prednisone related to amiodarone induced toxicity.    Medications: Updated on review. Current Outpatient Medications  Medication Sig Dispense Refill   acetaminophen (TYLENOL) 500 MG tablet Take 1,500 mg by mouth every 8 (eight) hours as  needed for moderate pain.     allopurinol (ZYLOPRIM) 100 MG tablet Take 1 tablet (100 mg total) by mouth every evening. 30 tablet 0   atorvastatin (LIPITOR) 40 MG tablet Take 1 tablet (40 mg total) by mouth every evening.     axitinib (INLYTA) 5 MG tablet Take 2.5 mg by mouth 2 (two) times daily.     blood glucose meter kit and supplies Dispense based on patient and insurance preference. Use up to four times daily as directed. (FOR ICD-10 E10.9, E11.9). 1 each 0   calcitRIOL (ROCALTROL) 0.5 MCG capsule Take 1 capsule (0.5 mcg total) by mouth Every Tuesday,Thursday,and Saturday with dialysis. (Patient not taking: Reported on 08/24/2022) 30 capsule 0   clopidogrel (PLAVIX) 75 MG tablet Take 1 tablet (75 mg total) by mouth daily with breakfast. 120 tablet 0   diphenhydramine-acetaminophen (TYLENOL PM) 25-500 MG TABS tablet Take 3 tablets by mouth at bedtime as needed (sleep/headache).     ferric citrate (AURYXIA) 1 GM 210 MG(Fe) tablet Take 1 tablet (210 mg total) by mouth daily with supper. (Patient taking differently: Take 210 mg by mouth 3 (three) times daily with meals.) 270 tablet    fluticasone (FLONASE) 50 MCG/ACT nasal spray Place 1 spray into both nostrils daily as needed for allergies or rhinitis.     glucose blood (ACCU-CHEK GUIDE) test strip USE 1 STRIP TO CHECK FASTING BLOOD SUGAR AND 2 HOURS AFTER LARGEST MEAL 100 each 0   insulin glargine (LANTUS) 100 UNIT/ML injection Inject into the skin daily as needed (high blood glucose 180). (Patient not taking: Reported on 08/24/2022)     loperamide (  IMODIUM A-D) 2 MG tablet Take 2 mg by mouth 4 (four) times daily as needed for diarrhea or loose stools.     loratadine (CLARITIN) 10 MG tablet Take 10 mg by mouth every evening.      Menthol, Topical Analgesic, (BIOFREEZE EX) Apply 1 Application topically as needed (pain).     metoprolol succinate (TOPROL XL) 25 MG 24 hr tablet Take 1 tablet (25 mg total) by mouth daily. 30 tablet 6   midodrine  (PROAMATINE) 10 MG tablet Take 1 tablet (10 mg total) by mouth 3 (three) times daily as needed (for hypotension/dialysis). AS ordered. Take In the evening 10 mg on Mon. Wed and Friday Take 20 mg on Dialysis days Tues, Thurs and Saturday. 10 mg when he gets there and 10 mg half way through. (Patient taking differently: Take 10-20 mg by mouth 3 (three) times daily as needed (for hypotension/dialysis). AS ordered. Take In the evening 10 mg on Mon. Wed and Friday (as needed) Take 10-20 mg on Dialysis days Tues, Thurs and Saturday. 10-20 mg when he gets there and 10-20 mg half way through.) 270 tablet 3   nitroGLYCERIN (NITROSTAT) 0.4 MG SL tablet Place 1 tablet (0.4 mg total) under the tongue every 5 (five) minutes as needed for chest pain. 14 tablet 12   omeprazole (PRILOSEC OTC) 20 MG tablet Take 40 mg by mouth daily before breakfast.      pembrolizumab (KEYTRUDA) 100 MG/4ML SOLN Inject into the vein every 6 (six) weeks. Infused at providers office     predniSONE (DELTASONE) 10 MG tablet Take 3 tablets (30 mg total) by mouth daily with breakfast. 90 tablet 1   prochlorperazine (COMPAZINE) 10 MG tablet Take 1 tablet (10 mg total) by mouth every 6 (six) hours as needed for nausea or vomiting. 60 tablet 1   Sennosides (SENOKOT EXTRA STRENGTH) 17.2 MG TABS Take 51.6 mg by mouth at bedtime as needed (constipation).     traMADol (ULTRAM) 50 MG tablet TAKE 1 TABLET BY MOUTH EVERY 6 HOURS AS NEEDED FOR PAIN 30 tablet 0   warfarin (COUMADIN) 3 MG tablet TAKE 1 TO 2 TABLETS BY MOUTH ONCE DAILY AS DIRECTED BY THE COUMADIN CLINIC 90 tablet 0   No current facility-administered medications for this visit.     Allergies:  Allergies  Allergen Reactions   Penicillins Rash and Other (See Comments)    Has patient had a PCN reaction causing immediate rash, facial/tongue/throat swelling, SOB or lightheadedness with hypotension: yes Has patient had a PCN reaction causing severe rash involving mucus membranes or skin  necrosis: no Has patient had a PCN reaction that required hospitalization: no Has patient had a PCN reaction occurring within the last 10 years: no If all of the above answers are "NO", then may proceed with Cephalosporin use.       Physical Exam:        Blood pressure 138/72, pulse (!) 118, temperature 97.6 F (36.4 C), temperature source Temporal, resp. rate (!) 24, weight 271 lb 11.2 oz (123.2 kg), SpO2 99 %.     ECOG: 1    General appearance: Alert, awake without any distress. Head: Atraumatic without abnormalities Oropharynx: Without any thrush or ulcers. Eyes: No scleral icterus. Lymph nodes: No lymphadenopathy noted in the cervical, supraclavicular, or axillary nodes Heart:regular rate and rhythm, without any murmurs or gallops.   Lung: Clear to auscultation without any rhonchi, wheezes or dullness to percussion. Abdomin: Soft, nontender without any shifting dullness or ascites. Musculoskeletal: No  clubbing or cyanosis. Neurological: No motor or sensory deficits. Skin: No rashes or lesions.              Lab Results: Lab Results  Component Value Date   WBC 19.1 (H) 08/26/2022   HGB 13.0 08/26/2022   HCT 39.0 08/26/2022   MCV 97.0 08/26/2022   PLT 216 08/26/2022     Chemistry      Component Value Date/Time   NA 136 08/05/2022 0931   NA 139 04/02/2021 0908   K 4.3 08/05/2022 0931   CL 92 (L) 07/15/2022 1103   CO2 26 07/15/2022 1103   BUN 39 (H) 07/15/2022 1103   BUN 29 (H) 04/02/2021 0908   CREATININE 9.49 (HH) 07/15/2022 1103   GLU 96 03/14/2019 0000      Component Value Date/Time   CALCIUM 10.2 07/15/2022 1103   ALKPHOS 64 07/15/2022 1103   AST 16 07/15/2022 1103   ALT 7 07/15/2022 1103   BILITOT 0.4 07/15/2022 1103           Impression and Plan:  66 year old with  1.  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   The natural course of this disease and  treatment options were discussed at this time.  He had slight progression on Pembrolizumab alone and axitinib was added.  Pembrolizumab was permanently discontinued due to need for long-term prednisone therapy.  Complication associated with axitinib that include hypertension, diarrhea and hand-foot syndrome were reiterated.  He has not experienced to have any complications at this time although his blood pressure did increase to normal range.  Based on these findings, I recommended continuing the same dose and schedule and we will update his staging scan before the next visit.  If he has a reasonable response to therapy or stable disease, I would recommend the same dose and schedule.  He has rapid progression of his disease, then switching to a different agent such as cabozantinib or lenvatinib can be considered.    2.  Immune mediated complications: No delayed complications noted at this time.  He has pulmonary toxicity is related to amiodarone.     3.  Chronic kidney disease: He continues to be dialysis dependent without any further deterioration.  4.  Tachycardia and cardiac considerations: He continues to follow with Dr. Haroldine Laws regarding this issue.  5.  Follow-up: In 6 weeks for repeat follow-up and updating of his staging scans.   30  minutes were spent on this encounter.  The time was dedicated to updating disease status, treatment choices and outlining future plan of care review.     Zola Button, MD 12/29/202311:34 AM

## 2022-08-27 LAB — T4: T4, Total: 3.8 ug/dL — ABNORMAL LOW (ref 4.5–12.0)

## 2022-08-31 ENCOUNTER — Other Ambulatory Visit: Payer: Self-pay | Admitting: *Deleted

## 2022-08-31 ENCOUNTER — Telehealth (HOSPITAL_COMMUNITY): Payer: Self-pay

## 2022-08-31 ENCOUNTER — Other Ambulatory Visit (HOSPITAL_COMMUNITY): Payer: Self-pay

## 2022-08-31 MED ORDER — AXITINIB 5 MG PO TABS: ORAL_TABLET | ORAL | 1 refills | Status: DC

## 2022-08-31 NOTE — Telephone Encounter (Signed)
Patients wife called wanting to know if she should only give him the Metoprolol on the non-dialysis on Mondays,Wednesdays,Saturdays and Sundays  CB#[phone]

## 2022-08-31 NOTE — Telephone Encounter (Signed)
Patients wife advised and verbalized understanding.

## 2022-09-01 ENCOUNTER — Other Ambulatory Visit: Payer: Self-pay | Admitting: *Deleted

## 2022-09-01 ENCOUNTER — Other Ambulatory Visit (HOSPITAL_COMMUNITY): Payer: Self-pay

## 2022-09-01 ENCOUNTER — Other Ambulatory Visit: Payer: Self-pay

## 2022-09-01 DIAGNOSIS — C641 Malignant neoplasm of right kidney, except renal pelvis: Secondary | ICD-10-CM

## 2022-09-01 MED ORDER — AXITINIB 5 MG PO TABS
ORAL_TABLET | ORAL | 1 refills | Status: DC
  Filled 2022-09-01: qty 60, fill #0

## 2022-09-01 MED ORDER — AXITINIB 5 MG PO TABS
ORAL_TABLET | ORAL | 0 refills | Status: AC
  Filled 2022-09-01 (×2): qty 60, 30d supply, fill #0

## 2022-09-02 ENCOUNTER — Emergency Department (HOSPITAL_COMMUNITY)
Admission: EM | Admit: 2022-09-02 | Discharge: 2022-09-02 | Disposition: A | Discharge status: Home / Self Care | Payer: Medicare Other | Attending: Emergency Medicine | Admitting: Emergency Medicine

## 2022-09-02 ENCOUNTER — Other Ambulatory Visit: Payer: Self-pay

## 2022-09-02 ENCOUNTER — Telehealth: Payer: Self-pay | Admitting: Internal Medicine

## 2022-09-02 ENCOUNTER — Other Ambulatory Visit (HOSPITAL_COMMUNITY): Payer: Self-pay

## 2022-09-02 DIAGNOSIS — Z85528 Personal history of other malignant neoplasm of kidney: Secondary | ICD-10-CM | POA: Diagnosis not present

## 2022-09-02 DIAGNOSIS — T82590A Other mechanical complication of surgically created arteriovenous fistula, initial encounter: Secondary | ICD-10-CM | POA: Insufficient documentation

## 2022-09-02 DIAGNOSIS — N186 End stage renal disease: Secondary | ICD-10-CM | POA: Diagnosis not present

## 2022-09-02 DIAGNOSIS — Z8507 Personal history of malignant neoplasm of pancreas: Secondary | ICD-10-CM | POA: Diagnosis not present

## 2022-09-02 DIAGNOSIS — Z7902 Long term (current) use of antithrombotics/antiplatelets: Secondary | ICD-10-CM | POA: Diagnosis not present

## 2022-09-02 DIAGNOSIS — I2510 Atherosclerotic heart disease of native coronary artery without angina pectoris: Secondary | ICD-10-CM | POA: Insufficient documentation

## 2022-09-02 DIAGNOSIS — E1122 Type 2 diabetes mellitus with diabetic chronic kidney disease: Secondary | ICD-10-CM | POA: Diagnosis not present

## 2022-09-02 DIAGNOSIS — Z992 Dependence on renal dialysis: Secondary | ICD-10-CM | POA: Diagnosis not present

## 2022-09-02 DIAGNOSIS — Y69 Unspecified misadventure during surgical and medical care: Secondary | ICD-10-CM | POA: Diagnosis not present

## 2022-09-02 DIAGNOSIS — T829XXA Unspecified complication of cardiac and vascular prosthetic device, implant and graft, initial encounter: Secondary | ICD-10-CM

## 2022-09-02 DIAGNOSIS — R791 Abnormal coagulation profile: Secondary | ICD-10-CM

## 2022-09-02 DIAGNOSIS — Z7901 Long term (current) use of anticoagulants: Secondary | ICD-10-CM | POA: Diagnosis not present

## 2022-09-02 DIAGNOSIS — Z79899 Other long term (current) drug therapy: Secondary | ICD-10-CM | POA: Diagnosis not present

## 2022-09-02 DIAGNOSIS — I120 Hypertensive chronic kidney disease with stage 5 chronic kidney disease or end stage renal disease: Secondary | ICD-10-CM | POA: Diagnosis not present

## 2022-09-02 DIAGNOSIS — Z794 Long term (current) use of insulin: Secondary | ICD-10-CM | POA: Insufficient documentation

## 2022-09-02 LAB — COMPREHENSIVE METABOLIC PANEL
ALT: 16 U/L (ref 0–44)
AST: 24 U/L (ref 15–41)
Albumin: 3.5 g/dL (ref 3.5–5.0)
Alkaline Phosphatase: 48 U/L (ref 38–126)
Anion gap: 20 — ABNORMAL HIGH (ref 5–15)
BUN: 62 mg/dL — ABNORMAL HIGH (ref 8–23)
CO2: 24 mmol/L (ref 22–32)
Calcium: 9.4 mg/dL (ref 8.9–10.3)
Chloride: 93 mmol/L — ABNORMAL LOW (ref 98–111)
Creatinine, Ser: 7.29 mg/dL — ABNORMAL HIGH (ref 0.61–1.24)
GFR, Estimated: 8 mL/min — ABNORMAL LOW (ref >60)
Glucose, Bld: 134 mg/dL — ABNORMAL HIGH (ref 70–99)
Potassium: 4.6 mmol/L (ref 3.5–5.1)
Sodium: 137 mmol/L (ref 135–145)
Total Bilirubin: 0.7 mg/dL (ref 0.3–1.2)
Total Protein: 7.1 g/dL (ref 6.5–8.1)

## 2022-09-02 LAB — CBC WITH DIFFERENTIAL/PLATELET
Abs Immature Granulocytes: 0.64 10*3/uL — ABNORMAL HIGH (ref 0.00–0.07)
Abs Immature Granulocytes: 0.87 10*3/uL — ABNORMAL HIGH (ref 0.00–0.07)
Basophils Absolute: 0.1 10*3/uL (ref 0.0–0.1)
Basophils Absolute: 0.1 10*3/uL (ref 0.0–0.1)
Basophils Relative: 0 %
Basophils Relative: 0 %
Eosinophils Absolute: 0.2 10*3/uL (ref 0.0–0.5)
Eosinophils Absolute: 0.1 10*3/uL (ref 0.0–0.5)
Eosinophils Relative: 1 %
Eosinophils Relative: 1 %
HCT: 38.9 % — ABNORMAL LOW (ref 39.0–52.0)
HCT: 38.4 % — ABNORMAL LOW (ref 39.0–52.0)
Hemoglobin: 12.9 g/dL — ABNORMAL LOW (ref 13.0–17.0)
Hemoglobin: 12.6 g/dL — ABNORMAL LOW (ref 13.0–17.0)
Immature Granulocytes: 4 %
Immature Granulocytes: 5 %
Lymphocytes Relative: 16 %
Lymphocytes Relative: 12 %
Lymphs Abs: 2.7 10*3/uL (ref 0.7–4.0)
Lymphs Abs: 2.2 10*3/uL (ref 0.7–4.0)
MCH: 32.1 pg (ref 26.0–34.0)
MCH: 32.1 pg (ref 26.0–34.0)
MCHC: 33.2 g/dL (ref 30.0–36.0)
MCHC: 32.8 g/dL (ref 30.0–36.0)
MCV: 96.8 fL (ref 80.0–100.0)
MCV: 97.7 fL (ref 80.0–100.0)
Monocytes Absolute: 1.5 10*3/uL — ABNORMAL HIGH (ref 0.1–1.0)
Monocytes Absolute: 1.4 10*3/uL — ABNORMAL HIGH (ref 0.1–1.0)
Monocytes Relative: 9 %
Monocytes Relative: 8 %
Neutro Abs: 11.9 10*3/uL — ABNORMAL HIGH (ref 1.7–7.7)
Neutro Abs: 13.3 10*3/uL — ABNORMAL HIGH (ref 1.7–7.7)
Neutrophils Relative %: 70 %
Neutrophils Relative %: 74 %
Platelets: 271 10*3/uL (ref 150–400)
Platelets: 242 10*3/uL (ref 150–400)
RBC: 4.02 MIL/uL — ABNORMAL LOW (ref 4.22–5.81)
RBC: 3.93 MIL/uL — ABNORMAL LOW (ref 4.22–5.81)
RDW: 15.7 % — ABNORMAL HIGH (ref 11.5–15.5)
RDW: 15.6 % — ABNORMAL HIGH (ref 11.5–15.5)
WBC: 16.9 10*3/uL — ABNORMAL HIGH (ref 4.0–10.5)
WBC: 17.9 10*3/uL — ABNORMAL HIGH (ref 4.0–10.5)
nRBC: 0 % (ref 0.0–0.2)
nRBC: 0 % (ref 0.0–0.2)

## 2022-09-02 LAB — TYPE AND SCREEN
ABO/RH(D): A POS
Antibody Screen: NEGATIVE

## 2022-09-02 LAB — PROTIME-INR
INR: 6.6 (ref 0.8–1.2)
Prothrombin Time: 57.4 seconds — ABNORMAL HIGH (ref 11.4–15.2)

## 2022-09-02 MED ORDER — SODIUM CHLORIDE 0.9 % IV BOLUS
500.0000 mL | Freq: Once | INTRAVENOUS | Status: AC
  Administered 2022-09-02: 500 mL via INTRAVENOUS

## 2022-09-02 NOTE — ED Triage Notes (Signed)
Patient reports left upper arm HD fistula bleeding this afternoon after treatment , pressure dressing applied at triage , bleeding controlled , felt lightheaded while at triage .

## 2022-09-02 NOTE — ED Notes (Signed)
Elevated INR result reported to Dr. Roxanne Mins.

## 2022-09-02 NOTE — Telephone Encounter (Signed)
Pt c/o medication issue:  1. Name of Medication:   warfarin (COUMADIN) 3 MG tablet    2. How are you currently taking this medication (dosage and times per day)?   3. Are you having a reaction (difficulty breathing--STAT)?   4. What is your medication issue? Pt spouse states that pt was seen in ER and was told to hold his coumadin for 2 nights and they were told to contact Abigail, RN to see if this is okay or not.

## 2022-09-02 NOTE — Telephone Encounter (Signed)
Called and spoke with wife.  Advised to hold warfarin tonight, tomorrow night, and Sunday night. Take medication on Monday and keep INR appointment on Tuesday.

## 2022-09-02 NOTE — ED Provider Triage Note (Signed)
Emergency Medicine Provider Triage Evaluation Note  George Lewis , a 67 y.o. male  was evaluated in triage.  Pt complains of bleeding fistula.  Had usual diet since treatment today, finished 1 AM.  Has had fairly persistent bleeding from his fistula since treatment.  Wife did get it controlled once with direct pressure but has been oozing since.  He is starting to feel lightheaded.  Feels like in total he may have lost over a pint of blood.  He is on coumadin.  Review of Systems  Positive: Bleeding fistula Negative: fever  Physical Exam  BP 98/79 (BP Location: Right Arm)   Pulse (!) 153   Temp 97.8 F (36.6 C) (Oral)   Resp (!) 28   SpO2 97%  Gen:   Awake, no distress   Resp:  Normal effort  MSK:   Moves extremities without difficulty  Other:  Fistula LUE, thrill present, no active bleeding currently, new dressing applied  Medical Decision Making  Medically screening exam initiated at 12:55 AM.  Appropriate orders placed.  George Lewis. was informed that the remainder of the evaluation will be completed by another provider, this initial triage assessment does not replace that evaluation, and the importance of remaining in the ED until their evaluation is complete.  Bleeding fistula.  No active bleeding at present, new dressing applied.  Will check labs, INR, type and screen.  12:57 AM While in triage, patient had a near syncopal event.  Got very lightheaded, pale, sweaty.  He was reclined in chair, cool cloth placed on forehead, placed back on monitor.  Will keep here for now.   Larene Pickett, PA-C 09/02/22 [phone]

## 2022-09-02 NOTE — ED Provider Notes (Signed)
[facility] EMERGENCY DEPARTMENT Provider Note  CSN: [id number] Arrival date & time: 09/02/22 0019  Chief Complaint(s) Bleeding HD Fistula (Controlled at triage)  HPI George Lewis. is a 67 y.o. male with a history of paroxysmal atrial fibrillation on warfarin, sleep apnea, stage IV clear-cell renal cancer, end-stage renal disease on HD, high-output heart failure, amiodarone induced lung toxicity, coronary artery disease presenting to the emergency department with bleeding from left fistula.  He has a left AV fistula, received dialysis yesterday without event.  He was given heparin which he normally does not receive.  He reports that he went home and noticed that he was having bleeding from his fistula.  He wrapped it with gauze and came to the emergency department.  Patient reports he otherwise feels in his normal state of health.  He reports that his heart rate is always elevated.  He also reports that he always breathes fast since he is at the lung injury from amiodarone and his chemotherapy medication.  He reports that is actually improved since he was started on steroids.  He denies any cough. He denies any fevers or chills.  He denies any nausea, vomiting, diarrhea.  Denies any weakness or lightheadedness.  Denies any chest pain, abdominal pain.  Denies any back pain.     Past Medical History Past Medical History:  Diagnosis Date   Anemia    Aortic stenosis 05/2022   Arthritis    knees   Atrial fibrillation ([place])    Bilateral renal cysts 03/23/2018   Noted MRI ABD   CAD (coronary artery disease), native coronary artery 06/03/2022   s/p PCI mLAD   Cancer [facility])    right kidney cancer   Cancer ([place])    pancreatic   Cholelithiasis 03/19/2018   noted on CT AB/Pelvis   Diabetes mellitus without complication ([place])    Diverticulosis of colon 04/19/2018   Noted on CT abd/pelvis   Dysrhythmia    afib   ESRD on hemodialysis [facility])    nephrologist-- dr Hollie Salk  [name]);  05-01-2018 has not started dialysis, scheduled for AV fistula creation 05-07-2018   GERD (gastroesophageal reflux disease)    Hepatic steatosis 03/19/2018   Mild diffuse, noted on CT AB/Pelvis   History of kidney stones    History of pulmonary embolus (PE) 03/2008   treated with coumadin for 6 months   History of sepsis 04/20/2018   per discharge note , probable UTI   Hypertension    IBS (irritable bowel syndrome)    Nocturia    OSA on CPAP    uses CPAP nightly   Pancreatic lesion    0.4cm cystic per CT 07/ 2019   Peripheral vascular disease (HCC)    Pneumonia    x 1   Pulmonary nodule    Solid 66mm Right Lower Lobe   Right renal mass 04/10/2018   new dx--  scheduled for nephrectomy 05-16-2018   S/P ureteral stent placement: 04/16/2018 04/19/2018   Patient Active Problem List   Diagnosis Date Noted   Acute respiratory failure with hypoxia (HCC)    Pleural effusion    SOB (shortness of breath)    PNA (pneumonia) 05/31/2022   S/P robot-assisted surgical procedure 10/16/2020   Lung nodules 08/12/2020   Secondary hypercoagulable state ([place]) 10/07/2019   Solitary pulmonary nodule 08/08/2019   Long term (current) use of anticoagulants 04/26/2019   Diarrhea, unspecified 04/20/2019   Near syncope 04/20/2019   NSVT (nonsustained ventricular tachycardia) ([place])  04/20/2019   ESRD on hemodialysis ([place]) 04/20/2019   Paroxysmal atrial fibrillation ([place]) 04/20/2019   Hypovolemia 04/20/2019   Coagulation defect, unspecified ([place]) 03/26/2019   Anemia of chronic disease 03/20/2019   Dyspnea, unspecified 03/20/2019   Hyperlipidemia, unspecified 03/20/2019   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 kidney disease, or unspecified chronic kidney disease 03/20/2019   Hypokalemia 03/20/2019   Iron deficiency anemia, unspecified 03/20/2019   Malignant neoplasm of right kidney, except renal pelvis ([place]) 03/20/2019   Secondary  hyperparathyroidism of renal origin ([place]) 03/20/2019   Chest pain 03/06/2019   DM2 (diabetes mellitus, type 2) ([place]) 03/06/2019   Obesity (BMI 30-39.9) 03/06/2019   Goals of care, counseling/discussion 01/28/2019   Class 3 severe obesity due to excess calories with serious comorbidity and body mass index (BMI) of 40.0 to 44.9 in adult ([place]) 01/15/2019   Fatigue 01/15/2019   History of ventricular tachycardia 01/15/2019   DKA (diabetic ketoacidoses) 12/19/2018   Newly diagnosed diabetes ([place]) 12/19/2018   Syncope 06/12/2018   Ileus ([place])    SVT (supraventricular tachycardia)    Renal mass, right 05/16/2018   Mass of pancreas    Abnormal CT of the abdomen    Bacteremia due to Enterococcus 04/23/2018   Sepsis ([place]) 04/19/2018   End stage renal disease ([place]) 04/19/2018   Dehydration 04/19/2018   UTI (urinary tract infection): Probable 04/19/2018   Leukocytosis 04/19/2018   Anemia 04/19/2018   Renal cell carcinoma, right ([place]) 04/19/2018   S/P ureteral stent placement: 04/16/2018 04/19/2018   GERD (gastroesophageal reflux disease) 04/19/2018   OSA on CPAP 04/19/2018   HTN (hypertension) 04/19/2018   Home Medication(s) Prior to Admission medications   Medication Sig Start Date End Date Taking? Authorizing Provider  acetaminophen (TYLENOL) 500 MG tablet Take 1,500 mg by mouth every 8 (eight) hours as needed for moderate pain.    [provider]  allopurinol (ZYLOPRIM) 100 MG tablet Take 1 tablet (100 mg total) by mouth every evening. 04/08/21   Lorrene Reid, PA-C  atorvastatin (LIPITOR) 40 MG tablet Take 1 tablet (40 mg total) by mouth every evening. 10/19/20   Girtha Rm, Wilder Glade, PA-C  axitinib (INLYTA) 5 MG tablet Take one tablet daily 09/01/22   Wyatt Portela, MD  blood glucose meter kit and supplies Dispense based on patient and insurance preference. Use up to four times daily as directed. (FOR ICD-10 E10.9, E11.9). 12/22/18   Edwin Dada, MD  calcitRIOL (ROCALTROL)  0.5 MCG capsule Take 1 capsule (0.5 mcg total) by mouth Every Tuesday,Thursday,and Saturday with dialysis. Patient not taking: Reported on 08/24/2022 06/07/22   Lavina Hamman, MD  clopidogrel (PLAVIX) 75 MG tablet Take 1 tablet (75 mg total) by mouth daily with breakfast. 06/07/22   Lavina Hamman, MD  diphenhydramine-acetaminophen (TYLENOL PM) 25-500 MG TABS tablet Take 3 tablets by mouth at bedtime as needed (sleep/headache).    [provider]  ferric citrate (AURYXIA) 1 GM 210 MG(Fe) tablet Take 1 tablet (210 mg total) by mouth daily with supper. Patient taking differently: Take 210 mg by mouth 3 (three) times daily with meals. 10/19/20   Gold, Wayne E, PA-C  fluticasone (FLONASE) 50 MCG/ACT nasal spray Place 1 spray into both nostrils daily as needed for allergies or rhinitis.    [provider]  glucose blood (ACCU-CHEK GUIDE) test strip USE 1 STRIP TO CHECK FASTING BLOOD SUGAR AND 2 HOURS AFTER LARGEST MEAL 08/02/21   Lorrene Reid, PA-C  insulin  glargine (LANTUS) 100 UNIT/ML injection Inject into the skin daily as needed (high blood glucose 180). Patient not taking: Reported on 08/24/2022    [provider]  loperamide (IMODIUM A-D) 2 MG tablet Take 2 mg by mouth 4 (four) times daily as needed for diarrhea or loose stools.    [provider]  loratadine (CLARITIN) 10 MG tablet Take 10 mg by mouth every evening.     [provider]  Menthol, Topical Analgesic, (BIOFREEZE EX) Apply 1 Application topically as needed (pain).    [provider]  metoprolol succinate (TOPROL XL) 25 MG 24 hr tablet Take 1 tablet (25 mg total) by mouth daily. 08/24/22   Bensimhon, Shaune Pascal, MD  midodrine (PROAMATINE) 10 MG tablet Take 1 tablet (10 mg total) by mouth 3 (three) times daily as needed (for hypotension/dialysis). AS ordered. Take In the evening 10 mg on Mon. Wed and Friday Take 20 mg on Dialysis days Tues, Thurs and Saturday. 10 mg when he gets there  and 10 mg half way through. Patient taking differently: Take 10-20 mg by mouth 3 (three) times daily as needed (for hypotension/dialysis). AS ordered. Take In the evening 10 mg on Mon. Wed and Friday (as needed) Take 10-20 mg on Dialysis days Tues, Thurs and Saturday. 10-20 mg when he gets there and 10-20 mg half way through. 12/03/21   Elouise Munroe, MD  nitroGLYCERIN (NITROSTAT) 0.4 MG SL tablet Place 1 tablet (0.4 mg total) under the tongue every 5 (five) minutes as needed for chest pain. 03/07/19   Swayze, Ava, DO  omeprazole (PRILOSEC OTC) 20 MG tablet Take 40 mg by mouth daily before breakfast.     [provider]  pembrolizumab (KEYTRUDA) 100 MG/4ML SOLN Inject into the vein every 6 (six) weeks. Infused at providers office 01/07/21   [provider]  predniSONE (DELTASONE) 10 MG tablet Take 3 tablets (30 mg total) by mouth daily with breakfast. 08/24/22   Bensimhon, Shaune Pascal, MD  prochlorperazine (COMPAZINE) 10 MG tablet Take 1 tablet (10 mg total) by mouth every 6 (six) hours as needed for nausea or vomiting. 03/09/22   Wyatt Portela, MD  Sennosides (SENOKOT EXTRA STRENGTH) 17.2 MG TABS Take 51.6 mg by mouth at bedtime as needed (constipation).    [provider]  traMADol (ULTRAM) 50 MG tablet TAKE 1 TABLET BY MOUTH EVERY 6 HOURS AS NEEDED FOR PAIN 07/29/22   Wyatt Portela, MD  warfarin (COUMADIN) 3 MG tablet TAKE 1 TO 2 TABLETS BY MOUTH ONCE DAILY AS DIRECTED BY THE COUMADIN CLINIC 08/19/22   Elouise Munroe, MD                                                                                                                                    Past Surgical History Past Surgical History:  Procedure Laterality Date   AV FISTULA PLACEMENT Left 05/07/2018   Procedure:  Creation of Left arm Radiocephalic Fistula;  Surgeon: Marty Heck, MD;  Location: [place];  Service: Vascular;  Laterality: Left;   AV FISTULA PLACEMENT Left 05/15/2019   Procedure: Creation of  Brachiocephalic fistula left arm;  Surgeon: Waynetta Sandy, MD;  Location: [place];  Service: Vascular;  Laterality: Left;   [place] Left 07/15/2019   Procedure: BASILIC VEIN TRANSPOSITION SECOND STAGE;  Surgeon: Waynetta Sandy, MD;  Location: [place];  Service: Vascular;  Laterality: Left;   BRONCHIAL BRUSHINGS  08/25/2020   Procedure: BRONCHIAL BRUSHINGS;  Surgeon: Garner Nash, DO;  Location: [place] [facility];  Service: Pulmonary;;   BRONCHIAL NEEDLE ASPIRATION BIOPSY  08/25/2020   Procedure: BRONCHIAL NEEDLE ASPIRATION BIOPSIES;  Surgeon: Garner Nash, DO;  Location: [place] [facility];  Service: Pulmonary;;   BRONCHIAL WASHINGS  08/25/2020   Procedure: BRONCHIAL WASHINGS;  Surgeon: Garner Nash, DO;  Location: [place] [facility];  Service: Pulmonary;;   COLONOSCOPY     CORONARY PRESSURE WIRE/FFR WITH 3D MAPPING N/A 06/03/2022   Procedure: Coronary Pressure Wire/FFR w/3D Mapping;  Surgeon: Early Osmond, MD;  Location: [place] [facility];  Service: Cardiovascular;  Laterality: N/A;   CORONARY STENT INTERVENTION N/A 06/03/2022   Procedure: CORONARY STENT INTERVENTION;  Surgeon: Early Osmond, MD;  Location: [place] [facility];  Service: Cardiovascular;  Laterality: N/A;   CYSTOSCOPY/RETROGRADE/URETEROSCOPY/STONE EXTRACTION WITH BASKET  x2 last one 1990s approx.   CYSTOSCOPY/URETEROSCOPY/HOLMIUM LASER/STENT PLACEMENT Left 04/16/2018   Procedure: CYSTOSCOPY/LEFT URETEROSCOPY/LEFT RETROGRADE/STENT PLACEMENT;  Surgeon: Lucas Mallow, MD;  [facility];  Service: Urology;  Laterality: Left;   EUS N/A 05/03/2018   Procedure: UPPER ENDOSCOPIC ULTRASOUND (EUS) RADIAL;  Surgeon: Milus Banister, MD;  [facility];  Service: Gastroenterology;  Laterality: N/A;   EUS N/A 05/03/2018   Procedure: UPPER ENDOSCOPIC ULTRASOUND (EUS) LINEAR;  Surgeon: Milus Banister, MD;  [facility];  Service: Gastroenterology;  Laterality: N/A;   EUS   10/05/2020   upper GI   EXTRACORPOREAL SHOCK WAVE LITHOTRIPSY  x3  last one 2004 approx.   FINE NEEDLE ASPIRATION N/A 05/03/2018   Procedure: FINE NEEDLE ASPIRATION (FNA) LINEAR;  Surgeon: Milus Banister, MD;  [facility];  Service: Gastroenterology;  Laterality: N/A;   INTERCOSTAL NERVE BLOCK Left 10/16/2020   Procedure: INTERCOSTAL NERVE BLOCK;  Surgeon: Melrose Nakayama, MD;  Location: [place];  Service: Thoracic;  Laterality: Left;   LAPAROSCOPIC NEPHRECTOMY, HAND ASSISTED Right 05/16/2018   Procedure: HAND ASSISTED LAPAROSCOPIC RIGHT NEPHRECTOMY;  Surgeon: Lucas Mallow, MD;  Location: [facility];  Service: Urology;  Laterality: Right;   left index finger attachment  1980s   3-4 surgeries   NODE DISSECTION Left 10/16/2020   Procedure: NODE DISSECTION;  Surgeon: Melrose Nakayama, MD;  Location: [place];  Service: Thoracic;  Laterality: Left;   RIGHT HEART CATH N/A 08/05/2022   Procedure: RIGHT HEART CATH;  Surgeon: Jolaine Artist, MD;  Location: [place] [facility];  Service: Cardiovascular;  Laterality: N/A;   RIGHT/LEFT HEART CATH AND CORONARY ANGIOGRAPHY N/A 06/03/2022   Procedure: RIGHT/LEFT HEART CATH AND CORONARY ANGIOGRAPHY;  Surgeon: Early Osmond, MD;  Location: [place] [facility];  Service: Cardiovascular;  Laterality: N/A;   TOE SURGERY  1980s   in beween 2nd and 3rd toes cyst removed   UPPER GI ENDOSCOPY     VIDEO BRONCHOSCOPY WITH ENDOBRONCHIAL NAVIGATION Bilateral 08/25/2020   Procedure: VIDEO BRONCHOSCOPY WITH ENDOBRONCHIAL NAVIGATION;  Surgeon:  Garner Nash, DO;  Location: [place] [facility];  Service: Pulmonary;  Laterality: Bilateral;   Family History Family History  Problem Relation Age of Onset   Alzheimer's disease Mother    Alzheimer's disease Father    Heart disease Father    Heart attack Father    Cancer - Other Sister     Social History Social History   Tobacco Use   Smoking status: Never   Smokeless tobacco: Never  Vaping Use    Vaping Use: Never used  Substance Use Topics   Alcohol use: Never   Drug use: Never   Allergies Penicillins  Review of Systems Review of Systems  All other systems reviewed and are negative.   Physical Exam Vital Signs  I have reviewed the triage vital signs BP 127/78   Pulse (!) 113   Temp 98 F (36.7 C)   Resp (!) 33   SpO2 96%  Physical Exam Vitals and nursing note reviewed.  Constitutional:      General: He is not in acute distress.    Appearance: Normal appearance.  HENT:     Mouth/Throat:     Mouth: Mucous membranes are moist.  Eyes:     Conjunctiva/sclera: Conjunctivae normal.  Cardiovascular:     Rate and Rhythm: Regular rhythm. Tachycardia present.  Pulmonary:     Effort: Pulmonary effort is normal. No respiratory distress.     Breath sounds: Normal breath sounds.     Comments: Mild tachpnea, diffuse coarse breath sounds Abdominal:     General: Abdomen is flat.     Palpations: Abdomen is soft.     Tenderness: There is no abdominal tenderness.  Musculoskeletal:     Right lower leg: No edema.     Left lower leg: No edema.     Comments: Left upper extremity fistula with no active bleeding, palpable thrill, no tenderness, no surrounding erythema or warmth  Skin:    General: Skin is warm and dry.     Capillary Refill: Capillary refill takes less than 2 seconds.  Neurological:     Mental Status: He is alert and oriented to person, place, and time. Mental status is at baseline.  Psychiatric:        Mood and Affect: Mood normal.        Behavior: Behavior normal.     ED Results and Treatments Labs (all labs ordered are listed, but only abnormal results are displayed) Labs Reviewed  CBC WITH DIFFERENTIAL/PLATELET - Abnormal; Notable for the following components:      Result Value   WBC 16.9 (*)    RBC 4.02 (*)    Hemoglobin 12.9 (*)    HCT 38.9 (*)    RDW 15.7 (*)    Neutro Abs 11.9 (*)    Monocytes Absolute 1.5 (*)    Abs Immature Granulocytes  0.64 (*)    All other components within normal limits  COMPREHENSIVE METABOLIC PANEL - Abnormal; Notable for the following components:   Chloride 93 (*)    Glucose, Bld 134 (*)    BUN 62 (*)    Creatinine, Ser 7.29 (*)    GFR, Estimated 8 (*)    Anion gap 20 (*)    All other components within normal limits  PROTIME-INR - Abnormal; Notable for the following components:   Prothrombin Time 57.4 (*)    INR 6.6 (*)    All other components within normal limits  CBC WITH DIFFERENTIAL/PLATELET - Abnormal; Notable for the following components:  WBC 17.9 (*)    RBC 3.93 (*)    Hemoglobin 12.6 (*)    HCT 38.4 (*)    RDW 15.6 (*)    Neutro Abs 13.3 (*)    Monocytes Absolute 1.4 (*)    Abs Immature Granulocytes 0.87 (*)    All other components within normal limits  TYPE AND SCREEN                                                                                                                          Radiology No results found.  Pertinent labs & imaging results that were available during my care of the patient were reviewed by me and considered in my medical decision making (see MDM for details).  Medications Ordered in ED Medications  sodium chloride 0.9 % bolus 500 mL (0 mLs Intravenous Stopped 09/02/22 3235)                                                                                                                                     Procedures Procedures  (including critical care time)  Medical Decision Making / ED Course   MDM:  67 year old male presenting to the emergency department with bleeding from fistula.  In the emergency department, patient with tachycardia, tachypnea.  Had isolated reading of hypotension.  No fevers.  He appears well, he has no active fistula bleeding.  Suspect bleeding fistula likely from combination of supratherapeutic INR, received heparin in dialysis.  No further bleeding witnessed.  He has no other symptoms of bleeding such as bloody stools,  nosebleed, hemoptysis, melena.  No headaches.  Hemoglobin checked twice and stable.  Since patient has had no further bleeding, will discharge with instructions to call Coumadin clinic today.  Patient already held warfarin last night, will instruct to hold this tonight as well.  Patient did have some concerning vital sign abnormalities with tachycardia and tachypnea.  On review of chart, patient has had persistent sinus tachycardia over previous visits and seen cardiology for this.  His EKG shows sinus rhythm currently.  He is tachypneic but not hypoxic and he reports that his breathing is at baseline.  His wife corroborates this.  He had a single isolated low blood pressure reading and improved with 500 cc of fluids.  He adamantly denies any infectious symptoms to suggest underlying infectious process.  His white count is elevated but he is  currently taking steroids and that has been elevated previously.  He clinically appears well.  Since these seem to be more chronic issues, will discharge with extremely strict return precautions.  Discussed observation patient preferred to stay home.      Additional history obtained: -Additional history obtained from family -External records from outside source obtained and reviewed including: Chart review including previous notes, labs, imaging, consultation notes including cardiology visit 08/24/22   Lab Tests: -I ordered, reviewed, and interpreted labs.   The pertinent results include:   Labs Reviewed  CBC WITH DIFFERENTIAL/PLATELET - Abnormal; Notable for the following components:      Result Value   WBC 16.9 (*)    RBC 4.02 (*)    Hemoglobin 12.9 (*)    HCT 38.9 (*)    RDW 15.7 (*)    Neutro Abs 11.9 (*)    Monocytes Absolute 1.5 (*)    Abs Immature Granulocytes 0.64 (*)    All other components within normal limits  COMPREHENSIVE METABOLIC PANEL - Abnormal; Notable for the following components:   Chloride 93 (*)    Glucose, Bld 134 (*)    BUN  62 (*)    Creatinine, Ser 7.29 (*)    GFR, Estimated 8 (*)    Anion gap 20 (*)    All other components within normal limits  PROTIME-INR - Abnormal; Notable for the following components:   Prothrombin Time 57.4 (*)    INR 6.6 (*)    All other components within normal limits  CBC WITH DIFFERENTIAL/PLATELET - Abnormal; Notable for the following components:   WBC 17.9 (*)    RBC 3.93 (*)    Hemoglobin 12.6 (*)    HCT 38.4 (*)    RDW 15.6 (*)    Neutro Abs 13.3 (*)    Monocytes Absolute 1.4 (*)    Abs Immature Granulocytes 0.87 (*)    All other components within normal limits  TYPE AND SCREEN    Notable for stable hgb  EKG   EKG Interpretation  Date/Time:  Friday September 02 2022 07:41:28 EST Ventricular Rate:  113 PR Interval:  142 QRS Duration: 78 QT Interval:  326 QTC Calculation: 447 R Axis:   88 Text Interpretation: Sinus tachycardia with Premature atrial complexes with Abberant conduction Otherwise normal ECG No significant change since last tracing Confirmed by Garnette Gunner [phone]) on 09/02/2022 8:45:37 AM         Medicines ordered and prescription drug management: Meds ordered this encounter  Medications   sodium chloride 0.9 % bolus 500 mL    -I have reviewed the patients home medicines and have made adjustments as needed   Cardiac Monitoring: The patient was maintained on a cardiac monitor.  I personally viewed and interpreted the cardiac monitored which showed an underlying rhythm of: sinus tachycardia  Social Determinants of Health:  Diagnosis or treatment significantly limited by social determinants of health: obesity   Reevaluation: After the interventions noted above, I reevaluated the patient and found that they have resolved  Co morbidities that complicate the patient evaluation  Past Medical History:  Diagnosis Date   Anemia    Aortic stenosis 05/2022   Arthritis    knees   Atrial fibrillation ([place])    Bilateral renal cysts 03/23/2018    Noted MRI ABD   CAD (coronary artery disease), native coronary artery 06/03/2022   s/p PCI mLAD   Cancer [facility])    right kidney cancer   Cancer ([place])  pancreatic   Cholelithiasis 03/19/2018   noted on CT AB/Pelvis   Diabetes mellitus without complication ([place])    Diverticulosis of colon 04/19/2018   Noted on CT abd/pelvis   Dysrhythmia    afib   ESRD on hemodialysis [facility])    nephrologist-- dr Hollie Salk [name]);  05-01-2018 has not started dialysis, scheduled for AV fistula creation 05-07-2018   GERD (gastroesophageal reflux disease)    Hepatic steatosis 03/19/2018   Mild diffuse, noted on CT AB/Pelvis   History of kidney stones    History of pulmonary embolus (PE) 03/2008   treated with coumadin for 6 months   History of sepsis 04/20/2018   per discharge note , probable UTI   Hypertension    IBS (irritable bowel syndrome)    Nocturia    OSA on CPAP    uses CPAP nightly   Pancreatic lesion    0.4cm cystic per CT 07/ 2019   Peripheral vascular disease (HCC)    Pneumonia    x 1   Pulmonary nodule    Solid 83mm Right Lower Lobe   Right renal mass 04/10/2018   new dx--  scheduled for nephrectomy 05-16-2018   S/P ureteral stent placement: 04/16/2018 04/19/2018      Dispostion: Disposition decision including need for hospitalization was considered, and patient discharged from emergency department.    Final Clinical Impression(s) / ED Diagnoses Final diagnoses:  Complication of AV dialysis fistula, initial encounter  Elevated INR     This chart was dictated using voice recognition software.  Despite best efforts to proofread,  errors can occur which can change the documentation meaning.    Cristie Hem, MD 09/02/22 [phone]

## 2022-09-02 NOTE — Discharge Instructions (Signed)
We evaluated you for bleeding from your fistula.  Your hemoglobin (red blood cells) was stable over 2 blood tests, and the bleeding has stopped.  Your INR test was elevated.  Please hold your warfarin today and tomorrow and call the Coumadin clinic so that they can update your warfarin schedule and see you early next week.  Please return to the emergency department if you develop any further episodes such as recurrent bleeding, severe nosebleed, rectal bleeding, vomiting blood, coughing blood, or any other concerning symptoms.  Please also return to the emergency department if you develop any new symptoms such as weakness, fevers or chills, diarrhea, vomiting, cough, fatigue, or any other concerning symptoms.

## 2022-09-05 ENCOUNTER — Other Ambulatory Visit (HOSPITAL_COMMUNITY): Payer: Self-pay

## 2022-09-05 ENCOUNTER — Telehealth: Payer: Self-pay | Admitting: Pharmacy Technician

## 2022-09-05 NOTE — Telephone Encounter (Signed)
Oral Oncology Patient Advocate Encounter   Was successful in securing patient a $7,000 grant from [employer] to provide copayment coverage for Inlyta.  This will keep the out of pocket expense at $0.     Additional proof of income must be uploaded to maintain an active grant.    The billing information is as follows and has been shared with [place].   Member ID: [id number] Group ID: [id number] RxBin: [id number] PCN: [id number] Dates of Eligibility: 09/05/22 through 09/06/23  Fund name:  [employer].   [name], CPhT-Adv Oncology Pharmacy Patient [place] Direct Number: [phone]  Fax: [phone]

## 2022-09-06 ENCOUNTER — Telehealth: Payer: Self-pay | Admitting: Pharmacy Technician

## 2022-09-06 ENCOUNTER — Other Ambulatory Visit (HOSPITAL_COMMUNITY): Payer: Self-pay

## 2022-09-06 ENCOUNTER — Ambulatory Visit (INDEPENDENT_AMBULATORY_CARE_PROVIDER_SITE_OTHER): Payer: Medicare Other

## 2022-09-06 DIAGNOSIS — Z5181 Encounter for therapeutic drug level monitoring: Secondary | ICD-10-CM | POA: Diagnosis not present

## 2022-09-06 DIAGNOSIS — I480 Paroxysmal atrial fibrillation: Secondary | ICD-10-CM

## 2022-09-06 DIAGNOSIS — Z7901 Long term (current) use of anticoagulants: Secondary | ICD-10-CM

## 2022-09-06 LAB — POCT INR: INR: 2.4 (ref 2.0–3.0)

## 2022-09-06 NOTE — Patient Instructions (Signed)
Description   START taking 1 tablet daily except 0.5 tablet on Mondays, Wednesdays and Fridays.  Repeat INR in 1 week.  [facility] [phone]

## 2022-09-06 NOTE — Telephone Encounter (Signed)
Oral Oncology Patient Advocate Encounter   Was successful in securing patient an $3,250 grant from Patient [facility]) to provide copayment coverage for Inlyta.  This will keep the out of pocket expense at $0.     I have spoken with the patient.    The billing information is as follows and has been shared with [place].   Member ID: [id number] Group ID: [id number] RxBin: [id number] Dates of Eligibility: 06/08/22 through 09/06/23  Fund:  [place], [place] Patient [place] Direct Number: [phone]  Fax: [phone]

## 2022-09-07 ENCOUNTER — Telehealth: Payer: Self-pay

## 2022-09-07 NOTE — Telephone Encounter (Signed)
        Patient  visited [place]  on 1/5    Telephone encounter attempt :  1st  A HIPAA compliant voice message was left requesting a return call.  Instructed patient to call back.    [place], Care Management  [phone] [address], [place], [place] [address] Phone: [phone] Email: [email or web address]

## 2022-09-07 NOTE — Telephone Encounter (Signed)
Oral Oncology Patient Advocate Encounter  Application for PAP has been placed on hold. Patient must use foundation assistance first before re-enrolling with [employer].  I will re-open the case if needed at a later time.   I have left a voicemail for the patient.  [name], CPhT-Adv Oncology Pharmacy Patient [place] Direct Number: [phone]  Fax: [phone]

## 2022-09-08 ENCOUNTER — Telehealth: Payer: Self-pay

## 2022-09-08 NOTE — Telephone Encounter (Signed)
        Patient  visited [place] on 1/5   Telephone encounter attempt :  2nd  A HIPAA compliant voice message was left requesting a return call.  Instructed patient to call back.    [place], Care Management  [phone] [address], [place], [place] [address] Phone: [phone] Email: [email or web address]

## 2022-09-12 ENCOUNTER — Other Ambulatory Visit (HOSPITAL_COMMUNITY): Payer: Self-pay

## 2022-09-12 NOTE — Telephone Encounter (Signed)
Oral Oncology Patient Advocate Encounter   Requested POI and proof of Medicare D sent to [facility] via e-fax [phone].  This grant is now complete.  Jinger Neighbors, CPhT-Adv Oncology Pharmacy Patient Advocate [facility] Direct Number: [phone]  Fax: [phone]

## 2022-09-13 ENCOUNTER — Ambulatory Visit: Payer: Medicare Other | Attending: Cardiology

## 2022-09-13 DIAGNOSIS — I480 Paroxysmal atrial fibrillation: Secondary | ICD-10-CM

## 2022-09-13 DIAGNOSIS — Z5181 Encounter for therapeutic drug level monitoring: Secondary | ICD-10-CM

## 2022-09-13 LAB — POCT INR: INR: 7.1 — AB (ref 2.0–3.0)

## 2022-09-13 NOTE — Patient Instructions (Signed)
Description   Eat greens. HOLD today's dose, tomorrow's dose and Thursdays dose. 1/19 Friday take 0.5 tablet 1/20 Saturday take 1 tablet  1/21 Sunday 0.5 tablet  1/22 Monday 0.5 tablet  Repeat INR in 1 week.  [facility] [phone] Normal dose: 1 tablet daily except 0.5 tablet on Mondays, Wednesdays and Fridays.

## 2022-09-16 ENCOUNTER — Telehealth: Payer: Self-pay | Admitting: Internal Medicine

## 2022-09-16 NOTE — Telephone Encounter (Signed)
Called patient regarding upcoming February appointments, left a voicemail.

## 2022-09-19 ENCOUNTER — Other Ambulatory Visit (HOSPITAL_COMMUNITY): Payer: Self-pay

## 2022-09-20 ENCOUNTER — Ambulatory Visit: Payer: Medicare Other | Attending: Cardiology

## 2022-09-20 DIAGNOSIS — Z5181 Encounter for therapeutic drug level monitoring: Secondary | ICD-10-CM

## 2022-09-20 DIAGNOSIS — I480 Paroxysmal atrial fibrillation: Secondary | ICD-10-CM

## 2022-09-20 LAB — POCT INR: INR: 2.6 (ref 2.0–3.0)

## 2022-09-20 NOTE — Patient Instructions (Addendum)
Description   While on Prednisone, START taking 1 tablet daily EXCEPT 0.5 tablet on Mondays, Wednesdays, Fridays, and Saturdays. Eat greens once per week   Repeat INR in 1 week.  [facility] [phone] Normal dose: 1 tablet daily except 0.5 tablet on Mondays, Wednesdays and Fridays.

## 2022-09-27 ENCOUNTER — Ambulatory Visit: Payer: Medicare Other | Attending: Internal Medicine

## 2022-09-27 DIAGNOSIS — Z5181 Encounter for therapeutic drug level monitoring: Secondary | ICD-10-CM | POA: Diagnosis not present

## 2022-09-27 DIAGNOSIS — I480 Paroxysmal atrial fibrillation: Secondary | ICD-10-CM

## 2022-09-27 LAB — POCT INR: INR: 4 — AB (ref 2.0–3.0)

## 2022-09-27 NOTE — Patient Instructions (Signed)
Description   Hold today's dose. While on Prednisone, START taking 0.5 tablet daily EXCEPT 1 tablet on Tuesdays and Thursdays.  Eat greens once per week   Repeat INR in 1 week.  [facility] [phone] Normal dose: 1 tablet daily except 0.5 tablet on Mondays, Wednesdays and Fridays.

## 2022-09-28 ENCOUNTER — Inpatient Hospital Stay: Payer: Medicare Other

## 2022-09-30 ENCOUNTER — Inpatient Hospital Stay: Payer: Medicare Other | Attending: Oncology

## 2022-09-30 ENCOUNTER — Telehealth: Payer: Self-pay

## 2022-09-30 ENCOUNTER — Ambulatory Visit (HOSPITAL_COMMUNITY)
Admission: RE | Admit: 2022-09-30 | Discharge: 2022-09-30 | Disposition: A | Discharge status: Home / Self Care | Payer: Medicare Other | Source: Ambulatory Visit | Attending: Oncology | Admitting: Oncology

## 2022-09-30 ENCOUNTER — Other Ambulatory Visit: Payer: Self-pay

## 2022-09-30 ENCOUNTER — Inpatient Hospital Stay: Payer: Medicare Other

## 2022-09-30 DIAGNOSIS — C641 Malignant neoplasm of right kidney, except renal pelvis: Secondary | ICD-10-CM

## 2022-09-30 DIAGNOSIS — N186 End stage renal disease: Secondary | ICD-10-CM | POA: Insufficient documentation

## 2022-09-30 DIAGNOSIS — K219 Gastro-esophageal reflux disease without esophagitis: Secondary | ICD-10-CM | POA: Insufficient documentation

## 2022-09-30 DIAGNOSIS — Z7902 Long term (current) use of antithrombotics/antiplatelets: Secondary | ICD-10-CM | POA: Insufficient documentation

## 2022-09-30 DIAGNOSIS — Z7901 Long term (current) use of anticoagulants: Secondary | ICD-10-CM | POA: Insufficient documentation

## 2022-09-30 DIAGNOSIS — Z79899 Other long term (current) drug therapy: Secondary | ICD-10-CM | POA: Insufficient documentation

## 2022-09-30 DIAGNOSIS — E032 Hypothyroidism due to medicaments and other exogenous substances: Secondary | ICD-10-CM

## 2022-09-30 DIAGNOSIS — E1122 Type 2 diabetes mellitus with diabetic chronic kidney disease: Secondary | ICD-10-CM | POA: Insufficient documentation

## 2022-09-30 DIAGNOSIS — R000 Tachycardia, unspecified: Secondary | ICD-10-CM | POA: Insufficient documentation

## 2022-09-30 DIAGNOSIS — Z87442 Personal history of urinary calculi: Secondary | ICD-10-CM | POA: Insufficient documentation

## 2022-09-30 DIAGNOSIS — Z992 Dependence on renal dialysis: Secondary | ICD-10-CM | POA: Insufficient documentation

## 2022-09-30 DIAGNOSIS — Z7952 Long term (current) use of systemic steroids: Secondary | ICD-10-CM | POA: Insufficient documentation

## 2022-09-30 DIAGNOSIS — I700 Atherosclerosis of aorta: Secondary | ICD-10-CM | POA: Insufficient documentation

## 2022-09-30 DIAGNOSIS — I4891 Unspecified atrial fibrillation: Secondary | ICD-10-CM | POA: Insufficient documentation

## 2022-09-30 DIAGNOSIS — K5730 Diverticulosis of large intestine without perforation or abscess without bleeding: Secondary | ICD-10-CM | POA: Insufficient documentation

## 2022-09-30 DIAGNOSIS — Z86711 Personal history of pulmonary embolism: Secondary | ICD-10-CM | POA: Insufficient documentation

## 2022-09-30 DIAGNOSIS — I2510 Atherosclerotic heart disease of native coronary artery without angina pectoris: Secondary | ICD-10-CM | POA: Insufficient documentation

## 2022-09-30 DIAGNOSIS — E1151 Type 2 diabetes mellitus with diabetic peripheral angiopathy without gangrene: Secondary | ICD-10-CM | POA: Insufficient documentation

## 2022-09-30 DIAGNOSIS — K760 Fatty (change of) liver, not elsewhere classified: Secondary | ICD-10-CM | POA: Insufficient documentation

## 2022-09-30 DIAGNOSIS — C642 Malignant neoplasm of left kidney, except renal pelvis: Secondary | ICD-10-CM | POA: Insufficient documentation

## 2022-09-30 DIAGNOSIS — C7800 Secondary malignant neoplasm of unspecified lung: Secondary | ICD-10-CM | POA: Insufficient documentation

## 2022-09-30 DIAGNOSIS — R0682 Tachypnea, not elsewhere classified: Secondary | ICD-10-CM | POA: Insufficient documentation

## 2022-09-30 LAB — CBC WITH DIFFERENTIAL (CANCER CENTER ONLY)
Abs Immature Granulocytes: 0.58 10*3/uL — ABNORMAL HIGH (ref 0.00–0.07)
Basophils Absolute: 0 10*3/uL (ref 0.0–0.1)
Basophils Relative: 0 %
Eosinophils Absolute: 0.1 10*3/uL (ref 0.0–0.5)
Eosinophils Relative: 1 %
HCT: 38 % — ABNORMAL LOW (ref 39.0–52.0)
Hemoglobin: 12.7 g/dL — ABNORMAL LOW (ref 13.0–17.0)
Immature Granulocytes: 5 %
Lymphocytes Relative: 15 %
Lymphs Abs: 1.8 10*3/uL (ref 0.7–4.0)
MCH: 33 pg (ref 26.0–34.0)
MCHC: 33.4 g/dL (ref 30.0–36.0)
MCV: 98.7 fL (ref 80.0–100.0)
Monocytes Absolute: 1.1 10*3/uL — ABNORMAL HIGH (ref 0.1–1.0)
Monocytes Relative: 9 %
Neutro Abs: 8.7 10*3/uL — ABNORMAL HIGH (ref 1.7–7.7)
Neutrophils Relative %: 70 %
Platelet Count: 203 10*3/uL (ref 150–400)
RBC: 3.85 MIL/uL — ABNORMAL LOW (ref 4.22–5.81)
RDW: 16.7 % — ABNORMAL HIGH (ref 11.5–15.5)
WBC Count: 12.3 10*3/uL — ABNORMAL HIGH (ref 4.0–10.5)
nRBC: 0 % (ref 0.0–0.2)

## 2022-09-30 LAB — CMP (CANCER CENTER ONLY)
ALT: 12 U/L (ref 0–44)
AST: 14 U/L — ABNORMAL LOW (ref 15–41)
Albumin: 3.9 g/dL (ref 3.5–5.0)
Alkaline Phosphatase: 56 U/L (ref 38–126)
Anion gap: 15 (ref 5–15)
BUN: 53 mg/dL — ABNORMAL HIGH (ref 8–23)
CO2: 27 mmol/L (ref 22–32)
Calcium: 9.6 mg/dL (ref 8.9–10.3)
Chloride: 97 mmol/L — ABNORMAL LOW (ref 98–111)
Creatinine: 6.37 mg/dL (ref 0.61–1.24)
GFR, Estimated: 9 mL/min — ABNORMAL LOW (ref >60)
Glucose, Bld: 85 mg/dL (ref 70–99)
Potassium: 3.8 mmol/L (ref 3.5–5.1)
Sodium: 139 mmol/L (ref 135–145)
Total Bilirubin: 0.4 mg/dL (ref 0.3–1.2)
Total Protein: 6.9 g/dL (ref 6.5–8.1)

## 2022-09-30 LAB — TSH: TSH: 1.522 u[IU]/mL (ref 0.350–4.500)

## 2022-09-30 NOTE — Telephone Encounter (Signed)
CRITICAL VALUE STICKER  CRITICAL VALUE: CREATININE 6.37  RECEIVER (on-site recipient of call): Maya Scholer P. LPN  DATE & TIME NOTIFIED: 09/30/22 1024AM  MESSENGER (representative from lab): Laqueta Due   MD NOTIFIED: Cassie Heilingoetter, PA-C  TIME OF NOTIFICATION: 1028 am   RESPONSE: No Recommendations at this time.

## 2022-10-03 ENCOUNTER — Inpatient Hospital Stay (HOSPITAL_BASED_OUTPATIENT_CLINIC_OR_DEPARTMENT_OTHER): Payer: Medicare Other | Admitting: Internal Medicine

## 2022-10-03 ENCOUNTER — Other Ambulatory Visit: Payer: Self-pay

## 2022-10-03 VITALS — BP 144/80 | HR 124 | Temp 97.7°F | Resp 30 | Wt 268.9 lb

## 2022-10-03 DIAGNOSIS — R0682 Tachypnea, not elsewhere classified: Secondary | ICD-10-CM | POA: Diagnosis not present

## 2022-10-03 DIAGNOSIS — E1122 Type 2 diabetes mellitus with diabetic chronic kidney disease: Secondary | ICD-10-CM | POA: Diagnosis not present

## 2022-10-03 DIAGNOSIS — C642 Malignant neoplasm of left kidney, except renal pelvis: Secondary | ICD-10-CM | POA: Diagnosis present

## 2022-10-03 DIAGNOSIS — Z7952 Long term (current) use of systemic steroids: Secondary | ICD-10-CM | POA: Diagnosis not present

## 2022-10-03 DIAGNOSIS — Z7902 Long term (current) use of antithrombotics/antiplatelets: Secondary | ICD-10-CM | POA: Diagnosis not present

## 2022-10-03 DIAGNOSIS — Z79899 Other long term (current) drug therapy: Secondary | ICD-10-CM | POA: Diagnosis not present

## 2022-10-03 DIAGNOSIS — I4891 Unspecified atrial fibrillation: Secondary | ICD-10-CM | POA: Diagnosis not present

## 2022-10-03 DIAGNOSIS — K5730 Diverticulosis of large intestine without perforation or abscess without bleeding: Secondary | ICD-10-CM | POA: Diagnosis not present

## 2022-10-03 DIAGNOSIS — R000 Tachycardia, unspecified: Secondary | ICD-10-CM | POA: Diagnosis not present

## 2022-10-03 DIAGNOSIS — I700 Atherosclerosis of aorta: Secondary | ICD-10-CM | POA: Diagnosis not present

## 2022-10-03 DIAGNOSIS — N186 End stage renal disease: Secondary | ICD-10-CM | POA: Diagnosis not present

## 2022-10-03 DIAGNOSIS — C641 Malignant neoplasm of right kidney, except renal pelvis: Secondary | ICD-10-CM | POA: Diagnosis not present

## 2022-10-03 DIAGNOSIS — K219 Gastro-esophageal reflux disease without esophagitis: Secondary | ICD-10-CM | POA: Diagnosis not present

## 2022-10-03 DIAGNOSIS — E1151 Type 2 diabetes mellitus with diabetic peripheral angiopathy without gangrene: Secondary | ICD-10-CM | POA: Diagnosis not present

## 2022-10-03 DIAGNOSIS — Z86711 Personal history of pulmonary embolism: Secondary | ICD-10-CM | POA: Diagnosis not present

## 2022-10-03 DIAGNOSIS — Z992 Dependence on renal dialysis: Secondary | ICD-10-CM | POA: Diagnosis not present

## 2022-10-03 DIAGNOSIS — Z87442 Personal history of urinary calculi: Secondary | ICD-10-CM | POA: Diagnosis not present

## 2022-10-03 DIAGNOSIS — C7800 Secondary malignant neoplasm of unspecified lung: Secondary | ICD-10-CM | POA: Diagnosis not present

## 2022-10-03 DIAGNOSIS — I2510 Atherosclerotic heart disease of native coronary artery without angina pectoris: Secondary | ICD-10-CM | POA: Diagnosis not present

## 2022-10-03 DIAGNOSIS — K760 Fatty (change of) liver, not elsewhere classified: Secondary | ICD-10-CM | POA: Diagnosis not present

## 2022-10-03 DIAGNOSIS — Z7901 Long term (current) use of anticoagulants: Secondary | ICD-10-CM | POA: Diagnosis not present

## 2022-10-03 NOTE — Progress Notes (Signed)
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  PRIOR THERAPY:1) status post right radical nephrectomy with the final pathology consistent with renal cell carcinoma, clear-cell type nuclear grade 4 measuring 10.2 cm with the tumor invades the inferior pelvis, perirenal and renal sinus fat under the care of Dr. Gloriann Loan. 2) status post robotic assisted left upper lobe wedge resection under the care of Dr. Roxan Hockey on October 16, 2020 and the final pathology showed clear-cell renal cell carcinoma with negative margins. 3) status post SBRT to left upper lobe and right lower lobe pulmonary nodules in October 2022. 4) status post treatment with immunotherapy with Keytruda 400 Mg IV every 6 weeks started Jan 13, 2021 and axitinib 5 mg p.o. twice daily was added in September 2023.  Pembrolizumab was discontinued in November 2023 secondary to disease progression and requirement for high-dose steroids.  CURRENT THERAPY: Single agent axitinib 5 mg p.o. daily started November 2023.  INTERVAL HISTORY: George Lewis. 67 y.o. male came to the clinic today accompanied by his wife Vaughan Basta to establish care with me after his primary oncologist Dr. Alen Blew left the practice.  The patient is fine today except for the baseline shortness of breath.  He has been on amiodarone for more than 2 years and this was discontinued recently by his cardiologist.  The patient is currently on hemodialysis for end-stage renal disease.  He was diagnosed with renal cell carcinoma in September 2019 with evidence of pulmonary metastasis in January 2022.  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  The patient denied having any current chest pain or hemoptysis.  He has no nausea, vomiting, diarrhea or constipation.  He has no headache or visual changes.  He has no recent weight loss or night sweats.  He has been tolerating his treatment with axitinib fairly well.  He had repeat CT scan of the chest, abdomen and pelvis performed recently and he is here for evaluation and discussion of his scan results.   MEDICAL HISTORY: Past Medical History:  Diagnosis Date   Anemia    Aortic stenosis 05/2022   Arthritis    knees   Atrial fibrillation ([place])    Bilateral renal cysts 03/23/2018   Noted MRI ABD   CAD (coronary artery disease), native coronary artery 06/03/2022   s/p PCI mLAD   Cancer [facility])    right kidney cancer   Cancer ([place])    pancreatic   Cholelithiasis 03/19/2018   noted on CT AB/Pelvis   Diabetes mellitus without complication ([place])    Diverticulosis of colon 04/19/2018   Noted on CT abd/pelvis   Dysrhythmia    afib   ESRD on hemodialysis [facility])    nephrologist-- dr Hollie Salk [name]);  05-01-2018 has not started dialysis, scheduled for AV fistula creation 05-07-2018   GERD (gastroesophageal reflux disease)    Hepatic steatosis 03/19/2018   Mild diffuse, noted on CT AB/Pelvis   History of kidney stones    History of pulmonary embolus (PE) 03/2008   treated with coumadin for 6 months   History of sepsis 04/20/2018   per discharge note , probable UTI  Hypertension    IBS (irritable bowel syndrome)    Nocturia    OSA on CPAP    uses CPAP nightly   Pancreatic lesion    0.4cm cystic per CT 07/ 2019   Peripheral vascular disease (H[facility])    Pneumonia    x 1   Pulmonary nodule    Solid 12mm Right Lower Lobe   Right renal mass 04/10/2018   new dx--  scheduled for nephrectomy 05-16-2018   S/P ureteral stent placement: 04/16/2018 04/19/2018    ALLERGIES:  is allergic to  penicillins.  MEDICATIONS:  Current Outpatient Medications  Medication Sig Dispense Refill   acetaminophen (TYLENOL) 500 MG tablet Take 1,500 mg by mouth every 8 (eight) hours as needed for moderate pain.     allopurinol (ZYLOPRIM) 100 MG tablet Take 1 tablet (100 mg total) by mouth every evening. 30 tablet 0   atorvastatin (LIPITOR) 40 MG tablet Take 1 tablet (40 mg total) by mouth every evening.     axitinib (INLYTA) 5 MG tablet Take one tablet daily 60 tablet 0   blood glucose meter kit and supplies Dispense based on patient and insurance preference. Use up to four times daily as directed. (FOR ICD-10 E10.9, E11.9). 1 each 0   calcitRIOL (ROCALTROL) 0.5 MCG capsule Take 1 capsule (0.5 mcg total) by mouth Every Tuesday,Thursday,and Saturday with dialysis. (Patient not taking: Reported on 08/24/2022) 30 capsule 0   clopidogrel (PLAVIX) 75 MG tablet Take 1 tablet (75 mg total) by mouth daily with breakfast. 120 tablet 0   diphenhydramine-acetaminophen (TYLENOL PM) 25-500 MG TABS tablet Take 3 tablets by mouth at bedtime as needed (sleep/headache).     ferric citrate (AURYXIA) 1 GM 210 MG(Fe) tablet Take 1 tablet (210 mg total) by mouth daily with supper. (Patient taking differently: Take 210 mg by mouth 3 (three) times daily with meals.) 270 tablet    fluticasone (FLONASE) 50 MCG/ACT nasal spray Place 1 spray into both nostrils daily as needed for allergies or rhinitis.     glucose blood (ACCU-CHEK GUIDE) test strip USE 1 STRIP TO CHECK FASTING BLOOD SUGAR AND 2 HOURS AFTER LARGEST MEAL 100 each 0   insulin glargine (LANTUS) 100 UNIT/ML injection Inject into the skin daily as needed (high blood glucose 180). (Patient not taking: Reported on 08/24/2022)     loperamide (IMODIUM A-D) 2 MG tablet Take 2 mg by mouth 4 (four) times daily as needed for diarrhea or loose stools.     loratadine (CLARITIN) 10 MG tablet Take 10 mg by mouth every evening.      Menthol, Topical Analgesic, (BIOFREEZE EX) Apply  1 Application topically as needed (pain).     metoprolol succinate (TOPROL XL) 25 MG 24 hr tablet Take 1 tablet (25 mg total) by mouth daily. 30 tablet 6   midodrine (PROAMATINE) 10 MG tablet Take 1 tablet (10 mg total) by mouth 3 (three) times daily as needed (for hypotension/dialysis). AS ordered. Take In the evening 10 mg on Mon. Wed and Friday Take 20 mg on Dialysis days Tues, Thurs and Saturday. 10 mg when he gets there and 10 mg half way through. (Patient taking differently: Take 10-20 mg by mouth 3 (three) times daily as needed (for hypotension/dialysis). AS ordered. Take In the evening 10 mg on Mon. Wed and Friday (as needed) Take 10-20 mg on Dialysis days Tues, Thurs and Saturday. 10-20 mg when he gets there and 10-20 mg half way through.) 270 tablet 3   nitroGLYCERIN (NITROSTAT) 0.4  MG SL tablet Place 1 tablet (0.4 mg total) under the tongue every 5 (five) minutes as needed for chest pain. 14 tablet 12   omeprazole (PRILOSEC OTC) 20 MG tablet Take 40 mg by mouth daily before breakfast.      pembrolizumab (KEYTRUDA) 100 MG/4ML SOLN Inject into the vein every 6 (six) weeks. Infused at providers office     predniSONE (DELTASONE) 10 MG tablet Take 3 tablets (30 mg total) by mouth daily with breakfast. 90 tablet 1   prochlorperazine (COMPAZINE) 10 MG tablet Take 1 tablet (10 mg total) by mouth every 6 (six) hours as needed for nausea or vomiting. 60 tablet 1   Sennosides (SENOKOT EXTRA STRENGTH) 17.2 MG TABS Take 51.6 mg by mouth at bedtime as needed (constipation).     traMADol (ULTRAM) 50 MG tablet TAKE 1 TABLET BY MOUTH EVERY 6 HOURS AS NEEDED FOR PAIN 30 tablet 0   warfarin (COUMADIN) 3 MG tablet TAKE 1 TO 2 TABLETS BY MOUTH ONCE DAILY AS DIRECTED BY THE COUMADIN CLINIC 90 tablet 0   No current facility-administered medications for this visit.    SURGICAL HISTORY:  Past Surgical History:  Procedure Laterality Date   AV FISTULA PLACEMENT Left 05/07/2018   Procedure: Creation of Left arm  Radiocephalic Fistula;  Surgeon: Marty Heck, MD;  Location: [place];  Service: Vascular;  Laterality: Left;   AV FISTULA PLACEMENT Left 05/15/2019   Procedure: Creation of Brachiocephalic fistula left arm;  Surgeon: Waynetta Sandy, MD;  Location: [place];  Service: Vascular;  Laterality: Left;   [place] Left 07/15/2019   Procedure: BASILIC VEIN TRANSPOSITION SECOND STAGE;  Surgeon: Waynetta Sandy, MD;  Location: [place];  Service: Vascular;  Laterality: Left;   BRONCHIAL BRUSHINGS  08/25/2020   Procedure: BRONCHIAL BRUSHINGS;  Surgeon: Garner Nash, DO;  Location: [place] [facility];  Service: Pulmonary;;   BRONCHIAL NEEDLE ASPIRATION BIOPSY  08/25/2020   Procedure: BRONCHIAL NEEDLE ASPIRATION BIOPSIES;  Surgeon: Garner Nash, DO;  Location: [place] [facility];  Service: Pulmonary;;   BRONCHIAL WASHINGS  08/25/2020   Procedure: BRONCHIAL WASHINGS;  Surgeon: Garner Nash, DO;  Location: [place] [facility];  Service: Pulmonary;;   COLONOSCOPY     CORONARY PRESSURE WIRE/FFR WITH 3D MAPPING N/A 06/03/2022   Procedure: Coronary Pressure Wire/FFR w/3D Mapping;  Surgeon: Early Osmond, MD;  Location: [place] [facility];  Service: Cardiovascular;  Laterality: N/A;   CORONARY STENT INTERVENTION N/A 06/03/2022   Procedure: CORONARY STENT INTERVENTION;  Surgeon: Early Osmond, MD;  Location: [place] [facility];  Service: Cardiovascular;  Laterality: N/A;   CYSTOSCOPY/RETROGRADE/URETEROSCOPY/STONE EXTRACTION WITH BASKET  x2 last one 1990s approx.   CYSTOSCOPY/URETEROSCOPY/HOLMIUM LASER/STENT PLACEMENT Left 04/16/2018   Procedure: CYSTOSCOPY/LEFT URETEROSCOPY/LEFT RETROGRADE/STENT PLACEMENT;  Surgeon: Lucas Mallow, MD;  Location: [facility];  Service: Urology;  Laterality: Left;   EUS N/A 05/03/2018   Procedure: UPPER ENDOSCOPIC ULTRASOUND (EUS) RADIAL;  Surgeon: Milus Banister, MD;  [facility];  Service: Gastroenterology;  Laterality: N/A;   EUS N/A  05/03/2018   Procedure: UPPER ENDOSCOPIC ULTRASOUND (EUS) LINEAR;  Surgeon: Milus Banister, MD;  Location[facility];  Service: Gastroenterology;  Laterality: N/A;   EUS  10/05/2020   upper GI   EXTRACORPOREAL SHOCK WAVE LITHOTRIPSY  x3  last one 2004 approx.   FINE NEEDLE ASPIRATION N/A 05/03/2018   Procedure: FINE NEEDLE ASPIRATION (FNA) LINEAR;  Surgeon: Milus Banister, MD;  Location[facility];  Service: Gastroenterology;  Laterality: N/A;   INTERCOSTAL NERVE BLOCK Left 10/16/2020   Procedure: INTERCOSTAL NERVE BLOCK;  Surgeon: Melrose Nakayama, MD;  Location: [place];  Service: Thoracic;  Laterality: Left;   LAPAROSCOPIC NEPHRECTOMY, HAND ASSISTED Right 05/16/2018   Procedure: HAND ASSISTED LAPAROSCOPIC RIGHT NEPHRECTOMY;  Surgeon: Lucas Mallow, MD;  Location: [facility];  Service: Urology;  Laterality: Right;   left index finger attachment  1980s   3-4 surgeries   NODE DISSECTION Left 10/16/2020   Procedure: NODE DISSECTION;  Surgeon: Melrose Nakayama, MD;  Location: [place];  Service: Thoracic;  Laterality: Left;   RIGHT HEART CATH N/A 08/05/2022   Procedure: RIGHT HEART CATH;  Surgeon: Jolaine Artist, MD;  Location: [place] [facility];  Service: Cardiovascular;  Laterality: N/A;   RIGHT/LEFT HEART CATH AND CORONARY ANGIOGRAPHY N/A 06/03/2022   Procedure: RIGHT/LEFT HEART CATH AND CORONARY ANGIOGRAPHY;  Surgeon: Early Osmond, MD;  Location: [place] [facility];  Service: Cardiovascular;  Laterality: N/A;   TOE SURGERY  1980s   in beween 2nd and 3rd toes cyst removed   UPPER GI ENDOSCOPY     VIDEO BRONCHOSCOPY WITH ENDOBRONCHIAL NAVIGATION Bilateral 08/25/2020   Procedure: VIDEO BRONCHOSCOPY WITH ENDOBRONCHIAL NAVIGATION;  Surgeon: Garner Nash, DO;  Location: [place];  Service: Pulmonary;  Laterality: Bilateral;    REVIEW OF SYSTEMS:  Constitutional: positive for fatigue Eyes: negative Ears, nose, mouth, throat, and face: negative Respiratory: positive  for dyspnea on exertion Cardiovascular: positive for tachycardia Gastrointestinal: negative Genitourinary:negative Integument/breast: negative Hematologic/lymphatic: negative Musculoskeletal:negative Neurological: negative Behavioral/Psych: negative Endocrine: negative Allergic/Immunologic: negative   PHYSICAL EXAMINATION: General appearance: alert, cooperative, fatigued, and no distress Head: Normocephalic, without obvious abnormality, atraumatic Neck: no adenopathy, no JVD, supple, symmetrical, trachea midline, and thyroid not enlarged, symmetric, no tenderness/mass/nodules Lymph nodes: Cervical, supraclavicular, and axillary nodes normal. Resp: clear to auscultation bilaterally Back: symmetric, no curvature. ROM normal. No CVA tenderness. Cardio: regular rate and rhythm, S1, S2 normal, no murmur, click, rub or gallop GI: soft, non-tender; bowel sounds normal; no masses,  no organomegaly Extremities: extremities normal, atraumatic, no cyanosis or edema Neurologic: Alert and oriented X 3, normal strength and tone. Normal symmetric reflexes. Normal coordination and gait  ECOG PERFORMANCE STATUS: 1 - Symptomatic but completely ambulatory  Blood pressure (!) 144/80, pulse (!) 130, temperature 97.7 F (36.5 C), temperature source Oral, resp. rate (!) 40, weight 268 lb 14.4 oz (122 kg), SpO2 100 %.  LABORATORY DATA: Lab Results  Component Value Date   WBC 12.3 (H) 09/30/2022   HGB 12.7 (L) 09/30/2022   HCT 38.0 (L) 09/30/2022   MCV 98.7 09/30/2022   PLT 203 09/30/2022      Chemistry      Component Value Date/Time   NA 139 09/30/2022 0940   NA 139 04/02/2021 0908   K 3.8 09/30/2022 0940   CL 97 (L) 09/30/2022 0940   CO2 27 09/30/2022 0940   BUN 53 (H) 09/30/2022 0940   BUN 29 (H) 04/02/2021 0908   CREATININE 6.37 (HH) 09/30/2022 0940   GLU 96 03/14/2019 0000      Component Value Date/Time   CALCIUM 9.6 09/30/2022 0940   ALKPHOS 56 09/30/2022 0940   AST 14 (L)  09/30/2022 0940   ALT 12 09/30/2022 0940   BILITOT 0.4 09/30/2022 0940       RADIOGRAPHIC STUDIES: CT Chest Wo Contrast  Result Date: 09/30/2022 CLINICAL DATA:  History of recurrent metastatic renal cell carcinoma. Follow-up. * Tracking Code: BO *  EXAM: CT CHEST, ABDOMEN AND PELVIS WITHOUT CONTRAST TECHNIQUE: Multidetector CT imaging of the chest, abdomen and pelvis was performed following the standard protocol without IV contrast. RADIATION DOSE REDUCTION: This exam was performed according to the departmental dose-optimization program which includes automated exposure control, adjustment of the mA and/or kV according to patient size and/or use of iterative reconstruction technique. COMPARISON:  Multiple priors including CT July 20, 2022 and April 20, 2022. FINDINGS: CT CHEST FINDINGS Cardiovascular: Aortic atherosclerosis. Coronary artery calcifications. Normal size heart Mediastinum/Nodes: Decreased size of the posterior mediastinal lymph node/soft tissue density now measuring 18 x 16 mm on image 41/2 previously 2.4 x 2.3 cm when remeasured for consistency. No new pathologically enlarged mediastinal, hilar or axillary lymph nodes, noting limited sensitivity for the detection of hilar adenopathy on this noncontrast study. Esophagus is grossly unremarkable. Lungs/Pleura: Increased consolidation in the right lung base with air bronchograms and associated volume loss which obscures the previously indexed right lower lobe pulmonary nodules. Similar postsurgical change in the peripheral right upper lobe for instance on image 72/8. Stable size of the small left partially loculated pleural effusion. Increased left lower lobe volume loss with mild bronchial wall thickening. Visible left-sided pulmonary nodules have decreased in size. For instance a left upper lobe pulmonary nodule now measures 8 mm on image 59/8 previously 10 mm and a left lower lobe pulmonary nodule now measures 3 mm on image 98/8 previously  5 mm. Loculated pleural fluid along the medial portion of the right major fissure on image 51/8. No new suspicious pulmonary nodules or masses. Musculoskeletal: See below for full musculoskeletal details. CT ABDOMEN PELVIS FINDINGS Hepatobiliary: No suspicious hepatic lesion on this noncontrast enhanced examination. Subtle nodularity along the capsule of the posterior right lobe measuring 9 mm on image 52/2 and a second measuring 5 mm on image 54/2 are stable over multiple prior examinations. Gallbladder is unremarkable. No biliary ductal dilation. Pancreas: No pancreatic ductal dilation or evidence of acute inflammation. Spleen: No splenomegaly. Adrenals/Urinary Tract: Bilateral adrenal glands appear normal. Right kidney surgically absent without new suspicious nodularity in the nephrectomy bed. No left-sided hydronephrosis. Nonobstructive punctate renal stones. Fluid density 12 mm left renal lesion is compatible with a cyst and considered benign requiring no independent imaging follow-up. Urinary bladder is unremarkable for degree of distension. Stomach/Bowel: Stomach is unremarkable for degree of distension. No pathologic dilation of small or large bowel. Normal appendix. Colonic diverticulosis without findings of acute diverticulitis. Vascular/Lymphatic: Aortic atherosclerosis. Smooth IVC contours. No pathologically enlarged abdominal or pelvic lymph nodes. Reproductive: Prostate is unremarkable. Other: Bilateral fat containing inguinal hernias and small fat containing umbilical hernia. Calcified anterior omental nodule on image 93/2 is stable from prior and reflects sequela of prior inflammation. Musculoskeletal: No aggressive lytic or blastic lesion of bone. IMPRESSION: 1. Increase in the consolidation in the right lung base with air bronchograms and associated volume loss which obscures the previously indexed right lower lobe pulmonary nodules. Stable small left pleural effusion with adjacent pleural  thickening but increased left lower lobe volume loss. 2. Visible left-sided pulmonary nodules have decreased in size. No new suspicious pulmonary nodules or masses. 3. Decreased size of the posterior mediastinal lymph node/soft tissue density. 4. No evidence of new or progressive disease in the abdomen or pelvis. 5. Stable subtle nodularity along the capsule of the posterior right lobe of the liver. 6. Status post right nephrectomy without new suspicious nodularity in the nephrectomy bed. 7. Nonobstructive punctate renal stones. 8. Colonic diverticulosis without findings of  acute diverticulitis. 9. Bilateral fat containing inguinal hernias and small fat containing umbilical hernia. 10.  Aortic Atherosclerosis (ICD10-I70.0). Electronically Signed   By: Dahlia Bailiff M.D.   On: 09/30/2022 15:04   CT Abdomen Pelvis Wo Contrast  Result Date: 09/30/2022 CLINICAL DATA:  History of recurrent metastatic renal cell carcinoma. Follow-up. * Tracking Code: BO * EXAM: CT CHEST, ABDOMEN AND PELVIS WITHOUT CONTRAST TECHNIQUE: Multidetector CT imaging of the chest, abdomen and pelvis was performed following the standard protocol without IV contrast. RADIATION DOSE REDUCTION: This exam was performed according to the departmental dose-optimization program which includes automated exposure control, adjustment of the mA and/or kV according to patient size and/or use of iterative reconstruction technique. COMPARISON:  Multiple priors including CT July 20, 2022 and April 20, 2022. FINDINGS: CT CHEST FINDINGS Cardiovascular: Aortic atherosclerosis. Coronary artery calcifications. Normal size heart Mediastinum/Nodes: Decreased size of the posterior mediastinal lymph node/soft tissue density now measuring 18 x 16 mm on image 41/2 previously 2.4 x 2.3 cm when remeasured for consistency. No new pathologically enlarged mediastinal, hilar or axillary lymph nodes, noting limited sensitivity for the detection of hilar adenopathy on this  noncontrast study. Esophagus is grossly unremarkable. Lungs/Pleura: Increased consolidation in the right lung base with air bronchograms and associated volume loss which obscures the previously indexed right lower lobe pulmonary nodules. Similar postsurgical change in the peripheral right upper lobe for instance on image 72/8. Stable size of the small left partially loculated pleural effusion. Increased left lower lobe volume loss with mild bronchial wall thickening. Visible left-sided pulmonary nodules have decreased in size. For instance a left upper lobe pulmonary nodule now measures 8 mm on image 59/8 previously 10 mm and a left lower lobe pulmonary nodule now measures 3 mm on image 98/8 previously 5 mm. Loculated pleural fluid along the medial portion of the right major fissure on image 51/8. No new suspicious pulmonary nodules or masses. Musculoskeletal: See below for full musculoskeletal details. CT ABDOMEN PELVIS FINDINGS Hepatobiliary: No suspicious hepatic lesion on this noncontrast enhanced examination. Subtle nodularity along the capsule of the posterior right lobe measuring 9 mm on image 52/2 and a second measuring 5 mm on image 54/2 are stable over multiple prior examinations. Gallbladder is unremarkable. No biliary ductal dilation. Pancreas: No pancreatic ductal dilation or evidence of acute inflammation. Spleen: No splenomegaly. Adrenals/Urinary Tract: Bilateral adrenal glands appear normal. Right kidney surgically absent without new suspicious nodularity in the nephrectomy bed. No left-sided hydronephrosis. Nonobstructive punctate renal stones. Fluid density 12 mm left renal lesion is compatible with a cyst and considered benign requiring no independent imaging follow-up. Urinary bladder is unremarkable for degree of distension. Stomach/Bowel: Stomach is unremarkable for degree of distension. No pathologic dilation of small or large bowel. Normal appendix. Colonic diverticulosis without findings of  acute diverticulitis. Vascular/Lymphatic: Aortic atherosclerosis. Smooth IVC contours. No pathologically enlarged abdominal or pelvic lymph nodes. Reproductive: Prostate is unremarkable. Other: Bilateral fat containing inguinal hernias and small fat containing umbilical hernia. Calcified anterior omental nodule on image 93/2 is stable from prior and reflects sequela of prior inflammation. Musculoskeletal: No aggressive lytic or blastic lesion of bone. IMPRESSION: 1. Increase in the consolidation in the right lung base with air bronchograms and associated volume loss which obscures the previously indexed right lower lobe pulmonary nodules. Stable small left pleural effusion with adjacent pleural thickening but increased left lower lobe volume loss. 2. Visible left-sided pulmonary nodules have decreased in size. No new suspicious pulmonary nodules or masses. 3. Decreased size  of the posterior mediastinal lymph node/soft tissue density. 4. No evidence of new or progressive disease in the abdomen or pelvis. 5. Stable subtle nodularity along the capsule of the posterior right lobe of the liver. 6. Status post right nephrectomy without new suspicious nodularity in the nephrectomy bed. 7. Nonobstructive punctate renal stones. 8. Colonic diverticulosis without findings of acute diverticulitis. 9. Bilateral fat containing inguinal hernias and small fat containing umbilical hernia. 10.  Aortic Atherosclerosis (ICD10-I70.0). Electronically Signed   By: Dahlia Bailiff M.D.   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 He is status post right radical nephrectomy with the final pathology consistent with renal cell carcinoma, clear-cell type nuclear grade 4  measuring 10.2 cm with the tumor invades the inferior pelvis, perirenal and renal sinus fat under the care of Dr. Gloriann Loan.  The patient was found to have evidence for disease recurrence in January 2022 and he is status post robotic assisted left upper lobe wedge resection under the care of Dr. Roxan Hockey on October 16, 2020 and the final pathology showed clear-cell renal cell carcinoma with negative margins.  He also underwent SBRT to left upper lobe and right lower lobe pulmonary nodules in October 2022.  He started treatment with immunotherapy with Keytruda 400 Mg IV every 6 weeks started Jan 13, 2021 and axitinib 5 mg p.o. twice daily was added in September 2023.  Pembrolizumab was discontinued in November 2023 secondary to disease progression and requirement for high-dose steroids. The patient is currently on treatment with axitinib 5 mg p.o. daily and has been tolerating this treatment fairly well. He had repeat CT scan of the chest, abdomen and pelvis performed recently.  I personally and independently reviewed the scan images and discussed the result with the patient and his wife Vaughan Basta. His scan showed no concerning findings for disease progression but there was slight increase in the consolidation in the right lung base with stable small left pleural effusion and no evidence for disease progression in the abdomen or pelvis. I recommended for the patient to continue his current treatment with axitinib 5 mg p.o. daily. I will see him back for follow-up visit in 1 months for evaluation and repeat blood work. For the tachycardia and tachypnea, we will repeat EKG today to make sure the patient has no concerning underlying cardiac abnormalities.  He has an appointment with Dr. Haroldine Laws next week. He was advised to call immediately if he has any other concerning symptoms in the interval. The patient voices understanding of current disease status and treatment options and is in agreement with the current  care plan.  All questions were answered. The patient knows to call the clinic with any problems, questions or concerns. We can certainly see the patient much sooner if necessary.  The total time spent in the appointment was 55 minutes.  Disclaimer: This note was dictated with voice recognition software. Similar sounding words can inadvertently be transcribed and may not be corrected upon review.

## 2022-10-04 ENCOUNTER — Ambulatory Visit: Payer: Medicare Other | Attending: Internal Medicine

## 2022-10-04 DIAGNOSIS — Z5181 Encounter for therapeutic drug level monitoring: Secondary | ICD-10-CM

## 2022-10-04 DIAGNOSIS — I480 Paroxysmal atrial fibrillation: Secondary | ICD-10-CM | POA: Diagnosis present

## 2022-10-04 LAB — POCT INR: INR: 3.6 — AB (ref 2.0–3.0)

## 2022-10-04 NOTE — Patient Instructions (Signed)
Description   While on Prednisone, HOLD today's dose and then continue taking 0.5 tablet daily EXCEPT 1 tablet on Tuesdays and Thursdays.  Eat greens once per week   Stay consistent with greens (2 per week)  Repeat INR in 2 weeks.  [facility] [phone] Normal dose: 1 tablet daily except 0.5 tablet on Mondays, Wednesdays and Fridays.

## 2022-10-05 ENCOUNTER — Ambulatory Visit: Payer: Medicare Other | Admitting: Internal Medicine

## 2022-10-06 ENCOUNTER — Ambulatory Visit: Payer: Medicare Other | Admitting: Internal Medicine

## 2022-10-10 ENCOUNTER — Emergency Department (HOSPITAL_COMMUNITY): Payer: Medicare Other

## 2022-10-10 ENCOUNTER — Other Ambulatory Visit: Payer: Self-pay

## 2022-10-10 ENCOUNTER — Encounter (HOSPITAL_COMMUNITY): Payer: Self-pay | Admitting: *Deleted

## 2022-10-10 ENCOUNTER — Encounter (HOSPITAL_COMMUNITY): Payer: Self-pay | Admitting: Internal Medicine

## 2022-10-10 ENCOUNTER — Other Ambulatory Visit (HOSPITAL_COMMUNITY): Payer: Medicare Other

## 2022-10-10 ENCOUNTER — Telehealth (HOSPITAL_COMMUNITY): Payer: Self-pay

## 2022-10-10 ENCOUNTER — Emergency Department (HOSPITAL_COMMUNITY)
Admission: EM | Admit: 2022-10-10 | Discharge: 2022-10-10 | Disposition: A | Discharge status: Home / Self Care | Payer: Medicare Other | Attending: Student | Admitting: Student

## 2022-10-10 ENCOUNTER — Ambulatory Visit (HOSPITAL_BASED_OUTPATIENT_CLINIC_OR_DEPARTMENT_OTHER)
Admission: RE | Admit: 2022-10-10 | Discharge: 2022-10-10 | Disposition: A | Discharge status: Home / Self Care | Payer: Medicare Other | Source: Ambulatory Visit | Attending: Internal Medicine | Admitting: Internal Medicine

## 2022-10-10 VITALS — BP 90/50 | HR 142

## 2022-10-10 DIAGNOSIS — I480 Paroxysmal atrial fibrillation: Secondary | ICD-10-CM | POA: Insufficient documentation

## 2022-10-10 DIAGNOSIS — R531 Weakness: Secondary | ICD-10-CM | POA: Insufficient documentation

## 2022-10-10 DIAGNOSIS — D72829 Elevated white blood cell count, unspecified: Secondary | ICD-10-CM | POA: Insufficient documentation

## 2022-10-10 DIAGNOSIS — Z992 Dependence on renal dialysis: Secondary | ICD-10-CM | POA: Insufficient documentation

## 2022-10-10 DIAGNOSIS — Z85528 Personal history of other malignant neoplasm of kidney: Secondary | ICD-10-CM | POA: Diagnosis not present

## 2022-10-10 DIAGNOSIS — C7800 Secondary malignant neoplasm of unspecified lung: Secondary | ICD-10-CM | POA: Insufficient documentation

## 2022-10-10 DIAGNOSIS — R0602 Shortness of breath: Secondary | ICD-10-CM | POA: Insufficient documentation

## 2022-10-10 DIAGNOSIS — Z905 Acquired absence of kidney: Secondary | ICD-10-CM | POA: Insufficient documentation

## 2022-10-10 DIAGNOSIS — I2510 Atherosclerotic heart disease of native coronary artery without angina pectoris: Secondary | ICD-10-CM | POA: Insufficient documentation

## 2022-10-10 DIAGNOSIS — I132 Hypertensive heart and chronic kidney disease with heart failure and with stage 5 chronic kidney disease, or end stage renal disease: Secondary | ICD-10-CM | POA: Insufficient documentation

## 2022-10-10 DIAGNOSIS — Z7982 Long term (current) use of aspirin: Secondary | ICD-10-CM | POA: Insufficient documentation

## 2022-10-10 DIAGNOSIS — E1122 Type 2 diabetes mellitus with diabetic chronic kidney disease: Secondary | ICD-10-CM | POA: Insufficient documentation

## 2022-10-10 DIAGNOSIS — I350 Nonrheumatic aortic (valve) stenosis: Secondary | ICD-10-CM | POA: Insufficient documentation

## 2022-10-10 DIAGNOSIS — I9589 Other hypotension: Secondary | ICD-10-CM

## 2022-10-10 DIAGNOSIS — Z7902 Long term (current) use of antithrombotics/antiplatelets: Secondary | ICD-10-CM | POA: Insufficient documentation

## 2022-10-10 DIAGNOSIS — N186 End stage renal disease: Secondary | ICD-10-CM | POA: Insufficient documentation

## 2022-10-10 DIAGNOSIS — R0609 Other forms of dyspnea: Secondary | ICD-10-CM | POA: Diagnosis not present

## 2022-10-10 DIAGNOSIS — Z20822 Contact with and (suspected) exposure to covid-19: Secondary | ICD-10-CM | POA: Insufficient documentation

## 2022-10-10 DIAGNOSIS — I120 Hypertensive chronic kidney disease with stage 5 chronic kidney disease or end stage renal disease: Secondary | ICD-10-CM | POA: Insufficient documentation

## 2022-10-10 DIAGNOSIS — Z79899 Other long term (current) drug therapy: Secondary | ICD-10-CM | POA: Diagnosis not present

## 2022-10-10 DIAGNOSIS — D649 Anemia, unspecified: Secondary | ICD-10-CM | POA: Insufficient documentation

## 2022-10-10 DIAGNOSIS — Z7952 Long term (current) use of systemic steroids: Secondary | ICD-10-CM | POA: Insufficient documentation

## 2022-10-10 DIAGNOSIS — G4733 Obstructive sleep apnea (adult) (pediatric): Secondary | ICD-10-CM | POA: Insufficient documentation

## 2022-10-10 DIAGNOSIS — Z955 Presence of coronary angioplasty implant and graft: Secondary | ICD-10-CM | POA: Insufficient documentation

## 2022-10-10 DIAGNOSIS — Z7901 Long term (current) use of anticoagulants: Secondary | ICD-10-CM | POA: Insufficient documentation

## 2022-10-10 DIAGNOSIS — D631 Anemia in chronic kidney disease: Secondary | ICD-10-CM | POA: Insufficient documentation

## 2022-10-10 DIAGNOSIS — I5083 High output heart failure: Secondary | ICD-10-CM

## 2022-10-10 DIAGNOSIS — Z66 Do not resuscitate: Secondary | ICD-10-CM | POA: Diagnosis not present

## 2022-10-10 DIAGNOSIS — Z8507 Personal history of malignant neoplasm of pancreas: Secondary | ICD-10-CM | POA: Diagnosis not present

## 2022-10-10 DIAGNOSIS — E861 Hypovolemia: Secondary | ICD-10-CM

## 2022-10-10 LAB — CBC WITH DIFFERENTIAL/PLATELET
Abs Immature Granulocytes: 0.35 10*3/uL — ABNORMAL HIGH (ref 0.00–0.07)
Basophils Absolute: 0.1 10*3/uL (ref 0.0–0.1)
Basophils Relative: 1 %
Eosinophils Absolute: 0.4 10*3/uL (ref 0.0–0.5)
Eosinophils Relative: 3 %
HCT: 40.2 % (ref 39.0–52.0)
Hemoglobin: 12.7 g/dL — ABNORMAL LOW (ref 13.0–17.0)
Immature Granulocytes: 3 %
Lymphocytes Relative: 3 %
Lymphs Abs: 0.3 10*3/uL — ABNORMAL LOW (ref 0.7–4.0)
MCH: 32.8 pg (ref 26.0–34.0)
MCHC: 31.6 g/dL (ref 30.0–36.0)
MCV: 103.9 fL — ABNORMAL HIGH (ref 80.0–100.0)
Monocytes Absolute: 1.3 10*3/uL — ABNORMAL HIGH (ref 0.1–1.0)
Monocytes Relative: 11 %
Neutro Abs: 9.1 10*3/uL — ABNORMAL HIGH (ref 1.7–7.7)
Neutrophils Relative %: 79 %
Platelets: 160 10*3/uL (ref 150–400)
RBC: 3.87 MIL/uL — ABNORMAL LOW (ref 4.22–5.81)
RDW: 16.3 % — ABNORMAL HIGH (ref 11.5–15.5)
WBC: 11.5 10*3/uL — ABNORMAL HIGH (ref 4.0–10.5)
nRBC: 0.4 % — ABNORMAL HIGH (ref 0.0–0.2)

## 2022-10-10 LAB — COMPREHENSIVE METABOLIC PANEL
ALT: 15 U/L (ref 0–44)
AST: 25 U/L (ref 15–41)
Albumin: 3.1 g/dL — ABNORMAL LOW (ref 3.5–5.0)
Alkaline Phosphatase: 49 U/L (ref 38–126)
Anion gap: 20 — ABNORMAL HIGH (ref 5–15)
BUN: 61 mg/dL — ABNORMAL HIGH (ref 8–23)
CO2: 20 mmol/L — ABNORMAL LOW (ref 22–32)
Calcium: 9 mg/dL (ref 8.9–10.3)
Chloride: 97 mmol/L — ABNORMAL LOW (ref 98–111)
Creatinine, Ser: 8.02 mg/dL — ABNORMAL HIGH (ref 0.61–1.24)
GFR, Estimated: 7 mL/min — ABNORMAL LOW (ref >60)
Glucose, Bld: 109 mg/dL — ABNORMAL HIGH (ref 70–99)
Potassium: 4.9 mmol/L (ref 3.5–5.1)
Sodium: 137 mmol/L (ref 135–145)
Total Bilirubin: 0.7 mg/dL (ref 0.3–1.2)
Total Protein: 7 g/dL (ref 6.5–8.1)

## 2022-10-10 LAB — PROTIME-INR
INR: 2 — ABNORMAL HIGH (ref 0.8–1.2)
Prothrombin Time: 22.4 seconds — ABNORMAL HIGH (ref 11.4–15.2)

## 2022-10-10 LAB — RESP PANEL BY RT-PCR (RSV, FLU A&B, COVID)  RVPGX2
Influenza A by PCR: NEGATIVE
Influenza B by PCR: NEGATIVE
Resp Syncytial Virus by PCR: NEGATIVE
SARS Coronavirus 2 by RT PCR: NEGATIVE

## 2022-10-10 LAB — TROPONIN I (HIGH SENSITIVITY)
Troponin I (High Sensitivity): 97 ng/L — ABNORMAL HIGH (ref <18)
Troponin I (High Sensitivity): 91 ng/L — ABNORMAL HIGH (ref <18)

## 2022-10-10 LAB — BRAIN NATRIURETIC PEPTIDE: B Natriuretic Peptide: 250.4 pg/mL — ABNORMAL HIGH (ref 0.0–100.0)

## 2022-10-10 MED ORDER — FUROSEMIDE 10 MG/ML IJ SOLN: 40.0000 mg | Freq: Once | INTRAMUSCULAR | Status: DC

## 2022-10-10 MED ORDER — LACTATED RINGERS IV BOLUS
500.0000 mL | Freq: Once | INTRAVENOUS | Status: AC
  Administered 2022-10-10: 500 mL via INTRAVENOUS

## 2022-10-10 MED ORDER — IOHEXOL 350 MG/ML SOLN
75.0000 mL | Freq: Once | INTRAVENOUS | Status: AC | PRN
  Administered 2022-10-10: 75 mL via INTRAVENOUS

## 2022-10-10 NOTE — Consult Note (Signed)
NAME:  Sohrab Keelan., MRN:  [id number], DOB:  1956/01/08, LOS: 0 ADMISSION DATE:  10/10/2022, CONSULTATION DATE:  10/10/22 REFERRING MD:  Dr. Matilde Sprang , CHIEF COMPLAINT:  sob   History of Present Illness:  67 year old man w/ hx of pAF, stage IV renal cell cancer (details in [place]), ESRD on HD, high output heart failure presenting with worsening dyspnea, chest heaviness over past 4 months, worse in past week.  Mostly CPAP dependent of late to do any activity.  At rest can come off for a while to eat, talk etc.  He carries a working diagnosis of amio lung toxicity with some mild changes on prior CT chest and elevated ESR.  Did have some improvement symptomatically w/ the prednisone he has been on the past several months.  At CHF clinic today he was tachycardic and had diarrhea, sent to ER for fluids.  PCCM consulted to help with his dyspnea.   [facility]MR[facility]   0 Dyspnea only with strenuous exercise  1 Dyspnea when hurrying or walking up a slight hill  2 Walks slower than people of the same age because of dyspnea or has to stop for breath when walking at own pace  3 Stops for breath after walking 100 yards (55 m) or after a few minutes  4 Too dyspneic to leave house or breathless when dressing    Pertinent  Medical History  Stage IV Renal cell cancer ":1) status post right radical nephrectomy with the final pathology consistent with renal cell carcinoma, clear-cell type nuclear grade 4 measuring 10.2 cm with the tumor invades the inferior pelvis, perirenal and renal sinus fat under the care of Dr. Gloriann Loan. 2) status post robotic assisted left upper lobe wedge resection under the care of Dr. Roxan Hockey on October 16, 2020 and the final pathology showed clear-cell renal cell carcinoma with negative margins. 3) status post SBRT to left upper lobe and right lower lobe pulmonary nodules in October 2022. 4) status post treatment with immunotherapy with Keytruda 400 Mg IV every 6 weeks started Jan 13, 2021 and  axitinib 5 mg p.o. twice daily was added in September 2023.  Pembrolizumab was discontinued in November 2023 secondary to disease progression and requirement for high-dose steroids. Single agent axitinib 5 mg p.o. daily started November 2023."  High output HF- related to AVF, unclear how much this vs. Potential lung issues are driving his progressive FTT, SOB Baseline: RA = 6 RV = 36/6 PA = 29/18 (22) PCW = 7 Fick cardiac output/index = 8.1/3.4 TD CO/CI = 9.4/3.9 PVR = 1.7 WU Ao sat = 95% High SVC sat = 54%, 57% RA sat = 80% RV sat = 73% PA sat = 71%, 72% IVC sat = 55%   AVF with BP cuff at 122mm HG (systemic SBP 134mmHG) PA = 24/10 (16) Fick CO/CI = 4.6/1.9 TD CO/CI = 4.8/2.0 Ao sat = 98% PA sat = 57% RA sat = 50%   BP cuff down PA = 29/13 (21) Fick CO/CI = 7.2/3.0 Thermo CD/CI = 9.1/3.8 Ao sat = 97% PA sat = 71%  pAF OSA DM2 ESRD on HD  Significant Hospital Events: Including procedures, antibiotic start and stop dates in addition to other pertinent events   2/12 PCCM consult  Interim History / Subjective:  Consulted  Objective   Blood pressure 97/64, pulse 62, temperature 98 F (36.7 C), temperature source Axillary, resp. rate (!) 31, height 5\' 11"  (1.803 m), weight 118.4 kg, SpO2 100 %.  FiO2 (%):  [50 %] 50 %   Intake/Output Summary (Last 24 hours) at 10/10/2022 1638 Last data filed at 10/10/2022 1516 Gross per 24 hour  Intake 500 ml  Output --  Net 500 ml   Filed Weights   10/10/22 0952  Weight: 118.4 kg    Examination: General: no distress mildly tachypneic on BIPAP HENT: BIPAP in place with good seal, eyes tracking Lungs: diminished bases, otherwise clear, no accessory muscle use Cardiovascular: slightly tachy, ext warm Abdomen: soft, +BS Extremities: LUE fistula Neuro: moves all ext Skin: no rashes  Extensive workup from previous DOE symptoms reviewed.  Resolved Hospital Problem list    Dehydration, tachy, hypotensive- resolved w/  fluids; related to diarrhea and getting extra fluid off at HD  Assessment & Plan:  DOE- extensive workup done 05/31/22 for this.  Symptoms slowly progressive since.  Lungs have never looked classic for amio lung toxicity but do not have another great explanation for his ESR and he did feel better after steroids.  What I am seeing is some suggestion of dynamic airway collapse with basilar atelectasis.  I wonder if this has progressed over time, exacerbated by his now chronic chest wall pain from lobectomy in 2022.  It would explain why he turns around so rapidly with CPAP as opposed to oxygen and explain why symptoms are slowly progressive.  Unfortunately there is not much that can be done about this problem.  Moreover his baseline QoL has been progressively declining due to myriad of chronic comorbidities (HF, stage IV cancer, ESRD, physical deconditioning). - Continue CPAP PRN at home - Drop steroids to 20mg /day, discussed with Bensimhon who will taper further over time - If any further decline needs to consider hospice - Would like to sign durable DNR form after we discussed what this meant and how he would want a natural pain-free passing if possible  Recs relayed to CHF/EDP/patient/wife  Labs   CBC: Recent Labs  Lab 10/10/22 1021  WBC 11.5*  NEUTROABS 9.1*  HGB 12.7*  HCT 40.2  MCV 103.9*  PLT 025    Basic Metabolic Panel: Recent Labs  Lab 10/10/22 1021  NA 137  K 4.9  CL 97*  CO2 20*  GLUCOSE 109*  BUN 61*  CREATININE 8.02*  CALCIUM 9.0   GFR: Estimated Creatinine Clearance: 12.2 mL/min (A) (by C-G formula based on SCr of 8.02 mg/dL (H)). Recent Labs  Lab 10/10/22 1021  WBC 11.5*    Liver Function Tests: Recent Labs  Lab 10/10/22 1021  AST 25  ALT 15  ALKPHOS 49  BILITOT 0.7  PROT 7.0  ALBUMIN 3.1*   No results for input(s): "LIPASE", "AMYLASE" in the last 168 hours. No results for input(s): "AMMONIA" in the last 168 hours.  ABG    Component Value  Date/Time   PHART 7.351 08/05/2022 0910   PCO2ART 50.7 (H) 08/05/2022 0910   PO2ART 32 (LL) 08/05/2022 0910   HCO3 28.2 (H) 08/05/2022 0931   TCO2 30 08/05/2022 0931   ACIDBASEDEF 2.0 06/03/2022 1602   O2SAT 50 08/05/2022 0931     Coagulation Profile: Recent Labs  Lab 10/04/22 1024 10/10/22 1021  INR 3.6* 2.0*    Cardiac Enzymes: No results for input(s): "CKTOTAL", "CKMB", "CKMBINDEX", "TROPONINI" in the last 168 hours.  HbA1C: Hemoglobin A1C  Date/Time Value Ref Range Status  04/02/2021 08:52 AM 5.5 4.0 - 5.6 % Final  11/30/2020 08:47 AM 5.3 4.0 - 5.6 % Final   HbA1c POC (<> result, manual  entry)  Date/Time Value Ref Range Status  11/30/2020 08:47 AM 5.3 4.0 - 5.6 % Final   Hgb A1c MFr Bld  Date/Time Value Ref Range Status  05/31/2022 06:08 PM 5.3 4.8 - 5.6 % Final    Comment:    (NOTE) Pre diabetes:          5.7%-6.4%  Diabetes:              >6.4%  Glycemic control for   <7.0% adults with diabetes   10/14/2020 01:00 PM 5.5 4.8 - 5.6 % Final    Comment:    (NOTE) Pre diabetes:          5.7%-6.4%  Diabetes:              >6.4%  Glycemic control for   <7.0% adults with diabetes     CBG: No results for input(s): "GLUCAP" in the last 168 hours.  Review of Systems:    Positive Symptoms in bold:  Constitutional fevers, chills, weight loss, fatigue, anorexia, malaise  Eyes decreased vision, double vision, eye irritation  Ears, Nose, Mouth, Throat sore throat, trouble swallowing, sinus congestion  Cardiovascular chest pain, paroxysmal nocturnal dyspnea, lower ext edema, palpitations   Respiratory SOB, cough, DOE, hemoptysis, wheezing  Gastrointestinal nausea, vomiting, diarrhea  Genitourinary burning with urination, trouble urinating  Musculoskeletal joint aches, joint swelling, back pain  Integumentary  rashes, skin lesions  Neurological focal weakness, focal numbness, trouble speaking, headaches  Psychiatric depression, anxiety, confusion  Endocrine  polyuria, polydipsia, cold intolerance, heat intolerance  Hematologic abnormal bruising, abnormal bleeding, unexplained nose bleeds  Allergic/Immunologic recurrent infections, hives, swollen lymph nodes     Past Medical History:  He,  has a past medical history of Anemia, Aortic stenosis (05/2022), Arthritis, Atrial fibrillation ([place]), Bilateral renal cysts (03/23/2018), CAD (coronary artery disease), native coronary artery (06/03/2022), Cancer ([place]), Cancer ([place]), Cholelithiasis (03/19/2018), Diabetes mellitus without complication ([place]), Diverticulosis of colon (04/19/2018), Dysrhythmia, ESRD on hemodialysis ([place]), GERD (gastroesophageal reflux disease), Hepatic steatosis (03/19/2018), History of kidney stones, History of pulmonary embolus (PE) (03/2008), History of sepsis (04/20/2018), Hypertension, IBS (irritable bowel syndrome), Nocturia, OSA on CPAP, Pancreatic lesion, Peripheral vascular disease ([place]), Pneumonia, Pulmonary nodule, Right renal mass (04/10/2018), and S/P ureteral stent placement: 04/16/2018 (04/19/2018).   Surgical History:   Past Surgical History:  Procedure Laterality Date   AV FISTULA PLACEMENT Left 05/07/2018   Procedure: Creation of Left arm Radiocephalic Fistula;  Surgeon: Marty Heck, MD;  Location: [facility];  Service: Vascular;  Laterality: Left;   AV FISTULA PLACEMENT Left 05/15/2019   Procedure: Creation of Brachiocephalic fistula left arm;  Surgeon: Waynetta Sandy, MD;  Location: [facility];  Service: Vascular;  Laterality: Left;   [place] Left 07/15/2019   Procedure: BASILIC VEIN TRANSPOSITION SECOND STAGE;  Surgeon: Waynetta Sandy, MD;  Location: [facility];  Service: Vascular;  Laterality: Left;   BRONCHIAL BRUSHINGS  08/25/2020   Procedure: BRONCHIAL BRUSHINGS;  Surgeon: Garner Nash, DO;  Location: [facility];  Service: Pulmonary;;   BRONCHIAL NEEDLE ASPIRATION BIOPSY  08/25/2020   Procedure: BRONCHIAL NEEDLE  ASPIRATION BIOPSIES;  Surgeon: Garner Nash, DO;  Location: [facility];  Service: Pulmonary;;   BRONCHIAL WASHINGS  08/25/2020   Procedure: BRONCHIAL WASHINGS;  Surgeon: Garner Nash, DO;  Location: [facility];  Service: Pulmonary;;   COLONOSCOPY     CORONARY PRESSURE WIRE/FFR WITH 3D MAPPING N/A 06/03/2022   Procedure: Coronary Pressure Wire/FFR w/3D Mapping;  Surgeon: Early Osmond,  MD;  Location: [facility];  Service: Cardiovascular;  Laterality: N/A;   CORONARY STENT INTERVENTION N/A 06/03/2022   Procedure: CORONARY STENT INTERVENTION;  Surgeon: Early Osmond, MD;  Location: [facility];  Service: Cardiovascular;  Laterality: N/A;   CYSTOSCOPY/RETROGRADE/URETEROSCOPY/STONE EXTRACTION WITH BASKET  x2 last one 1990s approx.   CYSTOSCOPY/URETEROSCOPY/HOLMIUM LASER/STENT PLACEMENT Left 04/16/2018   Procedure: CYSTOSCOPY/LEFT URETEROSCOPY/LEFT RETROGRADE/STENT PLACEMENT;  Surgeon: Lucas Mallow, MD;  [facility]: [facility];  Service: Urology;  Laterality: Left;   EUS N/A 05/03/2018   Procedure: UPPER ENDOSCOPIC ULTRASOUND (EUS) RADIAL;  Surgeon: Milus Banister, MD;  [facility];  Service: Gastroenterology;  Laterality: N/A;   EUS N/A 05/03/2018   Procedure: UPPER ENDOSCOPIC ULTRASOUND (EUS) LINEAR;  Surgeon: Milus Banister, MD;  [facility];  Service: Gastroenterology;  Laterality: N/A;   EUS  10/05/2020   upper GI   EXTRACORPOREAL SHOCK WAVE LITHOTRIPSY  x3  last one 2004 approx.   FINE NEEDLE ASPIRATION N/A 05/03/2018   Procedure: FINE NEEDLE ASPIRATION (FNA) LINEAR;  Surgeon: Milus Banister, MD;  [facility];  Service: Gastroenterology;  Laterality: N/A;   INTERCOSTAL NERVE BLOCK Left 10/16/2020   Procedure: INTERCOSTAL NERVE BLOCK;  Surgeon: Melrose Nakayama, MD;  Location: [facility];  Service: Thoracic;  Laterality: Left;   LAPAROSCOPIC NEPHRECTOMY, HAND ASSISTED Right 05/16/2018   Procedure: HAND ASSISTED LAPAROSCOPIC RIGHT  NEPHRECTOMY;  Surgeon: Lucas Mallow, MD;  Location: [facility];  Service: Urology;  Laterality: Right;   left index finger attachment  1980s   3-4 surgeries   NODE DISSECTION Left 10/16/2020   Procedure: NODE DISSECTION;  Surgeon: Melrose Nakayama, MD;  Location: [facility];  Service: Thoracic;  Laterality: Left;   RIGHT HEART CATH N/A 08/05/2022   Procedure: RIGHT HEART CATH;  Surgeon: Jolaine Artist, MD;  Location: [facility];  Service: Cardiovascular;  Laterality: N/A;   RIGHT/LEFT HEART CATH AND CORONARY ANGIOGRAPHY N/A 06/03/2022   Procedure: RIGHT/LEFT HEART CATH AND CORONARY ANGIOGRAPHY;  Surgeon: Early Osmond, MD;  Location: [facility];  Service: Cardiovascular;  Laterality: N/A;   TOE SURGERY  1980s   in beween 2nd and 3rd toes cyst removed   UPPER GI ENDOSCOPY     VIDEO BRONCHOSCOPY WITH ENDOBRONCHIAL NAVIGATION Bilateral 08/25/2020   Procedure: VIDEO BRONCHOSCOPY WITH ENDOBRONCHIAL NAVIGATION;  Surgeon: Garner Nash, DO;  Location: [facility];  Service: Pulmonary;  Laterality: Bilateral;     Social History:   reports that he has never smoked. He has never used smokeless tobacco. He reports that he does not drink alcohol and does not use drugs.   Family History:  His family history includes Alzheimer's disease in his father and mother; Cancer - Other in his sister; Heart attack in his father; Heart disease in his father.   Allergies Allergies  Allergen Reactions   Penicillins Rash and Other (See Comments)    Has patient had a PCN reaction causing immediate rash, facial/tongue/throat swelling, SOB or lightheadedness with hypotension: yes Has patient had a PCN reaction causing severe rash involving mucus membranes or skin necrosis: no Has patient had a PCN reaction that required hospitalization: no Has patient had a PCN reaction occurring within the last 10 years: no If all of the above answers are "NO", then may proceed with Cephalosporin use.       Home Medications  Prior to Admission medications   Medication Sig Start Date End Date Taking? Authorizing  Provider  acetaminophen (TYLENOL) 500 MG tablet Take 1,500 mg by mouth every 8 (eight) hours as needed for moderate pain.    [provider]  allopurinol (ZYLOPRIM) 100 MG tablet Take 1 tablet (100 mg total) by mouth every evening. 04/08/21   Lorrene Reid, PA-C  atorvastatin (LIPITOR) 40 MG tablet Take 1 tablet (40 mg total) by mouth every evening. 10/19/20   Girtha Rm, Wilder Glade, PA-C  axitinib (INLYTA) 5 MG tablet Take one tablet daily 09/01/22   Wyatt Portela, MD  blood glucose meter kit and supplies Dispense based on patient and insurance preference. Use up to four times daily as directed. (FOR ICD-10 E10.9, E11.9). 12/22/18   Danford, Suann Larry, MD  clopidogrel (PLAVIX) 75 MG tablet Take 1 tablet (75 mg total) by mouth daily with breakfast. Patient not taking: Reported on 10/10/2022 06/07/22   Lavina Hamman, MD  diphenhydramine-acetaminophen (TYLENOL PM) 25-500 MG TABS tablet Take 3 tablets by mouth at bedtime as needed (sleep/headache).    [provider]  ferric citrate (AURYXIA) 1 GM 210 MG(Fe) tablet Take 1 tablet (210 mg total) by mouth daily with supper. 10/19/20   Gold, Wayne E, PA-C  fluticasone (FLONASE) 50 MCG/ACT nasal spray Place 1 spray into both nostrils daily as needed for allergies or rhinitis.    [provider]  glucose blood (ACCU-CHEK GUIDE) test strip USE 1 STRIP TO CHECK FASTING BLOOD SUGAR AND 2 HOURS AFTER LARGEST MEAL 08/02/21   Abonza, Maritza, PA-C  loperamide (IMODIUM A-D) 2 MG tablet Take 2 mg by mouth 4 (four) times daily as needed for diarrhea or loose stools.    [provider]  loratadine (CLARITIN) 10 MG tablet Take 10 mg by mouth every evening.     [provider]  Menthol, Topical Analgesic, (BIOFREEZE EX) Apply 1 Application topically as needed (pain).    [provider]  metoprolol succinate (TOPROL  XL) 25 MG 24 hr tablet Take 1 tablet (25 mg total) by mouth daily. 08/24/22   Bensimhon, Shaune Pascal, MD  midodrine (PROAMATINE) 10 MG tablet Take 1 tablet (10 mg total) by mouth 3 (three) times daily as needed (for hypotension/dialysis). AS ordered. Take In the evening 10 mg on Mon. Wed and Friday Take 20 mg on Dialysis days Tues, Thurs and Saturday. 10 mg when he gets there and 10 mg half way through. 12/03/21   Elouise Munroe, MD  nitroGLYCERIN (NITROSTAT) 0.4 MG SL tablet Place 1 tablet (0.4 mg total) under the tongue every 5 (five) minutes as needed for chest pain. 03/07/19   Swayze, Ava, DO  omeprazole (PRILOSEC OTC) 20 MG tablet Take 40 mg by mouth daily before breakfast.     [provider]  pembrolizumab (KEYTRUDA) 100 MG/4ML SOLN Inject into the vein every 6 (six) weeks. Infused at providers office 01/07/21   [provider]  predniSONE (DELTASONE) 10 MG tablet Take 3 tablets (30 mg total) by mouth daily with breakfast. 08/24/22   Bensimhon, Shaune Pascal, MD  prochlorperazine (COMPAZINE) 10 MG tablet Take 1 tablet (10 mg total) by mouth every 6 (six) hours as needed for nausea or vomiting. 03/09/22   Wyatt Portela, MD  Sennosides (SENOKOT EXTRA STRENGTH) 17.2 MG TABS Take 51.6 mg by mouth at bedtime as needed (constipation).    [provider]  traMADol (ULTRAM) 50 MG tablet TAKE 1 TABLET BY MOUTH EVERY 6 HOURS AS NEEDED FOR PAIN 07/29/22   Wyatt Portela, MD  warfarin (COUMADIN) 3  MG tablet TAKE 1 TO 2 TABLETS BY MOUTH ONCE DAILY AS DIRECTED BY THE COUMADIN CLINIC 08/19/22   Elouise Munroe, MD     Critical care time: N/A

## 2022-10-10 NOTE — Progress Notes (Signed)
Chaplain responded to request for Advance Directives.  Chaplain greeted pt and wife at bedside, providing a meaningful presence.    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 Pt explained death was not the end for him as he was going to be reborn in [place] offered reflective listening,  supporting pt's belief Chaplain explained AD to pt and wife. Both decided as pt was about to be discharged they both wanted to wait to fill out form and have it notarized later.  Chaplain prayed with pt and wife at bedside.  George Lewis

## 2022-10-10 NOTE — ED Provider Notes (Signed)
[facility] Provider Note  CSN: [id number] Arrival date & time:    Chief Complaint(s) Shortness of Breath  HPI George Lewis. is a 67 y.o. male with PMH paroxysmal A-fib on Coumadin, OSA, stage IV renal cell carcinoma status post nephrectomy with new lung metastases, ESRD on hemodialysis Tuesday Thursday Saturday who presents emergency department for evaluation of shortness of breath and fatigue.  Patient states that he received dialysis on 10/07/2022 and they have been increasingly pulling more fluid off and took 4 L off last session.  He states that since then he has been feeling very weak, fatigued, shortness of breath.  He was seen by Dr. Haroldine Laws in the heart failure clinic today and transferred to the emergency department for further evaluation.  Here in the emergency room, patient is tachypneic, tachycardic but does not appear grossly fluid overloaded.  He endorses shortness of breath but denies chest pain, abdominal pain, nausea, vomiting, headache, fever or other systemic symptoms.   Past Medical History Past Medical History:  Diagnosis Date   Anemia    Aortic stenosis 05/2022   Arthritis    knees   Atrial fibrillation ([place])    Bilateral renal cysts 03/23/2018   Noted MRI ABD   CAD (coronary artery disease), native coronary artery 06/03/2022   s/p PCI mLAD   Cancer [facility])    right kidney cancer   Cancer ([place])    pancreatic   Cholelithiasis 03/19/2018   noted on CT AB/Pelvis   Diabetes mellitus without complication ([place])    Diverticulosis of colon 04/19/2018   Noted on CT abd/pelvis   Dysrhythmia    afib   ESRD on hemodialysis [facility])    nephrologist-- dr Hollie Salk [name]);  05-01-2018 has not started dialysis, scheduled for AV fistula creation 05-07-2018   GERD (gastroesophageal reflux disease)    Hepatic steatosis 03/19/2018   Mild diffuse, noted on CT AB/Pelvis   History of kidney stones    History of pulmonary  embolus (PE) 03/2008   treated with coumadin for 6 months   History of sepsis 04/20/2018   per discharge note , probable UTI   Hypertension    IBS (irritable bowel syndrome)    Nocturia    OSA on CPAP    uses CPAP nightly   Pancreatic lesion    0.4cm cystic per CT 07/ 2019   Peripheral vascular disease (HCC)    Pneumonia    x 1   Pulmonary nodule    Solid 69mm Right Lower Lobe   Right renal mass 04/10/2018   new dx--  scheduled for nephrectomy 05-16-2018   S/P ureteral stent placement: 04/16/2018 04/19/2018   Patient Active Problem List   Diagnosis Date Noted   Acute respiratory failure with hypoxia (HCC)    Pleural effusion    SOB (shortness of breath)    PNA (pneumonia) 05/31/2022   S/P robot-assisted surgical procedure 10/16/2020   Lung nodules 08/12/2020   Secondary hypercoagulable state ([place]) 10/07/2019   Solitary pulmonary nodule 08/08/2019   Long term (current) use of anticoagulants 04/26/2019   Diarrhea, unspecified 04/20/2019   Near syncope 04/20/2019   NSVT (nonsustained ventricular tachycardia) ([place]) 04/20/2019   ESRD on hemodialysis ([place]) 04/20/2019   Paroxysmal atrial fibrillation ([place]) 04/20/2019   Hypovolemia 04/20/2019   Coagulation defect, unspecified ([place]) 03/26/2019   Anemia of chronic disease 03/20/2019   Dyspnea, unspecified 03/20/2019   Hyperlipidemia, unspecified 03/20/2019   Hypertensive chronic kidney disease with stage 1  through stage 4 chronic kidney disease, or unspecified chronic kidney disease 03/20/2019   Hypokalemia 03/20/2019   Iron deficiency anemia, unspecified 03/20/2019   Malignant neoplasm of right kidney, except renal pelvis ([place]) 03/20/2019   Secondary hyperparathyroidism of renal origin ([place]) 03/20/2019   Chest pain 03/06/2019   DM2 (diabetes mellitus, type 2) ([place]) 03/06/2019   Obesity (BMI 30-39.9) 03/06/2019   Goals of care, counseling/discussion 01/28/2019   Class 3 severe obesity due to excess calories with serious  comorbidity and body mass index (BMI) of 40.0 to 44.9 in adult [facility]) 01/15/2019   Fatigue 01/15/2019   History of ventricular tachycardia 01/15/2019   DKA (diabetic ketoacidoses) 12/19/2018   Newly diagnosed diabetes ([employer]) 12/19/2018   Syncope 06/12/2018   Ileus ([place])    SVT (supraventricular tachycardia)    Renal mass, right 05/16/2018   Mass of pancreas    Abnormal CT of the abdomen    Bacteremia due to Enterococcus 04/23/2018   Sepsis ([place]) 04/19/2018   End stage renal disease ([place]) 04/19/2018   Dehydration 04/19/2018   UTI (urinary tract infection): Probable 04/19/2018   Leukocytosis 04/19/2018   Anemia 04/19/2018   Renal cell carcinoma, right ([place]) 04/19/2018   S/P ureteral stent placement: 04/16/2018 04/19/2018   GERD (gastroesophageal reflux disease) 04/19/2018   OSA on CPAP 04/19/2018   HTN (hypertension) 04/19/2018   Home Medication(s) Prior to Admission medications   Medication Sig Start Date End Date Taking? Authorizing Provider  acetaminophen (TYLENOL) 500 MG tablet Take 1,500 mg by mouth every 8 (eight) hours as needed for moderate pain.    [provider]  allopurinol (ZYLOPRIM) 100 MG tablet Take 1 tablet (100 mg total) by mouth every evening. 04/08/21   Lorrene Reid, PA-C  atorvastatin (LIPITOR) 40 MG tablet Take 1 tablet (40 mg total) by mouth every evening. 10/19/20   Girtha Rm, Wilder Glade, PA-C  axitinib (INLYTA) 5 MG tablet Take one tablet daily 09/01/22   Wyatt Portela, MD  blood glucose meter kit and supplies Dispense based on patient and insurance preference. Use up to four times daily as directed. (FOR ICD-10 E10.9, E11.9). 12/22/18   Danford, Suann Larry, MD  clopidogrel (PLAVIX) 75 MG tablet Take 1 tablet (75 mg total) by mouth daily with breakfast. Patient not taking: Reported on 10/10/2022 06/07/22   Lavina Hamman, MD  diphenhydramine-acetaminophen (TYLENOL PM) 25-500 MG TABS tablet Take 3 tablets by mouth at bedtime as needed (sleep/headache).     [provider]  ferric citrate (AURYXIA) 1 GM 210 MG(Fe) tablet Take 1 tablet (210 mg total) by mouth daily with supper. 10/19/20   Gold, Wayne E, PA-C  fluticasone (FLONASE) 50 MCG/ACT nasal spray Place 1 spray into both nostrils daily as needed for allergies or rhinitis.    [provider]  glucose blood (ACCU-CHEK GUIDE) test strip USE 1 STRIP TO CHECK FASTING BLOOD SUGAR AND 2 HOURS AFTER LARGEST MEAL 08/02/21   Abonza, Maritza, PA-C  loperamide (IMODIUM A-D) 2 MG tablet Take 2 mg by mouth 4 (four) times daily as needed for diarrhea or loose stools.    [provider]  loratadine (CLARITIN) 10 MG tablet Take 10 mg by mouth every evening.     [provider]  Menthol, Topical Analgesic, (BIOFREEZE EX) Apply 1 Application topically as needed (pain).    [provider]  metoprolol succinate (TOPROL XL) 25 MG 24 hr tablet Take 1 tablet (25 mg total) by mouth daily. 08/24/22   Bensimhon, Shaune Pascal, MD  midodrine (PROAMATINE) 10 MG tablet Take 1 tablet (10 mg total) by mouth 3 (three) times daily as needed (for hypotension/dialysis). AS ordered. Take In the evening 10 mg on Mon. Wed and Friday Take 20 mg on Dialysis days Tues, Thurs and Saturday. 10 mg when he gets there and 10 mg half way through. 12/03/21   Elouise Munroe, MD  nitroGLYCERIN (NITROSTAT) 0.4 MG SL tablet Place 1 tablet (0.4 mg total) under the tongue every 5 (five) minutes as needed for chest pain. 03/07/19   Swayze, Ava, DO  omeprazole (PRILOSEC OTC) 20 MG tablet Take 40 mg by mouth daily before breakfast.     [provider]  pembrolizumab (KEYTRUDA) 100 MG/4ML SOLN Inject into the vein every 6 (six) weeks. Infused at providers office 01/07/21   [provider]  predniSONE (DELTASONE) 10 MG tablet Take 3 tablets (30 mg total) by mouth daily with breakfast. 08/24/22   Bensimhon, Shaune Pascal, MD  prochlorperazine (COMPAZINE) 10 MG tablet Take 1 tablet (10 mg total) by mouth every 6  (six) hours as needed for nausea or vomiting. 03/09/22   Wyatt Portela, MD  Sennosides (SENOKOT EXTRA STRENGTH) 17.2 MG TABS Take 51.6 mg by mouth at bedtime as needed (constipation).    [provider]  traMADol (ULTRAM) 50 MG tablet TAKE 1 TABLET BY MOUTH EVERY 6 HOURS AS NEEDED FOR PAIN 07/29/22   Wyatt Portela, MD  warfarin (COUMADIN) 3 MG tablet TAKE 1 TO 2 TABLETS BY MOUTH ONCE DAILY AS DIRECTED BY THE COUMADIN CLINIC 08/19/22   Elouise Munroe, MD                                                                                                                                    Past Surgical History Past Surgical History:  Procedure Laterality Date   AV FISTULA PLACEMENT Left 05/07/2018   Procedure: Creation of Left arm Radiocephalic Fistula;  Surgeon: Marty Heck, MD;  Location: [facility] [place];  Service: Vascular;  Laterality: Left;   AV FISTULA PLACEMENT Left 05/15/2019   Procedure: Creation of Brachiocephalic fistula left arm;  Surgeon: Waynetta Sandy, MD;  Location: [facility];  Service: Vascular;  Laterality: Left;   [place] Left 07/15/2019   Procedure: BASILIC VEIN TRANSPOSITION SECOND STAGE;  Surgeon: Waynetta Sandy, MD;  Location: [facility];  Service: Vascular;  Laterality: Left;   BRONCHIAL BRUSHINGS  08/25/2020   Procedure: BRONCHIAL BRUSHINGS;  Surgeon: Garner Nash, DO;  Location: [place] [facility];  Service: Pulmonary;;   BRONCHIAL NEEDLE ASPIRATION BIOPSY  08/25/2020   Procedure: BRONCHIAL NEEDLE ASPIRATION BIOPSIES;  Surgeon: Garner Nash, DO;  Location: [facility];  Service: Pulmonary;;   BRONCHIAL WASHINGS  08/25/2020   Procedure: BRONCHIAL WASHINGS;  Surgeon: Garner Nash, DO;  Location: [facility];  Service: Pulmonary;;   COLONOSCOPY     CORONARY PRESSURE WIRE/FFR WITH 3D MAPPING N/A 06/03/2022  Procedure: Coronary Pressure Wire/FFR w/3D Mapping;  Surgeon: Early Osmond, MD;  Location: [facility];   Service: Cardiovascular;  Laterality: N/A;   CORONARY STENT INTERVENTION N/A 06/03/2022   Procedure: CORONARY STENT INTERVENTION;  Surgeon: Early Osmond, MD;  Location: [place] [facility];  Service: Cardiovascular;  Laterality: N/A;   CYSTOSCOPY/RETROGRADE/URETEROSCOPY/STONE EXTRACTION WITH BASKET  x2 last one 1990s approx.   CYSTOSCOPY/URETEROSCOPY/HOLMIUM LASER/STENT PLACEMENT Left 04/16/2018   Procedure: CYSTOSCOPY/LEFT URETEROSCOPY/LEFT RETROGRADE/STENT PLACEMENT;  Surgeon: Lucas Mallow, MD;  [facility]: [facility];  Service: Urology;  Laterality: Left;   EUS N/A 05/03/2018   Procedure: UPPER ENDOSCOPIC ULTRASOUND (EUS) RADIAL;  Surgeon: Milus Banister, MD;  [facility];  Service: Gastroenterology;  Laterality: N/A;   EUS N/A 05/03/2018   Procedure: UPPER ENDOSCOPIC ULTRASOUND (EUS) LINEAR;  Surgeon: Milus Banister, MD;  [facility];  Service: Gastroenterology;  Laterality: N/A;   EUS  10/05/2020   upper GI   EXTRACORPOREAL SHOCK WAVE LITHOTRIPSY  x3  last one 2004 approx.   FINE NEEDLE ASPIRATION N/A 05/03/2018   Procedure: FINE NEEDLE ASPIRATION (FNA) LINEAR;  Surgeon: Milus Banister, MD;  [facility];  Service: Gastroenterology;  Laterality: N/A;   INTERCOSTAL NERVE BLOCK Left 10/16/2020   Procedure: INTERCOSTAL NERVE BLOCK;  Surgeon: [place] Nakayama, MD;  Location: [facility];  Service: Thoracic;  Laterality: Left;   LAPAROSCOPIC NEPHRECTOMY, HAND ASSISTED Right 05/16/2018   Procedure: HAND ASSISTED LAPAROSCOPIC RIGHT NEPHRECTOMY;  Surgeon: Lucas Mallow, MD;  Location[facility];  Service: Urology;  Laterality: Right;   left index finger attachment  1980s   3-4 surgeries   NODE DISSECTION Left 10/16/2020   Procedure: NODE DISSECTION;  Surgeon: [place] Nakayama, MD;  Location: [place];  Service: Thoracic;  Laterality: Left;   RIGHT HEART CATH N/A 08/05/2022   Procedure: RIGHT HEART CATH;  Surgeon: Jolaine Artist, MD;  Location: [facility];  Service: Cardiovascular;  Laterality: N/A;   RIGHT/LEFT HEART CATH AND CORONARY ANGIOGRAPHY N/A 06/03/2022   Procedure: RIGHT/LEFT HEART CATH AND CORONARY ANGIOGRAPHY;  Surgeon: Early Osmond, MD;  Location: [facility];  Service: Cardiovascular;  Laterality: N/A;   TOE SURGERY  1980s   in beween 2nd and 3rd toes cyst removed   UPPER GI ENDOSCOPY     VIDEO BRONCHOSCOPY WITH ENDOBRONCHIAL NAVIGATION Bilateral 08/25/2020   Procedure: VIDEO BRONCHOSCOPY WITH ENDOBRONCHIAL NAVIGATION;  Surgeon: Garner Nash, DO;  Location: [facility];  Service: Pulmonary;  Laterality: Bilateral;   Family History Family History  Problem Relation Age of Onset   Alzheimer's disease Mother    Alzheimer's disease Father    Heart disease Father    Heart attack Father    Cancer - Other Sister     Social History Social History   Tobacco Use   Smoking status: Never   Smokeless tobacco: Never  Vaping Use   Vaping Use: Never used  Substance Use Topics   Alcohol use: Never   Drug use: Never   Allergies Penicillins  Review of Systems Review of Systems  Respiratory:  Positive for shortness of breath.     Physical Exam Vital Signs  I have reviewed the triage vital signs BP 120/70 (BP Location: Right Arm)   Pulse (!) 109   Temp 97.7 F (36.5 C) (Axillary)   Resp (!) 31   Ht 5\' 11"  (1.803 m)   Wt 118.4 kg   SpO2 100%  BMI 36.40 kg/m   Physical Exam Constitutional:      General: He is not in acute distress.    Appearance: Normal appearance. He is ill-appearing.  HENT:     Head: Normocephalic and atraumatic.     Nose: No congestion or rhinorrhea.  Eyes:     General:        Right eye: No discharge.        Left eye: No discharge.     Extraocular Movements: Extraocular movements intact.     Pupils: Pupils are equal, round, and reactive to light.  Cardiovascular:     Rate and Rhythm: Normal rate and regular rhythm.     Heart sounds: No murmur heard. Pulmonary:      Effort: Tachypnea, accessory muscle usage and respiratory distress present.     Breath sounds: No wheezing or rales.  Abdominal:     General: There is no distension.     Tenderness: There is no abdominal tenderness.  Musculoskeletal:        General: Normal range of motion.     Cervical back: Normal range of motion.  Skin:    General: Skin is warm and dry.  Neurological:     General: No focal deficit present.     Mental Status: He is alert.     ED Results and Treatments Labs (all labs ordered are listed, but only abnormal results are displayed) Labs Reviewed  COMPREHENSIVE METABOLIC PANEL - Abnormal; Notable for the following components:      Result Value   Chloride 97 (*)    CO2 20 (*)    Glucose, Bld 109 (*)    BUN 61 (*)    Creatinine, Ser 8.02 (*)    Albumin 3.1 (*)    GFR, Estimated 7 (*)    Anion gap 20 (*)    All other components within normal limits  CBC WITH DIFFERENTIAL/PLATELET - Abnormal; Notable for the following components:   WBC 11.5 (*)    RBC 3.87 (*)    Hemoglobin 12.7 (*)    MCV 103.9 (*)    RDW 16.3 (*)    nRBC 0.4 (*)    Neutro Abs 9.1 (*)    Lymphs Abs 0.3 (*)    Monocytes Absolute 1.3 (*)    Abs Immature Granulocytes 0.35 (*)    All other components within normal limits  BRAIN NATRIURETIC PEPTIDE - Abnormal; Notable for the following components:   B Natriuretic Peptide 250.4 (*)    All other components within normal limits  PROTIME-INR - Abnormal; Notable for the following components:   Prothrombin Time 22.4 (*)    INR 2.0 (*)    All other components within normal limits  TROPONIN I (HIGH SENSITIVITY) - Abnormal; Notable for the following components:   Troponin I (High Sensitivity) 97 (*)    All other components within normal limits  TROPONIN I (HIGH SENSITIVITY) - Abnormal; Notable for the following components:   Troponin I (High Sensitivity) 91 (*)    All other components within normal limits  RESP PANEL BY RT-PCR (RSV, FLU A&B, COVID)   [id number]  Radiology CT Angio Chest PE W and/or Wo Contrast  Result Date: 10/10/2022 CLINICAL DATA:  Dyspnea.  Metastatic renal cell carcinoma. EXAM: CT ANGIOGRAPHY CHEST WITH CONTRAST TECHNIQUE: Multidetector CT imaging of the chest was performed using the standard protocol during bolus administration of intravenous contrast. Multiplanar CT image reconstructions and MIPs were obtained to evaluate the vascular anatomy. RADIATION DOSE REDUCTION: This exam was performed according to the departmental dose-optimization program which includes automated exposure control, adjustment of the mA and/or kV according to patient size and/or use of iterative reconstruction technique. CONTRAST:  43mL OMNIPAQUE IOHEXOL 350 MG/ML SOLN COMPARISON:  X-ray earlier 10/10/2022 and older. CT scans 09/30/2022 and earlier. CT angiogram 05/31/2022 as well FINDINGS: Cardiovascular: Heart is slightly enlarged. No pericardial effusion. Coronary artery calcifications are seen. There is also calcifications towards the aortic valve. The thoracic aorta has a normal course and caliber. Scattered breathing motion. This can limit evaluation of pulmonary emboli. No obvious large or central embolus. Evaluation of small peripheral emboli particularly difficult to completely exclude. Mediastinum/Nodes: Atrophic thyroid gland. No specific abnormal lymph node enlargement identified in the axillary regions, hilum or mediastinum. The exception is along the posterior mediastinum medial to the descending thoracic aorta just above the diaphragm where there is an abnormal lymph node that measured 2 cm in diameter in October 2023, 18 x 16 mm in September 30, 2022 and today on image 103 of series 5 eighteen by 16 mm. Normal caliber thoracic esophagus. Lungs/Pleura: Significant breathing motion. Dependent atelectasis identified,  left-greater-than-right. No pneumothorax. These changes are similar to the prior exam. There is a calcified nodule left lung base as well on series 5, image 90 consistent with old granulomatous disease. Stable 9 mm nodule in the lingula on image 58 of series 5. Upper Abdomen: Adrenal glands are preserved in the upper abdomen. Gallbladder is distended. Musculoskeletal: Curvature of the spine. Scattered degenerative changes of the spine. Review of the MIP images confirms the above findings. IMPRESSION: Breathing motion limits evaluation of pulmonary emboli. No large or central embolus. More peripheral small emboli are not excluded. Persistent abnormal lymph node in the posteroinferior mediastinum. Stable lingular lung nodule. Recommend continued surveillance in 3 months or as per the patient's neoplasm history Enlarged heart. Persistent bilateral lung base opacities and trace fluid. Aortic Atherosclerosis (ICD10-I70.0). Electronically Signed   By: Jill Side M.D.   On: 10/10/2022 15:24   DG Chest Portable 1 View  Result Date: 10/10/2022 CLINICAL DATA:  Dyspnea EXAM: PORTABLE CHEST 1 VIEW COMPARISON:  Chest x-ray dated fifth 2023 FINDINGS: Cardiac and mediastinal contours are unchanged. Elevation of the right hemidiaphragm. Bibasilar atelectasis. No large pleural effusion or evidence of pneumothorax. IMPRESSION: No active disease. Electronically Signed   By: Yetta Glassman M.D.   On: 10/10/2022 10:14    Pertinent labs & imaging results that were available during my care of the patient were reviewed by me and considered in my medical decision making (see MDM for details).  Medications Ordered in ED Medications  lactated ringers bolus 500 mL (0 mLs Intravenous Stopped 10/10/22 1516)  iohexol (OMNIPAQUE) 350 MG/ML injection 75 mL (75 mLs Intravenous Contrast Given 10/10/22 1428)  Procedures .Critical Care  Performed by: Teressa Lower, MD Authorized by: Teressa Lower, MD   Critical care provider statement:    Critical care time (minutes):  30   Critical care was necessary to treat or prevent imminent or life-threatening deterioration of the following conditions:  Respiratory failure   Critical care was time spent personally by me on the following activities:  Development of treatment plan with patient or surrogate, discussions with consultants, evaluation of patient's response to treatment, examination of patient, ordering and review of laboratory studies, ordering and review of radiographic studies, ordering and performing treatments and interventions, pulse oximetry, re-evaluation of patient's condition and review of old charts   (including critical care time)  Medical Decision Making / ED Course   This patient presents to the ED for concern of shortness of breath, this involves an extensive number of treatment options, and is a complaint that carries with it a high risk of complications and morbidity.  The differential diagnosis includes Pe, PTX, Pulmonary Edema, ARDS, COPD/Asthma, ACS, CHF exacerbation, Arrhythmia, Pericardial Effusion/Tamponade, Anemia, Sepsis, Acidosis/Hypercapnia, Anxiety, Viral URI  MDM: Patient seen emergency room for evaluation of shortness of breath.  Physical exam reveals an ill-appearing patient with tachypnea and mild accessory muscle use but is otherwise unremarkable.  Laboratory evaluation with a leukocytosis to 11.5, hemoglobin 12.7, BUN 61 with creatinine 8.02 consistent with his history of ESRD on hemodialysis.  INR is 2.0, BNP 250.4 with high-sensitivity troponin 97 with delta troponin downtrending to 91 likely secondary to decreased renal clearance.  Chest x-ray is largely unremarkable and on my reevaluation, patient's tachypnea was actually worsening and I had a discussion with the patient and the patient's wife who states that  at rest at home, patient is chronically on nasal CPAP.  Patient was placed on BiPAP after learning about patient's baseline positive pressure requirements and a PE study was obtained that shows persistent bilateral base opacities, small pleural effusion unchanged from previous and no large PE.  On my reevaluation after the patient had been on BiPAP for short while, patient looked dramatically improved.  His tachypnea had resolved and the patient was quite comfortable in the emergency department.  I spoke with the pulmonologist on-call Dr. Tamala Julian who came and evaluated the patient at bedside and we are both in agreement that the patient has had positive pressure requirements for over 6 months now and there would likely not be any true benefit to hospital admission as this appears to be the patient's baseline.  Presentation that the patient's cardiologist office today was dramatic due to the patient being off of all oxygen and positive pressure and walking from the car into the clinic.  Both the patient's wife and the patient states that he is almost never off of the nasal CPAP and this was quite a bit of exertion for the patient.  Dr. Tamala Julian of pulmonology is recommending decreasing his steroid regimen and they will help establish outpatient follow-up with the patient.  Patient ultimately transitioned to DNR after discussion with the pulmonology team, and despite being on BiPAP, the patient is currently on his home positive pressure requirements.  I did offer admission to the patient, but using shared decision-making, the patient is requesting to go home with outpatient follow-up.  I do not think that this is unreasonable given his respiratory baseline and thus he was discharged with outpatient pulmonology follow-up   Additional history obtained: -Additional history obtained from wife -External records from outside source obtained and reviewed including: Chart  review including previous notes, labs, imaging,  consultation notes   Lab Tests: -I ordered, reviewed, and interpreted labs.   The pertinent results include:   Labs Reviewed  COMPREHENSIVE METABOLIC PANEL - Abnormal; Notable for the following components:      Result Value   Chloride 97 (*)    CO2 20 (*)    Glucose, Bld 109 (*)    BUN 61 (*)    Creatinine, Ser 8.02 (*)    Albumin 3.1 (*)    GFR, Estimated 7 (*)    Anion gap 20 (*)    All other components within normal limits  CBC WITH DIFFERENTIAL/PLATELET - Abnormal; Notable for the following components:   WBC 11.5 (*)    RBC 3.87 (*)    Hemoglobin 12.7 (*)    MCV 103.9 (*)    RDW 16.3 (*)    nRBC 0.4 (*)    Neutro Abs 9.1 (*)    Lymphs Abs 0.3 (*)    Monocytes Absolute 1.3 (*)    Abs Immature Granulocytes 0.35 (*)    All other components within normal limits  BRAIN NATRIURETIC PEPTIDE - Abnormal; Notable for the following components:   B Natriuretic Peptide 250.4 (*)    All other components within normal limits  PROTIME-INR - Abnormal; Notable for the following components:   Prothrombin Time 22.4 (*)    INR 2.0 (*)    All other components within normal limits  TROPONIN I (HIGH SENSITIVITY) - Abnormal; Notable for the following components:   Troponin I (High Sensitivity) 97 (*)    All other components within normal limits  TROPONIN I (HIGH SENSITIVITY) - Abnormal; Notable for the following components:   Troponin I (High Sensitivity) 91 (*)    All other components within normal limits  RESP PANEL BY RT-PCR (RSV, FLU A&B, COVID)  [id number]         Imaging Studies ordered: I ordered imaging studies including chest x-ray, CT PE I independently visualized and interpreted imaging. I agree with the radiologist interpretation   Medicines ordered and prescription drug management: Meds ordered this encounter  Medications   lactated ringers bolus 500 mL   iohexol (OMNIPAQUE) 350 MG/ML injection 75 mL   DISCONTD: furosemide (LASIX) injection 40 mg    -I have reviewed  the patients home medicines and have made adjustments as needed  Critical interventions BiPAP, supplemental oxygen  Consultations Obtained: I requested consultation with the pulmonologist Dr. Tamala Julian,  and discussed lab and imaging findings as well as pertinent plan - they recommend: Outpatient follow-up given patient's goals of care   Cardiac Monitoring: The patient was maintained on a cardiac monitor.  I personally viewed and interpreted the cardiac monitored which showed an underlying rhythm of: Sinus tachycardia  Social Determinants of Health:  Factors impacting patients care include: none   Reevaluation: After the interventions noted above, I reevaluated the patient and found that they have :improved  Co morbidities that complicate the patient evaluation  Past Medical History:  Diagnosis Date   Anemia    Aortic stenosis 05/2022   Arthritis    knees   Atrial fibrillation ([place])    Bilateral renal cysts 03/23/2018   Noted MRI ABD   CAD (coronary artery disease), native coronary artery 06/03/2022   s/p PCI mLAD   Cancer [facility])    right kidney cancer   Cancer ([place])    pancreatic   Cholelithiasis 03/19/2018   noted on CT AB/Pelvis   Diabetes mellitus without complication ([place])  Diverticulosis of colon 04/19/2018   Noted on CT abd/pelvis   Dysrhythmia    afib   ESRD on hemodialysis [facility])    nephrologist-- dr Hollie Salk [name]);  05-01-2018 has not started dialysis, scheduled for AV fistula creation 05-07-2018   GERD (gastroesophageal reflux disease)    Hepatic steatosis 03/19/2018   Mild diffuse, noted on CT AB/Pelvis   History of kidney stones    History of pulmonary embolus (PE) 03/2008   treated with coumadin for 6 months   History of sepsis 04/20/2018   per discharge note , probable UTI   Hypertension    IBS (irritable bowel syndrome)    Nocturia    OSA on CPAP    uses CPAP nightly   Pancreatic lesion    0.4cm cystic per CT 07/ 2019   Peripheral  vascular disease (HCC)    Pneumonia    x 1   Pulmonary nodule    Solid 90mm Right Lower Lobe   Right renal mass 04/10/2018   new dx--  scheduled for nephrectomy 05-16-2018   S/P ureteral stent placement: 04/16/2018 04/19/2018      Dispostion: I considered admission for this patient, but using shared decision making and because the patient is currently always on positive pressure at home, he is requesting outpatient pulmonology follow-up which is not unreasonable at this time.  Patient then discharged     Final Clinical Impression(s) / ED Diagnoses Final diagnoses:  SOB (shortness of breath)     @PCDICTATION @    Teressa Lower, MD 10/10/22 1805

## 2022-10-10 NOTE — Telephone Encounter (Signed)
Spoke with Roselyn Reef the ED charge nurse to notify her of that we are sending the patient to the ED with a HR 140-150's, BP 90/50, SOB and unable to stand. Also he has been having diarrhea and chills.

## 2022-10-10 NOTE — Progress Notes (Signed)
ADVANCED HF CLINIC NOTE  PCP: Lorrene Reid, PA-C Primary Cardiologist: Elouise Munroe, MD Nephrology:  Dr. Johnney Ou Oncologist: Dr. Alen Blew  HPI:  George Lewis. 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    Started HD 3 years ago. Initially had AVF in left wrist but this failed immediately. Had HD through  perm cath for a while then established LUE AVF which has worked well since it was placed > 2 years ago.    Reports 2 month h/o mild DOE. Says it got really bad after starting En[place]ta about a week ago for his lung CA.    Came to ER with CP and SOB. Hstrop and ECG un remarkable. Echo EF 65% with mild to moderate AS. RV normal size and function. CXR basilar atx    Underwent R/L cath had moderate CAD visually but mid LAD 60 % was hemodynamically significant by FFR so had PCI/stenting   RHC with RA 4 PA mean 19 PCWP 5 PA sat x 1 = 88% (no recheck). CO 20.6 L/min. Thermodilution and shunt run not performed.   During hospitalization concern for amio toxicity. ESR 79. CT without PE or clear evidence of toxicity. Amio stopped. Received prednisone   Underwent RHC with AVF compression on 08/05/22  RA = 6 RV = 36/6 PA = 29/18 (22) PCW = 7 Fick cardiac output/index = 8.1/3.4 TD CO/CI = 9.4/3.9 PVR = 1.7 WU Ao sat = 95% High SVC sat = 54%, 57% RA sat = 80% RV sat = 73% PA sat = 71%, 72% IVC sat = 55%   AVF with BP cuff at 193mm HG (systemic SBP 165mmHG) PA = 24/10 (16) Fick CO/CI = 4.6/1.9 TD CO/CI = 4.8/2.0 Ao sat = 98% PA sat = 57% RA sat = 50%   BP cuff down PA = 29/13 (21) Fick CO/CI = 7.2/3.0 Thermo CD/CI = 9.1/3.8 Ao sat = 97% PA sat = 71%  ESR 102 -> started on prednisone. I saw him December.  Felt much better on prednisone. Was back to 75% of previous of exercise capacity. No edema, orthopnea or PND.   Returns today. Feels  terrible. Very weak. Unable to stand. Says for past 3 days has had sweats, inability to eat. Belly tight. Very SOB. No CP. SBP 90 HR 143    ROS: All systems negative except as listed in HPI, PMH and Problem List.  SH:  Social History   Socioeconomic History   Marital status: Married    Spouse name: Not on file   Number of children: Not on file   Years of education: Not on file   Highest education level: Not on file  Occupational History   Occupation: disabled  Tobacco Use   Smoking status: Never   Smokeless tobacco: Never  Vaping Use   Vaping Use: Never used  Substance and Sexual Activity   Alcohol use: Never   Drug use: Never   Sexual activity: Not on file  Other Topics Concern   Not on file  Social History Narrative   Not on file   Social Determinants of Health   Financial Resource Strain: Unknown (04/19/2018)   Overall Financial Resource Strain (CARDIA)    Difficulty of Paying Living Expenses: Patient refused  Food Insecurity: No Food Insecurity (06/05/2022)   Hunger Vital Sign  Worried About Charity fundraiser in the Last Year: Never true    [place] in the Last Year: Never true  Transportation Needs: No Transportation Needs (06/05/2022)   PRAPARE - Hydrologist (Medical): No    Lack of Transportation (Non-Medical): No  Physical Activity: Inactive (04/19/2018)   Exercise Vital Sign    Days of Exercise per Week: 0 days    Minutes of Exercise per Session: 0 min  Stress: Not on file  Social Connections: Moderately Integrated (04/19/2018)   Social Connection and Isolation Panel [NHANES]    Frequency of Communication with Friends and Family: More than three times a week    Frequency of Social Gatherings with Friends and Family: Never    Attends Religious Services: More than 4 times per year    Active Member of Clubs or Organizations: No    Attends Archivist Meetings: Never    Marital Status: Married  Human resources officer  Violence: Not At Risk (06/05/2022)   Humiliation, Afraid, Rape, and Kick questionnaire    Fear of Current or Ex-Partner: No    Emotionally Abused: No    Physically Abused: No    Sexually Abused: No    FH:  Family History  Problem Relation Age of Onset   Alzheimer's disease Mother    Alzheimer's disease Father    Heart disease Father    Heart attack Father    Cancer - Other Sister     Past Medical History:  Diagnosis Date   Anemia    Aortic stenosis 05/2022   Arthritis    knees   Atrial fibrillation ([place])    Bilateral renal cysts 03/23/2018   Noted MRI ABD   CAD (coronary artery disease), native coronary artery 06/03/2022   s/p PCI mLAD   Cancer [facility])    right kidney cancer   Cancer ([place])    pancreatic   Cholelithiasis 03/19/2018   noted on CT AB/Pelvis   Diabetes mellitus without complication ([place])    Diverticulosis of colon 04/19/2018   Noted on CT abd/pelvis   Dysrhythmia    afib   ESRD on hemodialysis [facility])    nephrologist-- dr Hollie Salk [name]);  05-01-2018 has not started dialysis, scheduled for AV fistula creation 05-07-2018   GERD (gastroesophageal reflux disease)    Hepatic steatosis 03/19/2018   Mild diffuse, noted on CT AB/Pelvis   History of kidney stones    History of pulmonary embolus (PE) 03/2008   treated with coumadin for 6 months   History of sepsis 04/20/2018   per discharge note , probable UTI   Hypertension    IBS (irritable bowel syndrome)    Nocturia    OSA on CPAP    uses CPAP nightly   Pancreatic lesion    0.4cm cystic per CT 07/ 2019   Peripheral vascular disease ([place])    Pneumonia    x 1   Pulmonary nodule    Solid 59mm Right Lower Lobe   Right renal mass 04/10/2018   new dx--  scheduled for nephrectomy 05-16-2018   S/P ureteral stent placement: 04/16/2018 04/19/2018    Current Outpatient Medications  Medication Sig Dispense Refill   acetaminophen (TYLENOL) 500 MG tablet Take 1,500 mg by mouth every 8 (eight) hours as  needed for moderate pain.     allopurinol (ZYLOPRIM) 100 MG tablet Take 1 tablet (100 mg total) by mouth every evening. 30 tablet 0   atorvastatin (LIPITOR) 40 MG  tablet Take 1 tablet (40 mg total) by mouth every evening.     axitinib (INLYTA) 5 MG tablet Take one tablet daily 60 tablet 0   blood glucose meter kit and supplies Dispense based on patient and insurance preference. Use up to four times daily as directed. (FOR ICD-10 E10.9, E11.9). 1 each 0   diphenhydramine-acetaminophen (TYLENOL PM) 25-500 MG TABS tablet Take 3 tablets by mouth at bedtime as needed (sleep/headache).     fluticasone (FLONASE) 50 MCG/ACT nasal spray Place 1 spray into both nostrils daily as needed for allergies or rhinitis.     loperamide (IMODIUM A-D) 2 MG tablet Take 2 mg by mouth 4 (four) times daily as needed for diarrhea or loose stools.     loratadine (CLARITIN) 10 MG tablet Take 10 mg by mouth every evening.      Menthol, Topical Analgesic, (BIOFREEZE EX) Apply 1 Application topically as needed (pain).     metoprolol succinate (TOPROL XL) 25 MG 24 hr tablet Take 1 tablet (25 mg total) by mouth daily. 30 tablet 6   midodrine (PROAMATINE) 10 MG tablet Take 1 tablet (10 mg total) by mouth 3 (three) times daily as needed (for hypotension/dialysis). AS ordered. Take In the evening 10 mg on Mon. Wed and Friday Take 20 mg on Dialysis days Tues, Thurs and Saturday. 10 mg when he gets there and 10 mg half way through. 270 tablet 3   nitroGLYCERIN (NITROSTAT) 0.4 MG SL tablet Place 1 tablet (0.4 mg total) under the tongue every 5 (five) minutes as needed for chest pain. 14 tablet 12   omeprazole (PRILOSEC OTC) 20 MG tablet Take 40 mg by mouth daily before breakfast.      pembrolizumab (KEYTRUDA) 100 MG/4ML SOLN Inject into the vein every 6 (six) weeks. Infused at providers office     predniSONE (DELTASONE) 10 MG tablet Take 3 tablets (30 mg total) by mouth daily with breakfast. 90 tablet 1   prochlorperazine (COMPAZINE) 10  MG tablet Take 1 tablet (10 mg total) by mouth every 6 (six) hours as needed for nausea or vomiting. 60 tablet 1   Sennosides (SENOKOT EXTRA STRENGTH) 17.2 MG TABS Take 51.6 mg by mouth at bedtime as needed (constipation).     traMADol (ULTRAM) 50 MG tablet TAKE 1 TABLET BY MOUTH EVERY 6 HOURS AS NEEDED FOR PAIN 30 tablet 0   warfarin (COUMADIN) 3 MG tablet TAKE 1 TO 2 TABLETS BY MOUTH ONCE DAILY AS DIRECTED BY THE COUMADIN CLINIC 90 tablet 0   clopidogrel (PLAVIX) 75 MG tablet Take 1 tablet (75 mg total) by mouth daily with breakfast. (Patient not taking: Reported on 10/10/2022) 120 tablet 0   ferric citrate (AURYXIA) 1 GM 210 MG(Fe) tablet Take 1 tablet (210 mg total) by mouth daily with supper. 270 tablet    glucose blood (ACCU-CHEK GUIDE) test strip USE 1 STRIP TO CHECK FASTING BLOOD SUGAR AND 2 HOURS AFTER LARGEST MEAL 100 each 0   No current facility-administered medications for this encounter.    Vitals:   10/10/22 0907  BP: (!) 90/50  Pulse: (!) 142  SpO2: 95%    PHYSICAL EXAM:  General:  Ill appearing. Tachypneic. Diaphoretic HEENT: normal Neck: supple. no JVD. Carotids 2+ bilat; no bruits. No lymphadenopathy or thryomegaly appreciated. Cor: PMI nondisplaced. Regular. Tachy 2/6 AS Lungs: clear Abdomen: soft, nontender, + distended. No hepatosplenomegaly. No bruits or masses. Good bowel sounds. Extremities: no cyanosis, clubbing, rash, edema Neuro: alert & orientedx3, cranial nerves grossly intact.  moves all 4 extremities w/o difficulty. Affect pleasant   ECG: Sinus tach 143 No ST-T wave abnormalities. Personally reviewed   ASSESSMENT & PLAN:    1. Hypotensions, weakness, diarrhea and diaphoresis - He looks quite ill on exam with marked sinus tachycardia - Suspect sepsis/volume depletion with possible acidosis - Will send to ER for IVF, blood work and w/u for underlying etiology (including thyroid panel). At high risk for infection with high-dose prednisone - If  infected will need stress-dose steroids  2. PAF - off amio with pulmonary toxicity - In sinus tach today - Continue warfarin   3. Amiodarone lung toxicity - symptoms improved with prednisone - recheck ESR - needs slow taper of steroids (6-12 months) - Likely needs stress-dose steroids  4. High output HF - he has classic signs and symptoms of progressive high-output HF with high output state on RHC - RHC showed improvement with AVF compression but suspect symptoms more likely related to amio toxicity and not high-output HF  4. CAD  - LHC 60% mLAD, RFR positive suggesting hemodynamically significant lesion. S/p PCI + DES - residual 70% mid 1st Diag and 75% prox 2nd Diag management medically - DAPT w/ ASA + Plavix - on ? blocker + high intensity statin  - No s/s angina   5. Aortic Stenosis - mild-mod on echo, mGradient 18.0 mmHg. EF 55-60% - will need annual echocardiograms for surveillance   6. ESRD - on HD T/Th/Sat - per nephrology    7. RCC w/ lung Mets - followed by Dr. Alen Blew      Rapid response paged to transport patient to ER. Total time spent 45 minutes seeing patient and coordinating care.   Glori Bickers, MD  9:11 AM

## 2022-10-10 NOTE — ED Provider Notes (Signed)
Patient seen by pulmonary medicine okay for discharge home.  They requested that he fill out DNR paperwork they had a long conversation with he and his wife and they are in agreement to do that.  Patient has dialysis tomorrow.  DNR paperwork provided.  Patient will complete it at home.  Today's visit marked as a DNR patient.   Fredia Sorrow, MD 10/10/22 1725

## 2022-10-10 NOTE — ED Triage Notes (Signed)
Patient presents to ed from the heart failure clinic , states he is sob and c/o chest soreness from breathing so hard. Patient is a dialysis patient left arm restricted. States he had full dialysis on Sat of which normally they take off 3 liters however they took off 4 Sat. States he has become more sob over the last several days. States he is able to walk short distant in his house.

## 2022-10-11 ENCOUNTER — Telehealth (HOSPITAL_COMMUNITY): Payer: Self-pay

## 2022-10-13 NOTE — Telephone Encounter (Signed)
[facility] [employer] called again this morning.  They stated they need death certificate signed so they can do cremation for service this weekend.   He said it should be set up in system.

## 2022-10-28 NOTE — Telephone Encounter (Signed)
Janace Hoard called from [facility] and stated Mr. George Lewis passed away yesterday. He is setting everything up in the system for you to go in and sign certificate.

## 2022-10-28 DEATH — deceased

## 2022-10-31 ENCOUNTER — Inpatient Hospital Stay: Payer: Medicare Other

## 2022-10-31 ENCOUNTER — Inpatient Hospital Stay: Payer: Medicare Other | Admitting: Internal Medicine

## 2022-11-11 ENCOUNTER — Ambulatory Visit: Payer: Medicare Other | Admitting: Internal Medicine

## 2023-07-19 NOTE — Telephone Encounter (Signed)
Telephone call
